# Patient Record
Sex: Male | Born: 1955 | Race: Black or African American | Hispanic: No | Marital: Single | Smoking: Former smoker
Health system: Southern US, Community
[De-identification: ages and names within clinical notes are randomized; demographics above are authoritative.]

## PROBLEM LIST (undated history)

## (undated) DIAGNOSIS — F209 Schizophrenia, unspecified: Secondary | ICD-10-CM

## (undated) DIAGNOSIS — E119 Type 2 diabetes mellitus without complications: Secondary | ICD-10-CM

## (undated) DIAGNOSIS — J45909 Unspecified asthma, uncomplicated: Secondary | ICD-10-CM

## (undated) DIAGNOSIS — E1101 Type 2 diabetes mellitus with hyperosmolarity with coma: Secondary | ICD-10-CM

## (undated) DIAGNOSIS — M109 Gout, unspecified: Secondary | ICD-10-CM

## (undated) DIAGNOSIS — D649 Anemia, unspecified: Secondary | ICD-10-CM

## (undated) DIAGNOSIS — I10 Essential (primary) hypertension: Secondary | ICD-10-CM

## (undated) DIAGNOSIS — E785 Hyperlipidemia, unspecified: Secondary | ICD-10-CM

## (undated) HISTORY — PX: TOE SURGERY: SHX1073

## (undated) HISTORY — DX: Type 2 diabetes mellitus without complications: E11.9

## (undated) HISTORY — PX: REPLACEMENT TOTAL KNEE: SUR1224

## (undated) HISTORY — DX: Type 2 diabetes mellitus with hyperosmolarity with coma: E11.01

## (undated) HISTORY — DX: Unspecified asthma, uncomplicated: J45.909

---

## 2016-05-26 ENCOUNTER — Ambulatory Visit (INDEPENDENT_AMBULATORY_CARE_PROVIDER_SITE_OTHER): Payer: Medicaid Other | Admitting: Family Medicine

## 2016-05-26 ENCOUNTER — Encounter: Payer: Self-pay | Admitting: Family Medicine

## 2016-05-26 VITALS — BP 124/84 | HR 82 | Temp 98.0°F | Ht 74.0 in | Wt 236.0 lb

## 2016-05-26 DIAGNOSIS — Z87898 Personal history of other specified conditions: Secondary | ICD-10-CM

## 2016-05-26 DIAGNOSIS — J4540 Moderate persistent asthma, uncomplicated: Secondary | ICD-10-CM

## 2016-05-26 DIAGNOSIS — Z720 Tobacco use: Secondary | ICD-10-CM | POA: Insufficient documentation

## 2016-05-26 DIAGNOSIS — N189 Chronic kidney disease, unspecified: Secondary | ICD-10-CM | POA: Diagnosis not present

## 2016-05-26 DIAGNOSIS — E1140 Type 2 diabetes mellitus with diabetic neuropathy, unspecified: Secondary | ICD-10-CM | POA: Insufficient documentation

## 2016-05-26 DIAGNOSIS — F1911 Other psychoactive substance abuse, in remission: Secondary | ICD-10-CM | POA: Insufficient documentation

## 2016-05-26 DIAGNOSIS — F1011 Alcohol abuse, in remission: Secondary | ICD-10-CM | POA: Insufficient documentation

## 2016-05-26 DIAGNOSIS — I10 Essential (primary) hypertension: Secondary | ICD-10-CM | POA: Insufficient documentation

## 2016-05-26 DIAGNOSIS — E1139 Type 2 diabetes mellitus with other diabetic ophthalmic complication: Secondary | ICD-10-CM

## 2016-05-26 DIAGNOSIS — D509 Iron deficiency anemia, unspecified: Secondary | ICD-10-CM | POA: Insufficient documentation

## 2016-05-26 DIAGNOSIS — J4530 Mild persistent asthma, uncomplicated: Secondary | ICD-10-CM | POA: Insufficient documentation

## 2016-05-26 LAB — CBC WITH DIFFERENTIAL/PLATELET
BASOS ABS: 57 {cells}/uL (ref 0–200)
Basophils Relative: 1 %
EOS PCT: 2 %
Eosinophils Absolute: 114 cells/uL (ref 15–500)
HCT: 37.1 % — ABNORMAL LOW (ref 38.5–50.0)
HEMOGLOBIN: 11.9 g/dL — AB (ref 13.2–17.1)
LYMPHS ABS: 2850 {cells}/uL (ref 850–3900)
LYMPHS PCT: 50 %
MCH: 28.7 pg (ref 27.0–33.0)
MCHC: 32.1 g/dL (ref 32.0–36.0)
MCV: 89.6 fL (ref 80.0–100.0)
MPV: 9.9 fL (ref 7.5–12.5)
Monocytes Absolute: 456 cells/uL (ref 200–950)
Monocytes Relative: 8 %
NEUTROS PCT: 39 %
Neutro Abs: 2223 cells/uL (ref 1500–7800)
Platelets: 268 10*3/uL (ref 140–400)
RBC: 4.14 MIL/uL — AB (ref 4.20–5.80)
RDW: 14.1 % (ref 11.0–15.0)
WBC: 5.7 10*3/uL (ref 3.8–10.8)

## 2016-05-26 LAB — COMPLETE METABOLIC PANEL WITH GFR
ALBUMIN: 3.9 g/dL (ref 3.6–5.1)
ALK PHOS: 39 U/L — AB (ref 40–115)
ALT: 15 U/L (ref 9–46)
AST: 18 U/L (ref 10–35)
BUN: 19 mg/dL (ref 7–25)
CHLORIDE: 109 mmol/L (ref 98–110)
CO2: 24 mmol/L (ref 20–31)
Calcium: 9.7 mg/dL (ref 8.6–10.3)
Creat: 1.22 mg/dL (ref 0.70–1.33)
GFR, EST NON AFRICAN AMERICAN: 64 mL/min (ref >=60)
GFR, Est African American: 75 mL/min (ref >=60)
GLUCOSE: 91 mg/dL (ref 65–99)
POTASSIUM: 5 mmol/L (ref 3.5–5.3)
SODIUM: 141 mmol/L (ref 135–146)
Total Bilirubin: 0.3 mg/dL (ref 0.2–1.2)
Total Protein: 6.9 g/dL (ref 6.1–8.1)

## 2016-05-26 LAB — POCT GLYCOSYLATED HEMOGLOBIN (HGB A1C): HEMOGLOBIN A1C: 9.7

## 2016-05-26 MED ORDER — DAILY MULTIPLE VITAMINS PO TABS: 1.0000 | ORAL_TABLET | Freq: Every day | ORAL | 11 refills | Status: DC

## 2016-05-26 MED ORDER — FERROUS SULFATE 325 (65 FE) MG PO TABS: 325.0000 mg | ORAL_TABLET | Freq: Every day | ORAL | 3 refills | Status: DC

## 2016-05-26 MED ORDER — MONTELUKAST SODIUM 10 MG PO TABS: 10.0000 mg | ORAL_TABLET | Freq: Every day | ORAL | 3 refills | Status: DC

## 2016-05-26 MED ORDER — RAMIPRIL 5 MG PO CAPS
5.0000 mg | ORAL_CAPSULE | Freq: Every day | ORAL | 3 refills | Status: DC
Start: 2016-05-26 — End: 2016-07-19

## 2016-05-26 MED ORDER — FREESTYLE LANCETS MISC: 12 refills | Status: DC

## 2016-05-26 MED ORDER — OMEPRAZOLE 20 MG PO CPDR: 20.0000 mg | DELAYED_RELEASE_CAPSULE | Freq: Every day | ORAL | 3 refills | Status: DC

## 2016-05-26 MED ORDER — CETIRIZINE HCL 10 MG PO TABS: 10.0000 mg | ORAL_TABLET | Freq: Every day | ORAL | 11 refills | Status: DC

## 2016-05-26 MED ORDER — GLIMEPIRIDE 4 MG PO TABS: 4.0000 mg | ORAL_TABLET | Freq: Two times a day (BID) | ORAL | 3 refills | Status: DC

## 2016-05-26 MED ORDER — AMLODIPINE BESYLATE 5 MG PO TABS: 5.0000 mg | ORAL_TABLET | Freq: Every day | ORAL | 3 refills | Status: DC

## 2016-05-26 MED ORDER — ATORVASTATIN CALCIUM 20 MG PO TABS: 20.0000 mg | ORAL_TABLET | Freq: Every day | ORAL | 3 refills | Status: DC

## 2016-05-26 MED ORDER — GLUCOSE BLOOD VI STRP: ORAL_STRIP | 12 refills | Status: DC

## 2016-05-26 MED ORDER — ASPIRIN EC 81 MG PO TBEC: 81.0000 mg | DELAYED_RELEASE_TABLET | Freq: Every day | ORAL | 3 refills | Status: DC

## 2016-05-26 NOTE — Progress Notes (Signed)
    Subjective:    Patient ID: Dylan Jordan, male    DOB: 05-05-1956, 61 y.o.   MRN: [id number]   CC: here to establish care, needs refills  Doing well overall. Has chronic back pain. Recently moved here end of December. Sees [facility] for mental health issues related to past substance abuse. Sees them next week. They fill his psych medications.  Has diabetes and take sugars twice a day, used to be on insulin but not anymore. Sugars have been well controlled (130's) for past several weeks. They were not well controlled during holidays. He has poor vision related to diabetes and saw optometry and ophthalmology in [place] for this. He is unsure if his kidneys have been affected by DM but thinks his creatinine may be elevated. His A1C in past has been 8 or 9. He has some numbness and tingling in feet.   PMH- diabetes, HTN, anemia, HTN, asthma, alcohol abuse, hx of substance abuse meds- see med list (extensive) allg- seasonal, soybeans? Pineapple, chocolate Surg Hx- left knee replacement, right toe spur SH- smokes, no alcohol (5 years sober), hx of drug abuse- cocaine, THC, psychedelics. lives with niece who is nurse FH- Dm, heart disease  Smoking status reviewed- 3/4 pack a day, has smoked for 41 years.  Review of Systems- no fevers or chills   Objective:  BP 124/84   Pulse 82   Temp 98 F (36.7 C) (Oral)   Ht 6\' 2"  (1.88 m)   Wt 236 lb (107 kg)   BMI 30.30 kg/m  Vitals and nursing note reviewed  General: well nourished, in no acute distress Neck: supple, non-tender, without lymphadenopathy Cardiac: RRR, clear S1 and S2, no murmurs, rubs, or gallops Respiratory: clear to auscultation bilaterally, no increased work of breathing Abdomen: obese abdomen. Soft, nontender, nondistended, +BS Extremities: no edema or cyanosis. Warm, well perfused.  Neuro: alert and oriented, speech is difficult to understand at times, slow speech, answers questions appropriately.    Assessment & Plan:     HTN (hypertension) BP well controlled today.  -refilled norvasc 5 mg -follow up 2-3 weeks  Type 2 diabetes mellitus with diabetic neuropathy (HCC)  On glimepiride 4mg  BID. Sugars appear well controlled. None over 200. None under 100.  -refilled glimepiride -check A1C today -follow up 2-3 weeks -will refer to eye doctor  Iron deficiency anemia  Has been taking iron daily for several years.  -CBC today -refilled iron -follow up 2-3 weeks  Refilled all other medications. Will follow up on other chronic medical conditions at next visit. Return in about 3 weeks (around 06/16/2016).   Dylan PattyAngela Stalin Gruenberg, DO Family Medicine Resident PGY-1

## 2016-05-26 NOTE — Assessment & Plan Note (Signed)
  Has been taking iron daily for several years.  -CBC today -refilled iron -follow up 2-3 weeks

## 2016-05-26 NOTE — Assessment & Plan Note (Addendum)
  On glimepiride 4mg  BID. Sugars appear well controlled. None over 200. None under 100.  -refilled glimepiride -check A1C today -follow up 2-3 weeks -will refer to eye doctor

## 2016-05-26 NOTE — Patient Instructions (Signed)
  It was great meeting you today!  I refilled your medications. I'd like to see you back in 3 weeks to follow up on lab tests.  We will check your blood counts, electrolytes, kidney function, and A1C today.  Please call with any questions or concerns.

## 2016-05-26 NOTE — Assessment & Plan Note (Signed)
BP well controlled today.  -refilled norvasc 5 mg -follow up 2-3 weeks

## 2016-05-26 NOTE — Progress Notes (Deleted)
    Subjective:    Patient ID: Parthenia Ameshilip Kelleher, male    DOB: 06-Aug-1955, 61 y.o.   MRN: [id number]   CC: here to establish   HPI:   Smoking status reviewed  Review of Systems   Objective:  BP 124/84   Pulse 82   Temp 98 F (36.7 C) (Oral)   Ht 6\' 2"  (1.88 m)   Wt 236 lb (107 kg)   BMI 30.30 kg/m  Vitals and nursing note reviewed  General: well nourished, in no acute distress HEENT: normocephalic, TM's visualized bilaterally, no scleral icterus or conjunctival pallor, no nasal discharge, moist mucous membranes, good dentition without erythema or discharge noted in posterior oropharynx Neck: supple, non-tender, without lymphadenopathy Cardiac: RRR, clear S1 and S2, no murmurs, rubs, or gallops Respiratory: clear to auscultation bilaterally, no increased work of breathing Abdomen: soft, nontender, nondistended, no masses or organomegaly. Bowel sounds present Extremities: no edema or cyanosis. Warm, well perfused. 2+ radial and PT pulses bilaterally Skin: warm and dry, no rashes noted Neuro: alert and oriented, no focal deficits   Assessment & Plan:    No problem-specific Assessment & Plan notes found for this encounter.    Return in about 3 weeks (around 06/16/2016).   [name], DO Family Medicine Resident PGY-1

## 2016-05-29 ENCOUNTER — Telehealth: Payer: Self-pay | Admitting: *Deleted

## 2016-05-29 NOTE — Telephone Encounter (Signed)
[facility] stating patient's insurance [employer](Medicaid) will cover Accu-check Aviva Plus testing supplies. Clovis PuMartin, Zayley Arras L, RN

## 2016-05-30 ENCOUNTER — Other Ambulatory Visit: Payer: Self-pay | Admitting: Family Medicine

## 2016-05-30 MED ORDER — ACCU-CHEK AVIVA PLUS W/DEVICE KIT: PACK | 0 refills | Status: DC

## 2016-05-30 MED ORDER — GLUCOSE BLOOD VI STRP: ORAL_STRIP | 12 refills | Status: DC

## 2016-05-30 MED ORDER — ACCU-CHEK FASTCLIX LANCETS MISC: 12 refills | Status: DC

## 2016-06-09 LAB — HM DIABETES EYE EXAM

## 2016-06-12 ENCOUNTER — Ambulatory Visit: Payer: Self-pay | Admitting: Family Medicine

## 2016-06-14 ENCOUNTER — Ambulatory Visit (INDEPENDENT_AMBULATORY_CARE_PROVIDER_SITE_OTHER): Payer: Medicaid Other | Admitting: Family Medicine

## 2016-06-14 ENCOUNTER — Encounter: Payer: Self-pay | Admitting: Family Medicine

## 2016-06-14 VITALS — BP 130/90 | HR 74 | Temp 98.1°F | Ht 74.0 in | Wt 233.0 lb

## 2016-06-14 DIAGNOSIS — E1140 Type 2 diabetes mellitus with diabetic neuropathy, unspecified: Secondary | ICD-10-CM | POA: Diagnosis present

## 2016-06-14 DIAGNOSIS — Z720 Tobacco use: Secondary | ICD-10-CM

## 2016-06-14 DIAGNOSIS — F1994 Other psychoactive substance use, unspecified with psychoactive substance-induced mood disorder: Secondary | ICD-10-CM | POA: Diagnosis not present

## 2016-06-14 MED ORDER — NICOTINE 21 MG/24HR TD PT24: 21.0000 mg | MEDICATED_PATCH | Freq: Every day | TRANSDERMAL | 3 refills | Status: DC

## 2016-06-14 MED ORDER — METFORMIN HCL 500 MG PO TABS: 1000.0000 mg | ORAL_TABLET | Freq: Two times a day (BID) | ORAL | 3 refills | Status: DC

## 2016-06-14 NOTE — Assessment & Plan Note (Addendum)
  Patient reluctant to add insulin at this time. He would prefer to work on improving his diet.   -continue metformin and amaryl for now -recheck A1C in 3 months, patient agreed to add medication at that time if it does not improve -information given about dietary changes he can work on  -referral to podiatry given

## 2016-06-14 NOTE — Patient Instructions (Signed)
  It was good seeing you today!   [facility].  Please come back in 3 months and we can talk more about your diabetes control.  In the meantime, try to work on your diet.   Diet Recommendations for Diabetes   Starchy (carb) foods: Bread, rice, pasta, potatoes, corn, cereal, grits, crackers, bagels, muffins, all baked goods.  (Fruits, milk, and yogurt also have carbohydrate, but most of these foods will not spike your blood sugar as the starchy foods will.)  A few fruits do cause high blood sugars; use small portions of bananas (limit to 1/2 at a time), grapes, watermelon, oranges, and most tropical fruits.    Protein foods: Meat, fish, poultry, eggs, dairy foods, and beans such as pinto and kidney beans (beans also provide carbohydrate).   1. Eat at least 3 meals and 1-2 snacks per day. Never go more than 4-5 hours while awake without eating. Eat breakfast within the first hour of getting up.   2. Limit starchy foods to TWO per meal and ONE per snack. ONE portion of a starchy  food is equal to the following:   - ONE slice of bread (or its equivalent, such as half of a hamburger bun).   - 1/2 cup of a "scoopable" starchy food such as potatoes or rice.   - 15 grams of carbohydrate as shown on food label.  3. Include at every meal: a protein food, a carb food, and vegetables and/or fruit.   - Obtain twice the volume of veg's as protein or carbohydrate foods for both lunch and dinner.   - Fresh or frozen veg's are best.   - Keep frozen veg's on hand for a quick vegetable serving.

## 2016-06-14 NOTE — Progress Notes (Signed)
    Subjective:    Patient ID: Parthenia Ameshilip Gerding, male    DOB: Mar 25, 1956, 61 y.o.   MRN: [id number]   CC: here for follow up on DM  DM Has been taking metformin and amaryl as directed every day. Trying to eat healthier.  A1C was 9.7. He said his numbers have been below 200 consistently. Had a low of 50 one night. He woke up feeling shaky and off. He is hesitant to adding more medication at this time to improve A1C. He had been on insulin previously and hated sticking himself. He would prefer to continue trying to improve his diet. Needs referral to podiatry, saw one frequently in [place] for diabetic foot check ups/callous removal/nail trimming.  Tobacco abuse Currently trying to quit, using nicotine patches to quit. 2 Cigarettes in past 3 weeks. Has been smoking 1-1.5 packs per day for 41 years. He lives with his niece who is also trying to quit. Has had cough with white sputum recently. No fevers. No body aches, chills. Has been taking mucinex with relief. Coughing more at night. Feels it may be related to him quitting smoking and "bringing up all the carcinogens"   Review of Systems- see HPI   Objective:  BP 130/90 (BP Location: Left Arm, Patient Position: Sitting, Cuff Size: Normal)   Pulse 74   Temp 98.1 F (36.7 C) (Oral)   Ht 6\' 2"  (1.88 m)   Wt 233 lb (105.7 kg)   SpO2 97%   BMI 29.92 kg/m  Vitals and nursing note reviewed  General: well nourished, in no acute distress Neck: supple, non-tender, without lymphadenopathy Cardiac: RRR, clear S1 and S2, no murmurs, rubs, or gallops Respiratory: clear to auscultation bilaterally, no increased work of breathing Abdomen: obese abdomen, non-tender, non-distended, +BS Extremities: no edema or cyanosis Neuro: alert and oriented, no focal deficits Psych: speech garbled at times, talkative, good eye contact.    Assessment & Plan:    Drug-induced mood disorder (HCC)  Sees [name] for psych meds refill  -[facility] requested lithium and  depakote level today, will order and fax them results -he has appt with them end of February  Type 2 diabetes mellitus with diabetic neuropathy (HCC)  Patient reluctant to add insulin at this time. He would prefer to work on improving his diet.   -continue metformin and amaryl for now -recheck A1C in 3 months, patient agreed to add medication at that time if it does not improve -information given about dietary changes he can work on  -referral to podiatry given  Tobacco abuse  Currently using patch to quit with a lot of success  -patient interested in seeing Dr. Raymondo BandKoval for help with smoking cessation -sent in Rx for nicotine patches     Return in about 3 months (around 09/11/2016).   Dolores PattyAngela Kaetlin Bullen, DO Family Medicine Resident PGY-1

## 2016-06-14 NOTE — Assessment & Plan Note (Signed)
  Sees [name] for psych meds refill  -[facility] requested lithium and depakote level today, will order and fax them results -he has appt with them end of February

## 2016-06-14 NOTE — Assessment & Plan Note (Signed)
  Currently using patch to quit with a lot of success  -patient interested in seeing Dr. Raymondo BandKoval for help with smoking cessation -sent in Rx for nicotine patches

## 2016-06-15 LAB — LITHIUM LEVEL: Lithium Lvl: 0.8 mmol/L (ref 0.6–1.2)

## 2016-06-15 LAB — VALPROIC ACID LEVEL: Valproic Acid Lvl: 50.6 ug/mL (ref 50.0–100.0)

## 2016-06-16 ENCOUNTER — Telehealth: Payer: Self-pay | Admitting: Family Medicine

## 2016-06-16 NOTE — Telephone Encounter (Signed)
The lancets prescribed were not the correct type.  He doesn't know what type he has.  He has the correct test strips and glucometer.  Please advise

## 2016-06-17 ENCOUNTER — Other Ambulatory Visit: Payer: Self-pay | Admitting: Family Medicine

## 2016-06-17 MED ORDER — ACCU-CHEK SOFT TOUCH LANCETS MISC: 12 refills | Status: DC

## 2016-06-30 ENCOUNTER — Encounter: Payer: Self-pay | Admitting: Student

## 2016-06-30 ENCOUNTER — Ambulatory Visit (INDEPENDENT_AMBULATORY_CARE_PROVIDER_SITE_OTHER): Payer: Medicaid Other | Admitting: Student

## 2016-06-30 DIAGNOSIS — R053 Chronic cough: Secondary | ICD-10-CM | POA: Insufficient documentation

## 2016-06-30 DIAGNOSIS — R05 Cough: Secondary | ICD-10-CM | POA: Insufficient documentation

## 2016-06-30 MED ORDER — OMEPRAZOLE 20 MG PO CPDR: 40.0000 mg | DELAYED_RELEASE_CAPSULE | Freq: Every day | ORAL | 1 refills | Status: DC

## 2016-06-30 NOTE — Progress Notes (Signed)
   Subjective:    Patient ID: Dylan Jordan, male    DOB: 1955/07/02, 61 y.o.   MRN: [id number]   CC: chronic cough  HPI: 61 y/o M presents for chronic cough  Cough - present for " years" - occasionally productive of phlegn and food particles - he reports a history of GERD and takes protonix for this - denies SOB or chest pain - denies fever - denies hemoptysis  Smoking status reviewed Current smoker but trying to quit with the patch  Review of Systems  Per HPI N/V/D, weakness    Objective:  BP 122/70   Pulse 83   Temp 98.1 F (36.7 C) (Oral)   Ht 6\' 2"  (1.88 m)   Wt 237 lb (107.5 kg)   SpO2 96%   BMI 30.43 kg/m  Vitals and nursing note reviewed  General: NAD Cardiac: RRR Respiratory: CTAB, normal effortt Extremities: no edema or cyanosis. WWP. Skin: warm and dry, no rashes noted Neuro: alert and oriented, no focal deficits   Assessment & Plan:    Chronic cough Given history of GERD, cough is likely due to reflux disease. No fevers, SOB and hemoptysis makes infectious cause less likely. He is currently on Protonic 20 and while he did not take it yesterday, he does feel he typically takes it as directed - will increase protonix to 40 qD - follow with PCP    Shiquita Collignon A. Kennon RoundsHaney MD, MS Family Medicine Resident PGY-3 Pager [phone]

## 2016-06-30 NOTE — Patient Instructions (Signed)
Follow up in 2 weeks for cough If you continue to have questions or concerns, call the office at [phone]

## 2016-06-30 NOTE — Assessment & Plan Note (Addendum)
Given history of GERD, cough is likely due to reflux disease. No fevers, SOB and hemoptysis makes infectious cause less likely. He is currently on Protonic 20 and while he did not take it yesterday, he does feel he typically takes it as directed - will increase protonix to 40 qD - follow with PCP

## 2016-07-05 ENCOUNTER — Other Ambulatory Visit: Payer: Self-pay | Admitting: *Deleted

## 2016-07-05 MED ORDER — ACCU-CHEK FASTCLIX LANCETS MISC: 12 refills | Status: DC

## 2016-07-05 MED ORDER — ACCU-CHEK SOFTCLIX LANCET DEV KIT: PACK | 0 refills | Status: DC

## 2016-07-05 NOTE — Progress Notes (Unsigned)
Patient called needing lancet device sent to pharmacy.  Accu-chek softclix lancet device sent to pharmacy.  Clovis PuMartin, Tamika L, RN

## 2016-07-05 NOTE — Telephone Encounter (Signed)
Pt states he lost the lancet and needs a new one as soon as possible. Funds are tight and pt is hoping to get it for free or small cost. ep

## 2016-07-05 NOTE — Telephone Encounter (Signed)
Pt called again and [facility] [address] ep

## 2016-07-07 ENCOUNTER — Other Ambulatory Visit: Payer: Self-pay | Admitting: Family Medicine

## 2016-07-10 ENCOUNTER — Ambulatory Visit: Payer: Medicaid Other | Admitting: Podiatry

## 2016-07-12 ENCOUNTER — Ambulatory Visit: Payer: Medicaid Other | Admitting: Podiatry

## 2016-07-14 ENCOUNTER — Other Ambulatory Visit: Payer: Self-pay | Admitting: Family Medicine

## 2016-07-14 MED ORDER — DAILY MULTIPLE VITAMINS PO TABS: 1.0000 | ORAL_TABLET | Freq: Every day | ORAL | 11 refills | Status: DC

## 2016-07-14 MED ORDER — ATORVASTATIN CALCIUM 20 MG PO TABS: 20.0000 mg | ORAL_TABLET | Freq: Every day | ORAL | 3 refills | Status: DC

## 2016-07-14 MED ORDER — AMLODIPINE BESYLATE 5 MG PO TABS: 5.0000 mg | ORAL_TABLET | Freq: Every day | ORAL | 3 refills | Status: DC

## 2016-07-14 NOTE — Telephone Encounter (Signed)
Will forward to MD.  Patient states that he will not take the ramipril until he sees his provider on 07-19-16. Kristine Tiley,CMA

## 2016-07-14 NOTE — Telephone Encounter (Signed)
Pt called because he is having a lot of coughing, breathing and gagging issues with the Ramipril. He is not going to take the Ramipril until he speaks to someone from our office. He also needs refills on his Lipitor, Amlodipine, Vitamin D , and his Daily Multi-vitamins. Please call so that the patient know what to do about the Ramipril. jw

## 2016-07-14 NOTE — Telephone Encounter (Signed)
Refilled medications. Will discuss ramipril at upcoming visit. Ok with him not taking this in the meantime. Thanks

## 2016-07-14 NOTE — Telephone Encounter (Signed)
Pt is continuing to have a dry cough.  His niece who has her masters in nurseing suggested ramipril may be causing his cough. He has an appt with ricco on wed. Should he continue to take this medicine?  Please advise

## 2016-07-14 NOTE — Telephone Encounter (Signed)
Will forward to MD. Glenn Gullickson,CMA  

## 2016-07-17 ENCOUNTER — Ambulatory Visit (INDEPENDENT_AMBULATORY_CARE_PROVIDER_SITE_OTHER): Payer: Medicaid Other | Admitting: Podiatry

## 2016-07-17 DIAGNOSIS — L603 Nail dystrophy: Secondary | ICD-10-CM

## 2016-07-17 DIAGNOSIS — L608 Other nail disorders: Secondary | ICD-10-CM

## 2016-07-17 DIAGNOSIS — M79676 Pain in unspecified toe(s): Secondary | ICD-10-CM | POA: Diagnosis not present

## 2016-07-17 DIAGNOSIS — B351 Tinea unguium: Secondary | ICD-10-CM | POA: Diagnosis not present

## 2016-07-17 DIAGNOSIS — E0843 Diabetes mellitus due to underlying condition with diabetic autonomic (poly)neuropathy: Secondary | ICD-10-CM | POA: Diagnosis not present

## 2016-07-17 DIAGNOSIS — M79609 Pain in unspecified limb: Principal | ICD-10-CM

## 2016-07-17 NOTE — Progress Notes (Signed)
   SUBJECTIVE Patient with a history of diabetes mellitus presents to office today complaining of elongated, thickened nails. Pain while ambulating in shoes. Patient is unable to trim their own nails.   OBJECTIVE General Patient is awake, alert, and oriented x 3 and in no acute distress. Derm Skin is dry and supple bilateral. Negative open lesions or macerations. Remaining integument unremarkable. Nails are tender, long, thickened and dystrophic with subungual debris, consistent with onychomycosis, 1-5 bilateral. No signs of infection noted. Vasc  DP and PT pedal pulses palpable bilaterally. Temperature gradient within normal limits.  Neuro Epicritic and protective threshold sensation diminished bilaterally.  Musculoskeletal Exam No symptomatic pedal deformities noted bilateral. Muscular strength within normal limits.  ASSESSMENT 1. Diabetes Mellitus w/ peripheral neuropathy 2. Onychomycosis of nail due to dermatophyte bilateral 3. Pain in foot bilateral  PLAN OF CARE 1. Patient evaluated today. 2. Instructed to maintain good pedal hygiene and foot care. Stressed importance of controlling blood sugar.  3. Mechanical debridement of nails 1-5 bilaterally performed using a nail nipper. Filed with dremel without incident.  4. Return to clinic in 3 mos.     Lynnet Hefley M. Toba Claudio, DPM [facility]  Dr. Breuna Loveall M. Andreah Goheen, DPM    [address]                                        [place], [place] [address]                Office [phone]  Fax [phone]       

## 2016-07-18 ENCOUNTER — Telehealth: Payer: Self-pay | Admitting: *Deleted

## 2016-07-18 ENCOUNTER — Ambulatory Visit: Payer: Medicaid Other | Admitting: Podiatry

## 2016-07-18 NOTE — Telephone Encounter (Addendum)
Pt states he mentioned a cream to Dr. Logan BoresEvans Ammonium Lactate 2%, he would like a large tube with directions to apply to feet twice daily, pt's new pharmacy phone is [phone]. 07/25/2016-I informed pt of Dr. Logan BoresEvans recommendation, pt states he wants a lotion Medicaid pays for.07/26/2016-The ammonium lactate % that pt states is not in the formulary of [employer]. I asked the % of the cream and he states 12%, I told him [facility]

## 2016-07-19 ENCOUNTER — Encounter: Payer: Self-pay | Admitting: Licensed Clinical Social Worker

## 2016-07-19 ENCOUNTER — Telehealth: Payer: Self-pay | Admitting: *Deleted

## 2016-07-19 ENCOUNTER — Encounter: Payer: Self-pay | Admitting: Family Medicine

## 2016-07-19 ENCOUNTER — Ambulatory Visit (INDEPENDENT_AMBULATORY_CARE_PROVIDER_SITE_OTHER): Payer: Medicaid Other | Admitting: Family Medicine

## 2016-07-19 VITALS — BP 122/84 | HR 80 | Temp 97.8°F | Ht 74.0 in | Wt 233.8 lb

## 2016-07-19 DIAGNOSIS — R05 Cough: Secondary | ICD-10-CM | POA: Diagnosis not present

## 2016-07-19 DIAGNOSIS — I10 Essential (primary) hypertension: Secondary | ICD-10-CM | POA: Diagnosis not present

## 2016-07-19 DIAGNOSIS — J4540 Moderate persistent asthma, uncomplicated: Secondary | ICD-10-CM

## 2016-07-19 DIAGNOSIS — R053 Chronic cough: Secondary | ICD-10-CM

## 2016-07-19 MED ORDER — OMEPRAZOLE 20 MG PO CPDR
40.0000 mg | DELAYED_RELEASE_CAPSULE | Freq: Every day | ORAL | 2 refills | Status: DC
Start: 2016-07-19 — End: 2016-09-11

## 2016-07-19 MED ORDER — LOSARTAN POTASSIUM 25 MG PO TABS: 25.0000 mg | ORAL_TABLET | Freq: Every day | ORAL | 1 refills | Status: DC

## 2016-07-19 MED ORDER — FLUTICASONE-SALMETEROL 250-50 MCG/DOSE IN AEPB: 1.0000 | INHALATION_SPRAY | Freq: Two times a day (BID) | RESPIRATORY_TRACT | 3 refills | Status: DC

## 2016-07-19 NOTE — Telephone Encounter (Signed)
Form signed but patient will likely not be covered for this medication. Discussed him needing to try Qvar or equivalent first but he was insistent on getting Advair. Has appointment for PFT's 3/12 and if this is indicative of COPD Dr. Raymondo BandKoval may be able to recommend appropriate therapy. Thanks!

## 2016-07-19 NOTE — Telephone Encounter (Signed)
PA for Advair Diskus is pending per [place] [employer].  Clovis PuMartin, Tamika L, RN

## 2016-07-19 NOTE — Telephone Encounter (Signed)
[facility]. Formulary and PA form placed in provider box for completion. Juluis Fitzsimmons L, RN  

## 2016-07-19 NOTE — Patient Instructions (Addendum)
  It was great seeing you today!  I changed your Ramipril to a medicine called Losartan. This medicine does not have the side of effect of cough. I sent this to your pharmacy.   Continue taking Prilosec 40 mg every night.  Please use your albuterol inhaler as needed and start using Advair twice a day.  You will see Dr. Raymondo BandKoval Monday the 12th at 11:15 am for pulmonary function testing. He will check your blood pressure at this time. This will answer the COPD question for Dylan Jordan.  Continue the great work on quitting smoking!!  I'll see you in 1 month to follow up on the cough and your diabetes. Call me if you need me in the mean time.

## 2016-07-19 NOTE — Assessment & Plan Note (Addendum)
Has had cough for years. Worsening over past 2-3 months. Hx of asthma. Differential includes uncontrolled asthma, drug induced cough, untreated COPD, allergies/post-nasal drip, and GERD.  -Discontinued ramipril -Start ARB- losartan rx sent to pharmacy -Continue taking 40 mg prilosec every night -PFT's scheduled for 3/12 -Continue smoking cessation -referral made to allergist per patient request

## 2016-07-19 NOTE — Assessment & Plan Note (Addendum)
Patient using albuterol without much relief. Requesting resuming Advair. Recommended starting with Qvar but patient was insistent on going for Advair first.  -advair rx sent in -scheduled for PFT's in pharmacy clinic 3/12 -[name] consulted for difficulty affording medications, see Gavin PoundDeborah Moore's note

## 2016-07-19 NOTE — Progress Notes (Addendum)
Parthenia Ameshilip Duan is a 61 y.o. male   [name]:  Community resources. Pt. reports the following concerns : difficultaffording his medication and need for transportation.    Patient has medicaid but states unable to afford the co-pays.  Income is $750 per month.  States he uses Medicaid transportation to his appointments but wanted additional transportation resources.   THE FOLLOW WAS DISCUSSED: community resources, 211 services and Patient being able to pick up his medication if he communicates to the pharmacy that he does not have the co-pay they should give him the medicine.    LCSW provided: Reflective listening  and brochures with [facility] [place] resources  PLAN:  1. Patient will contact 211 for additional community resources as needed.  2. He will also inform the pharmacy when he does not have the co-pay   Sammuel Hineseborah Corben Auzenne, LCSW Licensed Clinical Social Worker [facility]   [phone] 2:33 PM  Warm Hand Off Completed.

## 2016-07-19 NOTE — Progress Notes (Signed)
    Subjective:    Patient ID: Dylan Jordan, male    DOB: [birth year], 61 y.o.   MRN: [id number]   CC: cough  HPI: Has had cough for years. Recently increased protonix to 40 mg daily but no change in cough.  Trying to quit smoking with patch, some days he does not smoke any, some days 1-3 cigarettes. Varies. Has gone whole weeks without any cigarettes. Patch is working well to quell cravings.  He is worried he has COPD as in the past he was told PFTs were abnormal but then repeat testing they were normal. Used to use Advair but this was expensive. He uses albuterol as needed but it has not improved the cough at all. He is having nighttime awakenings almost every night. Coughing causes SOB but it resolves once coughing spell is over.  Concerned ace inhibitor is causing cough. Stopped taking it one week ago ago. Symptoms are persisting but pharmacist told him it should be clearing up in a week.   Worried about his various food allergies that have seemed to be changing over the years plus worsening seasonal allergies.   Denies chest pain, SOB at rest or with exertion, fevers, and chills.   Objective:  BP 122/84   Pulse 80   Temp 97.8 F (36.6 C) (Oral)   Ht 6\' 2"  (1.88 m)   Wt 233 lb 12.8 oz (106.1 kg)   SpO2 99%   BMI 30.02 kg/m  Vitals and nursing note reviewed  General: well nourished, in no acute distress HEENT: normocephalic, no scleral icterus or conjunctival pallor, no nasal discharge, moist mucous membranes, good dentition Cardiac: RRR, clear S1 and S2, no murmurs, rubs, or gallops Respiratory: clear to auscultation bilaterally, no increased work of breathing Abdomen: soft, nontender, nondistended Extremities: no edema or cyanosis. Skin: warm and dry, no rashes noted Neuro: alert and oriented, no focal deficits   Assessment & Plan:    Chronic cough Has had cough for years. Worsening over past 2-3 months. Hx of asthma. Differential includes uncontrolled asthma, drug  induced cough, untreated COPD, allergies/post-nasal drip, and GERD.  -Discontinued ramipril -Start ARB- losartan rx sent to pharmacy -Continue taking 40 mg prilosec every night -PFT's scheduled for 3/12 -Continue smoking cessation -referral made to allergist per patient request   Asthma Patient using albuterol without much relief. Requesting resuming Advair. Recommended starting with Qvar but patient was insistent on going for Advair first.  -advair rx sent in -scheduled for PFT's in pharmacy clinic 3/12 -[name] consulted for difficulty affording medications, see Gavin PoundDeborah Moore's note  HTN (hypertension) BP 122/84 today  -switched ACE to ARB due to cough -check BP at upcoming pharmacy visit -will follow up in 1 month    Return in about 4 weeks (around 08/16/2016).   Dolores PattyAngela Nickola Lenig, DO Family Medicine Resident PGY-1

## 2016-07-19 NOTE — Assessment & Plan Note (Signed)
BP 122/84 today  -switched ACE to ARB due to cough -check BP at upcoming pharmacy visit -will follow up in 1 month

## 2016-07-20 NOTE — Telephone Encounter (Signed)
PA for Advair Diskus was denied via [place] [employer].  Confirmation number: [id number] Charmayne SheerW.  Silva Aamodt L, RN

## 2016-07-24 ENCOUNTER — Encounter: Payer: Self-pay | Admitting: Pharmacist

## 2016-07-24 ENCOUNTER — Ambulatory Visit (INDEPENDENT_AMBULATORY_CARE_PROVIDER_SITE_OTHER): Payer: Medicaid Other | Admitting: Pharmacist

## 2016-07-24 DIAGNOSIS — R05 Cough: Secondary | ICD-10-CM

## 2016-07-24 DIAGNOSIS — E1140 Type 2 diabetes mellitus with diabetic neuropathy, unspecified: Secondary | ICD-10-CM | POA: Diagnosis present

## 2016-07-24 DIAGNOSIS — R053 Chronic cough: Secondary | ICD-10-CM

## 2016-07-24 DIAGNOSIS — Z720 Tobacco use: Secondary | ICD-10-CM

## 2016-07-24 MED ORDER — GLIMEPIRIDE 4 MG PO TABS: 2.0000 mg | ORAL_TABLET | Freq: Two times a day (BID) | ORAL | 3 refills | Status: DC

## 2016-07-24 NOTE — Telephone Encounter (Signed)
No, not specifically. I generally recommend Amlactin Lotion on occasion to patients. This is over the counter.  Thanks, Dr. Logan BoresEvans

## 2016-07-24 NOTE — Assessment & Plan Note (Signed)
Set smoking quit date of March 31st. Continue nicotine 21 mg patch.

## 2016-07-24 NOTE — Progress Notes (Signed)
Patient ID: Dylan Jordan, male   DOB: 18-Jun-1955, 61 y.o.   MRN: [id number] Reviewed: Agree with Dr. Macky LowerKoval's documentation and management.

## 2016-07-24 NOTE — Patient Instructions (Addendum)
Thank you for coming to see us today!  The lung function test that you did today did not indicate that you have COPD. The main goal going forward is to completely quit smoking. You told us that you think you can quit smoking completely by March 31st. At this time we will not add any medications for your breathing.   Going forward you should take one half tablet (2 mg) of Amaryl twice a day instead of taking the previous dose of one tablet (4 mg) twice a day. This change should help reduce the amount of times that you have low blood sugars.

## 2016-07-24 NOTE — Assessment & Plan Note (Signed)
Spirometry reveals near normal lung function with increased lung age likely related to significant smoking history. Set smoking quit date of March 31st. Continue nicotine 21 mg patch. Cough should subside once patient stops smoking and from discontinuation of ramipril. Repeat PFTs in 6 months after patient has quit smoking. Continue albuterol inhaler PRN.

## 2016-07-24 NOTE — Progress Notes (Signed)
   S:    Patient arrives in good spirits ambulating without assist. No breathing difficulties apparent. Presents for lung function evaluation. Patient was referred on 07/19/2016 by Dr. Wonda Oldsiccio.   Patient reports breathing has been affected by a persistent cough. Cough has improved slightly since increasing omperazole from 20mg /d to 40 mg/d in addition to discontinuing ramipril. Patient reports that he does not currently take any COPD medications. 45 year smoking history of 1 PPD. Patient has expressed a desire to completely quit smoking by August 12, 2016. Using nicotine 21 mg patch and reports smoking 3 cigarettes/week socially. Patient is motivated and confident in regards to quitting smoking. Patient reports overall "good" control with CBGs of 100-200.  He reports multiple symptomatic low CBGs during sleep or in the morning (as low as 40-60). Patient denies regularly eating breakfast.   O: mMRC score= <2 CAT score= 15 See Documentation Flowsheet - CAT/COPD for complete symptom scoring.  See "scanned report" or Documentation Flowsheet (discrete results - PFTs) for  Spirometry results. Patient provided good effort while attempting spirometry.   Lung Age = 6581  A/P: Spirometry reveals near normal lung function with increased lung age likely related to significant smoking history. Set smoking quit date of March 31st. Continue nicotine 21 mg patch. Cough should subside once patient stops smoking and from discontinuation of ramipril. Repeat PFTs in 6 months after patient has quit smoking. Continue albuterol inhaler PRN.   Due to recurrent low SBGs in the morning reduce glimepiride dose from 4mg  BID to 2 mg BID. Patient understands the drug mechanism and agrees that this is a good course of action.   Reviewed results of pulmonary function tests.  Pt verbalized understanding of results and education.  Written pt instructions provided.  F/U Clinic visit in 2 weeks with Dr. Wonda Oldsiccio and PFTs 6 months after  patient has quit smoking . Total time in face to face counseling 35 minutes.  Patient seen with Elta GuadeloupeJustin Crowder, PharmD Candidate, Coolidge BreezeSarah Mislan, PharmD Candidate.

## 2016-07-24 NOTE — Assessment & Plan Note (Signed)
Due to recurrent low SBGs in the morning reduce glimepiride dose from 4mg  BID to 2 mg BID. Patient understands the drug mechanism and agrees that this is a good course of action.

## 2016-07-25 NOTE — Telephone Encounter (Signed)
Okay 

## 2016-07-26 MED ORDER — AMMONIUM LACTATE 12 % EX CREA: TOPICAL_CREAM | CUTANEOUS | 11 refills | Status: DC | PRN

## 2016-07-28 ENCOUNTER — Telehealth: Payer: Self-pay | Admitting: Family Medicine

## 2016-07-28 NOTE — Telephone Encounter (Signed)
Pt states he quit the ramiprill and is on something new that starts with a N (he does not know the name, says it is a very small pill). Pt states he is having similar side effects like pt did with ramiprill. Chronic cough and an episode of choking. Pt wants Dr. Wonda Oldsiccio to call him within the hour. ep

## 2016-07-28 NOTE — Telephone Encounter (Signed)
  Called [name] back. Had advised him to discontinue ramipril and instead prescribed Cozaar. He is taking PPI and using his inhaler but he is having prolonged coughing with phlegm. The inhaler does not work. He is unsure what is causing this and feels like he should stop taking the new BP medication. He is trying to quit smoking but still has cravings worst in the morning. He is not taking Zyrtec.   He was told in the past he's allergic to seafood but has been eating shrimp and lobster.  Relayed that I think his cough is a combination of his asthma, GERD, allergies, and smoking. I do not think this is due to a medication he is taking.   He denies trouble breathing except when he has bad coughing spells.   Told him that we can either see him in clinic next week or if he feels things are bad enough this weekend and he's having trouble breathing he should go to the ED. He agrees.

## 2016-07-31 ENCOUNTER — Encounter: Payer: Self-pay | Admitting: Family Medicine

## 2016-08-04 ENCOUNTER — Other Ambulatory Visit: Payer: Self-pay | Admitting: *Deleted

## 2016-08-04 ENCOUNTER — Ambulatory Visit (INDEPENDENT_AMBULATORY_CARE_PROVIDER_SITE_OTHER): Payer: Medicaid Other | Admitting: Family Medicine

## 2016-08-04 ENCOUNTER — Encounter: Payer: Self-pay | Admitting: Family Medicine

## 2016-08-04 VITALS — BP 120/70 | HR 74 | Temp 98.4°F | Ht 74.0 in | Wt 234.0 lb

## 2016-08-04 DIAGNOSIS — R05 Cough: Secondary | ICD-10-CM | POA: Diagnosis present

## 2016-08-04 DIAGNOSIS — R053 Chronic cough: Secondary | ICD-10-CM

## 2016-08-04 MED ORDER — FERROUS SULFATE 325 (65 FE) MG PO TABS: 325.0000 mg | ORAL_TABLET | Freq: Every day | ORAL | 3 refills | Status: DC

## 2016-08-04 MED ORDER — ASPIRIN EC 81 MG PO TBEC: 81.0000 mg | DELAYED_RELEASE_TABLET | Freq: Every day | ORAL | 3 refills | Status: DC

## 2016-08-04 NOTE — Telephone Encounter (Signed)
Refill request for 90 day supply.  Martin, Tamika L, RN  

## 2016-08-04 NOTE — Patient Instructions (Signed)
It was great seeing you today! We have addressed the following issues today  1. Please continue taking your singulair daily since your advair was denied byt the insurance company. Take Albuterol as needed. 2. Make sure you stop ramipril. 3. Continue to cut down on smoking. 4. Continue Zyrtec that I already refilled for you. 5. Follow up with your PCP as needed.  If we did any lab work today, and the results require attention, either me or my nurse will get in touch with you. If everything is normal, you will get a letter in mail and a message via . If you don't hear from us in two weeks, please give us a call. Otherwise, we look forward to seeing you again at your next visit. If you have any questions or concerns before then, please call the clinic at [phone].  Please bring all your medications to every doctors visit  Sign up for My Chart to have easy access to your labs results, and communication with your Primary care physician.    Please check-out at the front desk before leaving the clinic.    Take Care,   Dr. Sydnee Cabaliallo

## 2016-08-06 NOTE — Progress Notes (Signed)
   Subjective:    Patient ID: Dylan Jordan, male    DOB: [birth year], 61 y.o.   MRN: [id number]   CC: Follow up for chronic cough  HPI: Dylan Jordan is 61 yo male with a past medical history significant for HTN, T2DM, CKD and asthma who present today to follow up on chronic cough. Patient still endorses dry severe cough that have been very bothersome to him. Patient has recently stopped ramipril (2/16 at PCP request. Patient seemed to have developed cough (November) shortly after starting ACE-I (October). Patient recently had spirometry with Dr.Koval which show normal lung function with increased lung age consistent with extensive history of smoking. Patient has been cutting back on his smoking and his currently on nicotine patch with a quit date set for March 31. Patient has not been taking his Singulair and was told to stop taking it because he was going to be switched to Advair, however insurance has denied advair. Patient also with a history of allergies and currently is supposed to be on zyrtec but was told to stop taking it. Patient has an appointment with allergy and immunology at the end of April. Patient endorses occoasional posttussive emesis, but denies any SOB, fever, chills, abdominal pain and chest pain. Patient seem to be at his baseline, except for cough.  Smoking status reviewed   ROS: all other systems were reviewed and are negative other than in the HPI   No past medical history on file.  No past surgical history on file.   Objective:  BP 120/70   Pulse 74   Temp 98.4 F (36.9 C) (Oral)   Ht 6\' 2"  (1.88 m)   Wt 234 lb (106.1 kg)   SpO2 96%   BMI 30.04 kg/m   Vitals and nursing note reviewed  General: NAD, pleasant, able to participate in exam, few coughing spells noted during exam Cardiac: RRR, normal heart sounds, no murmurs. 2+ radial and PT pulses bilaterally Respiratory: CTAB, normal effort, No wheezes, rales or rhonchi Abdomen: soft, nontender, nondistended,  no hepatic or splenomegaly, +BS Extremities: no edema or cyanosis. WWP. Skin: warm and dry, no rashes noted Neuro: alert and oriented x4, no focal deficits Psych: Normal affect and mood   Assessment & Plan:    #Dry Cough, chronic, stable Patient's dry cough seem to be multifactorial with the major contributing factor being ramipril. Patient stopped the medications about a week ago and would expect improvement in symptoms within a week to 10 days, it could still be too early to see any significant change in cough. Patient seem to be confused about his medications regimen and has not taken singulair or zyrtec which could also be playing a factor in cough. Patient also has asthma and is using albuterol as needed with no increase in use lately. Spirometry not as concerning even with increase lung age. Patient is also still smoking though has significantly decrease daily cigarettes smoked. --Resume Singulair and Zyrtec as prescribed --Smoking cessation reinforced, adherence to quit date discussed --Appointment with Allergy and Immunology April 24 --Albuterol as needed for Asthma --Follow up with PCP, if symptoms worsens   Lovena NeighboursAbdoulaye Sumaiya Arruda, MD Family Medicine Resident PGY-1

## 2016-08-23 ENCOUNTER — Ambulatory Visit: Payer: Medicaid Other | Admitting: Podiatry

## 2016-08-31 ENCOUNTER — Other Ambulatory Visit: Payer: Self-pay | Admitting: Family Medicine

## 2016-08-31 DIAGNOSIS — E1140 Type 2 diabetes mellitus with diabetic neuropathy, unspecified: Secondary | ICD-10-CM

## 2016-09-05 ENCOUNTER — Ambulatory Visit: Payer: Medicaid Other | Admitting: Allergy and Immunology

## 2016-09-06 ENCOUNTER — Ambulatory Visit: Payer: Medicaid Other | Admitting: Podiatry

## 2016-09-07 ENCOUNTER — Other Ambulatory Visit: Payer: Self-pay | Admitting: Family Medicine

## 2016-09-07 ENCOUNTER — Ambulatory Visit: Payer: Medicaid Other | Admitting: Family Medicine

## 2016-09-11 ENCOUNTER — Other Ambulatory Visit: Payer: Self-pay | Admitting: Family Medicine

## 2016-09-12 ENCOUNTER — Ambulatory Visit (INDEPENDENT_AMBULATORY_CARE_PROVIDER_SITE_OTHER): Payer: Medicaid Other | Admitting: Podiatry

## 2016-09-12 ENCOUNTER — Encounter: Payer: Self-pay | Admitting: Podiatry

## 2016-09-12 DIAGNOSIS — B351 Tinea unguium: Secondary | ICD-10-CM

## 2016-09-12 DIAGNOSIS — M79676 Pain in unspecified toe(s): Secondary | ICD-10-CM | POA: Diagnosis not present

## 2016-09-12 DIAGNOSIS — M79609 Pain in unspecified limb: Principal | ICD-10-CM

## 2016-09-12 NOTE — Progress Notes (Signed)
Complaint:  Visit Type: Patient returns to my office for continued preventative foot care services. Complaint: Patient states" my nails have grown long and thick and become painful to walk and wear shoes" The patient presents for preventative foot care services. No changes to ROS  Podiatric Exam: Vascular: dorsalis pedis and posterior tibial pulses are palpable bilateral. Capillary return is immediate. Temperature gradient is WNL. Skin turgor WNL  Sensorium: Normal Semmes Weinstein monofilament test. Normal tactile sensation bilaterally. Nail Exam: Pt has thick disfigured discolored nails with subungual debris noted bilateral entire nail hallux through fifth toenails Ulcer Exam: There is no evidence of ulcer or pre-ulcerative changes or infection. Orthopedic Exam: Muscle tone and strength are WNL. No limitations in general ROM. No crepitus or effusions noted. HAV  B/L. Skin: No Porokeratosis. No infection or ulcers.  Pinch callus hallux  B/L.  Diagnosis:  Onychomycosis, , Pain in right toe, pain in left toes  Treatment & Plan Procedures and Treatment: Consent by patient was obtained for treatment procedures. The patient understood the discussion of treatment and procedures well. All questions were answered thoroughly reviewed. Debridement of mycotic and hypertrophic toenails, 1 through 5 bilateral and clearing of subungual debris. No ulceration, no infection noted.  Return Visit-Office Procedure: Patient instructed to return to the office for a follow up visit when scheduled  for continued evaluation and treatment.    Helane Gunther DPM

## 2016-09-14 ENCOUNTER — Ambulatory Visit (INDEPENDENT_AMBULATORY_CARE_PROVIDER_SITE_OTHER): Payer: Medicaid Other | Admitting: Family Medicine

## 2016-09-14 ENCOUNTER — Encounter: Payer: Self-pay | Admitting: Family Medicine

## 2016-09-14 VITALS — BP 126/68 | HR 84 | Temp 97.7°F | Ht 74.0 in | Wt 235.0 lb

## 2016-09-14 DIAGNOSIS — I10 Essential (primary) hypertension: Secondary | ICD-10-CM

## 2016-09-14 DIAGNOSIS — M545 Low back pain: Secondary | ICD-10-CM | POA: Diagnosis not present

## 2016-09-14 DIAGNOSIS — R05 Cough: Secondary | ICD-10-CM | POA: Diagnosis not present

## 2016-09-14 DIAGNOSIS — Z720 Tobacco use: Secondary | ICD-10-CM

## 2016-09-14 DIAGNOSIS — G8929 Other chronic pain: Secondary | ICD-10-CM

## 2016-09-14 DIAGNOSIS — R053 Chronic cough: Secondary | ICD-10-CM

## 2016-09-14 DIAGNOSIS — E1140 Type 2 diabetes mellitus with diabetic neuropathy, unspecified: Secondary | ICD-10-CM

## 2016-09-14 LAB — POCT GLYCOSYLATED HEMOGLOBIN (HGB A1C): Hemoglobin A1C: 6.3

## 2016-09-14 MED ORDER — GLIMEPIRIDE 1 MG PO TABS: 1.0000 mg | ORAL_TABLET | Freq: Every day | ORAL | 0 refills | Status: DC

## 2016-09-14 MED ORDER — FLUTICASONE PROPIONATE 50 MCG/ACT NA SUSP: 2.0000 | Freq: Every day | NASAL | 6 refills | Status: DC

## 2016-09-14 NOTE — Assessment & Plan Note (Signed)
  Persistent, on PPI and allergy medication. Uses albuterol as needed. Current smoker. Switched ACE to ARB. Cough persists. Unclear etiology.   -appt with allergist May 18 -rx given for Flonase to help with post nasal drip

## 2016-09-14 NOTE — Assessment & Plan Note (Signed)
  Chronic, improving. A1C excellent today at 6.3  -continue 1000 mg metformin BID -decrease amaryl to 1 mg daily

## 2016-09-14 NOTE — Assessment & Plan Note (Signed)
  Continues to smoke daily, using 21 mg nicotine patch with some help  -continue to encourage smoking cessation

## 2016-09-14 NOTE — Patient Instructions (Addendum)
  It was great seeing you today!  Keep up the great work with your diabetes! I'm very proud of the improvement you have made in your A1C.  We'll talk after you see the allergist. Try the flonase for now for your allergies.  Call me if you need anything!  Dolores PattyAngela Brena Windsor, DO PGY-1, [facility] 09/14/2016 3:15 PM

## 2016-09-14 NOTE — Progress Notes (Signed)
    Subjective:    Patient ID: Dylan Jordan, male    DOB: 1955/10/11, 61 y.o.   MRN: [id number]    CC: cough persisting  Cough Still has cough. Drinking cold liquid helps. Albuterol provides no help. Persistent, daily occurrence, sometimes is severe enough for him to feel SOB. Using allergy medicine and PPI twice a day, has not improved cough. Switch to ARB has not improved cough. Normal PFT's done recently. He missed appt with allergist but has this rescheduled for later in May. He does endorse seasonal allergies and runny nose, feels that it is draining down his throat that may be making cough worse. Denies SOB, chest pain, trouble swallowing, metallic taste in mouth.  Smoking Still smoking 1-4 cigarettes every few days, can sometimes go without smoking a whole day. Using Nicotine patch daily 21 mg. Feels that the warm weather/summer will be very hard for him to resist smoking. Endorses urges but the patch keeps this minimal.   DM Has been taking 2 mg amaryl instead of 4 mg due to low sugars. Still having 1-2 low sugars week, in the evening. Will feel lethargic, sluggish, shaky, confused. Lowest 60's. Will get better after eating. Does not want to decrease metformin today he is afraid his sugars will get up too high again. Willing to cut down on amaryl and maybe hopefulyl cut it out entirely soon. No increased thirst or hunger or urination.   Low back pain Chronic, nothing seems to  Help. Has done PT in past but insurance won't cover very many sessions. PT slightly helped. He knows he has a slipped disc (maybe up to 3?) and one is "deteriorating". [place] imaging done in [place] moving. He can get records. Denies numbness, tingling, bladder/bowel incontinence or retention. His sister had surgery for sciatica and she feels improved so he is thinking surgery may work for him too. Wants referral to ortho.   Smoking status reviewed- current smoker, trying to quit  Review of Systems- see  HPI  Objective:  BP 126/68   Pulse 84   Temp 97.7 F (36.5 C) (Oral)   Ht 6\' 2"  (1.88 m)   Wt 235 lb (106.6 kg)   SpO2 97%   BMI 30.17 kg/m  Vitals and nursing note reviewed  General: well nourished, in no acute distress Nose: erythematous and swollen nasal turbinates bilaterally Cardiac: RRR, clear S1 and S2, no murmurs, rubs, or gallops Respiratory: clear to auscultation bilaterally, no increased work of breathing Extremities: no edema or cyanosis Neuro: alert and oriented, no focal deficits  Assessment & Plan:    HTN (hypertension)  Chronic, stable. Currently at goal of <130/80  -continue home BP medications -follow up 3 months   Type 2 diabetes mellitus with diabetic neuropathy (HCC)  Chronic, improving. A1C excellent today at 6.3  -continue 1000 mg metformin BID -decrease amaryl to 1 mg daily   Tobacco abuse  Continues to smoke daily, using 21 mg nicotine patch with some help  -continue to encourage smoking cessation   Chronic cough  Persistent, on PPI and allergy medication. Uses albuterol as needed. Current smoker. Switched ACE to ARB. Cough persists. Unclear etiology.   -appt with allergist May 18 -rx given for Flonase to help with post nasal drip   Low back pain -referral made to orthopedic surgery per patient's request  Return in about 3 months (around 12/15/2016).   [name], DO Family Medicine Resident PGY-1

## 2016-09-14 NOTE — Assessment & Plan Note (Addendum)
  Chronic, stable. Currently at goal of <130/80  -continue home BP medications -follow up 3 months

## 2016-09-15 ENCOUNTER — Encounter: Payer: Self-pay | Admitting: *Deleted

## 2016-09-25 ENCOUNTER — Telehealth: Payer: Self-pay | Admitting: Family Medicine

## 2016-09-25 NOTE — Telephone Encounter (Signed)
Pt states his ear is clogged and would like PCP to call some ear drops into [facility] on [address]. Pt declined to make an appointment. Pt would like to be called after medication has been called in. ep

## 2016-09-26 ENCOUNTER — Ambulatory Visit (INDEPENDENT_AMBULATORY_CARE_PROVIDER_SITE_OTHER): Payer: Medicaid Other | Admitting: Orthopaedic Surgery

## 2016-09-26 ENCOUNTER — Ambulatory Visit (INDEPENDENT_AMBULATORY_CARE_PROVIDER_SITE_OTHER): Payer: Self-pay

## 2016-09-26 ENCOUNTER — Ambulatory Visit (INDEPENDENT_AMBULATORY_CARE_PROVIDER_SITE_OTHER): Payer: Medicaid Other

## 2016-09-26 ENCOUNTER — Encounter (INDEPENDENT_AMBULATORY_CARE_PROVIDER_SITE_OTHER): Payer: Self-pay | Admitting: Orthopaedic Surgery

## 2016-09-26 DIAGNOSIS — M25562 Pain in left knee: Secondary | ICD-10-CM

## 2016-09-26 DIAGNOSIS — G8929 Other chronic pain: Secondary | ICD-10-CM

## 2016-09-26 DIAGNOSIS — M545 Low back pain: Secondary | ICD-10-CM

## 2016-09-26 DIAGNOSIS — M25561 Pain in right knee: Secondary | ICD-10-CM

## 2016-09-26 MED ORDER — DICLOFENAC SODIUM 75 MG PO TBEC: 75.0000 mg | DELAYED_RELEASE_TABLET | Freq: Two times a day (BID) | ORAL | 2 refills | Status: DC

## 2016-09-26 NOTE — Progress Notes (Signed)
Office Visit Note   Patient: Dylan Jordan           Date of Birth: [birth year]           MRN: [id number] Visit Date: 09/26/2016              Requested by: Tillman Sers, DO [address] [place], [place] [address] PCP: Tillman Sers, DO   Assessment & Plan: Visit Diagnoses:  1. Chronic pain of both knees   2. Chronic bilateral low back pain, with sciatica presence unspecified     Plan: Overall he has mild osteoarthrosis of the right knee. His left knee replacement looks stable. 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 Diclofenac was prescribed. Physical therapy prescribed. Follow-up with me as needed. Total face to face encounter time was greater than 45 minutes and over half of this time was spent in counseling and/or coordination of care.    Follow-Up Instructions: Return if symptoms worsen or fail to improve.   Orders:  Orders Placed This Encounter  Procedures  . XR KNEE 3 VIEW RIGHT  . XR KNEE 3 VIEW LEFT  . XR Lumbar Spine 2-3 Views   Meds ordered this encounter  Medications  . diclofenac (VOLTAREN) 75 MG EC tablet    Sig: Take 1 tablet (75 mg total) by mouth 2 (two) times daily.    Dispense:  30 tablet    Refill:  2      Procedures: No procedures performed   Clinical Data: No additional findings.   Subjective: Chief Complaint  Patient presents with  . Right Knee - Pain  . Left Knee - Pain  . Lower Back - Pain    Aneta Mins is a gentleman comes in with bilateral knee pain and low back pain, radicular symptoms. He is status post left total knee replacement years ago in [place]. He says that he has constant tingling burning and numbness throughout the knee and back. Denies any injury denies any focal deficits Denies any bowel or bladder dysfunctions    Review of Systems  Constitutional: Negative.   All other systems reviewed and are negative.    Objective: Vital Signs: There were no vitals taken for this  visit.  Physical Exam  Constitutional: He is oriented to person, place, and time. He appears well-developed and well-nourished.  HENT:  Head: Normocephalic and atraumatic.  Eyes: Pupils are equal, round, and reactive to light.  Neck: Neck supple.  Pulmonary/Chest: Effort normal.  Abdominal: Soft.  Musculoskeletal: Normal range of motion.  Neurological: He is alert and oriented to person, place, and time.  Skin: Skin is warm.  Psychiatric: He has a normal mood and affect. His behavior is normal. Judgment and thought content normal.  Nursing note and vitals reviewed.   Ortho Exam Bilateral knee exam is essentially benign. He has well-healed surgical scar. There is no effusion. Lumbar spine exam is also essentially benign. Some mild discomfort with palpation off to the right side in the lower lumbar segments. No radicular symptoms. Specialty Comments:  No specialty comments available.  Imaging: Xr Knee 3 View Left  Result Date: 09/26/2016 Stable TKA  Xr Knee 3 View Right  Result Date: 09/26/2016 Minimal OA  Xr Lumbar Spine 2-3 Views  Result Date: 09/26/2016 DDD L5-S1, lumbar spondylosis    PMFS History: Patient Active Problem List   Diagnosis Date Noted  . Chronic cough 06/30/2016  . Drug-induced mood disorder (HCC) 06/14/2016  . Type 2  diabetes mellitus with diabetic neuropathy (HCC) 05/26/2016  . Iron deficiency anemia 05/26/2016  . Chronic kidney disease 05/26/2016  . HTN (hypertension) 05/26/2016  . Tobacco abuse 05/26/2016  . History of substance abuse 05/26/2016  . History of alcohol abuse 05/26/2016  . Asthma 05/26/2016   Past Medical History:  Diagnosis Date  . Diabetes mellitus without complication (HCC)     No family history on file.  No past surgical history on file. Social History   Occupational History  . Not on file.   Social History Main Topics  . Smoking status: Current Every Day Smoker    Packs/day: 1.00    Years: 45.00    Types:  Cigarettes    Start date: 05/16/1971  . Smokeless tobacco: Never Used     Comment: Now smokes about 3 cigarettes a week  . Alcohol use No  . Drug use: No  . Sexual activity: Not on file

## 2016-09-27 ENCOUNTER — Ambulatory Visit: Payer: Medicaid Other | Admitting: Family Medicine

## 2016-09-28 ENCOUNTER — Ambulatory Visit: Payer: Medicaid Other | Admitting: Family Medicine

## 2016-09-29 ENCOUNTER — Encounter: Payer: Medicaid Other | Admitting: Allergy and Immunology

## 2016-09-29 ENCOUNTER — Encounter: Payer: Self-pay | Admitting: Allergy and Immunology

## 2016-09-29 ENCOUNTER — Ambulatory Visit: Payer: Medicaid Other | Admitting: Family Medicine

## 2016-09-29 NOTE — Progress Notes (Signed)
New Patient Note  RE: [name] MRN: [id number] DOB: 12-Aug-1955 Date of Office Visit: 09/29/2016  Referring provider: [name], DO Primary care provider: [name], DO  Chief Complaint: No chief complaint on file.   History of present illness: Dylan Jordan is a 61 y.o. male seen today in consultation requested by Lucila Maine, DO.     Assessment and plan: No problem-specific Assessment & Plan notes found for this encounter.   No orders of the defined types were placed in this encounter.      Physical examination: There were no vitals taken for this visit.  General: Alert, interactive, in no acute distress. Abdomen: Nondistended, nontender. ept as noted in HPI / PMHx or noted below: Review of Systems  Constitutional: Negative.   HENT: Negative.   Eyes: Negative.   Respiratory: Negative.   Cardiovascular: Negative.   Gastrointestinal: Negative.   Genitourinary: Negative.   Musculoskeletal: Negative.   Skin: Negative.   Neurological: Negative.   Endo/Heme/Allergies: Negative.   Psychiatric/Behavioral: Negative.     Past medical history:  Past Medical History:  Diagnosis Date  . Diabetes mellitus without complication [facility])     Past surgical history:  No past surgical history on file.  Family history:  No family history on file.  Social history: Social History   Social History  . Marital status: Single    Spouse name: N/A  . Number of children: N/A  . Years of education: N/A   Occupational History  . Not on file.   Social History Main Topics  . Smoking status: Current Every Day Smoker    Packs/day: 1.00    Years: 45.00    Types: Cigarettes    Start date: 05/16/1971  . Smokeless tobacco: Never Used     Comment: Now smokes about 3 cigarettes a week  . Alcohol use No  . Drug use: No  . Sexual activity: Not on file   Other Topics Concern  . Not on file   Social History Narrative  . No narrative on file    Environmental History:   Allergies as of 09/29/2016      Reactions   Penicillins Diarrhea      Medication List       Accurate as of 09/29/16  8:39 AM. Always use your most recent med list.          ACCU-CHEK AVIVA PLUS w/Device Kit Use to test blood sugar twice a day. ICD-10 code: E11.9   accu-chek soft touch lancets Use as instructed   ACCU-CHEK FASTCLIX LANCETS Misc Use to test blood sugar twice a day. ICD-10 code:E11.9   ACCU-CHEK SOFTCLIX LANCET DEV Kit Use to test blood sugar twice a day. ICD-10 code: E11.9   amLODipine 5 MG tablet Commonly known as:  NORVASC Take 1 tablet (5 mg total) by mouth daily.   ammonium lactate 12 % cream Commonly known as:  AMLACTIN Apply topically as needed for dry skin.   aspirin EC 81 MG tablet Take 1 tablet (81 mg total) by mouth daily.   atorvastatin 20 MG tablet Commonly known as:  LIPITOR Take 1 tablet (20 mg total) by mouth daily.   benztropine 2 MG tablet Commonly known as:  COGENTIN Take 2 mg by mouth 2 (two) times daily.   cetirizine 10 MG tablet Commonly known as:  ZYRTEC Take 1 tablet (10 mg total) by mouth daily.   DAILY MULTIPLE VITAMINS tablet Take 1 tablet by mouth daily.   diclofenac  75 MG EC tablet Commonly known as:  VOLTAREN Take 1 tablet (75 mg total) by mouth 2 (two) times daily.   divalproex 500 MG DR tablet Commonly known as:  DEPAKOTE Take 500-2,000 mg by mouth 2 (two) times daily. Take one tab qAM and four tabs qPM   ferrous sulfate 325 (65 FE) MG tablet Commonly known as:  FERROUSUL Take 1 tablet (325 mg total) by mouth daily with breakfast.   fluticasone 50 MCG/ACT nasal spray Commonly known as:  FLONASE Place 2 sprays into both nostrils daily.   Fluticasone-Salmeterol 250-50 MCG/DOSE Aepb Commonly known as:  ADVAIR DISKUS Inhale 1 puff into the lungs 2 (two) times daily.   glimepiride 1 MG tablet Commonly known as:  AMARYL Take 1 tablet (1 mg total) by mouth daily with  breakfast.   glucose blood test strip Commonly known as:  ACCU-CHEK AVIVA PLUS Use to test blood sugar twice a day. ICD-10 code: E11.9   haloperidol 10 MG tablet Commonly known as:  HALDOL Take 10 mg by mouth every evening.   haloperidol decanoate 100 MG/ML injection Commonly known as:  HALDOL DECANOATE Inject 400 mg into the muscle every 28 (twenty-eight) days.   lithium carbonate 300 MG capsule Take 300 mg by mouth 2 (two) times daily with a meal.   losartan 25 MG tablet Commonly known as:  COZAAR TAKE 1 TABLET (25 MG TOTAL) BY MOUTH DAILY.   metFORMIN 500 MG tablet Commonly known as:  GLUCOPHAGE Take 2 tablets (1,000 mg total) by mouth 2 (two) times daily with a meal.   montelukast 10 MG tablet Commonly known as:  SINGULAIR TAKE 1 TABLET (10 MG TOTAL) BY MOUTH AT BEDTIME.   nicotine 21 mg/24hr patch Commonly known as:  NICODERM CQ - dosed in mg/24 hours Place 1 patch (21 mg total) onto the skin daily.   omeprazole 20 MG capsule Commonly known as:  PRILOSEC TAKE 2 CAPSULES (40 MG TOTAL) BY MOUTH DAILY.       Known medication allergies: Allergies  Allergen Reactions  . Penicillins Diarrhea    I appreciate the opportunity to take part in Barnabas's care. Please do not hesitate to contact me with questions.  Sincerely,   R. Edgar Frisk, MD  This encounter was created in error - please disregard.

## 2016-09-29 NOTE — Telephone Encounter (Signed)
Spoke with patient and he already made an appointment on Monday to be seen for his ears.  Also he was currently at his allergy and asthma appointment and I advised him to have them check his ears too.  Kerston Landeck,CMA

## 2016-09-29 NOTE — Telephone Encounter (Signed)
Please let [name] know he can buy an over the counter ear drops to soften ear wax, no prescription needed. Thanks!

## 2016-09-30 ENCOUNTER — Other Ambulatory Visit: Payer: Self-pay | Admitting: Family Medicine

## 2016-10-02 ENCOUNTER — Ambulatory Visit: Payer: Medicaid Other | Admitting: Obstetrics and Gynecology

## 2016-10-02 NOTE — Progress Notes (Deleted)
   Subjective:   Patient ID: Parthenia Ameshilip Kerkman, male    DOB: [birth year], 61 y.o.   MRN: [id number]  Patient presents for Same Day Appointment  No chief complaint on file.   HPI: # Hearing difficulties   Review of Systems   See HPI for ROS.   History  Smoking Status  . Current Every Day Smoker  . Packs/day: 1.00  . Years: 45.00  . Types: Cigarettes  . Start date: 05/16/1971  Smokeless Tobacco  . Never Used    Comment: Now smokes about 3 cigarettes a week    Past medical history, surgical, family, and social history reviewed and updated in the EMR as appropriate.  Pertinent Historical Findings include: Objective:  There were no vitals taken for this visit. Vitals and nursing note reviewed  Physical Exam  Assessment & Plan:  There are no diagnoses linked to this encounter.    Diagnosis and plan along with any newly prescribed medication(s) were discussed in detail with this patient today. The patient verbalized understanding and agreed with the plan. Patient advised if symptoms worsen return to clinic or ER.   PATIENT EDUCATION PROVIDED: See AVS   Caryl AdaJazma Eliyanna Ault, DO 10/02/2016, 7:34 AM PGY-3, [facility]

## 2016-10-03 ENCOUNTER — Encounter: Payer: Self-pay | Admitting: Physical Therapy

## 2016-10-03 ENCOUNTER — Ambulatory Visit: Payer: Medicaid Other | Attending: Orthopaedic Surgery | Admitting: Physical Therapy

## 2016-10-03 DIAGNOSIS — M25561 Pain in right knee: Secondary | ICD-10-CM | POA: Diagnosis present

## 2016-10-03 DIAGNOSIS — M25562 Pain in left knee: Secondary | ICD-10-CM | POA: Insufficient documentation

## 2016-10-03 DIAGNOSIS — G8929 Other chronic pain: Secondary | ICD-10-CM

## 2016-10-03 DIAGNOSIS — M545 Low back pain: Secondary | ICD-10-CM | POA: Insufficient documentation

## 2016-10-03 NOTE — Telephone Encounter (Signed)
I will approve this refill, but it looks like Dr. Wonda Oldsiccio wanted to see him back 3 months after January. Could we call and get him a followup appt with her to recheck BP? Thanks!

## 2016-10-03 NOTE — Therapy (Signed)
[facility] [address] [place], [place], [address] Phone: [phone]   Fax:  [phone]  Physical Therapy Evaluation  Patient Details  Name: Dylan Jordan MRN: [id number] Date of Birth: 1956/01/02 Referring Provider: Tarry Kos, MD  Encounter Date: 10/03/2016      PT End of Session - 10/03/16 1245    Visit Number 1   Authorization Type medicaid- one time evaluation   PT Start Time 1308   PT Stop Time 1351   PT Time Calculation (min) 43 min   Activity Tolerance Patient tolerated treatment well   Behavior During Therapy [facility] for tasks assessed/performed      Past Medical History:  Diagnosis Date  . Asthma   . Diabetes mellitus without complication [facility])     Past Surgical History:  Procedure Laterality Date  . REPLACEMENT TOTAL KNEE Left     There were no vitals filed for this visit.       Subjective Assessment - 10/03/16 1315    Subjective L lateral knee dull, sharp pain; throbbing when it is going to rain. Numbing pain in lower back. Uses heat and takes hot shower which helps. Denies any regular stretching or exercise.    Currently in Pain? Yes   Pain Score 2    Pain Location --  back and knees            [facility] PT Assessment - 10/03/16 0001      Assessment   Medical Diagnosis LBP, bilateral knee pain   Referring Provider Tarry Kos, MD     Precautions   Precautions None     Restrictions   Weight Bearing Restrictions No     Balance Screen   Has the patient fallen in the past 6 months No     Prior Function   Level of Independence Independent     Cognition   Overall Cognitive Status Within Functional Limits for tasks assessed     Sensation   Additional Comments Occasional N/T in feet.                    [facility] Adult PT Treatment/Exercise - 10/03/16 0001      Exercises   Exercises Other Exercises   Other Exercises  see scanned pt instructions                 PT Education - 10/03/16 1354    Education provided Yes   Education Details anatomy of condition, POC, HEP, exercise form/rationale, importance of exercise and diet   Person(s) Educated Patient   Methods Explanation;Demonstration;Tactile cues;Verbal cues;Handout   Comprehension Verbalized understanding;Returned demonstration;Verbal cues required;Tactile cues required;Need further instruction                    Plan - 10/03/16 1355    Clinical Impression Statement Pt presents to PT with complaints of bilteral knee and lower back pain. Pt denies doing any regular strengthening or stretching. Pt was educated on importance of regular exercise and was able to perform exercises today with difficulty. Pt may return after getting Medicare. I instructed him to contact us with any questions and encouraged him to be aware of diet and exercise for general health.    PT Frequency --  one time mediaid evaluation   PT Treatment/Interventions ADLs/Self Care Home Management;Therapeutic exercise;Patient/family education   Consulted and Agree with Plan of Care Patient      Patient will benefit from skilled therapeutic intervention in order  to improve the following deficits and impairments:  Difficulty walking, Pain, Impaired flexibility, Decreased strength  Visit Diagnosis: Chronic bilateral low back pain, with sciatica presence unspecified - Plan: PT plan of care cert/re-cert  Chronic pain of right knee - Plan: PT plan of care cert/re-cert  Chronic pain of left knee - Plan: PT plan of care cert/re-cert     Problem List Patient Active Problem List   Diagnosis Date Noted  . Chronic cough 06/30/2016  . Drug-induced mood disorder (HCC) 06/14/2016  . Type 2 diabetes mellitus with diabetic neuropathy (HCC) 05/26/2016  . Iron deficiency anemia 05/26/2016  . Chronic kidney disease 05/26/2016  . HTN (hypertension) 05/26/2016  . Tobacco abuse 05/26/2016  . History of substance abuse  05/26/2016  . History of alcohol abuse 05/26/2016  . Asthma 05/26/2016   Currie Dennin C. Galen Malkowski PT, DPT 10/03/16 2:13 PM   [facility] [address] [place], [place], [address] Phone: [phone]   Fax:  [phone]  Name: Dylan Ameshilip Jordan MRN: [id number] Date of Birth: 01/04/1956

## 2016-10-03 NOTE — Telephone Encounter (Signed)
Patient was recently seen on 09-14-16 with Dr. Wonda Oldsiccio. Jazmin Hartsell,CMA

## 2016-10-04 ENCOUNTER — Telehealth (INDEPENDENT_AMBULATORY_CARE_PROVIDER_SITE_OTHER): Payer: Self-pay | Admitting: Orthopaedic Surgery

## 2016-10-04 NOTE — Telephone Encounter (Signed)
Patient called advised medicaid need approval for his pain medicine. The number to contact patient is [phone]  The number to the pharmacy is  [phone] Per patient please leave a message when calling back patient if he can not answer phone.

## 2016-10-04 NOTE — Telephone Encounter (Signed)
Did PA through [employer] online. STATUS: Pending Confirmation #: [id number] W PATIENT AWARE

## 2016-10-05 ENCOUNTER — Telehealth (INDEPENDENT_AMBULATORY_CARE_PROVIDER_SITE_OTHER): Payer: Self-pay | Admitting: Orthopaedic Surgery

## 2016-10-05 ENCOUNTER — Other Ambulatory Visit: Payer: Self-pay | Admitting: Family Medicine

## 2016-10-05 NOTE — Telephone Encounter (Signed)
Please advise. Patient has Medicaid. Thanks.

## 2016-10-05 NOTE — Telephone Encounter (Signed)
Patient called concerning his medication. I advised patient medication was denied. Patient asked if he can get something else prescribed for him. The number to contact patient is [phone]

## 2016-10-05 NOTE — Telephone Encounter (Signed)
Can try tramadol #30

## 2016-10-06 ENCOUNTER — Other Ambulatory Visit (INDEPENDENT_AMBULATORY_CARE_PROVIDER_SITE_OTHER): Payer: Self-pay

## 2016-10-06 MED ORDER — TRAMADOL HCL 50 MG PO TABS: 50.0000 mg | ORAL_TABLET | Freq: Two times a day (BID) | ORAL | 0 refills | Status: DC

## 2016-10-06 NOTE — Telephone Encounter (Signed)
Rx called into pharm, pt aware

## 2016-10-06 NOTE — Telephone Encounter (Signed)
Pt called back and he has arranged to pick up his prescription before he leaves town.

## 2016-10-06 NOTE — Telephone Encounter (Signed)
Blue team, could we call either the patient or the pharmacy and clarify why we are getting repeated requests for the same meds? I refilled vitamins and losartan a few days ago. I don't want to duplicate and confuse the patient.

## 2016-10-06 NOTE — Telephone Encounter (Signed)
[facility] [address]. Pt is leaving on the train today at 1:30 to go visit his dying father in [place]. He is taking a cab to the train station that his niece arranged. He doesn't know the name of the cab company so he cannot call them to ask the cab  to take him to the pharmacy.  [facility] says [facility] not [facility] to a [facility] in [place]. He is asking Dr Wonda Oldsiccio to [facility] [address] in [place] [phone] to have them fill the prescription.  He wants to be called by a nurse or Dr Cherie Darkicco to let him know what is going on.

## 2016-10-06 NOTE — Telephone Encounter (Signed)
Thanks for the clarification blue team! So no need to change the refills? Epic won't let me re order them to an alternate [facility] for some reason. I'm happy to call the other [facility] if necessary.

## 2016-10-10 ENCOUNTER — Ambulatory Visit: Payer: Medicaid Other | Admitting: Allergy and Immunology

## 2016-10-11 ENCOUNTER — Ambulatory Visit: Payer: Medicaid Other | Admitting: Family Medicine

## 2016-10-12 ENCOUNTER — Ambulatory Visit: Payer: Medicaid Other | Admitting: Student

## 2016-10-17 ENCOUNTER — Ambulatory Visit: Payer: Medicaid Other | Admitting: Podiatry

## 2016-11-06 ENCOUNTER — Other Ambulatory Visit: Payer: Self-pay | Admitting: Family Medicine

## 2016-11-06 ENCOUNTER — Other Ambulatory Visit (INDEPENDENT_AMBULATORY_CARE_PROVIDER_SITE_OTHER): Payer: Self-pay | Admitting: Orthopaedic Surgery

## 2016-11-06 MED ORDER — ATORVASTATIN CALCIUM 20 MG PO TABS: 20.0000 mg | ORAL_TABLET | Freq: Every day | ORAL | 3 refills | Status: DC

## 2016-11-06 MED ORDER — AMLODIPINE BESYLATE 5 MG PO TABS: 5.0000 mg | ORAL_TABLET | Freq: Every day | ORAL | 3 refills | Status: DC

## 2016-11-06 NOTE — Telephone Encounter (Signed)
Vitamin D are listed on current medication list.  Clovis PuMartin, Tamika L, RN

## 2016-11-06 NOTE — Telephone Encounter (Signed)
Patient calling again concerning Rx.

## 2016-11-06 NOTE — Telephone Encounter (Signed)
2nd request. ep °

## 2016-11-06 NOTE — Telephone Encounter (Signed)
Pt is calling and would like a refill in his medications. Amlodipine, Vitamin D-3 soft gel, Atorvastatin. Please call patient so that he knows when to go pick these up. jw

## 2016-11-06 NOTE — Telephone Encounter (Signed)
Spoke with patient and he is aware of this.  States that he wasn't given a full 90 days for his losartan.  Advised patient to call his pharmacy and ask them about it because we can't send him in another script. Jazmin Hartsell,CMA

## 2016-11-06 NOTE — Telephone Encounter (Signed)
Refilled atorvastatin and norvasc. No vitamin D on list. He can buy this over the counter, no prescription needed. Please call to let him know. Thanks

## 2016-11-06 NOTE — Telephone Encounter (Signed)
Patient called asked if he can get something stronger for pain. Patient said he is in a lot of pain. Patient said the Tramadol is not strong enough. [facility] [address]. Patient said he is going out of town 11/09/16 and will return 11/27/16.  The number to contact patient is [phone]

## 2016-11-06 NOTE — Telephone Encounter (Signed)
Norco #20.  1 tab po bid prn

## 2016-11-06 NOTE — Telephone Encounter (Signed)
Please advise 

## 2016-11-07 ENCOUNTER — Other Ambulatory Visit (INDEPENDENT_AMBULATORY_CARE_PROVIDER_SITE_OTHER): Payer: Self-pay | Admitting: Radiology

## 2016-11-07 MED ORDER — HYDROCODONE-ACETAMINOPHEN 5-325 MG PO TABS: 1.0000 | ORAL_TABLET | Freq: Two times a day (BID) | ORAL | 0 refills | Status: DC

## 2016-11-07 NOTE — Telephone Encounter (Signed)
I called advised pt that rx is ready for pick up at the front desk

## 2016-11-07 NOTE — Telephone Encounter (Signed)
Hey I think Dr. Roda ShuttersXu meant to send this to [facility]ot[facility] Shames[facility]. On yesterday.  Can you check with him please?  Thank You.

## 2016-11-08 ENCOUNTER — Encounter: Payer: Self-pay | Admitting: Podiatry

## 2016-11-08 ENCOUNTER — Ambulatory Visit (INDEPENDENT_AMBULATORY_CARE_PROVIDER_SITE_OTHER): Payer: Medicaid Other | Admitting: Podiatry

## 2016-11-08 DIAGNOSIS — M79676 Pain in unspecified toe(s): Secondary | ICD-10-CM

## 2016-11-08 DIAGNOSIS — M79609 Pain in unspecified limb: Principal | ICD-10-CM

## 2016-11-08 DIAGNOSIS — B351 Tinea unguium: Secondary | ICD-10-CM | POA: Diagnosis not present

## 2016-11-08 NOTE — Progress Notes (Signed)
Complaint:  Visit Type: Patient returns to my office for continued preventative foot care services. Complaint: Patient states" my nails have grown long and thick and become painful to walk and wear shoes" The patient presents for preventative foot care services. No changes to ROS  Podiatric Exam: Vascular: dorsalis pedis and posterior tibial pulses are palpable bilateral. Capillary return is immediate. Temperature gradient is WNL. Skin turgor WNL  Sensorium: Normal Semmes Weinstein monofilament test. Normal tactile sensation bilaterally. Nail Exam: Pt has thick disfigured discolored nails with subungual debris noted bilateral entire nail hallux through fifth toenails Ulcer Exam: There is no evidence of ulcer or pre-ulcerative changes or infection. Orthopedic Exam: Muscle tone and strength are WNL. No limitations in general ROM. No crepitus or effusions noted. HAV  B/L. Skin: No Porokeratosis. No infection or ulcers.  Pinch callus hallux  B/L.  Diagnosis:  Onychomycosis, , Pain in right toe, pain in left toes  Treatment & Plan Procedures and Treatment: Consent by patient was obtained for treatment procedures. The patient understood the discussion of treatment and procedures well. All questions were answered thoroughly reviewed. Debridement of mycotic and hypertrophic toenails, 1 through 5 bilateral and clearing of subungual debris. No ulceration, no infection noted.  Return Visit-Office Procedure: Patient instructed to return to the office for a follow up visit when scheduled  for continued evaluation and treatment.    Helane GuntherGregory Rexford Prevo DPM

## 2016-11-22 ENCOUNTER — Other Ambulatory Visit: Payer: Self-pay | Admitting: Family Medicine

## 2016-11-30 ENCOUNTER — Ambulatory Visit (INDEPENDENT_AMBULATORY_CARE_PROVIDER_SITE_OTHER): Payer: Medicaid Other | Admitting: Internal Medicine

## 2016-11-30 ENCOUNTER — Encounter: Payer: Self-pay | Admitting: Internal Medicine

## 2016-11-30 VITALS — BP 118/76 | HR 70 | Temp 98.3°F | Wt 235.0 lb

## 2016-11-30 DIAGNOSIS — E1140 Type 2 diabetes mellitus with diabetic neuropathy, unspecified: Secondary | ICD-10-CM | POA: Diagnosis present

## 2016-11-30 LAB — POCT GLYCOSYLATED HEMOGLOBIN (HGB A1C): Hemoglobin A1C: 8

## 2016-11-30 MED ORDER — GLIMEPIRIDE 2 MG PO TABS: 2.0000 mg | ORAL_TABLET | Freq: Every day | ORAL | 0 refills | Status: DC

## 2016-11-30 MED ORDER — GLIMEPIRIDE 2 MG PO TABS: 1.0000 mg | ORAL_TABLET | Freq: Every day | ORAL | 0 refills | Status: DC

## 2016-11-30 MED ORDER — GLIMEPIRIDE 4 MG PO TABS: 4.0000 mg | ORAL_TABLET | Freq: Two times a day (BID) | ORAL | 0 refills | Status: DC

## 2016-11-30 MED ORDER — NICOTINE 21 MG/24HR TD PT24
21.0000 mg | MEDICATED_PATCH | Freq: Every day | TRANSDERMAL | 3 refills | Status: DC
Start: 2016-11-30 — End: 2017-02-16

## 2016-11-30 MED ORDER — GLIMEPIRIDE 1 MG PO TABS: 1.0000 mg | ORAL_TABLET | Freq: Every day | ORAL | 0 refills | Status: DC

## 2016-11-30 NOTE — Progress Notes (Signed)
   [facility] Phone: [phone]  Subjective:  Dylan Chancehilip is a 61 year old male presenting to clinic for follow-up of diabetes. He is prescribed Amaryl 1mg  daily, but has been taking 4mg  bid. He is also taking Metformin 1000mg  bid. No nausea, diarrhea, GI upset. Checking CBGs twice a day. Have been ranging from 108-219, but mostly running 130-180. No lows. No shakiness, no sweatiness. States he thinks his sugars have been running high because of stress. He has been stress eating a lot of sweets recently.   ROS: See HPI for pertinent positives and negatives  Past Medical History- HTN, T2DM, asthma, CKD, iron deficiency anemia  Family history reviewed for today's visit. No changes.  Social history- patient is a current smoker  Objective: BP 118/76   Pulse 70   Temp 98.3 F (36.8 C) (Oral)   Wt 235 lb (106.6 kg)   SpO2 97%   BMI 30.17 kg/m  Gen: NAD, alert, cooperative with exam HEENT: NCAT, EOMI, MMM CV: RRR, no murmur Resp: CTABL, no wheezes, normal work of breathing Diabetic foot: No deformities, no ulcerations, no other skin breakdown bilaterally; sensation intact to touch, but decreased to monofilament testing throughout entire foot bilaterally; PT and DP pulses intact bilaterally.  Assessment/Plan: T2DM: Uncontrolled. Last A1c 6.3% on 09/14/16. Repeat A1c today was 8.0%. Supposed to be taking Amaryl 1mg  daily per PCP's most recent note, but has been taking 4mg  bid. Also taking Metformin 1000mg  bid. At max doses for both meds. - Refill provided for Amaryl 4mg  bid - Continue Metformin 1000mg  bid - Diabetic foot exam performed today - Had diabetic eye exam in 05/2016. Will ask Tia to assist with obtaining records. - Patient counseled on reducing intake of sweets - Follow-up with PCP in 3 months for repeat A1c. Did not make any medication changes because meds were just changed by PCP two months ago.   Willadean CarolKaty Lemma Tetro, MD PGY-3

## 2016-11-30 NOTE — Patient Instructions (Addendum)
It was so nice to meet you!  Your hemoglobin A1c was 8.0%. Please try to decrease the amount of sweets that you are eating.   I have refilled your Amaryl.  Please follow-up with Dr. Wonda Oldsiccio in 3 months for recheck of your A1c.  -Dr. Nancy MarusMayo

## 2016-11-30 NOTE — Progress Notes (Deleted)
   [facility] Phone: [phone]  Subjective:  Dylan Jordan is a 61 year old male presenting to clinic for follow-up of his diabetes. Last A1c was 6.3% on 09/14/16.   ROS: See HPI for pertinent positives and negatives  Past Medical History  Family history reviewed for today's visit. No changes.  Social history- patient is a *** smoker  Objective: BP 118/76   Pulse 70   Temp 98.3 F (36.8 C) (Oral)   Wt 235 lb (106.6 kg)   SpO2 97%   BMI 30.17 kg/m  Gen: NAD, alert, cooperative with exam HEENT: NCAT, EOMI, MMM Neck: FROM, supple CV: RRR, no murmur Resp: CTABL, no wheezes, normal work of breathing GI: SNTND, BS present, no guarding or organomegaly Msk: No edema, warm, normal tone, moves UE/LE spontaneously Neuro: Alert and oriented, no gross deficits Skin: No rashes, no lesions Psych: Appropriate behavior  Assessment/Plan: See problem based a/p   Willadean CarolKaty Mayo, MD PGY-3

## 2016-12-01 ENCOUNTER — Ambulatory Visit (INDEPENDENT_AMBULATORY_CARE_PROVIDER_SITE_OTHER): Payer: Medicaid Other | Admitting: Podiatry

## 2016-12-01 ENCOUNTER — Encounter: Payer: Self-pay | Admitting: Podiatry

## 2016-12-01 ENCOUNTER — Telehealth: Payer: Self-pay | Admitting: Student

## 2016-12-01 DIAGNOSIS — L84 Corns and callosities: Secondary | ICD-10-CM | POA: Diagnosis not present

## 2016-12-01 DIAGNOSIS — E1140 Type 2 diabetes mellitus with diabetic neuropathy, unspecified: Secondary | ICD-10-CM

## 2016-12-01 MED ORDER — GLIMEPIRIDE 4 MG PO TABS: 4.0000 mg | ORAL_TABLET | Freq: Two times a day (BID) | ORAL | 0 refills | Status: DC

## 2016-12-01 NOTE — Progress Notes (Signed)
Complaint:  Visit Type: Patient returns to my office for continued preventative foot care services. Complaint: Patient states his fourth toe right foot is painful at the site of his nail right foot.  He says he dug the nail out and now has painful callus on nail bed site.  Podiatric Exam: Vascular: dorsalis pedis and posterior tibial pulses are palpable bilateral. Capillary return is immediate. Temperature gradient is WNL. Skin turgor WNL  Sensorium: Normal Semmes Weinstein monofilament test. Normal tactile sensation bilaterally. Nail Exam: Pt has thick disfigured discolored nails with subungual debris noted bilateral entire nail hallux through fifth toenails Ulcer Exam: There is no evidence of ulcer or pre-ulcerative changes or infection. Orthopedic Exam: Muscle tone and strength are WNL. No limitations in general ROM. No crepitus or effusions noted. HAV  B/L. Skin: No Porokeratosis. No infection or ulcers.  Pinch callus hallux  B/L.  Diagnosis:  Onychomycosis, , Pain in right toe, pain in left toes  Treatment & Plan Procedures and Treatment: Consent by patient was obtained for treatment procedures. The patient understood the discussion of treatment and procedures well. All questions were answered thoroughly reviewed Debride corn fourth toe right foot.  Padding dispensed.    Return Visit-Office Procedure: Patient instructed to return to the office for a follow up visit for preventative foot care services.    Helane GuntherGregory Jakwon Gayton DPM

## 2016-12-01 NOTE — Assessment & Plan Note (Signed)
Uncontrolled. Last A1c 6.3% on 09/14/16. Repeat A1c today was 8.0%. Supposed to be taking Amaryl 1mg  daily per PCP's most recent note, but has been taking 4mg  bid. Also taking Metformin 1000mg  bid. At max doses for both meds. - Refill provided for Amaryl 4mg  bid - Continue Metformin 1000mg  bid - Diabetic foot exam performed today - Had diabetic eye exam in 05/2016. Will ask Tia to assist with obtaining records. - Patient counseled on reducing intake of sweets - Follow-up with PCP in 3 months for repeat A1c. Did not make any medication changes because meds were just changed by PCP two months ago.

## 2016-12-02 NOTE — Telephone Encounter (Signed)
Patient calls on after hour pager and states that his glimepiride prescription is not right. He says he is supposed to be on 4 mg twice a day but pharmacy is telling him different. Looking in epic glimepiride 4 mg twice a day #180 was sent to his pharmacy by a provider who saw him yesterday. I explained this to him and reordered the glimepiride to his pharmacy again. Advised patient to call pharmacy in the morning and let us know.  Patient voices understanding and agrees.

## 2016-12-04 ENCOUNTER — Other Ambulatory Visit: Payer: Self-pay | Admitting: Family Medicine

## 2016-12-05 ENCOUNTER — Telehealth: Payer: Self-pay

## 2016-12-05 ENCOUNTER — Other Ambulatory Visit: Payer: Self-pay | Admitting: Family Medicine

## 2016-12-05 MED ORDER — CETIRIZINE HCL 10 MG PO TABS: 10.0000 mg | ORAL_TABLET | Freq: Every day | ORAL | 3 refills | Status: DC

## 2016-12-05 MED ORDER — FLUTICASONE PROPIONATE 50 MCG/ACT NA SUSP
2.0000 | Freq: Every day | NASAL | 6 refills | Status: DC
Start: 2016-12-05 — End: 2017-11-01

## 2016-12-05 NOTE — Telephone Encounter (Signed)
Would like allergy medication refilled. He is not sure what the name of the medication is. [facility]. Sunday SpillersSharon T Saunders, CMA

## 2016-12-05 NOTE — Telephone Encounter (Signed)
Please inform patient he should have 9 refills on his Zyrtec and 6 refills on his Flonase. He can call the pharmacy to get these refilled. Thanks

## 2016-12-05 NOTE — Progress Notes (Signed)
  Allergy medications refilled to preferred patient pharmacy.  Dolores PattyAngela Nikoletta Varma, DO PGY-2, [facility] 12/05/2016 4:09 PM

## 2016-12-05 NOTE — Telephone Encounter (Signed)
Contacted pt and informed him of this, however, pt does not want to use the pharmacy where refills are at. Please resend both zyrtec and Flonase to [facility]. Thank you!

## 2016-12-12 ENCOUNTER — Ambulatory Visit: Payer: Medicaid Other | Admitting: Allergy and Immunology

## 2016-12-13 ENCOUNTER — Ambulatory Visit: Payer: Medicaid Other | Admitting: Podiatry

## 2016-12-21 ENCOUNTER — Telehealth: Payer: Self-pay | Admitting: Family Medicine

## 2016-12-21 NOTE — Telephone Encounter (Signed)
Medication list printed and placed up front. Kaisey Huseby,CMA

## 2016-12-21 NOTE — Telephone Encounter (Signed)
Pt is transferring his medications to [facility]. He would like for us to leave an current and updated medication list up front so that he can make sure that [facility] transferred all his prescription and also to make sure that he doesn't need refills or if the pharmacy needs new prescription jw

## 2016-12-25 ENCOUNTER — Telehealth: Payer: Self-pay | Admitting: *Deleted

## 2016-12-25 NOTE — Telephone Encounter (Signed)
Patient called in states he's using a new pharmacy [facility]) and needs meds refilled but doesn't know which ones. Asked him to call pharmacy to see which ones are needed but as they are a new pharmacy for him  they don't know either. Patient will look through all his meds and call us back L. Leward Quanucatte, RN, BSN

## 2016-12-27 ENCOUNTER — Telehealth: Payer: Self-pay | Admitting: *Deleted

## 2016-12-27 NOTE — Telephone Encounter (Signed)
Patient left voice message on nurse line stating he is in severe lower back pain.  He was diagnosed at chiropractor with arthritis of lower back. The tramadol is not working. Please give him a call at [phone].  Clovis PuMartin, Valia Wingard L, RN

## 2016-12-28 ENCOUNTER — Other Ambulatory Visit: Payer: Self-pay | Admitting: Family Medicine

## 2016-12-28 NOTE — Telephone Encounter (Signed)
It looks like patient has been seeing Dr. Roda ShuttersXu for his lower back pain and was prescribed diclofenac (not approved by insurance) then tramadol then norco. He can follow up with Dr. Roda ShuttersXu for pain medication if the tramadol is not working, or he can increase his tramadol dose to 100 mg as long as he has enough pills.

## 2017-01-01 ENCOUNTER — Ambulatory Visit (INDEPENDENT_AMBULATORY_CARE_PROVIDER_SITE_OTHER): Payer: Medicaid Other | Admitting: Internal Medicine

## 2017-01-01 ENCOUNTER — Encounter: Payer: Self-pay | Admitting: Internal Medicine

## 2017-01-01 DIAGNOSIS — M545 Low back pain: Secondary | ICD-10-CM

## 2017-01-01 DIAGNOSIS — G8929 Other chronic pain: Secondary | ICD-10-CM

## 2017-01-01 MED ORDER — GABAPENTIN 300 MG PO CAPS: 300.0000 mg | ORAL_CAPSULE | Freq: Three times a day (TID) | ORAL | 0 refills | Status: DC | PRN

## 2017-01-01 MED ORDER — DICLOFENAC SODIUM 1 % TD GEL: 4.0000 g | Freq: Four times a day (QID) | TRANSDERMAL | 2 refills | Status: DC

## 2017-01-01 NOTE — Patient Instructions (Signed)
It was so nice to see you!  For your back pain, please use Tylenol 650mg  every 6 hours as needed. We want you to use this medication the majority of the time. I have prescribed some Voltaren gel. You can use this 4 times a day as needed. I have also prescribed some Gabapentin. Please use this at bedtime to start with, then you can use it 3 times a day as needed.  Please follow-up in our clinic in 3-4 weeks.  -Dr. Nancy Marus

## 2017-01-03 ENCOUNTER — Encounter: Payer: Self-pay | Admitting: Podiatry

## 2017-01-03 ENCOUNTER — Ambulatory Visit (INDEPENDENT_AMBULATORY_CARE_PROVIDER_SITE_OTHER): Payer: Medicaid Other | Admitting: Podiatry

## 2017-01-03 ENCOUNTER — Telehealth: Payer: Self-pay | Admitting: *Deleted

## 2017-01-03 DIAGNOSIS — B351 Tinea unguium: Secondary | ICD-10-CM

## 2017-01-03 DIAGNOSIS — M545 Low back pain, unspecified: Secondary | ICD-10-CM | POA: Insufficient documentation

## 2017-01-03 DIAGNOSIS — G8929 Other chronic pain: Secondary | ICD-10-CM | POA: Insufficient documentation

## 2017-01-03 DIAGNOSIS — M79676 Pain in unspecified toe(s): Secondary | ICD-10-CM | POA: Diagnosis not present

## 2017-01-03 DIAGNOSIS — L84 Corns and callosities: Secondary | ICD-10-CM

## 2017-01-03 DIAGNOSIS — E1142 Type 2 diabetes mellitus with diabetic polyneuropathy: Secondary | ICD-10-CM

## 2017-01-03 DIAGNOSIS — M79609 Pain in unspecified limb: Principal | ICD-10-CM

## 2017-01-03 NOTE — Progress Notes (Signed)
Complaint:  Visit Type: Patient returns to my office for continued preventative foot care services. Complaint: Patient states" my nails have grown long and thick and become painful to walk and wear shoes" The patient presents for preventative foot care services. No changes to ROS  Podiatric Exam: Vascular: dorsalis pedis and posterior tibial pulses are palpable bilateral. Capillary return is immediate. Temperature gradient is WNL. Skin turgor WNL  Sensorium: Normal Semmes Weinstein monofilament test. Normal tactile sensation bilaterally. Nail Exam: Pt has thick disfigured discolored nails with subungual debris noted bilateral entire nail hallux through fifth toenails Ulcer Exam: There is no evidence of ulcer or pre-ulcerative changes or infection. Orthopedic Exam: Muscle tone and strength are WNL. No limitations in general ROM. No crepitus or effusions noted. HAV  B/L. Skin: No Porokeratosis. No infection or ulcers. Asymptomatic   Pinch callus hallux  B/L. Heloma durum 5th left.  Diagnosis:  Onychomycosis, , Pain in right toe, pain in left toes,  Debride corn 5th left.  Treatment & Plan Procedures and Treatment: Consent by patient was obtained for treatment procedures. The patient understood the discussion of treatment and procedures well. All questions were answered thoroughly reviewed. Debridement of mycotic and hypertrophic toenails, 1 through 5 bilateral and clearing of subungual debris. No ulceration, no infection noted. Debride corn.  RTC 5 weeks. Return Visit-Office Procedure: Patient instructed to return to the office for a follow up visit when scheduled  for continued evaluation and treatment.    Helane Gunther DPM

## 2017-01-03 NOTE — Telephone Encounter (Signed)
Returned patient's call. I let him know that the change in breathing is likely not related to the Gabapentin or voltaren gel that I prescribed on 8/20. If he continues to have breathing problems or if his breathing worsens, he should call our clinic to schedule an appointment. Patient voiced understanding.

## 2017-01-03 NOTE — Telephone Encounter (Signed)
Patient stating he was given a number of medications and wanted to discuss medication with provider. He also stated his breathing pattern is off and think it might be coming from one of the medications. Please give him a call [phone].  Clovis Pu, RN

## 2017-01-03 NOTE — Assessment & Plan Note (Addendum)
Likely due to arthritis. Seen by ortho and had a lumbar x-ray done that showed degenerative disc disease at L5-S1 and lumbar spondylosis. Straight leg raise negative and patient has full ROM of his back without pain, making disc herniation less likely. No red flags to suggest malignancy or infection. - Continue Tylenol tid - Prescribed Voltaren gel qid - Will try Gabapentin 300mg  tid, given that he sometimes has radicular symptoms - Patient interested in trying TENS unit and will obtain this on his own - Given home rehabilitation exercises - Follow-up with PCP in 3-4 weeks to monitor for improvement

## 2017-01-03 NOTE — Progress Notes (Signed)
   [name] Phone: [phone]  Subjective:  Dylan Jordan is a 61 year old male presenting to clinic with lower back pain for a year. The pain is located across his whole lower back. The pain feels like a "hurting". The pain is worse with laying down, moving around in bed, getting out of bed in the morning, walking, bending over, and sitting. The pain is better with laying completely still in bed. He states he saw an orthopedic doctor for severe pain in his back. The orthopedic doctor recommended Tramadol, which didn't help at all. Patient has been using Tylenol tid at home, which helps to bring his pain from a 10/10 to a 6-7/10. He endorses occasional pain that shoots down the back of his legs. No fevers, no chills, no weight loss, no pain that wakes him up from sleep, no saddle anesthesia, no bladder/bowel incontinence.  ROS: See HPI for pertinent positives and negatives  Past Medical History- HTN, asthma, T2DM, CKD  Family history reviewed for today's visit. No changes.  Social history- patient is a current smoker, hx of alcohol abuse.  Objective: BP 136/78   Pulse 82   Temp 98.2 F (36.8 C) (Oral)   Wt 246 lb (111.6 kg)   SpO2 98%   BMI 31.58 kg/m  Gen: NAD, alert, cooperative with exam Back: +tenderness to palpation along the paraspinal muscles of the lumbar spine. No midline tenderness, no step offs. Full ROM. Hips: Normal ROM, no gross deformities. Neuro: 5/5 muscle strength in the lower extremities bilaterally, sensation intact to light touch throughout, reflexes normal and symmetric, gait normal, straight leg raise negative bilaterally  Assessment/Plan: Low Back Pain: Likely due to arthritis. Seen by ortho and had a lumbar x-ray done that showed degenerative disc disease at L5-S1 and lumbar spondylosis. Straight leg raise negative and patient has full ROM of his back without pain, making disc herniation less likely. No red flags to suggest malignancy or  infection. - Continue Tylenol tid - Prescribed Voltaren gel qid - Will try Gabapentin 300mg  tid, given that he sometimes has radicular symptoms - Patient interested in trying TENS unit and will obtain this on his own - Given home rehabilitation exercises - Follow-up with PCP in 3-4 weeks to monitor for improvement.  Willadean Carol, MD PGY-3

## 2017-01-03 NOTE — Telephone Encounter (Signed)
Please have patient come in to be seen by a provider if he is having breathing issues. Thank you.

## 2017-01-05 ENCOUNTER — Telehealth: Payer: Self-pay | Admitting: *Deleted

## 2017-01-05 NOTE — Telephone Encounter (Signed)
Patient called stating he think he is having a medication reaction to gabapentin.  Pateint is having chest pain, chills, cold and SOB. Advised patient he needed to be seen for further evaluation.  Patient stated he does not have transportation to be seen in clinic. Advised patient to call 911 to transport to ED.  Clovis Pu, RN

## 2017-01-09 ENCOUNTER — Ambulatory Visit: Payer: Medicaid Other | Admitting: Allergy and Immunology

## 2017-01-09 ENCOUNTER — Emergency Department (HOSPITAL_COMMUNITY): Payer: Medicaid Other

## 2017-01-09 ENCOUNTER — Encounter (HOSPITAL_COMMUNITY): Payer: Self-pay | Admitting: *Deleted

## 2017-01-09 ENCOUNTER — Emergency Department (HOSPITAL_COMMUNITY)
Admission: EM | Admit: 2017-01-09 | Discharge: 2017-01-09 | Disposition: A | Discharge status: Home / Self Care | Payer: Medicaid Other | Attending: Emergency Medicine | Admitting: Emergency Medicine

## 2017-01-09 DIAGNOSIS — R110 Nausea: Secondary | ICD-10-CM | POA: Insufficient documentation

## 2017-01-09 DIAGNOSIS — I129 Hypertensive chronic kidney disease with stage 1 through stage 4 chronic kidney disease, or unspecified chronic kidney disease: Secondary | ICD-10-CM | POA: Insufficient documentation

## 2017-01-09 DIAGNOSIS — J181 Lobar pneumonia, unspecified organism: Secondary | ICD-10-CM | POA: Diagnosis not present

## 2017-01-09 DIAGNOSIS — R509 Fever, unspecified: Secondary | ICD-10-CM | POA: Diagnosis present

## 2017-01-09 DIAGNOSIS — Z79899 Other long term (current) drug therapy: Secondary | ICD-10-CM | POA: Insufficient documentation

## 2017-01-09 DIAGNOSIS — N189 Chronic kidney disease, unspecified: Secondary | ICD-10-CM | POA: Insufficient documentation

## 2017-01-09 DIAGNOSIS — Z96652 Presence of left artificial knee joint: Secondary | ICD-10-CM | POA: Diagnosis not present

## 2017-01-09 DIAGNOSIS — J45909 Unspecified asthma, uncomplicated: Secondary | ICD-10-CM | POA: Diagnosis not present

## 2017-01-09 DIAGNOSIS — E1122 Type 2 diabetes mellitus with diabetic chronic kidney disease: Secondary | ICD-10-CM | POA: Insufficient documentation

## 2017-01-09 DIAGNOSIS — Z7984 Long term (current) use of oral hypoglycemic drugs: Secondary | ICD-10-CM | POA: Diagnosis not present

## 2017-01-09 DIAGNOSIS — F17210 Nicotine dependence, cigarettes, uncomplicated: Secondary | ICD-10-CM | POA: Insufficient documentation

## 2017-01-09 DIAGNOSIS — J189 Pneumonia, unspecified organism: Secondary | ICD-10-CM

## 2017-01-09 DIAGNOSIS — Z7982 Long term (current) use of aspirin: Secondary | ICD-10-CM | POA: Insufficient documentation

## 2017-01-09 LAB — COMPREHENSIVE METABOLIC PANEL
ALBUMIN: 3.1 g/dL — AB (ref 3.5–5.0)
ALK PHOS: 54 U/L (ref 38–126)
ALT: 19 U/L (ref 17–63)
ANION GAP: 11 (ref 5–15)
AST: 14 U/L — ABNORMAL LOW (ref 15–41)
BUN: 13 mg/dL (ref 6–20)
CHLORIDE: 102 mmol/L (ref 101–111)
CO2: 23 mmol/L (ref 22–32)
Calcium: 9.5 mg/dL (ref 8.9–10.3)
Creatinine, Ser: 1.34 mg/dL — ABNORMAL HIGH (ref 0.61–1.24)
GFR calc Af Amer: >60 mL/min (ref >60)
GFR calc non Af Amer: 56 mL/min — ABNORMAL LOW (ref >60)
GLUCOSE: 183 mg/dL — AB (ref 65–99)
POTASSIUM: 4 mmol/L (ref 3.5–5.1)
SODIUM: 136 mmol/L (ref 135–145)
Total Bilirubin: 0.4 mg/dL (ref 0.3–1.2)
Total Protein: 7.6 g/dL (ref 6.5–8.1)

## 2017-01-09 LAB — LIPASE, BLOOD: LIPASE: 26 U/L (ref 11–51)

## 2017-01-09 LAB — CBC
HEMATOCRIT: 36.7 % — AB (ref 39.0–52.0)
HEMOGLOBIN: 11.7 g/dL — AB (ref 13.0–17.0)
MCH: 28.1 pg (ref 26.0–34.0)
MCHC: 31.9 g/dL (ref 30.0–36.0)
MCV: 88.2 fL (ref 78.0–100.0)
Platelets: 250 10*3/uL (ref 150–400)
RBC: 4.16 MIL/uL — ABNORMAL LOW (ref 4.22–5.81)
RDW: 13.8 % (ref 11.5–15.5)
WBC: 10.3 10*3/uL (ref 4.0–10.5)

## 2017-01-09 MED ORDER — DEXTROSE 5 % IV SOLN
500.0000 mg | Freq: Once | INTRAVENOUS | Status: AC
  Administered 2017-01-09: 500 mg via INTRAVENOUS
  Filled 2017-01-09: qty 500

## 2017-01-09 MED ORDER — CEFUROXIME AXETIL 250 MG PO TABS: 250.0000 mg | ORAL_TABLET | Freq: Two times a day (BID) | ORAL | 0 refills | Status: AC

## 2017-01-09 MED ORDER — AZITHROMYCIN 250 MG PO TABS: 250.0000 mg | ORAL_TABLET | Freq: Every day | ORAL | 0 refills | Status: AC

## 2017-01-09 MED ORDER — SODIUM CHLORIDE 0.9 % IV BOLUS (SEPSIS)
1000.0000 mL | Freq: Once | INTRAVENOUS | Status: AC
  Administered 2017-01-09: 1000 mL via INTRAVENOUS

## 2017-01-09 MED ORDER — ACETAMINOPHEN 500 MG PO TABS
1000.0000 mg | ORAL_TABLET | Freq: Once | ORAL | Status: AC
  Administered 2017-01-09: 1000 mg via ORAL
  Filled 2017-01-09: qty 2

## 2017-01-09 MED ORDER — DEXTROSE 5 % IV SOLN
1.0000 g | Freq: Once | INTRAVENOUS | Status: AC
  Administered 2017-01-09: 1 g via INTRAVENOUS
  Filled 2017-01-09: qty 10

## 2017-01-09 NOTE — ED Triage Notes (Signed)
C/o fever and chills onset 1 week ago states he was around a family member that had a virus and thinks he got it.

## 2017-01-09 NOTE — ED Notes (Signed)
Social worker at bedside.

## 2017-01-09 NOTE — ED Notes (Signed)
Attempted to collect UA, pt unable to provide at this time. Gave pt coke, ok per Charity fundraiser.

## 2017-01-09 NOTE — ED Provider Notes (Signed)
[facility] DEPT Provider Note   CSN: [id number] Arrival date & time: 01/09/17  5361     History   Chief Complaint Chief Complaint  Patient presents with  . Fever  . Nausea    HPI Dylan Jordan is a 61 y.o. male.  Patient c/o fever onset 1 week ago. Fever intermittent. States family member had a virus.  Denies runny nose or sore throat. +non prod cough. Episodic. No chest pain. Denies abd pain. No vomiting or diarrhea. No dysuria or gu c/o. No rash.     The history is provided by the patient.  Fever   Associated symptoms include cough. Pertinent negatives include no chest pain, no diarrhea, no vomiting, no headaches and no sore throat.    Past Medical History:  Diagnosis Date  . Asthma   . Diabetes mellitus without complication [facility])     Patient Active Problem List   Diagnosis Date Noted  . Chronic low back pain 01/03/2017  . Chronic cough 06/30/2016  . Drug-induced mood disorder ([place]) 06/14/2016  . Type 2 diabetes mellitus with diabetic neuropathy ([place]) 05/26/2016  . Iron deficiency anemia 05/26/2016  . Chronic kidney disease 05/26/2016  . HTN (hypertension) 05/26/2016  . Tobacco abuse 05/26/2016  . History of substance abuse 05/26/2016  . History of alcohol abuse 05/26/2016  . Asthma 05/26/2016    Past Surgical History:  Procedure Laterality Date  . REPLACEMENT TOTAL KNEE Left   . TOE SURGERY         Home Medications    Prior to Admission medications   Medication Sig Start Date End Date Taking? Authorizing Provider  ACCU-CHEK FASTCLIX LANCETS MISC Use to test blood sugar twice a day. ICD-10 code:E11.9 07/05/16   [name], DO  amLODipine (NORVASC) 5 MG tablet Take 1 tablet (5 mg total) by mouth daily. 11/06/16   [name], DO  ammonium lactate (AMLACTIN) 12 % cream Apply topically as needed for dry skin. 07/26/16   [name], DPM  aspirin EC 81 MG tablet Take 1 tablet (81 mg total) by mouth daily. 08/04/16   [name], DO    atorvastatin (LIPITOR) 20 MG tablet Take 1 tablet (20 mg total) by mouth daily. 11/06/16   [name], DO  benztropine (COGENTIN) 2 MG tablet Take 2 mg by mouth 2 (two) times daily.    [provider]  Blood Glucose Monitoring Suppl (ACCU-CHEK AVIVA PLUS) w/Device KIT Use to test blood sugar twice a day. ICD-10 code: E11.9 05/30/16   [name], DO  cetirizine (ZYRTEC) 10 MG tablet Take 1 tablet (10 mg total) by mouth daily. 12/05/16   [name], DO  diclofenac (VOLTAREN) 75 MG EC tablet Take 1 tablet (75 mg total) by mouth 2 (two) times daily. 09/26/16   [name], MD  diclofenac sodium (VOLTAREN) 1 % GEL Apply 4 g topically 4 (four) times daily. 01/01/17   Mayo, [name], MD  divalproex (DEPAKOTE) 500 MG DR tablet Take 500-2,000 mg by mouth 2 (two) times daily. Take one tab qAM and four tabs qPM    [provider]  ferrous sulfate (FERROUSUL) 325 (65 FE) MG tablet Take 1 tablet (325 mg total) by mouth daily with breakfast. 08/04/16   [name], [name], DO  fluticasone (FLONASE) 50 MCG/ACT nasal spray Place 2 sprays into both nostrils daily. 12/05/16   [name], DO  Fluticasone-Salmeterol (ADVAIR DISKUS) 250-50 MCG/DOSE AEPB Inhale 1 puff into the lungs 2 (  two) times daily. 07/19/16   [name], DO  gabapentin (NEURONTIN) 300 MG capsule Take 1 capsule (300 mg total) by mouth 3 (three) times daily as needed. 01/01/17   Mayo, [name], MD  glimepiride (AMARYL) 4 MG tablet Take 1 tablet (4 mg total) by mouth 2 (two) times daily. 12/01/16   Mercy Riding, MD  glucose blood (ACCU-CHEK AVIVA PLUS) test strip Use to test blood sugar twice a day. ICD-10 code: E11.9 05/30/16   [name], DO  haloperidol (HALDOL) 10 MG tablet Take 10 mg by mouth every evening.     [provider]  haloperidol decanoate (HALDOL DECANOATE) 100 MG/ML injection Inject 400 mg into the muscle every 28 (twenty-eight) days.    [provider]   HYDROcodone-acetaminophen (NORCO/VICODIN) 5-325 MG tablet Take 1 tablet by mouth 2 (two) times daily. 11/07/16   [name], MD  Lancets (ACCU-CHEK SOFT TOUCH) lancets Use as instructed 06/17/16   [name], DO  Lancets Misc. (ACCU-CHEK SOFTCLIX LANCET DEV) KIT Use to test blood sugar twice a day. ICD-10 code: E11.9 07/05/16   [name], DO  lithium carbonate 300 MG capsule Take 300 mg by mouth 2 (two) times daily with a meal.    [provider]  losartan (COZAAR) 25 MG tablet TAKE 1 TABLET BY MOUTH EVERY DAY 10/03/16   Sela Hilding, MD  metFORMIN (GLUCOPHAGE) 500 MG tablet TAKE 2 TABLETS BY MOUTH TWICE A DAY WITH MEALS 12/04/16   [name], [name], DO  montelukast (SINGULAIR) 10 MG tablet TAKE 1 TABLET (10 MG TOTAL) BY MOUTH AT BEDTIME. 09/08/16   [name], DO  Multiple Vitamin (DAILY-VITE) TABS TAKE 1 TABLET BY MOUTH DAILY. 10/03/16   Sela Hilding, MD  nicotine (NICODERM CQ - DOSED IN MG/24 HOURS) 21 mg/24hr patch Place 1 patch (21 mg total) onto the skin daily. 11/30/16   Mayo, [name], MD  omeprazole (PRILOSEC) 20 MG capsule TAKE 2 CAPSULES (40 MG TOTAL) BY MOUTH DAILY. 09/12/16   [name], DO  traMADol (ULTRAM) 50 MG tablet Take 1 tablet (50 mg total) by mouth 2 (two) times daily. 10/06/16   [name], MD    Family History Family History  Problem Relation Age of Onset  . Allergic rhinitis Neg Hx   . Angioedema Neg Hx   . Asthma Neg Hx   . Atopy Neg Hx   . Eczema Neg Hx   . Immunodeficiency Neg Hx   . Urticaria Neg Hx     Social History Social History  Substance Use Topics  . Smoking status: Current Every Day Smoker    Packs/day: 1.00    Years: 45.00    Types: Cigarettes    Start date: 05/16/1971  . Smokeless tobacco: Never Used     Comment: Now smokes about 3 cigarettes a week  . Alcohol use No     Allergies   Ativan [lorazepam] and Penicillins   Review of Systems Review of Systems  Constitutional: Positive for fever.   HENT: Negative for sore throat.   Eyes: Negative for redness.  Respiratory: Positive for cough. Negative for shortness of breath.   Cardiovascular: Negative for chest pain.  Gastrointestinal: Negative for abdominal pain, diarrhea and vomiting.  Genitourinary: Negative for dysuria and flank pain.  Musculoskeletal: Negative for back pain, neck pain and neck stiffness.  Skin: Negative for rash.  Neurological: Negative for headaches.  Hematological: Does not bruise/bleed easily.  Psychiatric/Behavioral: Negative for  confusion.     Physical Exam Updated Vital Signs BP (!) 148/83   Pulse 92   Temp 99.5 F (37.5 C) (Oral)   Resp 16   SpO2 96%   Physical Exam  Constitutional: He appears well-developed and well-nourished. No distress.  HENT:  Mouth/Throat: Oropharynx is clear and moist.  Eyes: Pupils are equal, round, and reactive to light. Conjunctivae are normal.  Neck: Neck supple. No tracheal deviation present. No thyromegaly present.  No stiffness or rigidity  Cardiovascular: Normal rate, regular rhythm, normal heart sounds and intact distal pulses.  Exam reveals no gallop and no friction rub.   No murmur heard. Pulmonary/Chest: Effort normal. No accessory muscle usage. No respiratory distress. He has rales.  Right rales/rhonchi  Abdominal: Soft. Bowel sounds are normal. He exhibits no distension. There is no tenderness.  No hsm.   Genitourinary:  Genitourinary Comments: No cva tenderness  Musculoskeletal: He exhibits no edema or tenderness.  Neurological: He is alert.  Skin: Skin is warm and dry. No rash noted.  Psychiatric: He has a normal mood and affect.  Nursing note and vitals reviewed.    ED Treatments / Results  Labs (all labs ordered are listed, but only abnormal results are displayed) Results for orders placed or performed during the hospital encounter of 01/09/17  Lipase, blood  Result Value Ref Range   Lipase 26 11 - 51 U/L  Comprehensive metabolic panel   Result Value Ref Range   Sodium 136 135 - 145 mmol/L   Potassium 4.0 3.5 - 5.1 mmol/L   Chloride 102 101 - 111 mmol/L   CO2 23 22 - 32 mmol/L   Glucose, Bld 183 (H) 65 - 99 mg/dL   BUN 13 6 - 20 mg/dL   Creatinine, Ser 1.34 (H) 0.61 - 1.24 mg/dL   Calcium 9.5 8.9 - 10.3 mg/dL   Total Protein 7.6 6.5 - 8.1 g/dL   Albumin 3.1 (L) 3.5 - 5.0 g/dL   AST 14 (L) 15 - 41 U/L   ALT 19 17 - 63 U/L   Alkaline Phosphatase 54 38 - 126 U/L   Total Bilirubin 0.4 0.3 - 1.2 mg/dL   GFR calc non Af Amer 56 (L) >60 mL/min   GFR calc Af Amer >60 >60 mL/min   Anion gap 11 5 - 15  CBC  Result Value Ref Range   WBC 10.3 4.0 - 10.5 K/uL   RBC 4.16 (L) 4.22 - 5.81 MIL/uL   Hemoglobin 11.7 (L) 13.0 - 17.0 g/dL   HCT 36.7 (L) 39.0 - 52.0 %   MCV 88.2 78.0 - 100.0 fL   MCH 28.1 26.0 - 34.0 pg   MCHC 31.9 30.0 - 36.0 g/dL   RDW 13.8 11.5 - 15.5 %   Platelets 250 150 - 400 K/uL    EKG  EKG Interpretation None       Radiology Dg Chest 2 View  Result Date: 01/09/2017 CLINICAL DATA:  One week of cough, chest congestion, right-sided chest pain, and chills. Current smoker. History of diabetes and hypertension. EXAM: CHEST  2 VIEW COMPARISON:  None in PACs FINDINGS: The lungs are adequately inflated. There is increased density in the right perihilar and right lower lobe regions. There is no pleural effusion. The left lung is clear. The heart and pulmonary vascularity are normal. There is calcification in the wall of the aortic arch. The bony thorax is unremarkable. IMPRESSION: Right perihilar and lower lobe infiltrate or atelectasis. Followup PA and lateral chest  X-ray is recommended in 3-4 weeks following trial of antibiotic therapy to ensure resolution and exclude underlying malignancy. Thoracic aortic atherosclerosis. Electronically Signed   By: David  Martinique M.D.   On: 01/09/2017 09:28    Procedures Procedures (including critical care time)  Medications Ordered in ED Medications  acetaminophen  (TYLENOL) tablet 1,000 mg (1,000 mg Oral Given 01/09/17 0912)     Initial Impression / Assessment and Plan / ED Course  I have reviewed the triage vital signs and the nursing notes.  Pertinent labs & imaging results that were available during my care of the patient were reviewed by me and considered in my medical decision making (see chart for details).  Acetaminophen po.  Po fluids.  Labs/xrays.  Reviewed nursing notes and prior charts for additional history.   Chest exam and xr c/w right sided pneumonia.   Rocephin and zithromax iv.  Ivf.   Po fluids.  Recheck, no increased wob.  Pt comfortable, tolerating po.    Final Clinical Impressions(s) / ED Diagnoses   Final diagnoses:  None    New Prescriptions New Prescriptions   No medications on file     Lajean Saver, MD 01/09/17 1154

## 2017-01-09 NOTE — ED Notes (Signed)
Social worker advised this RN that patient now stating he has money for a cab to get home at discharge.

## 2017-01-09 NOTE — ED Notes (Signed)
Pt has soda at bedside, refused water.

## 2017-01-09 NOTE — ED Notes (Signed)
Pt requesting to speak with social worker regarding transportation home at discharge

## 2017-01-09 NOTE — ED Notes (Signed)
ED Provider at bedside. 

## 2017-01-09 NOTE — ED Notes (Addendum)
Pt transported to XR and returned. Pt on monitors resting comfortably

## 2017-01-09 NOTE — ED Notes (Signed)
Spoke with Child psychotherapist and made her aware that patient was requesting to speak with her about transportation stating that he did not have any way home since he came via ambulance.

## 2017-01-09 NOTE — Discharge Instructions (Signed)
It was our pleasure to provide your ER care today - we hope that you feel better.  Take antibiotics as prescribed.  Rest. Drink adequate fluids.  Follow up with primary care doctor in the next few days.  Also have your doctor repeat your chest xray in 1 month to make sure it clears/returns to normal.   Return to ER if worse, increased trouble breathing, other concern.

## 2017-01-10 ENCOUNTER — Other Ambulatory Visit: Payer: Self-pay | Admitting: Internal Medicine

## 2017-01-10 ENCOUNTER — Other Ambulatory Visit: Payer: Self-pay | Admitting: *Deleted

## 2017-01-10 MED ORDER — CETIRIZINE HCL 10 MG PO TABS
10.0000 mg | ORAL_TABLET | Freq: Every day | ORAL | 3 refills | Status: DC
Start: 2017-01-10 — End: 2017-11-01

## 2017-01-10 MED ORDER — FERROUS SULFATE 325 (65 FE) MG PO TABS: 325.0000 mg | ORAL_TABLET | Freq: Every day | ORAL | 3 refills | Status: DC

## 2017-01-10 MED ORDER — OMEPRAZOLE 20 MG PO CPDR: 40.0000 mg | DELAYED_RELEASE_CAPSULE | Freq: Every day | ORAL | 2 refills | Status: DC

## 2017-01-16 ENCOUNTER — Ambulatory Visit: Payer: Medicaid Other | Admitting: Family Medicine

## 2017-01-18 ENCOUNTER — Ambulatory Visit (INDEPENDENT_AMBULATORY_CARE_PROVIDER_SITE_OTHER): Payer: Medicaid Other | Admitting: Internal Medicine

## 2017-01-18 VITALS — BP 130/76 | HR 81 | Temp 98.1°F | Ht 74.0 in | Wt 241.6 lb

## 2017-01-18 DIAGNOSIS — J181 Lobar pneumonia, unspecified organism: Secondary | ICD-10-CM

## 2017-01-18 DIAGNOSIS — H6123 Impacted cerumen, bilateral: Secondary | ICD-10-CM | POA: Diagnosis not present

## 2017-01-18 DIAGNOSIS — J189 Pneumonia, unspecified organism: Secondary | ICD-10-CM

## 2017-01-18 DIAGNOSIS — Z23 Encounter for immunization: Secondary | ICD-10-CM | POA: Diagnosis not present

## 2017-01-18 DIAGNOSIS — Z720 Tobacco use: Secondary | ICD-10-CM | POA: Diagnosis not present

## 2017-01-18 NOTE — Patient Instructions (Signed)
It was so nice to see you!  Please go to [facility] any day during the week of 9/18-9/21 to have a chest x-ray repeated. Their address is [address], [place], [place].  Please follow-up with your primary care doctor- Dr. Wonda Oldsiccio- as needed in the future.  -Dr. Nancy MarusMayo

## 2017-01-19 DIAGNOSIS — H6123 Impacted cerumen, bilateral: Secondary | ICD-10-CM | POA: Insufficient documentation

## 2017-01-19 DIAGNOSIS — J189 Pneumonia, unspecified organism: Secondary | ICD-10-CM | POA: Insufficient documentation

## 2017-01-19 DIAGNOSIS — J181 Lobar pneumonia, unspecified organism: Principal | ICD-10-CM

## 2017-01-19 NOTE — Progress Notes (Signed)
   [facility] Phone: [phone]  Subjective:  Dylan Jordan is a 61 year old male presenting to clinic for follow-up of pneumonia. He was seen in the ED 8/28 and had a CXR done that showed a density in the right perihilar and right lower lobe regions. He was given some IV fluids and IV antibiotics in the ED. Per patient, he was sent home with two different antibiotics, which he took for 7 days. He states he feels back to normal. He denies any fevers, chills, cough, or shortness of breath. He states he has also been trying to cut back on smoking. He has only smoked 1-3 cigarettes in the last week. He states that he thinks he can quit. He feels most tempted to smoke when he is around other people that smoke. He states that cigarettes help him to relax.  Patient would also like for his ears to be cleaned out. They feel "clogged". No ear pain. No hearing loss. No tinnitus. No ear drainage.  ROS: See HPI for pertinent positives and negatives  Past Medical History- HTN, asthma, T2DM, CKD, chronic low back pain  Family history reviewed for today's visit. No changes.  Social history- patient is a current smoker. Hx of alcohol and substance abuse.  Objective: BP 130/76   Pulse 81   Temp 98.1 F (36.7 C)   Ht 6\' 2"  (1.88 m)   Wt 241 lb 9.6 oz (109.6 kg)   BMI 31.02 kg/m  Gen: NAD, alert, cooperative with exam HEENT: NCAT, EOMI, MMM, unable to visualized TMs due to cerumen CV: RRR, no murmur Resp: Mild rhonchi and crackles in the right lung base, lungs otherwise clear with good air movement, normal work of breathing Msk: No lower extremity edema Neuro: Alert and oriented, no gross deficites Skin: No rashes/lesions on exposed skin  Assessment/Plan: CAP: Much improved after course of antibiotics. Still with some residual rhonchi and crackles in the right lung base.  - Radiologist recommending repeat CXR in 3-4 weeks to ensure that pneumonia has cleared. Repeat CXR  ordered. - Follow-up as needed  Tobacco Use: Cutting back. Now smoking 3 cigarettes per week. - Praised patient for cutting back on cigarettes - Recommended that patient stop completely at this point - Discussed ways to avoid triggers, such as not spending time with people who smoke. - Follow-up with PCP  Cerumen Impaction: - Cerumen disimpaction performed in clinic today   [name], MD PGY-3

## 2017-01-19 NOTE — Assessment & Plan Note (Signed)
-   Cerumen disimpaction performed in clinic today

## 2017-01-19 NOTE — Assessment & Plan Note (Signed)
Cutting back. Now smoking 3 cigarettes per week. - Praised patient for cutting back on cigarettes - Recommended that patient stop completely at this point - Discussed ways to avoid triggers, such as not spending time with people who smoke. - Follow-up with PCP

## 2017-01-19 NOTE — Assessment & Plan Note (Signed)
Much improved after course of antibiotics. Still with some residual rhonchi and crackles in the right lung base.  - Radiologist recommending repeat CXR in 3-4 weeks to ensure that pneumonia has cleared. Repeat CXR ordered. - Follow-up as needed

## 2017-01-22 ENCOUNTER — Other Ambulatory Visit: Payer: Self-pay | Admitting: Family Medicine

## 2017-01-25 ENCOUNTER — Ambulatory Visit: Payer: Medicaid Other | Admitting: Family Medicine

## 2017-02-07 ENCOUNTER — Ambulatory Visit (INDEPENDENT_AMBULATORY_CARE_PROVIDER_SITE_OTHER): Payer: Medicaid Other | Admitting: Podiatry

## 2017-02-07 ENCOUNTER — Encounter: Payer: Self-pay | Admitting: Podiatry

## 2017-02-07 DIAGNOSIS — M79676 Pain in unspecified toe(s): Secondary | ICD-10-CM | POA: Diagnosis not present

## 2017-02-07 DIAGNOSIS — E1142 Type 2 diabetes mellitus with diabetic polyneuropathy: Secondary | ICD-10-CM

## 2017-02-07 DIAGNOSIS — B351 Tinea unguium: Secondary | ICD-10-CM

## 2017-02-07 DIAGNOSIS — M79609 Pain in unspecified limb: Principal | ICD-10-CM

## 2017-02-07 DIAGNOSIS — L84 Corns and callosities: Secondary | ICD-10-CM

## 2017-02-07 NOTE — Progress Notes (Signed)
Complaint:  Visit Type: Patient returns to my office for continued preventative foot care services. Complaint: Patient states" my nails have grown long and thick and become painful to walk and wear shoes" The patient presents for preventative foot care services. No changes to ROS  Podiatric Exam: Vascular: dorsalis pedis and posterior tibial pulses are palpable bilateral. Capillary return is immediate. Temperature gradient is WNL. Skin turgor WNL  Sensorium: Normal Semmes Weinstein monofilament test. Normal tactile sensation bilaterally. Nail Exam: Pt has thick disfigured discolored nails with subungual debris noted bilateral entire nail hallux through fifth toenails Ulcer Exam: There is no evidence of ulcer or pre-ulcerative changes or infection. Orthopedic Exam: Muscle tone and strength are WNL. No limitations in general ROM. No crepitus or effusions noted. HAV  B/L. Skin: No Porokeratosis. No infection or ulcers. Asymptomatic   Pinch callus hallux  B/L  Diagnosis:  Onychomycosis, , Pain in right toe, pain in left toes,  Debride pinch callus.  Treatment & Plan Procedures and Treatment: Consent by patient was obtained for treatment procedures. The patient understood the discussion of treatment and procedures well. All questions were answered thoroughly reviewed. Debridement of mycotic and hypertrophic toenails, 1 through 5 bilateral and clearing of subungual debris. No ulceration, no infection noted. Debride callus   RTC 5 weeks. Return Visit-Office Procedure: Patient instructed to return to the office for a follow up visit when scheduled  for continued evaluation and treatment.    Helane Gunther DPM

## 2017-02-08 ENCOUNTER — Ambulatory Visit: Payer: Medicaid Other | Admitting: Family Medicine

## 2017-02-09 ENCOUNTER — Telehealth: Payer: Self-pay

## 2017-02-09 ENCOUNTER — Telehealth: Payer: Self-pay | Admitting: *Deleted

## 2017-02-09 ENCOUNTER — Ambulatory Visit
Admission: RE | Admit: 2017-02-09 | Discharge: 2017-02-09 | Disposition: A | Discharge status: Home / Self Care | Payer: Medicaid Other | Source: Ambulatory Visit | Attending: Family Medicine | Admitting: Family Medicine

## 2017-02-09 DIAGNOSIS — J189 Pneumonia, unspecified organism: Secondary | ICD-10-CM

## 2017-02-09 DIAGNOSIS — J181 Lobar pneumonia, unspecified organism: Principal | ICD-10-CM

## 2017-02-09 NOTE — Telephone Encounter (Signed)
Patient left message on nurse line requesting CXR results taken today. Attempted to reach patient to relay normal CXR results. Left message on VM requesting return call. Kinnie Feil, RN, BSN

## 2017-02-09 NOTE — Telephone Encounter (Signed)
Attempted to contact pt to inform of clear scan, pt did not aswer, VM was left for him to return my call. Please inform him his pneumonia has cleared!

## 2017-02-09 NOTE — Telephone Encounter (Signed)
-----   Message from Campbell Stall, MD sent at 02/09/2017  3:53 PM EDT ----- Please let Mr. Weinreb know that his chest x-ray showed that his pneumonia has cleared. Thanks!

## 2017-02-12 ENCOUNTER — Encounter: Payer: Self-pay | Admitting: Family Medicine

## 2017-02-12 ENCOUNTER — Ambulatory Visit: Payer: Medicaid Other | Admitting: Family Medicine

## 2017-02-16 ENCOUNTER — Encounter: Payer: Self-pay | Admitting: Family Medicine

## 2017-02-16 ENCOUNTER — Ambulatory Visit (INDEPENDENT_AMBULATORY_CARE_PROVIDER_SITE_OTHER): Payer: Medicaid Other | Admitting: Family Medicine

## 2017-02-16 ENCOUNTER — Other Ambulatory Visit: Payer: Self-pay | Admitting: Internal Medicine

## 2017-02-16 VITALS — BP 138/80 | HR 91 | Temp 98.6°F | Wt 236.0 lb

## 2017-02-16 DIAGNOSIS — R197 Diarrhea, unspecified: Secondary | ICD-10-CM | POA: Diagnosis not present

## 2017-02-16 DIAGNOSIS — H6123 Impacted cerumen, bilateral: Secondary | ICD-10-CM

## 2017-02-16 DIAGNOSIS — Z720 Tobacco use: Secondary | ICD-10-CM | POA: Diagnosis not present

## 2017-02-16 DIAGNOSIS — Z23 Encounter for immunization: Secondary | ICD-10-CM

## 2017-02-16 DIAGNOSIS — E1140 Type 2 diabetes mellitus with diabetic neuropathy, unspecified: Secondary | ICD-10-CM | POA: Diagnosis present

## 2017-02-16 DIAGNOSIS — G8929 Other chronic pain: Secondary | ICD-10-CM | POA: Diagnosis not present

## 2017-02-16 DIAGNOSIS — M545 Low back pain: Secondary | ICD-10-CM | POA: Diagnosis not present

## 2017-02-16 LAB — POCT GLYCOSYLATED HEMOGLOBIN (HGB A1C): HEMOGLOBIN A1C: 9.7

## 2017-02-16 NOTE — Patient Instructions (Signed)
   It was great seeing you today!  Let's work on improving your diet to get sugars under better control.  If you have questions or concerns please do not hesitate to call at [phone].  [name], DO PGY-2, [facility] 02/16/2017 3:12 PM    Diet Recommendations for Diabetes   Starchy (carb) foods: Bread, rice, pasta, potatoes, corn, cereal, grits, crackers, bagels, muffins, all baked goods.  (Fruits, milk, and yogurt also have carbohydrate, but most of these foods will not spike your blood sugar as the starchy foods will.)  A few fruits do cause high blood sugars; use small portions of bananas (limit to 1/2 at a time), grapes, watermelon, oranges, and most tropical fruits.    Protein foods: Meat, fish, poultry, eggs, dairy foods, and beans such as pinto and kidney beans (beans also provide carbohydrate).   1. Eat at least 3 meals and 1-2 snacks per day. Never go more than 4-5 hours while awake without eating. Eat breakfast within the first hour of getting up.   2. Limit starchy foods to TWO per meal and ONE per snack. ONE portion of a starchy  food is equal to the following:   - ONE slice of bread (or its equivalent, such as half of a hamburger bun).   - 1/2 cup of a "scoopable" starchy food such as potatoes or rice.   - 15 grams of carbohydrate as shown on food label.  3. Include at every meal: a protein food, a carb food, and vegetables and/or fruit.   - Obtain twice the volume of veg's as protein or carbohydrate foods for both lunch and dinner.   - Fresh or frozen veg's are best.   - Keep frozen veg's on hand for a quick vegetable serving.

## 2017-02-16 NOTE — Assessment & Plan Note (Deleted)
Ears washed out today

## 2017-02-16 NOTE — Assessment & Plan Note (Addendum)
  Patient with known DJD of lumbar spine, has seen ortho in [place]. Has seen PT. No red flag symptoms.   -follow up w/ ortho as needed  -encouraged tylenol for low back pain -encouraged PT exercises

## 2017-02-16 NOTE — Assessment & Plan Note (Signed)
  Continues to smoke, using nicotine patch to try to quit  -encouraged cessation

## 2017-02-16 NOTE — Assessment & Plan Note (Addendum)
  Watery bowel movements occurred after recent abx course, concerning for c. Dif. Documented 9 pound weight loss in past month. Doubt this is related to metformin as he has tolerated in the past.   -will check stool for c.dif -follow up w/ patient regarding results

## 2017-02-16 NOTE — Assessment & Plan Note (Addendum)
  Chronic, A1C today 9.7. Per patient he has slacked off on his diet.  -encouraged patient to follow DM diet- handout given -discussed food options at length with him -will likely refer him to nutrition in the future but hold for now -continue current medications regimen -follow up 3 months

## 2017-02-16 NOTE — Progress Notes (Signed)
    Subjective:    Patient ID: Dylan Jordan, male    DOB: Sep 25, 1955, 61 y.o.   MRN: [id number]   CC: diarrhea  Diarrhea  Has been having diarrhea since recent antibiotic course for CAP. Describes it as watery and pasty. He reports having about 10 bowel movements a day. Denies fevers or chills. Does endorse weight loss.   Back pain Chronic. Has been better recently. He has flare ups of back pain. He feels it gets worse with stress or when he is overwhelmed/overworked.   DM Drinks a lot of sodas. Acknowledges sugars will be high today. Lately has been forgetting to check sugars at night. Checks in the morning though- numbers have been 200 in the morning. This morning was 400 which was higher than usual, reports eating junk food.   Earwax Would like ears to be flushed out today. Feels ears at clogged.   Smoking status reviewed- current every day smoker. Smoking 2-10 cigarettes a day  Review of Systems- see HPI   Objective:  BP 138/80   Pulse 91   Temp 98.6 F (37 C) (Oral)   Wt 236 lb (107 kg)   SpO2 97%   BMI 30.30 kg/m  Vitals and nursing note reviewed  General: well nourished, in no acute distress HEENT: normocephalic, TM's visualized bilaterally, no scleral icterus or conjunctival pallor, no nasal discharge, moist mucous membranes, good dentition without erythema or discharge noted in posterior oropharynx Neck: supple, non-tender, without lymphadenopathy Cardiac: RRR, clear S1 and S2, no murmurs, rubs, or gallops Respiratory: clear to auscultation bilaterally, no increased work of breathing Abdomen: soft, nontender, nondistended, no masses or organomegaly. Bowel sounds present Extremities: no edema or cyanosis. Warm, well perfused. 2+ radial and PT pulses bilaterally Skin: warm and dry, no rashes noted Neuro: alert and oriented, no focal deficits   Assessment & Plan:    Tobacco abuse  Continues to smoke, using nicotine patch to try to quit  -encouraged  cessation  Type 2 diabetes mellitus with diabetic neuropathy (HCC)  Chronic, A1C today 9.7. Per patient he has slacked off on his diet.  -encouraged patient to follow DM diet- handout given -discussed food options at length with him -will likely refer him to nutrition in the future but hold for now -continue current medications regimen -follow up 3 months   Chronic low back pain  Patient with known DJD of lumbar spine, has seen ortho in [place]. Has seen PT. No red flag symptoms.   -follow up w/ ortho as needed  -encouraged tylenol for low back pain -encouraged PT exercises  Diarrhea  Watery bowel movements occurred after recent abx course, concerning for c. Dif. Documented 9 pound weight loss in past month. Doubt this is related to metformin as he has tolerated in the past.   -will check stool for c.dif -follow up w/ patient regarding results    Return in about 3 months (around 05/19/2017).   [name], DO Family Medicine Resident PGY-2

## 2017-02-17 ENCOUNTER — Other Ambulatory Visit: Payer: Self-pay | Admitting: Internal Medicine

## 2017-02-22 ENCOUNTER — Other Ambulatory Visit (INDEPENDENT_AMBULATORY_CARE_PROVIDER_SITE_OTHER): Payer: Medicaid Other

## 2017-02-22 DIAGNOSIS — R197 Diarrhea, unspecified: Secondary | ICD-10-CM

## 2017-02-27 LAB — C DIFFICILE TOXINS A+B W/RFLX

## 2017-03-13 ENCOUNTER — Telehealth: Payer: Self-pay

## 2017-03-13 NOTE — Telephone Encounter (Signed)
Pt [facility] [address] Sunday SpillersSharon T Zala Degrasse, CMA

## 2017-03-14 ENCOUNTER — Ambulatory Visit: Payer: Medicaid Other | Admitting: Podiatry

## 2017-03-15 ENCOUNTER — Other Ambulatory Visit: Payer: Self-pay | Admitting: Family Medicine

## 2017-03-15 DIAGNOSIS — J4540 Moderate persistent asthma, uncomplicated: Secondary | ICD-10-CM

## 2017-03-15 NOTE — Telephone Encounter (Signed)
Referral made 

## 2017-03-19 ENCOUNTER — Other Ambulatory Visit: Payer: Self-pay | Admitting: *Deleted

## 2017-03-19 ENCOUNTER — Telehealth: Payer: Self-pay | Admitting: *Deleted

## 2017-03-19 NOTE — Telephone Encounter (Signed)
He can take Metformin 1 tablet twice daily until he gets home, this will be fine. Please let him know. Thank you.

## 2017-03-19 NOTE — Telephone Encounter (Signed)
Pt left message on nurse line.  States that he is in [place] and will run out of medication on Saturday evening.  He will not be returning to [facility] on Saturday, but his pharmacy does not deliver until Monday, so he will be out of medication for Saturday.  Wants to know if he should change how he takes the meds so he will not run out.     I called to get clarification since the script should be a 3 month supply according to last refill. Pt states that they did not give him a 3 month supply at last delivery.  Advised I would forward to MD for advice. Ashlan Dignan, Maryjo RochesterJessica Dawn, CMA

## 2017-03-19 NOTE — Telephone Encounter (Signed)
[facility]. Dylan FeilL. Kaavya Puskarich, RN, BSN

## 2017-03-19 NOTE — Telephone Encounter (Signed)
Patient informed.  Dylan Jordan Dawn, CMA  

## 2017-03-20 MED ORDER — METFORMIN HCL 500 MG PO TABS: ORAL_TABLET | ORAL | 11 refills | Status: DC

## 2017-03-22 ENCOUNTER — Ambulatory Visit: Payer: Medicaid Other | Admitting: Podiatry

## 2017-03-27 ENCOUNTER — Ambulatory Visit: Payer: Medicaid Other | Admitting: Family Medicine

## 2017-03-29 ENCOUNTER — Ambulatory Visit: Payer: Medicaid Other | Admitting: Podiatry

## 2017-04-11 ENCOUNTER — Other Ambulatory Visit: Payer: Medicaid Other

## 2017-04-13 ENCOUNTER — Other Ambulatory Visit: Payer: Self-pay

## 2017-04-13 ENCOUNTER — Encounter: Payer: Self-pay | Admitting: Podiatry

## 2017-04-13 ENCOUNTER — Ambulatory Visit: Payer: Medicaid Other | Admitting: Internal Medicine

## 2017-04-13 ENCOUNTER — Encounter: Payer: Self-pay | Admitting: Internal Medicine

## 2017-04-13 ENCOUNTER — Ambulatory Visit: Payer: Medicaid Other | Admitting: Podiatry

## 2017-04-13 VITALS — BP 118/78 | HR 98 | Temp 97.2°F | Ht 74.0 in | Wt 232.8 lb

## 2017-04-13 DIAGNOSIS — B351 Tinea unguium: Secondary | ICD-10-CM

## 2017-04-13 DIAGNOSIS — L84 Corns and callosities: Secondary | ICD-10-CM | POA: Diagnosis not present

## 2017-04-13 DIAGNOSIS — T434X5A Adverse effect of butyrophenone and thiothixene neuroleptics, initial encounter: Secondary | ICD-10-CM | POA: Diagnosis present

## 2017-04-13 DIAGNOSIS — E1142 Type 2 diabetes mellitus with diabetic polyneuropathy: Secondary | ICD-10-CM | POA: Diagnosis not present

## 2017-04-13 DIAGNOSIS — G2402 Drug induced acute dystonia: Secondary | ICD-10-CM

## 2017-04-13 DIAGNOSIS — M79609 Pain in unspecified limb: Secondary | ICD-10-CM

## 2017-04-13 NOTE — Progress Notes (Signed)
   [facility] Dylan CharsAsiyah Thaila Bottoms, MD Phone: [phone]  Reason For Visit: SDA to Reaction to haloperidol shot  Patient with who currently takes an antipsychotic presenting feeling funny after his Haldol shot.  Patient states that he takes Haldol 400 mg once a month and he had his shot on Tuesday.  He states following that shot he is been feeling more confused.  He states he is not walking the way he normally does.  He states that he shuffles with his walker which he does not usually do.  He comes in today because he is worried that he has pneumonia.  He states that he had pneumonia several months ago is just worried he has a return of it and that is what is because the symptoms.  He denies any cough, fevers, sputum production, shortness of breath, heart chest pain, abdominal pain, nausea, vomiting.  He denies any focal weakness, states he feels weak all over.  Denies any focal tingling or numbness.  Indicates his speech feels more slurred than normal although he does have a history of slurred speech.  Denies any headaches.  Denies any trauma.  Past Medical History Reviewed problem list.  Medications- reviewed and updated No additions to family history Social history- patient is a smoker  Objective: BP 118/78   Pulse 98   Temp (!) 97.2 F (36.2 C) (Oral)   Ht 6\' 2"  (1.88 m)   Wt 232 lb 12.8 oz (105.6 kg)   SpO2 97%   BMI 29.89 kg/m  Gen: NAD, alert, cooperative with exam HEENT: Normal    Neck: No masses palpated. 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 Wonda Oldsiccio and  does not appear to be new Gait sometimes with a shuffle, though at other times able to walk normally Cn 2-7 intact Strength and sensation equal & normal in upper & lower extremities Romberg normal, finger to nose    Assessment/Plan: See problem based a/p  Haloperidol adverse reaction Given history and timeline of events likely patient had an adverse reaction to his haloperidol shot.  His gait is consistent with a haloperidol shuffle.  His neurologic exam is otherwise taking intact he has excellent strength in all his extremities.   -Dr. McDiarmid was available and also evaluated patient and agreed with this diagnosis -Per discussion with Dr. McDiarmid patient likely needs to decrease dose of haloperidol -medication will simply have to wear off with time  -Discussed return precautions -Patient to follow-up with PCP in the next week

## 2017-04-13 NOTE — Patient Instructions (Addendum)
It was nice to meet you today.  I believe that the symptoms you have been having are due to your Haldol.  I would discuss with your doctor decreasing the amount of Haldol as you have had a significant amount of side effects from it.  Likely your gait will improve as the dose of Haldol decreases in your blood.  If you develop significant focal weakness worsening numbness please go to the ED.  Please follow-up with your primary care physician regarding your blood sugars

## 2017-04-13 NOTE — Progress Notes (Signed)
Complaint:  Visit Type: Patient returns to my office for continued preventative foot care services. Complaint: Patient states" my nails have grown long and thick and become painful to walk and wear shoes" The patient presents for preventative foot care services. No changes to ROS.  Patient also says his callus on big toes need attention.  Podiatric Exam: Vascular: dorsalis pedis and posterior tibial pulses are palpable bilateral. Capillary return is immediate. Temperature gradient is WNL. Skin turgor WNL  Sensorium: Normal Semmes Weinstein monofilament test. Normal tactile sensation bilaterally. Nail Exam: Pt has thick disfigured discolored nails with subungual debris noted bilateral entire nail hallux through fifth toenails Ulcer Exam: There is no evidence of ulcer or pre-ulcerative changes or infection. Orthopedic Exam: Muscle tone and strength are WNL. No limitations in general ROM. No crepitus or effusions noted. HAV  B/L. Skin: No Porokeratosis. No infection or ulcers. Asymptomatic   Pinch callus hallux  B/L  Diagnosis:  Onychomycosis, , Pain in right toe, pain in left toes,   pinch callus. B/L  Treatment & Plan Procedures and Treatment: Consent by patient was obtained for treatment procedures. The patient understood the discussion of treatment and procedures well. All questions were answered thoroughly reviewed. Debridement of mycotic and hypertrophic toenails, 1 through 5 bilateral and clearing of subungual debris. No ulceration, no infection noted. Debride callus   RTC 9 weeks. Return Visit-Office Procedure: Patient instructed to return to the office for a follow up visit when scheduled  for continued evaluation and treatment.    Helane GuntherGregory Pierrette Scheu DPM

## 2017-04-16 ENCOUNTER — Telehealth: Payer: Self-pay | Admitting: Family Medicine

## 2017-04-16 ENCOUNTER — Encounter: Payer: Self-pay | Admitting: Internal Medicine

## 2017-04-16 ENCOUNTER — Other Ambulatory Visit: Payer: Medicaid Other

## 2017-04-16 DIAGNOSIS — T434X5A Adverse effect of butyrophenone and thiothixene neuroleptics, initial encounter: Secondary | ICD-10-CM | POA: Insufficient documentation

## 2017-04-16 NOTE — Telephone Encounter (Signed)
Please let [name] know he should discuss Haldol dose with his NP at his next visit to [name]. Nothing to do at this time. Thanks.

## 2017-04-16 NOTE — Telephone Encounter (Signed)
Pt called and said he couldn't remember if he was supposed to get in touch with his nurse practitioner or if Dr. Cathlean CowerMikell was going to. He seemed confused from his appt on Friday he had. Please give pt a call to clarify.

## 2017-04-16 NOTE — Assessment & Plan Note (Signed)
Given history and timeline of events likely patient had an adverse reaction to his haloperidol shot.  His gait is consistent with a haloperidol shuffle.  His neurologic exam is otherwise taking intact he has excellent strength in all his extremities.   -Dr. McDiarmid was available and also evaluated patient and agreed with this diagnosis -Per discussion with Dr. McDiarmid patient likely needs to decrease dose of haloperidol -medication will simply have to wear off with time  -Discussed return precautions -Patient to follow-up with PCP in the next week

## 2017-04-17 ENCOUNTER — Other Ambulatory Visit: Payer: Self-pay

## 2017-04-17 ENCOUNTER — Emergency Department (HOSPITAL_COMMUNITY): Payer: Medicaid Other

## 2017-04-17 ENCOUNTER — Emergency Department (HOSPITAL_COMMUNITY)
Admission: EM | Admit: 2017-04-17 | Discharge: 2017-04-17 | Disposition: A | Discharge status: Home / Self Care | Payer: Medicaid Other | Attending: Emergency Medicine | Admitting: Emergency Medicine

## 2017-04-17 ENCOUNTER — Encounter (HOSPITAL_COMMUNITY): Payer: Self-pay | Admitting: *Deleted

## 2017-04-17 DIAGNOSIS — Z7982 Long term (current) use of aspirin: Secondary | ICD-10-CM | POA: Insufficient documentation

## 2017-04-17 DIAGNOSIS — J45909 Unspecified asthma, uncomplicated: Secondary | ICD-10-CM | POA: Insufficient documentation

## 2017-04-17 DIAGNOSIS — Z7984 Long term (current) use of oral hypoglycemic drugs: Secondary | ICD-10-CM | POA: Diagnosis not present

## 2017-04-17 DIAGNOSIS — Z79899 Other long term (current) drug therapy: Secondary | ICD-10-CM | POA: Diagnosis not present

## 2017-04-17 DIAGNOSIS — M79671 Pain in right foot: Secondary | ICD-10-CM | POA: Diagnosis not present

## 2017-04-17 DIAGNOSIS — R739 Hyperglycemia, unspecified: Secondary | ICD-10-CM

## 2017-04-17 DIAGNOSIS — Z96652 Presence of left artificial knee joint: Secondary | ICD-10-CM | POA: Diagnosis not present

## 2017-04-17 DIAGNOSIS — E1165 Type 2 diabetes mellitus with hyperglycemia: Secondary | ICD-10-CM | POA: Insufficient documentation

## 2017-04-17 DIAGNOSIS — F17210 Nicotine dependence, cigarettes, uncomplicated: Secondary | ICD-10-CM | POA: Insufficient documentation

## 2017-04-17 LAB — CBC
HEMATOCRIT: 37.8 % — AB (ref 39.0–52.0)
HEMOGLOBIN: 12.2 g/dL — AB (ref 13.0–17.0)
MCH: 29 pg (ref 26.0–34.0)
MCHC: 32.3 g/dL (ref 30.0–36.0)
MCV: 89.8 fL (ref 78.0–100.0)
PLATELETS: 284 10*3/uL (ref 150–400)
RBC: 4.21 MIL/uL — AB (ref 4.22–5.81)
RDW: 13.8 % (ref 11.5–15.5)
WBC: 8.7 10*3/uL (ref 4.0–10.5)

## 2017-04-17 LAB — BASIC METABOLIC PANEL
Anion gap: 11 (ref 5–15)
BUN: 8 mg/dL (ref 6–20)
CHLORIDE: 98 mmol/L — AB (ref 101–111)
CO2: 20 mmol/L — ABNORMAL LOW (ref 22–32)
CREATININE: 1.32 mg/dL — AB (ref 0.61–1.24)
Calcium: 9.9 mg/dL (ref 8.9–10.3)
GFR calc non Af Amer: 57 mL/min — ABNORMAL LOW (ref >60)
Glucose, Bld: 680 mg/dL (ref 65–99)
POTASSIUM: 4.2 mmol/L (ref 3.5–5.1)
SODIUM: 129 mmol/L — AB (ref 135–145)

## 2017-04-17 LAB — CBG MONITORING, ED
GLUCOSE-CAPILLARY: 581 mg/dL — AB (ref 65–99)
GLUCOSE-CAPILLARY: 486 mg/dL — AB (ref 65–99)
GLUCOSE-CAPILLARY: 250 mg/dL — AB (ref 65–99)

## 2017-04-17 LAB — URINALYSIS, ROUTINE W REFLEX MICROSCOPIC
Bacteria, UA: NONE SEEN
Bilirubin Urine: NEGATIVE
Hgb urine dipstick: NEGATIVE
Ketones, ur: NEGATIVE mg/dL
LEUKOCYTES UA: NEGATIVE
Nitrite: NEGATIVE
Protein, ur: NEGATIVE mg/dL
SQUAMOUS EPITHELIAL / LPF: NONE SEEN
Specific Gravity, Urine: 1.014 (ref 1.005–1.030)
pH: 6 (ref 5.0–8.0)

## 2017-04-17 MED ORDER — CEPHALEXIN 500 MG PO CAPS: 500.0000 mg | ORAL_CAPSULE | Freq: Two times a day (BID) | ORAL | 0 refills | Status: DC

## 2017-04-17 MED ORDER — INSULIN ASPART 100 UNIT/ML ~~LOC~~ SOLN
10.0000 [IU] | Freq: Once | SUBCUTANEOUS | Status: AC
  Administered 2017-04-17: 10 [IU] via INTRAVENOUS
  Filled 2017-04-17: qty 1

## 2017-04-17 MED ORDER — INSULIN ASPART 100 UNIT/ML ~~LOC~~ SOLN
8.0000 [IU] | Freq: Once | SUBCUTANEOUS | Status: DC
Start: 2017-04-17 — End: 2017-04-17

## 2017-04-17 MED ORDER — SODIUM CHLORIDE 0.9 % IV BOLUS (SEPSIS)
1000.0000 mL | Freq: Once | INTRAVENOUS | Status: AC
Start: 2017-04-17 — End: 2017-04-17
  Administered 2017-04-17: 1000 mL via INTRAVENOUS

## 2017-04-17 MED ORDER — KETOROLAC TROMETHAMINE 30 MG/ML IJ SOLN: 30.0000 mg | Freq: Once | INTRAMUSCULAR | Status: DC

## 2017-04-17 MED ORDER — OXYCODONE-ACETAMINOPHEN 5-325 MG PO TABS
1.0000 | ORAL_TABLET | Freq: Once | ORAL | Status: AC
  Administered 2017-04-17: 1 via ORAL
  Filled 2017-04-17: qty 1

## 2017-04-17 MED ORDER — KETOROLAC TROMETHAMINE 30 MG/ML IJ SOLN
15.0000 mg | Freq: Once | INTRAMUSCULAR | Status: AC
  Administered 2017-04-17: 15 mg via INTRAVENOUS
  Filled 2017-04-17: qty 1

## 2017-04-17 MED ORDER — INDOMETHACIN 50 MG PO CAPS: 50.0000 mg | ORAL_CAPSULE | Freq: Two times a day (BID) | ORAL | 0 refills | Status: DC

## 2017-04-17 MED ORDER — ACETAMINOPHEN 325 MG PO TABS
650.0000 mg | ORAL_TABLET | Freq: Once | ORAL | Status: AC
  Administered 2017-04-17: 650 mg via ORAL
  Filled 2017-04-17: qty 2

## 2017-04-17 MED ORDER — HYDROCODONE-ACETAMINOPHEN 5-325 MG PO TABS: 1.0000 | ORAL_TABLET | Freq: Four times a day (QID) | ORAL | 0 refills | Status: DC | PRN

## 2017-04-17 NOTE — ED Provider Notes (Signed)
[name] EMERGENCY DEPARTMENT Provider Note   CSN: [id number] Arrival date & time: 04/17/17  1359     History   Chief Complaint Chief Complaint  Patient presents with  . Hyperglycemia  . Foot Pain    HPI Dylan Jordan is a 61 y.o. male.  HPI Dylan Jordan is a 61 y.o. male with history of asthma, poorly controlled diabetes, hypertension, drug disorder, presents to emergency department complaining of the foot pain.  Patient states he woke up 3 days ago with severe pain to his right great toe.  He denies any known injuries.  Denies any open wounds or drainage.  States the toe is very painful to the touch and pain worsened with movement.  He states that he remembers having similar pain in the past, states "about 4 years ago and they gave him his steroids."  Patient has not taken anything for pain at home.  Patient also admitted that he just bought new boots that felt tight when he was wearing them "the other day" and may be the cause of his pain and swelling in the foot.  Patient is also here with elevated blood sugar over 600.  States he is compliant with his medications.  Past Medical History:  Diagnosis Date  . Asthma   . Diabetes mellitus without complication [facility])     Patient Active Problem List   Diagnosis Date Noted  . Haloperidol adverse reaction 04/16/2017  . Diarrhea 02/16/2017  . Community acquired pneumonia of right lower lobe of lung (HCC) 01/19/2017  . Bilateral impacted cerumen 01/19/2017  . Chronic low back pain 01/03/2017  . Chronic cough 06/30/2016  . Drug-induced mood disorder (HCC) 06/14/2016  . Type 2 diabetes mellitus with diabetic neuropathy (HCC) 05/26/2016  . Iron deficiency anemia 05/26/2016  . Chronic kidney disease 05/26/2016  . HTN (hypertension) 05/26/2016  . Tobacco abuse 05/26/2016  . History of substance abuse 05/26/2016  . History of alcohol abuse 05/26/2016  . Asthma 05/26/2016    Past Surgical History:  Procedure  Laterality Date  . REPLACEMENT TOTAL KNEE Left   . TOE SURGERY         Home Medications    Prior to Admission medications   Medication Sig Start Date End Date Taking? Authorizing Provider  acetaminophen (TYLENOL) 650 MG CR tablet Take 650 mg by mouth 2 (two) times daily.    [provider]  amLODipine (NORVASC) 5 MG tablet Take 1 tablet (5 mg total) by mouth daily. 11/06/16   Tillman Sers, DO  ammonium lactate (AMLACTIN) 12 % cream Apply topically as needed for dry skin. Patient taking differently: Apply 1 g topically as needed for dry skin.  07/26/16   Felecia Shelling, DPM  aspirin EC 81 MG tablet Take 1 tablet (81 mg total) by mouth daily. 08/04/16   Tillman Sers, DO  atorvastatin (LIPITOR) 20 MG tablet take 1 TABLET BY MOUTH EVERY DAY 01/22/17   [name] C, DO  benztropine (COGENTIN) 2 MG tablet Take 2 mg by mouth 2 (two) times daily.    [provider]  cetirizine (ZYRTEC) 10 MG tablet Take 1 tablet (10 mg total) by mouth daily. 01/10/17   Tillman Sers, DO  cholecalciferol (VITAMIN D) 1000 units tablet Take 1,000 Units by mouth daily.    [provider]  Cyanocobalamin (VITAMIN B-12) 6000 MCG SUBL Place 6,000 mcg under the tongue daily.    [provider]  diclofenac (VOLTAREN) 75 MG EC tablet  Take 1 tablet (75 mg total) by mouth 2 (two) times daily. Patient not taking: Reported on 01/09/2017 09/26/16   Tarry Kos, MD  diclofenac sodium (VOLTAREN) 1 % GEL Apply 4 g topically 4 (four) times daily. 01/01/17   Mayo, Allyn Kenner, MD  divalproex (DEPAKOTE) 500 MG DR tablet Take 500-2,000 mg by mouth See admin instructions. Take 500mg  every morning and 2000 tabs every evening    [provider]  ferrous sulfate (FERROUSUL) 325 (65 FE) MG tablet Take 1 tablet (325 mg total) by mouth daily with breakfast. 01/10/17   Wonda Olds, Marcell Anger, DO  fluticasone (FLONASE) 50 MCG/ACT nasal spray Place 2 sprays into both nostrils daily. Patient taking  differently: Place 2 sprays into both nostrils daily as needed for allergies.  12/05/16   Tillman Sers, DO  Fluticasone-Salmeterol (ADVAIR DISKUS) 250-50 MCG/DOSE AEPB Inhale 1 puff into the lungs 2 (two) times daily. Patient not taking: Reported on 01/09/2017 07/19/16   Tillman Sers, DO  gabapentin (NEURONTIN) 300 MG capsule Take 1 capsule (300 mg total) by mouth 3 (three) times daily as needed. 02/19/17   Tillman Sers, DO  glimepiride (AMARYL) 4 MG tablet Take 1 tablet (4 mg total) by mouth 2 (two) times daily. 12/01/16   Almon Hercules, MD  haloperidol (HALDOL) 10 MG tablet Take 10 mg by mouth every evening.     [provider]  haloperidol decanoate (HALDOL DECANOATE) 100 MG/ML injection Inject 400 mg into the muscle every 28 (twenty-eight) days.    [provider]  HYDROcodone-acetaminophen (NORCO/VICODIN) 5-325 MG tablet Take 1 tablet by mouth 2 (two) times daily. Patient not taking: Reported on 01/09/2017 11/07/16   Tarry Kos, MD  ibuprofen (ADVIL,MOTRIN) 200 MG tablet Take 200 mg by mouth every 6 (six) hours as needed for mild pain.     [provider]  lithium carbonate 300 MG capsule Take 300 mg by mouth 2 (two) times daily with a meal.    [provider]  losartan (COZAAR) 25 MG tablet TAKE 1 TABLET BY MOUTH EVERY DAY Patient taking differently: TAKE 25mg  BY MOUTH EVERY DAY 10/03/16   Garth Bigness, MD  metFORMIN (GLUCOPHAGE) 500 MG tablet Take 2 TABLET TWICE DAILY with meals 03/20/17   [name], [name], DO  montelukast (SINGULAIR) 10 MG tablet take 1 TABLET BY MOUTH EVERY DAY 01/22/17   [name] C, DO  Multiple Vitamin (DAILY-VITE) TABS TAKE 1 TABLET BY MOUTH DAILY. 10/03/16   Garth Bigness, MD  nicotine (NICODERM CQ - DOSED IN MG/24 HOURS) 21 mg/24hr patch Place 1 patch (21 mg total) onto the skin daily. 02/16/17   Tillman Sers, DO  omeprazole (PRILOSEC) 20 MG capsule Take 2 capsules (40 mg total) by mouth daily. 01/10/17    Tillman Sers, DO  traMADol (ULTRAM) 50 MG tablet Take 1 tablet (50 mg total) by mouth 2 (two) times daily. Patient not taking: Reported on 01/09/2017 10/06/16   Tarry Kos, MD    Family History Family History  Problem Relation Age of Onset  . Allergic rhinitis Neg Hx   . Angioedema Neg Hx   . Asthma Neg Hx   . Atopy Neg Hx   . Eczema Neg Hx   . Immunodeficiency Neg Hx   . Urticaria Neg Hx     Social History Social History   Tobacco Use  . Smoking status: Current Every Day Smoker    Packs/day: 1.00    Years: 45.00  Pack years: 45.00    Types: Cigarettes    Start date: 05/16/1971  . Smokeless tobacco: Never Used  . Tobacco comment: Now smokes about 3 cigarettes a week  Substance Use Topics  . Alcohol use: No  . Drug use: No     Allergies   Ativan [lorazepam] and Penicillins   Review of Systems Review of Systems  Constitutional: Negative for chills and fever.  Respiratory: Negative for cough, chest tightness and shortness of breath.   Cardiovascular: Negative for chest pain, palpitations and leg swelling.  Gastrointestinal: Negative for abdominal distention, abdominal pain, diarrhea, nausea and vomiting.  Genitourinary: Negative for dysuria, frequency, hematuria and urgency.  Musculoskeletal: Positive for arthralgias and joint swelling. Negative for myalgias, neck pain and neck stiffness.  Skin: Positive for color change. Negative for rash and wound.  Allergic/Immunologic: Negative for immunocompromised state.  Neurological: Negative for dizziness, weakness, light-headedness, numbness and headaches.     Physical Exam Updated Vital Signs BP 126/87 (BP Location: Left Arm)   Pulse 87   Temp (!) 97.2 F (36.2 C) (Oral)   Resp 18   SpO2 99%   Physical Exam  Constitutional: He appears well-developed and well-nourished. No distress.  HENT:  Head: Normocephalic and atraumatic.  Eyes: Conjunctivae are normal.  Neck: Neck supple.  Cardiovascular: Normal  rate, regular rhythm and normal heart sounds.  Pulmonary/Chest: Effort normal. No respiratory distress. He has no wheezes. He has no rales.  Abdominal: Soft. Bowel sounds are normal. He exhibits no distension. There is no tenderness. There is no rebound.  Musculoskeletal: He exhibits no edema.  Erythema to the right first MTP joint with diffuse tenderness to palpation over the joint.  Tenderness even to the light touch.  Pain in the knee range of motion of the joint.  No wounds, sores, lesions to the foot.  Fungal infection to the toenails present.  Neurological: He is alert.  Skin: Skin is warm and dry.  Nursing note and vitals reviewed.    ED Treatments / Results  Labs (all labs ordered are listed, but only abnormal results are displayed) Labs Reviewed  BASIC METABOLIC PANEL - Abnormal; Notable for the following components:      Result Value   Sodium 129 (*)    Chloride 98 (*)    CO2 20 (*)    Glucose, Bld 680 (*)    Creatinine, Ser 1.32 (*)    GFR calc non Af Amer 57 (*)    All other components within normal limits  CBC - Abnormal; Notable for the following components:   RBC 4.21 (*)    Hemoglobin 12.2 (*)    HCT 37.8 (*)    All other components within normal limits  URINALYSIS, ROUTINE W REFLEX MICROSCOPIC - Abnormal; Notable for the following components:   Color, Urine STRAW (*)    Glucose, UA >=500 (*)    All other components within normal limits  CBG MONITORING, ED - Abnormal; Notable for the following components:   Glucose-Capillary 581 (*)    All other components within normal limits    EKG  EKG Interpretation None       Radiology Dg Foot Complete Right  Result Date: 04/17/2017 CLINICAL DATA:  Right foot pain at the first and second MTP joints. Pain has been ongoing for 5 days. History of prior toe surgery. EXAM: RIGHT FOOT COMPLETE - 3+ VIEW COMPARISON:  None. FINDINGS: No acute fracture, periostitis, malalignment nor bone destruction. Postop appearance of  the head of the  fifth proximal phalanx. No abnormal soft tissue mass or mineralization. Prominent plantar calcaneal enthesophyte. There is mild soft tissue swelling of the forefoot. IMPRESSION: 1. Nonspecific mild soft tissue swelling of the forefoot. No underlying acute fracture, periosteal reaction or bone destruction. 2. Plantar calcaneal enthesophyte. Electronically Signed   By: Tollie Ethavid  Kwon M.D.   On: 04/17/2017 18:01    Procedures Procedures (including critical care time)  Medications Ordered in ED Medications  sodium chloride 0.9 % bolus 1,000 mL (not administered)  insulin aspart (novoLOG) injection 10 Units (not administered)  oxyCODONE-acetaminophen (PERCOCET/ROXICET) 5-325 MG per tablet 1 tablet (not administered)     Initial Impression / Assessment and Plan / ED Course  I have reviewed the triage vital signs and the nursing notes.  Pertinent labs & imaging results that were available during my care of the patient were reviewed by me and considered in my medical decision making (see chart for details).     Patient in the emergency department with severe pain to the right first MTP joint.  No injuries reported.  Patient's glucose is 680 as well.  I will administer IV fluids, give insulin to lower the blood sugar.  His anion gap was normal.  No evidence of DKA.  Will image the foot and give Percocet for pain.   6:29 PM Glucose 486. Will give another liter of fluids and more insulin. Discussed results with pt. Plan to dc with pain medications and antibiotics and fu with pcp.   8:45 PM Glucose down to 250. Will dc home. Return precautions discussed.   Vitals:   04/17/17 1900 04/17/17 2000 04/17/17 2100 04/17/17 2153  BP: 135/83 123/80 125/83 125/83  Pulse: 68 71 (!) 56 62  Resp:   16 16  Temp:    97.7 F (36.5 C)  TempSrc:    Oral  SpO2: 98% 97% 98% 99%     Final Clinical Impressions(s) / ED Diagnoses   Final diagnoses:  Right foot pain  Hyperglycemia    ED  Discharge Orders        Ordered    indomethacin (INDOCIN) 50 MG capsule  2 times daily with meals     04/17/17 2138    HYDROcodone-acetaminophen (NORCO) 5-325 MG tablet  Every 6 hours PRN     04/17/17 2138    cephALEXin (KEFLEX) 500 MG capsule  2 times daily     04/17/17 2139       Jaynie CrumbleKirichenko, Ophelia Sipe, PA-C 04/17/17 2305    Tilden Fossaees, Elizabeth, MD 04/19/17 1257

## 2017-04-17 NOTE — ED Notes (Signed)
Pt given a piece of [place] meat (no bread), per Lemont Fillersatyana - PA.

## 2017-04-17 NOTE — ED Notes (Signed)
Pt's CBG result was 250. Informed Kim - RN.

## 2017-04-17 NOTE — ED Notes (Signed)
Pt asking for food. Informed Tatyana - PA.

## 2017-04-17 NOTE — ED Notes (Signed)
Gave pt a Diet Coke, per Selena BattenKim - RN.

## 2017-04-17 NOTE — Discharge Instructions (Signed)
Take indocin as prescribed for pain and inflammation. Take norco for severe pain. Keflex for possible infection. Drink plenty of fluids. Follow up with your doctor closely to make sure you are improving.

## 2017-04-17 NOTE — ED Notes (Signed)
Pt taken to Xray.

## 2017-04-17 NOTE — ED Notes (Signed)
Pt hollering at staff from room across nursing station asking for pain medications, states legs are cramping; Dylan Jordan aware; will order to have CBG checked and if less than 300, plan to go home

## 2017-04-17 NOTE — Telephone Encounter (Signed)
VM left on patients line informing him to contact his NP. Call back number was left if he has any further questions.

## 2017-04-17 NOTE — ED Triage Notes (Signed)
[place] [facility] [place] Reports having bunions and severe foot pain. Also reports not checked cbg at home, per ems it was 597. Pt has excessive thirst and requesting soda on arrival.

## 2017-04-18 ENCOUNTER — Telehealth: Payer: Self-pay | Admitting: *Deleted

## 2017-04-18 NOTE — Telephone Encounter (Signed)
Pharmacy called related to Rx; pt misplaced hardcopy Pharm D requesting verbal ..Marland Kitchen.[facility]- [place] Rx not given as it is not allowed. Jimmie Rueter J. Lucretia RoersWood, RN, BSN, [place] [phone]

## 2017-04-23 ENCOUNTER — Ambulatory Visit: Payer: Medicaid Other | Admitting: Family Medicine

## 2017-04-26 ENCOUNTER — Other Ambulatory Visit: Payer: Self-pay | Admitting: Family Medicine

## 2017-05-04 ENCOUNTER — Ambulatory Visit: Payer: Medicaid Other | Admitting: Family Medicine

## 2017-05-04 ENCOUNTER — Encounter: Payer: Self-pay | Admitting: Family Medicine

## 2017-05-04 ENCOUNTER — Other Ambulatory Visit: Payer: Medicaid Other

## 2017-05-04 ENCOUNTER — Telehealth: Payer: Self-pay | Admitting: Family Medicine

## 2017-05-04 ENCOUNTER — Other Ambulatory Visit: Payer: Self-pay

## 2017-05-04 VITALS — BP 126/80 | HR 80 | Temp 97.8°F | Wt 229.0 lb

## 2017-05-04 DIAGNOSIS — K649 Unspecified hemorrhoids: Secondary | ICD-10-CM

## 2017-05-04 DIAGNOSIS — R21 Rash and other nonspecific skin eruption: Secondary | ICD-10-CM | POA: Diagnosis not present

## 2017-05-04 DIAGNOSIS — E1140 Type 2 diabetes mellitus with diabetic neuropathy, unspecified: Secondary | ICD-10-CM | POA: Diagnosis not present

## 2017-05-04 DIAGNOSIS — R197 Diarrhea, unspecified: Secondary | ICD-10-CM | POA: Diagnosis not present

## 2017-05-04 LAB — POCT GLYCOSYLATED HEMOGLOBIN (HGB A1C): Hemoglobin A1C: 13.5

## 2017-05-04 MED ORDER — EMPAGLIFLOZIN 10 MG PO TABS: 10.0000 mg | ORAL_TABLET | Freq: Every day | ORAL | 2 refills | Status: DC

## 2017-05-04 MED ORDER — PHENYLEPHRINE-MINERAL OIL-PET 0.25-14-74.9 % RE OINT
1.0000 | TOPICAL_OINTMENT | Freq: Two times a day (BID) | RECTAL | 2 refills | Status: DC | PRN
Start: 2017-05-04 — End: 2017-11-22

## 2017-05-04 NOTE — Telephone Encounter (Signed)
Entered in error.  Byan Poplaski,CMA  

## 2017-05-04 NOTE — Assessment & Plan Note (Signed)
  Chronic, poorly controlled. A1C todayt 13.5, likely due to large amount of soda consumption  -precepted w/ pharmacist Dr. Raymondo BandKoval who advised adding 10 mg jardiance daily, patient agreeable -emphasized no soda, could try flavored carbonated water instead -discussed dietary changes -continue amaryl and metformin -follow up 1 month

## 2017-05-04 NOTE — Assessment & Plan Note (Signed)
  Patient concerned for bed bug bite but doesn't appear consistent, appears to be an abrasion on his shoulder  -reassurance provided -informed patient if he sees multiple bites on body to return to be seen

## 2017-05-04 NOTE — Assessment & Plan Note (Addendum)
  Likely due to diarrhea frequent bowel movements, irritation. No blood in stool. Advised stool softener  -rx for prep H given per patient preference

## 2017-05-04 NOTE — Progress Notes (Signed)
    Subjective:    Patient ID: Dylan Jordan, male    DOB: May 10, 1956, 61 y.o.   MRN: [id number]  CC: worried he has bed bugs  Bed bug concern Has 1 area of concern for bite on shoulder. Got old piece of furniture used from church, sprayed it with roach spray and chlorox.   DM -A1C today 13.5. -has been trying to cut down on sodas, but reports drinking 6 twenty ounce sodas a day -has been snacking a lot, crackers -taking amaryl 4mg  twice a day -And metformin 1000 mg twice a day -endorses blurred vision, increased thirst, increased urination  Diarrhea -wants to be checked for c.dif -going at least 9 times a day -pasty stools w/ dark flecks in it, soupy -sometimes it is watery  Hemorrhoids- itching, burning. No stool softener. Niece got him prep H but he hasn't used it yet. No blood in stool.   Smoking status reviewed- current smoker trying to quit  Review of Systems- no chest pain, SOB, focal weakness, confusion, lethargy   Objective:  BP 126/80   Pulse 80   Temp 97.8 F (36.6 C) (Oral)   Wt 229 lb (103.9 kg)   SpO2 96%   BMI 29.40 kg/m  Vitals and nursing note reviewed  General: well nourished, in no acute distress Cardiac: RRR, clear S1 and S2, no murmurs, rubs, or gallops Respiratory: clear to auscultation bilaterally, no increased work of breathing Extremities: no edema or cyanosis.  Skin: warm and dry, small abrasion on left shoulder Neuro: alert and oriented, no focal deficits   Assessment & Plan:    Type 2 diabetes mellitus with diabetic neuropathy (HCC)  Chronic, poorly controlled. A1C todayt 13.5, likely due to large amount of soda consumption  -precepted w/ pharmacist Dr. Raymondo BandKoval who advised adding 10 mg jardiance daily, patient agreeable -emphasized no soda, could try flavored carbonated water instead -discussed dietary changes -continue amaryl and metformin -follow up 1 month   Rash and nonspecific skin eruption  Patient concerned for bed bug  bite but doesn't appear consistent, appears to be an abrasion on his shoulder  -reassurance provided -informed patient if he sees multiple bites on body to return to be seen  Diarrhea  Patient concerned for c. Dif. Stools do not sound consistent with c. Dif. Unclear etiology. Could be related to poor diet. Would consider GI referral in future.   -will order GI pathogen panel which includes C dif PCR -patient supplied with collection materials to bring stool sample in  Hemorrhoids  Likely due to diarrhea frequent bowel movements, irritation. No blood in stool. Advised stool softener  -rx for prep H given per patient preference  Return in about 4 weeks (around 06/01/2017) for DM.   Dolores PattyAngela Deserea Bordley, DO Family Medicine Resident PGY-2

## 2017-05-04 NOTE — Patient Instructions (Addendum)
   It was nice to see you today.  PLEASE stop drinking sodas entirely. Your A1C is 13.5 today.   I sent in a prescription for Jardiance 10 mg, please let me know if you have trouble getting this medicine or taking it.   Please check your blood sugars more frequently and bring these with you to your next appointment.   You can drop off a stool sample to our lab at your convenience.   If you have questions or concerns please do not hesitate to call at [phone].  Dolores PattyAngela Aceyn Kathol, DO PGY-2, [facility] 05/04/2017 12:30 PM

## 2017-05-04 NOTE — Assessment & Plan Note (Addendum)
  Patient concerned for c. Dif. Stools do not sound consistent with c. Dif. Unclear etiology. Could be related to poor diet. Would consider GI referral in future.   -will order GI pathogen panel which includes C dif PCR -patient supplied with collection materials to bring stool sample in

## 2017-05-18 ENCOUNTER — Ambulatory Visit: Payer: Medicaid Other | Admitting: Family Medicine

## 2017-05-18 ENCOUNTER — Other Ambulatory Visit: Payer: Self-pay

## 2017-05-18 ENCOUNTER — Encounter: Payer: Self-pay | Admitting: Family Medicine

## 2017-05-18 ENCOUNTER — Other Ambulatory Visit: Payer: Self-pay | Admitting: Family Medicine

## 2017-05-18 DIAGNOSIS — M1A9XX0 Chronic gout, unspecified, without tophus (tophi): Secondary | ICD-10-CM | POA: Insufficient documentation

## 2017-05-18 DIAGNOSIS — R197 Diarrhea, unspecified: Secondary | ICD-10-CM

## 2017-05-18 DIAGNOSIS — M109 Gout, unspecified: Secondary | ICD-10-CM | POA: Diagnosis present

## 2017-05-18 MED ORDER — ALLOPURINOL 100 MG PO TABS: 100.0000 mg | ORAL_TABLET | Freq: Every day | ORAL | 0 refills | Status: DC

## 2017-05-18 MED ORDER — COLCHICINE 0.6 MG PO TABS: 0.6000 mg | ORAL_TABLET | Freq: Every day | ORAL | 0 refills | Status: DC

## 2017-05-18 NOTE — Progress Notes (Signed)
   Subjective:    Patient ID: Dylan Jordan, male    DOB: September 07, 1955, 62 y.o.   MRN: [id number]   CC: Right foot pain  HPI: Patient is a 62 year old male presenting today complaining of right foot pain.  Patient reports that he was seen in the ED about a month ago for similar symptoms and was given antibiotics and some pain medication.  Patient reports that symptoms have not resolved since.  Patient states that he has discusses symptoms with his niece who thinks that he may have gout.  Patient has had similar presentation in the past but does not remember diagnosis of gout.  Patient states that pain is 10/10.  Patient has not tried any medication for his pain.  Patient is having difficulty ambulating due to pain.  Patient denies any fever, chills, shortness of breath, chest pain, nausea or vomiting.  Smoking status reviewed   ROS: all other systems were reviewed and are negative other than in the HPI   Past Medical History:  Diagnosis Date  . Asthma   . Diabetes mellitus without complication [facility](HCC)     Past Surgical History:  Procedure Laterality Date  . REPLACEMENT TOTAL KNEE Left   . TOE SURGERY      Past medical history, surgical, family, and social history reviewed and updated in the EMR as appropriate.  Objective:  BP 118/68   Pulse 81   Temp 97.9 F (36.6 C) (Oral)   Ht 6\' 2"  (1.88 m)   Wt 223 lb (101.2 kg)   SpO2 (!) 81%   BMI 28.63 kg/m   Vitals and nursing note reviewed       General: NAD, pleasant, able to participate in exam Cardiac: RRR, normal heart sounds, no murmurs. 2+ radial and PT pulses bilaterally Respiratory: CTAB, normal effort, No wheezes, rales or rhonchi Abdomen: soft, nontender, nondistended, no hepatic or splenomegaly, +BS Extremities: Right MTP erythematous and swollen.  Limited range of motion due to pain.  Right sole tenderness noted with palpation.  No drainage, fluctuance or bleeding noted.  Left foot exam is normal. Skin: warm and dry,  no rashes noted Neuro: alert and oriented x4, no focal deficits Psych: Normal affect and mood   Assessment & Plan:   #Right MTP swelling, acute, worsening Patient presents with right MTP swelling with erythema and severe pain.  Presentation is consistent with gout attack.  Patient has not tried any NSAIDs in the past few days, although he was given indomethacin in the ED on 12/4.  Low level of suspicion for cellulitis given localized erythema and swelling.  Patient endorses protein rich diet as well as similar attacks in the past although patient does not remember a formal diagnosis of gout.  We will treat as acute gout flareup.  Patient was probably need to be on suppressive therapy going forward. --Prescribe colchicine 0.6 mg 6 tabs.  1.2 mg followed by 0.6 mg today and tomorrow  --Prescribe allopurinol 100 mg once daily after acute flare has resolved --Order uric acid level --Patient will follow up with PCP in the next few days   Lovena NeighboursAbdoulaye Arianna Delsanto, MD [facility] PGY-2

## 2017-05-18 NOTE — Progress Notes (Signed)
Colchicine denied by insurance. Instructed Mr Dylan Jordan to take 800 mg ibuprofen TID until flare resolves, typically 5-7 days, then start allopurinol. Follow up as needed. He agrees and understands.  [name], DO PGY-2, [facility] 05/18/2017 5:00 PM

## 2017-05-18 NOTE — Addendum Note (Signed)
Addended by: Jennette BillBUSICK, Meeka Cartelli L on: 05/18/2017 02:38 PM   Modules accepted: Orders

## 2017-05-18 NOTE — Patient Instructions (Addendum)

## 2017-05-19 ENCOUNTER — Other Ambulatory Visit: Payer: Self-pay

## 2017-05-19 ENCOUNTER — Encounter (HOSPITAL_COMMUNITY): Payer: Self-pay | Admitting: *Deleted

## 2017-05-19 ENCOUNTER — Emergency Department (HOSPITAL_COMMUNITY)
Admission: EM | Admit: 2017-05-19 | Discharge: 2017-05-19 | Disposition: A | Discharge status: Home / Self Care | Payer: Medicaid Other | Source: Home / Self Care | Attending: Emergency Medicine | Admitting: Emergency Medicine

## 2017-05-19 DIAGNOSIS — F17210 Nicotine dependence, cigarettes, uncomplicated: Secondary | ICD-10-CM | POA: Insufficient documentation

## 2017-05-19 DIAGNOSIS — J45909 Unspecified asthma, uncomplicated: Secondary | ICD-10-CM

## 2017-05-19 DIAGNOSIS — E1122 Type 2 diabetes mellitus with diabetic chronic kidney disease: Secondary | ICD-10-CM

## 2017-05-19 DIAGNOSIS — N189 Chronic kidney disease, unspecified: Secondary | ICD-10-CM | POA: Insufficient documentation

## 2017-05-19 DIAGNOSIS — Z7984 Long term (current) use of oral hypoglycemic drugs: Secondary | ICD-10-CM

## 2017-05-19 DIAGNOSIS — M109 Gout, unspecified: Secondary | ICD-10-CM

## 2017-05-19 DIAGNOSIS — Z96652 Presence of left artificial knee joint: Secondary | ICD-10-CM | POA: Insufficient documentation

## 2017-05-19 DIAGNOSIS — Z79899 Other long term (current) drug therapy: Secondary | ICD-10-CM | POA: Insufficient documentation

## 2017-05-19 DIAGNOSIS — Z7982 Long term (current) use of aspirin: Secondary | ICD-10-CM

## 2017-05-19 DIAGNOSIS — I129 Hypertensive chronic kidney disease with stage 1 through stage 4 chronic kidney disease, or unspecified chronic kidney disease: Secondary | ICD-10-CM | POA: Insufficient documentation

## 2017-05-19 HISTORY — DX: Gout, unspecified: M10.9

## 2017-05-19 LAB — URIC ACID: Uric Acid: 6.3 mg/dL (ref 3.7–8.6)

## 2017-05-19 MED ORDER — TRAMADOL HCL 50 MG PO TABS: 50.0000 mg | ORAL_TABLET | Freq: Four times a day (QID) | ORAL | 0 refills | Status: DC | PRN

## 2017-05-19 MED ORDER — PREDNISONE 10 MG (21) PO TBPK: ORAL_TABLET | Freq: Every day | ORAL | 0 refills | Status: DC

## 2017-05-19 NOTE — Discharge Instructions (Signed)
You were seen in the emergency department today for a flare of your gout after having difficulty with prescriptions after your office visit yesterday.   Do not take the Allopurinol until you have followed up with a primary care provider.   Take the Prednisone and Tramadol as prescribed.   Call your primary care doctor on Monday to make an appointment within 5 days. If you do not have a primary care doctor our clinic information as well as a family medicine office are included in your discharge instructions.   Return to the emergency department for any new or worsening symptoms including but not limited to fever, chills, worsening pain, numbness, or weakness.

## 2017-05-19 NOTE — ED Notes (Signed)
ED Provider at bedside. 

## 2017-05-19 NOTE — ED Provider Notes (Signed)
[facility]   CSN: [id number] Arrival date & time: 05/19/17  0725     History   Chief Complaint Chief Complaint  Patient presents with  . Gout    HPI Dylan Jordan is a 62 y.o. male with a hx of diabetes and tobacco abuse who presents to the ED complaining of R great toe pain today. Patient states he was seen [facility] and diagnosed with gout- he was given a prescription for colchicine and Allopurinol but states that the pharmacy would not give him the prescription for colchicine due to a resident prescribing it- unclear hx. Patient states constant pain with redness and swelling to R toe, no specific alleviating or aggravating factors. Gout dx 12/04, this is similar. Denies fever, chills, recent surgical procedure, numbness, or weakness.   HPI  Past Medical History:  Diagnosis Date  . Asthma   . Diabetes mellitus without complication (HCC)   . Gout     Patient Active Problem List   Diagnosis Date Noted  . Gout 05/18/2017  . Rash and nonspecific skin eruption 05/04/2017  . Hemorrhoids 05/04/2017  . Haloperidol adverse reaction 04/16/2017  . Diarrhea 02/16/2017  . Community acquired pneumonia of right lower lobe of lung (HCC) 01/19/2017  . Chronic low back pain 01/03/2017  . Chronic cough 06/30/2016  . Drug-induced mood disorder (HCC) 06/14/2016  . Type 2 diabetes mellitus with diabetic neuropathy (HCC) 05/26/2016  . Iron deficiency anemia 05/26/2016  . Chronic kidney disease 05/26/2016  . HTN (hypertension) 05/26/2016  . Tobacco abuse 05/26/2016  . History of substance abuse 05/26/2016  . History of alcohol abuse 05/26/2016  . Asthma 05/26/2016    Past Surgical History:  Procedure Laterality Date  . REPLACEMENT TOTAL KNEE Left   . TOE SURGERY         Home Medications    Prior to Admission medications   Medication Sig Start Date End Date Taking? Authorizing Provider  acetaminophen  (TYLENOL) 650 MG CR tablet Take 650 mg by mouth 2 (two) times daily as needed for pain.     [provider]  allopurinol (ZYLOPRIM) 100 MG tablet Take 1 tablet (100 mg total) by mouth daily. 05/18/17   Diallo, Lilia ArgueAbdoulaye, MD  amLODipine (NORVASC) 5 MG tablet Take 1 tablet (5 mg total) by mouth daily. 11/06/16   [name], [name], DO  ammonium lactate (AMLACTIN) 12 % cream Apply topically as needed for dry skin. Patient taking differently: Apply 1 g topically as needed for dry skin.  07/26/16   Felecia ShellingEvans, Brent M, DPM  aspirin EC 81 MG tablet Take 1 tablet (81 mg total) by mouth daily. 08/04/16   [name], [name], DO  atorvastatin (LIPITOR) 20 MG tablet take 1 TABLET BY MOUTH EVERY DAY 01/22/17   Dolores Pattyiccio, [name], DO  benztropine (COGENTIN) 2 MG tablet Take 2 mg by mouth 2 (two) times daily.    [provider]  cephALEXin (KEFLEX) 500 MG capsule Take 1 capsule (500 mg total) by mouth 2 (two) times daily. 04/17/17   Kirichenko, Lemont Fillersatyana, PA-C  cetirizine (ZYRTEC) 10 MG tablet Take 1 tablet (10 mg total) by mouth daily. 01/10/17   [name], [name], DO  cholecalciferol (VITAMIN D) 1000 units tablet Take 1,000 Units by mouth daily.    [provider]  Cyanocobalamin (VITAMIN B-12) 6000 MCG SUBL Place 6,000 mcg under the tongue daily.    [provider]  diclofenac (VOLTAREN) 75 MG EC tablet  Take 1 tablet (75 mg total) by mouth 2 (two) times daily. 09/26/16   Tarry Kos, MD  diclofenac sodium (VOLTAREN) 1 % GEL Apply 4 g topically 4 (four) times daily. 01/01/17   Mayo, Allyn Kenner, MD  divalproex (DEPAKOTE) 500 MG DR tablet Take 500-2,000 mg by mouth See admin instructions. Take 500mg  every morning and 2000 tabs every evening    [provider]  empagliflozin (JARDIANCE) 10 MG TABS tablet Take 10 mg by mouth daily. 05/04/17   Tillman Sers, DO  ferrous sulfate (FERROUSUL) 325 (65 FE) MG tablet Take 1 tablet (325 mg total) by mouth daily with breakfast. 01/10/17   Tillman Sers, DO  fluticasone (FLONASE) 50 MCG/ACT nasal spray Place 2 sprays into both nostrils daily. Patient taking differently: Place 2 sprays into both nostrils daily as needed for allergies.  12/05/16   Tillman Sers, DO  Fluticasone-Salmeterol (ADVAIR DISKUS) 250-50 MCG/DOSE AEPB Inhale 1 puff into the lungs 2 (two) times daily. 07/19/16   Tillman Sers, DO  gabapentin (NEURONTIN) 300 MG capsule Take 1 capsule (300 mg total) by mouth 3 (three) times daily as needed. 02/19/17   Tillman Sers, DO  glimepiride (AMARYL) 4 MG tablet Take 1 tablet (4 mg total) by mouth 2 (two) times daily. 12/01/16   Almon Hercules, MD  haloperidol (HALDOL) 10 MG tablet Take 10 mg by mouth every evening.     [provider]  haloperidol decanoate (HALDOL DECANOATE) 100 MG/ML injection Inject 400 mg into the muscle every 28 (twenty-eight) days.    [provider]  HYDROcodone-acetaminophen (NORCO) 5-325 MG tablet Take 1 tablet by mouth every 6 (six) hours as needed for moderate pain. 04/17/17   Kirichenko, Tatyana, PA-C  ibuprofen (ADVIL,MOTRIN) 200 MG tablet Take 200 mg by mouth every 6 (six) hours as needed for mild pain.     [provider]  lithium carbonate 300 MG capsule Take 300 mg by mouth 2 (two) times daily with a meal.    [provider]  losartan (COZAAR) 25 MG tablet TAKE 1 TABLET BY MOUTH EVERY DAY Patient taking differently: TAKE 25mg  BY MOUTH EVERY DAY 10/03/16   Garth Bigness, MD  metFORMIN (GLUCOPHAGE) 500 MG tablet Take 2 TABLET TWICE DAILY with meals 03/20/17   [name], [name], DO  montelukast (SINGULAIR) 10 MG tablet take 1 TABLET BY MOUTH EVERY DAY 01/22/17   [name] C, DO  Multiple Vitamin (DAILY-VITE) TABS TAKE 1 TABLET BY MOUTH DAILY. 10/03/16   Garth Bigness, MD  nicotine (NICODERM CQ - DOSED IN MG/24 HOURS) 21 mg/24hr patch Place 1 patch (21 mg total) onto the skin daily. 02/16/17   Tillman Sers, DO  omeprazole (PRILOSEC) 20 MG capsule  Take 2 capsules (40 mg total) by mouth daily. 04/26/17   Tillman Sers, DO  phenylephrine-shark liver oil-mineral oil-petrolatum (PREPARATION H) 0.25-14-74.9 % rectal ointment Place 1 application rectally 2 (two) times daily as needed for hemorrhoids. 05/04/17   Tillman Sers, DO  predniSONE (STERAPRED UNI-PAK 21 TAB) 10 MG (21) TBPK tablet Take by mouth daily. Take 6 tabs by mouth daily  for 2 days, then 5 tabs for 2 days, then 4 tabs for 2 days, then 3 tabs for 2 days, 2 tabs for 2 days, then 1 tab by mouth daily for 2 days 05/19/17   Royelle Hinchman R, PA-C  traMADol (ULTRAM) 50 MG tablet Take 1 tablet (50 mg total) by mouth every 6 (six)  hours as needed. 05/19/17   Cassidie Veiga, Pleas Koch, PA-C    Family History Family History  Problem Relation Age of Onset  . Allergic rhinitis Neg Hx   . Angioedema Neg Hx   . Asthma Neg Hx   . Atopy Neg Hx   . Eczema Neg Hx   . Immunodeficiency Neg Hx   . Urticaria Neg Hx     Social History Social History   Tobacco Use  . Smoking status: Current Every Day Smoker    Packs/day: 1.00    Years: 45.00    Pack years: 45.00    Types: Cigarettes    Start date: 05/16/1971  . Smokeless tobacco: Never Used  . Tobacco comment: Now smokes about 3 cigarettes a week  Substance Use Topics  . Alcohol use: No  . Drug use: No     Allergies   Ativan [lorazepam] and Penicillins   Review of Systems Review of Systems  Constitutional: Negative for chills and fever.  Gastrointestinal: Negative for nausea and vomiting.  Musculoskeletal: Positive for arthralgias (R 1st toe with associated swelling and redness).  Neurological: Negative for weakness and numbness.     Physical Exam Updated Vital Signs BP 107/67 (BP Location: Right Arm)   Pulse 89   Temp 98.2 F (36.8 C) (Oral)   Resp 20   SpO2 100%   Physical Exam  Constitutional: He appears well-developed and well-nourished. No distress.  HENT:  Head: Normocephalic and atraumatic.  Eyes:  Conjunctivae are normal. Right eye exhibits no discharge. Left eye exhibits no discharge.  Cardiovascular:  Pulses:      Dorsalis pedis pulses are 2+ on the right side, and 2+ on the left side.  Musculoskeletal:  Lower extremities: No obvious deformity. There is swelling and redness over the 1st MTP that is tender to palpation. Patient is able to move the digit- somewhat limited ROM secondary to pain. No other bony tenderness. No overlying warmth. No fluctuance or active drainage.   Neurological: He is alert.  Clear speech.   Psychiatric: He has a normal mood and affect. His behavior is normal. Thought content normal.  Nursing note and vitals reviewed.   ED Treatments / Results  Labs (all labs ordered are listed, but only abnormal results are displayed) Labs Reviewed - No data to display  EKG  EKG Interpretation None      Radiology No results found.  Procedures Procedures (including critical care time)  Medications Ordered in ED Medications - No data to display  Initial Impression / Assessment and Plan / ED Course  I have reviewed the triage vital signs and the nursing notes.  Pertinent labs & imaging results that were available during my care of the patient were reviewed by me and considered in my medical decision making (see chart for details).    Patient presents with R 1st MTP pain, recent diagnosis of gout with difficulty obtaining therapeutic medications. Patient is nontoxic appearing, vitals WNL. Patient presents with monoarticular pain, swelling, and erythema- consistent with gout on exam.  Patient is afebrile, no recent surgical procedure, doubt septic joint. Given elevated creatinine previously will avoid NSAIDs, additionally due to difficulty with Colchicine will avoid this as well. Will treat with Steroid taper and with short course of Tramadol. [place] Controlled Substance reporting System queried. Instructed patient to hold Allopurinol until PCP follow up to  discuss this for prevention. I discussed, treatment plan, need for PCP follow-up for re-eval and possible preventative therapy, and return precautions with the  patient. Provided opportunity for questions, patient confirmed understanding and is in agreement with plan.    Final Clinical Impressions(s) / ED Diagnoses   Final diagnoses:  Acute gout involving toe of right foot, unspecified cause    ED Discharge Orders        Ordered    traMADol (ULTRAM) 50 MG tablet  Every 6 hours PRN     05/19/17 1012    predniSONE (STERAPRED UNI-PAK 21 TAB) 10 MG (21) TBPK tablet  Daily     05/19/17 1012       Mohamud Mrozek R, PA-C 05/19/17 1147  Of note patient's blood glucose at home have been in the 140s.    Cherly Anderson, PA-C 05/19/17 Merrily Brittle    Arby Barrette, MD 05/20/17 1141

## 2017-05-19 NOTE — ED Triage Notes (Signed)
Pt reports right foot pain that is related to gout, hx of same. No acute distress is noted at triage.

## 2017-05-20 ENCOUNTER — Inpatient Hospital Stay (HOSPITAL_COMMUNITY)
Admission: EM | Admit: 2017-05-20 | Discharge: 2017-05-23 | DRG: 871 | Disposition: A | Discharge status: Home / Self Care | Payer: Medicaid Other | Attending: Internal Medicine | Admitting: Internal Medicine

## 2017-05-20 ENCOUNTER — Encounter (HOSPITAL_COMMUNITY): Payer: Self-pay | Admitting: Nurse Practitioner

## 2017-05-20 ENCOUNTER — Emergency Department (HOSPITAL_COMMUNITY): Payer: Medicaid Other

## 2017-05-20 ENCOUNTER — Inpatient Hospital Stay (HOSPITAL_COMMUNITY): Payer: Medicaid Other

## 2017-05-20 ENCOUNTER — Other Ambulatory Visit: Payer: Self-pay

## 2017-05-20 DIAGNOSIS — Z7984 Long term (current) use of oral hypoglycemic drugs: Secondary | ICD-10-CM | POA: Diagnosis not present

## 2017-05-20 DIAGNOSIS — R197 Diarrhea, unspecified: Secondary | ICD-10-CM | POA: Diagnosis present

## 2017-05-20 DIAGNOSIS — M1A9XX0 Chronic gout, unspecified, without tophus (tophi): Secondary | ICD-10-CM | POA: Diagnosis present

## 2017-05-20 DIAGNOSIS — F259 Schizoaffective disorder, unspecified: Secondary | ICD-10-CM | POA: Diagnosis present

## 2017-05-20 DIAGNOSIS — Z7982 Long term (current) use of aspirin: Secondary | ICD-10-CM | POA: Diagnosis not present

## 2017-05-20 DIAGNOSIS — Z79899 Other long term (current) drug therapy: Secondary | ICD-10-CM

## 2017-05-20 DIAGNOSIS — Z96652 Presence of left artificial knee joint: Secondary | ICD-10-CM | POA: Diagnosis present

## 2017-05-20 DIAGNOSIS — A0472 Enterocolitis due to Clostridium difficile, not specified as recurrent: Secondary | ICD-10-CM | POA: Diagnosis present

## 2017-05-20 DIAGNOSIS — Z8701 Personal history of pneumonia (recurrent): Secondary | ICD-10-CM

## 2017-05-20 DIAGNOSIS — A419 Sepsis, unspecified organism: Secondary | ICD-10-CM | POA: Diagnosis not present

## 2017-05-20 DIAGNOSIS — N189 Chronic kidney disease, unspecified: Secondary | ICD-10-CM | POA: Diagnosis present

## 2017-05-20 DIAGNOSIS — F17210 Nicotine dependence, cigarettes, uncomplicated: Secondary | ICD-10-CM | POA: Diagnosis present

## 2017-05-20 DIAGNOSIS — I5030 Unspecified diastolic (congestive) heart failure: Secondary | ICD-10-CM | POA: Diagnosis not present

## 2017-05-20 DIAGNOSIS — E1140 Type 2 diabetes mellitus with diabetic neuropathy, unspecified: Secondary | ICD-10-CM | POA: Diagnosis present

## 2017-05-20 DIAGNOSIS — A414 Sepsis due to anaerobes: Principal | ICD-10-CM | POA: Diagnosis present

## 2017-05-20 DIAGNOSIS — I129 Hypertensive chronic kidney disease with stage 1 through stage 4 chronic kidney disease, or unspecified chronic kidney disease: Secondary | ICD-10-CM | POA: Diagnosis present

## 2017-05-20 DIAGNOSIS — K219 Gastro-esophageal reflux disease without esophagitis: Secondary | ICD-10-CM | POA: Diagnosis present

## 2017-05-20 DIAGNOSIS — E785 Hyperlipidemia, unspecified: Secondary | ICD-10-CM | POA: Diagnosis present

## 2017-05-20 DIAGNOSIS — F319 Bipolar disorder, unspecified: Secondary | ICD-10-CM | POA: Diagnosis present

## 2017-05-20 DIAGNOSIS — G9341 Metabolic encephalopathy: Secondary | ICD-10-CM | POA: Diagnosis present

## 2017-05-20 DIAGNOSIS — J449 Chronic obstructive pulmonary disease, unspecified: Secondary | ICD-10-CM | POA: Diagnosis present

## 2017-05-20 DIAGNOSIS — E1122 Type 2 diabetes mellitus with diabetic chronic kidney disease: Secondary | ICD-10-CM | POA: Diagnosis present

## 2017-05-20 DIAGNOSIS — E872 Acidosis: Secondary | ICD-10-CM | POA: Diagnosis present

## 2017-05-20 DIAGNOSIS — M109 Gout, unspecified: Secondary | ICD-10-CM | POA: Diagnosis present

## 2017-05-20 DIAGNOSIS — R509 Fever, unspecified: Secondary | ICD-10-CM | POA: Diagnosis present

## 2017-05-20 HISTORY — DX: Hyperlipidemia, unspecified: E78.5

## 2017-05-20 HISTORY — DX: Anemia, unspecified: D64.9

## 2017-05-20 HISTORY — DX: Essential (primary) hypertension: I10

## 2017-05-20 LAB — CBC WITH DIFFERENTIAL/PLATELET
Basophils Absolute: 0 10*3/uL (ref 0.0–0.1)
Basophils Relative: 0 %
Eosinophils Absolute: 0.1 10*3/uL (ref 0.0–0.7)
Eosinophils Relative: 1 %
HEMATOCRIT: 39.6 % (ref 39.0–52.0)
Hemoglobin: 12.7 g/dL — ABNORMAL LOW (ref 13.0–17.0)
LYMPHS PCT: 9 %
Lymphs Abs: 0.8 10*3/uL (ref 0.7–4.0)
MCH: 28.9 pg (ref 26.0–34.0)
MCHC: 32.1 g/dL (ref 30.0–36.0)
MCV: 90 fL (ref 78.0–100.0)
MONO ABS: 0.7 10*3/uL (ref 0.1–1.0)
MONOS PCT: 8 %
NEUTROS ABS: 7.6 10*3/uL (ref 1.7–7.7)
Neutrophils Relative %: 82 %
Platelets: 255 10*3/uL (ref 150–400)
RBC: 4.4 MIL/uL (ref 4.22–5.81)
RDW: 14.2 % (ref 11.5–15.5)
WBC: 9.2 10*3/uL (ref 4.0–10.5)

## 2017-05-20 LAB — COMPREHENSIVE METABOLIC PANEL
ALBUMIN: 3.7 g/dL (ref 3.5–5.0)
ALK PHOS: 49 U/L (ref 38–126)
ALT: 12 U/L — ABNORMAL LOW (ref 17–63)
ANION GAP: 7 (ref 5–15)
AST: 16 U/L (ref 15–41)
BUN: 12 mg/dL (ref 6–20)
CALCIUM: 9.6 mg/dL (ref 8.9–10.3)
CO2: 18 mmol/L — AB (ref 22–32)
Chloride: 114 mmol/L — ABNORMAL HIGH (ref 101–111)
Creatinine, Ser: 1.26 mg/dL — ABNORMAL HIGH (ref 0.61–1.24)
GFR calc Af Amer: >60 mL/min (ref >60)
GFR calc non Af Amer: >60 mL/min (ref >60)
GLUCOSE: 185 mg/dL — AB (ref 65–99)
Potassium: 3.6 mmol/L (ref 3.5–5.1)
SODIUM: 139 mmol/L (ref 135–145)
Total Bilirubin: 0.6 mg/dL (ref 0.3–1.2)
Total Protein: 7.7 g/dL (ref 6.5–8.1)

## 2017-05-20 LAB — URINALYSIS, ROUTINE W REFLEX MICROSCOPIC
Bilirubin Urine: NEGATIVE
Glucose, UA: 150 mg/dL — AB
Hgb urine dipstick: NEGATIVE
Ketones, ur: NEGATIVE mg/dL
Leukocytes, UA: NEGATIVE
Nitrite: NEGATIVE
Protein, ur: 30 mg/dL — AB
SPECIFIC GRAVITY, URINE: 1.006 (ref 1.005–1.030)
Squamous Epithelial / LPF: NONE SEEN
pH: 5 (ref 5.0–8.0)

## 2017-05-20 LAB — LITHIUM LEVEL: LITHIUM LVL: 0.38 mmol/L — AB (ref 0.60–1.20)

## 2017-05-20 LAB — SEDIMENTATION RATE: Sed Rate: 9 mm/hr (ref 0–16)

## 2017-05-20 LAB — I-STAT CG4 LACTIC ACID, ED
LACTIC ACID, VENOUS: 1.2 mmol/L (ref 0.5–1.9)
Lactic Acid, Venous: 2.58 mmol/L (ref 0.5–1.9)

## 2017-05-20 LAB — INFLUENZA PANEL BY PCR (TYPE A & B)
INFLAPCR: NEGATIVE
INFLBPCR: NEGATIVE

## 2017-05-20 LAB — TSH: TSH: 1.181 u[IU]/mL (ref 0.350–4.500)

## 2017-05-20 LAB — TROPONIN I

## 2017-05-20 LAB — VALPROIC ACID LEVEL: Valproic Acid Lvl: 37 ug/mL — ABNORMAL LOW (ref 50.0–100.0)

## 2017-05-20 LAB — D-DIMER, QUANTITATIVE (NOT AT ARMC): D DIMER QUANT: 0.64 ug{FEU}/mL — AB (ref 0.00–0.50)

## 2017-05-20 LAB — CBG MONITORING, ED: GLUCOSE-CAPILLARY: 185 mg/dL — AB (ref 65–99)

## 2017-05-20 MED ORDER — SODIUM CHLORIDE 0.9 % IV BOLUS (SEPSIS)
1000.0000 mL | Freq: Once | INTRAVENOUS | Status: AC
  Administered 2017-05-20: 1000 mL via INTRAVENOUS

## 2017-05-20 MED ORDER — SODIUM CHLORIDE 0.9 % IV BOLUS (SEPSIS)
500.0000 mL | Freq: Once | INTRAVENOUS | Status: AC
  Administered 2017-05-20: 500 mL via INTRAVENOUS

## 2017-05-20 MED ORDER — DEXTROSE 5 % IV SOLN
2.0000 g | Freq: Once | INTRAVENOUS | Status: AC
  Administered 2017-05-20: 2 g via INTRAVENOUS
  Filled 2017-05-20: qty 2

## 2017-05-20 MED ORDER — LEVOFLOXACIN IN D5W 750 MG/150ML IV SOLN
750.0000 mg | Freq: Every day | INTRAVENOUS | Status: DC
  Administered 2017-05-20 – 2017-05-22 (×3): 750 mg via INTRAVENOUS
  Filled 2017-05-20 (×3): qty 150

## 2017-05-20 MED ORDER — SODIUM CHLORIDE 0.9 % IV BOLUS (SEPSIS): 1000.0000 mL | Freq: Once | INTRAVENOUS | Status: DC

## 2017-05-20 MED ORDER — ACETAMINOPHEN 500 MG PO TABS
1000.0000 mg | ORAL_TABLET | Freq: Once | ORAL | Status: AC
Start: 2017-05-20 — End: 2017-05-20
  Administered 2017-05-20: 1000 mg via ORAL
  Filled 2017-05-20: qty 2

## 2017-05-20 MED ORDER — VANCOMYCIN HCL IN DEXTROSE 1-5 GM/200ML-% IV SOLN
1000.0000 mg | Freq: Once | INTRAVENOUS | Status: AC
  Administered 2017-05-20: 1000 mg via INTRAVENOUS
  Filled 2017-05-20: qty 200

## 2017-05-20 NOTE — ED Triage Notes (Signed)
Pt states he feels dizzy and lightheaded. Adds that he has not been able to take any of his meds.

## 2017-05-20 NOTE — Progress Notes (Signed)
A consult was received from an ED physician for Rocephin per pharmacy dosing.  The patient's profile has been reviewed for ht/wt/allergies/indication/available labs. A one time order has been placed for the above antibiotics.  Further antibiotics/pharmacy consults should be ordered by admitting physician if indicated.                       Thank you, Bernadene Personrew Iolani Twilley, PharmD, BCPS Pager: [phone] 05/20/2017, 3:50 PM

## 2017-05-20 NOTE — ED Provider Notes (Signed)
[facility]-EMERGENCY DEPT Provider Note   CSN: [id number] Arrival date & time: 05/20/17  1455     History   Chief Complaint Chief Complaint  Patient presents with  . Dizziness    HPI Dylan Jordan is a 62 y.o. male w PMHx T2DM, CKD, HTN, substance abuse, presenting to ED with acute onset of generalized weakness and lightheadedness. States he was feeling fine yesterday however woke up today around 1pm and felt ill. States he took his blood sugar and it was reportedly 20. States he ate some food and rechecked it showing 123. Reports he feels as though he needs to urinate but is unable to. Only other complaint in the ED is he states he is thirsty. Denies HA, CP, SOB, cough, abdominal pain, nausea, dysuria. Of note, pt seen on 05/18/17 by PCP and yesterday in ED for gout of right 1st toe. States his symptoms have improved since that time and pain is minimal now. Reports he took 2 tramadol today and a prednisone tablet. Per chart review, pt prescribed 50mg  tramadol q6hrs and prednisone taper yesterday.   The history is provided by the patient.    Past Medical History:  Diagnosis Date  . Asthma   . Diabetes mellitus without complication (HCC)   . Gout     Patient Active Problem List   Diagnosis Date Noted  . Gout 05/18/2017  . Rash and nonspecific skin eruption 05/04/2017  . Hemorrhoids 05/04/2017  . Haloperidol adverse reaction 04/16/2017  . Diarrhea 02/16/2017  . Community acquired pneumonia of right lower lobe of lung (HCC) 01/19/2017  . Chronic low back pain 01/03/2017  . Chronic cough 06/30/2016  . Drug-induced mood disorder (HCC) 06/14/2016  . Type 2 diabetes mellitus with diabetic neuropathy (HCC) 05/26/2016  . Iron deficiency anemia 05/26/2016  . Chronic kidney disease 05/26/2016  . HTN (hypertension) 05/26/2016  . Tobacco abuse 05/26/2016  . History of substance abuse 05/26/2016  . History of alcohol abuse 05/26/2016  . Asthma 05/26/2016    Past  Surgical History:  Procedure Laterality Date  . REPLACEMENT TOTAL KNEE Left   . TOE SURGERY         Home Medications    Prior to Admission medications   Medication Sig Start Date End Date Taking? Authorizing Provider  acetaminophen (TYLENOL) 650 MG CR tablet Take 650 mg by mouth 2 (two) times daily as needed for pain.    Yes [provider]  allopurinol (ZYLOPRIM) 100 MG tablet Take 1 tablet (100 mg total) by mouth daily. 05/18/17  Yes Diallo, Abdoulaye, MD  amLODipine (NORVASC) 5 MG tablet Take 1 tablet (5 mg total) by mouth daily. 11/06/16  Yes Tillman Sers, DO  aspirin EC 81 MG tablet Take 1 tablet (81 mg total) by mouth daily. 08/04/16  Yes [name], [name], DO  atorvastatin (LIPITOR) 20 MG tablet take 1 TABLET BY MOUTH EVERY DAY 01/22/17  Yes [name], [name], DO  benztropine (COGENTIN) 2 MG tablet Take 2 mg by mouth 2 (two) times daily.   Yes [provider]  cholecalciferol (VITAMIN D) 1000 units tablet Take 1,000 Units by mouth daily.   Yes [provider]  Cyanocobalamin (VITAMIN B-12) 6000 MCG SUBL Place 6,000 mcg under the tongue daily.   Yes [provider]  divalproex (DEPAKOTE) 500 MG DR tablet Take 500-2,000 mg by mouth See admin instructions. Take 500mg  every morning and 2000 tabs every evening   Yes [provider]  empagliflozin (JARDIANCE) 10 MG  TABS tablet Take 10 mg by mouth daily. 05/04/17  Yes Dolores Pattyiccio, [name], DO  ferrous sulfate (FERROUSUL) 325 (65 FE) MG tablet Take 1 tablet (325 mg total) by mouth daily with breakfast. 01/10/17  Yes [name], [name], DO  gabapentin (NEURONTIN) 300 MG capsule Take 1 capsule (300 mg total) by mouth 3 (three) times daily as needed. 02/19/17  Yes [name], [name], DO  glimepiride (AMARYL) 4 MG tablet Take 1 tablet (4 mg total) by mouth 2 (two) times daily. 12/01/16  Yes Almon HerculesGonfa, Taye T, MD  haloperidol (HALDOL) 10 MG tablet Take 5 mg by mouth every evening.    Yes [provider]    haloperidol decanoate (HALDOL DECANOATE) 100 MG/ML injection Inject 400 mg into the muscle every 28 (twenty-eight) days.   Yes [provider]  ibuprofen (ADVIL,MOTRIN) 200 MG tablet Take 200 mg by mouth every 6 (six) hours as needed for mild pain.    Yes [provider]  indomethacin (INDOCIN) 50 MG capsule Take 50 mg by mouth 2 (two) times daily with a meal. 04/18/17  Yes [provider]  lithium carbonate 300 MG capsule Take 300 mg by mouth 2 (two) times daily with a meal.   Yes [provider]  losartan (COZAAR) 25 MG tablet TAKE 1 TABLET BY MOUTH EVERY DAY Patient taking differently: TAKE 25mg  BY MOUTH EVERY DAY 10/03/16  Yes Garth Bignessimberlake, Kathryn, MD  metFORMIN (GLUCOPHAGE) 500 MG tablet Take 2 TABLET TWICE DAILY with meals 03/20/17  Yes [name], [name], DO  montelukast (SINGULAIR) 10 MG tablet take 1 TABLET BY MOUTH EVERY DAY Patient taking differently: take 1 TABLET BY MOUTH EVERY EVENING 01/22/17  Yes [name], [name], DO  Multiple Vitamin (DAILY-VITE) TABS TAKE 1 TABLET BY MOUTH DAILY. 10/03/16  Yes Garth Bignessimberlake, Kathryn, MD  nicotine (NICODERM CQ - DOSED IN MG/24 HOURS) 21 mg/24hr patch Place 1 patch (21 mg total) onto the skin daily. 02/16/17  Yes [name], Marcell AngerAngela C, DO  omeprazole (PRILOSEC) 20 MG capsule Take 2 capsules (40 mg total) by mouth daily. Patient taking differently: Take 40 mg by mouth every evening.  04/26/17  Yes [name], [name], DO  predniSONE (STERAPRED UNI-PAK 21 TAB) 10 MG (21) TBPK tablet Take by mouth daily. Take 6 tabs by mouth daily  for 2 days, then 5 tabs for 2 days, then 4 tabs for 2 days, then 3 tabs for 2 days, 2 tabs for 2 days, then 1 tab by mouth daily for 2 days 05/19/17  Yes Petrucelli, Samantha R, PA-C  traMADol (ULTRAM) 50 MG tablet Take 1 tablet (50 mg total) by mouth every 6 (six) hours as needed. 05/19/17  Yes Petrucelli, Samantha R, PA-C  ammonium lactate (AMLACTIN) 12 % cream Apply topically as needed for dry skin. Patient  taking differently: Apply 1 g topically as needed for dry skin.  07/26/16   Felecia ShellingEvans, Brent M, DPM  cephALEXin (KEFLEX) 500 MG capsule Take 1 capsule (500 mg total) by mouth 2 (two) times daily. Patient not taking: Reported on 05/20/2017 04/17/17   Jaynie CrumbleKirichenko, Tatyana, PA-C  cetirizine (ZYRTEC) 10 MG tablet Take 1 tablet (10 mg total) by mouth daily. 01/10/17   [name], [name], DO  diclofenac (VOLTAREN) 75 MG EC tablet Take 1 tablet (75 mg total) by mouth 2 (two) times daily. Patient not taking: Reported on 05/20/2017 09/26/16   Tarry KosXu, Naiping M, MD  diclofenac sodium (VOLTAREN) 1 % GEL Apply 4 g topically 4 (four) times daily. 01/01/17  Mayo, Allyn Kenner, MD  fluticasone [facility]) 50 MCG/ACT nasal spray Place 2 sprays into both nostrils daily. Patient taking differently: Place 2 sprays into both nostrils daily as needed for allergies.  12/05/16   Tillman Sers, DO  Fluticasone-Salmeterol (ADVAIR DISKUS) 250-50 MCG/DOSE AEPB Inhale 1 puff into the lungs 2 (two) times daily. Patient not taking: Reported on 05/20/2017 07/19/16   Tillman Sers, DO  HYDROcodone-acetaminophen (NORCO) 5-325 MG tablet Take 1 tablet by mouth every 6 (six) hours as needed for moderate pain. Patient not taking: Reported on 05/20/2017 04/17/17   Jaynie Crumble, PA-C  phenylephrine-shark liver oil-mineral oil-petrolatum (PREPARATION H) 0.25-14-74.9 % rectal ointment Place 1 application rectally 2 (two) times daily as needed for hemorrhoids. Patient not taking: Reported on 05/20/2017 05/04/17   Tillman Sers, DO    Family History Family History  Problem Relation Age of Onset  . Allergic rhinitis Neg Hx   . Angioedema Neg Hx   . Asthma Neg Hx   . Atopy Neg Hx   . Eczema Neg Hx   . Immunodeficiency Neg Hx   . Urticaria Neg Hx     Social History Social History   Tobacco Use  . Smoking status: Current Every Day Smoker    Packs/day: 1.00    Years: 45.00    Pack years: 45.00    Types: Cigarettes    Start date: 05/16/1971  .  Smokeless tobacco: Never Used  . Tobacco comment: Now smokes about 3 cigarettes a week  Substance Use Topics  . Alcohol use: No  . Drug use: No     Allergies   Ativan [lorazepam] and Penicillins   Review of Systems Review of Systems  Constitutional: Positive for fever.       Malaise  HENT: Negative for congestion.   Respiratory: Negative for cough and shortness of breath.   Cardiovascular: Negative for chest pain.  Gastrointestinal: Negative for abdominal pain and nausea.  Genitourinary: Positive for difficulty urinating. Negative for dysuria.  Neurological: Negative for headaches.  All other systems reviewed and are negative.    Physical Exam Updated Vital Signs BP (!) 140/103   Pulse 93   Temp 100.3 F (37.9 C) (Oral)   Resp 16   Ht 6\' 2"  (1.88 m)   Wt 101.6 kg (224 lb)   SpO2 98%   BMI 28.76 kg/m   Physical Exam  Constitutional: He is oriented to person, place, and time. He appears well-developed and well-nourished.  Mildly diaphoretic, flushed cheeks. Pt sleepy but easily arousable.  HENT:  Head: Normocephalic and atraumatic.  Mouth/Throat: Oropharynx is clear and moist.  Eyes: Conjunctivae and EOM are normal. Pupils are equal, round, and reactive to light.  Neck: Normal range of motion. Neck supple.  Cardiovascular: Regular rhythm, normal heart sounds and intact distal pulses.  tachycardic  Pulmonary/Chest: Effort normal and breath sounds normal. No stridor. No respiratory distress. He has no wheezes. He has no rales.  Abdominal: Soft. Bowel sounds are normal. He exhibits no distension. There is no tenderness. There is no rebound and no guarding.  Musculoskeletal: Normal range of motion.  Right 1st MTP without erythema, edema or warmth. Mild tenderness. Normal ROM. No evidence of septic arthritis.  No LE edema or tenderness.   Lymphadenopathy:    He has no cervical adenopathy.  Neurological: He is alert and oriented to person, place, and time.  Skin: Skin  is warm.  Psychiatric: He has a normal mood and affect. His behavior is normal.  Nursing note and vitals reviewed.    ED Treatments / Results  Labs (all labs ordered are listed, but only abnormal results are displayed) Labs Reviewed  COMPREHENSIVE METABOLIC PANEL - Abnormal; Notable for the following components:      Result Value   Chloride 114 (*)    CO2 18 (*)    Glucose, Bld 185 (*)    Creatinine, Ser 1.26 (*)    ALT 12 (*)    All other components within normal limits  CBC WITH DIFFERENTIAL/PLATELET - Abnormal; Notable for the following components:   Hemoglobin 12.7 (*)    All other components within normal limits  URINALYSIS, ROUTINE W REFLEX MICROSCOPIC - Abnormal; Notable for the following components:   Glucose, UA 150 (*)    Protein, ur 30 (*)    Bacteria, UA RARE (*)    All other components within normal limits  I-STAT CG4 LACTIC ACID, ED - Abnormal; Notable for the following components:   Lactic Acid, Venous 2.58 (*)    All other components within normal limits  CBG MONITORING, ED - Abnormal; Notable for the following components:   Glucose-Capillary 185 (*)    All other components within normal limits  CULTURE, BLOOD (ROUTINE X 2)  CULTURE, BLOOD (ROUTINE X 2)  URINE CULTURE  INFLUENZA PANEL BY PCR (TYPE A & B)  I-STAT TROPONIN, ED  I-STAT CG4 LACTIC ACID, ED    EKG  EKG Interpretation  Date/Time:  Sunday May 20 2017 15:17:27 EST Ventricular Rate:  133 PR Interval:    QRS Duration: 117 QT Interval:  395 QTC Calculation: 588 R Axis:   -17 Text Interpretation:  Sinus tachycardia Nonspecific intraventricular conduction delay Low voltage, extremity and precordial leads Baseline wander in lead(s) V3 No previous ECGs available There is ST depressions that may be rate related  Confirmed by Richardean Canal [phone]) on 05/20/2017 3:31:36 PM       Radiology Dg Chest 2 View  Result Date: 05/20/2017 CLINICAL DATA:  Fever and dizziness. EXAM: CHEST  2 VIEW  COMPARISON:  02/09/2017 and prior radiographs FINDINGS: The cardiomediastinal silhouette is unremarkable. Peribronchial thickening is unchanged. Unchanged appearance of the lungs from prior radiographs. There is no evidence of focal airspace disease, pulmonary edema, suspicious pulmonary nodule/mass, pleural effusion, or pneumothorax. No acute bony abnormalities are identified. IMPRESSION: No evidence of acute cardiopulmonary disease. Electronically Signed   By: Harmon Pier M.D.   On: 05/20/2017 16:02    Procedures Procedures (including critical care time)  CRITICAL CARE Performed by: [place] N Jesi Jurgens   Total critical care time: 45 minutes  Critical care time was exclusive of separately billable procedures and treating other patients.  Critical care was necessary to treat or prevent imminent or life-threatening deterioration.  Critical care was time spent personally by me on the following activities: development of treatment plan with patient and/or surrogate as well as nursing, discussions with consultants, evaluation of patient's response to treatment, examination of patient, obtaining history from patient or surrogate, ordering and performing treatments and interventions, ordering and review of laboratory studies, ordering and review of radiographic studies, pulse oximetry and re-evaluation of patient's condition.  Medications Ordered in ED Medications  acetaminophen (TYLENOL) tablet 1,000 mg (1,000 mg Oral Given 05/20/17 1614)  sodium chloride 0.9 % bolus 1,000 mL (0 mLs Intravenous Stopped 05/20/17 1734)    And  sodium chloride 0.9 % bolus 1,000 mL (0 mLs Intravenous Stopped 05/20/17 1733)    And  sodium chloride 0.9 % bolus 1,000  mL (0 mLs Intravenous Stopped 05/20/17 1733)    And  sodium chloride 0.9 % bolus 500 mL (0 mLs Intravenous Stopped 05/20/17 1733)  cefTRIAXone (ROCEPHIN) 2 g in dextrose 5 % 50 mL IVPB (0 g Intravenous Stopped 05/20/17 1734)     Initial Impression / Assessment and  Plan / ED Course  I have reviewed the triage vital signs and the nursing notes.  Pertinent labs & imaging results that were available during my care of the patient were reviewed by me and considered in my medical decision making (see chart for details).  Clinical Course as of May 21 2155  Wynelle Link May 20, 2017  1711 Per tech, bladder scan w 140cc  [JR]  1949 Dr. Selena Batten with [facility] accepting admission.  [JR]    Clinical Course User Index [JR] Makinsley Schiavi, [place] N, PA-C    Pt presenting to ED with acute onset of malaise. Pt febrile, tachycardic and hypotensive on arrival. Initial EKG with nonspecific ischemic changes, may be rate-related. Code sepsis initiated. No other complaints other than difficulty urinating and thirst. On exam, pt mildly diaphoretic, flushed. Lungs CTAB, abd benign. Tachycardic with normal heart sounds. Previously diagnosed gout with improvement since image documented on 05/18/17 by PCP. No erythema or warmth noted to joint; pt reports improvement in sx; doubt low likelihood of septic arthritis. CXR ordered, labs, CBG, blood cultures, U/A, IVF at 30mg /kg, tylenol, rocephin.  CXR neg for PNA. U/A neg for infection, WBC wnl. CMP unremarkable. Source of infection is unclear at this time. Fever and heart rate improved with fluids and tylenol. Will consult hospitalist for admission for sepsis d/t unknown source.  Patient discussed with and seen by Dr. Silverio Lay.  The patient appears reasonably stabilized for admission considering the current resources, flow, and capabilities available in the ED at this time, and I doubt any other [facility] requiring further screening and/or treatment in the ED prior to admission.  Final Clinical Impressions(s) / ED Diagnoses   Final diagnoses:  Sepsis, due to unspecified organism [facility])    ED Discharge Orders    None       Kylyn Mcdade, [place] N, PA-C 05/20/17 2158    Charlynne Pander, MD 05/21/17 1440

## 2017-05-20 NOTE — H&P (Signed)
TRH H&P   Patient Demographics:    Dylan Jordan, is a 62 y.o. male  MRN: [id number]   DOB - 09/30/1955  Admit Date - 05/20/2017  Outpatient Primary MD for the patient is [name], DO  Referring MD/NP/PA:  Martinique Robinson  Outpatient Specialists:   Patient coming from: home  Chief Complaint  Patient presents with  . Dizziness      HPI:    Dylan Jordan  is a 62 y.o. male, hypertension, hyperlipidemia, dm2, h/o pneumonia  01/09/2017, recently gout attack (right 1st toe),  apparently c/o chills and fever and slight confusion.  Denies cough, cp, palp, sob, n/v, dysuria, hematuria.  Pt has chronic intermittent diarrhea.  Pt denies illicit drug use. No recent travel.   Pt presented to ED for evaluation   In ED,  Axox3, no neck stiffness. Pt febrile and tachycardic on admission  CXR  IMPRESSION: No evidence of acute cardiopulmonary disease.  Na 139, K 3.6, Bun 12, Creatinine 1.26, Ast 16, Alt 12 Glucose 185 Hco3 18  Wbc 9.2, Hgb 12.7, Plt 255  Blood culture x2 pending Influenza panel pending D dimer and troponin pending  Pt will be admitted for fever of unclear origin. R/o sepsis      Review of systems:    In addition to the HPI above,   No Headache, No changes with Vision or hearing, No problems swallowing food or Liquids, No Chest pain, Cough or Shortness of Breath, No Abdominal pain No Blood in stool or Urine, No dysuria, No new skin rashes or bruises, No new joints pains-aches,  No new weakness, tingling, numbness in any extremity, No recent weight gain or loss, No polyuria, polydypsia or polyphagia, No significant Mental Stressors.  A full 10 point Review of Systems was done, except as stated above, all other Review of Systems were negative.   With Past History of the following :    Past Medical History:  Diagnosis Date  . Anemia   .  Asthma   . Diabetes mellitus without complication ([place])   . Gout   . Hyperlipidemia   . Hypertension       Past Surgical History:  Procedure Laterality Date  . REPLACEMENT TOTAL KNEE Left   . TOE SURGERY        Social History:     Social History   Tobacco Use  . Smoking status: Current Every Day Smoker    Packs/day: 1.00    Years: 45.00    Pack years: 45.00    Types: Cigarettes    Start date: 05/16/1971  . Smokeless tobacco: Never Used  . Tobacco comment: Now smokes about 3 cigarettes a week  Substance Use Topics  . Alcohol use: No     Lives - at home  Mobility - walks by self   Family History :     Family History  Problem  Relation Age of Onset  . Allergic rhinitis Neg Hx   . Angioedema Neg Hx   . Asthma Neg Hx   . Atopy Neg Hx   . Eczema Neg Hx   . Immunodeficiency Neg Hx   . Urticaria Neg Hx       Home Medications:   Prior to Admission medications   Medication Sig Start Date End Date Taking? Authorizing Provider  acetaminophen (TYLENOL) 650 MG CR tablet Take 650 mg by mouth 2 (two) times daily as needed for pain.    Yes [provider]  allopurinol (ZYLOPRIM) 100 MG tablet Take 1 tablet (100 mg total) by mouth daily. 05/18/17  Yes Diallo, Abdoulaye, MD  amLODipine (NORVASC) 5 MG tablet Take 1 tablet (5 mg total) by mouth daily. 11/06/16  Yes [name], DO  aspirin EC 81 MG tablet Take 1 tablet (81 mg total) by mouth daily. 08/04/16  Yes [name], [name], DO  atorvastatin (LIPITOR) 20 MG tablet take 1 TABLET BY MOUTH EVERY DAY 01/22/17  Yes [name], [name], DO  benztropine (COGENTIN) 2 MG tablet Take 2 mg by mouth 2 (two) times daily.   Yes [provider]  cholecalciferol (VITAMIN D) 1000 units tablet Take 1,000 Units by mouth daily.   Yes [provider]  Cyanocobalamin (VITAMIN B-12) 6000 MCG SUBL Place 6,000 mcg under the tongue daily.   Yes [provider]  divalproex (DEPAKOTE) 500 MG DR tablet Take 500-2,000  mg by mouth See admin instructions. Take 559m every morning and 2000 tabs every evening   Yes [provider]  empagliflozin (JARDIANCE) 10 MG TABS tablet Take 10 mg by mouth daily. 05/04/17  Yes RLucila MaineC, DO  ferrous sulfate (FERROUSUL) 325 (65 FE) MG tablet Take 1 tablet (325 mg total) by mouth daily with breakfast. 01/10/17  Yes [name], [name], DO  gabapentin (NEURONTIN) 300 MG capsule Take 1 capsule (300 mg total) by mouth 3 (three) times daily as needed. 02/19/17  Yes [name], [name], DO  glimepiride (AMARYL) 4 MG tablet Take 1 tablet (4 mg total) by mouth 2 (two) times daily. 12/01/16  Yes GMercy Riding MD  haloperidol (HALDOL) 10 MG tablet Take 5 mg by mouth every evening.    Yes [provider]  haloperidol decanoate (HALDOL DECANOATE) 100 MG/ML injection Inject 400 mg into the muscle every 28 (twenty-eight) days.   Yes [provider]  ibuprofen (ADVIL,MOTRIN) 200 MG tablet Take 200 mg by mouth every 6 (six) hours as needed for mild pain.    Yes [provider]  indomethacin (INDOCIN) 50 MG capsule Take 50 mg by mouth 2 (two) times daily with a meal. 04/18/17  Yes [provider]  lithium carbonate 300 MG capsule Take 300 mg by mouth 2 (two) times daily with a meal.   Yes [provider]  losartan (COZAAR) 25 MG tablet TAKE 1 TABLET BY MOUTH EVERY DAY Patient taking differently: TAKE 236mBY MOUTH EVERY DAY 10/03/16  Yes TiSela HildingMD  metFORMIN (GLUCOPHAGE) 500 MG tablet Take 2 TABLET TWICE DAILY with meals 03/20/17  Yes [name], [name], DO  montelukast (SINGULAIR) 10 MG tablet take 1 TABLET BY MOUTH EVERY DAY Patient taking differently: take 1 TABLET BY MOUTH EVERY EVENING 01/22/17  Yes [name], [name], DO  Multiple Vitamin (DAILY-VITE) TABS TAKE 1 TABLET BY MOUTH DAILY. 10/03/16  Yes TiSela HildingMD  nicotine (NICODERM CQ - DOSED IN MG/24 HOURS) 21 mg/24hr patch Place  1 patch (21 mg total) onto the skin daily.  02/16/17  Yes [name], [name], DO  omeprazole (PRILOSEC) 20 MG capsule Take 2 capsules (40 mg total) by mouth daily. Patient taking differently: Take 40 mg by mouth every evening.  04/26/17  Yes [name], [name], DO  predniSONE (STERAPRED UNI-PAK 21 TAB) 10 MG (21) TBPK tablet Take by mouth daily. Take 6 tabs by mouth daily  for 2 days, then 5 tabs for 2 days, then 4 tabs for 2 days, then 3 tabs for 2 days, 2 tabs for 2 days, then 1 tab by mouth daily for 2 days 05/19/17  Yes Petrucelli, Samantha R, PA-C  traMADol (ULTRAM) 50 MG tablet Take 1 tablet (50 mg total) by mouth every 6 (six) hours as needed. 05/19/17  Yes Petrucelli, Samantha R, PA-C  ammonium lactate (AMLACTIN) 12 % cream Apply topically as needed for dry skin. Patient taking differently: Apply 1 g topically as needed for dry skin.  07/26/16   [name], DPM  cephALEXin (KEFLEX) 500 MG capsule Take 1 capsule (500 mg total) by mouth 2 (two) times daily. Patient not taking: Reported on 05/20/2017 04/17/17   Jeannett Senior, PA-C  cetirizine (ZYRTEC) 10 MG tablet Take 1 tablet (10 mg total) by mouth daily. 01/10/17   [name], DO  diclofenac (VOLTAREN) 75 MG EC tablet Take 1 tablet (75 mg total) by mouth 2 (two) times daily. Patient not taking: Reported on 05/20/2017 09/26/16   [name], MD  diclofenac sodium (VOLTAREN) 1 % GEL Apply 4 g topically 4 (four) times daily. 01/01/17   Mayo, [name], MD  fluticasone (FLONASE) 50 MCG/ACT nasal spray Place 2 sprays into both nostrils daily. Patient taking differently: Place 2 sprays into both nostrils daily as needed for allergies.  12/05/16   [name], DO  Fluticasone-Salmeterol (ADVAIR DISKUS) 250-50 MCG/DOSE AEPB Inhale 1 puff into the lungs 2 (two) times daily. Patient not taking: Reported on 05/20/2017 07/19/16   [name], DO  HYDROcodone-acetaminophen (NORCO) 5-325 MG tablet Take 1 tablet by mouth every 6 (six) hours as needed for moderate pain. Patient not taking:  Reported on 05/20/2017 04/17/17   Jeannett Senior, PA-C  phenylephrine-shark liver oil-mineral oil-petrolatum (PREPARATION H) 0.25-14-74.9 % rectal ointment Place 1 application rectally 2 (two) times daily as needed for hemorrhoids. Patient not taking: Reported on 05/20/2017 05/04/17   [name], DO     Allergies:     Allergies  Allergen Reactions  . Ativan [Lorazepam] Other (See Comments)    Agitation (and "5 others like it") Will need to call [facility]. [facility] [place] (phone) [phone] and [phone] to fax a release-   . Penicillins Diarrhea    Has patient had a PCN reaction causing immediate rash, facial/tongue/throat swelling, SOB or lightheadedness with hypotension: No Has patient had a PCN reaction causing severe rash involving mucus membranes or skin necrosis: No Has patient had a PCN reaction that required hospitalization: No Has patient had a PCN reaction occurring within the last 10 years: No If all of the above answers are "NO", then may proceed with Cephalosporin use.      Physical Exam:   Vitals  Blood pressure (!) 140/103, pulse 93, temperature 100.3 F (37.9 C), temperature source Oral, resp. rate 16, height '6\' 2"'  (1.88 m), weight 101.6 kg (224 lb), SpO2 98 %.   1. General  lying in bed in NAD,    2. Normal affect and insight,  Not Suicidal or Homicidal, Awake Alert, Oriented X 3.(person, place, time), no nuchal rigidity  3. No F.N deficits, ALL C.Nerves Intact, Strength 5/5 all 4 extremities, Sensation intact all 4 extremities, Plantars down going.  4. Ears and Eyes appear Normal, Conjunctivae clear, PERRLA. Moist Oral Mucosa.  5. Supple Neck, No JVD, No cervical lymphadenopathy appriciated, No Carotid Bruits.  6. Symmetrical Chest wall movement, Good air movement bilaterally, CTAB.  7. RRR, No Gallops, Rubs or Murmurs, No Parasternal Heave.  8. Positive Bowel Sounds, Abdomen Soft, No tenderness, No organomegaly appriciated,No rebound  -guarding or rigidity.  9.  No Cyanosis, Normal Skin Turgor, No Skin Rash or Bruise.  10. Good muscle tone,  joints appear normal , no effusions, Normal ROM.  11. No Palpable Lymph Nodes in Neck or Axillae  No janeway, no osler, no splinter,no conjunctival hemorrhage   Data Review:    CBC Recent Labs  Lab 05/20/17 1551  WBC 9.2  HGB 12.7*  HCT 39.6  PLT 255  MCV 90.0  MCH 28.9  MCHC 32.1  RDW 14.2  LYMPHSABS 0.8  MONOABS 0.7  EOSABS 0.1  BASOSABS 0.0   ------------------------------------------------------------------------------------------------------------------  Chemistries  Recent Labs  Lab 05/20/17 1551  NA 139  K 3.6  CL 114*  CO2 18*  GLUCOSE 185*  BUN 12  CREATININE 1.26*  CALCIUM 9.6  AST 16  ALT 12*  ALKPHOS 49  BILITOT 0.6   ------------------------------------------------------------------------------------------------------------------ estimated creatinine clearance is 79.4 mL/min (A) (by C-G formula based on SCr of 1.26 mg/dL (H)). ------------------------------------------------------------------------------------------------------------------ No results for input(s): TSH, T4TOTAL, T3FREE, THYROIDAB in the last 72 hours.  Invalid input(s): FREET3  Coagulation profile No results for input(s): INR, PROTIME in the last 168 hours. ------------------------------------------------------------------------------------------------------------------- No results for input(s): DDIMER in the last 72 hours. -------------------------------------------------------------------------------------------------------------------  Cardiac Enzymes No results for input(s): CKMB, TROPONINI, MYOGLOBIN in the last 168 hours.  Invalid input(s): CK ------------------------------------------------------------------------------------------------------------------ No results found for:  BNP   ---------------------------------------------------------------------------------------------------------------  Urinalysis    Component Value Date/Time   COLORURINE YELLOW 05/20/2017 1856   APPEARANCEUR CLEAR 05/20/2017 1856   LABSPEC 1.006 05/20/2017 1856   PHURINE 5.0 05/20/2017 1856   GLUCOSEU 150 (A) 05/20/2017 1856   HGBUR NEGATIVE 05/20/2017 1856   BILIRUBINUR NEGATIVE 05/20/2017 1856   KETONESUR NEGATIVE 05/20/2017 1856   PROTEINUR 30 (A) 05/20/2017 1856   NITRITE NEGATIVE 05/20/2017 1856   LEUKOCYTESUR NEGATIVE 05/20/2017 1856    ----------------------------------------------------------------------------------------------------------------   Imaging Results:    Dg Chest 2 View  Result Date: 05/20/2017 CLINICAL DATA:  Fever and dizziness. EXAM: CHEST  2 VIEW COMPARISON:  02/09/2017 and prior radiographs FINDINGS: The cardiomediastinal silhouette is unremarkable. Peribronchial thickening is unchanged. Unchanged appearance of the lungs from prior radiographs. There is no evidence of focal airspace disease, pulmonary edema, suspicious pulmonary nodule/mass, pleural effusion, or pneumothorax. No acute bony abnormalities are identified. IMPRESSION: No evidence of acute cardiopulmonary disease. Electronically Signed   By: Margarette Canada M.D.   On: 05/20/2017 16:02   St at 130, nl axis, early R progression   Assessment & Plan:    Principal Problem:   Fever Active Problems:   Type 2 diabetes mellitus with diabetic neuropathy (HCC)   Gout    Fever unclear origin, ? Early sepsis (fever , tachycardia, elevation in LA) Blood culture x2 Check ESR Influenza panel pending Check UDS Check cardiac echo vanco iv, levaquin iv pharmacy to dose  Confusion, resolved Check b12, folate, esr, rpr, ANA, Tsh, ammonia, depakote, lithium Check CT brain  Tachycardia Check trop I q6h x3 Check TSH Check d dimer If positive then CTA chest r/o PE  Diarrhea (chronic) STOP  METFORMIN Gi pathogen panel C. Diff Consider further w/up  Hypertension Cont Amlodipine Cont Losartan  Hyperlipidemia Cont Lipitor  Dm2 STOP METFORMIN DUE TO c/o DIARRHEA Cont Jardiance Cont Amaryl  Diabetic neuropathy Cont neurontin  Gerd Cont PPI  Gout Cont allopurinol  Asthma Cont Advair => dulera   DVT Prophylaxis Lovenox- SCDs   AM Labs Ordered, also please review Full Orders  Family Communication: Admission, patients condition and plan of care including tests being ordered have been discussed with the patient who indicate understanding and agree with the plan and Code Status.  Code Status FULL CODE  Likely DC to  home  Condition GUARDED    Consults called: none  Admission status: inpatient   Time spent in minutes : 45   Jani Gravel M.D on 05/20/2017 at 7:59 PM  Between 7am to 7pm - Pager - [phone]  . After 7pm go to [email or web address] - password [facility]  [facility] - Office  [phone]

## 2017-05-21 ENCOUNTER — Encounter (HOSPITAL_COMMUNITY): Payer: Self-pay

## 2017-05-21 ENCOUNTER — Inpatient Hospital Stay (HOSPITAL_COMMUNITY): Payer: Medicaid Other

## 2017-05-21 DIAGNOSIS — I5030 Unspecified diastolic (congestive) heart failure: Secondary | ICD-10-CM

## 2017-05-21 DIAGNOSIS — R509 Fever, unspecified: Secondary | ICD-10-CM

## 2017-05-21 LAB — ECHOCARDIOGRAM COMPLETE
AVLVOTPG: 6 mmHg
CHL CUP MV DEC (S): 296
CHL CUP RV SYS PRESS: 23 mmHg
E/e' ratio: 10.49
EWDT: 296 ms
FS: 39 % (ref 28–44)
HEIGHTINCHES: 74 in
IV/PV OW: 0.97
LA ID, A-P, ES: 33 mm
LA diam end sys: 33 mm
LA diam index: 1.43 cm/m2
LA vol A4C: 69.8 ml
LA vol index: 27.2 mL/m2
LA vol: 62.9 mL
LV E/e' medial: 10.49
LV TDI E'MEDIAL: 5.98
LV dias vol index: 39 mL/m2
LV dias vol: 91 mL (ref 62–150)
LVEEAVG: 10.49
LVELAT: 6.96 cm/s
LVOT VTI: 23.9 cm
LVOT area: 4.52 cm2
LVOT diameter: 24 mm
LVOTPV: 120 cm/s
LVOTSV: 108 mL
MV Peak grad: 2 mmHg
MV pk A vel: 83 m/s
MVPKEVEL: 73 m/s
PW: 11.6 mm — AB (ref 0.6–1.1)
RV LATERAL S' VELOCITY: 16.1 cm/s
RV TAPSE: 21.4 mm
Reg peak vel: 195 cm/s
TDI e' lateral: 6.96
TR max vel: 195 cm/s
WEIGHTICAEL: 3562.63 [oz_av]

## 2017-05-21 LAB — COMPREHENSIVE METABOLIC PANEL
ALBUMIN: 2.8 g/dL — AB (ref 3.5–5.0)
ALT: 11 U/L — AB (ref 17–63)
AST: 11 U/L — ABNORMAL LOW (ref 15–41)
Alkaline Phosphatase: 42 U/L (ref 38–126)
Anion gap: 3 — ABNORMAL LOW (ref 5–15)
BUN: 10 mg/dL (ref 6–20)
CHLORIDE: 119 mmol/L — AB (ref 101–111)
CO2: 21 mmol/L — ABNORMAL LOW (ref 22–32)
CREATININE: 1.01 mg/dL (ref 0.61–1.24)
Calcium: 9.1 mg/dL (ref 8.9–10.3)
GFR calc Af Amer: >60 mL/min (ref >60)
GFR calc non Af Amer: >60 mL/min (ref >60)
GLUCOSE: 158 mg/dL — AB (ref 65–99)
POTASSIUM: 4.3 mmol/L (ref 3.5–5.1)
Sodium: 143 mmol/L (ref 135–145)
Total Bilirubin: 0.8 mg/dL (ref 0.3–1.2)
Total Protein: 6 g/dL — ABNORMAL LOW (ref 6.5–8.1)

## 2017-05-21 LAB — GI PROFILE, STOOL, PCR

## 2017-05-21 LAB — TROPONIN I: Troponin I: <0.03 ng/mL (ref <0.03)

## 2017-05-21 LAB — GLUCOSE, CAPILLARY
GLUCOSE-CAPILLARY: 285 mg/dL — AB (ref 65–99)
GLUCOSE-CAPILLARY: 188 mg/dL — AB (ref 65–99)
GLUCOSE-CAPILLARY: 357 mg/dL — AB (ref 65–99)
Glucose-Capillary: 150 mg/dL — ABNORMAL HIGH (ref 65–99)

## 2017-05-21 LAB — VITAMIN B12: VITAMIN B 12: 839 pg/mL (ref 180–914)

## 2017-05-21 LAB — CBC
HEMATOCRIT: 33.9 % — AB (ref 39.0–52.0)
Hemoglobin: 10.7 g/dL — ABNORMAL LOW (ref 13.0–17.0)
MCH: 28.8 pg (ref 26.0–34.0)
MCHC: 31.6 g/dL (ref 30.0–36.0)
MCV: 91.1 fL (ref 78.0–100.0)
PLATELETS: 241 10*3/uL (ref 150–400)
RBC: 3.72 MIL/uL — ABNORMAL LOW (ref 4.22–5.81)
RDW: 14.5 % (ref 11.5–15.5)
WBC: 15.6 10*3/uL — ABNORMAL HIGH (ref 4.0–10.5)

## 2017-05-21 LAB — HIV ANTIBODY (ROUTINE TESTING W REFLEX): HIV SCREEN 4TH GENERATION: NONREACTIVE

## 2017-05-21 LAB — CBG MONITORING, ED: Glucose-Capillary: 129 mg/dL — ABNORMAL HIGH (ref 65–99)

## 2017-05-21 LAB — AMMONIA: AMMONIA: 24 umol/L (ref 9–35)

## 2017-05-21 LAB — HEMOGLOBIN A1C
Hgb A1c MFr Bld: 11.7 % — ABNORMAL HIGH (ref 4.8–5.6)
Mean Plasma Glucose: 289.09 mg/dL

## 2017-05-21 MED ORDER — INSULIN ASPART 100 UNIT/ML ~~LOC~~ SOLN
0.0000 [IU] | Freq: Every day | SUBCUTANEOUS | Status: DC
  Administered 2017-05-21: 5 [IU] via SUBCUTANEOUS
  Administered 2017-05-22: 4 [IU] via SUBCUTANEOUS

## 2017-05-21 MED ORDER — LORATADINE 10 MG PO TABS
10.0000 mg | ORAL_TABLET | Freq: Every day | ORAL | Status: DC
  Administered 2017-05-21 – 2017-05-22 (×2): 10 mg via ORAL
  Filled 2017-05-21 (×3): qty 1

## 2017-05-21 MED ORDER — PANTOPRAZOLE SODIUM 40 MG PO TBEC
40.0000 mg | DELAYED_RELEASE_TABLET | Freq: Every day | ORAL | Status: DC
  Administered 2017-05-21 – 2017-05-22 (×2): 40 mg via ORAL
  Filled 2017-05-21 (×3): qty 1

## 2017-05-21 MED ORDER — LITHIUM CARBONATE 300 MG PO CAPS
300.0000 mg | ORAL_CAPSULE | Freq: Two times a day (BID) | ORAL | Status: DC
  Administered 2017-05-21 – 2017-05-23 (×5): 300 mg via ORAL
  Filled 2017-05-21 (×6): qty 1

## 2017-05-21 MED ORDER — LOSARTAN POTASSIUM 25 MG PO TABS
25.0000 mg | ORAL_TABLET | Freq: Every day | ORAL | Status: DC
  Administered 2017-05-21 – 2017-05-22 (×2): 25 mg via ORAL
  Filled 2017-05-21 (×3): qty 1

## 2017-05-21 MED ORDER — IOPAMIDOL (ISOVUE-370) INJECTION 76%
INTRAVENOUS | Status: AC
  Administered 2017-05-21: 100 mL via INTRAVENOUS
  Filled 2017-05-21: qty 100

## 2017-05-21 MED ORDER — ENOXAPARIN SODIUM 40 MG/0.4ML ~~LOC~~ SOLN
40.0000 mg | Freq: Every day | SUBCUTANEOUS | Status: DC
  Administered 2017-05-21 – 2017-05-22 (×3): 40 mg via SUBCUTANEOUS
  Filled 2017-05-21 (×3): qty 0.4

## 2017-05-21 MED ORDER — TRAMADOL HCL 50 MG PO TABS
50.0000 mg | ORAL_TABLET | Freq: Four times a day (QID) | ORAL | Status: DC | PRN
  Administered 2017-05-21 – 2017-05-23 (×4): 50 mg via ORAL
  Filled 2017-05-21 (×4): qty 1

## 2017-05-21 MED ORDER — HALOPERIDOL 5 MG PO TABS
5.0000 mg | ORAL_TABLET | Freq: Every evening | ORAL | Status: DC
  Administered 2017-05-21 – 2017-05-22 (×2): 5 mg via ORAL
  Filled 2017-05-21 (×3): qty 1

## 2017-05-21 MED ORDER — ACETAMINOPHEN 650 MG RE SUPP: 650.0000 mg | Freq: Four times a day (QID) | RECTAL | Status: DC | PRN

## 2017-05-21 MED ORDER — VANCOMYCIN HCL IN DEXTROSE 1-5 GM/200ML-% IV SOLN
1000.0000 mg | Freq: Two times a day (BID) | INTRAVENOUS | Status: AC
  Administered 2017-05-21 – 2017-05-22 (×4): 1000 mg via INTRAVENOUS
  Filled 2017-05-21 (×4): qty 200

## 2017-05-21 MED ORDER — PREDNISONE 20 MG PO TABS
10.0000 mg | ORAL_TABLET | Freq: Every day | ORAL | Status: DC
  Administered 2017-05-21 – 2017-05-22 (×2): 10 mg via ORAL
  Filled 2017-05-21 (×2): qty 1

## 2017-05-21 MED ORDER — GLIMEPIRIDE 4 MG PO TABS
4.0000 mg | ORAL_TABLET | Freq: Two times a day (BID) | ORAL | Status: DC
  Administered 2017-05-21 – 2017-05-23 (×5): 4 mg via ORAL
  Filled 2017-05-21 (×7): qty 1

## 2017-05-21 MED ORDER — AMLODIPINE BESYLATE 5 MG PO TABS
5.0000 mg | ORAL_TABLET | Freq: Every day | ORAL | Status: DC
  Administered 2017-05-21 – 2017-05-22 (×2): 5 mg via ORAL
  Filled 2017-05-21 (×3): qty 1

## 2017-05-21 MED ORDER — GABAPENTIN 300 MG PO CAPS
300.0000 mg | ORAL_CAPSULE | Freq: Three times a day (TID) | ORAL | Status: DC | PRN
  Administered 2017-05-21 – 2017-05-22 (×3): 300 mg via ORAL
  Filled 2017-05-21 (×3): qty 1

## 2017-05-21 MED ORDER — ADULT MULTIVITAMIN W/MINERALS CH
1.0000 | ORAL_TABLET | Freq: Every day | ORAL | Status: DC
  Administered 2017-05-21 – 2017-05-23 (×3): 1 via ORAL
  Filled 2017-05-21 (×3): qty 1

## 2017-05-21 MED ORDER — VITAMIN D3 25 MCG (1000 UNIT) PO TABS
1000.0000 [IU] | ORAL_TABLET | Freq: Every day | ORAL | Status: DC
  Administered 2017-05-21 – 2017-05-23 (×3): 1000 [IU] via ORAL
  Filled 2017-05-21 (×3): qty 1

## 2017-05-21 MED ORDER — ACETAMINOPHEN 325 MG PO TABS
650.0000 mg | ORAL_TABLET | Freq: Four times a day (QID) | ORAL | Status: DC | PRN
  Administered 2017-05-21 – 2017-05-22 (×3): 650 mg via ORAL
  Filled 2017-05-21 (×3): qty 2

## 2017-05-21 MED ORDER — ALLOPURINOL 100 MG PO TABS
100.0000 mg | ORAL_TABLET | Freq: Every day | ORAL | Status: DC
  Administered 2017-05-21 – 2017-05-23 (×3): 100 mg via ORAL
  Filled 2017-05-21 (×3): qty 1

## 2017-05-21 MED ORDER — SODIUM CHLORIDE 0.9 % IV SOLN
INTRAVENOUS | Status: AC
  Administered 2017-05-21: 75 mL/h via INTRAVENOUS

## 2017-05-21 MED ORDER — CANAGLIFLOZIN 100 MG PO TABS
100.0000 mg | ORAL_TABLET | Freq: Every day | ORAL | Status: DC
  Administered 2017-05-21 – 2017-05-22 (×2): 100 mg via ORAL
  Filled 2017-05-21 (×3): qty 1

## 2017-05-21 MED ORDER — DIVALPROEX SODIUM 500 MG PO DR TAB
2000.0000 mg | DELAYED_RELEASE_TABLET | Freq: Every day | ORAL | Status: DC
  Administered 2017-05-21 – 2017-05-22 (×2): 2000 mg via ORAL
  Filled 2017-05-21: qty 8
  Filled 2017-05-21 (×2): qty 4
  Filled 2017-05-21: qty 8

## 2017-05-21 MED ORDER — FERROUS SULFATE 325 (65 FE) MG PO TABS
325.0000 mg | ORAL_TABLET | Freq: Every day | ORAL | Status: DC
  Administered 2017-05-21 – 2017-05-23 (×3): 325 mg via ORAL
  Filled 2017-05-21 (×4): qty 1

## 2017-05-21 MED ORDER — VITAMIN B-12 1000 MCG PO TABS
6000.0000 ug | ORAL_TABLET | Freq: Every day | ORAL | Status: DC
  Administered 2017-05-21 – 2017-05-22 (×2): 6000 ug via ORAL
  Filled 2017-05-21 (×2): qty 6

## 2017-05-21 MED ORDER — IOPAMIDOL (ISOVUE-370) INJECTION 76%
100.0000 mL | Freq: Once | INTRAVENOUS | Status: AC | PRN
  Administered 2017-05-21: 100 mL via INTRAVENOUS

## 2017-05-21 MED ORDER — DIVALPROEX SODIUM 250 MG PO DR TAB
500.0000 mg | DELAYED_RELEASE_TABLET | Freq: Every day | ORAL | Status: DC
  Administered 2017-05-21 – 2017-05-23 (×3): 500 mg via ORAL
  Filled 2017-05-21 (×3): qty 2

## 2017-05-21 MED ORDER — DIVALPROEX SODIUM 500 MG PO DR TAB
500.0000 mg | DELAYED_RELEASE_TABLET | ORAL | Status: DC
Start: 2017-05-21 — End: 2017-05-21

## 2017-05-21 MED ORDER — HALOPERIDOL DECANOATE 100 MG/ML IM SOLN: 400.0000 mg | INTRAMUSCULAR | Status: DC

## 2017-05-21 MED ORDER — MONTELUKAST SODIUM 10 MG PO TABS
10.0000 mg | ORAL_TABLET | Freq: Every evening | ORAL | Status: DC
  Administered 2017-05-21: 10 mg via ORAL
  Filled 2017-05-21 (×3): qty 1

## 2017-05-21 MED ORDER — ATORVASTATIN CALCIUM 20 MG PO TABS
20.0000 mg | ORAL_TABLET | Freq: Every day | ORAL | Status: DC
  Administered 2017-05-21 – 2017-05-22 (×2): 20 mg via ORAL
  Filled 2017-05-21 (×3): qty 1

## 2017-05-21 MED ORDER — ASPIRIN EC 81 MG PO TBEC
81.0000 mg | DELAYED_RELEASE_TABLET | Freq: Every day | ORAL | Status: DC
  Administered 2017-05-21 – 2017-05-22 (×2): 81 mg via ORAL
  Filled 2017-05-21 (×3): qty 1

## 2017-05-21 MED ORDER — BENZTROPINE MESYLATE 0.5 MG PO TABS
2.0000 mg | ORAL_TABLET | Freq: Two times a day (BID) | ORAL | Status: DC
  Administered 2017-05-21 – 2017-05-23 (×6): 2 mg via ORAL
  Filled 2017-05-21 (×5): qty 4
  Filled 2017-05-21: qty 2

## 2017-05-21 MED ORDER — NICOTINE 21 MG/24HR TD PT24
21.0000 mg | MEDICATED_PATCH | Freq: Every day | TRANSDERMAL | Status: DC
  Administered 2017-05-21 – 2017-05-23 (×4): 21 mg via TRANSDERMAL
  Filled 2017-05-21 (×4): qty 1

## 2017-05-21 MED ORDER — INSULIN ASPART 100 UNIT/ML ~~LOC~~ SOLN
0.0000 [IU] | Freq: Three times a day (TID) | SUBCUTANEOUS | Status: DC
  Administered 2017-05-21: 2 [IU] via SUBCUTANEOUS
  Administered 2017-05-21: 1 [IU] via SUBCUTANEOUS
  Administered 2017-05-21: 5 [IU] via SUBCUTANEOUS
  Administered 2017-05-22: 1 [IU] via SUBCUTANEOUS
  Administered 2017-05-22: 7 [IU] via SUBCUTANEOUS
  Administered 2017-05-22: 2 [IU] via SUBCUTANEOUS
  Administered 2017-05-23: 3 [IU] via SUBCUTANEOUS

## 2017-05-21 NOTE — Progress Notes (Signed)
*  PRELIMINARY RESULTS* Echocardiogram 2D Echocardiogram has been performed.  Stacey DrainWhite, Stacie Templin J 05/21/2017, 12:55 PM

## 2017-05-21 NOTE — Progress Notes (Signed)
Inpatient Diabetes Program Recommendations  AACE/ADA: New Consensus Statement on Inpatient Glycemic Control (2015)  Target Ranges:  Prepandial:   less than 140 mg/dL      Peak postprandial:   less than 180 mg/dL (1-2 hours)      Critically ill patients:  140 - 180 mg/dL   Lab Results  Component Value Date   GLUCAP 285 (H) 05/21/2017   HGBA1C 11.7 (H) 05/21/2017    Review of Glycemic Control Results for Dylan Jordan, Dylan Jordan (MRN [id number]) as of 05/21/2017 12:38  Ref. Range 05/21/2017 02:43 05/21/2017 07:46 05/21/2017 07:56 05/21/2017 11:24  Glucose-Capillary Latest Ref Range: 65 - 99 mg/dL 409129 (H) 811150 (H)  914285 (H)   Diabetes history: DM Type 2 Outpatient Diabetes medications: Metformin 1,000mg  BID, Amaryl 4mg  BID, Jardiance 10mg  QD Current orders for Inpatient glycemic control: Novolog 0-5 Units HS, Novolog 0-9 Units TID with meals, Amaryl 4mg  BID  Inpatient Diabetes Program Recommendations:    Noted order for Prednisone 10 mg QD, can expect blood sugars to increase as a result. While on the Prednisone, would recommend Lantus 15 Units QHS, Novolog 3 Units TID with meals, and discontinuing oral medication such as Amaryl (as this does not meet standard of care for proper blood sugar control while inpatient). As prednisone begins to be tapered, adjust insulin as needed.   Thanks, Lujean RaveLauren Ladine Kiper, MSN, RNC-OB Diabetes Coordinator [phone] (8a-5p)

## 2017-05-21 NOTE — Progress Notes (Signed)
Pharmacy Antibiotic Note  Dylan Jordan is a 62 y.o. male admitted on 05/20/2017 with sepsis.  Pharmacy has been consulted for vancomycin, levofloxacin dosing.  Plan: Vancomycin 1gm iv q12hr Goal AUC = 400 - 500 for all indications, except meningitis (goal AUC > 500 and Cmin 15-20 mcg/mL)  Levofloxacin 750mg  iv Q24hr  Height: 6\' 2"  (188 cm) Weight: 222 lb 10.6 oz (101 kg) IBW/kg (Calculated) : 82.2  Temp (24hrs), Avg:99.9 F (37.7 C), Min:98.1 F (36.7 C), Max:101.3 F (38.5 C)  Recent Labs  Lab 05/20/17 1551 05/20/17 1604 05/20/17 1831  WBC 9.2  --   --   CREATININE 1.26*  --   --   LATICACIDVEN  --  2.58* 1.20    Estimated Creatinine Clearance: 79.1 mL/min (A) (by C-G formula based on SCr of 1.26 mg/dL (H)).    Allergies  Allergen Reactions  . Ativan [Lorazepam] Other (See Comments)    Agitation (and "5 others like it") [facility] [place] (phone) [phone] and [phone] to fax a release-   . Penicillins Diarrhea    Has patient had a PCN reaction causing immediate rash, facial/tongue/throat swelling, SOB or lightheadedness with hypotension: No Has patient had a PCN reaction causing severe rash involving mucus membranes or skin necrosis: No Has patient had a PCN reaction that required hospitalization: No Has patient had a PCN reaction occurring within the last 10 years: No If all of the above answers are "NO", then may proceed with Cephalosporin use.     Antimicrobials this admission: Vancomycin 05/20/2017 >> Levofloxacin 05/20/2017 >>  Dose adjustments this admission: -  Microbiology results: pending  Thank you for allowing pharmacy to be a part of this patient's care.  Aleene DavidsonGrimsley Jr, Mandeep Kiser Crowford 05/21/2017 5:19 AM

## 2017-05-21 NOTE — ED Notes (Signed)
ED TO INPATIENT HANDOFF REPORT  Name/Age/Gender Dylan Jordan 62 y.o. male  Code Status    Code Status Orders  (From admission, onward)        Start     Ordered   05/21/17 0146  Full code  Continuous     05/21/17 0145    Code Status History    Date Active Date Inactive Code Status Order ID Comments User Context   This patient has a current code status but no historical code status.      Home/SNF/Other Home  Chief Complaint Light Headness/ Dizzeness   Level of Care/Admitting Diagnosis ED Disposition    ED Disposition Condition Comment   Admit  Hospital Area: [place] [[id number]]  Level of Care: Telemetry [5]  Admit to tele based on following criteria: Monitor for Ischemic changes  Diagnosis: Fever [344092]  Admitting Physician: Jani Gravel [[id number]]  Attending Physician: Jani Gravel [phone]  Estimated length of stay: past midnight tomorrow  Certification:: I certify this patient will need inpatient services for at least 2 midnights  PT Class (Do Not Modify): Inpatient [101]  PT Acc Code (Do Not Modify): Private [1]       Medical History Past Medical History:  Diagnosis Date  . Anemia   . Asthma   . Diabetes mellitus without complication ([place])   . Gout   . Hyperlipidemia   . Hypertension     Allergies Allergies  Allergen Reactions  . Ativan [Lorazepam] Other (See Comments)    Agitation (and "5 others like it") Will need to call [facility]. [facility] [place] (phone) [phone] and [phone] to fax a release-   . Penicillins Diarrhea    Has patient had a PCN reaction causing immediate rash, facial/tongue/throat swelling, SOB or lightheadedness with hypotension: No Has patient had a PCN reaction causing severe rash involving mucus membranes or skin necrosis: No Has patient had a PCN reaction that required hospitalization: No Has patient had a PCN reaction occurring within the last 10 years: No If all of the above answers are "NO",  then may proceed with Cephalosporin use.     IV Location/Drains/Wounds Patient Lines/Drains/Airways Status   Active Line/Drains/Airways    Name:   Placement date:   Placement time:   Site:   Days:   Peripheral IV 05/20/17 Right Arm   05/20/17    1551    Arm   1   Peripheral IV 05/20/17 Left Arm   05/20/17    1551    Arm   1          Labs/Imaging Results for orders placed or performed during the hospital encounter of 05/20/17 (from the past 48 hour(s))  CBG monitoring, ED     Status: Abnormal   Collection Time: 05/20/17  3:40 PM  Result Value Ref Range   Glucose-Capillary 185 (H) 65 - 99 mg/dL  Comprehensive metabolic panel     Status: Abnormal   Collection Time: 05/20/17  3:51 PM  Result Value Ref Range   Sodium 139 135 - 145 mmol/L   Potassium 3.6 3.5 - 5.1 mmol/L   Chloride 114 (H) 101 - 111 mmol/L   CO2 18 (L) 22 - 32 mmol/L   Glucose, Bld 185 (H) 65 - 99 mg/dL   BUN 12 6 - 20 mg/dL   Creatinine, Ser 1.26 (H) 0.61 - 1.24 mg/dL   Calcium 9.6 8.9 - 10.3 mg/dL   Total Protein 7.7 6.5 - 8.1 g/dL   Albumin  3.7 3.5 - 5.0 g/dL   AST 16 15 - 41 U/L   ALT 12 (L) 17 - 63 U/L   Alkaline Phosphatase 49 38 - 126 U/L   Total Bilirubin 0.6 0.3 - 1.2 mg/dL   GFR calc non Af Amer >60 >60 mL/min   GFR calc Af Amer >60 >60 mL/min    Comment: (NOTE) The eGFR has been calculated using the CKD EPI equation. This calculation has not been validated in all clinical situations. eGFR's persistently <60 mL/min signify possible Chronic Kidney Disease.    Anion gap 7 5 - 15  CBC with Differential     Status: Abnormal   Collection Time: 05/20/17  3:51 PM  Result Value Ref Range   WBC 9.2 4.0 - 10.5 K/uL   RBC 4.40 4.22 - 5.81 MIL/uL   Hemoglobin 12.7 (L) 13.0 - 17.0 g/dL   HCT 39.6 39.0 - 52.0 %   MCV 90.0 78.0 - 100.0 fL   MCH 28.9 26.0 - 34.0 pg   MCHC 32.1 30.0 - 36.0 g/dL   RDW 14.2 11.5 - 15.5 %   Platelets 255 150 - 400 K/uL   Neutrophils Relative % 82 %   Neutro Abs 7.6 1.7 -  7.7 K/uL   Lymphocytes Relative 9 %   Lymphs Abs 0.8 0.7 - 4.0 K/uL   Monocytes Relative 8 %   Monocytes Absolute 0.7 0.1 - 1.0 K/uL   Eosinophils Relative 1 %   Eosinophils Absolute 0.1 0.0 - 0.7 K/uL   Basophils Relative 0 %   Basophils Absolute 0.0 0.0 - 0.1 K/uL  Blood culture (routine x 2)     Status: None (Preliminary result)   Collection Time: 05/20/17  3:51 PM  Result Value Ref Range   Specimen Description      BLOOD RIGHT ARM Performed at [facility], [place] [address] [place], [place] [address]    Special Requests      BOTTLES DRAWN AEROBIC AND ANAEROBIC Blood Culture adequate volume   Culture PENDING    Report Status PENDING   Blood culture (routine x 2)     Status: None (Preliminary result)   Collection Time: 05/20/17  3:51 PM  Result Value Ref Range   Specimen Description      BLOOD LEFT FOREARM Performed at [facility], [place] [address], [place], [place] [address]    Special Requests      BOTTLES DRAWN AEROBIC AND ANAEROBIC Blood Culture adequate volume   Culture PENDING    Report Status PENDING   I-Stat CG4 Lactic Acid, ED     Status: Abnormal   Collection Time: 05/20/17  4:04 PM  Result Value Ref Range   Lactic Acid, Venous 2.58 (HH) 0.5 - 1.9 mmol/L   Comment NOTIFIED PHYSICIAN   I-Stat CG4 Lactic Acid, ED     Status: None   Collection Time: 05/20/17  6:31 PM  Result Value Ref Range   Lactic Acid, Venous 1.20 0.5 - 1.9 mmol/L  Urinalysis, Routine w reflex microscopic     Status: Abnormal   Collection Time: 05/20/17  6:56 PM  Result Value Ref Range   Color, Urine YELLOW YELLOW   APPearance CLEAR CLEAR   Specific Gravity, Urine 1.006 1.005 - 1.030   pH 5.0 5.0 - 8.0   Glucose, UA 150 (A) NEGATIVE mg/dL   Hgb urine dipstick NEGATIVE NEGATIVE   Bilirubin Urine NEGATIVE NEGATIVE   Ketones, ur NEGATIVE NEGATIVE mg/dL   Protein, ur  30 (A) NEGATIVE mg/dL   Nitrite NEGATIVE NEGATIVE   Leukocytes, UA NEGATIVE NEGATIVE   RBC / HPF 0-5 0 - 5  RBC/hpf   WBC, UA 0-5 0 - 5 WBC/hpf   Bacteria, UA RARE (A) NONE SEEN   Squamous Epithelial / LPF NONE SEEN NONE SEEN   Mucus PRESENT   Influenza panel by PCR (type A & B)     Status: None   Collection Time: 05/20/17  7:32 PM  Result Value Ref Range   Influenza A By PCR NEGATIVE NEGATIVE   Influenza B By PCR NEGATIVE NEGATIVE    Comment: (NOTE) The Xpert Xpress Flu assay is intended as an aid in the diagnosis of  influenza and should not be used as a sole basis for treatment.  This  assay is FDA approved for nasopharyngeal swab specimens only. Nasal  washings and aspirates are unacceptable for Xpert Xpress Flu testing.   D-dimer, quantitative (not at [facility])     Status: Abnormal   Collection Time: 05/20/17  9:15 PM  Result Value Ref Range   D-Dimer, Quant 0.64 (H) 0.00 - 0.50 ug/mL-FEU    Comment: (NOTE) At the manufacturer cut-off of 0.50 ug/mL FEU, this assay has been documented to exclude PE with a sensitivity and negative predictive value of 97 to 99%.  At this time, this assay has not been approved by the FDA to exclude DVT/VTE. Results should be correlated with clinical presentation.   Troponin I (q 6hr x 3)     Status: None   Collection Time: 05/20/17  9:15 PM  Result Value Ref Range   Troponin I <0.03 <0.03 ng/mL  Vitamin B12     Status: None   Collection Time: 05/20/17  9:15 PM  Result Value Ref Range   Vitamin B-12 839 180 - 914 pg/mL    Comment: (NOTE) This assay is not validated for testing neonatal or myeloproliferative syndrome specimens for Vitamin B12 levels. Performed at [facility], [place] [address], [place], [place] [address]   Sedimentation rate     Status: None   Collection Time: 05/20/17  9:15 PM  Result Value Ref Range   Sed Rate 9 0 - 16 mm/hr  TSH     Status: None   Collection Time: 05/20/17  9:15 PM  Result Value Ref Range   TSH 1.181 0.350 - 4.500 uIU/mL    Comment: Performed by a 3rd Generation assay with a functional sensitivity of  <=0.01 uIU/mL.  Lithium level     Status: Abnormal   Collection Time: 05/20/17  9:16 PM  Result Value Ref Range   Lithium Lvl 0.38 (L) 0.60 - 1.20 mmol/L  Valproic acid level     Status: Abnormal   Collection Time: 05/20/17  9:16 PM  Result Value Ref Range   Valproic Acid Lvl 37 (L) 50.0 - 100.0 ug/mL  Ammonia     Status: None   Collection Time: 05/21/17 12:06 AM  Result Value Ref Range   Ammonia 24 9 - 35 umol/L  Troponin I (q 6hr x 3)     Status: None   Collection Time: 05/21/17  1:48 AM  Result Value Ref Range   Troponin I <0.03 <0.03 ng/mL  POC CBG, ED     Status: Abnormal   Collection Time: 05/21/17  2:43 AM  Result Value Ref Range   Glucose-Capillary 129 (H) 65 - 99 mg/dL   Comment 1 Notify RN    Dg Chest 2 View  Result Date: 05/20/2017 CLINICAL DATA:  Fever and dizziness. EXAM: CHEST  2 VIEW COMPARISON:  02/09/2017 and prior radiographs FINDINGS: The cardiomediastinal silhouette is unremarkable. Peribronchial thickening is unchanged. Unchanged appearance of the lungs from prior radiographs. There is no evidence of focal airspace disease, pulmonary edema, suspicious pulmonary nodule/mass, pleural effusion, or pneumothorax. No acute bony abnormalities are identified. IMPRESSION: No evidence of acute cardiopulmonary disease. Electronically Signed   By: Margarette Canada M.D.   On: 05/20/2017 16:02   Ct Head Wo Contrast  Result Date: 05/20/2017 CLINICAL DATA:  Confusion, fever, and chills. EXAM: CT HEAD WITHOUT CONTRAST TECHNIQUE: Contiguous axial images were obtained from the base of the skull through the vertex without intravenous contrast. COMPARISON:  None. FINDINGS: Brain: There is no evidence of acute infarct, intracranial hemorrhage, mass, midline shift, or extra-axial fluid collection. Cerebral atrophy is mildly advanced for age. Vascular: Calcified atherosclerosis at the skullbase. No hyperdense vessel. Skull: No fracture or focal osseous lesion. Sinuses/Orbits: Mild right ethmoid  air cell opacification at the mild right ethmoid air cell mucosal thickening. Clear mastoid air cells. Unremarkable orbits. Other: None. IMPRESSION: 1. No evidence of acute intracranial abnormality. 2. Mildly age advanced cerebral atrophy. Electronically Signed   By: Logan Bores M.D.   On: 05/20/2017 22:10   Ct Angio Chest Pe W And/or Wo Contrast  Result Date: 05/21/2017 CLINICAL DATA:  62 y/o M; lightheadedness, weakness, dizziness. PE suspected, intermediate probability, positive D-dimer. EXAM: CT ANGIOGRAPHY CHEST WITH CONTRAST TECHNIQUE: Multidetector CT imaging of the chest was performed using the standard protocol during bolus administration of intravenous contrast. Multiplanar CT image reconstructions and MIPs were obtained to evaluate the vascular anatomy. CONTRAST:  100 cc Isovue-300 COMPARISON:  None. FINDINGS: Cardiovascular: Respiratory motion artifact greatest in left lung base. Satisfactory opacification of the pulmonary arteries to the segmental level. No evidence of pulmonary embolism. Normal heart size. No pericardial effusion. Mediastinum/Nodes: No enlarged mediastinal, hilar, or axillary lymph nodes. Thyroid gland, trachea, and esophagus demonstrate no significant findings. Lungs/Pleura: Lungs are clear. No pleural effusion or pneumothorax. Upper Abdomen: No acute abnormality. Musculoskeletal: No chest wall abnormality. No acute or significant osseous findings. Review of the MIP images confirms the above findings. IMPRESSION: 1. Respiratory motion artifact greatest in left lung base. No pulmonary embolus identified. 2. No acute pulmonary process identified. Electronically Signed   By: Kristine Garbe M.D.   On: 05/21/2017 02:56    Pending Labs Unresulted Labs (From admission, onward)   Start     Ordered   05/27/17 0500  Creatinine, serum  (enoxaparin (LOVENOX)    CrCl >/= 30 ml/min)  Weekly,   R    Comments:  while on enoxaparin therapy    05/21/17 0145   05/21/17 0500   Comprehensive metabolic panel  Tomorrow morning,   R     05/21/17 0145   05/21/17 0500  CBC  Tomorrow morning,   R     05/21/17 0145   05/21/17 0146  HIV antibody (Routine Testing)  Once,   R     05/21/17 0145   05/21/17 0146  Hemoglobin A1c  Once,   R    Comments:  To assess prior glycemic control    05/21/17 0145   05/20/17 2105  Gastrointestinal Panel by PCR , Stool  (Gastrointestinal Panel by PCR, Stool)  Once,   R     05/20/17 2104   05/20/17 2105  C difficile quick scan w PCR reflex  (C Difficile quick screen w PCR reflex panel)  Once, for 48 hours,   R    Comments:  Laxatives (last 72 hours)   None     Question Answer Comment  Is your patient experiencing loose or watery stools (3 or more in 24 hours)? Yes   Has the patient received laxatives in the last 24 hours? No   Has a negative Cdiff test resulted in the last 7 days? No      05/20/17 2104   05/20/17 2102  RPR  Once,   R     05/20/17 2103   05/20/17 1949  Troponin I (q 6hr x 3)  Now then every 6 hours,   R     05/20/17 1949   05/20/17 1533  Urine culture  STAT,   STAT     05/20/17 1532      Vitals/Pain Today's Vitals   05/21/17 0400 05/21/17 0415 05/21/17 0430 05/21/17 0443  BP: (!) 104/54 (!) 122/54 (!) 114/58   Pulse: 77 81 80   Resp: 18 (!) 22 (!) 22   Temp:      TempSrc:      SpO2: 100% 98% 98%   Weight:      Height:      PainSc:    0-No pain    Isolation Precautions Enteric precautions (UV disinfection)  Medications Medications  allopurinol (ZYLOPRIM) tablet 100 mg (not administered)  amLODipine (NORVASC) tablet 5 mg (not administered)  aspirin EC tablet 81 mg (not administered)  atorvastatin (LIPITOR) tablet 20 mg (not administered)  benztropine (COGENTIN) tablet 2 mg (2 mg Oral Given 05/21/17 0328)  loratadine (CLARITIN) tablet 10 mg (not administered)  cholecalciferol (VITAMIN D) tablet 1,000 Units (not administered)  vitamin B-12 (CYANOCOBALAMIN) tablet 6,000 mcg (not administered)   divalproex (DEPAKOTE) DR tablet 500-2,000 mg (not administered)  canagliflozin (INVOKANA) tablet 100 mg (not administered)  ferrous sulfate tablet 325 mg (not administered)  gabapentin (NEURONTIN) capsule 300 mg (300 mg Oral Given 05/21/17 0328)  glimepiride (AMARYL) tablet 4 mg (not administered)  haloperidol (HALDOL) tablet 5 mg (not administered)  haloperidol decanoate (HALDOL DECANOATE) 100 MG/ML injection 400 mg (not administered)  lithium carbonate capsule 300 mg (not administered)  losartan (COZAAR) tablet 25 mg (not administered)  montelukast (SINGULAIR) tablet 10 mg (not administered)  multivitamin with minerals tablet 1 tablet (not administered)  nicotine (NICODERM CQ - dosed in mg/24 hours) patch 21 mg (21 mg Transdermal Patch Applied 05/21/17 0327)  pantoprazole (PROTONIX) EC tablet 40 mg (not administered)  predniSONE (DELTASONE) tablet 10 mg (not administered)  traMADol (ULTRAM) tablet 50 mg (50 mg Oral Given 05/21/17 0328)  enoxaparin (LOVENOX) injection 40 mg (40 mg Subcutaneous Given 05/21/17 0329)  acetaminophen (TYLENOL) tablet 650 mg (not administered)    Or  acetaminophen (TYLENOL) suppository 650 mg (not administered)  0.9 %  sodium chloride infusion (75 mL/hr Intravenous New Bag/Given 05/21/17 0328)  insulin aspart (novoLOG) injection 0-9 Units (not administered)  insulin aspart (novoLOG) injection 0-5 Units (0 Units Subcutaneous Not Given 05/21/17 0324)  levofloxacin (LEVAQUIN) IVPB 750 mg (0 mg Intravenous Stopped 05/20/17 2339)  acetaminophen (TYLENOL) tablet 1,000 mg (1,000 mg Oral Given 05/20/17 1614)  sodium chloride 0.9 % bolus 1,000 mL (0 mLs Intravenous Stopped 05/20/17 1734)    And  sodium chloride 0.9 % bolus 1,000 mL (0 mLs Intravenous Stopped 05/20/17 1733)    And  sodium chloride 0.9 % bolus 1,000 mL (0 mLs Intravenous Stopped 05/20/17 1733)    And  sodium chloride 0.9 % bolus 500 mL (0  mLs Intravenous Stopped 05/20/17 1733)  cefTRIAXone (ROCEPHIN) 2 g in dextrose 5 % 50  mL IVPB (0 g Intravenous Stopped 05/20/17 1734)  vancomycin (VANCOCIN) IVPB 1000 mg/200 mL premix (0 mg Intravenous Stopped 05/20/17 2338)  iopamidol (ISOVUE-370) 76 % injection 100 mL (100 mLs Intravenous Contrast Given 05/21/17 0209)    Mobility walks

## 2017-05-21 NOTE — Progress Notes (Signed)
                                           Patient Demographics:    Dylan Jordan, is a 61 y.o. male, DOB - 12/15/1955, MRN:[id number]  Admit date - 05/20/2017   Admitting Physician James Kim, MD  Outpatient Primary MD for the patient is [name], [name], DO  LOS - 1   Chief Complaint  Patient presents with  . Dizziness        Subjective:    Dylan Jordan today has no fevers, no emesis,  No chest pain,  RN at bedside, cooperative, no cough   Assessment  & Plan :    Principal Problem:   Fever Active Problems:   Type 2 diabetes mellitus with diabetic neuropathy (HCC)   Diarrhea   Gout  1)Possible Sepsis-patient was admitted with fever,  tachycardia, lactic acidosis, white count is now up to 15.6, continue Levaquin and vancomycin pending cultures, ESR is only 9.  Influenza negative .  Echocardiogram reassuring.  CT head and CTA chest without acute findings  2)Transient Confusion -on admission now resolved, may be secondary to #1 above, TSH, B12 and sed rate are normal, RPR is pending.  Neuro exam is reassuring low index of suspicion for CNS infection, serum ammonia is not elevated  3)DM- poor-controlled, A1c is 11.7 continue diabetic regimen including Janumet and Amaryl  4)Diarrhea (chronic)- STOP METFORMIN, Gi pathogen panel, C. Diff pending  5)Hypertension- Cont Amlodipine and Losartan   6)Rt Big Toe Gout/Podagra-improved, continue allopurinol, uric acid is not elevated at this time  7)Asthma/COPD-stable, continue bronchodilators  8)Pscyh-psychiatric medications were restarted, patient is cooperative and appropriate at this time  Code Status : Full   Disposition Plan  : Home if cultures negative and patient remains   DVT Prophylaxis  :  Lovenox -   Lab Results  Component Value Date   PLT 241 05/21/2017    Inpatient Medications  Scheduled Meds: . allopurinol  100 mg Oral Daily  . amLODipine  5 mg Oral Daily  .  aspirin EC  81 mg Oral Daily  . atorvastatin  20 mg Oral Daily  . benztropine  2 mg Oral BID  . canagliflozin  100 mg Oral QAC breakfast  . cholecalciferol  1,000 Units Oral Daily  . divalproex  2,000 mg Oral QHS  . divalproex  500 mg Oral Daily  . enoxaparin (LOVENOX) injection  40 mg Subcutaneous QHS  . ferrous sulfate  325 mg Oral Q breakfast  . glimepiride  4 mg Oral BID WC  . haloperidol  5 mg Oral QPM  . [START ON 06/15/2017] haloperidol decanoate  400 mg Intramuscular Q28 days  . insulin aspart  0-5 Units Subcutaneous QHS  . insulin aspart  0-9 Units Subcutaneous TID WC  . lithium carbonate  300 mg Oral BID WC  . loratadine  10 mg Oral Daily  . losartan  25 mg Oral Daily  . montelukast  10 mg Oral QPM  . multivitamin with minerals  1 tablet Oral Daily  . nicotine  21 mg Transdermal Daily  . pantoprazole  40 mg Oral Daily  . predniSONE  10 mg Oral Daily  . vitamin B-12  6,000 mcg Oral Daily   Continuous Infusions: . levofloxacin (LEVAQUIN) IV Stopped (05/20/17 2339)  . vancomycin Stopped (05/21/17 1223)   PRN Meds:.acetaminophen **OR** acetaminophen, gabapentin,   traMADol    Anti-infectives (From admission, onward)   Start     Dose/Rate Route Frequency Ordered Stop   05/21/17 1000  vancomycin (VANCOCIN) IVPB 1000 mg/200 mL premix     1,000 mg 200 mL/hr over 60 Minutes Intravenous Every 12 hours 05/21/17 0519     05/20/17 2200  levofloxacin (LEVAQUIN) IVPB 750 mg     750 mg 100 mL/hr over 90 Minutes Intravenous Daily at bedtime 05/20/17 2138     05/20/17 2145  vancomycin (VANCOCIN) IVPB 1000 mg/200 mL premix     1,000 mg 200 mL/hr over 60 Minutes Intravenous  Once 05/20/17 2138 05/20/17 2338   05/20/17 1545  cefTRIAXone (ROCEPHIN) 2 g in dextrose 5 % 50 mL IVPB     2 g 100 mL/hr over 30 Minutes Intravenous  Once 05/20/17 1543 05/20/17 1734        Objective:   Vitals:   05/21/17 0415 05/21/17 0430 05/21/17 0503 05/21/17 2019  BP: (!) 122/54 (!) 114/58 110/65  112/64  Pulse: 81 80 78 66  Resp: (!) 22 (!) 22 (!) 22   Temp:   98.1 F (36.7 C) 98.2 F (36.8 C)  TempSrc:   Oral Oral  SpO2: 98% 98% 97% 98%  Weight:   101 kg (222 lb 10.6 oz)   Height:   6' 2" (1.88 m)     Wt Readings from Last 3 Encounters:  05/21/17 101 kg (222 lb 10.6 oz)  05/18/17 101.2 kg (223 lb)  05/04/17 103.9 kg (229 lb)     Intake/Output Summary (Last 24 hours) at 05/21/2017 2027 Last data filed at 05/21/2017 1900 Gross per 24 hour  Intake 1330 ml  Output -  Net 1330 ml     Physical Exam  Gen:- Awake Alert,  In no apparent distress  HEENT:- [place].AT, No sclera icterus Neck-Supple Neck,No JVD,.  No rigidity Lungs-  CTAB  CV- S1, S2 normal Abd-  +ve B.Sounds, Abd Soft, No tenderness,    Extremity/Skin:- No  edema,   No rash Neuro-neurological exam is benign and reassuring Psych-appropriate affect, patient is cooperative  Data Review:   Micro Results Recent Results (from the past 240 hour(s))  Blood culture (routine x 2)     Status: None (Preliminary result)   Collection Time: 05/20/17  3:51 PM  Result Value Ref Range Status   Specimen Description BLOOD RIGHT ARM  Final   Special Requests   Final    BOTTLES DRAWN AEROBIC AND ANAEROBIC Blood Culture adequate volume   Culture   Final    NO GROWTH < 24 HOURS Performed at [facility], [address], [place], [place] [address]    Report Status PENDING  Incomplete  Blood culture (routine x 2)     Status: None (Preliminary result)   Collection Time: 05/20/17  3:51 PM  Result Value Ref Range Status   Specimen Description BLOOD LEFT FOREARM  Final   Special Requests   Final    BOTTLES DRAWN AEROBIC AND ANAEROBIC Blood Culture adequate volume   Culture   Final    NO GROWTH < 24 HOURS Performed at [facility], [place] [address], [place], [place] [address]    Report Status PENDING  Incomplete    Radiology Reports Dg Chest 2 View  Result Date: 05/20/2017 CLINICAL DATA:  Fever and dizziness.  EXAM: CHEST  2 VIEW COMPARISON:  02/09/2017 and prior radiographs FINDINGS: The cardiomediastinal silhouette is unremarkable. Peribronchial thickening is unchanged. Unchanged appearance of  the lungs from prior radiographs. There is no evidence of focal airspace disease, pulmonary edema, suspicious pulmonary nodule/mass, pleural effusion, or pneumothorax. No acute bony abnormalities are identified. IMPRESSION: No evidence of acute cardiopulmonary disease. Electronically Signed   By: Margarette Canada M.D.   On: 05/20/2017 16:02   Ct Head Wo Contrast  Result Date: 05/20/2017 CLINICAL DATA:  Confusion, fever, and chills. EXAM: CT HEAD WITHOUT CONTRAST TECHNIQUE: Contiguous axial images were obtained from the base of the skull through the vertex without intravenous contrast. COMPARISON:  None. FINDINGS: Brain: There is no evidence of acute infarct, intracranial hemorrhage, mass, midline shift, or extra-axial fluid collection. Cerebral atrophy is mildly advanced for age. Vascular: Calcified atherosclerosis at the skullbase. No hyperdense vessel. Skull: No fracture or focal osseous lesion. Sinuses/Orbits: Mild right ethmoid air cell opacification at the mild right ethmoid air cell mucosal thickening. Clear mastoid air cells. Unremarkable orbits. Other: None. IMPRESSION: 1. No evidence of acute intracranial abnormality. 2. Mildly age advanced cerebral atrophy. Electronically Signed   By: Logan Bores M.D.   On: 05/20/2017 22:10   Ct Angio Chest Pe W And/or Wo Contrast  Result Date: 05/21/2017 CLINICAL DATA:  62 y/o M; lightheadedness, weakness, dizziness. PE suspected, intermediate probability, positive D-dimer. EXAM: CT ANGIOGRAPHY CHEST WITH CONTRAST TECHNIQUE: Multidetector CT imaging of the chest was performed using the standard protocol during bolus administration of intravenous contrast. Multiplanar CT image reconstructions and MIPs were obtained to evaluate the vascular anatomy. CONTRAST:  100 cc Isovue-300  COMPARISON:  None. FINDINGS: Cardiovascular: Respiratory motion artifact greatest in left lung base. Satisfactory opacification of the pulmonary arteries to the segmental level. No evidence of pulmonary embolism. Normal heart size. No pericardial effusion. Mediastinum/Nodes: No enlarged mediastinal, hilar, or axillary lymph nodes. Thyroid gland, trachea, and esophagus demonstrate no significant findings. Lungs/Pleura: Lungs are clear. No pleural effusion or pneumothorax. Upper Abdomen: No acute abnormality. Musculoskeletal: No chest wall abnormality. No acute or significant osseous findings. Review of the MIP images confirms the above findings. IMPRESSION: 1. Respiratory motion artifact greatest in left lung base. No pulmonary embolus identified. 2. No acute pulmonary process identified. Electronically Signed   By: Kristine Garbe M.D.   On: 05/21/2017 02:56     CBC Recent Labs  Lab 05/20/17 1551 05/21/17 0756  WBC 9.2 15.6*  HGB 12.7* 10.7*  HCT 39.6 33.9*  PLT 255 241  MCV 90.0 91.1  MCH 28.9 28.8  MCHC 32.1 31.6  RDW 14.2 14.5  LYMPHSABS 0.8  --   MONOABS 0.7  --   EOSABS 0.1  --   BASOSABS 0.0  --     Chemistries  Recent Labs  Lab 05/20/17 1551 05/21/17 0756  NA 139 143  K 3.6 4.3  CL 114* 119*  CO2 18* 21*  GLUCOSE 185* 158*  BUN 12 10  CREATININE 1.26* 1.01  CALCIUM 9.6 9.1  AST 16 11*  ALT 12* 11*  ALKPHOS 49 42  BILITOT 0.6 0.8   ------------------------------------------------------------------------------------------------------------------ No results for input(s): CHOL, HDL, LDLCALC, TRIG, CHOLHDL, LDLDIRECT in the last 72 hours.  Lab Results  Component Value Date   HGBA1C 11.7 (H) 05/21/2017   ------------------------------------------------------------------------------------------------------------------ Recent Labs    05/20/17 2115  TSH 1.181    ------------------------------------------------------------------------------------------------------------------ Recent Labs    05/20/17 2115  VITAMINB12 839    Coagulation profile No results for input(s): INR, PROTIME in the last 168 hours.  Recent Labs    05/20/17 2115  DDIMER 0.64*    Cardiac Enzymes Recent Labs  Lab 05/20/17 2115 05/21/17 0148 05/21/17 0756  TROPONINI <0.03 <0.03 <0.03   ------------------------------------------------------------------------------------------------------------------ No results found for: BNP     M.D on 05/21/2017 at 8:27 PM  Between 7am to 7pm - Pager - [phone]  After 7pm go to [email or web address] - password [email or web address]  [facility] -  Office  [phone]   Voice Recognition /Dragon dictation system was used to create this note, attempts have been made to correct errors. Please contact the author with questions and/or clarifications.    

## 2017-05-22 ENCOUNTER — Ambulatory Visit: Payer: Medicaid Other | Admitting: Podiatry

## 2017-05-22 ENCOUNTER — Ambulatory Visit: Payer: Medicaid Other | Admitting: Allergy and Immunology

## 2017-05-22 DIAGNOSIS — A419 Sepsis, unspecified organism: Secondary | ICD-10-CM

## 2017-05-22 DIAGNOSIS — A0472 Enterocolitis due to Clostridium difficile, not specified as recurrent: Secondary | ICD-10-CM | POA: Diagnosis present

## 2017-05-22 LAB — CBC
HCT: 33.3 % — ABNORMAL LOW (ref 39.0–52.0)
Hemoglobin: 10.4 g/dL — ABNORMAL LOW (ref 13.0–17.0)
MCH: 28.5 pg (ref 26.0–34.0)
MCHC: 31.2 g/dL (ref 30.0–36.0)
MCV: 91.2 fL (ref 78.0–100.0)
PLATELETS: 249 10*3/uL (ref 150–400)
RBC: 3.65 MIL/uL — ABNORMAL LOW (ref 4.22–5.81)
RDW: 14.1 % (ref 11.5–15.5)
WBC: 12.1 10*3/uL — ABNORMAL HIGH (ref 4.0–10.5)

## 2017-05-22 LAB — BASIC METABOLIC PANEL
Anion gap: 5 (ref 5–15)
BUN: 12 mg/dL (ref 6–20)
CALCIUM: 9.2 mg/dL (ref 8.9–10.3)
CHLORIDE: 115 mmol/L — AB (ref 101–111)
CO2: 21 mmol/L — ABNORMAL LOW (ref 22–32)
CREATININE: 1.09 mg/dL (ref 0.61–1.24)
Glucose, Bld: 190 mg/dL — ABNORMAL HIGH (ref 65–99)
Potassium: 3.8 mmol/L (ref 3.5–5.1)
SODIUM: 141 mmol/L (ref 135–145)

## 2017-05-22 LAB — GLUCOSE, CAPILLARY
GLUCOSE-CAPILLARY: 222 mg/dL — AB (ref 65–99)
Glucose-Capillary: 179 mg/dL — ABNORMAL HIGH (ref 65–99)
Glucose-Capillary: 335 mg/dL — ABNORMAL HIGH (ref 65–99)
Glucose-Capillary: 123 mg/dL — ABNORMAL HIGH (ref 65–99)
Glucose-Capillary: 346 mg/dL — ABNORMAL HIGH (ref 65–99)

## 2017-05-22 LAB — URINE CULTURE: CULTURE: NO GROWTH

## 2017-05-22 LAB — RPR: RPR Ser Ql: NONREACTIVE

## 2017-05-22 MED ORDER — VANCOMYCIN 50 MG/ML ORAL SOLUTION
125.0000 mg | Freq: Four times a day (QID) | ORAL | Status: DC
  Administered 2017-05-22 – 2017-05-23 (×3): 125 mg via ORAL
  Filled 2017-05-22 (×4): qty 2.5

## 2017-05-22 MED ORDER — INSULIN ASPART 100 UNIT/ML ~~LOC~~ SOLN
3.0000 [IU] | Freq: Three times a day (TID) | SUBCUTANEOUS | Status: DC
  Administered 2017-05-23 (×2): 3 [IU] via SUBCUTANEOUS

## 2017-05-22 NOTE — Progress Notes (Addendum)
Patient Demographics:    Dylan Jordan, is a 62 y.o. male, DOB - 1955/11/01, YBW:[id number]  Admit date - 05/20/2017   Admitting Physician Jani Gravel, MD  Outpatient Primary MD for the patient is [name], DO  LOS - 2   Chief Complaint  Patient presents with  . Dizziness        Subjective:    Dylan Jordan today has no fevers, no emesis,  No chest pain,  RN at bedside, cooperative, no cough , at his request I spoke with his niece via his phone at his bedside  Assessment  & Plan :    Principal Problem:   Fever Active Problems:   Type 2 diabetes mellitus with diabetic neuropathy ([place])   Diarrhea   Gout  Brief summary:- 62 year old male with past medical history relevant for bipolar/affective disorder as well as hypertension diabetes and dyslipidemia and recent treatment for acute podagra who was admitted on 05/20/2016 with confusion, fevers and chills and concerns about possible sepsis. stool studies from 05/18/2017 with positive C. difficile toxin , given fevers, diarrhea, abdominal pain and leukocytosis we should treat empirically with oral Vancocin,    1)Possible Sepsis-patient was admitted with fever,  tachycardia, lactic acidosis, white count is now down to 12 k from 15.6,  STOP Levaquin and vancomycin as etiology maybe  c diff colitis, ESR is only 9.  Influenza negative .  Echocardiogram reassuring.  CT head and CTA chest without acute findings. stool studies from 05/18/2017 with positive C. difficile toxin , given fevers, diarrhea, abdominal pain and leukocytosis we should treat empirically with oral Vancocin,   2)Transient Confusion -on admission now resolved, may be secondary to #1 above, TSH is 1.1, B12 and sed rate are normal, RPR is nonreactive.  Neuro exam is reassuring low index of suspicion for CNS infection, serum ammonia is not elevated  3)DM- poor-controlled, A1c is 11.7,  continue  diabetic regimen including invokanna and Amaryl, discussed with the diabetic educator okay to keep NovoLog 3 times daily with meals  4)Diarrhea (chronic)- STOP METFORMIN, Gi pathogen panel, stool studies from 05/18/2017 with positive C. difficile toxin , given fevers, diarrhea, abdominal pain and leukocytosis we should treat empirically with oral Vancocin,   5)Hypertension- Cont Amlodipine and Losartan  6)Rt Big Toe Gout/Podagra-improved, continue allopurinol, uric acid is not elevated at this time  7)Asthma/COPD-stable, continue bronchodilators  8)Pscyh/bipolar disorder or schizoaffective disorder-psychiatric medications were restarted, patient is cooperative and appropriate at this time  9)Disposition-await final blood cultures and stool studies, possible discharge home in 1-2 days prior and leukocytosis there is no worsening  Code Status : Full   Disposition Plan  : Home if cultures negative and patient remains   DVT Prophylaxis  :  Lovenox -   Lab Results  Component Value Date   PLT 249 05/22/2017    Inpatient Medications  Scheduled Meds: . allopurinol  100 mg Oral Daily  . amLODipine  5 mg Oral Daily  . aspirin EC  81 mg Oral Daily  . atorvastatin  20 mg Oral Daily  . benztropine  2 mg Oral BID  . canagliflozin  100 mg Oral QAC breakfast  . cholecalciferol  1,000 Units Oral Daily  . divalproex  2,000 mg Oral QHS  .  divalproex  500 mg Oral Daily  . enoxaparin (LOVENOX) injection  40 mg Subcutaneous QHS  . ferrous sulfate  325 mg Oral Q breakfast  . glimepiride  4 mg Oral BID WC  . haloperidol  5 mg Oral QPM  . [START ON 06/15/2017] haloperidol decanoate  400 mg Intramuscular Q28 days  . insulin aspart  0-5 Units Subcutaneous QHS  . insulin aspart  0-9 Units Subcutaneous TID WC  . lithium carbonate  300 mg Oral BID WC  . loratadine  10 mg Oral Daily  . losartan  25 mg Oral Daily  . montelukast  10 mg Oral QPM  . multivitamin with minerals  1 tablet Oral Daily  .  nicotine  21 mg Transdermal Daily  . pantoprazole  40 mg Oral Daily  . predniSONE  10 mg Oral Daily  . vitamin B-12  6,000 mcg Oral Daily   Continuous Infusions: . levofloxacin (LEVAQUIN) IV Stopped (05/21/17 2206)  . vancomycin Stopped (05/22/17 1235)   PRN Meds:.acetaminophen **OR** acetaminophen, gabapentin, traMADol    Anti-infectives (From admission, onward)   Start     Dose/Rate Route Frequency Ordered Stop   05/21/17 1000  vancomycin (VANCOCIN) IVPB 1000 mg/200 mL premix     1,000 mg 200 mL/hr over 60 Minutes Intravenous Every 12 hours 05/21/17 0519     05/20/17 2200  levofloxacin (LEVAQUIN) IVPB 750 mg     750 mg 100 mL/hr over 90 Minutes Intravenous Daily at bedtime 05/20/17 2138     05/20/17 2145  vancomycin (VANCOCIN) IVPB 1000 mg/200 mL premix     1,000 mg 200 mL/hr over 60 Minutes Intravenous  Once 05/20/17 2138 05/20/17 2338   05/20/17 1545  cefTRIAXone (ROCEPHIN) 2 g in dextrose 5 % 50 mL IVPB     2 g 100 mL/hr over 30 Minutes Intravenous  Once 05/20/17 1543 05/20/17 1734        Objective:   Vitals:   05/22/17 0429 05/22/17 0500 05/22/17 1606 05/22/17 2031  BP: 132/75  130/70 118/84  Pulse: 72  77 (!) 58  Resp:   18 18  Temp: 98 F (36.7 C)  98.4 F (36.9 C) 98.5 F (36.9 C)  TempSrc: Oral  Oral Oral  SpO2: 98%  98% 100%  Weight:  100.6 kg (221 lb 12.8 oz)    Height:        Wt Readings from Last 3 Encounters:  05/22/17 100.6 kg (221 lb 12.8 oz)  05/18/17 101.2 kg (223 lb)  05/04/17 103.9 kg (229 lb)     Intake/Output Summary (Last 24 hours) at 05/22/2017 2102 Last data filed at 05/22/2017 1821 Gross per 24 hour  Intake 3154 ml  Output -  Net 3154 ml     Physical Exam  Gen:- Awake Alert,  In no apparent distress  HEENT:- [place].AT, No sclera icterus Neck-Supple Neck,No JVD,.  No rigidity Lungs-  CTAB  CV- S1, S2 normal Abd-  +ve B.Sounds, Abd Soft, No significant tenderness,    Extremity/Skin:- No  edema,   No rash Neuro-neurological exam  is benign and reassuring Psych-appropriate affect, patient is cooperative  Data Review:   Micro Results Recent Results (from the past 240 hour(s))  Blood culture (routine x 2)     Status: None (Preliminary result)   Collection Time: 05/20/17  3:51 PM  Result Value Ref Range Status   Specimen Description BLOOD RIGHT ARM  Final   Special Requests   Final    BOTTLES DRAWN AEROBIC AND ANAEROBIC  Blood Culture adequate volume   Culture   Final    NO GROWTH 2 DAYS Performed at [facility], [place] [address], [place], [place] [address]    Report Status PENDING  Incomplete  Blood culture (routine x 2)     Status: None (Preliminary result)   Collection Time: 05/20/17  3:51 PM  Result Value Ref Range Status   Specimen Description BLOOD LEFT FOREARM  Final   Special Requests   Final    BOTTLES DRAWN AEROBIC AND ANAEROBIC Blood Culture adequate volume   Culture   Final    NO GROWTH 2 DAYS Performed at [facility], [place] [address], [place], [place] [address]    Report Status PENDING  Incomplete  Urine culture     Status: None   Collection Time: 05/20/17  6:56 PM  Result Value Ref Range Status   Specimen Description URINE, RANDOM  Final   Special Requests NONE  Final   Culture   Final    NO GROWTH Performed at [facility], [address], [place], [place] [address]    Report Status 05/22/2017 FINAL  Final    Radiology Reports Dg Chest 2 View  Result Date: 05/20/2017 CLINICAL DATA:  Fever and dizziness. EXAM: CHEST  2 VIEW COMPARISON:  02/09/2017 and prior radiographs FINDINGS: The cardiomediastinal silhouette is unremarkable. Peribronchial thickening is unchanged. Unchanged appearance of the lungs from prior radiographs. There is no evidence of focal airspace disease, pulmonary edema, suspicious pulmonary nodule/mass, pleural effusion, or pneumothorax. No acute bony abnormalities are identified. IMPRESSION: No evidence of acute cardiopulmonary disease. Electronically  Signed   By: Margarette Canada M.D.   On: 05/20/2017 16:02   Ct Head Wo Contrast  Result Date: 05/20/2017 CLINICAL DATA:  Confusion, fever, and chills. EXAM: CT HEAD WITHOUT CONTRAST TECHNIQUE: Contiguous axial images were obtained from the base of the skull through the vertex without intravenous contrast. COMPARISON:  None. FINDINGS: Brain: There is no evidence of acute infarct, intracranial hemorrhage, mass, midline shift, or extra-axial fluid collection. Cerebral atrophy is mildly advanced for age. Vascular: Calcified atherosclerosis at the skullbase. No hyperdense vessel. Skull: No fracture or focal osseous lesion. Sinuses/Orbits: Mild right ethmoid air cell opacification at the mild right ethmoid air cell mucosal thickening. Clear mastoid air cells. Unremarkable orbits. Other: None. IMPRESSION: 1. No evidence of acute intracranial abnormality. 2. Mildly age advanced cerebral atrophy. Electronically Signed   By: Logan Bores M.D.   On: 05/20/2017 22:10   Ct Angio Chest Pe W And/or Wo Contrast  Result Date: 05/21/2017 CLINICAL DATA:  62 y/o M; lightheadedness, weakness, dizziness. PE suspected, intermediate probability, positive D-dimer. EXAM: CT ANGIOGRAPHY CHEST WITH CONTRAST TECHNIQUE: Multidetector CT imaging of the chest was performed using the standard protocol during bolus administration of intravenous contrast. Multiplanar CT image reconstructions and MIPs were obtained to evaluate the vascular anatomy. CONTRAST:  100 cc Isovue-300 COMPARISON:  None. FINDINGS: Cardiovascular: Respiratory motion artifact greatest in left lung base. Satisfactory opacification of the pulmonary arteries to the segmental level. No evidence of pulmonary embolism. Normal heart size. No pericardial effusion. Mediastinum/Nodes: No enlarged mediastinal, hilar, or axillary lymph nodes. Thyroid gland, trachea, and esophagus demonstrate no significant findings. Lungs/Pleura: Lungs are clear. No pleural effusion or pneumothorax. Upper  Abdomen: No acute abnormality. Musculoskeletal: No chest wall abnormality. No acute or significant osseous findings. Review of the MIP images confirms the above findings. IMPRESSION: 1. Respiratory motion artifact greatest in left lung base. No pulmonary embolus  identified. 2. No acute pulmonary process identified. Electronically Signed   By: Kristine Garbe M.D.   On: 05/21/2017 02:56     CBC Recent Labs  Lab 05/20/17 1551 05/21/17 0756 05/22/17 0606  WBC 9.2 15.6* 12.1*  HGB 12.7* 10.7* 10.4*  HCT 39.6 33.9* 33.3*  PLT 255 241 249  MCV 90.0 91.1 91.2  MCH 28.9 28.8 28.5  MCHC 32.1 31.6 31.2  RDW 14.2 14.5 14.1  LYMPHSABS 0.8  --   --   MONOABS 0.7  --   --   EOSABS 0.1  --   --   BASOSABS 0.0  --   --     Chemistries  Recent Labs  Lab 05/20/17 1551 05/21/17 0756 05/22/17 0606  NA 139 143 141  K 3.6 4.3 3.8  CL 114* 119* 115*  CO2 18* 21* 21*  GLUCOSE 185* 158* 190*  BUN '12 10 12  ' CREATININE 1.26* 1.01 1.09  CALCIUM 9.6 9.1 9.2  AST 16 11*  --   ALT 12* 11*  --   ALKPHOS 49 42  --   BILITOT 0.6 0.8  --    ------------------------------------------------------------------------------------------------------------------ No results for input(s): CHOL, HDL, LDLCALC, TRIG, CHOLHDL, LDLDIRECT in the last 72 hours.  Lab Results  Component Value Date   HGBA1C 11.7 (H) 05/21/2017   ------------------------------------------------------------------------------------------------------------------ Recent Labs    05/20/17 2115  TSH 1.181   ------------------------------------------------------------------------------------------------------------------ Recent Labs    05/20/17 2115  VITAMINB12 839    Coagulation profile No results for input(s): INR, PROTIME in the last 168 hours.  Recent Labs    05/20/17 2115  DDIMER 0.64*    Cardiac Enzymes Recent Labs  Lab 05/20/17 2115 05/21/17 0148 05/21/17 0756  TROPONINI <0.03 <0.03 <0.03    ------------------------------------------------------------------------------------------------------------------ No results found for: BNP   Roxan Hockey M.D on 05/22/2017 at 9:02 PM  Between 7am to 7pm - Pager - [phone]  After 7pm go to [email or web address] - password [email or web address]  [facility] -  Office  [phone]   Voice Recognition Viviann Spare dictation system was used to create this note, attempts have been made to correct errors. Please contact the author with questions and/or clarifications.

## 2017-05-22 NOTE — Progress Notes (Addendum)
Inpatient Diabetes Program Recommendations  AACE/ADA: New Consensus Statement on Inpatient Glycemic Control (2015)  Target Ranges:  Prepandial:   less than 140 mg/dL      Peak postprandial:   less than 180 mg/dL (1-2 hours)      Critically ill patients:  140 - 180 mg/dL   Lab Results  Component Value Date   GLUCAP 335 (H) 05/22/2017   HGBA1C 11.7 (H) 05/21/2017    Review of Glycemic Control  Blood sugars elevated  Inpatient Diabetes Program Recommendations:     Add Lantus 15 units QHS Add Novolog 5 units tidwc for meal coverage insulin.  Will discuss insulin for home in the am, with elevated HgbA1C of 11.7%.  Continue to follow and titrate insulin.  Thank you. Ailene Ardshonda Kenyotta Dorfman, RD, LDN, CDE Inpatient Diabetes Coordinator [phone]

## 2017-05-23 ENCOUNTER — Ambulatory Visit: Payer: Medicaid Other | Admitting: Podiatry

## 2017-05-23 DIAGNOSIS — A0472 Enterocolitis due to Clostridium difficile, not specified as recurrent: Secondary | ICD-10-CM

## 2017-05-23 DIAGNOSIS — E1140 Type 2 diabetes mellitus with diabetic neuropathy, unspecified: Secondary | ICD-10-CM

## 2017-05-23 DIAGNOSIS — M109 Gout, unspecified: Secondary | ICD-10-CM

## 2017-05-23 LAB — CBC
HEMATOCRIT: 34.2 % — AB (ref 39.0–52.0)
HEMOGLOBIN: 10.9 g/dL — AB (ref 13.0–17.0)
MCH: 29 pg (ref 26.0–34.0)
MCHC: 31.9 g/dL (ref 30.0–36.0)
MCV: 91 fL (ref 78.0–100.0)
Platelets: 295 10*3/uL (ref 150–400)
RBC: 3.76 MIL/uL — ABNORMAL LOW (ref 4.22–5.81)
RDW: 14.2 % (ref 11.5–15.5)
WBC: 9.9 10*3/uL (ref 4.0–10.5)

## 2017-05-23 LAB — BASIC METABOLIC PANEL
Anion gap: 7 (ref 5–15)
BUN: 13 mg/dL (ref 6–20)
CHLORIDE: 110 mmol/L (ref 101–111)
CO2: 23 mmol/L (ref 22–32)
CREATININE: 1.17 mg/dL (ref 0.61–1.24)
Calcium: 9.5 mg/dL (ref 8.9–10.3)
GFR calc Af Amer: >60 mL/min (ref >60)
GFR calc non Af Amer: >60 mL/min (ref >60)
Glucose, Bld: 174 mg/dL — ABNORMAL HIGH (ref 65–99)
Potassium: 3.5 mmol/L (ref 3.5–5.1)
Sodium: 140 mmol/L (ref 135–145)

## 2017-05-23 LAB — GLUCOSE, CAPILLARY
Glucose-Capillary: 116 mg/dL — ABNORMAL HIGH (ref 65–99)
Glucose-Capillary: 241 mg/dL — ABNORMAL HIGH (ref 65–99)

## 2017-05-23 MED ORDER — VANCOMYCIN 50 MG/ML ORAL SOLUTION: 125.0000 mg | Freq: Four times a day (QID) | ORAL | 0 refills | Status: DC

## 2017-05-23 MED FILL — VANCOMYCIN 50MG/ML ORAL SOL: 50MG/1ML | 10 days supply | Qty: 100 | Fill #0

## 2017-05-23 NOTE — Discharge Summary (Signed)
Physician Discharge Summary  Dylan Jordan QQP:[id number] DOB: 05-Jan-1956 DOA: 05/20/2017  PCP: [name], DO  Admit date: 05/20/2017 Discharge date: 05/23/2017  Admitted From: Home Disposition: Home  Recommendations for Outpatient Follow-up:  1. Follow up with PCP in 1-2 weeks regarding C. Diff colitis and diabetes 2. Consider referral to orthopedic surgery or urology at the discretion of PCP  Home Health: None Equipment/Devices: None  Discharge Condition:  Stable, improved CODE STATUS: Full code Diet recommendation: Diabetic diet  Brief/Interim Summary:  The patient is a 62 year old male with history of bipolar affective disorder, hypertension, hyperlipidemia, diabetes mellitus type 2, gout who presented with fever, chills, increased confusion and intermittent diarrhea.  He was initially started on broad-spectrum antibiotics empirically for sepsis of unknown etiology.  His chest x-ray was negative and urinalysis was unremarkable.  He was found to be positive for C. difficile toxin and was started on oral vancomycin.  His vancomycin and levofloxacin were discontinued.  His white blood cell count trended down.  His confusion resolved.  His fevers resolved.  His diarrhea became slightly more formed and less frequent and he felt ready for discharge on 1/9.  Discharge Diagnoses:  Principal Problem:   C. difficile diarrhea Active Problems:   Type 2 diabetes mellitus with diabetic neuropathy (HCC)   Diarrhea   Gout   Fever  Sepsis due to C. Diff colitis -  ESR is 9 -  Influenza negative -  Stool studies from 05/18/2017 with positive C. difficile toxin -  Continue oral vancomycin x 10 days   Acute metabolic encephalopathy secondary to sepsis, resolved with treatment of his underlying infection.  TSH, vitamin B12, sed rate, RPR, ammonia level were all normal.  CT of the head was unremarkable.  Diabetes mellitus type 2- poor-controlled, A1c is 11.7 -  continued invokanna and Amaryl  but stopped metformin in the setting of diarrhea -Ongoing management by PCP  Sinus tachycardia, secondary to sepsis.  TSH within normal limits and CT angiogram chest negative for PE  Hypertension, blood pressure stable, continued Amlodipine and Losartan  Rt Big Toe Gout/Podagra-improved, continued allopurinol.  Patient was also recently prescribed indomethacin and a prednisone taper which were continued.  Asthma/COPD-stable, continued bronchodilators  Pscyh/bipolar disorder or schizoaffective disorder-psychiatric medications were continued, patient remained cooperative  Discharge Instructions  Discharge Instructions    Call MD for:  extreme fatigue   Complete by:  As directed    Call MD for:  persistant dizziness or light-headedness   Complete by:  As directed    Call MD for:  persistant nausea and vomiting   Complete by:  As directed    Call MD for:  severe uncontrolled pain   Complete by:  As directed    Call MD for:  temperature >100.4   Complete by:  As directed    Diet Carb Modified   Complete by:  As directed    Increase activity slowly   Complete by:  As directed      Allergies as of 05/23/2017      Reactions   Ativan [lorazepam] Other (See Comments)   Agitation (and "5 others like it") Will need to call [facility]. [facility] in [place] (phone) [phone] and [phone] to fax a release-   Penicillins Diarrhea   Has patient had a PCN reaction causing immediate rash, facial/tongue/throat swelling, SOB or lightheadedness with hypotension: No Has patient had a PCN reaction causing severe rash involving mucus membranes or skin necrosis: No Has patient had a PCN  reaction that required hospitalization: No Has patient had a PCN reaction occurring within the last 10 years: No If all of the above answers are "NO", then may proceed with Cephalosporin use.      Medication List    STOP taking these medications   cephALEXin 500 MG capsule Commonly known as:  KEFLEX    diclofenac 75 MG EC tablet Commonly known as:  VOLTAREN   HYDROcodone-acetaminophen 5-325 MG tablet Commonly known as:  NORCO   ibuprofen 200 MG tablet Commonly known as:  ADVIL,MOTRIN   metFORMIN 500 MG tablet Commonly known as:  GLUCOPHAGE     TAKE these medications   acetaminophen 650 MG CR tablet Commonly known as:  TYLENOL Take 650 mg by mouth 2 (two) times daily as needed for pain.   allopurinol 100 MG tablet Commonly known as:  ZYLOPRIM Take 1 tablet (100 mg total) by mouth daily.   amLODipine 5 MG tablet Commonly known as:  NORVASC Take 1 tablet (5 mg total) by mouth daily.   ammonium lactate 12 % cream Commonly known as:  AMLACTIN Apply topically as needed for dry skin. What changed:  how much to take   aspirin EC 81 MG tablet Take 1 tablet (81 mg total) by mouth daily.   atorvastatin 20 MG tablet Commonly known as:  LIPITOR take 1 TABLET BY MOUTH EVERY DAY   benztropine 2 MG tablet Commonly known as:  COGENTIN Take 2 mg by mouth 2 (two) times daily.   cetirizine 10 MG tablet Commonly known as:  ZYRTEC Take 1 tablet (10 mg total) by mouth daily.   cholecalciferol 1000 units tablet Commonly known as:  VITAMIN D Take 1,000 Units by mouth daily.   DAILY-VITE Tabs TAKE 1 TABLET BY MOUTH DAILY.   diclofenac sodium 1 % Gel Commonly known as:  VOLTAREN Apply 4 g topically 4 (four) times daily.   divalproex 500 MG DR tablet Commonly known as:  DEPAKOTE Take 500-2,000 mg by mouth See admin instructions. Take 553m every morning and 2000 tabs every evening   empagliflozin 10 MG Tabs tablet Commonly known as:  JARDIANCE Take 10 mg by mouth daily.   ferrous sulfate 325 (65 FE) MG tablet Commonly known as:  FERROUSUL Take 1 tablet (325 mg total) by mouth daily with breakfast.   fluticasone 50 MCG/ACT nasal spray Commonly known as:  FLONASE Place 2 sprays into both nostrils daily. What changed:    when to take this  reasons to take this    Fluticasone-Salmeterol 250-50 MCG/DOSE Aepb Commonly known as:  ADVAIR DISKUS Inhale 1 puff into the lungs 2 (two) times daily.   gabapentin 300 MG capsule Commonly known as:  NEURONTIN Take 1 capsule (300 mg total) by mouth 3 (three) times daily as needed.   glimepiride 4 MG tablet Commonly known as:  AMARYL Take 1 tablet (4 mg total) by mouth 2 (two) times daily.   haloperidol 10 MG tablet Commonly known as:  HALDOL Take 5 mg by mouth every evening.   haloperidol decanoate 100 MG/ML injection Commonly known as:  HALDOL DECANOATE Inject 400 mg into the muscle every 28 (twenty-eight) days.   indomethacin 50 MG capsule Commonly known as:  INDOCIN Take 50 mg by mouth 2 (two) times daily with a meal.   lithium carbonate 300 MG capsule Take 300 mg by mouth 2 (two) times daily with a meal.   losartan 25 MG tablet Commonly known as:  COZAAR TAKE 1 TABLET BY MOUTH EVERY DAY  What changed:    how much to take  how to take this  when to take this   montelukast 10 MG tablet Commonly known as:  SINGULAIR take 1 TABLET BY MOUTH EVERY DAY What changed:    how much to take  how to take this  when to take this   nicotine 21 mg/24hr patch Commonly known as:  NICODERM CQ - dosed in mg/24 hours Place 1 patch (21 mg total) onto the skin daily.   omeprazole 20 MG capsule Commonly known as:  PRILOSEC Take 2 capsules (40 mg total) by mouth daily. What changed:  when to take this   phenylephrine-shark liver oil-mineral oil-petrolatum 0.25-14-74.9 % rectal ointment Commonly known as:  PREPARATION H Place 1 application rectally 2 (two) times daily as needed for hemorrhoids.   predniSONE 10 MG (21) Tbpk tablet Commonly known as:  STERAPRED UNI-PAK 21 TAB Take by mouth daily. Take 6 tabs by mouth daily  for 2 days, then 5 tabs for 2 days, then 4 tabs for 2 days, then 3 tabs for 2 days, 2 tabs for 2 days, then 1 tab by mouth daily for 2 days   traMADol 50 MG tablet Commonly  known as:  ULTRAM Take 1 tablet (50 mg total) by mouth every 6 (six) hours as needed.   vancomycin 50 mg/mL oral solution Commonly known as:  VANCOCIN Take 2.5 mLs (125 mg total) by mouth every 6 (six) hours.   Vitamin B-12 6000 MCG Subl Place 6,000 mcg under the tongue daily.      Follow-up Information    [name], DO. Schedule an appointment as soon as possible for a visit in 1 week(s).   Specialty:  Family Medicine Contact information: [address] [place] [address] [phone]          Allergies  Allergen Reactions  . Ativan [Lorazepam] Other (See Comments)    Agitation (and "5 others like it") Will need to call [facility]. [facility] in [place] (phone) [phone] and [phone] to fax a release-   . Penicillins Diarrhea    Has patient had a PCN reaction causing immediate rash, facial/tongue/throat swelling, SOB or lightheadedness with hypotension: No Has patient had a PCN reaction causing severe rash involving mucus membranes or skin necrosis: No Has patient had a PCN reaction that required hospitalization: No Has patient had a PCN reaction occurring within the last 10 years: No If all of the above answers are "NO", then may proceed with Cephalosporin use.     Consultations: None   Procedures/Studies: Dg Chest 2 View  Result Date: 05/20/2017 CLINICAL DATA:  Fever and dizziness. EXAM: CHEST  2 VIEW COMPARISON:  02/09/2017 and prior radiographs FINDINGS: The cardiomediastinal silhouette is unremarkable. Peribronchial thickening is unchanged. Unchanged appearance of the lungs from prior radiographs. There is no evidence of focal airspace disease, pulmonary edema, suspicious pulmonary nodule/mass, pleural effusion, or pneumothorax. No acute bony abnormalities are identified. IMPRESSION: No evidence of acute cardiopulmonary disease. Electronically Signed   By: Margarette Canada M.D.   On: 05/20/2017 16:02   Ct Head Wo Contrast  Result Date:  05/20/2017 CLINICAL DATA:  Confusion, fever, and chills. EXAM: CT HEAD WITHOUT CONTRAST TECHNIQUE: Contiguous axial images were obtained from the base of the skull through the vertex without intravenous contrast. COMPARISON:  None. FINDINGS: Brain: There is no evidence of acute infarct, intracranial hemorrhage, mass, midline shift, or extra-axial fluid collection. Cerebral atrophy is mildly advanced for age. Vascular: Calcified atherosclerosis  at the skullbase. No hyperdense vessel. Skull: No fracture or focal osseous lesion. Sinuses/Orbits: Mild right ethmoid air cell opacification at the mild right ethmoid air cell mucosal thickening. Clear mastoid air cells. Unremarkable orbits. Other: None. IMPRESSION: 1. No evidence of acute intracranial abnormality. 2. Mildly age advanced cerebral atrophy. Electronically Signed   By: Logan Bores M.D.   On: 05/20/2017 22:10   Ct Angio Chest Pe W And/or Wo Contrast  Result Date: 05/21/2017 CLINICAL DATA:  62 y/o M; lightheadedness, weakness, dizziness. PE suspected, intermediate probability, positive D-dimer. EXAM: CT ANGIOGRAPHY CHEST WITH CONTRAST TECHNIQUE: Multidetector CT imaging of the chest was performed using the standard protocol during bolus administration of intravenous contrast. Multiplanar CT image reconstructions and MIPs were obtained to evaluate the vascular anatomy. CONTRAST:  100 cc Isovue-300 COMPARISON:  None. FINDINGS: Cardiovascular: Respiratory motion artifact greatest in left lung base. Satisfactory opacification of the pulmonary arteries to the segmental level. No evidence of pulmonary embolism. Normal heart size. No pericardial effusion. Mediastinum/Nodes: No enlarged mediastinal, hilar, or axillary lymph nodes. Thyroid gland, trachea, and esophagus demonstrate no significant findings. Lungs/Pleura: Lungs are clear. No pleural effusion or pneumothorax. Upper Abdomen: No acute abnormality. Musculoskeletal: No chest wall abnormality. No acute or  significant osseous findings. Review of the MIP images confirms the above findings. IMPRESSION: 1. Respiratory motion artifact greatest in left lung base. No pulmonary embolus identified. 2. No acute pulmonary process identified. Electronically Signed   By: Kristine Garbe M.D.   On: 05/21/2017 02:56       Subjective: He is having less frequent and slightly thicker consistency stools.  He denies any recent fevers or chills.  He denies nausea and has been tolerating his diet.  He complains of some right knee pain which he feels is similar to when he had a meniscal injury to his left knee.  He also continues to have some very mild pain in his right first MTP joint.  Discharge Exam: Vitals:   05/22/17 2031 05/23/17 0506  BP: 118/84 117/71  Pulse: (!) 58 63  Resp: 18 18  Temp: 98.5 F (36.9 C) 98.2 F (36.8 C)  SpO2: 100% 98%   Vitals:   05/22/17 0500 05/22/17 1606 05/22/17 2031 05/23/17 0506  BP:  130/70 118/84 117/71  Pulse:  77 (!) 58 63  Resp:  '18 18 18  ' Temp:  98.4 F (36.9 C) 98.5 F (36.9 C) 98.2 F (36.8 C)  TempSrc:  Oral Oral Oral  SpO2:  98% 100% 98%  Weight: 100.6 kg (221 lb 12.8 oz)   100.6 kg (221 lb 12.5 oz)  Height:        General: Pt is alert, awake, not in acute distress Cardiovascular: RRR, S1/S2 +, no rubs, no gallops Respiratory: CTA bilaterally, no wheezing, no rhonchi Abdominal: Soft, NT, ND, bowel sounds + Extremities: no edema, no cyanosis.  Some crepitus of the right knee, but no obvious effusion, warmth, erythema.  His right first MTP is mildly erythematous but not warm to the touch.  He has minimal pain with palpation.    The results of significant diagnostics from this hospitalization (including imaging, microbiology, ancillary and laboratory) are listed below for reference.     Microbiology: Recent Results (from the past 240 hour(s))  Blood culture (routine x 2)     Status: None (Preliminary result)   Collection Time: 05/20/17  3:51  PM  Result Value Ref Range Status   Specimen Description BLOOD RIGHT ARM  Final   Special Requests  Final    BOTTLES DRAWN AEROBIC AND ANAEROBIC Blood Culture adequate volume   Culture   Final    NO GROWTH 3 DAYS Performed at [facility], [place] [address], [place], [place] [address]    Report Status PENDING  Incomplete  Blood culture (routine x 2)     Status: None (Preliminary result)   Collection Time: 05/20/17  3:51 PM  Result Value Ref Range Status   Specimen Description BLOOD LEFT FOREARM  Final   Special Requests   Final    BOTTLES DRAWN AEROBIC AND ANAEROBIC Blood Culture adequate volume   Culture   Final    NO GROWTH 3 DAYS Performed at [facility], [place] [address] [place], [place] [address]    Report Status PENDING  Incomplete  Urine culture     Status: None   Collection Time: 05/20/17  6:56 PM  Result Value Ref Range Status   Specimen Description URINE, RANDOM  Final   Special Requests NONE  Final   Culture   Final    NO GROWTH Performed at [facility], [address], [place], [place] [address]    Report Status 05/22/2017 FINAL  Final     Labs: BNP (last 3 results) No results for input(s): BNP in the last 8760 hours. Basic Metabolic Panel: Recent Labs  Lab 05/20/17 1551 05/21/17 0756 05/22/17 0606 05/23/17 0519  NA 139 143 141 140  K 3.6 4.3 3.8 3.5  CL 114* 119* 115* 110  CO2 18* 21* 21* 23  GLUCOSE 185* 158* 190* 174*  BUN '12 10 12 13  ' CREATININE 1.26* 1.01 1.09 1.17  CALCIUM 9.6 9.1 9.2 9.5   Liver Function Tests: Recent Labs  Lab 05/20/17 1551 05/21/17 0756  AST 16 11*  ALT 12* 11*  ALKPHOS 49 42  BILITOT 0.6 0.8  PROT 7.7 6.0*  ALBUMIN 3.7 2.8*   No results for input(s): LIPASE, AMYLASE in the last 168 hours. Recent Labs  Lab 05/21/17 0006  AMMONIA 24   CBC: Recent Labs  Lab 05/20/17 1551 05/21/17 0756 05/22/17 0606 05/23/17 0519  WBC 9.2 15.6* 12.1* 9.9  NEUTROABS 7.6  --   --   --   HGB 12.7* 10.7*  10.4* 10.9*  HCT 39.6 33.9* 33.3* 34.2*  MCV 90.0 91.1 91.2 91.0  PLT 255 241 249 295   Cardiac Enzymes: Recent Labs  Lab 05/20/17 2115 05/21/17 0148 05/21/17 0756  TROPONINI <0.03 <0.03 <0.03   BNP: Invalid input(s): POCBNP CBG: Recent Labs  Lab 05/22/17 1225 05/22/17 1647 05/22/17 2226 05/23/17 0719 05/23/17 1127  GLUCAP 335* 123* 346* 116* 241*   D-Dimer Recent Labs    05/20/17 2115  DDIMER 0.64*   Hgb A1c Recent Labs    05/21/17 0148  HGBA1C 11.7*   Lipid Profile No results for input(s): CHOL, HDL, LDLCALC, TRIG, CHOLHDL, LDLDIRECT in the last 72 hours. Thyroid function studies Recent Labs    05/20/17 2115  TSH 1.181   Anemia work up Recent Labs    05/20/17 2115  VITAMINB12 839   Urinalysis    Component Value Date/Time   COLORURINE YELLOW 05/20/2017 1856   APPEARANCEUR CLEAR 05/20/2017 1856   LABSPEC 1.006 05/20/2017 1856   PHURINE 5.0 05/20/2017 1856   GLUCOSEU 150 (A) 05/20/2017 1856   HGBUR NEGATIVE 05/20/2017 1856   BILIRUBINUR NEGATIVE 05/20/2017 [place] NEGATIVE 05/20/2017 1856   PROTEINUR 30 (A) 05/20/2017 1856   NITRITE NEGATIVE 05/20/2017 1856  LEUKOCYTESUR NEGATIVE 05/20/2017 1856   Sepsis Labs Invalid input(s): PROCALCITONIN,  WBC,  LACTICIDVEN   Time coordinating discharge: Over 30 minutes  SIGNED:   Janece Canterbury, MD  [facility] 05/23/2017, 1:51 PM Pager   If 7PM-7AM, please contact night-coverage [email or web address] Password [email or web address]

## 2017-05-23 NOTE — Care Management Note (Signed)
Case Management Note  Patient Details  Name: Dylan Jordan MRN: [id number] Date of Birth: [birth year]  Subjective/Objective: 62 y/o m admitted w/fever. From home.                   Action/Plan:d/c home.   Expected Discharge Date:  05/23/17               Expected Discharge Plan:  Home/Self Care  In-House Referral:     Discharge planning Services  CM Consult  Post Acute Care Choice:    Choice offered to:     DME Arranged:    DME Agency:     HH Arranged:    HH Agency:     Status of Service:  Completed, signed off  If discussed at [facility] Length of Stay Meetings, dates discussed:    Additional Comments:  Lanier ClamMahabir, Dylan Badolato, RN 05/23/2017, 10:15 AM

## 2017-05-24 ENCOUNTER — Telehealth: Payer: Self-pay | Admitting: *Deleted

## 2017-05-24 NOTE — Telephone Encounter (Signed)
Patient left message on nurse line requesting return call from PCP to discuss meds following hospital discharge. Kinnie FeilL. Mirah Nevins, RN, BSN

## 2017-05-25 LAB — CULTURE, BLOOD (ROUTINE X 2)
CULTURE: NO GROWTH
Culture: NO GROWTH
SPECIAL REQUESTS: ADEQUATE
Special Requests: ADEQUATE

## 2017-05-25 NOTE — Telephone Encounter (Signed)
Pt was taken off metformin while he was at the hospital and he is wanting to know if he will be okay off of it. [facility] and they told him he didn't need to be on metformin. Please give him a call to discuss this.

## 2017-05-25 NOTE — Telephone Encounter (Signed)
Will forward to MD to advise. Semaj Kham,CMA  

## 2017-05-28 NOTE — Telephone Encounter (Signed)
Returned call to [name]. His diarrhea has improved with taking oral vancomycin. He feels he should still be on the metformin. I explained it was stopped due to the diarrhea. He would prefer to restart. I asked him to let me know if the diarrhea worsens once he restarts metformin. He is still drinking soda and sugary beverages. Implored him to discontinue this and drink only water or seltzer water for flavor. He will see me on 06/07/17.   [name], DO PGY-2, [facility] 05/28/2017 2:55 PM

## 2017-05-28 NOTE — Telephone Encounter (Signed)
Will forward to MD. Edman Lipsey,CMA  

## 2017-05-28 NOTE — Telephone Encounter (Signed)
Pt calling back about this because nobody has called him to tell him if he needs to take metformin or not. I read the phone message off to him from [employer] and he said his sugars have been reading high when he checks it or in the 300's. He feels that's too high for him to stop the metformin. Please contact patient and discuss whether he should stop the medication or not

## 2017-05-28 NOTE — Telephone Encounter (Signed)
From chart review it looks like metformin was stopped due to diarrhea. He is on other diabetes medications that should manage his sugars well enough, depending on how much soda he is drinking.

## 2017-05-29 ENCOUNTER — Other Ambulatory Visit: Payer: Self-pay | Admitting: Family Medicine

## 2017-05-29 ENCOUNTER — Telehealth: Payer: Self-pay | Admitting: *Deleted

## 2017-05-29 DIAGNOSIS — M109 Gout, unspecified: Secondary | ICD-10-CM

## 2017-05-29 MED ORDER — PREDNISONE 10 MG PO TABS: 20.0000 mg | ORAL_TABLET | Freq: Every day | ORAL | 0 refills | Status: DC

## 2017-05-29 NOTE — Telephone Encounter (Signed)
Message left on clinic nurse voice mail - Returned call to patient.  He is questioning his prednisone dosage due to he only has 2 tablets left from his taper dosage.  Was prescribed prednisone dosepak #42 tablets to take over course of 12 days.  When patient questioned about how he has been taking med, he stated that he has been taking med BID instead of once a day.  Discussed with Dr. Leveda AnnaHensel (preceptor) - will have patient take prednisone (2 tablets every morning) x 4 days.  Rx sent to pharmacy for additional days.  Discussed dosage with patient and he was able to relay back to me that he is only to take 2 tablets every morning and none in the afternoon for tomorrow, Thursday, Friday, and finish on Saturday.  Patient will call back if he has additional questions or any problems with med. Dylan Jordan~Dylan Jordan, BSN, RN-BC

## 2017-05-30 NOTE — Telephone Encounter (Signed)
Noted and agree. 

## 2017-06-04 ENCOUNTER — Other Ambulatory Visit: Payer: Self-pay | Admitting: Family Medicine

## 2017-06-07 ENCOUNTER — Encounter: Payer: Self-pay | Admitting: Family Medicine

## 2017-06-07 ENCOUNTER — Other Ambulatory Visit: Payer: Self-pay

## 2017-06-07 ENCOUNTER — Ambulatory Visit: Payer: Medicaid Other | Admitting: Family Medicine

## 2017-06-07 ENCOUNTER — Other Ambulatory Visit: Payer: Self-pay | Admitting: Internal Medicine

## 2017-06-07 VITALS — BP 110/70 | HR 90 | Temp 98.2°F | Ht 74.0 in | Wt 213.0 lb

## 2017-06-07 DIAGNOSIS — K649 Unspecified hemorrhoids: Secondary | ICD-10-CM | POA: Diagnosis not present

## 2017-06-07 DIAGNOSIS — N189 Chronic kidney disease, unspecified: Secondary | ICD-10-CM

## 2017-06-07 DIAGNOSIS — A0472 Enterocolitis due to Clostridium difficile, not specified as recurrent: Secondary | ICD-10-CM | POA: Diagnosis not present

## 2017-06-07 DIAGNOSIS — E1140 Type 2 diabetes mellitus with diabetic neuropathy, unspecified: Secondary | ICD-10-CM

## 2017-06-07 MED ORDER — VITAMIN D 1000 UNITS PO TABS: 1000.0000 [IU] | ORAL_TABLET | Freq: Every day | ORAL | 3 refills | Status: DC

## 2017-06-07 MED ORDER — ASPIRIN EC 81 MG PO TBEC: 81.0000 mg | DELAYED_RELEASE_TABLET | Freq: Every day | ORAL | 3 refills | Status: DC

## 2017-06-07 NOTE — Assessment & Plan Note (Signed)
  Encouraged patient to pick up prep H cream from pharmacy, he states will try to do so.

## 2017-06-07 NOTE — Assessment & Plan Note (Signed)
  Chronic, uncontrolled. Patient taking jardiance, glimepiride, metformin. He was advised to stop metformin due to diarrhea but felt more comfortable taking this.  -counseled extensively on what foods to eat and what to avoid, handout given to patient -refilled jardiance -continue current regimen -follow up 1 month to see how diet is going -check CMP today

## 2017-06-07 NOTE — Assessment & Plan Note (Signed)
  Improved with oral vanc. 2 doses left.  -complete course, follow up if diarrhea persists

## 2017-06-07 NOTE — Progress Notes (Signed)
    Subjective:    Patient ID: Dylan Jordan, male    DOB: 1955/11/18, 62 y.o.   MRN: [id number]   CC: follow up DM  DM- not going well. He has been on steroids for gout, finished taper, but sugars had been high. They are improving some. He is eating mostly soups. Canned soup with pastas or potatoes in them.  Increased urination volume and frequency. He is worried about his kidneys because of this. He does report losing 10 pounds since he started limiting his diet but states he does not know what foods to eat.   Diarrhea- has improved since taking vanc. Goes "at least 3 times a day". Max is 5. Has 2 more doses of oral vanc. Stool is pasty and hard to get out. It's gotten a little better.  Upset about medications in general and what he should be taking. He brought in all his medications for review.  Smoking status reviewed- non-smoker  Review of Systems- endorses increased hunger, increased thirst, increased urination, blurred vision.   Objective:  BP 110/70 (BP Location: Left Arm, Patient Position: Sitting, Cuff Size: Normal)   Pulse 90   Temp 98.2 F (36.8 C) (Oral)   Ht 6\' 2"  (1.88 m)   Wt 213 lb (96.6 kg)   SpO2 98%   BMI 27.35 kg/m  Vitals and nursing note reviewed  General: well nourished, in no acute distress Cardiac: RRR, clear S1 and S2, no murmurs, rubs, or gallops Respiratory: clear to auscultation bilaterally, no increased work of breathing Abdomen: soft, nontender, nondistended, no masses or organomegaly. Bowel sounds present Skin: warm and dry, no rashes noted Neuro: alert and oriented, no focal deficits  Assessment & Plan:    Type 2 diabetes mellitus with diabetic neuropathy (HCC)  Chronic, uncontrolled. Patient taking jardiance, glimepiride, metformin. He was advised to stop metformin due to diarrhea but felt more comfortable taking this.  -counseled extensively on what foods to eat and what to avoid, handout given to patient -refilled jardiance -continue  current regimen -follow up 1 month to see how diet is going -check CMP today  C. difficile diarrhea  Improved with oral vanc. 2 doses left.  -complete course, follow up if diarrhea persists  Hemorrhoids  Encouraged patient to pick up prep H cream from pharmacy, he states will try to do so.   Return in about 4 weeks (around 07/05/2017).   Dolores PattyAngela Melonee Gerstel, DO Family Medicine Resident PGY-2

## 2017-06-07 NOTE — Patient Instructions (Addendum)
Fill Jardiance at your pharmacy.  I sent in gabapentin and aspirin to your pharmacy.  If you have questions or concerns please do not hesitate to call at [phone].  Dolores PattyAngela Jowan Skillin, DO PGY-2, [facility] 06/07/2017 2:15 PM   Avoid pastas, rices, potatoes. Avoid desserts and sodas.  Eat a lot of proteins- fish, Mal[place]urkey, chicken, eggs, beans, nuts.  Eat a lot of vegetables- broccoli, string beans, peas, cauliflower, peppers, mushrooms  Avoid fruits- watermelon, bananas, oranges, grapes, apples   Diabetes Mellitus and Nutrition When you have diabetes (diabetes mellitus), it is very important to have healthy eating habits because your blood sugar (glucose) levels are greatly affected by what you eat and drink. Eating healthy foods in the appropriate amounts, at about the same times every day, can help you:  Control your blood glucose.  Lower your risk of heart disease.  Improve your blood pressure.  Reach or maintain a healthy weight.  Every person with diabetes is different, and each person has different needs for a meal plan. Your health care provider may recommend that you work with a diet and nutrition specialist (dietitian) to make a meal plan that is best for you. Your meal plan may vary depending on factors such as:  The calories you need.  The medicines you take.  Your weight.  Your blood glucose, blood pressure, and cholesterol levels.  Your activity level.  Other health conditions you have, such as heart or kidney disease.  How do carbohydrates affect me? Carbohydrates affect your blood glucose level more than any other type of food. Eating carbohydrates naturally increases the amount of glucose in your blood. Carbohydrate counting is a method for keeping track of how many carbohydrates you eat. Counting carbohydrates is important to keep your blood glucose at a healthy level, especially if you use insulin or take certain oral diabetes  medicines. It is important to know how many carbohydrates you can safely have in each meal. This is different for every person. Your dietitian can help you calculate how many carbohydrates you should have at each meal and for snack. Foods that contain carbohydrates include:  Bread, cereal, rice, pasta, and crackers.  Potatoes and corn.  Peas, beans, and lentils.  Milk and yogurt.  Fruit and juice.  Desserts, such as cakes, cookies, ice cream, and candy.   What are tips for following this plan? Reading food labels  Start by checking the serving size on the label. The amount of calories, carbohydrates, fats, and other nutrients listed on the label are based on one serving of the food. Many foods contain more than one serving per package.  Check the total grams (g) of carbohydrates in one serving. You can calculate the number of servings of carbohydrates in one serving by dividing the total carbohydrates by 15. For example, if a food has 30 g of total carbohydrates, it would be equal to 2 servings of carbohydrates.  Check the number of grams (g) of saturated and trans fats in one serving. Choose foods that have low or no amount of these fats.  Check the number of milligrams (mg) of sodium in one serving. Most people should limit total sodium intake to less than 2,300 mg per day.  Always check the nutrition information of foods labeled as "low-fat" or "nonfat". These foods may be higher in added sugar or refined carbohydrates and should be avoided.  Talk to your dietitian to identify your daily goals for nutrients listed on the label. Shopping  Avoid buying canned, premade, or processed foods. These foods tend to be high in fat, sodium, and added sugar.  Shop around the outside edge of the grocery store. This includes fresh fruits and vegetables, bulk grains, fresh meats, and fresh dairy. Cooking  Use low-heat cooking methods, such as baking, instead of high-heat cooking methods like  deep frying.  Cook using healthy oils, such as olive, canola, or sunflower oil.  Avoid cooking with butter, cream, or high-fat meats. Meal planning  Eat meals and snacks regularly, preferably at the same times every day. Avoid going long periods of time without eating.  Eat foods high in fiber, such as fresh fruits, vegetables, beans, and whole grains. Talk to your dietitian about how many servings of carbohydrates you can eat at each meal.  Eat 4-6 ounces of lean protein each day, such as lean meat, chicken, fish, eggs, or tofu. 1 ounce is equal to 1 ounce of meat, chicken, or fish, 1 egg, or 1/4 cup of tofu.  Eat some foods each day that contain healthy fats, such as avocado, nuts, seeds, and fish. Lifestyle   Check your blood glucose regularly.  Exercise at least 30 minutes 5 or more days each week, or as told by your health care provider.  Take medicines as told by your health care provider.  Do not use any products that contain nicotine or tobacco, such as cigarettes and e-cigarettes. If you need help quitting, ask your health care provider.  Work with a Veterinary surgeon or diabetes educator to identify strategies to manage stress and any emotional and social challenges. What are some questions to ask my health care provider?  Do I need to meet with a diabetes educator?  Do I need to meet with a dietitian?  What number can I call if I have questions?  When are the best times to check my blood glucose? Where to find more information:  American Diabetes Association: diabetes.org/food-and-fitness/food  Academy of Nutrition and Dietetics: https://www.vargas.com/  [employer] of Diabetes and Digestive and Kidney Diseases (NIH): FindJewelers.cz Summary  A healthy meal plan will help you control your blood glucose and maintain a healthy lifestyle.  Working with a  diet and nutrition specialist (dietitian) can help you make a meal plan that is best for you.  Keep in mind that carbohydrates and alcohol have immediate effects on your blood glucose levels. It is important to count carbohydrates and to use alcohol carefully. This information is not intended to replace advice given to you by your health care provider. Make sure you discuss any questions you have with your health care provider. Document Released: 01/26/2005 Document Revised: 06/05/2016 Document Reviewed: 06/05/2016 Elsevier Interactive Patient Education  [employer].

## 2017-06-08 ENCOUNTER — Encounter: Payer: Self-pay | Admitting: Podiatry

## 2017-06-08 ENCOUNTER — Ambulatory Visit: Payer: Medicaid Other | Admitting: Podiatry

## 2017-06-08 ENCOUNTER — Telehealth: Payer: Self-pay | Admitting: Family Medicine

## 2017-06-08 DIAGNOSIS — M79673 Pain in unspecified foot: Secondary | ICD-10-CM

## 2017-06-08 DIAGNOSIS — B351 Tinea unguium: Secondary | ICD-10-CM | POA: Diagnosis not present

## 2017-06-08 DIAGNOSIS — M79609 Pain in unspecified limb: Principal | ICD-10-CM

## 2017-06-08 DIAGNOSIS — E1142 Type 2 diabetes mellitus with diabetic polyneuropathy: Secondary | ICD-10-CM

## 2017-06-08 LAB — CMP14+EGFR
A/G RATIO: 1.4 (ref 1.2–2.2)
ALT: 16 IU/L (ref 0–44)
AST: 13 IU/L (ref 0–40)
Albumin: 4 g/dL (ref 3.6–4.8)
Alkaline Phosphatase: 54 IU/L (ref 39–117)
BUN/Creatinine Ratio: 9 — ABNORMAL LOW (ref 10–24)
BUN: 11 mg/dL (ref 8–27)
Bilirubin Total: <0.2 mg/dL (ref 0.0–1.2)
CALCIUM: 10.3 mg/dL — AB (ref 8.6–10.2)
CO2: 17 mmol/L — AB (ref 20–29)
CREATININE: 1.28 mg/dL — AB (ref 0.76–1.27)
Chloride: 105 mmol/L (ref 96–106)
GFR, EST AFRICAN AMERICAN: 69 mL/min/{1.73_m2} (ref >59)
GFR, EST NON AFRICAN AMERICAN: 60 mL/min/{1.73_m2} (ref >59)
GLOBULIN, TOTAL: 2.8 g/dL (ref 1.5–4.5)
Glucose: 365 mg/dL — ABNORMAL HIGH (ref 65–99)
POTASSIUM: 5.8 mmol/L — AB (ref 3.5–5.2)
Sodium: 140 mmol/L (ref 134–144)
TOTAL PROTEIN: 6.8 g/dL (ref 6.0–8.5)

## 2017-06-08 NOTE — Progress Notes (Signed)
Complaint:  Visit Type: Patient returns to my office for continued preventative foot care services. Complaint: Patient states" my nails have grown long and thick and become painful to walk and wear shoes" The patient presents for preventative foot care services. No changes to ROS.  Patient also says his callus on big toes need attention. He says he has been treated for gout with prednisone.  This has elevated his glucose level.  Patient says his doctor wanted him to go to the ER yesterday but he refused.  Podiatric Exam: Vascular: dorsalis pedis and posterior tibial pulses are palpable bilateral. Capillary return is immediate. Temperature gradient is WNL. Skin turgor WNL  Sensorium: Normal Semmes Weinstein monofilament test. Normal tactile sensation bilaterally. Nail Exam: Pt has thick disfigured discolored nails with subungual debris noted bilateral entire nail hallux through fifth toenails Ulcer Exam: There is no evidence of ulcer or pre-ulcerative changes or infection. Orthopedic Exam: Muscle tone and strength are WNL. No limitations in general ROM. No crepitus or effusions noted. HAV  B/L. Skin: No Porokeratosis. No infection or ulcers. Asymptomatic   Pinch callus hallux  B/L  Diagnosis:  Onychomycosis, , Pain in right toe, pain in left toes,   pinch callus. B/L  Treatment & Plan Procedures and Treatment: Consent by patient was obtained for treatment procedures. The patient understood the discussion of treatment and procedures well. All questions were answered thoroughly reviewed. Debridement of mycotic and hypertrophic toenails, 1 through 5 bilateral and clearing of subungual debris. No ulceration, no infection noted. Debride callus   RTC 5 weeks. Return Visit-Office Procedure: Patient instructed to return to the office for a follow up visit when scheduled  for continued evaluation and treatment.    Kyro Joswick DPM 

## 2017-06-08 NOTE — Telephone Encounter (Signed)
  Called [name] to discuss lab results. His glucose was elevated and his anion gap was 20. This is concerning for DKA or HHS. Informed him of this and recommended he go to the ED for evaluation to repeat his electrolytes and check sugars. He states he does not feel like he needs to go to the ED at this time. He feels his sugars are better because he did not drink any soda today. He reports he does not want to go to the ED. I strongly encouraged him to do so. He states he does not have access to car. Does not want to wait in the ED. Again I stated my recommendation would be to go to the ED and it is ultimately his decision.    Dylan PattyAngela Nakia Koble, DO  PGY-2,  Family Medicine 06/08/2017 11:47 AM

## 2017-06-12 ENCOUNTER — Other Ambulatory Visit: Payer: Self-pay

## 2017-06-12 DIAGNOSIS — M109 Gout, unspecified: Secondary | ICD-10-CM

## 2017-06-12 MED ORDER — ALLOPURINOL 100 MG PO TABS: 100.0000 mg | ORAL_TABLET | Freq: Every day | ORAL | 0 refills | Status: DC

## 2017-06-12 NOTE — Telephone Encounter (Signed)
Patient left message on nurse line stating his prescription for All[facility]urinol is at [facility] and needs to be [facility] to [facility] because they will deliver to him. [facility] to [facility]. Ples SpecterAlisa Collene Massimino, RN [facility] RN)

## 2017-06-13 ENCOUNTER — Telehealth: Payer: Self-pay | Admitting: Family Medicine

## 2017-06-13 NOTE — Telephone Encounter (Signed)
Will forward to MD. Jazmin Hartsell,CMA  

## 2017-06-13 NOTE — Telephone Encounter (Signed)
Pt has gout on right foot, he's been taking medication for it but it isn't working. It seems to be getting worse. He asked that [name] call him.

## 2017-06-13 NOTE — Telephone Encounter (Signed)
Returned call to [name], recommended he be seen to address foot pain that is not controlled. Steroids not an option due to his high uncontrolled blood sugars. He is on allopurinol. He has ibuprofen and just took 800 mg. He eats a lot of red meat. He ate two hamburgers last night.   Sugars have been <200 since my last call to him. He did not go to the ED.  [name], DO PGY-2, [facility] 06/13/2017 4:08 PM

## 2017-06-18 ENCOUNTER — Telehealth: Payer: Self-pay | Admitting: Family Medicine

## 2017-06-18 ENCOUNTER — Other Ambulatory Visit: Payer: Self-pay | Admitting: Family Medicine

## 2017-06-18 DIAGNOSIS — M109 Gout, unspecified: Secondary | ICD-10-CM

## 2017-06-18 NOTE — Telephone Encounter (Signed)
Pt said his 100 mg allopurinol is not helping with his gout. He's asking that it be 200 mg. He says its urgent and would like to be called right at 2:45 today.

## 2017-06-18 NOTE — Telephone Encounter (Signed)
Returned call to [name]. His right foot pain is getting worse. He is on 100 mg of allopurinol. He wants to go up to 200 mg. He has taken about 3-4 days of 800 mg ibuprofen. I told him to continue doing that for another 1-2 days. I told him I'd like to check his kidney levels. I will place a lab order for BMP and uric acid if he able to get to the office for a lab draw. I asked him to come be evaluated in the office if this plan does not work to relieve his gout. He is agreeable and will work with the cab service that brings him to the office. Sugar wise he is getting 140-160 fasting CBGs, which is much better than before.   Dolores PattyAngela Alianny Toelle, DO PGY-2, [facility] 06/18/2017 4:41 PM

## 2017-06-19 ENCOUNTER — Telehealth: Payer: Self-pay

## 2017-06-19 NOTE — Telephone Encounter (Signed)
  Returned call to [place] at [facility]. Mr Dylan Jordan is taking 200 mg allopurinol daily. No colchicine needed, he is taking 800 mg ibuprofen q8 hrs. Will get labs checked Friday possibly and/or come in to be seen based on his transportation situation.  [name], DO PGY-2, [facility] 06/19/2017 4:05 PM

## 2017-06-19 NOTE — Telephone Encounter (Signed)
Caryn BeeKevin, pharmacist from [facility], left message on nurse line that Colchicine is non-preferred with Medicaid. No other real options except possibly Indocin or increasing dose of Allopurinol. Please let him know what you would like to do. Phone number is [phone]

## 2017-06-22 ENCOUNTER — Telehealth: Payer: Self-pay | Admitting: Family Medicine

## 2017-06-22 ENCOUNTER — Other Ambulatory Visit: Payer: Self-pay | Admitting: Family Medicine

## 2017-06-22 ENCOUNTER — Other Ambulatory Visit: Payer: Medicaid Other

## 2017-06-22 DIAGNOSIS — M109 Gout, unspecified: Secondary | ICD-10-CM

## 2017-06-22 MED ORDER — BENZONATATE 100 MG PO CAPS: 100.0000 mg | ORAL_CAPSULE | Freq: Two times a day (BID) | ORAL | 0 refills | Status: DC | PRN

## 2017-06-22 NOTE — Patient Instructions (Signed)

## 2017-06-22 NOTE — Telephone Encounter (Signed)
I'll send in tessalon for his cough.

## 2017-06-22 NOTE — Telephone Encounter (Signed)
Pt also is requesting some medicine to be called in for his cough---something with no sugar or alcohol.  [facility].

## 2017-06-22 NOTE — Telephone Encounter (Signed)
Diet list printed and medicaid dentist list given to patient.  Will check with MD regarding medications and see if RN team has seen a PA for patient's diclofenac. Jiali Linney,CMA

## 2017-06-22 NOTE — Telephone Encounter (Signed)
Pt would like information about what dentist he can go to on Medicaid.  Please advise. He also has questions about why he cannot get diclonfec sodium from Medicaid. He would like to also get a diet plan about what he can eat since he has gout.

## 2017-06-23 LAB — URIC ACID: Uric Acid: 4.9 mg/dL (ref 3.7–8.6)

## 2017-06-23 LAB — BASIC METABOLIC PANEL
BUN/Creatinine Ratio: 9 — ABNORMAL LOW (ref 10–24)
BUN: 11 mg/dL (ref 8–27)
CALCIUM: 9.7 mg/dL (ref 8.6–10.2)
CO2: 21 mmol/L (ref 20–29)
Chloride: 104 mmol/L (ref 96–106)
Creatinine, Ser: 1.25 mg/dL (ref 0.76–1.27)
GFR, EST AFRICAN AMERICAN: 71 mL/min/{1.73_m2} (ref >59)
GFR, EST NON AFRICAN AMERICAN: 62 mL/min/{1.73_m2} (ref >59)
Glucose: 192 mg/dL — ABNORMAL HIGH (ref 65–99)
Potassium: 5.3 mmol/L — ABNORMAL HIGH (ref 3.5–5.2)
Sodium: 140 mmol/L (ref 134–144)

## 2017-06-25 ENCOUNTER — Other Ambulatory Visit: Payer: Self-pay | Admitting: *Deleted

## 2017-06-25 ENCOUNTER — Other Ambulatory Visit: Payer: Self-pay | Admitting: Family Medicine

## 2017-06-25 DIAGNOSIS — M109 Gout, unspecified: Secondary | ICD-10-CM

## 2017-06-25 MED ORDER — ALLOPURINOL 100 MG PO TABS: 200.0000 mg | ORAL_TABLET | Freq: Every day | ORAL | 3 refills | Status: DC

## 2017-06-25 NOTE — Telephone Encounter (Signed)
Faxed received from pharmacy requesting a refill of patient's medication since there was an increase of his allopurinol from 100mg  to 200mg .  Dylan Jordan,CMA

## 2017-06-26 ENCOUNTER — Telehealth: Payer: Self-pay | Admitting: Family Medicine

## 2017-06-26 ENCOUNTER — Telehealth: Payer: Self-pay | Admitting: *Deleted

## 2017-06-26 NOTE — Telephone Encounter (Signed)
-----   Message from Tillman SersAngela C [name], DO sent at 06/25/2017  3:33 PM EST ----- Can we please call [name] and tell him his labs from Friday were normal- kidney function was okay despite increase in gout medicine and NSAIDs, and his uric acid level was <6 so at target range for gout treatment. Thanks

## 2017-06-26 NOTE — Telephone Encounter (Signed)
Spoke with patient and informed him that his uric acid level was 4.9.  Patient voiced understanding and states that he will be going out of town today and is not able to pick up his new script of the allopurinol of 200mg  until he gets back on Tuesday.  Patient plans to take 100mg  of this medication so just his current quantity will last.  All questions answered.  Jazmin Hartsell,CMA

## 2017-06-26 NOTE — Telephone Encounter (Signed)
Pt would like the number for uric acid.  He wants the specific number--not less than 6.  Please call him

## 2017-06-26 NOTE — Telephone Encounter (Signed)
Patient informed and all questions answered. Jazmin Hartsell,CMA

## 2017-06-27 ENCOUNTER — Ambulatory Visit: Payer: Medicaid Other | Admitting: Internal Medicine

## 2017-06-28 ENCOUNTER — Other Ambulatory Visit: Payer: Self-pay | Admitting: Family Medicine

## 2017-07-02 ENCOUNTER — Other Ambulatory Visit: Payer: Self-pay | Admitting: Family Medicine

## 2017-07-03 ENCOUNTER — Ambulatory Visit: Payer: Medicaid Other | Admitting: Allergy and Immunology

## 2017-07-03 ENCOUNTER — Other Ambulatory Visit: Payer: Self-pay | Admitting: Family Medicine

## 2017-07-05 ENCOUNTER — Ambulatory Visit: Payer: Medicaid Other | Admitting: Internal Medicine

## 2017-07-05 ENCOUNTER — Encounter: Payer: Self-pay | Admitting: Internal Medicine

## 2017-07-05 ENCOUNTER — Other Ambulatory Visit: Payer: Self-pay

## 2017-07-05 ENCOUNTER — Other Ambulatory Visit: Payer: Self-pay | Admitting: Internal Medicine

## 2017-07-05 DIAGNOSIS — M109 Gout, unspecified: Secondary | ICD-10-CM | POA: Diagnosis not present

## 2017-07-05 DIAGNOSIS — R197 Diarrhea, unspecified: Secondary | ICD-10-CM | POA: Diagnosis present

## 2017-07-05 MED ORDER — COLCHICINE 0.6 MG PO TABS: ORAL_TABLET | ORAL | 0 refills | Status: DC

## 2017-07-05 MED ORDER — COLCHICINE 0.6 MG PO CAPS: ORAL_CAPSULE | ORAL | 0 refills | Status: DC

## 2017-07-05 NOTE — Patient Instructions (Addendum)
It was so nice to see you!  I have given you a couple of tablets of a medication called Colchicine, which is used to treat gout flares. Please take 2 tablets once, then take 1 tablet one hour later.  -Dr. Nancy MarusMayo

## 2017-07-05 NOTE — Assessment & Plan Note (Signed)
Likely recent gout flare that is resolving, given that the base of his right toe was warm, red, and very tender to palpation. Seems to be getting better on its own, but still with some residual tenderness and erythema. No fevers or chills to suggest septic arthritis. No trauma to suggest fracture. - Continue Allopurinol 200mg  daily - Prescription given for colchicine to be used for gout flares- should take 1.2mg  once, then another 0.6mg  one hour later - Follow-up with PCP as needed - If no improvement, may need to consider imaging

## 2017-07-05 NOTE — Progress Notes (Signed)
   [facility] Phone: [phone]  Subjective:  Dylan Jordan is a 62 year old male presenting to clinic with right great toe pain. The pain is located at the base of his right great toe. The pain is "sharp" and "throbbing". The pain started 6 weeks ago, but has gotten better over the last couple of days. The pain was so bad that he couldn't even touch his toe or bend it. He tried taking some Ibuprofen, which helped. The toe has been red and warm. He has been taking the allopurinol as prescribed. No fevers. No trauma to the toe.  ROS: See HPI for pertinent positives and negatives  Past Medical History- HTN, asthma, T2DM, CKD  Family history reviewed for today's visit. No changes.  Social history- patient is a current smoker. Hx alcohol and substance abuse.  Objective: BP 110/60   Pulse 65   Temp 98.7 F (37.1 C) (Oral)   Ht 6\' 2"  (1.88 m)   Wt 223 lb (101.2 kg)   SpO2 95%   BMI 28.63 kg/m  Gen: NAD, alert, cooperative with exam Msk: Base of right toe mildly erythematous, no warmth, no swelling, no gross deformity. Full ROM. + mild tenderness to palpation.  Assessment/Plan: Gout: Likely recent gout flare that is resolving, given that the base of his right toe was warm, red, and very tender to palpation. Seems to be getting better on its own, but still with some residual tenderness and erythema. No fevers or chills to suggest septic arthritis. No trauma to suggest fracture. - Continue Allopurinol 200mg  daily - Prescription given for colchicine to be used for gout flares- should take 1.2mg  once, then another 0.6mg  one hour later - Follow-up with PCP as needed - If no improvement, may need to consider imaging   Willadean CarolKaty Mazikeen Hehn, MD PGY-3

## 2017-07-11 ENCOUNTER — Ambulatory Visit: Payer: Medicaid Other | Admitting: Podiatry

## 2017-07-11 ENCOUNTER — Encounter: Payer: Self-pay | Admitting: Podiatry

## 2017-07-11 DIAGNOSIS — M79676 Pain in unspecified toe(s): Secondary | ICD-10-CM

## 2017-07-11 DIAGNOSIS — E1142 Type 2 diabetes mellitus with diabetic polyneuropathy: Secondary | ICD-10-CM | POA: Diagnosis not present

## 2017-07-11 DIAGNOSIS — B351 Tinea unguium: Secondary | ICD-10-CM | POA: Diagnosis not present

## 2017-07-11 DIAGNOSIS — M79609 Pain in unspecified limb: Principal | ICD-10-CM

## 2017-07-11 NOTE — Progress Notes (Signed)
Complaint:  Visit Type: Patient returns to my office for continued preventative foot care services. Complaint: Patient states" my nails have grown long and thick and become painful to walk and wear shoes" The patient presents for preventative foot care services. No changes to ROS.  Patient also says his callus on big toes need attention. He says he has been treated for gout with prednisone.  This has elevated his glucose level.  Patient says his doctor wanted him to go to the ER yesterday but he refused.  Podiatric Exam: Vascular: dorsalis pedis and posterior tibial pulses are palpable bilateral. Capillary return is immediate. Temperature gradient is WNL. Skin turgor WNL  Sensorium: Normal Semmes Weinstein monofilament test. Normal tactile sensation bilaterally. Nail Exam: Pt has thick disfigured discolored nails with subungual debris noted bilateral entire nail hallux through fifth toenails Ulcer Exam: There is no evidence of ulcer or pre-ulcerative changes or infection. Orthopedic Exam: Muscle tone and strength are WNL. No limitations in general ROM. No crepitus or effusions noted. HAV  B/L. Skin: No Porokeratosis. No infection or ulcers. Asymptomatic   Pinch callus hallux  B/L  Diagnosis:  Onychomycosis, , Pain in right toe, pain in left toes,   pinch callus. B/L  Treatment & Plan Procedures and Treatment: Consent by patient was obtained for treatment procedures. The patient understood the discussion of treatment and procedures well. All questions were answered thoroughly reviewed. Debridement of mycotic and hypertrophic toenails, 1 through 5 bilateral and clearing of subungual debris. No ulceration, no infection noted. Debride callus   RTC 5 weeks. Return Visit-Office Procedure: Patient instructed to return to the office for a follow up visit when scheduled  for continued evaluation and treatment.    Helane GuntherGregory Haylin Camilli DPM

## 2017-07-12 LAB — C DIFFICILE, CYTOTOXIN B

## 2017-07-12 LAB — C DIFFICILE TOXINS A+B W/RFLX: C DIFFICILE TOXINS A+B, EIA: NEGATIVE

## 2017-07-30 ENCOUNTER — Ambulatory Visit: Payer: Medicaid Other | Admitting: Allergy and Immunology

## 2017-07-30 ENCOUNTER — Encounter: Payer: Self-pay | Admitting: Allergy and Immunology

## 2017-07-30 VITALS — BP 122/70 | HR 82 | Temp 98.0°F | Resp 18 | Ht 74.0 in | Wt 225.0 lb

## 2017-07-30 DIAGNOSIS — J4530 Mild persistent asthma, uncomplicated: Secondary | ICD-10-CM

## 2017-07-30 DIAGNOSIS — T7800XD Anaphylactic reaction due to unspecified food, subsequent encounter: Secondary | ICD-10-CM

## 2017-07-30 DIAGNOSIS — H1013 Acute atopic conjunctivitis, bilateral: Secondary | ICD-10-CM

## 2017-07-30 DIAGNOSIS — H1010 Acute atopic conjunctivitis, unspecified eye: Secondary | ICD-10-CM | POA: Insufficient documentation

## 2017-07-30 DIAGNOSIS — J3089 Other allergic rhinitis: Secondary | ICD-10-CM | POA: Insufficient documentation

## 2017-07-30 DIAGNOSIS — T7800XA Anaphylactic reaction due to unspecified food, initial encounter: Secondary | ICD-10-CM | POA: Insufficient documentation

## 2017-07-30 MED ORDER — AZELASTINE HCL 0.1 % NA SOLN: 1.0000 | Freq: Two times a day (BID) | NASAL | 5 refills | Status: DC | PRN

## 2017-07-30 MED ORDER — EPINEPHRINE 0.3 MG/0.3ML IJ SOAJ: 0.3000 mg | Freq: Once | INTRAMUSCULAR | 2 refills | Status: AC

## 2017-07-30 MED ORDER — OLOPATADINE HCL 0.1 % OP SOLN: 1.0000 [drp] | Freq: Two times a day (BID) | OPHTHALMIC | 5 refills | Status: DC | PRN

## 2017-07-30 MED ORDER — FLUTICASONE PROPIONATE HFA 110 MCG/ACT IN AERO
2.0000 | INHALATION_SPRAY | Freq: Two times a day (BID) | RESPIRATORY_TRACT | 5 refills | Status: DC
Start: 2017-07-30 — End: 2018-12-24

## 2017-07-30 NOTE — Assessment & Plan Note (Signed)
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  Food allergen skin testing today did reveal positive result to pineapple.  Continue avoidance of pineapple.  A food allergy action plan has been provided and discussed.  A prescription has been provided for epinephrine 0.3 mg autoinjector 2 pack along with instructions for its proper administration.

## 2017-07-30 NOTE — Assessment & Plan Note (Addendum)
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  Aeroallergen avoidance measures have been discussed and provided in written form.   A prescription has been provided for azelastine nasal spray, 1-2 sprays per nostril 2 times daily as needed. Proper nasal spray technique has been discussed and demonstrated.   Nasal saline spray (i.e., Simply Saline) or nasal saline lavage (i.e., NeilMed) is recommended as needed and prior to medicated nasal sprays.

## 2017-07-30 NOTE — Assessment & Plan Note (Signed)
   Treatment plan as outlined above for allergic rhinitis.  A prescription has been provided for Patanol, one drop per eye twice daily as needed.  I have also recommended eye lubricant drops (i.e., Natural Tears) as needed. 

## 2017-07-30 NOTE — Progress Notes (Signed)
New Patient Note  RE: Dylan Jordan MRN: [id number] DOB: 10/30/1955 Date of Office Visit: 07/30/2017  Referring provider: Tillman Sers, DO Primary care provider: Tillman Sers, DO  Chief Complaint: Nasal Congestion and Asthma   History of present illness: Dylan Jordan is a 62 y.o. male seen today in consultation requested by [name], DO.  He complains of nasal congestion, rhinorrhea, nasal pruritus, sneezing, ocular pruritus, and lacrimation.  No significant seasonal symptom variation has been noted nor have specific environmental triggers been identified.  He reports that he was diagnosed with asthma approximately 10 years ago.  His asthma symptoms consist of coughing, dyspnea, and wheezing.  He is typically awakened from sleep 2 nights per month with lower respiratory symptoms.  He had been on montelukast, however states that he recently discontinued this medication.  He has a prescription for albuterol HFA. 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  Apparently, in 1976 he consumed pineapple and developed urticarial lesions "all over".  He does not recall experiencing concomitant cardiopulmonary or GI symptoms.  He has avoided pineapple since that time for the most part, however notes that when he consumes a small amount of pineapple he experiences generalized pruritus.   Assessment and plan: Allergic rhinitis with a nonallergic component Skin test was positive to a single tree pollen, however his symptoms are year-round suggesting mixed rhinitis.  Aeroallergen avoidance measures have been discussed and provided in written form.   A prescription has been provided for azelastine nasal spray, 1-2 sprays per nostril 2 times daily as needed. Proper nasal spray technique has been discussed and demonstrated.   Nasal saline spray (i.e., Simply Saline) or nasal saline lavage (i.e., NeilMed) is recommended as needed and prior to medicated nasal  sprays.  Allergic conjunctivitis  Treatment plan as outlined above for allergic rhinitis.  A prescription has been provided for Patanol, one drop per eye twice daily as needed.  I have also recommended eye lubricant drops (i.e., Natural Tears) as needed.  Mild persistent asthma  A prescription has been provided for Flovent (fluticasone) 110 g,  2 inhalations twice a day. To maximize pulmonary deposition, a spacer has been provided along with instructions for its proper administration with an HFA inhaler.  Continue albuterol HFA, 1-2 inhalations every 6 hours if needed.  Subjective and objective measures of pulmonary function will be followed and the treatment plan will be adjusted accordingly.  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  Food allergen skin testing today did reveal positive result to pineapple.  Continue avoidance of pineapple.  A food allergy action plan has been provided and discussed.  A prescription has been provided for epinephrine 0.3 mg autoinjector 2 pack along with instructions for its proper administration.   Meds ordered this encounter  Medications  . EPINEPHrine (EPIPEN 2-PAK) 0.3 mg/0.3 mL IJ SOAJ injection    Sig: Inject 0.3 mLs (0.3 mg total) into the muscle once for 1 dose.    Dispense:  2 Device    Refill:  2  . azelastine (ASTELIN) 0.1 % nasal spray    Sig: Place 1-2 sprays into both nostrils 2 (two) times daily as needed for rhinitis.    Dispense:  30 mL    Refill:  5  . olopatadine (PATANOL) 0.1 % ophthalmic solution    Sig: Place 1 drop into both eyes 2 (two) times daily as needed for allergies.    Dispense:  5 mL  Refill:  5  . fluticasone (FLOVENT HFA) 110 MCG/ACT inhaler    Sig: Inhale 2 puffs into the lungs 2 (two) times daily.    Dispense:  1 Inhaler    Refill:  5    Diagnostics: Spirometry: FVC was 1.93 L and FEV1 was 1.67 L (46% predicted) with 70 mL (4%) postbronchodilator improvement.  Restrictive  pattern without significant post bronchodilator reversibility.  The effort/technique was questionable.  Please see scanned spirometry results for details. Epicutaneous testing: Positive to box elder tree pollen. Intradermal testing: Negative despite a positive histamine control. Food allergen skin testing: Positive to pineapple.    Physical examination: Blood pressure 122/70, pulse 82, temperature 98 F (36.7 C), resp. rate 18, height 6\' 2"  (1.88 m), weight 225 lb (102.1 kg), SpO2 96 %.  General: Alert, interactive, in no acute distress. HEENT: TMs pearly gray, turbinates moderately edematous with clear discharge, post-pharynx moderately erythematous. Neck: Supple without lymphadenopathy. Lungs: Mildly decreased breath sounds bilaterally without wheezing, rhonchi or rales. CV: Normal S1, S2 without murmurs. Abdomen: Nondistended, nontender. Skin: Warm and dry, without lesions or rashes. Extremities:  No clubbing, cyanosis or edema. Neuro:   Grossly intact.  Review of systems:  Review of systems negative except as noted in HPI / PMHx or noted below: Review of Systems  Constitutional: Negative.   HENT: Negative.   Eyes: Negative.   Respiratory: Negative.   Cardiovascular: Negative.   Gastrointestinal: Negative.   Genitourinary: Negative.   Musculoskeletal: Negative.   Skin: Negative.   Neurological: Negative.   Endo/Heme/Allergies: Negative.   Psychiatric/Behavioral: Negative.     Past medical history:  Past Medical History:  Diagnosis Date  . Anemia   . Asthma   . Diabetes mellitus without complication (HCC)   . Gout   . Hyperlipidemia   . Hypertension     Past surgical history:  Past Surgical History:  Procedure Laterality Date  . REPLACEMENT TOTAL KNEE Left   . TOE SURGERY      Family history: Family History  Problem Relation Age of Onset  . Allergic rhinitis Neg Hx   . Angioedema Neg Hx   . Asthma Neg Hx   . Atopy Neg Hx   . Eczema Neg Hx   .  Immunodeficiency Neg Hx   . Urticaria Neg Hx     Social history: Social History   Socioeconomic History  . Marital status: Single    Spouse name: Not on file  . Number of children: Not on file  . Years of education: Not on file  . Highest education level: Not on file  Social Needs  . Financial resource strain: Not on file  . Food insecurity - worry: Not on file  . Food insecurity - inability: Not on file  . Transportation needs - medical: Not on file  . Transportation needs - non-medical: Not on file  Occupational History  . Not on file  Tobacco Use  . Smoking status: Current Every Day Smoker    Packs/day: 0.50    Years: 45.00    Pack years: 22.50    Types: Cigarettes    Start date: 05/16/1971  . Smokeless tobacco: Never Used  Substance and Sexual Activity  . Alcohol use: No  . Drug use: No  . Sexual activity: Not on file  Other Topics Concern  . Not on file  Social History Narrative  . Not on file   Environmental History: The patient lives in an apartment with carpeting throughout and central air/heat.  There is no known mold/water damage in the apartment.  He has no pets.  He is a cigarette smoker, having smoked 1 pack/day on average over the past 40 years.  Allergies as of 07/30/2017      Reactions   Ativan [lorazepam] Other (See Comments)   Agitation (and "5 others like it") [facility] need to [facility] [place] (phone) [phone] and [phone] to fax a release-   Penicillins Diarrhea   Has patient had a PCN reaction causing immediate rash, facial/tongue/throat swelling, SOB or lightheadedness with hypotension: No Has patient had a PCN reaction causing severe rash involving mucus membranes or skin necrosis: No Has patient had a PCN reaction that required hospitalization: No Has patient had a PCN reaction occurring within the last 10 years: No If all of the above answers are "NO", then may proceed with Cephalosporin use.      Medication List         Accurate as of 07/30/17  5:35 PM. Always use your most recent med list.          ACCU-CHEK AVIVA PLUS test strip Generic drug:  glucose blood use as directed to check blood sugar THREE TIMES DAILY   ACCU-CHEK SOFTCLIX LANCETS lancets use as directed *may use THREE TIMES DAILY to check blood sugar*   acetaminophen 650 MG CR tablet Commonly known as:  TYLENOL Take 650 mg by mouth 2 (two) times daily as needed for pain.   allopurinol 100 MG tablet Commonly known as:  ZYLOPRIM Take 2 tablets (200 mg total) by mouth daily.   amLODipine 5 MG tablet Commonly known as:  NORVASC Take 1 TABLET BY MOUTH EVERY DAY   ammonium lactate 12 % cream Commonly known as:  AMLACTIN Apply topically as needed for dry skin.   aspirin EC 81 MG tablet Take 1 tablet (81 mg total) by mouth daily.   atorvastatin 20 MG tablet Commonly known as:  LIPITOR take 1 TABLET BY MOUTH EVERY DAY   azelastine 0.1 % nasal spray Commonly known as:  ASTELIN Place 1-2 sprays into both nostrils 2 (two) times daily as needed for rhinitis.   benzonatate 100 MG capsule Commonly known as:  TESSALON Take 1 capsule (100 mg total) by mouth 2 (two) times daily as needed for cough.   benztropine 2 MG tablet Commonly known as:  COGENTIN Take 2 mg by mouth 2 (two) times daily.   cetirizine 10 MG tablet Commonly known as:  ZYRTEC Take 1 tablet (10 mg total) by mouth daily.   cholecalciferol 1000 units tablet Commonly known as:  VITAMIN D Take 1 tablet (1,000 Units total) by mouth daily.   Colchicine 0.6 MG Caps Commonly known as:  MITIGARE Take 2 tablets once, then take 1 tablet one hour later.   DAILY-VITE Tabs TAKE 1 TABLET BY MOUTH DAILY.   diclofenac sodium 1 % Gel Commonly known as:  VOLTAREN Apply 4 g topically 4 (four) times daily.   divalproex 500 MG DR tablet Commonly known as:  DEPAKOTE Take 500-2,000 mg by mouth See admin instructions. Take 500mg  every morning and 2000 tabs every evening    EPINEPHrine 0.3 mg/0.3 mL Soaj injection Commonly known as:  EPIPEN 2-PAK Inject 0.3 mLs (0.3 mg total) into the muscle once for 1 dose.   ferrous sulfate 325 (65 FE) MG tablet Commonly known as:  FERROUSUL Take 1 tablet (325 mg total) by mouth daily with breakfast.   fluticasone 110 MCG/ACT inhaler Commonly known as:  FLOVENT HFA Inhale 2 puffs into the lungs 2 (two) times daily.   fluticasone 50 MCG/ACT nasal spray Commonly known as:  FLONASE Place 2 sprays into both nostrils daily.   Fluticasone-Salmeterol 250-50 MCG/DOSE Aepb Commonly known as:  ADVAIR DISKUS Inhale 1 puff into the lungs 2 (two) times daily.   gabapentin 300 MG capsule Commonly known as:  NEURONTIN Take 1 capsule (300 mg total) by mouth 3 (three) times daily as needed.   glimepiride 4 MG tablet Commonly known as:  AMARYL Take 1 tablet (4 mg total) by mouth 2 (two) times daily   haloperidol 10 MG tablet Commonly known as:  HALDOL Take 5 mg by mouth every evening.   haloperidol decanoate 100 MG/ML injection Commonly known as:  HALDOL DECANOATE Inject 400 mg into the muscle every 28 (twenty-eight) days.   JARDIANCE 10 MG Tabs tablet Generic drug:  empagliflozin Take 10 mg by mouth daily.   lithium carbonate 300 MG capsule Take 300 mg by mouth 2 (two) times daily with a meal.   losartan 25 MG tablet Commonly known as:  COZAAR TAKE 1 TABLET BY MOUTH EVERY DAY   montelukast 10 MG tablet Commonly known as:  SINGULAIR take 1 TABLET BY MOUTH EVERY DAY   nicotine 21 mg/24hr patch Commonly known as:  NICODERM CQ - dosed in mg/24 hours Place 1 patch (21 mg total) onto the skin daily.   olopatadine 0.1 % ophthalmic solution Commonly known as:  PATANOL Place 1 drop into both eyes 2 (two) times daily as needed for allergies.   omeprazole 20 MG capsule Commonly known as:  PRILOSEC Take 2 capsules (40 mg total) by mouth daily.   phenylephrine-shark liver oil-mineral oil-petrolatum 0.25-14-74.9 %  rectal ointment Commonly known as:  PREPARATION H Place 1 application rectally 2 (two) times daily as needed for hemorrhoids.   vancomycin 50 mg/mL oral solution Commonly known as:  VANCOCIN Take 2.5 mLs (125 mg total) by mouth every 6 (six) hours.   Vitamin B-12 6000 MCG Subl Place 6,000 mcg under the tongue daily.       Known medication allergies: Allergies  Allergen Reactions  . Ativan [Lorazepam] Other (See Comments)    Agitation (and "5 others like it") Will need to call [facility] [place] (phone) [phone] and [phone] to fax a release-   . Penicillins Diarrhea    Has patient had a PCN reaction causing immediate rash, facial/tongue/throat swelling, SOB or lightheadedness with hypotension: No Has patient had a PCN reaction causing severe rash involving mucus membranes or skin necrosis: No Has patient had a PCN reaction that required hospitalization: No Has patient had a PCN reaction occurring within the last 10 years: No If all of the above answers are "NO", then may proceed with Cephalosporin use.     I appreciate the opportunity to take part in Emric's care. Please do not hesitate to contact me with questions.  Sincerely,   R. Jorene Guest, MD

## 2017-07-30 NOTE — Assessment & Plan Note (Signed)
   A prescription has been provided for Flovent (fluticasone) 110 g, 2 inhalations twice a day. To maximize pulmonary deposition, a spacer has been provided along with instructions for its proper administration with an HFA inhaler.  Continue albuterol HFA, 1-2 inhalations every 6 hours if needed.  Subjective and objective measures of pulmonary function will be followed and the treatment plan will be adjusted accordingly.

## 2017-07-30 NOTE — Patient Instructions (Addendum)
Allergic rhinitis with a nonallergic component Skin test was positive to a single tree pollen, however his symptoms are year-round suggesting mixed rhinitis.  Aeroallergen avoidance measures have been discussed and provided in written form.   A prescription has been provided for azelastine nasal spray, 1-2 sprays per nostril 2 times daily as needed. Proper nasal spray technique has been discussed and demonstrated.   Nasal saline spray (i.e., Simply Saline) or nasal saline lavage (i.e., NeilMed) is recommended as needed and prior to medicated nasal sprays.  Allergic conjunctivitis  Treatment plan as outlined above for allergic rhinitis.  A prescription has been provided for Patanol, one drop per eye twice daily as needed.  I have also recommended eye lubricant drops (i.e., Natural Tears) as needed.  Mild persistent asthma  A prescription has been provided for Flovent (fluticasone) 110 g,  2 inhalations twice a day. To maximize pulmonary deposition, a spacer has been provided along with instructions for its proper administration with an HFA inhaler.  Continue albuterol HFA, 1-2 inhalations every 6 hours if needed.  Subjective and objective measures of pulmonary function will be followed and the treatment plan will be adjusted accordingly.  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  Food allergen skin testing today did reveal positive result to pineapple.  Continue avoidance of pineapple.  A food allergy action plan has been provided and discussed.  A prescription has been provided for epinephrine 0.3 mg autoinjector 2 pack along with instructions for its proper administration.   Return in about 3 months (around 10/30/2017), or if symptoms worsen or fail to improve.   Reducing Pollen Exposure  The American Academy of Allergy, Asthma and Immunology suggests the following steps to reduce your exposure to pollen during allergy seasons.    1. Do not hang  sheets or clothing out to dry; pollen may collect on these items. 2. Do not mow lawns or spend time around freshly cut grass; mowing stirs up pollen. 3. Keep windows closed at night.  Keep car windows closed while driving. 4. Minimize morning activities outdoors, a time when pollen counts are usually at their highest. 5. Stay indoors as much as possible when pollen counts or humidity is high and on windy days when pollen tends to remain in the air longer. 6. Use air conditioning when possible.  Many air conditioners have filters that trap the pollen spores. 7. Use a HEPA room air filter to remove pollen form the indoor air you breathe.

## 2017-08-06 ENCOUNTER — Other Ambulatory Visit: Payer: Self-pay | Admitting: Allergy and Immunology

## 2017-08-06 NOTE — Telephone Encounter (Signed)
Sp[facility] to [facility] states they did not receive refills. Called in Flovent HFA 110 and astelin nasal spray as written per Dr Nunzio CobbsBobbitt. Patient advised these were called in.

## 2017-08-06 NOTE — Telephone Encounter (Signed)
Patient was seen on 07-30-17, by Dr. Nunzio CobbsBobbitt. He states he has checked with his pharmacy, [facility] [address], does not have these prescriptions. I told him it showed they were both received on 3-18 at 6:19 p.m. He said he just now called them and they do not have them. Astelin and Flovent. He would like a call back to let him know what happened to them.

## 2017-08-13 ENCOUNTER — Other Ambulatory Visit: Payer: Self-pay | Admitting: Family Medicine

## 2017-08-15 ENCOUNTER — Encounter: Payer: Self-pay | Admitting: Podiatry

## 2017-08-15 ENCOUNTER — Ambulatory Visit: Payer: Medicaid Other | Admitting: Podiatry

## 2017-08-15 DIAGNOSIS — M79676 Pain in unspecified toe(s): Secondary | ICD-10-CM

## 2017-08-15 DIAGNOSIS — B351 Tinea unguium: Secondary | ICD-10-CM | POA: Diagnosis not present

## 2017-08-15 DIAGNOSIS — E1142 Type 2 diabetes mellitus with diabetic polyneuropathy: Secondary | ICD-10-CM

## 2017-08-15 DIAGNOSIS — M79609 Pain in unspecified limb: Principal | ICD-10-CM

## 2017-08-15 NOTE — Progress Notes (Signed)
Complaint:  Visit Type: Patient returns to my office for continued preventative foot care services. Complaint: Patient states" my nails have grown long and thick and become painful to walk and wear shoes" The patient presents for preventative foot care services. No changes to ROS.    Podiatric Exam: Vascular: dorsalis pedis and posterior tibial pulses are palpable bilateral. Capillary return is immediate. Temperature gradient is WNL. Skin turgor WNL  Sensorium: Normal Semmes Weinstein monofilament test. Normal tactile sensation bilaterally. Nail Exam: Pt has thick disfigured discolored nails with subungual debris noted bilateral entire nail hallux through fifth toenails Ulcer Exam: There is no evidence of ulcer or pre-ulcerative changes or infection. Orthopedic Exam: Muscle tone and strength are WNL. No limitations in general ROM. No crepitus or effusions noted. HAV  B/L. Skin: No Porokeratosis. No infection or ulcers. Asymptomatic   Pinch callus hallux  B/L  Diagnosis:  Onychomycosis, , Pain in right toe, pain in left toes,   pinch callus. B/L  Treatment & Plan Procedures and Treatment: Consent by patient was obtained for treatment procedures. The patient understood the discussion of treatment and procedures well. All questions were answered thoroughly reviewed. Debridement of mycotic and hypertrophic toenails, 1 through 5 bilateral and clearing of subungual debris. No ulceration, no infection noted.  RTC 5 weeks. Return Visit-Office Procedure: Patient instructed to return to the office for a follow up visit when scheduled  for continued evaluation and treatment.    Helane GuntherGregory Shaquon Gropp DPM

## 2017-08-24 ENCOUNTER — Emergency Department (HOSPITAL_COMMUNITY): Payer: Medicaid Other

## 2017-08-24 ENCOUNTER — Encounter (HOSPITAL_COMMUNITY): Payer: Self-pay | Admitting: Emergency Medicine

## 2017-08-24 ENCOUNTER — Emergency Department (HOSPITAL_COMMUNITY)
Admission: EM | Admit: 2017-08-24 | Discharge: 2017-08-24 | Disposition: A | Discharge status: Home / Self Care | Payer: Medicaid Other | Attending: Emergency Medicine | Admitting: Emergency Medicine

## 2017-08-24 DIAGNOSIS — R079 Chest pain, unspecified: Secondary | ICD-10-CM | POA: Diagnosis not present

## 2017-08-24 DIAGNOSIS — N189 Chronic kidney disease, unspecified: Secondary | ICD-10-CM | POA: Diagnosis not present

## 2017-08-24 DIAGNOSIS — Z7982 Long term (current) use of aspirin: Secondary | ICD-10-CM | POA: Insufficient documentation

## 2017-08-24 DIAGNOSIS — J45909 Unspecified asthma, uncomplicated: Secondary | ICD-10-CM | POA: Insufficient documentation

## 2017-08-24 DIAGNOSIS — F17210 Nicotine dependence, cigarettes, uncomplicated: Secondary | ICD-10-CM | POA: Diagnosis not present

## 2017-08-24 DIAGNOSIS — E119 Type 2 diabetes mellitus without complications: Secondary | ICD-10-CM | POA: Diagnosis not present

## 2017-08-24 DIAGNOSIS — Z7984 Long term (current) use of oral hypoglycemic drugs: Secondary | ICD-10-CM | POA: Diagnosis not present

## 2017-08-24 DIAGNOSIS — Z79899 Other long term (current) drug therapy: Secondary | ICD-10-CM | POA: Insufficient documentation

## 2017-08-24 DIAGNOSIS — Z96652 Presence of left artificial knee joint: Secondary | ICD-10-CM | POA: Diagnosis not present

## 2017-08-24 DIAGNOSIS — E1122 Type 2 diabetes mellitus with diabetic chronic kidney disease: Secondary | ICD-10-CM | POA: Insufficient documentation

## 2017-08-24 DIAGNOSIS — I129 Hypertensive chronic kidney disease with stage 1 through stage 4 chronic kidney disease, or unspecified chronic kidney disease: Secondary | ICD-10-CM | POA: Insufficient documentation

## 2017-08-24 LAB — CBC WITH DIFFERENTIAL/PLATELET
BASOS PCT: 0 %
Basophils Absolute: 0 10*3/uL (ref 0.0–0.1)
EOS ABS: 0.2 10*3/uL (ref 0.0–0.7)
EOS PCT: 4 %
HEMATOCRIT: 39.3 % (ref 39.0–52.0)
Hemoglobin: 12.4 g/dL — ABNORMAL LOW (ref 13.0–17.0)
Lymphocytes Relative: 26 %
Lymphs Abs: 1.7 10*3/uL (ref 0.7–4.0)
MCH: 28.8 pg (ref 26.0–34.0)
MCHC: 31.6 g/dL (ref 30.0–36.0)
MCV: 91.2 fL (ref 78.0–100.0)
MONO ABS: 0.4 10*3/uL (ref 0.1–1.0)
MONOS PCT: 6 %
Neutro Abs: 4.4 10*3/uL (ref 1.7–7.7)
Neutrophils Relative %: 64 %
PLATELETS: 287 10*3/uL (ref 150–400)
RBC: 4.31 MIL/uL (ref 4.22–5.81)
RDW: 14.5 % (ref 11.5–15.5)
WBC: 6.8 10*3/uL (ref 4.0–10.5)

## 2017-08-24 LAB — CBG MONITORING, ED: GLUCOSE-CAPILLARY: 182 mg/dL — AB (ref 65–99)

## 2017-08-24 LAB — BASIC METABOLIC PANEL
Anion gap: 8 (ref 5–15)
BUN: 17 mg/dL (ref 6–20)
CO2: 20 mmol/L — AB (ref 22–32)
CREATININE: 1.26 mg/dL — AB (ref 0.61–1.24)
Calcium: 9.2 mg/dL (ref 8.9–10.3)
Chloride: 112 mmol/L — ABNORMAL HIGH (ref 101–111)
GFR, EST NON AFRICAN AMERICAN: 60 mL/min — AB (ref >60)
Glucose, Bld: 173 mg/dL — ABNORMAL HIGH (ref 65–99)
Potassium: 5.4 mmol/L — ABNORMAL HIGH (ref 3.5–5.1)
SODIUM: 140 mmol/L (ref 135–145)

## 2017-08-24 LAB — TROPONIN I

## 2017-08-24 NOTE — ED Provider Notes (Signed)
[facility] EMERGENCY DEPARTMENT Provider Note   CSN: [id number] Arrival date & time: 08/24/17  1159     History   Chief Complaint Chief Complaint  Patient presents with  . Chest Pain    HPI Dylan Jordan is a 62 y.o. male.  HPI Patient presents with right-sided chest pain.  Woke up with the pain at around 830 this morning.  Continued pain.  States he has had this pain on and off for the last 2 years.  States he gets it is really just monthly.  States he has not been worked up for before.  Does not come on with exertion.  States today is last a little longer.  No shortness of breath.  No fevers.  It is both sharp and dull but states it is more dull and sharp.  Not worse with breathing.  No swelling in his legs.  He is a smoker.  No personal history of cardiac disease. Past Medical History:  Diagnosis Date  . Anemia   . Asthma   . Diabetes mellitus without complication (HCC)   . Gout   . Hyperlipidemia   . Hypertension     Patient Active Problem List   Diagnosis Date Noted  . Allergic rhinitis with a nonallergic component 07/30/2017  . Allergic conjunctivitis 07/30/2017  . Food allergy 07/30/2017  . C. difficile diarrhea 05/22/2017  . Gout 05/18/2017  . Hemorrhoids 05/04/2017  . Haloperidol adverse reaction 04/16/2017  . Chronic low back pain 01/03/2017  . Chronic cough 06/30/2016  . Drug-induced mood disorder (HCC) 06/14/2016  . Type 2 diabetes mellitus with diabetic neuropathy (HCC) 05/26/2016  . Iron deficiency anemia 05/26/2016  . Chronic kidney disease 05/26/2016  . HTN (hypertension) 05/26/2016  . Tobacco abuse 05/26/2016  . History of substance abuse 05/26/2016  . History of alcohol abuse 05/26/2016  . Mild persistent asthma 05/26/2016    Past Surgical History:  Procedure Laterality Date  . REPLACEMENT TOTAL KNEE Left   . TOE SURGERY          Home Medications    Prior to Admission medications   Medication Sig Start Date End Date  Taking? Authorizing Provider  ACCU-CHEK AVIVA PLUS test strip use as directed to check blood sugar THREE TIMES DAILY 07/02/17   Tillman Sers, DO  ACCU-CHEK SOFTCLIX LANCETS lancets use as directed *may use THREE TIMES DAILY to check blood sugar* 06/28/17   Tillman Sers, DO  acetaminophen (TYLENOL) 650 MG CR tablet Take 650 mg by mouth 2 (two) times daily as needed for pain.     [provider]  allopurinol (ZYLOPRIM) 100 MG tablet Take 2 tablets (200 mg total) by mouth daily. 06/25/17   Tillman Sers, DO  amLODipine (NORVASC) 5 MG tablet Take 1 TABLET BY MOUTH EVERY DAY 06/25/17   [name] C, DO  ammonium lactate (AMLACTIN) 12 % cream Apply topically as needed for dry skin. Patient taking differently: Apply 1 g topically as needed for dry skin.  07/26/16   Felecia Shelling, DPM  aspirin EC 81 MG tablet Take 1 tablet (81 mg total) by mouth daily. 06/07/17   Tillman Sers, DO  atorvastatin (LIPITOR) 20 MG tablet take 1 TABLET BY MOUTH EVERY DAY 01/22/17   [name] C, DO  azelastine (ASTELIN) 0.1 % nasal spray Place 1-2 sprays into both nostrils 2 (two) times daily as needed for rhinitis. 07/30/17   Bobbitt, Heywood Iles, MD  benzonatate (TESSALON) 100 MG capsule  Take 1 capsule (100 mg total) by mouth 2 (two) times daily as needed for cough. Patient not taking: Reported on 07/30/2017 06/22/17   Tillman Sers, DO  benztropine (COGENTIN) 2 MG tablet Take 2 mg by mouth 2 (two) times daily.    [provider]  cetirizine (ZYRTEC) 10 MG tablet Take 1 tablet (10 mg total) by mouth daily. Patient not taking: Reported on 07/30/2017 01/10/17   Tillman Sers, DO  cholecalciferol (VITAMIN D) 1000 units tablet Take 1 tablet (1,000 Units total) by mouth daily. 06/07/17   Tillman Sers, DO  Colchicine (MITIGARE) 0.6 MG CAPS Take 2 tablets once, then take 1 tablet one hour later. 07/05/17   Mayo, Allyn Kenner, MD  Cyanocobalamin (VITAMIN B-12) 6000 MCG SUBL Place 6,000 mcg under the  tongue daily.    [provider]  diclofenac sodium (VOLTAREN) 1 % GEL Apply 4 g topically 4 (four) times daily. 01/01/17   Mayo, Allyn Kenner, MD  divalproex (DEPAKOTE) 500 MG DR tablet Take 500-2,000 mg by mouth See admin instructions. Take 500mg  every morning and 2000 tabs every evening    [provider]  ferrous sulfate (FERROUSUL) 325 (65 FE) MG tablet Take 1 tablet (325 mg total) by mouth daily with breakfast. 01/10/17   Wonda Olds, Marcell Anger, DO  fluticasone (FLONASE) 50 MCG/ACT nasal spray Place 2 sprays into both nostrils daily. Patient taking differently: Place 2 sprays into both nostrils daily as needed for allergies.  12/05/16   Tillman Sers, DO  fluticasone (FLOVENT HFA) 110 MCG/ACT inhaler Inhale 2 puffs into the lungs 2 (two) times daily. 07/30/17   Bobbitt, Heywood Iles, MD  Fluticasone-Salmeterol (ADVAIR DISKUS) 250-50 MCG/DOSE AEPB Inhale 1 puff into the lungs 2 (two) times daily. Patient not taking: Reported on 07/30/2017 07/19/16   Tillman Sers, DO  gabapentin (NEURONTIN) 300 MG capsule Take 1 capsule (300 mg total) by mouth 3 (three) times daily as needed. 07/03/17   Tillman Sers, DO  glimepiride (AMARYL) 4 MG tablet Take 1 tablet (4 mg total) by mouth 2 (two) times daily 06/07/17   [name] C, DO  haloperidol (HALDOL) 10 MG tablet Take 5 mg by mouth every evening.     [provider]  haloperidol decanoate (HALDOL DECANOATE) 100 MG/ML injection Inject 400 mg into the muscle every 28 (twenty-eight) days.    [provider]  JARDIANCE 10 MG TABS tablet Take 10 mg by mouth daily. 07/03/17   Tillman Sers, DO  lithium carbonate 300 MG capsule Take 300 mg by mouth 2 (two) times daily with a meal.    [provider]  losartan (COZAAR) 25 MG tablet TAKE 1 TABLET BY MOUTH EVERY DAY Patient taking differently: TAKE 25mg  BY MOUTH EVERY DAY 10/03/16   Garth Bigness, MD  montelukast (SINGULAIR) 10 MG tablet take 1 TABLET BY MOUTH EVERY  DAY Patient taking differently: take 1 TABLET BY MOUTH EVERY EVENING 01/22/17   [name] C, DO  Multiple Vitamin (DAILY-VITE) TABS TAKE 1 TABLET BY MOUTH DAILY. 10/03/16   Garth Bigness, MD  nicotine (NICODERM CQ - DOSED IN MG/24 HOURS) 21 mg/24hr patch Place 1 patch (21 mg total) onto the skin daily. Patient not taking: Reported on 07/30/2017 02/16/17   Tillman Sers, DO  olopatadine (PATANOL) 0.1 % ophthalmic solution Place 1 drop into both eyes 2 (two) times daily as needed for allergies. 07/30/17   Bobbitt, Heywood Iles, MD  omeprazole (PRILOSEC) 20 MG capsule Take  2 capsules (40 mg total) by mouth every evening. 08/15/17   [name], [name], DO  phenylephrine-shark liver oil-mineral oil-petrolatum (PREPARATION H) 0.25-14-74.9 % rectal ointment Place 1 application rectally 2 (two) times daily as needed for hemorrhoids. Patient not taking: Reported on 07/30/2017 05/04/17   [name], [name], DO  vancomycin (VANCOCIN) 50 mg/mL oral solution Take 2.5 mLs (125 mg total) by mouth every 6 (six) hours. Patient not taking: Reported on 07/30/2017 05/23/17   Renae FickleShort, Mackenzie, MD    Family History Family History  Problem Relation Age of Onset  . Allergic rhinitis Neg Hx   . Angioedema Neg Hx   . Asthma Neg Hx   . Atopy Neg Hx   . Eczema Neg Hx   . Immunodeficiency Neg Hx   . Urticaria Neg Hx     Social History Social History   Tobacco Use  . Smoking status: Current Every Day Smoker    Packs/day: 0.50    Years: 45.00    Pack years: 22.50    Types: Cigarettes    Start date: 05/16/1971  . Smokeless tobacco: Never Used  Substance Use Topics  . Alcohol use: No  . Drug use: No     Allergies   Ativan [lorazepam] and Penicillins   Review of Systems Review of Systems  Constitutional: Negative for appetite change and fever.  HENT: Negative for congestion.   Respiratory: Negative for shortness of breath.   Cardiovascular: Positive for chest pain.  Gastrointestinal: Negative for  abdominal pain.  Genitourinary: Negative for frequency.  Musculoskeletal: Negative for back pain.  Skin: Negative for rash.  Neurological: Negative for weakness.  Hematological: Negative for adenopathy.  Psychiatric/Behavioral: Negative for confusion.     Physical Exam Updated Vital Signs BP 115/77   Pulse 78   Resp (!) 26   Ht 6\' 2"  (1.88 m)   Wt 103 kg (227 lb)   SpO2 96%   BMI 29.15 kg/m   Physical Exam  Constitutional: He appears well-developed.  HENT:  Head: Normocephalic.  Neck: Neck supple.  Cardiovascular: Normal rate and regular rhythm.  Pulmonary/Chest: Effort normal. He has no wheezes. He has no rales.  No chest tenderness  Abdominal: Soft. There is no tenderness.  Musculoskeletal:       Right lower leg: He exhibits no edema.       Left lower leg: He exhibits no edema.  Neurological: He is alert.  Chronically slurred speech  Skin: Skin is warm.  Psychiatric: He has a normal mood and affect.     ED Treatments / Results  Labs (all labs ordered are listed, but only abnormal results are displayed) Labs Reviewed  CBC WITH DIFFERENTIAL/PLATELET - Abnormal; Notable for the following components:      Result Value   Hemoglobin 12.4 (*)    All other components within normal limits  BASIC METABOLIC PANEL - Abnormal; Notable for the following components:   Potassium 5.4 (*)    Chloride 112 (*)    CO2 20 (*)    Glucose, Bld 173 (*)    Creatinine, Ser 1.26 (*)    GFR calc non Af Amer 60 (*)    All other components within normal limits  CBG MONITORING, ED - Abnormal; Notable for the following components:   Glucose-Capillary 182 (*)    All other components within normal limits  TROPONIN I    EKG EKG Interpretation  Date/Time:  Friday August 24 2017 12:09:00 EDT Ventricular Rate:  80 PR Interval:  QRS Duration: 82 QT Interval:  375 QTC Calculation: 433 R Axis:   -11 Text Interpretation:  Sinus rhythm Borderline low voltage, extremity leads Abnormal  R-wave progression, early transition lateral ST changes improved from prior. Confirmed by Benjiman Core [phone]) on 08/24/2017 12:16:14 PM   Radiology Dg Chest 2 View  Result Date: 08/24/2017 CLINICAL DATA:  Substernal chest pain beginning at 8:30 this morning. History of asthma, diabetes, hypertension. Current smoker. EXAM: CHEST - 2 VIEW COMPARISON:  Chest x-ray dated 05/20/2017. FINDINGS: The heart size and mediastinal contours are within normal limits. Both lungs are clear. The visualized skeletal structures are unremarkable. IMPRESSION: No active cardiopulmonary disease. Electronically Signed   By: Bary Richard M.D.   On: 08/24/2017 13:17    Procedures Procedures (including critical care time)  Medications Ordered in ED Medications - No data to display   Initial Impression / Assessment and Plan / ED Course  I have reviewed the triage vital signs and the nursing notes.  Pertinent labs & imaging results that were available during my care of the patient were reviewed by me and considered in my medical decision making (see chart for details).     Patient presents with chest pain.  Dull and sharp right-sided.  EKG and enzymes reassuring.  Not associated with exertion.  No known cardiac history but does have some risk factors.  Will have patient follow-up with PCP.  Discharge home.  Final Clinical Impressions(s) / ED Diagnoses   Final diagnoses:  Nonspecific chest pain    ED Discharge Orders    None       Benjiman Core, MD 08/24/17 1432

## 2017-08-24 NOTE — Discharge Instructions (Addendum)
Follow-up with your doctors next week as planned °

## 2017-08-24 NOTE — ED Triage Notes (Signed)
Per [facility] patient coming from home complaining of substernal chest pain that began at 830 am today. 12 lead unremarkable. 324 aspirin administered with ems. 1 nitro given with relief. EMS reports slurred speech that has been present for 2 years. Alert and oriented x 4.

## 2017-08-29 ENCOUNTER — Encounter: Payer: Self-pay | Admitting: Family Medicine

## 2017-08-29 ENCOUNTER — Other Ambulatory Visit: Payer: Self-pay

## 2017-08-29 ENCOUNTER — Ambulatory Visit (INDEPENDENT_AMBULATORY_CARE_PROVIDER_SITE_OTHER): Payer: Medicaid Other | Admitting: Family Medicine

## 2017-08-29 VITALS — BP 114/68 | HR 92 | Temp 98.0°F | Ht 74.0 in | Wt 225.0 lb

## 2017-08-29 DIAGNOSIS — Z1211 Encounter for screening for malignant neoplasm of colon: Secondary | ICD-10-CM | POA: Diagnosis not present

## 2017-08-29 DIAGNOSIS — E1140 Type 2 diabetes mellitus with diabetic neuropathy, unspecified: Secondary | ICD-10-CM | POA: Diagnosis present

## 2017-08-29 LAB — POCT GLYCOSYLATED HEMOGLOBIN (HGB A1C): Hemoglobin A1C: 7.4

## 2017-08-29 NOTE — Progress Notes (Signed)
    Subjective:    Patient ID: Dylan Jordan, male    DOB: 10-May-1956, 62 y.o.   MRN: [id number]   CC: follow up DM  DM- worried his A1C will be high today. He is taking medications as prescribed. He has not made many dietary changes. He is drinking three 20 oz sodas a day "minimum". Snacking on M&Ms [place] trail mix. CBGs have been in 100's mostly. He brought his meter for review. One high of 271 after eating mashed potatoes. One low sugar of 66- he felt badly. He ate peanut butter to get it back up.  He is currently taking: amaryl once a morning 4 mg, jardiance 10 mg daily, and Metformin 1000 bid but has loose stools 3x a day  HM- needs colonoscopy, eye exam, hep C, tetanus  Smoking status reviewed- current smoker  Review of Systems- denies increased thirst, hunger. Endorses increased urination.    Objective:  BP 114/68   Pulse 92   Temp 98 F (36.7 C) (Oral)   Ht 6\' 2"  (1.88 m)   Wt 225 lb (102.1 kg)   SpO2 94%   BMI 28.89 kg/m  Vitals and nursing note reviewed  General: well nourished, in no acute distress HEENT: normocephalic, MMM Cardiac: RRR, clear S1 and S2, no murmurs, rubs, or gallops Respiratory: clear to auscultation bilaterally, no increased work of breathing Skin: warm and dry, no rashes noted Neuro: alert and oriented, no focal deficits   Assessment & Plan:    1. Type 2 diabetes mellitus with diabetic neuropathy, without long-term current use of insulin (HCC) A1C today much improved from prior., 11.7 > 7.4. Patient's diet continues to be poor. He states he has no plans to change diet. In light of this I will not make many changes to regimen. Due to diarrhea will decrease metformin to 1000 mg at night. Continue amaryl and jardiance. Discussed cutting down on soda/candy if sugars high with decrease in metformin. Follow up 3 months.  - HgB A1c  2. Special screening for malignant neoplasms, colon Due for colonoscopy, referral made to GI. - Ambulatory  referral to Gastroenterology   Return in about 3 months (around 11/28/2017).   Dolores PattyAngela Kasson Lamere, DO Family Medicine Resident PGY-2

## 2017-08-29 NOTE — Patient Instructions (Addendum)
Let's cut down the metformin to 1000 mg just at night. If your sugars are persistently high PLEASE cut down on your soda intake and/or candy intake.  Please ask your eye doctor to send me a report!!  I have also referred you to GI for a colonoscopy. You will receive a call from our office to schedule this appointment. If you do not hear from our office in 1-2 weeks please call us to check on the status of your referral at [phone].  If you have questions or concerns please do not hesitate to call at [phone].  Dolores PattyAngela Suvi Archuletta, DO PGY-2,  Family Medicine 08/29/2017 2:44 PM   Diabetes Mellitus and Nutrition When you have diabetes (diabetes mellitus), it is very important to have healthy eating habits because your blood sugar (glucose) levels are greatly affected by what you eat and drink. Eating healthy foods in the appropriate amounts, at about the same times every day, can help you:  Control your blood glucose.  Lower your risk of heart disease.  Improve your blood pressure.  Reach or maintain a healthy weight.  Every person with diabetes is different, and each person has different needs for a meal plan. Your health care provider may recommend that you work with a diet and nutrition specialist (dietitian) to make a meal plan that is best for you. Your meal plan may vary depending on factors such as:  The calories you need.  The medicines you take.  Your weight.  Your blood glucose, blood pressure, and cholesterol levels.  Your activity level.  Other health conditions you have, such as heart or kidney disease.  How do carbohydrates affect me? Carbohydrates affect your blood glucose level more than any other type of food. Eating carbohydrates naturally increases the amount of glucose in your blood. Carbohydrate counting is a method for keeping track of how many carbohydrates you eat. Counting carbohydrates is important to keep your blood glucose at a healthy  level, especially if you use insulin or take certain oral diabetes medicines. It is important to know how many carbohydrates you can safely have in each meal. This is different for every person. Your dietitian can help you calculate how many carbohydrates you should have at each meal and for snack. Foods that contain carbohydrates include:  Bread, cereal, rice, pasta, and crackers.  Potatoes and corn.  Peas, beans, and lentils.  Milk and yogurt.  Fruit and juice.  Desserts, such as cakes, cookies, ice cream, and candy.  How does alcohol affect me? Alcohol can cause a sudden decrease in blood glucose (hypoglycemia), especially if you use insulin or take certain oral diabetes medicines. Hypoglycemia can be a life-threatening condition. Symptoms of hypoglycemia (sleepiness, dizziness, and confusion) are similar to symptoms of having too much alcohol. If your health care provider says that alcohol is safe for you, follow these guidelines:  Limit alcohol intake to no more than 1 drink per day for nonpregnant women and 2 drinks per day for men. One drink equals 12 oz of beer, 5 oz of wine, or 1 oz of hard liquor.  Do not drink on an empty stomach.  Keep yourself hydrated with water, diet soda, or unsweetened iced tea.  Keep in mind that regular soda, juice, and other mixers may contain a lot of sugar and must be counted as carbohydrates.  What are tips for following this plan? Reading food labels  Start by checking the serving size on the label. The amount of calories, carbohydrates, fats,  and other nutrients listed on the label are based on one serving of the food. Many foods contain more than one serving per package.  Check the total grams (g) of carbohydrates in one serving. You can calculate the number of servings of carbohydrates in one serving by dividing the total carbohydrates by 15. For example, if a food has 30 g of total carbohydrates, it would be equal to 2 servings of  carbohydrates.  Check the number of grams (g) of saturated and trans fats in one serving. Choose foods that have low or no amount of these fats.  Check the number of milligrams (mg) of sodium in one serving. Most people should limit total sodium intake to less than 2,300 mg per day.  Always check the nutrition information of foods labeled as "low-fat" or "nonfat". These foods may be higher in added sugar or refined carbohydrates and should be avoided.  Talk to your dietitian to identify your daily goals for nutrients listed on the label. Shopping  Avoid buying canned, premade, or processed foods. These foods tend to be high in fat, sodium, and added sugar.  Shop around the outside edge of the grocery store. This includes fresh fruits and vegetables, bulk grains, fresh meats, and fresh dairy. Cooking  Use low-heat cooking methods, such as baking, instead of high-heat cooking methods like deep frying.  Cook using healthy oils, such as olive, canola, or sunflower oil.  Avoid cooking with butter, cream, or high-fat meats. Meal planning  Eat meals and snacks regularly, preferably at the same times every day. Avoid going long periods of time without eating.  Eat foods high in fiber, such as fresh fruits, vegetables, beans, and whole grains. Talk to your dietitian about how many servings of carbohydrates you can eat at each meal.  Eat 4-6 ounces of lean protein each day, such as lean meat, chicken, fish, eggs, or tofu. 1 ounce is equal to 1 ounce of meat, chicken, or fish, 1 egg, or 1/4 cup of tofu.  Eat some foods each day that contain healthy fats, such as avocado, nuts, seeds, and fish. Lifestyle   Check your blood glucose regularly.  Exercise at least 30 minutes 5 or more days each week, or as told by your health care provider.  Take medicines as told by your health care provider.  Do not use any products that contain nicotine or tobacco, such as cigarettes and e-cigarettes. If  you need help quitting, ask your health care provider.  Work with a Veterinary surgeon or diabetes educator to identify strategies to manage stress and any emotional and social challenges. What are some questions to ask my health care provider?  Do I need to meet with a diabetes educator?  Do I need to meet with a dietitian?  What number can I call if I have questions?  When are the best times to check my blood glucose? Where to find more information:  American Diabetes Association: diabetes.org/food-and-fitness/food  Academy of Nutrition and Dietetics: https://www.vargas.com/  [employer] of Diabetes and Digestive and Kidney Diseases (NIH): FindJewelers.cz Summary  A healthy meal plan will help you control your blood glucose and maintain a healthy lifestyle.  Working with a diet and nutrition specialist (dietitian) can help you make a meal plan that is best for you.  Keep in mind that carbohydrates and alcohol have immediate effects on your blood glucose levels. It is important to count carbohydrates and to use alcohol carefully. This information is not intended to replace advice given  to you by your health care provider. Make sure you discuss any questions you have with your health care provider. Document Released: 01/26/2005 Document Revised: 06/05/2016 Document Reviewed: 06/05/2016 Elsevier Interactive Patient Education  [employer].

## 2017-08-31 ENCOUNTER — Encounter: Payer: Self-pay | Admitting: Family Medicine

## 2017-09-07 ENCOUNTER — Telehealth: Payer: Self-pay

## 2017-09-07 ENCOUNTER — Other Ambulatory Visit: Payer: Self-pay | Admitting: Family Medicine

## 2017-09-07 MED ORDER — COLCHICINE 0.6 MG PO CAPS: ORAL_CAPSULE | ORAL | 0 refills | Status: DC

## 2017-09-07 NOTE — Telephone Encounter (Signed)
Patient left message that he is having a gout flare up in foot x 3 days, extremely painful. Requests colchicine rx, enough that he has some on hand for when he has a flare-up, to [facility].  Call back is [phone].  Ples SpecterAlisa Rebeckah Masih, RN [facility] RN)

## 2017-09-10 NOTE — Telephone Encounter (Signed)
Will forward to MD. Geriann Lafont,CMA  

## 2017-09-10 NOTE — Telephone Encounter (Signed)
Pt calling for the 3rd time wanting to speak to someone about his goat flare up. He asked to speak to a nurse, transferred to nurse line and pt called right back because a nurse didn't answer. Pt then requested to speak to Dr Wonda Olds since he couldn't get a nurse. Pt is very frustrated he has not been contacted. Please contact pt about his goat. Please advise

## 2017-09-11 ENCOUNTER — Other Ambulatory Visit: Payer: Self-pay | Admitting: Family Medicine

## 2017-09-11 MED ORDER — COLCHICINE 0.6 MG PO CAPS: 0.6000 mg | ORAL_CAPSULE | Freq: Every day | ORAL | 1 refills | Status: DC

## 2017-09-11 NOTE — Telephone Encounter (Signed)
Sent in more colchicine to pharmacy.

## 2017-09-13 ENCOUNTER — Telehealth: Payer: Self-pay | Admitting: Internal Medicine

## 2017-09-13 NOTE — Telephone Encounter (Signed)
Copied from CRM [id number]. Topic: Appointment Scheduling - New Patient >> Sep 13, 2017 12:06 PM Alexander Bergeron B wrote: Pt called to ask if he can a new pt of Dr. Okey Dupre, call pt to advise

## 2017-09-13 NOTE — Telephone Encounter (Signed)
Dr. Crawford,  °Would you be willing to see this patient to establish care? °

## 2017-09-13 NOTE — Telephone Encounter (Signed)
Patient called back about this prescription. I made him aware that the medication had been called in. He had not been informed by this office or his pharmacy. I apologized for the inconvenience.   Ples Specter, RN [facility] RN)

## 2017-09-17 ENCOUNTER — Other Ambulatory Visit: Payer: Self-pay | Admitting: *Deleted

## 2017-09-17 MED ORDER — OMEPRAZOLE 20 MG PO CPDR: 40.0000 mg | DELAYED_RELEASE_CAPSULE | Freq: Every evening | ORAL | 2 refills | Status: DC

## 2017-09-19 NOTE — Telephone Encounter (Signed)
Not taking new patients now sorry but others in the office are.

## 2017-09-20 NOTE — Telephone Encounter (Signed)
Called patient and informed

## 2017-09-21 ENCOUNTER — Ambulatory Visit: Payer: Medicaid Other | Admitting: Podiatry

## 2017-09-24 ENCOUNTER — Telehealth: Payer: Self-pay

## 2017-09-24 DIAGNOSIS — E1165 Type 2 diabetes mellitus with hyperglycemia: Secondary | ICD-10-CM

## 2017-09-24 NOTE — Telephone Encounter (Signed)
Patient left long message on nurse line, seems to be asking for a referral for a diabetic eye exam and also needs new glasses.  Call back is [phone].  Ples Specter, RN [facility] RN)

## 2017-09-25 ENCOUNTER — Encounter: Payer: Self-pay | Admitting: Allergy and Immunology

## 2017-09-25 ENCOUNTER — Ambulatory Visit: Payer: Medicaid Other | Admitting: Allergy and Immunology

## 2017-09-25 VITALS — BP 110/80 | HR 85 | Resp 16 | Ht 74.0 in | Wt 226.0 lb

## 2017-09-25 DIAGNOSIS — J4530 Mild persistent asthma, uncomplicated: Secondary | ICD-10-CM

## 2017-09-25 DIAGNOSIS — J3089 Other allergic rhinitis: Secondary | ICD-10-CM | POA: Diagnosis not present

## 2017-09-25 NOTE — Telephone Encounter (Signed)
Please let him know

## 2017-09-25 NOTE — Progress Notes (Signed)
Follow-up Note  RE: Dylan Jordan MRN: [id number] DOB: 08/22/1955 Date of Office Visit: 09/25/2017  Primary care provider: Tillman Sers, DO Referring provider: Tillman Sers, DO  History of present illness: Dylan Jordan is a 62 y.o. male with mixed rhinitis (predominantly nonallergic) and mild persistent asthma presenting today for follow-up.  He was previously seen in this clinic for his initial evaluation on July 30, 2017.  He reports that in the interval since his previous visit he has rarely required azelastine nasal spray.  He has been taking fluticasone 110 g, 2 inhalations twice daily.  He admits that he has not been using a spacer device with HFA inhaler.  He has rarely required albuterol rescue in the interval since his previous visit.  He has no new complaints today.  Assessment and plan: Allergic rhinitis with a nonallergic component Stable. Continue appropriate allergen avoidance measures, azelastine 1 spray per nostril 1-2 times daily if needed, and nasal saline spray as needed.  Mild persistent asthma  Continue Flovent 110 g, 2 inhalations twice daily, and albuterol HFA, 1 to 2 inhalations every 6 hours if needed.  To maximize pulmonary deposition, the consistent use of a spacer device has been encouraged.  Subjective and objective measures of pulmonary function will be followed and the treatment plan will be adjusted accordingly.   Diagnostics: Spirometry:  Normal with an FEV1 of 81% predicted and an FEV1 ratio of 97%.  Please see scanned spirometry results for details.    Physical examination: Blood pressure 110/80, pulse 85, resp. rate 16, height  (1.88 m), weight 226 lb (102.5 kg), SpO2 97 %.  General: Alert, interactive, in no acute distress. HEENT: TMs pearly gray, turbinates mildly edematous without discharge, post-pharynx unremarkable. Neck: Supple without lymphadenopathy. Lungs: Clear to auscultation without wheezing, rhonchi or  rales. CV: Normal S1, S2 without murmurs. Skin: Warm and dry, without lesions or rashes.  The following portions of the patient's history were reviewed and updated as appropriate: allergies, current medications, past family history, past medical history, past social history, past surgical history and problem list.  Allergies as of 09/25/2017      Reactions   Ativan [lorazepam] Other (See Comments)   Agitation (and "5 others like it") Will need to call [facility] [place] (phone) [phone] and [phone] to fax a release-   Penicillins Diarrhea   Has patient had a PCN reaction causing immediate rash, facial/tongue/throat swelling, SOB or lightheadedness with hypotension: No Has patient had a PCN reaction causing severe rash involving mucus membranes or skin necrosis: No Has patient had a PCN reaction that required hospitalization: No Has patient had a PCN reaction occurring within the last 10 years: No If all of the above answers are "NO", then may proceed with Cephalosporin use.      Medication List        Accurate as of 09/25/17  4:00 PM. Always use your most recent med list.          ACCU-CHEK AVIVA PLUS test strip Generic drug:  glucose blood use as directed to check blood sugar THREE TIMES DAILY   ACCU-CHEK SOFTCLIX LANCETS lancets use as directed *may use THREE TIMES DAILY to check blood sugar*   acetaminophen 650 MG CR tablet Commonly known as:  TYLENOL Take 650 mg by mouth 2 (two) times daily as needed for pain.   allopurinol 100 MG tablet Commonly known as:  ZYLOPRIM Take 2 tablets (200 mg total) by mouth daily.  amLODipine 5 MG tablet Commonly known as:  NORVASC Take 1 TABLET BY MOUTH EVERY DAY   ammonium lactate 12 % cream Commonly known as:  AMLACTIN Apply topically as needed for dry skin.   aspirin EC 81 MG tablet Take 1 tablet (81 mg total) by mouth daily.   atorvastatin 20 MG tablet Commonly known as:  LIPITOR take 1 TABLET BY  MOUTH EVERY DAY   azelastine 0.1 % nasal spray Commonly known as:  ASTELIN Place 1-2 sprays into both nostrils 2 (two) times daily as needed for rhinitis.   benzonatate 100 MG capsule Commonly known as:  TESSALON Take 1 capsule (100 mg total) by mouth 2 (two) times daily as needed for cough.   benztropine 2 MG tablet Commonly known as:  COGENTIN Take 2 mg by mouth 2 (two) times daily.   cetirizine 10 MG tablet Commonly known as:  ZYRTEC Take 1 tablet (10 mg total) by mouth daily.   cholecalciferol 1000 units tablet Commonly known as:  VITAMIN D Take 1 tablet (1,000 Units total) by mouth daily.   Colchicine 0.6 MG Caps Commonly known as:  MITIGARE Take 0.6 mg by mouth daily. Take 2 tablets once, then take 1 tablet one hour later.   DAILY-VITE Tabs TAKE 1 TABLET BY MOUTH DAILY.   diclofenac sodium 1 % Gel Commonly known as:  VOLTAREN Apply 4 g topically 4 (four) times daily.   divalproex 500 MG DR tablet Commonly known as:  DEPAKOTE Take 500-2,000 mg by mouth See admin instructions. Take  every morning and 2000 tabs every evening   ferrous sulfate 325 (65 FE) MG tablet Commonly known as:  FERROUSUL Take 1 tablet (325 mg total) by mouth daily with breakfast.   fluticasone 110 MCG/ACT inhaler Commonly known as:  FLOVENT HFA Inhale 2 puffs into the lungs 2 (two) times daily.   fluticasone 50 MCG/ACT nasal spray Commonly known as:  FLONASE Place 2 sprays into both nostrils daily.   Fluticasone-Salmeterol 250-50 MCG/DOSE Aepb Commonly known as:  ADVAIR DISKUS Inhale 1 puff into the lungs 2 (two) times daily.   gabapentin 300 MG capsule Commonly known as:  NEURONTIN Take 1 capsule (300 mg total) by mouth 3 (three) times daily as needed.   glimepiride 4 MG tablet Commonly known as:  AMARYL Take 1 tablet (4 mg total) by mouth 2 (two) times daily   haloperidol 10 MG tablet Commonly known as:  HALDOL Take 5 mg by mouth every evening.   haloperidol decanoate  100 MG/ML injection Commonly known as:  HALDOL DECANOATE Inject 400 mg into the muscle every 28 (twenty-eight) days.   JARDIANCE 10 MG Tabs tablet Generic drug:  empagliflozin Take 10 mg by mouth daily.   lithium carbonate 300 MG capsule Take 300 mg by mouth 2 (two) times daily with a meal.   losartan 25 MG tablet Commonly known as:  COZAAR TAKE 1 TABLET BY MOUTH EVERY DAY   montelukast 10 MG tablet Commonly known as:  SINGULAIR take 1 TABLET BY MOUTH EVERY DAY   nicotine 21 mg/24hr patch Commonly known as:  NICODERM CQ - dosed in mg/24 hours Place 1 patch (21 mg total) onto the skin daily.   olopatadine 0.1 % ophthalmic solution Commonly known as:  PATANOL Place 1 drop into both eyes 2 (two) times daily as needed for allergies.   omeprazole 20 MG capsule Commonly known as:  PRILOSEC Take 2 capsules (40 mg total) by mouth every evening.   phenylephrine-shark liver oil-mineral  oil-petrolatum 0.25-14-74.9 % rectal ointment Commonly known as:  PREPARATION H Place 1 application rectally 2 (two) times daily as needed for hemorrhoids.   vancomycin 50 mg/mL oral solution Commonly known as:  VANCOCIN Take 2.5 mLs (125 mg total) by mouth every 6 (six) hours.   Vitamin B-12 6000 MCG Subl Place 6,000 mcg under the tongue daily.       Allergies  Allergen Reactions  . Ativan [Lorazepam] Other (See Comments)    Agitation (and "5 others like it") Will need to call [facility] [place] (phone) [phone] and [phone] to fax a release-   . Penicillins Diarrhea    Has patient had a PCN reaction causing immediate rash, facial/tongue/throat swelling, SOB or lightheadedness with hypotension: No Has patient had a PCN reaction causing severe rash involving mucus membranes or skin necrosis: No Has patient had a PCN reaction that required hospitalization: No Has patient had a PCN reaction occurring within the last 10 years: No If all of the above answers are "NO",  then may proceed with Cephalosporin use.     I appreciate the opportunity to take part in Carmeron's care. Please do not hesitate to contact me with questions.  Sincerely,   R. Jorene Guest, MD

## 2017-09-25 NOTE — Assessment & Plan Note (Signed)
   Continue Flovent 110 g, 2 inhalations twice daily, and albuterol HFA, 1 to 2 inhalations every 6 hours if needed.  To maximize pulmonary deposition, the consistent use of a spacer device has been encouraged.  Subjective and objective measures of pulmonary function will be followed and the treatment plan will be adjusted accordingly.

## 2017-09-25 NOTE — Telephone Encounter (Signed)
Referral to ophthalmology placed.  Dylan Patty, DO PGY-2, [facility] 09/25/2017 9:37 AM

## 2017-09-25 NOTE — Assessment & Plan Note (Signed)
Stable. Continue appropriate allergen avoidance measures, azelastine 1 spray per nostril 1-2 times daily if needed, and nasal saline spray as needed.

## 2017-09-25 NOTE — Patient Instructions (Signed)
Allergic rhinitis with a nonallergic component Stable. Continue appropriate allergen avoidance measures, azelastine 1 spray per nostril 1-2 times daily if needed, and nasal saline spray as needed.  Mild persistent asthma  Continue Flovent 110 g, 2 inhalations twice daily, and albuterol HFA, 1 to 2 inhalations every 6 hours if needed.  To maximize pulmonary deposition, the consistent use of a spacer device has been encouraged.  Subjective and objective measures of pulmonary function will be followed and the treatment plan will be adjusted accordingly.   Return in about 6 months (around 03/28/2018), or if symptoms worsen or fail to improve.

## 2017-09-26 NOTE — Addendum Note (Signed)
Addended by: Dub Mikes on: 09/26/2017 08:31 AM   Modules accepted: Orders

## 2017-09-28 ENCOUNTER — Ambulatory Visit: Payer: Medicaid Other | Admitting: Podiatry

## 2017-09-28 ENCOUNTER — Encounter: Payer: Self-pay | Admitting: Podiatry

## 2017-09-28 DIAGNOSIS — E1142 Type 2 diabetes mellitus with diabetic polyneuropathy: Secondary | ICD-10-CM

## 2017-09-28 DIAGNOSIS — L84 Corns and callosities: Secondary | ICD-10-CM

## 2017-09-28 DIAGNOSIS — B351 Tinea unguium: Secondary | ICD-10-CM

## 2017-09-28 DIAGNOSIS — M79609 Pain in unspecified limb: Principal | ICD-10-CM

## 2017-09-28 NOTE — Progress Notes (Signed)
Complaint:  Visit Type: Patient returns to my office for continued preventative foot care services. Complaint: Patient states" my nails have grown long and thick and become painful to walk and wear shoes" The patient presents for preventative foot care services. No changes to ROS.    Podiatric Exam: Vascular: dorsalis pedis and posterior tibial pulses are palpable bilateral. Capillary return is immediate. Temperature gradient is WNL. Skin turgor WNL  Sensorium: Normal Semmes Weinstein monofilament test. Normal tactile sensation bilaterally. Nail Exam: Pt has thick disfigured discolored nails with subungual debris noted bilateral entire nail hallux through fifth toenails Ulcer Exam: There is no evidence of ulcer or pre-ulcerative changes or infection. Orthopedic Exam: Muscle tone and strength are WNL. No limitations in general ROM. No crepitus or effusions noted. HAV  B/L. Skin: No Porokeratosis. No infection or ulcers. Asymptomatic   [place] durum fifth left.  Diagnosis:  Onychomycosis, , Pain in right toe, pain in left toes,     Treatment & Plan Procedures and Treatment: Consent by patient was obtained for treatment procedures. The patient understood the discussion of treatment and procedures well. All questions were answered thoroughly reviewed. Debridement of mycotic and hypertrophic toenails, 1 through 5 bilateral and clearing of subungual debris. No ulceration, no infection noted.  RTC 5 weeks. Return Visit-Office Procedure: Patient instructed to return to the office for a follow up visit when scheduled  for continued evaluation and treatment.    Georgi Tuel DPM 

## 2017-10-09 ENCOUNTER — Telehealth: Payer: Self-pay

## 2017-10-09 NOTE — Telephone Encounter (Signed)
Returned call to Dylan Jordan, his sugars have been high since stopping metformin in morning due to excessive diarrhea during the day. Taking glipizide and jardiance still. Taking metformin at night still 1000 mg. Has noticed fasting sugars are in 200's, around 240. He has cut down on sodas, mostly due to not having pocket change. He took 2 metformin this morning and was going to take 2 in the evening. He had diarrhea today that was "pretty bad". We'll set him up with Dr. Raymondo Band to discuss other options. He would prefer to not do insulin.   I'll see him in July for 3 mo check up [name], DO PGY-2, [facility] 10/09/2017 4:27 PM

## 2017-10-09 NOTE — Telephone Encounter (Signed)
Please set up with Dr. Raymondo Band for diabetic management. Ples Specter, RN [facility] RN)

## 2017-10-09 NOTE — Telephone Encounter (Signed)
Patient left message on nurse line asking for return call from PCP.  States since stopping AM dose of Metformin his morning CBGs have been in the 200s. States he has been working on his diet and it has been good. Concerned about his readings being this high off his morning dose of Metformin.  Patient has started taking the AM dose again because of this but would like to discuss this with PCP. States he is having diarrhea so maybe there is an alternative?  Call back is [phone].  Ples Specter, RN [facility] RN)

## 2017-10-11 NOTE — Telephone Encounter (Signed)
I called Mr Siefker and scheduled an appointment with Dr Raymondo Band on 10/18/17 at 1:30.

## 2017-10-15 ENCOUNTER — Telehealth: Payer: Self-pay

## 2017-10-15 NOTE — Telephone Encounter (Signed)
Pt left message on nurse line that he is still having problems with his gout, even after medication was increased. Has appt this week with Dr. Raymondo BandKoval, but wanting to know if his medication can be adjusted again. Pt call back [phone] Shawna OrleansMeredith B Paislei Dorval, RN

## 2017-10-17 ENCOUNTER — Telehealth: Payer: Self-pay

## 2017-10-17 MED ORDER — COLCHICINE 0.6 MG PO CAPS: 0.6000 mg | ORAL_CAPSULE | Freq: Every day | ORAL | 1 refills | Status: DC

## 2017-10-17 NOTE — Telephone Encounter (Signed)
Patient has an appointment tomorrow. Miguelina Fore,CMA

## 2017-10-17 NOTE — Telephone Encounter (Signed)
I am covering Dr. Purnell Shoemakericcio's inbox. He is welcome to get a same day appt here for his gout, otherwise he can wait until his appt with Dr. Raymondo BandKoval.

## 2017-10-17 NOTE — Telephone Encounter (Signed)
Pharmacist from [facility] needs clarification on directions for [employer]. Appear to have two sets of instructions.  Call back is [phone]  Ples SpecterAlisa Brake, RN [facility] RN)

## 2017-10-18 ENCOUNTER — Ambulatory Visit: Payer: Medicaid Other | Admitting: Pharmacist

## 2017-10-18 ENCOUNTER — Telehealth: Payer: Self-pay | Admitting: Family Medicine

## 2017-10-18 ENCOUNTER — Encounter: Payer: Self-pay | Admitting: Pharmacist

## 2017-10-18 DIAGNOSIS — Z720 Tobacco use: Secondary | ICD-10-CM

## 2017-10-18 DIAGNOSIS — E1140 Type 2 diabetes mellitus with diabetic neuropathy, unspecified: Secondary | ICD-10-CM | POA: Diagnosis present

## 2017-10-18 MED ORDER — GLIMEPIRIDE 4 MG PO TABS: 4.0000 mg | ORAL_TABLET | Freq: Every day | ORAL | 3 refills | Status: DC

## 2017-10-18 MED ORDER — METFORMIN HCL ER 750 MG PO TB24
750.0000 mg | ORAL_TABLET | Freq: Two times a day (BID) | ORAL | 6 refills | Status: DC
Start: 2017-10-18 — End: 2018-01-03

## 2017-10-18 MED ORDER — NICOTINE POLACRILEX 4 MG MT LOZG: 4.0000 mg | LOZENGE | OROMUCOSAL | 2 refills | Status: DC | PRN

## 2017-10-18 MED ORDER — EMPAGLIFLOZIN 25 MG PO TABS: 25.0000 mg | ORAL_TABLET | Freq: Every day | ORAL | 6 refills | Status: DC

## 2017-10-18 NOTE — Telephone Encounter (Signed)
Patient came to office for an appt. With Koval. Patient wants to know if the RX Allopurino get be increased due to not helping with gout attacks. Please call patient and let him know.

## 2017-10-18 NOTE — Progress Notes (Signed)
S:     Chief Complaint  Patient presents with  . Medication Management    diabetes    Patient arrives today for discussion of diabetes management.  Presents for diabetes evaluation, education, and management at the request of Dr. Wonda Oldsiccio. Patient was referred on 08/29/17.  Patient was last seen by Primary Care Provider on 08/29/2017.   Reports tingling pain in his feet, requests that we increase allopurinol to help. Reports he is taking gabapentin 300 mg BID instead of TID. Reports he restarted metformin 1000 mg BID with food since he saw some higher CBG readings. Endorses frequent diarrhea with this dose, worsened from previous.   Patient reports Diabetes was diagnosed a few years ago.   Family/Social History: non-contributory   Insurance coverage/medication affordability: Medicaid  Patient reports adherence with medications.  Current diabetes medications include: Metformin 1000mg  BID, empagliflozin 10mg , and glimepiride 4mg  BID. Current hypertension medications include: amlodipine 5mg , losartan 25mg   Patient reports hypoglycemic events every few months, and he takes 4-5 life-savers when this occurs. Counseled him to chew on these instead of sucking on them.  Patient reported dietary habits: Eats 3 meals/day Breakfast: milk/cereal - Rice Krispies Lunch: sandwich ([place]key and cheese on honey wheat) Dinner: largest meal of the day - mostly fried chicken thighs and broccoli Drinks:4-5 sodas per day, water  Patient reports nocturia, 3 times nightly.  Smoking: Brand smoked unknown. Number of cigarettes/day 7-10, but sometimes can go days without using one. Estimated nicotine content per cigarette, 1mg .  Estimated nicotine intake per day 7-10mg .   Smokes a cigarette first thing in the morning <30 minutes after waking.  Most recent quit attempt - on and off throughout the past few months, sometimes goes 3-4 days without smoking.   Medications used in past cessation efforts include:  NRT (nicorette gum, patches), varenicline (intolerance due to nightmares)  Rates IMPORTANCE of quitting tobacco on 1-10 scale of 10. Rates CONFIDENCE of quitting tobacco on 1-10 scale of 7. Plans to try to quit by the end of June.   Most common triggers to use tobacco include: habitual for him to have one when he first wakes up.   Motivation to quit: knows that it is bad for health.   O:  Physical Exam  Constitutional: He appears well-developed and well-nourished.     Review of Systems  Gastrointestinal: Negative for nausea and vomiting.  Neurological: Positive for tingling (in feet bilaterally).  Uric acid 4.9 on allopurinol 200mg     Lab Results  Component Value Date   HGBA1C 7.4 08/29/2017   Vitals:   10/18/17 1341  BP: 124/82  Pulse: (!) 104  SpO2: 98%    Home fasting CBG: 100-170s. Postprandials in mid-to-high 200s  Clinical ASCVD: No   A/P: Diabetes longstanding for many years currently uncontrolled on current regimen of metformin, empagliflozin, and glimepiride. Patient is able to verbalize appropriate hypoglycemia management plan, and . Patient is adherent with medication. Control is suboptimal due to dietary indiscretion, insulin resistance. - Changed metformin 1000mg  BID to metformin ER 750mg  once daily due to diarrhea and intolerances with current regimen. Plan to increase to twice daily in two weeks if able to tolerate. Counseled to take with food. - Decrease glimepiride from 4 mg BID to 4 mg once daily. -Increased dose of SGLT2-I Jardiance (generic name empagliflozin) from 10 mg to 25 mg daily. -Recommended lifestyle interventions, dietary effects on glycemic control, and spoke to him about importance of not drinking sodas. -Counseled on s/sx of and  management of hypoglycemia -Next A1C anticipated after 11/28/2017.  Hypertension longstanding currently controlled on current BP medications.  BP goal = <130//80 mmHg. Patient is adherent with  medications. 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    -Initiated nicotine replacement tx with nicotine lozenges 4mg . Patient counseled on purpose, proper use, and potential adverse effects, including nausea if swallowing too much nicotine while using the lozenge.  Written patient instructions provided.  Total time in face to face counseling 45 minutes.   Follow up Pharmacist/PCP at next Clinic Visit in 4-5 weeks.   Patient seen with Donnella Bi, PharmD, PGY1 Pharmacy Resident and Devota Pace, PharmD, BCPS, PGY2 Pharmacy Resident.

## 2017-10-18 NOTE — Telephone Encounter (Signed)
Returned call to pharmacy, clarified colchicine dosing as 2 tablets, then 1 tablet 1 hour later for gout flare, daily as needed after that.

## 2017-10-18 NOTE — Patient Instructions (Addendum)
Good to meet you!   1. Start nicotine lozenge 4 mg whenever you would have smoked a cigarette. Goal is to be completely free of cigarettes by the end of June.   2. Stop taking metformin 500 mg tablets. We are changing you to metformin ER 750 mg. Take 1 pill once a day for 2 weeks. If you are not having diarrhea after 2 weeks, increase to 1 pill twice a day. Make sure you take this with food.   3. Increase your Jardiance to 25 mg once a day when you finish off the 10 mg pills.   4. Decrease your glimepiride to 4 mg once a day  Come back to see us in 4 weeks.

## 2017-10-18 NOTE — Assessment & Plan Note (Signed)
Diabetes longstanding for many years currently uncontrolled on current regimen of metformin, empagliflozin, and glimepiride. Patient is able to verbalize appropriate hypoglycemia management plan, and . Patient is adherent with medication. Control is suboptimal due to dietary indiscretion, insulin resistance. - Changed metformin 1000mg  BID to metformin ER 750mg  once daily due to diarrhea and intolerances with current regimen. Plan to increase to twice daily in two weeks if able to tolerate. Counseled to take with food. - Decrease glimepiride from 4 mg BID to 4 mg once daily. -Increased dose of SGLT2-I Jardiance (generic name empagliflozin) from 10 mg to 25 mg daily. -Recommended lifestyle interventions, dietary effects on glycemic control, and spoke to him about importance of not drinking sodas. -Counseled on s/sx of and management of hypoglycemia -Next A1C anticipated after 11/28/2017.  Hypertension longstanding currently controlled on current BP medications.  BP goal = <130//80 mmHg. Patient is adherent with medications. -Continue losartan 25mg  and amlodipine 5mg 

## 2017-10-18 NOTE — Telephone Encounter (Signed)
Pharmacy addressed this with patient while he was in clinic today.  Jazmin Hartsell,CMA

## 2017-10-18 NOTE — Assessment & Plan Note (Signed)
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    -Initiated nicotine replacement tx with nicotine lozenges 4mg . Patient counseled on purpose, proper use, and potential adverse effects, including nausea if swallowing too much nicotine while using the lozenge.

## 2017-10-19 NOTE — Progress Notes (Signed)
Patient ID: Dylan Jordan, male   DOB: 02/01/1956, 62 y.o.   MRN: [id number] Reviewed: Agree with Dr. Koval's documentation and management. 

## 2017-10-22 ENCOUNTER — Other Ambulatory Visit: Payer: Self-pay

## 2017-10-23 ENCOUNTER — Ambulatory Visit: Payer: Medicaid Other | Admitting: Allergy and Immunology

## 2017-10-23 MED ORDER — GABAPENTIN 300 MG PO CAPS: 300.0000 mg | ORAL_CAPSULE | Freq: Three times a day (TID) | ORAL | 1 refills | Status: DC | PRN

## 2017-10-26 ENCOUNTER — Emergency Department (HOSPITAL_COMMUNITY)
Admission: EM | Admit: 2017-10-26 | Discharge: 2017-10-26 | Disposition: A | Discharge status: Home / Self Care | Payer: Medicaid Other | Attending: Emergency Medicine | Admitting: Emergency Medicine

## 2017-10-26 ENCOUNTER — Encounter (HOSPITAL_COMMUNITY): Payer: Self-pay | Admitting: Emergency Medicine

## 2017-10-26 DIAGNOSIS — I129 Hypertensive chronic kidney disease with stage 1 through stage 4 chronic kidney disease, or unspecified chronic kidney disease: Secondary | ICD-10-CM | POA: Insufficient documentation

## 2017-10-26 DIAGNOSIS — Z79899 Other long term (current) drug therapy: Secondary | ICD-10-CM | POA: Insufficient documentation

## 2017-10-26 DIAGNOSIS — N189 Chronic kidney disease, unspecified: Secondary | ICD-10-CM | POA: Diagnosis not present

## 2017-10-26 DIAGNOSIS — Z7984 Long term (current) use of oral hypoglycemic drugs: Secondary | ICD-10-CM | POA: Insufficient documentation

## 2017-10-26 DIAGNOSIS — E1122 Type 2 diabetes mellitus with diabetic chronic kidney disease: Secondary | ICD-10-CM | POA: Diagnosis not present

## 2017-10-26 DIAGNOSIS — Y638 Failure in dosage during other surgical and medical care: Secondary | ICD-10-CM | POA: Insufficient documentation

## 2017-10-26 DIAGNOSIS — F17210 Nicotine dependence, cigarettes, uncomplicated: Secondary | ICD-10-CM | POA: Insufficient documentation

## 2017-10-26 DIAGNOSIS — T50901A Poisoning by unspecified drugs, medicaments and biological substances, accidental (unintentional), initial encounter: Secondary | ICD-10-CM | POA: Diagnosis not present

## 2017-10-26 DIAGNOSIS — Z96652 Presence of left artificial knee joint: Secondary | ICD-10-CM | POA: Diagnosis not present

## 2017-10-26 DIAGNOSIS — J45909 Unspecified asthma, uncomplicated: Secondary | ICD-10-CM | POA: Diagnosis not present

## 2017-10-26 LAB — BASIC METABOLIC PANEL
Anion gap: 7 (ref 5–15)
BUN: 16 mg/dL (ref 6–20)
CO2: 26 mmol/L (ref 22–32)
Calcium: 9.1 mg/dL (ref 8.9–10.3)
Chloride: 108 mmol/L (ref 101–111)
Creatinine, Ser: 1.38 mg/dL — ABNORMAL HIGH (ref 0.61–1.24)
GFR calc Af Amer: >60 mL/min (ref >60)
GFR calc non Af Amer: 54 mL/min — ABNORMAL LOW (ref >60)
Glucose, Bld: 240 mg/dL — ABNORMAL HIGH (ref 65–99)
Potassium: 5.2 mmol/L — ABNORMAL HIGH (ref 3.5–5.1)
Sodium: 141 mmol/L (ref 135–145)

## 2017-10-26 LAB — CBC WITH DIFFERENTIAL/PLATELET
Basophils Absolute: 0 10*3/uL (ref 0.0–0.1)
Basophils Relative: 0 %
Eosinophils Absolute: 0.4 10*3/uL (ref 0.0–0.7)
Eosinophils Relative: 8 %
HCT: 41.9 % (ref 39.0–52.0)
Hemoglobin: 12.7 g/dL — ABNORMAL LOW (ref 13.0–17.0)
Lymphocytes Relative: 38 %
Lymphs Abs: 1.9 10*3/uL (ref 0.7–4.0)
MCH: 28.4 pg (ref 26.0–34.0)
MCHC: 30.3 g/dL (ref 30.0–36.0)
MCV: 93.7 fL (ref 78.0–100.0)
Monocytes Absolute: 0.3 10*3/uL (ref 0.1–1.0)
Monocytes Relative: 6 %
Neutro Abs: 2.4 10*3/uL (ref 1.7–7.7)
Neutrophils Relative %: 48 %
Platelets: 196 10*3/uL (ref 150–400)
RBC: 4.47 MIL/uL (ref 4.22–5.81)
RDW: 15.3 % (ref 11.5–15.5)
WBC: 5.1 10*3/uL (ref 4.0–10.5)

## 2017-10-26 LAB — CBG MONITORING, ED: Glucose-Capillary: 228 mg/dL — ABNORMAL HIGH (ref 65–99)

## 2017-10-26 NOTE — Discharge Instructions (Signed)
Continue to take your medications as prescribed.  Do not take too much of your medicines.  Call your primary care physician's office and let them know that you accidentally took too much medicine today.  They may want to see you in their office for follow-up.  Return to the emergency department if any concerning signs or symptoms develop such as very low blood sugar, confusion, passing out, decreased urine output, or fevers

## 2017-10-26 NOTE — Progress Notes (Addendum)
[name] received a call from pt's RN stating pt is refusing to D/C with a bus pass and that a bus pass is insufficient for the pt's needs.  [name] confirmed with pt's RN, pt can ambulate without difficulty.  [name] will inform the pt of [facility] ED policy at this time.  [name] met with pt and provided pt with a bus pass and detailed instructions on how to get to his home using the bus routes, as well as a printed copy of the pt's route.  Pt voiced understanding and was appreciative and thanked the [name].  Please reconsult if future social work needs arise.  [name] signing off, as social work intervention is no longer needed.  Dylan Jordan. Areya Lemmerman, LCSW, LCAS, CSI Clinical Social Worker Ph: [phone]

## 2017-10-26 NOTE — ED Triage Notes (Signed)
Per EMS-states recently had an increase in his Jardiance from 10 mg to 25 mg-took 2 at 0930 thinking it was another of his medications-CBG 212, initial was 227-asymptomatic

## 2017-10-26 NOTE — ED Notes (Signed)
Secretary made aware to order pt carb mod diet meal tray.  Patient informed that meal tray been ordered so he can have that or can have  More snacks but not both. Patient wants to wait for meal tray.  Pt given more orange juice,.

## 2017-10-26 NOTE — ED Notes (Signed)
Pt states he has no friends, and no family in the area to take him home.  And he states he has no money for a cab.  Pt refused bus pass to get home.  This nurse contacted social work to see if he has any other resources to get pt home.

## 2017-10-26 NOTE — ED Provider Notes (Signed)
[facility] Note   CSN: [id number] Arrival date & time: 10/26/17  1143   History   Chief Complaint Chief Complaint  Patient presents with  . took too many DM meds    HPI Tryone Kille is a 62 y.o. male with history of anemia, asthma, type 2 diabetes, hyperlipidemia, hypertension, and gout is for evaluation after accidentally taking too much of his diabetes medication.  He states that at around 9:30 AM this morning he took 2 tablets of his Jardiance 25 mg thinking it was a another medicine.  This medicine was recently increased from 10 mg to 25 mg daily on 10/18/2017 when he followed up with his PCP.  He states that when he realized his mistake he called his pharmacist who advised him of possible side effects.  He states that that time he felt somewhat confused.  She recommended calling EMS to present to the ED which he did.  At this time he denies any confusion, headache, chest pain, shortness of breath.  He notes polyuria but states this is chronic and unchanged.  He denies abdominal pain, nausea, vomiting.  He did take his blood sugar at home which was 212.  He denies any complaints at this time.  The history is provided by the patient.    Past Medical History:  Diagnosis Date  . Anemia   . Asthma   . Diabetes mellitus without complication (HCC)   . Gout   . Hyperlipidemia   . Hypertension     Patient Active Problem List   Diagnosis Date Noted  . Allergic rhinitis with a nonallergic component 07/30/2017  . Allergic conjunctivitis 07/30/2017  . Food allergy 07/30/2017  . C. difficile diarrhea 05/22/2017  . Gout 05/18/2017  . Hemorrhoids 05/04/2017  . Haloperidol adverse reaction 04/16/2017  . Chronic low back pain 01/03/2017  . Chronic cough 06/30/2016  . Drug-induced mood disorder (HCC) 06/14/2016  . Type 2 diabetes mellitus with diabetic neuropathy (HCC) 05/26/2016  . Iron deficiency anemia 05/26/2016  . Chronic kidney disease  05/26/2016  . HTN (hypertension) 05/26/2016  . Tobacco abuse 05/26/2016  . History of substance abuse 05/26/2016  . History of alcohol abuse 05/26/2016  . Mild persistent asthma 05/26/2016    Past Surgical History:  Procedure Laterality Date  . REPLACEMENT TOTAL KNEE Left   . TOE SURGERY          Home Medications    Prior to Admission medications   Medication Sig Start Date End Date Taking? Authorizing Provider  allopurinol (ZYLOPRIM) 100 MG tablet Take 2 tablets (200 mg total) by mouth daily. 06/25/17  Yes [name], [name], DO  amLODipine (NORVASC) 5 MG tablet Take 1 TABLET BY MOUTH EVERY DAY 06/25/17  Yes [name], [name], DO  atorvastatin (LIPITOR) 20 MG tablet take 1 TABLET BY MOUTH EVERY DAY 01/22/17  Yes [name], [name], DO  benztropine (COGENTIN) 2 MG tablet Take 2 mg by mouth 2 (two) times daily.   Yes [provider]  cholecalciferol (VITAMIN D) 1000 units tablet Take 1 tablet (1,000 Units total) by mouth daily. 06/07/17  Yes [name], Marcell Anger, DO  Cyanocobalamin (VITAMIN B-12) 6000 MCG SUBL Place 6,000 mcg under the tongue daily.   Yes [provider]  divalproex (DEPAKOTE) 500 MG DR tablet Take 500-2,000 mg by mouth See admin instructions. Take 500mg  every morning and 2000 tabs every evening   Yes [provider]  empagliflozin (JARDIANCE) 25 MG TABS tablet Take 25 mg  by mouth daily. 10/18/17  Yes Hensel, Santiago Bumpers, MD  ferrous sulfate (FERROUSUL) 325 (65 FE) MG tablet Take 1 tablet (325 mg total) by mouth daily with breakfast. 01/10/17  Yes [name], [name], DO  gabapentin (NEURONTIN) 300 MG capsule Take 1 capsule (300 mg total) by mouth 3 (three) times daily as needed. Patient taking differently: Take 300-600 mg by mouth 3 (three) times daily as needed (pain).  10/23/17  Yes [name], [name], DO  glimepiride (AMARYL) 4 MG tablet Take 1 tablet (4 mg total) by mouth daily. 10/18/17  Yes Hensel, Santiago Bumpers, MD  haloperidol (HALDOL) 10 MG tablet Take 5 mg by  mouth every evening.    Yes [provider]  haloperidol (HALDOL) 5 MG tablet Take 5 mg by mouth at bedtime.   Yes [provider]  haloperidol decanoate (HALDOL DECANOATE) 100 MG/ML injection Inject 400 mg into the muscle every 28 (twenty-eight) days.   Yes [provider]  lithium carbonate 300 MG capsule Take 300 mg by mouth 2 (two) times daily with a meal.   Yes [provider]  losartan (COZAAR) 25 MG tablet TAKE 1 TABLET BY MOUTH EVERY DAY Patient taking differently: TAKE 25mg  BY MOUTH EVERY DAY 10/03/16  Yes Garth Bigness, MD  metFORMIN (GLUCOPHAGE-XR) 750 MG 24 hr tablet Take 1 tablet (750 mg total) by mouth 2 (two) times daily with a meal. 10/18/17  Yes Hensel, Santiago Bumpers, MD  montelukast (SINGULAIR) 10 MG tablet take 1 TABLET BY MOUTH EVERY DAY Patient taking differently: take 1 TABLET BY MOUTH EVERY EVENING 01/22/17  Yes [name], [name], DO  Multiple Vitamin (DAILY-VITE) TABS TAKE 1 TABLET BY MOUTH DAILY. 10/03/16  Yes Garth Bigness, MD  omeprazole (PRILOSEC) 20 MG capsule Take 2 capsules (40 mg total) by mouth every evening. 09/17/17  Yes Tillman Sers, DO  ACCU-CHEK AVIVA PLUS test strip use as directed to check blood sugar THREE TIMES DAILY 07/02/17   Tillman Sers, DO  ACCU-CHEK SOFTCLIX LANCETS lancets use as directed *may use THREE TIMES DAILY to check blood sugar* 06/28/17   Tillman Sers, DO  acetaminophen (TYLENOL) 650 MG CR tablet Take 650 mg by mouth 2 (two) times daily as needed for pain.     [provider]  ammonium lactate (AMLACTIN) 12 % cream Apply topically as needed for dry skin. Patient taking differently: Apply 1 g topically as needed for dry skin.  07/26/16   Felecia Shelling, DPM  aspirin EC 81 MG tablet Take 1 tablet (81 mg total) by mouth daily. Patient not taking: Reported on 10/18/2017 06/07/17   Tillman Sers, DO  azelastine (ASTELIN) 0.1 % nasal spray Place 1-2 sprays into both nostrils 2 (two) times daily  as needed for rhinitis. Patient not taking: Reported on 10/18/2017 07/30/17   Bobbitt, Heywood Iles, MD  benzonatate (TESSALON) 100 MG capsule Take 1 capsule (100 mg total) by mouth 2 (two) times daily as needed for cough. Patient not taking: Reported on 10/18/2017 06/22/17   Tillman Sers, DO  cetirizine (ZYRTEC) 10 MG tablet Take 1 tablet (10 mg total) by mouth daily. Patient not taking: Reported on 10/18/2017 01/10/17   Tillman Sers, DO  Colchicine (MITIGARE) 0.6 MG CAPS Take 0.6 mg by mouth daily. Take 2 tablets once, then take 1 tablet one hour later. Patient not taking: Reported on 10/18/2017 10/17/17   Garth Bigness, MD  diclofenac sodium (VOLTAREN) 1 % GEL Apply 4 g topically 4 (four)  times daily. Patient not taking: Reported on 10/26/2017 01/01/17   MayoAllyn Kenner, MD  fluticasone [facility]) 50 MCG/ACT nasal spray Place 2 sprays into both nostrils daily. Patient not taking: Reported on 10/18/2017 12/05/16   [name] C, DO  fluticasone (FLOVENT HFA) 110 MCG/ACT inhaler Inhale 2 puffs into the lungs 2 (two) times daily. 07/30/17   Bobbitt, Heywood Iles, MD  Fluticasone-Salmeterol (ADVAIR DISKUS) 250-50 MCG/DOSE AEPB Inhale 1 puff into the lungs 2 (two) times daily. Patient not taking: Reported on 10/18/2017 07/19/16   Tillman Sers, DO  nicotine (NICODERM CQ - DOSED IN MG/24 HOURS) 21 mg/24hr patch Place 1 patch (21 mg total) onto the skin daily. Patient not taking: Reported on 10/18/2017 02/16/17   Tillman Sers, DO  nicotine polacrilex (EQ NICOTINE) 4 MG lozenge Take 1 lozenge (4 mg total) by mouth as needed for smoking cessation. 10/18/17   Moses Manners, MD  olopatadine (PATANOL) 0.1 % ophthalmic solution Place 1 drop into both eyes 2 (two) times daily as needed for allergies. Patient not taking: Reported on 10/18/2017 07/30/17   Bobbitt, Heywood Iles, MD  phenylephrine-shark liver oil-mineral oil-petrolatum (PREPARATION H) 0.25-14-74.9 % rectal ointment Place 1 application rectally 2  (two) times daily as needed for hemorrhoids. Patient not taking: Reported on 10/18/2017 05/04/17   Tillman Sers, DO  vancomycin (VANCOCIN) 50 mg/mL oral solution Take 2.5 mLs (125 mg total) by mouth every 6 (six) hours. Patient not taking: Reported on 10/18/2017 05/23/17   Renae Fickle, MD    Family History Family History  Problem Relation Age of Onset  . Allergic rhinitis Neg Hx   . Angioedema Neg Hx   . Asthma Neg Hx   . Atopy Neg Hx   . Eczema Neg Hx   . Immunodeficiency Neg Hx   . Urticaria Neg Hx     Social History Social History   Tobacco Use  . Smoking status: Current Every Day Smoker    Packs/day: 0.50    Years: 45.00    Pack years: 22.50    Types: Cigarettes    Start date: 05/16/1971  . Smokeless tobacco: Never Used  Substance Use Topics  . Alcohol use: No  . Drug use: No     Allergies   Chantix [varenicline tartrate]; Ativan [lorazepam]; and Penicillins   Review of Systems Review of Systems  Respiratory: Negative for shortness of breath.   Gastrointestinal: Negative for abdominal pain, nausea and vomiting.  Endocrine: Negative for polydipsia and polyuria.  Musculoskeletal: Negative for back pain.  Neurological: Negative for syncope and headaches.  Psychiatric/Behavioral: Positive for confusion (resolved).  All other systems reviewed and are negative.    Physical Exam Updated Vital Signs BP 112/85   Pulse 62   Temp 98.5 F (36.9 C) (Oral)   Resp 14   SpO2 98%   Physical Exam  Constitutional: He is oriented to person, place, and time. He appears well-developed and well-nourished. No distress.  Resting comfortably in bed  HENT:  Head: Normocephalic and atraumatic.  Eyes: Pupils are equal, round, and reactive to light. Conjunctivae and EOM are normal. Right eye exhibits no discharge. Left eye exhibits no discharge.  Neck: Normal range of motion. Neck supple. No JVD present. No tracheal deviation present.  Cardiovascular: Normal rate, regular  rhythm and normal heart sounds.  Pulmonary/Chest: Effort normal and breath sounds normal.  Abdominal: Soft. Bowel sounds are normal. He exhibits no distension. There is no tenderness.  No CVA tenderness  Musculoskeletal: He  exhibits no edema.  5/5 strength of BUE and BLE major muscle groups including good grip strength bilaterally  Neurological: He is alert and oriented to person, place, and time. No cranial nerve deficit or sensory deficit. Coordination normal.  Mildly dysarthric speech.  Patient states this is chronic and unchanged.  No facial droop.  Sensation intact to soft touch of extremities.  Cranial nerves II through XII tested and intact.  Normal finger-to-nose of the bilateral upper extremities. Ambulates with normal gait and balance, able to Heel Walk and Toe Walk without difficulty.  5/5 strength of BUE and BLE major muscle groups.  Skin: Skin is warm and dry. No erythema.  Psychiatric: He has a normal mood and affect. His behavior is normal.  Nursing note and vitals reviewed.    ED Treatments / Results  Labs (all labs ordered are listed, but only abnormal results are displayed) Labs Reviewed  BASIC METABOLIC PANEL - Abnormal; Notable for the following components:      Result Value   Potassium 5.2 (*)    Glucose, Bld 240 (*)    Creatinine, Ser 1.38 (*)    GFR calc non Af Amer 54 (*)    All other components within normal limits  CBC WITH DIFFERENTIAL/PLATELET - Abnormal; Notable for the following components:   Hemoglobin 12.7 (*)    All other components within normal limits  CBG MONITORING, ED - Abnormal; Notable for the following components:   Glucose-Capillary 228 (*)    All other components within normal limits    EKG None  Radiology No results found.  Procedures Procedures (including critical care time)  Medications Ordered in ED Medications - No data to display   Initial Impression / Assessment and Plan / ED Course  I have reviewed the triage vital  signs and the nursing notes.  Pertinent labs & imaging results that were available during my care of the patient were reviewed by me and considered in my medical decision making (see chart for details).     Patient presents for evaluation after taking 2 tablets of his Jardiance instead of 1 at 9:30 AM this morning accidentally.  He is afebrile, vital signs are stable.  He is nontoxic in appearance.  No focal neurologic deficits.  His lab work reviewed by me is at his baseline with elevated creatinine of 1.38.  He is not hypoglycemic.  He is amatory without difficulty.  Stable for discharge home with follow-up with his PCP for reevaluation.  Encourage the patient to continue to check his blood sugar before every meal.  Discussed strict ED return precautions. Pt verbalized understanding of and agreement with plan and is safe for discharge home at this time.  No complaints prior to discharge.  Final Clinical Impressions(s) / ED Diagnoses   Final diagnoses:  Medication administered in error, accidental or unintentional, initial encounter    ED Discharge Orders    None       Bennye AlmFawze, Karlin Heilman A, PA-C 10/26/17 1616    Cathren LaineSteinl, Kevin, MD 10/27/17 [id number]

## 2017-10-26 NOTE — ED Notes (Signed)
Urine sample at bedside if needed  

## 2017-10-26 NOTE — ED Notes (Signed)
Pt given crackers, drink, cheese sticks.

## 2017-10-29 ENCOUNTER — Telehealth: Payer: Self-pay

## 2017-10-29 NOTE — Telephone Encounter (Signed)
Patient left long message basically asking to speak with Dr Raymondo BandKoval to discuss the nicotine lozenge vs the patch. He was told the lozenge wasn't good for him because of his DM?  Has appt with PCP later this week but would like to speak about this sooner.  Call back is [phone]  Ples SpecterAlisa Brake, RN [facility] RN)

## 2017-10-29 NOTE — Telephone Encounter (Signed)
Patient reported not smoking today - did NOT use  gum yet.  Encouraged to use a few lozenges  per day and avoid use of cigs.   He was encouraged to use as needed for cravings.   F/U in our office this Thursday.   Plan to follow-up with him on Thursday.

## 2017-11-01 ENCOUNTER — Ambulatory Visit: Payer: Medicaid Other | Admitting: Family Medicine

## 2017-11-01 ENCOUNTER — Encounter: Payer: Self-pay | Admitting: Family Medicine

## 2017-11-01 VITALS — BP 120/82 | HR 80 | Temp 98.7°F | Wt 228.6 lb

## 2017-11-01 DIAGNOSIS — N183 Chronic kidney disease, stage 3 unspecified: Secondary | ICD-10-CM

## 2017-11-01 DIAGNOSIS — E1140 Type 2 diabetes mellitus with diabetic neuropathy, unspecified: Secondary | ICD-10-CM

## 2017-11-01 LAB — POCT CBG (FASTING - GLUCOSE)-MANUAL ENTRY: Glucose Fasting, POC: 162 mg/dL — AB (ref 70–99)

## 2017-11-01 NOTE — Progress Notes (Signed)
    Subjective:    Patient ID: Dylan Jordan, male    DOB: [birth year], 62 y.o.   MRN: [id number]   CC: ER follow up   HPI: he had accidentally taken double his dose of Jardiance however his CBGs were normal in ED and patient was asymptomatic. He was noted on labs to have elevated creatinine and follow up with PCP was recommended.  Today patient reports he did not sleep well and now he is slurring he speech. He reports feeling confused and having the "haldol shuffle". He had his haldol injection earlier this week.   Smoking status reviewed- current smoker. Working with Dr. Raymondo BandKoval.  Review of Systems- denies fevers, chills, focal weakness   Objective:  BP 120/82   Pulse 80   Temp 98.7 F (37.1 C)   Wt 228 lb 9.6 oz (103.7 kg)   SpO2 98%   BMI 29.35 kg/m  Vitals and nursing note reviewed  General: well nourished, in no acute distress HEENT: normocephalic, MMM, PERRL Cardiac: RRR, clear S1 and S2, no murmurs, rubs, or gallops Respiratory: clear to auscultation bilaterally, no increased work of breathing Extremities: no edema or cyanosis. Skin: warm and dry, no rashes noted Neuro: alert and oriented, no focal deficits. Strength 5/5 in upper and lower extremities bilaterally   Assessment & Plan:   1. Stage 3 chronic kidney disease (HCC) Will repeat BMP today. Patient on several medications that could be elevating Cr. He also has hypertension and DM. He largely drinks sodas instead of water. He has gout and has been known to take NSAIDs. Will follow up with patient about results via phone and go from there. - Basic Metabolic Panel  2. Type 2 diabetes mellitus with diabetic neuropathy, without long-term current use of insulin (HCC) Ordered fasting CBG due to reported confusion by patient and hx of overmedicating. CBG 162 in office. Follow up 1 month for A1C.  - POCT CBG (Fasting - Glucose)   Return in about 1 month (around 11/29/2017) for DM.   Dolores PattyAngela Henri Guedes, DO Family  Medicine Resident PGY-2

## 2017-11-01 NOTE — Patient Instructions (Addendum)
It was great seeing you today!  We will call you with your lab results.  Please follow up in 1 month for diabetes A1C check.  If you have questions or concerns please do not hesitate to call at [phone].  Dylan Patty, DO PGY-2, [facility] 11/01/2017 2:33 PM  Low-Purine Diet Purines are compounds that affect the level of uric acid in your body. A low-purine diet is a diet that is low in purines. Eating a low-purine diet can prevent the level of uric acid in your body from getting too high and causing gout or kidney stones or both. What do I need to know about this diet?  Choose low-purine foods. Examples of low-purine foods are listed in the next section.  Drink plenty of fluids, especially water. Fluids can help remove uric acid from your body. Try to drink 8-16 cups (1.9-3.8 L) a day.  Limit foods high in fat, especially saturated fat, as fat makes it harder for the body to get rid of uric acid. Foods high in saturated fat include pizza, cheese, ice cream, whole milk, fried foods, and gravies. Choose foods that are lower in fat and lean sources of protein. Use olive oil when cooking as it contains healthy fats that are not high in saturated fat.  Limit alcohol. Alcohol interferes with the elimination of uric acid from your body. If you are having a gout attack, avoid all alcohol.  Keep in mind that different people's bodies react differently to different foods. You will probably learn over time which foods do or do not affect you. If you discover that a food tends to cause your gout to flare up, avoid eating that food. You can more freely enjoy foods that do not cause problems. If you have any questions about a food item, talk to your dietitian or health care provider. Which foods are low, moderate, and high in purines? The following is a list of foods that are low, moderate, and high in purines. You can eat any amount of the foods that are low in purines. You may  be able to have small amounts of foods that are moderate in purines. Ask your health care provider how much of a food moderate in purines you can have. Avoid foods high in purines. Grains  Foods low in purines: Enriched white bread, pasta, rice, cake, cornbread, popcorn.  Foods moderate in purines: Whole-grain breads and cereals, wheat germ, bran, oatmeal. Uncooked oatmeal. Dry wheat bran or wheat germ.  Foods high in purines: Pancakes, [place] toast, biscuits, muffins. Vegetables  Foods low in purines: All vegetables, except those that are moderate in purines.  Foods moderate in purines: Asparagus, cauliflower, spinach, mushrooms, green peas. Fruits  All fruits are low in purines. Meats and other Protein Foods  Foods low in purines: Eggs, nuts, peanut butter.  Foods moderate in purines: 80-90% lean beef, lamb, veal, pork, poultry, fish, eggs, peanut butter, nuts. Crab, lobster, oysters, and shrimp. Cooked dried beans, peas, and lentils.  Foods high in purines: Anchovies, sardines, herring, mussels, tuna, codfish, scallops, trout, and haddock. Tomasa Blase. Organ meats (such as liver or kidney). Tripe. Game meat. Goose. Sweetbreads. Dairy  All dairy foods are low in purines. Low-fat and fat-free dairy products are best because they are low in saturated fat. Beverages  Drinks low in purines: Water, carbonated beverages, tea, coffee, cocoa.  Drinks moderate in purines: Soft drinks and other drinks sweetened with high-fructose corn syrup. Juices. To find whether a food or drink  is sweetened with high-fructose corn syrup, look at the ingredients list.  Drinks high in purines: Alcoholic beverages (such as beer). Condiments  Foods low in purines: Salt, herbs, olives, pickles, relishes, vinegar.  Foods moderate in purines: Butter, margarine, oils, mayonnaise. Fats and Oils  Foods low in purines: All types, except gravies and sauces made with meat.  Foods high in purines: Gravies and sauces  made with meat. Other Foods  Foods low in purines: Sugars, sweets, gelatin. Cake. Soups made without meat.  Foods moderate in purines: Meat-based or fish-based soups, broths, or bouillons. Foods and drinks sweetened with high-fructose corn syrup.  Foods high in purines: High-fat desserts (such as ice cream, cookies, cakes, pies, doughnuts, and chocolate). Contact your dietitian for more information on foods that are not listed here. This information is not intended to replace advice given to you by your health care provider. Make sure you discuss any questions you have with your health care provider. Document Released: 08/26/2010 Document Revised: 10/07/2015 Document Reviewed: 04/07/2013 Elsevier Interactive Patient Education  2017 [employer]

## 2017-11-02 LAB — BASIC METABOLIC PANEL
BUN / CREAT RATIO: 12 (ref 10–24)
BUN: 17 mg/dL (ref 8–27)
CHLORIDE: 105 mmol/L (ref 96–106)
CO2: 20 mmol/L (ref 20–29)
Calcium: 9.4 mg/dL (ref 8.6–10.2)
Creatinine, Ser: 1.47 mg/dL — ABNORMAL HIGH (ref 0.76–1.27)
GFR calc Af Amer: 59 mL/min/{1.73_m2} — ABNORMAL LOW (ref >59)
GFR calc non Af Amer: 51 mL/min/{1.73_m2} — ABNORMAL LOW (ref >59)
GLUCOSE: 167 mg/dL — AB (ref 65–99)
Potassium: 4.9 mmol/L (ref 3.5–5.2)
SODIUM: 140 mmol/L (ref 134–144)

## 2017-11-02 NOTE — Progress Notes (Signed)
Called [name] to relay BMP results. Kidney function remains elevated. He is taking ibuprofen 800 mg as needed for pain. I asked him to discontinue this and other NSAIDs. He is to use tylenol as needed for pain instead. He voices understanding. He is also on losartan, metformin, allopurinol. Will continue these for now. He was interested in increasing allopurinol, I said no. We will repeat labs in 3 weeks at his next appt.   Dolores PattyAngela Chika Cichowski, DO PGY-2, [facility] 11/02/2017 1:31 PM

## 2017-11-09 ENCOUNTER — Other Ambulatory Visit: Payer: Self-pay | Admitting: Family Medicine

## 2017-11-12 ENCOUNTER — Other Ambulatory Visit: Payer: Self-pay

## 2017-11-12 MED ORDER — OMEPRAZOLE 20 MG PO CPDR: 40.0000 mg | DELAYED_RELEASE_CAPSULE | Freq: Every evening | ORAL | 2 refills | Status: DC

## 2017-11-22 ENCOUNTER — Other Ambulatory Visit: Payer: Self-pay

## 2017-11-22 ENCOUNTER — Encounter: Payer: Self-pay | Admitting: Family Medicine

## 2017-11-22 ENCOUNTER — Ambulatory Visit (INDEPENDENT_AMBULATORY_CARE_PROVIDER_SITE_OTHER): Payer: Medicaid Other | Admitting: Pharmacist

## 2017-11-22 ENCOUNTER — Ambulatory Visit: Payer: Medicaid Other | Admitting: Family Medicine

## 2017-11-22 ENCOUNTER — Encounter: Payer: Self-pay | Admitting: Pharmacist

## 2017-11-22 VITALS — BP 128/80 | HR 94 | Temp 98.6°F | Ht 74.0 in | Wt 234.0 lb

## 2017-11-22 VITALS — BP 128/80 | HR 94 | Ht 74.0 in | Wt 234.0 lb

## 2017-11-22 DIAGNOSIS — N183 Chronic kidney disease, stage 3 unspecified: Secondary | ICD-10-CM

## 2017-11-22 DIAGNOSIS — Z720 Tobacco use: Secondary | ICD-10-CM | POA: Diagnosis not present

## 2017-11-22 DIAGNOSIS — E1140 Type 2 diabetes mellitus with diabetic neuropathy, unspecified: Secondary | ICD-10-CM

## 2017-11-22 DIAGNOSIS — R079 Chest pain, unspecified: Secondary | ICD-10-CM | POA: Diagnosis not present

## 2017-11-22 DIAGNOSIS — M545 Low back pain: Secondary | ICD-10-CM | POA: Diagnosis not present

## 2017-11-22 DIAGNOSIS — G8929 Other chronic pain: Secondary | ICD-10-CM

## 2017-11-22 DIAGNOSIS — N3946 Mixed incontinence: Secondary | ICD-10-CM

## 2017-11-22 DIAGNOSIS — M1A9XX0 Chronic gout, unspecified, without tophus (tophi): Secondary | ICD-10-CM | POA: Diagnosis not present

## 2017-11-22 LAB — POCT URINALYSIS DIP (MANUAL ENTRY)
BILIRUBIN UA: NEGATIVE
BILIRUBIN UA: NEGATIVE mg/dL
Blood, UA: NEGATIVE
LEUKOCYTES UA: NEGATIVE
NITRITE UA: NEGATIVE
PH UA: 6 (ref 5.0–8.0)
Protein Ur, POC: NEGATIVE mg/dL
Spec Grav, UA: <=1.005 — AB (ref 1.010–1.025)
Urobilinogen, UA: 0.2 E.U./dL

## 2017-11-22 LAB — POCT GLYCOSYLATED HEMOGLOBIN (HGB A1C): HBA1C, POC (CONTROLLED DIABETIC RANGE): 8.2 % — AB (ref 0.0–7.0)

## 2017-11-22 NOTE — Patient Instructions (Addendum)
We'll check kidney function today.  I have referred you to cardiology, you will get a call to make an appointment.  Please do the exercises below. We'll check back in at your next appointment to see how you're doing.  I'll let you know about your lab results.  If you have questions or concerns please do not hesitate to call at [phone].  Dolores PattyAngela Chavon Lucarelli, DO PGY-2, [facility] 11/22/2017 3:47 PM   Bladder training  1. Start by going to the toilet and trying to urinate as often as your shortest voiding interval (the length of time between trips to the bathroom) based on your voiding diary. For example, go everyhour if that is what your bladder diary indicates is the shortest interval of time between visits to urinate. Make these regular trips to the toilet while you are awake. You do not have to get up during the night!  2. You must try to urinate whether you feel the need or not. You must try to urinate even if you have just been incontinent.  3. If you get a strong urge to go to the bathroom before your scheduled time, use distraction or relaxation: 0 Stop, do not run to the bathroom! 0 Stand still or sit down if you can. 0 RELAX. Take a deep breath and let it out slowly. 0 Concentrate on making the urge decrease or even go away anyway you can (imagine the pressure becoming less and less). You can also try doing quick contractions of your pelvic floor muscles. 0 DISTRACT yourself, for example by doing math problems in your head. 0 When you feel IN CONTROL OF YOUR BLADDER, walk slowly to the bathroom, and then go.  4. Keep this schedule until you can goone day without urine leakage. Then, increase the time between scheduled trips to the toilet by 15 minutes. When you can go oneday on this new schedule without urine leakage, extend the time between bathroom trips again by 15 minutes.  5. Keep this up until you can go four hours between trips to the toilet (which is  NORMAL), or until you are comfortable with a shorter time interval. This may take several weeks.  6. DO NOT GET DISCOURAGED! Bladder training takes time and effort, but it is an effective way to get rid of incontinence without medication or surgery.    Kegel Exercises Kegel exercises help strengthen the muscles that support the rectum, vagina, small intestine, bladder, and uterus. Doing Kegel exercises can help:  Improve bladder and bowel control.  Improve sexual response.  Reduce problems and discomfort during pregnancy.  Kegel exercises involve squeezing your pelvic floor muscles, which are the same muscles you squeeze when you try to stop the flow of urine. The exercises can be done while sitting, standing, or lying down, but it is best to vary your position. Phase 1 exercises 1. Squeeze your pelvic floor muscles tight. You should feel a tight lift in your rectal area. If you are a male, you should also feel a tightness in your vaginal area. Keep your stomach, buttocks, and legs relaxed. 2. Hold the muscles tight for up to 10 seconds. 3. Relax your muscles. Repeat this exercise 50 times a day or as many times as told by your health care provider. Continue to do this exercise for at least 4-6 weeks or for as long as told by your health care provider.   Low back Rehab Ask your health care provider which exercises are safe for  you. Do exercises exactly as told by your health care provider and adjust them as directed. It is normal to feel mild stretching, pulling, tightness, or discomfort as you do these exercises, but you should stop right away if you feel sudden pain or your pain gets worse. Do not begin these exercises until told by your health care provider. Stretching and range of motion exercises These exercises warm up your muscles and joints and improve the movement and flexibility of your hips and your back. These exercises may also help to relieve pain, numbness, and  tingling. Exercise A: Single knee to chest  1. Lie on your back on a firm surface with both legs straight. 2. Bend one of your knees. Use your hands to move your knee up toward your chest until you feel a gentle stretch in your lower back and buttock. ? Hold your leg in this position by holding onto the front of your knee. ? Keep your other leg as straight as possible. 3. Hold for __________ seconds. 4. Slowly return to the starting position. 5. Repeat this exercise with your other leg. Repeat __________ times. Complete this exercise __________ times a day. Exercise B: Double knee to chest  4. Lie on your back on a firm surface with both legs straight. 5. Bend one of your knees and move it toward your chest until you feel a gentle stretch in your lower back and buttock. 6. Tense your abdominal muscles and repeat the previous step with your other leg. 7. Hold both of your legs in this position by holding onto the backs of your thighs or the fronts of your knees. 8. Hold for __________ seconds. 9. Tense your abdominal muscles and slowly move your legs back to the floor, one leg at a time. Repeat __________ times. Complete this exercise __________ times a day.  Strengthening exercises These exercises build strength and endurance in your back. Endurance is the ability to use your muscles for a long time, even after they get tired. Exercise C: Pelvic tilt 1. Lie on your back on a firm bed or the floor. Bend your knees and keep your feet flat. 2. Tense your abdominal muscles. Tip your pelvis up toward the ceiling and flatten your lower back into the floor. ? To help with this exercise, you may place a small towel under your lower back and try to push your back into the towel. 3. Hold for __________ seconds. 4. Let your muscles relax completely before you repeat this exercise. Repeat __________ times. Complete this exercise __________ times a day. Exercise D: Abdominal crunch  1. Lie on your  back on a firm surface. Bend your knees and keep your feet flat. Cross your arms over your chest. 2. Tuck your chin down toward your chest, without bending your neck. 3. Use your abdominal muscles to lift your upper body off of the ground, straight up into the air. ? Try to lift yourself until your shoulder blades are off the ground. You may need to work up to this. ? Keep your lower back on the ground while you crunch upward. ? Do not hold your breath. 4. Slowly lower yourself down. Keep your abdominal muscles tense until you are back to the starting position. Repeat __________ times. Complete this exercise __________ times a day. Exercise E: Alternating arm and leg raises  1. Get on your hands and knees on a firm surface. If you are on a hard floor, you may want to use padding to  cushion your knees, such as an exercise mat. 2. Line up your arms and legs. Your hands should be below your shoulders, and your knees should be below your hips. 3. Lift your left leg behind you. At the same time, raise your right arm and straighten it in front of you. ? Do not lift your leg higher than your hip. ? Do not lift your arm higher than your shoulder. ? Keep your abdominal and back muscles tight. ? Keep your hips facing the ground. ? Do not arch your back. ? Keep your balance carefully, and do not hold your breath. 4. Hold for __________ seconds. 5. Slowly return to the starting position and repeat with your right leg and your left arm. Repeat __________ times. Complete this exercise __________ times a day. Posture and body mechanics  Body mechanics refers to the movements and positions of your body while you do your daily activities. Posture is part of body mechanics. Good posture and healthy body mechanics can help to relieve stress in your body's tissues and joints. Good posture means that your spine is in its natural S-curve position (your spine is neutral), your shoulders are pulled back slightly,  and your head is not tipped forward. The following are general guidelines for applying improved posture and body mechanics to your everyday activities. Standing   When standing, keep your spine neutral and your feet about hip-width apart. Keep a slight bend in your knees. Your ears, shoulders, and hips should line up.  When you do a task in which you stand in one place for a long time, place one foot up on a stable object that is 2-4 inches (5-10 cm) high, such as a footstool. This helps keep your spine neutral. Sitting   When sitting, keep your spine neutral and keep your feet flat on the floor. Use a footrest, if necessary, and keep your thighs parallel to the floor. Avoid rounding your shoulders, and avoid tilting your head forward.  When working at a desk or a computer, keep your desk at a height where your hands are slightly lower than your elbows. Slide your chair under your desk so you are close enough to maintain good posture.  When working at a computer, place your monitor at a height where you are looking straight ahead and you do not have to tilt your head forward or downward to look at the screen. Resting  When lying down and resting, avoid positions that are most painful for you.  If you have pain with activities such as sitting, bending, stooping, or squatting (flexion-based activities), lie in a position in which your body does not bend very much. For example, avoid curling up on your side with your arms and knees near your chest (fetal position).  If you have pain with activities such as standing for a long time or reaching with your arms (extension-based activities), lie with your spine in a neutral position and bend your knees slightly. Try the following positions: ? Lying on your side with a pillow between your knees. ? Lying on your back with a pillow under your knees.  Lifting   When lifting objects, keep your feet at least shoulder-width apart and tighten your  abdominal muscles.  Bend your knees and hips and keep your spine neutral. It is important to lift using the strength of your legs, not your back. Do not lock your knees straight out.  Always ask for help to lift heavy or awkward objects. This information  is not intended to replace advice given to you by your health care provider. Make sure you discuss any questions you have with your health care provider. Document Released: 05/01/2005 Document Revised: 01/06/2016 Document Reviewed: 02/09/2015 Elsevier Interactive Patient Education  [employer].

## 2017-11-22 NOTE — Assessment & Plan Note (Signed)
   Hypertension longstanding currently controlled on current BP medications.  BP goal = <130/80 mmHg. Patient is adherent with medications. -Continue losartan 25mg  and amlodipine 5mg

## 2017-11-22 NOTE — Patient Instructions (Addendum)
A1C was up to 8.2 today. I think your A1C is a result of high blood sugars in the recent past. I like the blood sugars we are seeing on your meter in the 100s.   Decrease your glimepiride to one pill once a day. Decrease your metformin down to one pill once a day. New goal is to drink less than 2 sodas a day and be quit from cigarettes by next appointment with Dr. Raymondo BandKoval.   Come back to see us in 2-4 weeks.

## 2017-11-22 NOTE — Assessment & Plan Note (Signed)
Diabetes longstanding currently with improved control. Tolerating increased Jardiance well continues to have diarrhea on metformin XR 750 mg BID and did not dose reduce the glimepiride. Patient is adherent with medication otherwise. Patient is able to verbalize appropriate hypoglycemia management plan. Control is suboptimal due to dietary indiscretion, specifically with soda intake. -A1C today. Resulted at 8.2%. This is incongruent with CBGs from recent meter readings since increased Jardiance. Suspect A1C uncontrolled due to influence from recently uncontrolled CBGs in May/early June.  -Decreased glimepiride to 4 mg daily and decreased metformin to 750 mg daily. Wrote these instructions on pill bottles and corrected medication organizer.  -Extensively discussed pathophysiology of DM, recommended lifestyle interventions, dietary effects on glycemic control -Counseled on s/sx of and management of hypoglycemia -Next A1C anticipated 3 months

## 2017-11-22 NOTE — Assessment & Plan Note (Signed)
  Tobacco use disorder with moderate nicotine dependence of >20 years duration in a patient who is good candidate for success because of willingness to quit and demonstrated progress.  - Continue nicotine lozenges 4 mg PRN.  - Set quit date for 4 weeks from now

## 2017-11-22 NOTE — Progress Notes (Signed)
Subjective:    Patient ID: Dylan Jordan, male    DOB: Feb 16, 1956, 62 y.o.   MRN: [id number]  CC: follow up DM  DM- met with pharmacy clinic today to discuss medication regimen. A1C from this visit slightly worsened. Several medication changes made. Patient tells me he has many dietary changes he needs to make and he is disappointed in worsened A1C. He states he knows he needs to cut out sodas but has continued to drink 2-4 20 oz sodas a day. Denies increased thirst or hunger. Reports increased urination.   Chest pain- intermittent, mostly occurs at rest. He describes it as the middle of his chest and a "tumbling" feeling but denies feeling like his heart is racing or skipping beats. He denies chest pain or shortness of breath. Given his many medical conditions he is interested in seeing cardiology.   Gout- first toe on left foot chronically painful with weight bearing. He is on 200 mg allopurinol daily. He is requesting to take colchicine again for gout but due to worsening kidney function this was held. He is getting repeat labs today.   Low back pain- chronic issue. He feels this is getting worse with time. He is interested in rehab exercises. His sister had surgery which was successful for her so he is interested in surgery if it will help.   Urinary incontinence- Reports this is giong on since March. He has not mentioned it to me until now. He reports "this is a serious problem and i'm only telling you beginning of it". He has had several episodes of nocturnal enuresis ruining several mattresses since then. He reports having to urinate every 20-30 minutes. He often cannot make it to the bathroom on time. He will pee in his sleep. He is very distressed discussing this. He denies any pain with urination, no blood in urine.   Smoking status reviewed- current smoker  Review of Systems- see HPI   Objective:  BP 128/80 Comment: from pharmacy clinic  Pulse 94 Comment: from pharmacy clinic   Temp 98.6 F (37 C) (Oral)   Ht '6\' 2"'  (1.88 m) Comment: from pharmacy clinic  Wt 234 lb (106.1 kg) Comment: from pharmacy visit  SpO2 98% Comment: from pharmacy clinic  BMI 30.04 kg/m  Vitals and nursing note reviewed  General: well nourished, in no acute distress HEENT: normocephalic, MMM Cardiac: RRR, clear S1 and S2, no murmurs, rubs, or gallops Respiratory: clear to auscultation bilaterally, no increased work of breathing Abdomen: soft, nontender, nondistended, no masses or organomegaly. Bowel sounds present Extremities: no edema or cyanosis. No erythema or swelling over right foot but tender to palpation of 1st MTP Skin: warm and dry, no rashes noted Neuro: alert and oriented, no focal deficits  Assessment & Plan:    1. Type 2 diabetes mellitus with diabetic neuropathy, without long-term current use of insulin [facility]) Patient met with pharmacy today and had several medication changes made. New regimen: glimepiride 4 mg daily, metformin 750 mg daily, jardiance 25 mg daily. Suspect glucosuria contributing to incontinence, see below. -repeat A1C in 3 months - Basic Metabolic Panel - POCT urinalysis dipstick  2. Stage 3 chronic kidney disease (HCC) Repeat BMP today. Will continue to monitor this. If worsened will have him establish with nephrology. He is certainly heading for kidney problems given his poor diabetes control due to diet indiscretions.  3. Intermittent chest pain Patient reports fluttering uncomfortable feeling in chest mostly at rest or when trying to fall asleep  at night. He is asymptomatic in the office. Sounds like PVCs and reassured patient however he does have many significant risk factors for CAD and will let him get established with cardiology for risk start/stress testing, etc. - Ambulatory referral to Cardiology  4. Mixed stress and urge urinary incontinence Discussed possible reasons with patient and told him we will need to continue investigating this.  Suspect it's related to poorly controlled sugars. Will get UA today. Gave instructions on bladder training. Discussed doing Kegel exercises. He may need urology referral. Will follow up in 1 month.  - POCT urinalysis dipstick  5. Gout Chronic, poorly controlled. Continue 200 mg allopurinol daily. Repeat BMP today. If kidney function ok will let patient take colchicine again.   6. Chronic low back pain- chronic, stable -handout of back exercises given to patient -follow up next appointment  Return in about 1 month (around 12/20/2017).   Lucila Maine, DO Family Medicine Resident PGY-2

## 2017-11-22 NOTE — Progress Notes (Signed)
S:     Chief Complaint  Patient presents with  . Medication Management    DM, HTN, smoking    Patient arrives in good spirits, ambulating without assistance.  Presents for diabetes evaluation, education, and management at the request of Dr. Wonda Olds. Patient was referred on 08/29/17.  Patient was last seen by Primary Care Provider on 11/01/17. Patient brings in all medications and pill organizer for review. Also brings in CBG meter. Per patient, misunderstood directions from last Rx clinic visit and took metformin XR 750 mg BID and glimepiride 4 mg BID. Continues to complain of daily diarrhea but has improved to one episode/day. Not taking metformin XR with food.   Reports recent h/o chest pain, brought on at random and resolved with time. Also complains of foot pain along outside of left great toe. No redness and not hot to touch. Able to walk but with some difficulty. Does not feel like gout per patient.   Patient reports he has greatly decreased cigarette use, down to 1-3 cigarettes every few days. Has gone a whole week at a time without smoking. Triggers include being around those that smoke and offer him cigarettes. Using nicotine lozenges which he finds to be helpful. Reports he is not taking Advair but is taking flovent PRN. Used 2x since last visit. Reports breathing has been good but does complain of cough on occasion, improved from previous.  Insurance coverage/medication affordability: [place] [employer]  Patient reports adherence with medications other than mix up with metformin XR and glimepiride Current diabetes medications include: metformin XR 750 mg BID, glimepiride 4 mg BID, Jardiance 25 mg daily.  Current hypertension medications include: amlodipine 5 mg daily, losartan 25 mg daily  Patient denies hypoglycemic events.  Patient reported dietary habits: Eats 3 meals/day. Continues to drink 2-4 20oz sodas daily.   O:  Physical Exam  Constitutional: He appears well-developed and  well-nourished.     Review of Systems  All other systems reviewed and are negative.    Lab Results  Component Value Date   HGBA1C 8.2 (A) 11/22/2017   Vitals:   11/22/17 1434  BP: 128/80  Pulse: 94  SpO2: 98%    Lipid Panel  No results found for: CHOL, TRIG, HDL, CHOLHDL, VLDL, LDLCALC, LDLDIRECT  Home fasting CBG: 110s-160s 2 hour post-prandial/random CBG: 130s-190s, excursions to low 200s  Clinical ASCVD: No  A/P: Discussed case with Dr. Leveda Anna. With his permission, made the following changes:  Diabetes longstanding currently with improved control. Tolerating increased Jardiance well continues to have diarrhea on metformin XR 750 mg BID and did not dose reduce the glimepiride. Patient is adherent with medication otherwise. Patient is able to verbalize appropriate hypoglycemia management plan. Control is suboptimal due to dietary indiscretion, specifically with soda intake. -A1C today. Resulted at 8.2%. This is incongruent with CBGs from recent meter readings since increased Jardiance. Suspect A1C uncontrolled due to influence from recently uncontrolled CBGs in May/early June.  -Decreased glimepiride to 4 mg daily and decreased metformin to 750 mg daily. Wrote these instructions on pill bottles and corrected medication organizer.  -Extensively discussed pathophysiology of DM, recommended lifestyle interventions, dietary effects on glycemic control -Counseled on s/sx of and management of hypoglycemia -Next A1C anticipated 3 months  Hypertension longstanding currently controlled on current BP medications.  BP goal = <130/80 mmHg. Patient is adherent with medications. -Continue losartan 25mg  and amlodipine 5mg   Tobacco use disorder with moderate nicotine dependence of >20 years duration in a patient who  is good candidate for success because of willingness to quit and demonstrated progress.  - Continue nicotine lozenges 4 mg PRN.  - Set quit date for 4 weeks from  now  Complaint of chest pain - patient to follow up with Dr. Wonda Oldsiccio today. Communicated these findings with her. Please see her note from today.   Complaint of toe pain - visually inspected, does not appear like gout. No wounds or dark places to suggest diabetic foot infection. Patient to follow up with Dr. Wonda Oldsiccio today. Communicated these findings with her. Please see her note from today.    Written patient instructions provided.  Total time in face to face counseling 50 minutes.   Follow up Pharmacist Clinic Visit in 2-4.     Allena Katzaroline E Welles, Pharm.D., BCPS PGY2 Ambulatory Care Pharmacy Resident Phone: [phone]

## 2017-11-22 NOTE — Progress Notes (Signed)
Patient ID: Dylan Jordan, male   DOB: 1955/05/29, 62 y.o.   MRN: [id number] Reviewed: Agree with Dr. Mariam DollarWelles' documentation and management.

## 2017-11-23 ENCOUNTER — Telehealth: Payer: Self-pay

## 2017-11-23 LAB — BASIC METABOLIC PANEL
BUN / CREAT RATIO: 20 (ref 10–24)
BUN: 24 mg/dL (ref 8–27)
CO2: 18 mmol/L — ABNORMAL LOW (ref 20–29)
Calcium: 9.9 mg/dL (ref 8.6–10.2)
Chloride: 101 mmol/L (ref 96–106)
Creatinine, Ser: 1.23 mg/dL (ref 0.76–1.27)
GFR calc Af Amer: 73 mL/min/{1.73_m2} (ref >59)
GFR, EST NON AFRICAN AMERICAN: 63 mL/min/{1.73_m2} (ref >59)
Glucose: 282 mg/dL — ABNORMAL HIGH (ref 65–99)
POTASSIUM: 5.5 mmol/L — AB (ref 3.5–5.2)
SODIUM: 136 mmol/L (ref 134–144)

## 2017-11-23 NOTE — Progress Notes (Signed)
Please let [name] know his kidney function has returned to normal which I am happy about. He can take the colchicine for his gout pain. His urine shows a lot of sugar in it which is probably causing him to pee a lot more. He needs to get sugar under control. No more sodas. I'll see him in 1 month. Thanks.

## 2017-11-23 NOTE — Telephone Encounter (Signed)
[name], [name], DO  P [facility]        Please let [name] know his kidney function has returned to normal which I am happy about. He can take the colchicine for his gout pain. His urine shows a lot of sugar in it which is probably causing him to pee a lot more. He needs to get sugar under control. No more sodas. I'll see him in 1 month. Thanks.    Pt contacted and informed of normal kidney function, which he was happy about. I informed him of high sugar in urine, and pt admitted to drinking more than two sodas a day. I counseled him on this and he said he will "try and cut back." Pt stated his gout flare up is really bad, advised to take colchicine.

## 2017-11-25 DIAGNOSIS — N3946 Mixed incontinence: Secondary | ICD-10-CM | POA: Insufficient documentation

## 2017-11-25 DIAGNOSIS — R079 Chest pain, unspecified: Secondary | ICD-10-CM | POA: Insufficient documentation

## 2017-11-28 ENCOUNTER — Encounter: Payer: Self-pay | Admitting: Podiatry

## 2017-11-28 ENCOUNTER — Ambulatory Visit: Payer: Medicaid Other | Admitting: Podiatry

## 2017-11-28 DIAGNOSIS — M79676 Pain in unspecified toe(s): Secondary | ICD-10-CM | POA: Diagnosis not present

## 2017-11-28 DIAGNOSIS — B351 Tinea unguium: Secondary | ICD-10-CM | POA: Diagnosis not present

## 2017-11-28 DIAGNOSIS — E1142 Type 2 diabetes mellitus with diabetic polyneuropathy: Secondary | ICD-10-CM

## 2017-11-28 DIAGNOSIS — M79609 Pain in unspecified limb: Principal | ICD-10-CM

## 2017-11-28 NOTE — Progress Notes (Signed)
Complaint:  Visit Type: Patient returns to my office for continued preventative foot care services. Complaint: Patient states" my nails have grown long and thick and become painful to walk and wear shoes" The patient presents for preventative foot care services. No changes to ROS.    Podiatric Exam: Vascular: dorsalis pedis and posterior tibial pulses are palpable bilateral. Capillary return is immediate. Temperature gradient is WNL. Skin turgor WNL  Sensorium: Normal Semmes Weinstein monofilament test. Normal tactile sensation bilaterally. Nail Exam: Pt has thick disfigured discolored nails with subungual debris noted bilateral entire nail hallux through fifth toenails Ulcer Exam: There is no evidence of ulcer or pre-ulcerative changes or infection. Orthopedic Exam: Muscle tone and strength are WNL. No limitations in general ROM. No crepitus or effusions noted. HAV  B/L. Skin: No Porokeratosis. No infection or ulcers. Asymptomatic   Heloma durum fifth left.  Diagnosis:  Onychomycosis, , Pain in right toe, pain in left toes,     Treatment & Plan Procedures and Treatment: Consent by patient was obtained for treatment procedures. The patient understood the discussion of treatment and procedures well. All questions were answered thoroughly reviewed. Debridement of mycotic and hypertrophic toenails, 1 through 5 bilateral and clearing of subungual debris. No ulceration, no infection noted.  RTC 5 weeks. Return Visit-Office Procedure: Patient instructed to return to the office for a follow up visit when scheduled  for continued evaluation and treatment.    Zacchaeus Halm DPM 

## 2017-12-10 ENCOUNTER — Other Ambulatory Visit: Payer: Self-pay

## 2017-12-13 ENCOUNTER — Other Ambulatory Visit: Payer: Self-pay | Admitting: *Deleted

## 2017-12-13 MED ORDER — COLCHICINE 0.6 MG PO CAPS: 0.6000 mg | ORAL_CAPSULE | Freq: Every day | ORAL | 1 refills | Status: DC

## 2017-12-14 ENCOUNTER — Ambulatory Visit: Payer: Medicaid Other | Admitting: Cardiology

## 2017-12-18 ENCOUNTER — Ambulatory Visit: Payer: Medicaid Other | Admitting: Cardiovascular Disease

## 2017-12-18 ENCOUNTER — Encounter: Payer: Self-pay | Admitting: Cardiovascular Disease

## 2017-12-18 ENCOUNTER — Other Ambulatory Visit: Payer: Self-pay

## 2017-12-18 VITALS — BP 100/74 | HR 86 | Ht 74.0 in | Wt 225.0 lb

## 2017-12-18 DIAGNOSIS — E7800 Pure hypercholesterolemia, unspecified: Secondary | ICD-10-CM

## 2017-12-18 DIAGNOSIS — R079 Chest pain, unspecified: Secondary | ICD-10-CM | POA: Diagnosis not present

## 2017-12-18 DIAGNOSIS — I200 Unstable angina: Secondary | ICD-10-CM | POA: Diagnosis not present

## 2017-12-18 DIAGNOSIS — Z720 Tobacco use: Secondary | ICD-10-CM

## 2017-12-18 DIAGNOSIS — E785 Hyperlipidemia, unspecified: Secondary | ICD-10-CM | POA: Insufficient documentation

## 2017-12-18 NOTE — Assessment & Plan Note (Signed)
History of atypical chest pain occurring on a daily dose basis mostly at night when he lies down.  He does have positive cardiac risk factors although his pain does not sound particularly anginal more like reflux which she has a history of.  I am going to get a Myoview stress test to further evaluate.

## 2017-12-18 NOTE — Assessment & Plan Note (Signed)
45 pack years of tobacco abuse no smoking intermittently.

## 2017-12-18 NOTE — Progress Notes (Signed)
12/18/2017 Dylan AmesPhilip Jordan   [birth year]  [id number]  Primary Physician [name], [name], DO Primary Cardiologist: Runell GessJonathan J Ivie Savitt MD Milagros LollFACP, FACC, [place], [place]  HPI:  Dylan Jordan is a 62 y.o. moderately overweight single African-American male with no children who currently does not work/is retired.  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  [place] relocated [place] 2 years ago.  His risk factors include treated hypertension, diabetes and hyperlipidemia.  He was referred by Dr. Wonda Oldsiccio for evaluation of nocturnal chest pain which she is had for several months but is late night when he lies down lasting several minutes at a time.   Current Meds  Medication Sig  . ACCU-CHEK AVIVA PLUS test strip use as directed to check blood sugar THREE TIMES DAILY  . ACCU-CHEK SOFTCLIX LANCETS lancets use as directed *may use THREE TIMES DAILY to check blood sugar*  . acetaminophen (TYLENOL) 650 MG CR tablet Take 650 mg by mouth 2 (two) times daily as needed for pain.   Marland Kitchen. allopurinol (ZYLOPRIM) 100 MG tablet Take 2 tablets (200 mg total) by mouth daily.  Marland Kitchen. amLODipine (NORVASC) 5 MG tablet Take 1 TABLET BY MOUTH EVERY DAY  . atorvastatin (LIPITOR) 20 MG tablet take 1 TABLET BY MOUTH EVERY DAY  . benztropine (COGENTIN) 2 MG tablet Take 2 mg by mouth 2 (two) times daily.  . Colchicine (MITIGARE) 0.6 MG CAPS Take 0.6 mg by mouth daily. Take 2 tablets once, then take 1 tablet one hour later.  . divalproex (DEPAKOTE) 500 MG DR tablet Take 500-2,000 mg by mouth See admin instructions. Take 500mg  every morning and 2000 tabs every evening  . empagliflozin (JARDIANCE) 25 MG TABS tablet Take 25 mg by mouth daily.  . ferrous sulfate (FERROUSUL) 325 (65 FE) MG tablet Take 1 tablet (325 mg total) by mouth daily with breakfast.  . fluticasone (FLOVENT HFA) 110 MCG/ACT inhaler Inhale 2 puffs into the lungs 2 (two) times daily. (Patient taking differently: Inhale 2 puffs  into the lungs 2 (two) times daily as needed. )  . gabapentin (NEURONTIN) 300 MG capsule Take 1 capsule (300 mg total) by mouth 3 (three) times daily as needed. (Patient taking differently: Take 300-600 mg by mouth 3 (three) times daily as needed (pain). )  . glimepiride (AMARYL) 4 MG tablet Take 1 tablet (4 mg total) by mouth daily. (Patient taking differently: Take 4 mg by mouth daily with breakfast. )  . haloperidol (HALDOL) 5 MG tablet Take 5 mg by mouth at bedtime.  . haloperidol decanoate (HALDOL DECANOATE) 100 MG/ML injection Inject 400 mg into the muscle every 28 (twenty-eight) days.  Marland Kitchen. lithium carbonate 300 MG capsule Take 300 mg by mouth 2 (two) times daily with a meal.  . losartan (COZAAR) 25 MG tablet TAKE 1 TABLET BY MOUTH EVERY DAY (Patient taking differently: TAKE 25mg  BY MOUTH EVERY DAY)  . metFORMIN (GLUCOPHAGE-XR) 750 MG 24 hr tablet Take 1 tablet (750 mg total) by mouth 2 (two) times daily with a meal. (Patient taking differently: Take 750 mg by mouth daily with breakfast. )  . Multiple Vitamin (DAILY-VITE) TABS TAKE 1 TABLET BY MOUTH DAILY.  . nicotine polacrilex (EQ NICOTINE) 4 MG lozenge Take 1 lozenge (4 mg total) by mouth as needed for smoking cessation.  Marland Kitchen. omeprazole (PRILOSEC) 20 MG capsule Take 2 capsules (40 mg total) by mouth every evening.     Allergies  Allergen  Reactions  . Chantix [Varenicline Tartrate] Other (See Comments)    Nightmares  . Ativan [Lorazepam] Other (See Comments)    Agitation (and "5 others like it") Will need to call [facility] (phone) [phone] and [phone] to fax a release-   . Penicillins Diarrhea    Has patient had a PCN reaction causing immediate rash, facial/tongue/throat swelling, SOB or lightheadedness with hypotension: No Has patient had a PCN reaction causing severe rash involving mucus membranes or skin necrosis: No Has patient had a PCN reaction that required hospitalization: No Has patient had a PCN  reaction occurring within the last 10 years: No If all of the above answers are "NO", then may proceed with Cephalosporin use.     Social History   Socioeconomic History  . Marital status: Single    Spouse name: Not on file  . Number of children: Not on file  . Years of education: Not on file  . Highest education level: Not on file  Occupational History  . Not on file  Social Needs  . Financial resource strain: Not on file  . Food insecurity:    Worry: Not on file    Inability: Not on file  . Transportation needs:    Medical: Not on file    Non-medical: Not on file  Tobacco Use  . Smoking status: Current Every Day Smoker    Packs/day: 0.50    Years: 45.00    Pack years: 22.50    Types: Cigarettes    Start date: 05/16/1971  . Smokeless tobacco: Never Used  Substance and Sexual Activity  . Alcohol use: No  . Drug use: No  . Sexual activity: Not on file  Lifestyle  . Physical activity:    Days per week: Not on file    Minutes per session: Not on file  . Stress: Not on file  Relationships  . Social connections:    Talks on phone: Not on file    Gets together: Not on file    Attends religious service: Not on file    Active member of club or organization: Not on file    Attends meetings of clubs or organizations: Not on file    Relationship status: Not on file  . Intimate partner violence:    Fear of current or ex partner: Not on file    Emotionally abused: Not on file    Physically abused: Not on file    Forced sexual activity: Not on file  Other Topics Concern  . Not on file  Social History Narrative  . Not on file     Review of Systems: General: negative for chills, fever, night sweats or weight changes.  Cardiovascular: negative for chest pain, dyspnea on exertion, edema, orthopnea, palpitations, paroxysmal nocturnal dyspnea or shortness of breath Dermatological: negative for rash Respiratory: negative for cough or wheezing Urologic: negative for  hematuria Abdominal: negative for nausea, vomiting, diarrhea, bright red blood per rectum, melena, or hematemesis Neurologic: negative for visual changes, syncope, or dizziness All other systems reviewed and are otherwise negative except as noted above.    Blood pressure 100/74, pulse 86, height 6\' 2"  (1.88 m), weight 225 lb (102.1 kg).  General appearance: alert and no distress Neck: no adenopathy, no carotid bruit, no JVD, supple, symmetrical, trachea midline and thyroid not enlarged, symmetric, no tenderness/mass/nodules Lungs: clear to auscultation bilaterally Heart: regular rate and rhythm, S1, S2 normal, no murmur, click, rub or gallop Extremities: extremities normal, atraumatic,  no cyanosis or edema Pulses: 2+ and symmetric Skin: Skin color, texture, turgor normal. No rashes or lesions Neurologic: Alert and oriented X 3, normal strength and tone. Normal symmetric reflexes. Normal coordination and gait  EKG sinus rhythm 86 with nonspecific ST and T wave changes.  I personally reviewed this EKG.  ASSESSMENT AND PLAN:   Tobacco abuse 45 pack years of tobacco abuse no smoking intermittently.  HTN (hypertension) History of essential hypertension her blood pressure measured today at 100/74.  He is on amlodipine and losartan.  Continue current meds at current dosing.  Hyperlipidemia History of hyperlipidemia on statin therapy  Intermittent chest pain History of atypical chest pain occurring on a daily dose basis mostly at night when he lies down.  He does have positive cardiac risk factors although his pain does not sound particularly anginal more like reflux which she has a history of.  I am going to get a Myoview stress test to further evaluate.      Runell Gess MD FACP,FACC,FAHA, [facility] 12/18/2017 2:21 PM

## 2017-12-18 NOTE — Patient Instructions (Signed)
Medication Instructions:  Your physician recommends that you continue on your current medications as directed. Please refer to the Current Medication list given to you today.  Labwork: none  Testing/Procedures: Your physician has requested that you have en exercise stress myoview. For further information please visit www.[email or web address]. Please follow instruction sheet, as given.    Follow-Up: Follow up with Dr. Berry as needed.   Any Other Special Instructions Will Be Listed Below (If Applicable).     If you need a refill on your cardiac medications before your next appointment, please call your pharmacy.   

## 2017-12-18 NOTE — Assessment & Plan Note (Signed)
History of essential hypertension her blood pressure measured today at 100/74.  He is on amlodipine and losartan.  Continue current meds at current dosing.

## 2017-12-18 NOTE — Assessment & Plan Note (Signed)
History of hyperlipidemia on statin therapy. 

## 2017-12-19 MED ORDER — GABAPENTIN 300 MG PO CAPS: 300.0000 mg | ORAL_CAPSULE | Freq: Three times a day (TID) | ORAL | 1 refills | Status: DC | PRN

## 2017-12-20 ENCOUNTER — Telehealth: Payer: Self-pay

## 2017-12-20 NOTE — Telephone Encounter (Signed)
Patient would like PCP to call him about a cough and his diabetic medications. Long message saved in nurse clinic for PCP to hear.  Call back is [phone]  Ples SpecterAlisa Brake, RN [facility] RN)

## 2017-12-24 NOTE — Telephone Encounter (Signed)
Patient has an appointment with Dr. Wonda Oldsiccio on 01-03-18. Jazmin Hartsell,CMA

## 2017-12-24 NOTE — Telephone Encounter (Signed)
Attempted to call [name] back. No answer, left voicemail. Overall I feel he should come in to see pharmacy clinic to further discuss diabetes and medications. Please have him schedule an appointment with pharmacy clinic if he calls back.  Dolores PattyAngela Dalinda Heidt, DO PGY-3, [facility] 12/24/2017 9:36 AM

## 2017-12-25 ENCOUNTER — Telehealth (HOSPITAL_COMMUNITY): Payer: Self-pay

## 2017-12-25 NOTE — Telephone Encounter (Signed)
Encounter complete. 

## 2017-12-27 ENCOUNTER — Ambulatory Visit (HOSPITAL_COMMUNITY)
Admission: RE | Admit: 2017-12-27 | Discharge: 2017-12-27 | Disposition: A | Discharge status: Home / Self Care | Payer: Medicaid Other | Source: Ambulatory Visit | Attending: Cardiology | Admitting: Cardiology

## 2017-12-27 DIAGNOSIS — Z720 Tobacco use: Secondary | ICD-10-CM | POA: Diagnosis not present

## 2017-12-27 DIAGNOSIS — I200 Unstable angina: Secondary | ICD-10-CM | POA: Diagnosis not present

## 2017-12-27 LAB — MYOCARDIAL PERFUSION IMAGING
CHL CUP NUCLEAR SRS: 3
CHL CUP NUCLEAR SSS: 3
LV dias vol: 107 mL (ref 62–150)
LVSYSVOL: 43 mL
NUC STRESS TID: 1.12
Peak HR: 81 {beats}/min
Rest HR: 64 {beats}/min
SDS: 0

## 2017-12-27 MED ORDER — TECHNETIUM TC 99M TETROFOSMIN IV KIT
10.4000 | PACK | Freq: Once | INTRAVENOUS | Status: AC | PRN
  Administered 2017-12-27: 10.4 via INTRAVENOUS
  Filled 2017-12-27: qty 11

## 2017-12-27 MED ORDER — REGADENOSON 0.4 MG/5ML IV SOLN
0.4000 mg | Freq: Once | INTRAVENOUS | Status: AC
  Administered 2017-12-27: 0.4 mg via INTRAVENOUS

## 2017-12-27 MED ORDER — TECHNETIUM TC 99M TETROFOSMIN IV KIT
30.8000 | PACK | Freq: Once | INTRAVENOUS | Status: AC | PRN
  Administered 2017-12-27: 30.8 via INTRAVENOUS
  Filled 2017-12-27: qty 31

## 2017-12-28 ENCOUNTER — Encounter: Payer: Self-pay | Admitting: Pharmacist

## 2017-12-28 ENCOUNTER — Ambulatory Visit: Payer: Medicaid Other | Admitting: Pharmacist

## 2017-12-28 DIAGNOSIS — Z720 Tobacco use: Secondary | ICD-10-CM | POA: Diagnosis not present

## 2017-12-28 DIAGNOSIS — E7800 Pure hypercholesterolemia, unspecified: Secondary | ICD-10-CM

## 2017-12-28 DIAGNOSIS — I10 Essential (primary) hypertension: Secondary | ICD-10-CM

## 2017-12-28 DIAGNOSIS — E1140 Type 2 diabetes mellitus with diabetic neuropathy, unspecified: Secondary | ICD-10-CM | POA: Diagnosis not present

## 2017-12-28 DIAGNOSIS — F1994 Other psychoactive substance use, unspecified with psychoactive substance-induced mood disorder: Secondary | ICD-10-CM | POA: Diagnosis not present

## 2017-12-28 MED ORDER — LIRAGLUTIDE 18 MG/3ML ~~LOC~~ SOPN: 0.6000 mg | PEN_INJECTOR | Freq: Every day | SUBCUTANEOUS | 0 refills | Status: DC

## 2017-12-28 NOTE — Assessment & Plan Note (Signed)
Tobacco use disorder - Congratulated patient on significantly decreasing cigarette use. Educated patient that he can remove the nicotine lozenge from his mouth if he thinks it is taking too long to dissolve, but to continue to use them to help decrease tobacco use. Reevaluate tobacco use at next visit.

## 2017-12-28 NOTE — Progress Notes (Signed)
S:     Chief Complaint  Patient presents with  . Medication Management    Diabetes    Patient arrives in good spirits ambulating without assistance.  Presents for diabetes evaluation, education, and management at the request of Dr. Wonda Oldsiccio referred on 08/29/17.  Patient was last seen by Primary Care Provider on 11/22/2017.   Patient comes in describing an out of body experience where he experienced dreaming while awake. He also mentions he received his last haloperidol decanoate injection two weeks ago. He states having an appointment scheduled with his psychiatrist coming up.   Patient mentions diarrhea symptoms have decreased since decreasing the dose of Metformin XR to 750mg  daily - mentions still having loose stools at time but occurs less frequently now. Patient also reports increased thirst and waking up 3-5 times per night to urinate.  Patient reports decreased cigarettes use to 6 cigarettes total in the past 2 weeks. He states he has been trying to use nicotine lozenges 4 mg but does not like that they take a long time to dissolve. Patient seems unsatisfied with his progress and verbalizes he knows smoking is harmful and wants to quit. Patient also reports increased cough recently with associated dry mouth. When asked, patient reports not rinsing his mouth out after using his Flovent (fluticasone) inhaler.   Insurance coverage/medication affordability: [place] Medicaid   Patient reports adherence with medications.  Current diabetes medications include: Metformin XR 750 mg daily, glimiperide 4 mg daily, Jardiance (empagliflozin) 25 mg daily   Current hypertension medications include: amlodipine 5 mg, losartan 25 mg daily   Patient denies hypoglycemic events.  Patient reports drinking soda and a lot of water due to increased thirst.    Patient reports nocturia. Reports getting up about 3-5 times per night.   O:  Physical Exam  Constitutional: He appears well-developed and  well-nourished.  Vitals reviewed.    Review of Systems  All other systems reviewed and are negative.    Lab Results  Component Value Date   HGBA1C 8.2 (A) 11/22/2017   There were no vitals filed for this visit.  Lipid Panel  No results found for: CHOL, TRIG, HDL, CHOLHDL, VLDL, LDLCALC, LDLDIRECT  7-day average: 295 14-day average: 301  Clinical ASCVD: No   A/P: Diabetes longstanding currently uncontrolled. Patient is able to verbalize appropriate hypoglycemia management plan. Patient is adherent with medication. Control is suboptimal due to poor dietary intake (particularly with full sugar sodas).  -Start Victoza (liraglutide) 0.6 mg daily. Provided patient a sample of Victoza in clinic. Counseled patient on possible GI side effects including nausea and vomiting. Instructed patient to call Dr. Raymondo BandKoval if he starts experiencing these symptoms after initiation of Victoza. Counseled patient on the use of Victoza pen and allowed patient to demonstrate understanding utilizing the teach-back method.   -Continue Metformin XL 750mg  daily.  -Continue glimiperide 4 mg daily.  -Continue Jardiance (empagliflozin) 25 mg daily.  -Extensively discussed recommended lifestyle interventions and dietary effects on glycemic control. Educated patient on decreasing soda intake and substituting with diet alternative if he wants to have a soda. Also educated patient to start consuming more veggies and limit intake of boxed and canned foods.  -Next A1C anticipated at next visit.    Hypertension longstanding currently controlled at 118/82 at today's visit.  BP goal = <130/80 mmHg. Patient is adherent with medication. -Continue amlodipine 5 mg daily.  -Continue losartan 25 mg daily.   Tobacco use disorder - Congratulated patient on  significantly decreasing cigarette use. Educated patient that he can remove the nicotine lozenge from his mouth if he thinks it is taking too long to dissolve, but to continue to  use them to help decrease tobacco use. Reevaluate tobacco use at next visit.   History of hyperlipidema on atorvastatin 20mg  daily without recent assessment of control. - ordered Lipid panel.  Dry mouth/cough complaint - Instructed patient to always rinse his mouth out after using his Flovent (fluticasone) inhaler to decrease his dry mouth symptoms.   Sent patient to lab fort the following draws: lipid panel, lithium level, TSH, and BMET.   Written patient instructions provided.  Total time in face to face counseling 40 minutes.   Follow up Pharmacist Clinic Visit in 2 weeks.   Patient seen with Caffie PintoAkshara Kumar, PharmD Candidate, Gwynneth AlbrightSara Nimer, PharmD, PGY1 Pharmacy Resident.

## 2017-12-28 NOTE — Assessment & Plan Note (Signed)
History of hyperlipidema on atorvastatin 20mg  daily without recent assessment of control. - ordered Lipid panel.

## 2017-12-28 NOTE — Patient Instructions (Addendum)
It was nice seeing you today!  1. Remember to always rinse your mouth AFTER using your Flovent (fluticasone) inhaler.  2. Start Victoza (liraglutide) 0.6mg  daily. If you are feeling nauseous after starting this medication, call Dr. Raymondo BandKoval.  3. Remember to eat more veggies, less boxed and canned foods, and limit soda. If you need to drink soda, choose a diet soda instead.  4. Please follow up with Dr. Raymondo BandKoval in 2 weeks.

## 2017-12-28 NOTE — Assessment & Plan Note (Signed)
Diabetes longstanding currently uncontrolled. Patient is able to verbalize appropriate hypoglycemia management plan. Patient is adherent with medication. Control is suboptimal due to poor dietary intake (particularly with full sugar sodas).  -Start Victoza (liraglutide) 0.6 mg daily. Provided patient a sample of Victoza in clinic. Counseled patient on possible GI side effects including nausea and vomiting. Instructed patient to call Dr. Raymondo BandKoval if he starts experiencing these symptoms after initiation of Victoza. Counseled patient on the use of Victoza pen and allowed patient to demonstrate understanding utilizing the teach-back method.   -Continue Metformin XL 750mg  daily.  -Continue glimiperide 4 mg daily.  -Continue Jardiance (empagliflozin) 25 mg daily.  -Extensively discussed recommended lifestyle interventions and dietary effects on glycemic control. Educated patient on decreasing soda intake and substituting with diet alternative if he wants to have a soda. Also educated patient to start consuming more veggies and limit intake of boxed and canned foods.  -Next A1C anticipated at next visit.

## 2017-12-28 NOTE — Assessment & Plan Note (Signed)
Hypertension longstanding currently controlled at 118/82 at today's visit.  BP goal = <130/80 mmHg. Patient is adherent with medication. -Continue amlodipine 5 mg daily.  -Continue losartan 25 mg daily.

## 2017-12-29 NOTE — Progress Notes (Signed)
Patient ID: Dylan Jordan, male   DOB: 06-18-1955, 62 y.o.   MRN: [id number] Reviewed: Agree with Dr. Macky LowerKoval's documentation and management.

## 2017-12-31 ENCOUNTER — Telehealth: Payer: Self-pay

## 2017-12-31 NOTE — Telephone Encounter (Signed)
Patient left message asking to speak with Dr Raymondo BandKoval regarding medication changes at last appt and some side effects he may be experiencing.  Call back is [phone]  Ples SpecterAlisa Brake, RN [facility] RN)

## 2018-01-01 NOTE — Telephone Encounter (Signed)
Phone call to patient to follow up on starting of Victoza.  Patient was worried that the Victoza was creating a problem with his Haloperidol however he did not have a complaint.   He was reassured that Victoza/Liraglutide was acceptable for use.   Blood glucose readings were reported to be in the 200s (reduced from 400s).   Plan to f/u with both Rx clinic and with PCP on Thursday (2 days from now).  Reevaluate efficacy at that time.

## 2018-01-02 ENCOUNTER — Ambulatory Visit: Payer: Medicaid Other | Admitting: Podiatry

## 2018-01-03 ENCOUNTER — Encounter: Payer: Self-pay | Admitting: Pharmacist

## 2018-01-03 ENCOUNTER — Other Ambulatory Visit: Payer: Self-pay

## 2018-01-03 ENCOUNTER — Ambulatory Visit: Payer: Medicaid Other | Admitting: Family Medicine

## 2018-01-03 ENCOUNTER — Encounter: Payer: Self-pay | Admitting: Family Medicine

## 2018-01-03 ENCOUNTER — Ambulatory Visit (INDEPENDENT_AMBULATORY_CARE_PROVIDER_SITE_OTHER): Payer: Medicaid Other | Admitting: Pharmacist

## 2018-01-03 VITALS — BP 107/67 | HR 96 | Temp 98.3°F | Wt 223.0 lb

## 2018-01-03 DIAGNOSIS — I10 Essential (primary) hypertension: Secondary | ICD-10-CM | POA: Diagnosis not present

## 2018-01-03 DIAGNOSIS — N39490 Overflow incontinence: Secondary | ICD-10-CM | POA: Diagnosis not present

## 2018-01-03 DIAGNOSIS — E1140 Type 2 diabetes mellitus with diabetic neuropathy, unspecified: Secondary | ICD-10-CM | POA: Diagnosis not present

## 2018-01-03 LAB — POCT URINALYSIS DIP (MANUAL ENTRY)
Bilirubin, UA: NEGATIVE
Blood, UA: NEGATIVE
Ketones, POC UA: NEGATIVE mg/dL
Leukocytes, UA: NEGATIVE
Nitrite, UA: NEGATIVE
Protein Ur, POC: NEGATIVE mg/dL
Urobilinogen, UA: 0.2 E.U./dL
pH, UA: 6 (ref 5.0–8.0)

## 2018-01-03 MED ORDER — LIRAGLUTIDE 18 MG/3ML ~~LOC~~ SOPN: 1.2000 mg | PEN_INJECTOR | Freq: Every day | SUBCUTANEOUS | 0 refills | Status: DC

## 2018-01-03 MED ORDER — METFORMIN HCL ER 750 MG PO TB24: 750.0000 mg | ORAL_TABLET | Freq: Every day | ORAL | 3 refills | Status: DC

## 2018-01-03 NOTE — Progress Notes (Signed)
    Subjective:    Patient ID: Dylan Jordan, male    DOB: 1956-03-13, 62 y.o.   MRN: [id number]   CC: follow up incontinence/nocturnal enuresis   Incontinence- Reports things are the same. Has not ruined any mattresses. Rushing to bathroom, not making it at times. Having accidents. Has not incorporated any of the bladder training or kegal exercises. He denies burning with urination. He denies difficulty initiating a urine stream or dribbling. He continues to drink many sugary drinks.   DM- Drinking sodas, had malt milshake yesterday, lemon sweet tea Drinks 2 sodas (40 oz total) then has to pee very badly. He reports increased hunger, thirst, and urination. He has blurred vision.   Low BP- reports feeling off balance and dizzy with standing. BP after walking back to room low 89/55, repeat after sitting for 5 minutes improved to 118/78. Orthostatics negative- 102/83 laying, 107/67 sitting, 106/72 standing. He is on losartan for BP. He denies any episodes of syncope.   Smoking status reviewed- current smoker  Review of Systems- denies CP, SOB, focal weakness, saddle paresthesias. Endorses low back pain.    Objective:  BP 107/67   Pulse 96   Temp 98.3 F (36.8 C) (Oral)   Wt 223 lb (101.2 kg)   SpO2 94%   BMI 29.42 kg/m  Vitals and nursing note reviewed  General: well nourished, in no acute distress HEENT: normocephalic, MMM Cardiac: RRR, clear S1 and S2, no murmurs, rubs, or gallops Respiratory: clear to auscultation bilaterally, no increased work of breathing Extremities: no edema or cyanosis. Neuro: alert and oriented, no focal deficits  Assessment & Plan:    Type 2 diabetes mellitus with diabetic neuropathy (HCC)  Last A1C 8.2. Next due October 11.  Uncontrolled 2/2 diet, consumes 2-4 20 oz sodas a day as well as sweet teas and fast foods.  Discussed with Dr. Raymondo BandKoval, will increase GLP to 1.2 mg today. He went in to discuss this with patient and will see him formally next  week.   Mixed stress and urge urinary incontinence  Likely secondary to osmotic diuresis from elevated CBGs. UA today showed >500 glucose in urine. Will obtain PSA to r/o prostate enlargement. Will refer to urology for further evaluation. Again discussed timed toileting, bladder training. Discussed again discontinuing sodas, sweet teas, etc as this is exacerbating problem. Follow up 3 weeks. Check kidney function today.  HTN (hypertension)  Blood pressure low today initially upon walking back to room, repeat sitting and orthostatics were normal. Continue losartan at this time. Follow up 3 weeks.     Return in about 18 days (around 01/21/2018).   Dolores PattyAngela Jontavia Leatherbury, DO Family Medicine Resident PGY-3

## 2018-01-03 NOTE — Patient Instructions (Addendum)
  I have referred you to urology to continue work up on incontinence. I feel the main issue is your sugars are too high, please work on diet and discontinue sugary drinks.  I'll see you September 9th 2019.  If you have questions or concerns please do not hesitate to call at [phone].  Dolores PattyAngela Crystian Frith, DO PGY-3, [facility] 01/03/2018 2:20 PM

## 2018-01-03 NOTE — Assessment & Plan Note (Addendum)
  Last A1C 8.2. Next due October 11.  Uncontrolled 2/2 diet, consumes 2-4 20 oz sodas a day as well as sweet teas and fast foods.  Discussed with Dr. Raymondo BandKoval, will increase GLP to 1.2 mg today. He went in to discuss this with patient and will see him formally next week.

## 2018-01-03 NOTE — Progress Notes (Signed)
     S:     Chief Complaint  Patient presents with  . Medication Management    Diabetes    Patient arrives in good spirits ambulating without assistance.  Presents for diabetes evaluation, education, and management at the request of Dr. Wonda Oldsiccio referred on 08/29/17.  Patient was last seen by Primary Care Provider today in clinic. He was last seen in pharmacy clinic on 12/28/17 where Victoza (liraglutide) 0.6 mg daily was initiated. He contacted clinic on 12/31/17 with concerns about combining haloperidol and Victoza (liraglutide), but was reassured that there was no concern of drug interactions.   Patient comes in with complaints of a dry cough for the past 2 weeks that he plans to speak with Dr. Cherie Darkicco about. However, he denies GI upset with initiation of Victoza (liraglutide) 0.6 mg daily at last pharmacy visit. He continues to take metformin XR 750 mg once daily and is tolerating this.   Insurance coverage/medication affordability: [place] Medicaid   Patient reports adherence with medications.  Current diabetes medications include: Metformin XR 750 mg daily, glimiperide 4 mg daily, Jardiance (empagliflozin) 25 mg daily, Victoza (liraglutide) 0.6 mg daily Current hypertension medications include: amlodipine 5 mg, losartan 25 mg daily   Dietary: He notes that yesterday, he had soda and a chocolate milkshake which elevated his SMBG results.   O:  Physical Exam  Constitutional: He appears well-developed and well-nourished.  Vitals reviewed.  Review of Systems  All other systems reviewed and are negative.  Lab Results  Component Value Date   HGBA1C 8.2 (A) 11/22/2017   There were no vitals filed for this visit.  Lipid Panel  No results found for: CHOL, TRIG, HDL, CHOLHDL, VLDL, LDLCALC, LDLDIRECT  SMBG - Reports readings 170s; reading >300 this morning.   Clinical ASCVD: No   A/P: Diabetes longstanding currently uncontrolled based on A1c however he had had several blood sugar  readings ~ 175 in the past few days. Patient is able to verbalize appropriate hypoglycemia management plan. Patient is adherent with medication. Control is suboptimal due to dietary indiscretions. - Increase Victoza (liraglutide) from 0.6mg  to 1.2 mg daily - Continue metformin XL 750 mg daily, glimepiride 4 mg daily, Jardiance (empagliflozin) 25 mg daily.  Reviewed plan with PCP ([name]) while in office.   Written patient instructions provided.  Total time in face to face counseling 40 minutes.   Follow up Pharmacist Clinic Visit next week.   Patient seen with Catie Feliz Beamravis, PharmD, PGY2 Pharmacy Resident.

## 2018-01-04 LAB — BASIC METABOLIC PANEL
BUN/Creatinine Ratio: 9 — ABNORMAL LOW (ref 10–24)
BUN: 13 mg/dL (ref 8–27)
CO2: 21 mmol/L (ref 20–29)
CREATININE: 1.43 mg/dL — AB (ref 0.76–1.27)
Calcium: 9.8 mg/dL (ref 8.6–10.2)
Chloride: 99 mmol/L (ref 96–106)
GFR calc Af Amer: 61 mL/min/{1.73_m2} (ref >59)
GFR, EST NON AFRICAN AMERICAN: 52 mL/min/{1.73_m2} — AB (ref >59)
GLUCOSE: 233 mg/dL — AB (ref 65–99)
Potassium: 4.6 mmol/L (ref 3.5–5.2)
SODIUM: 137 mmol/L (ref 134–144)

## 2018-01-04 LAB — PSA: Prostate Specific Ag, Serum: 0.5 ng/mL (ref 0.0–4.0)

## 2018-01-04 NOTE — Progress Notes (Signed)
Patient ID: Dylan Jordan, male   DOB: [birth year], 62 y.o.   MRN: [id number] Reviewed: Agree with Dr. Macky LowerKoval's documentation and management.

## 2018-01-04 NOTE — Assessment & Plan Note (Addendum)
  Likely secondary to osmotic diuresis from elevated CBGs. UA today showed >500 glucose in urine. Will obtain PSA to r/o prostate enlargement. Will refer to urology for further evaluation. Again discussed timed toileting, bladder training. Discussed again discontinuing sodas, sweet teas, etc as this is exacerbating problem. Follow up 3 weeks. Check kidney function today.

## 2018-01-04 NOTE — Patient Instructions (Signed)
Plan according to PCP instructions - increase Victoza to 1.2mg  daily  Follow up in 1 week.

## 2018-01-04 NOTE — Assessment & Plan Note (Addendum)
  Blood pressure low today initially upon walking back to room, repeat sitting and orthostatics were normal. Continue losartan at this time. Follow up 3 weeks.

## 2018-01-04 NOTE — Assessment & Plan Note (Signed)
Diabetes longstanding currently uncontrolled based on A1c however he had had several blood sugar readings ~ 175 in the past few days. Patient is able to verbalize appropriate hypoglycemia management plan. Patient is adherent with medication. Control is suboptimal due to dietary indiscretions. - Increase Victoza (liraglutide) from 0.6mg  to 1.2 mg daily - Continue metformin XL 750 mg daily, glimepiride 4 mg daily, Jardiance (empagliflozin) 25 mg daily

## 2018-01-10 ENCOUNTER — Ambulatory Visit (INDEPENDENT_AMBULATORY_CARE_PROVIDER_SITE_OTHER): Payer: Medicaid Other | Admitting: Pharmacist

## 2018-01-10 ENCOUNTER — Encounter: Payer: Self-pay | Admitting: Pharmacist

## 2018-01-10 ENCOUNTER — Telehealth: Payer: Self-pay

## 2018-01-10 DIAGNOSIS — J4530 Mild persistent asthma, uncomplicated: Secondary | ICD-10-CM

## 2018-01-10 DIAGNOSIS — E1140 Type 2 diabetes mellitus with diabetic neuropathy, unspecified: Secondary | ICD-10-CM | POA: Diagnosis present

## 2018-01-10 DIAGNOSIS — I10 Essential (primary) hypertension: Secondary | ICD-10-CM | POA: Diagnosis not present

## 2018-01-10 DIAGNOSIS — M109 Gout, unspecified: Secondary | ICD-10-CM | POA: Diagnosis not present

## 2018-01-10 DIAGNOSIS — M1A9XX0 Chronic gout, unspecified, without tophus (tophi): Secondary | ICD-10-CM

## 2018-01-10 DIAGNOSIS — Z720 Tobacco use: Secondary | ICD-10-CM

## 2018-01-10 MED ORDER — LOSARTAN POTASSIUM 50 MG PO TABS: 50.0000 mg | ORAL_TABLET | Freq: Every day | ORAL | 3 refills | Status: DC

## 2018-01-10 MED ORDER — GLIMEPIRIDE 4 MG PO TABS: 4.0000 mg | ORAL_TABLET | Freq: Every day | ORAL | 1 refills | Status: DC

## 2018-01-10 MED ORDER — ALLOPURINOL 100 MG PO TABS: 200.0000 mg | ORAL_TABLET | Freq: Every day | ORAL | 3 refills | Status: DC

## 2018-01-10 MED ORDER — LIRAGLUTIDE 18 MG/3ML ~~LOC~~ SOPN: 1.8000 mg | PEN_INJECTOR | Freq: Every day | SUBCUTANEOUS | 0 refills | Status: DC

## 2018-01-10 NOTE — Assessment & Plan Note (Signed)
Asthma, mild persistent currently uncontrolled, Patient notes that he is only taking Flovent as needed, along with albuterol inhaler. Endorses some coughing, shortness of breath, particularly with colder air - Recommended to take Flovent at least once daily, continue albuterol PRN

## 2018-01-10 NOTE — Assessment & Plan Note (Signed)
Hypertension longstanding currently controlled, but with symptomatic hypoglycemia.  BP goal <= 130/80.Marland Kitchen. Patient is adherent with medication. Optimization of losartan may be beneficial in reducing gout flares.  - Discontinue amlodipine - Increase losartan to 50 mg daily. (May also help with reducing gout flares).  - Check BMP in 7-10 days

## 2018-01-10 NOTE — Assessment & Plan Note (Signed)
Tobacco Abuse; mild and improved; Patient notes success with using lozenges to decrease cigarette use - Congratulated patient on his success with cigarette reduction. Encouraged him to completely replace cigarettes with lozenges

## 2018-01-10 NOTE — Telephone Encounter (Signed)
Patient saw Dr. Raymondo BandKoval today and dose of losartan was increased to 50 mg daily. Called pharmacy to confirm dose of 50 mg daily and LM for pharmacist.  Dolores PattyAngela Starkisha Tullis, DO PGY-3, [facility] 01/10/2018 3:38 PM

## 2018-01-10 NOTE — Progress Notes (Signed)
S:     Chief Complaint  Patient presents with  . Medication Management    Diabetes    Patient arrives arrives in good spirits, ambulating without assistance.  Presents for diabetes evaluation, education, and management at the request of Dr. Wonda Oldsiccio (referred on 08/29/17).  Patient was last seen by Primary Care Provider on 01/03/2018. He was last seen in Pharmacy Clinic on the same date - at that time, Victoza was increased to 1.2 mg daily.   He notes some GI upset with this increased dose, but also indicates that he has been eating some sour-tasting cereal and milk recently. He brings in SMBG meter. He notes that he has some occasional dizziness/shakiness upon positional changes. Patient reports coughing has improved after decreasing smoking, but is still present. Currently he reports using 3-4 cigarettes per week and about one nicotine lozenge per day. Patient is motivated to continue to cut back on cigarettes. Patient also reports he has been using Flovent (fluticasone) only on 3 days out of the week. Additionally, he reports some tingling on the bottom of his feet that he attributes to a gout flare. He took a dose of colchicine last night for this.  Patient reports next dose of haloperidol injection will be next week on 9/5.    Insurance coverage/medication affordability: [place] Medicaid  Patient reports adherence with medications. Current diabetes medications include: Victoza (liraglutide) 1.2mg  daily, metformin XR 750mg  daily, glimepiride 4 mg daily, Jardiance (empagliflozin) 25mg  daily.  - Of note, this is the maximum tolerated dose of metformin (diarrhea when he took XR 750 mg BID) - Patient reports some nausea after increasing Victoza (liraglutide) to 1.2mg  daily.  Current hypertension medications include: amlodipine 5 mg daily, losartan 25 mg daily  Patient denies hypoglycemic events.  O:  Physical Exam  Constitutional: He appears well-developed and well-nourished.  Vitals  reviewed.   Review of Systems  All other systems reviewed and are negative.   Lab Results  Component Value Date   HGBA1C 8.2 (A) 11/22/2017   Vitals:   01/10/18 0912  BP: 104/68  Pulse: 74  SpO2: 97%   BMP Latest Ref Rng & Units 01/03/2018 11/22/2017 11/01/2017  Glucose 65 - 99 mg/dL 409(W233(H) 119(J282(H) 478(G167(H)  BUN 8 - 27 mg/dL 13 24 17   Creatinine 0.76 - 1.27 mg/dL 9.56(O1.43(H) 1.301.23 8.65(H1.47(H)  BUN/Creat Ratio 10 - 24 9(L) 20 12  Sodium 134 - 144 mmol/L 137 136 140  Potassium 3.5 - 5.2 mmol/L 4.6 5.5(H) 4.9  Chloride 96 - 106 mmol/L 99 101 105  CO2 20 - 29 mmol/L 21 18(L) 20  Calcium 8.6 - 10.2 mg/dL 9.8 9.9 9.4    Lipid Panel  No results found for: CHOL, TRIG, HDL, CHOLHDL, VLDL, LDLCALC, LDLDIRECT  Patient brings his blood glucose meter in with numbers mostly 200s, some in the upper 100s. Highest BG reading since last visit was 481 7 day average: 231 14 day average: 256 30 day average: 267  Clinical ASCVD: No  ASCVD risk factors : age 61-75  A/P: Diabetes longstanding currently improved since increased Victoza dose. Patient is able to verbalize appropriate hypoglycemia management plan. Patient is adherent with medication. Control is suboptimal due to diet. -Continued metformin XR 750mg , glimepiride  -Increased dose of GLP-1 Victoza (generic name liraglutide) to 5 clicks after 1.2mg  (dose between 1.2 to 1.8mg ); conservative dose increase appropriate given GI upset in the past week. Provided with samples.  -Continued SGLT2-I Jardiance (generic name empagliflozin) 25 mg. Counseled on sick  day rules for Jardiance. -Extensively discussed pathophysiology of DM, recommended lifestyle interventions, dietary effects on glycemic control -Counseled on s/sx of and management of hypoglycemia  Hypertension longstanding currently controlled, but with symptomatic hypoglycemia.  BP goal <= 130/80.Marland Kitchen Patient is adherent with medication. Optimization of losartan may be beneficial in reducing gout  flares.  - Discontinue amlodipine - Increase losartan to 50 mg daily. - Check BMP in 7-10 days  Asthma, mild persistent currently uncontrolled, Patient notes that he is only taking Flovent as needed, along with albuterol inhaler. Endorses some coughing, shortness of breath, particularly with colder air - Recommended to take Flovent at least once daily, continue albuterol PRN  Tobacco Abuse; mild and improved; Patient notes success with using lozenges to decrease cigarette use - Congratulated patient on his success with cigarette reduction. Encouraged him to completely replace cigarettes with lozenges  ASCVD risk - primary prevention in patient with DM.  -Continued atorvastatin 20 mg.    Written patient instructions provided.  Total time in face to face counseling 60 minutes.   Follow up Pharmacist Clinic Visit in 1.5 weeks.   Patient seen with Caffie Pinto, PharmD Candidate, Gwynneth Albright, PharmD, PGY1 Pharmacy Resident, and Catie Feliz Beam, PharmD,  PGY2 Pharmacy Resident.

## 2018-01-10 NOTE — Patient Instructions (Addendum)
1. Increase your use of Flovent inhaler to once daily. 2. You have been doing a great job at cutting back your cigarette use! Start using your nicorette lozenges more to continue to cut down cigarette smoking.   3. INCREASE your Victoza to 5 more clicks after 1.2mg  to help with your sugar. 4. STOP taking your amlodipine. 5. INCREASE your losartan to 50mg  once a day.  We'll see you back in a week!

## 2018-01-10 NOTE — Telephone Encounter (Signed)
[facility] called nurse line regarding Rx for losartan. Received rx for losartan 50mg . Has rx for losartan 25mg  on file. Needs clarification for which dose he needs filled. Call back [phone] Shawna OrleansMeredith B Elya Diloreto, RN

## 2018-01-10 NOTE — Assessment & Plan Note (Signed)
Diabetes longstanding currently improved since increased Victoza dose. Patient is able to verbalize appropriate hypoglycemia management plan. Patient is adherent with medication. Control is suboptimal due to diet. -Continued metformin XR 750mg , glimepiride  -Increased dose of GLP-1 Victoza (generic name liraglutide) to 5 clicks after 1.2mg  (dose between 1.2 to 1.8mg ); conservative dose increase appropriate given GI upset in the past week. Provided with samples.  -Continued SGLT2-I Jardiance (generic name empagliflozin) 25 mg. Counseled on sick day rules for Jardiance. -Extensively discussed pathophysiology of DM, recommended lifestyle interventions, dietary effects on glycemic control -Counseled on s/sx of and management of hypoglycemia

## 2018-01-11 NOTE — Progress Notes (Signed)
Patient ID: Dylan Jordan, male   DOB: 10/01/1955, 62 y.o.   MRN: [id number] Reviewed: Agree with Dr. Koval's documentation and management. 

## 2018-01-16 ENCOUNTER — Telehealth: Payer: Self-pay

## 2018-01-16 NOTE — Telephone Encounter (Signed)
Patient left message asking for Dr Raymondo Band to return call. He does not remember what medications to increase and what to discontinue from last visit with him  Call back is [phone]  Ples Specter, RN [facility] RN)

## 2018-01-16 NOTE — Telephone Encounter (Signed)
Contacted patient and updated on losartan increase from 25mg  to 50mg  AND Stopped Amlodipine.  F/U on 9/9 -   Reevaluate tobacco cessation at that time. Encouraged to continue 2 day complete abstinence.

## 2018-01-18 ENCOUNTER — Encounter: Payer: Self-pay | Admitting: Podiatry

## 2018-01-18 ENCOUNTER — Ambulatory Visit: Payer: Medicaid Other | Admitting: Podiatry

## 2018-01-18 DIAGNOSIS — M79676 Pain in unspecified toe(s): Secondary | ICD-10-CM

## 2018-01-18 DIAGNOSIS — B351 Tinea unguium: Secondary | ICD-10-CM

## 2018-01-18 DIAGNOSIS — M79609 Pain in unspecified limb: Principal | ICD-10-CM

## 2018-01-18 DIAGNOSIS — E1142 Type 2 diabetes mellitus with diabetic polyneuropathy: Secondary | ICD-10-CM

## 2018-01-18 NOTE — Progress Notes (Signed)
Complaint:  Visit Type: Patient returns to my office for continued preventative foot care services. Complaint: Patient states" my nails have grown long and thick and become painful to walk and wear shoes" The patient presents for preventative foot care services. No changes to ROS.    Podiatric Exam: Vascular: dorsalis pedis and posterior tibial pulses are palpable bilateral. Capillary return is immediate. Temperature gradient is WNL. Skin turgor WNL  Sensorium: Normal Semmes Weinstein monofilament test. Normal tactile sensation bilaterally. Nail Exam: Pt has thick disfigured discolored nails with subungual debris noted bilateral entire nail hallux through fifth toenails Ulcer Exam: There is no evidence of ulcer or pre-ulcerative changes or infection. Orthopedic Exam: Muscle tone and strength are WNL. No limitations in general ROM. No crepitus or effusions noted. HAV  B/L. Skin: No Porokeratosis. No infection or ulcers. Asymptomatic   Heloma durum fifth left.  Diagnosis:  Onychomycosis, , Pain in right toe, pain in left toes,     Treatment & Plan Procedures and Treatment: Consent by patient was obtained for treatment procedures. The patient understood the discussion of treatment and procedures well. All questions were answered thoroughly reviewed. Debridement of mycotic and hypertrophic toenails, 1 through 5 bilateral and clearing of subungual debris. No ulceration, no infection noted.  RTC 5 weeks. Return Visit-Office Procedure: Patient instructed to return to the office for a follow up visit when scheduled  for continued evaluation and treatment.    Helane Gunther DPM

## 2018-01-21 ENCOUNTER — Telehealth: Payer: Self-pay | Admitting: Family Medicine

## 2018-01-21 ENCOUNTER — Ambulatory Visit: Payer: Medicaid Other | Admitting: Family Medicine

## 2018-01-21 ENCOUNTER — Ambulatory Visit: Payer: Medicaid Other | Admitting: Pharmacist

## 2018-01-21 NOTE — Telephone Encounter (Signed)
Pt would like Dr. Wonda Olds to give him a call. He just realized he had an appointment today 09/09 at 2:00 and will not be able to make it. I cancelled the appointment but he wants to discuss with her if he needs to reschedule.

## 2018-01-21 NOTE — Telephone Encounter (Signed)
Pt contacted and he is unable to make any apt time today, as he does not have transportation. Pt reminded to come in on 9/12.

## 2018-01-23 ENCOUNTER — Telehealth: Payer: Self-pay

## 2018-01-23 NOTE — Telephone Encounter (Signed)
Pt has called nurse line again requesting call back from PCP. Shawna Orleans, RN

## 2018-01-23 NOTE — Telephone Encounter (Signed)
Returned call to Dylan Jordan. He states he ran out of needles for his Victoza because one got stuck and did not administer the medication. He used the last needle last night. Reports sugars have been 80-90s, one high of 200. avg is 150. He is unable to get to pharmacy today to get more supplies. He will be unable to do tonight's dose. He will be here tomorrow. His appointment is at 1:30.  He states he has been very thirsty and still drinking water, sodas, and having problem with urinary incontinence.   [name], DO PGY-3, [facility] 01/23/2018 4:07 PM

## 2018-01-23 NOTE — Telephone Encounter (Signed)
Pt called nurse line requesting to speak to Dr. Raymondo Band about his Victoza. Very hard to understand patient, after several attempts. I believe pt needs a refill on this medication, due to "a mechanical issue." Again, very hard to understand. Pt is coming in tomorrow to see Koval. Pt would like for PCP to call him in regards to this issue.

## 2018-01-24 ENCOUNTER — Ambulatory Visit: Payer: Medicaid Other | Admitting: Pharmacist

## 2018-01-24 ENCOUNTER — Encounter: Payer: Self-pay | Admitting: Pharmacist

## 2018-01-24 VITALS — BP 112/72 | HR 86 | Ht 73.0 in | Wt 222.0 lb

## 2018-01-24 DIAGNOSIS — E1140 Type 2 diabetes mellitus with diabetic neuropathy, unspecified: Secondary | ICD-10-CM

## 2018-01-24 DIAGNOSIS — Z720 Tobacco use: Secondary | ICD-10-CM

## 2018-01-24 DIAGNOSIS — I10 Essential (primary) hypertension: Secondary | ICD-10-CM

## 2018-01-24 LAB — POCT GLYCOSYLATED HEMOGLOBIN (HGB A1C): HbA1c, POC (controlled diabetic range): 10.2 % — AB (ref 0.0–7.0)

## 2018-01-24 MED ORDER — LIRAGLUTIDE 18 MG/3ML ~~LOC~~ SOPN: 1.8000 mg | PEN_INJECTOR | Freq: Every day | SUBCUTANEOUS | 2 refills | Status: DC

## 2018-01-24 MED ORDER — GLIMEPIRIDE 2 MG PO TABS: 2.0000 mg | ORAL_TABLET | Freq: Every day | ORAL | 0 refills | Status: DC

## 2018-01-24 MED ORDER — PEN NEEDLES 32G X 5 MM MISC: 1.0000 "pen " | Freq: Every day | 2 refills | Status: DC

## 2018-01-24 MED ORDER — LIRAGLUTIDE 18 MG/3ML ~~LOC~~ SOPN: 1.8000 mg | PEN_INJECTOR | Freq: Every day | SUBCUTANEOUS | 0 refills | Status: DC

## 2018-01-24 MED ORDER — LOSARTAN POTASSIUM 25 MG PO TABS: 25.0000 mg | ORAL_TABLET | Freq: Every day | ORAL | 2 refills | Status: DC

## 2018-01-24 NOTE — Progress Notes (Signed)
S:     Chief Complaint  Patient presents with  . Medication Management    Diabetes    Patient arrives in good spirits, ambulating without assistance.  Presents for diabetes evaluation, education, and management at the request of Dr. Cherie Darkicco (referred on 08/29/2017).  Patient was last seen by Primary Care Provider on on 01/03/2018. He was last seen in Pharmacy clinic on 01/10/2018 - at that time, Victoza was titrated to 1.2 mg + 5 clicks, and d/c amlodipine while increasing losartan to 50 mg daily.   He denies GI upset with the increased dose of Victoza. He describes a pen getting stuck and being unable to inject, but was able to use a different pen. He is on his last pen of Victoza. He is extremely pleased with the improvement in SMBG results, though does note that he continues to drink soda. Since increasing losartan at last visit, he has noted increased symptomatic hypotension with dizziness/lightheadedness upon standing. He also notes that he continues to cut back on how many cigarettes he is smoking with the help of nicotine lozenges. He ranks the importance of tobacco cessation a 7 out of 10.  Insurance coverage/medication affordability: [place] Medicaid  Patient reports adherence with medications.  Current diabetes medications include: Metformin XR 750 mg (max tolerated dose); Victoza (liraglutide) 1.2 mg + 5 clicks, Jardiance (empagliflozin) 25 mg daily, and glimepiride 4 mg daily Current hypertension medications include: losartan 50 mg daily  Patient denies hypoglycemic events.  Patient denies nocturia.  Patient reports neuropathy. Patient denies visual changes. Patient reports self foot exams.    O:  Physical Exam  Constitutional: He appears well-developed and well-nourished.  Vitals reviewed.  Review of Systems  All other systems reviewed and are negative.  Lab Results  Component Value Date   HGBA1C 10.2 (A) 01/24/2018   Vitals:   01/24/18 1347  BP: 112/72  Pulse: 86    SpO2: 95%    Lipid Panel  No results found for: CHOL, TRIG, HDL, CHOLHDL, VLDL, LDLCALC, LDLDIRECT  7 Day Average: 137 mg/dL; 14 day 161153 mg/dL;  - Lowest reading is 70   Clinical ASCVD: No  ASCVD risk factors : age 61-75  A/P: Diabetes longstanding currently uncontrolled, but improved per SMBG results. Significant elevated A1C likely does not reflect the improvement in SMBG control that has been achieved in the past few weeks. Patient is able to verbalize appropriate hypoglycemia management plan. Patient is adherent with medication. - Maximize Victoza (liraglutide) by increasing to 1.8 mg daily. Provided sample and sent prescription to pharmacy - Continue max tolerated metformin XR 750 mg daily - Decrease glimepiride from 4 mg to 2 mg every morning to prevent afternoon hypoglycemia with increased Victoza dose - Continue Jardiance 25 mg daily -Extensively discussed pathophysiology of DM, recommended lifestyle interventions, dietary effects on glycemic control -Counseled on s/sx of and management of hypoglycemia   Hypertension longstanding currently well controlled, but with increased symptoms of hypotension s/p recent dose increase. BP goal <130/80 mmHg. Patient is adherent with medication. - Decreased losartan from 50 mg to 25 mg daily.  - Losartan may impact lithium levels. Reassess at upcoming visits.   Tobacco cessation: Patient has cut back on cigarette use, but continues to smoke every few days.  - Congratulated patient on current successes. Mutually set goal to smoke 1 cigarette or less daily moving forward   Written patient instructions provided.  Total time in face to face counseling 60 minutes.   Follow up PCP  visit in 2-3 weeks.   Patient seen with Catie Feliz Beam, PharmD

## 2018-01-24 NOTE — Assessment & Plan Note (Signed)
Hypertension longstanding currently well controlled, but with increased symptoms of hypotension s/p recent dose increase. BP goal <130/80 mmHg. Patient is adherent with medication. - Decreased losartan from 50 mg to 25 mg daily.  - Losartan may impact lithium levels. Reassess at upcoming visits.

## 2018-01-24 NOTE — Assessment & Plan Note (Signed)
Tobacco cessation: Patient has cut back on cigarette use, but continues to smoke every few days.  - Congratulated patient on current successes. Mutually set goal to smoke 1 cigarette or less daily moving forward

## 2018-01-24 NOTE — Patient Instructions (Addendum)
It was great to see you today!  We are going to make some changes:  1) Increase Victoza to 1.8 mg daily. We sent this prescription to your pharmacy.  2) Decrease glimepiride (Amaryl) to 2 mg. You can take 1/2 of the tablets you currently have, but we have also sent an updated prescription to your pharmacy.  2) Decrease losartan to 25 mg daily. You can take 1/2 of the tablets you currently have, but we have also sent an updated prescription to your pharmacy.    Follow up with Dr. Cherie Darkicco in 2-3 weeks.

## 2018-01-24 NOTE — Assessment & Plan Note (Signed)
Diabetes longstanding currently uncontrolled, but improved per SMBG results. Significant elevated A1C likely does not reflect the improvement in SMBG control that has been achieved in the past few weeks. Patient is able to verbalize appropriate hypoglycemia management plan. Patient is adherent with medication. - Maximize Victoza (liraglutide) by increasing to 1.8 mg daily. Provided sample and sent prescription to pharmacy - Continue max tolerated metformin XR 750 mg daily - Decrease glimepiride from 4 mg to 2 mg every morning to prevent afternoon hypoglycemia with increased Victoza dose - Continue Jardiance 25 mg daily -Extensively discussed pathophysiology of DM, recommended lifestyle interventions, dietary effects on glycemic control -Counseled on s/sx of and management of hypoglycemia

## 2018-01-29 NOTE — Progress Notes (Signed)
Patient ID: Dylan Jordan, male   DOB: 08/08/1955, 62 y.o.   MRN: [id number] I reviewed and agree with Dr. Macky LowerKoval's documentation and management.

## 2018-02-04 ENCOUNTER — Telehealth: Payer: Self-pay | Admitting: *Deleted

## 2018-02-04 NOTE — Telephone Encounter (Signed)
Pt states that he does not understand the "bladder exercises" that Dr. Wonda Oldsiccio told him to do.  He request a call from her.  Pt also states that he received a $33 bill from us.  Advised he would need to call billing and they could help him.  He states that he has no money.  Informed that would still need to be done by billing department. [place], Maryjo RochesterJessica Dawn, CMA

## 2018-02-04 NOTE — Telephone Encounter (Signed)
Please have patient come in for appointment to discuss issues.

## 2018-02-05 NOTE — Telephone Encounter (Signed)
Pt contacted and scheduled for 10/4.

## 2018-02-06 ENCOUNTER — Telehealth: Payer: Self-pay

## 2018-02-06 NOTE — Telephone Encounter (Signed)
Patient left message wanting to speak with PCP. He does have an appt on 10/4. Could not understand but seems to be about some testing that is coming to his house but he will be out of town.   [phone]  Ples Specter, RN [facility] RN)

## 2018-02-07 NOTE — Telephone Encounter (Signed)
Patient called again wanting to speak with PCP.  Ples Specter, RN [facility] RN)

## 2018-02-07 NOTE — Telephone Encounter (Signed)
Returned call. Patient states he was supposed to have a package delivered that was ordered by me or Dr. Raymondo Band for "bladder supplies" and I'm not sure what he is talking about. I do not see anything Dr. Raymondo Band ordered. He has appointment with Urology 10/18. I'm not sure if that office said they would send him new patient paperwork or something. He is distressed because he is out of town soon and does not want to miss this package. I see he has an appointment 03/01/18 with urology. I do not see any orders for a package. Told him there's nothing I can do about his mail if he is out of town.   [name], DO PGY-3, [facility] 02/07/2018 11:53 AM

## 2018-02-14 ENCOUNTER — Emergency Department (HOSPITAL_COMMUNITY)
Admission: EM | Admit: 2018-02-14 | Discharge: 2018-02-14 | Disposition: A | Discharge status: Home / Self Care | Payer: Medicaid Other | Attending: Emergency Medicine | Admitting: Emergency Medicine

## 2018-02-14 ENCOUNTER — Other Ambulatory Visit: Payer: Self-pay

## 2018-02-14 ENCOUNTER — Encounter (HOSPITAL_COMMUNITY): Payer: Self-pay

## 2018-02-14 DIAGNOSIS — J45909 Unspecified asthma, uncomplicated: Secondary | ICD-10-CM | POA: Diagnosis not present

## 2018-02-14 DIAGNOSIS — F209 Schizophrenia, unspecified: Secondary | ICD-10-CM | POA: Insufficient documentation

## 2018-02-14 DIAGNOSIS — F17210 Nicotine dependence, cigarettes, uncomplicated: Secondary | ICD-10-CM | POA: Diagnosis not present

## 2018-02-14 DIAGNOSIS — I10 Essential (primary) hypertension: Secondary | ICD-10-CM | POA: Insufficient documentation

## 2018-02-14 DIAGNOSIS — Z79899 Other long term (current) drug therapy: Secondary | ICD-10-CM | POA: Diagnosis not present

## 2018-02-14 DIAGNOSIS — E119 Type 2 diabetes mellitus without complications: Secondary | ICD-10-CM | POA: Insufficient documentation

## 2018-02-14 LAB — COMPREHENSIVE METABOLIC PANEL
ALBUMIN: 3.4 g/dL — AB (ref 3.5–5.0)
ALK PHOS: 48 U/L (ref 38–126)
ALT: 12 U/L (ref 0–44)
ANION GAP: 8 (ref 5–15)
AST: 18 U/L (ref 15–41)
BILIRUBIN TOTAL: 0.2 mg/dL — AB (ref 0.3–1.2)
BUN: 9 mg/dL (ref 8–23)
CALCIUM: 9.5 mg/dL (ref 8.9–10.3)
CO2: 22 mmol/L (ref 22–32)
Chloride: 109 mmol/L (ref 98–111)
Creatinine, Ser: 1.16 mg/dL (ref 0.61–1.24)
GLUCOSE: 156 mg/dL — AB (ref 70–99)
POTASSIUM: 3.8 mmol/L (ref 3.5–5.1)
Sodium: 139 mmol/L (ref 135–145)
TOTAL PROTEIN: 6.8 g/dL (ref 6.5–8.1)

## 2018-02-14 LAB — RAPID URINE DRUG SCREEN, HOSP PERFORMED
AMPHETAMINES: NOT DETECTED
Barbiturates: NOT DETECTED
Benzodiazepines: NOT DETECTED
Cocaine: NOT DETECTED
Opiates: NOT DETECTED
Tetrahydrocannabinol: NOT DETECTED

## 2018-02-14 LAB — CBC
HCT: 41.3 % (ref 39.0–52.0)
Hemoglobin: 12.5 g/dL — ABNORMAL LOW (ref 13.0–17.0)
MCH: 29.1 pg (ref 26.0–34.0)
MCHC: 30.3 g/dL (ref 30.0–36.0)
MCV: 96 fL (ref 78.0–100.0)
PLATELETS: 205 10*3/uL (ref 150–400)
RBC: 4.3 MIL/uL (ref 4.22–5.81)
RDW: 14.3 % (ref 11.5–15.5)
WBC: 5 10*3/uL (ref 4.0–10.5)

## 2018-02-14 LAB — ETHANOL: Alcohol, Ethyl (B): <10 mg/dL (ref <10)

## 2018-02-14 MED ORDER — ACETAMINOPHEN 325 MG PO TABS: 650.0000 mg | ORAL_TABLET | ORAL | Status: DC | PRN

## 2018-02-14 NOTE — ED Triage Notes (Signed)
Pt presents for IVC and psych eval. Pt reports some conflict with neighbors and sister took out IVC because patient has history of being "difficult to get to the hospital." [employer] reports no aggression or difficulty from patient today. Pt denies SI/HI, denies auditory or visual hallucinations.

## 2018-02-14 NOTE — ED Provider Notes (Signed)
[facility] EMERGENCY DEPARTMENT Provider Note   CSN: [id number] Arrival date & time: 02/14/18  1249     History   Chief Complaint Chief Complaint  Patient presents with  . Psychiatric Evaluation    HPI Ronson Hagins is a 62 y.o. male.  HPI Pt brought to ER by police with IVC paperwork from his sister stating increasing hallucinations and agitation. Hx of schizophrenia. Noncompliant with his medications. Pt without complaints at this time. Reports he is taking his meds    Past Medical History:  Diagnosis Date  . Anemia   . Asthma   . Diabetes mellitus without complication (HCC)   . Gout   . Hyperlipidemia   . Hypertension     Patient Active Problem List   Diagnosis Date Noted  . Hyperlipidemia 12/18/2017  . 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 Intermittent chest pain 11/25/2017  . Allergic rhinitis with a nonallergic component 07/30/2017  . Food allergy 07/30/2017  . C. difficile diarrhea 05/22/2017  . Chronic gout involving toe of left foot without tophus 05/18/2017  . Hemorrhoids 05/04/2017  . Haloperidol adverse reaction 04/16/2017  . Chronic low back pain 01/03/2017  . Chronic cough 06/30/2016  . Drug-induced mood disorder (HCC) 06/14/2016  . Type 2 diabetes mellitus with diabetic neuropathy (HCC) 05/26/2016  . Iron deficiency anemia 05/26/2016  . Chronic kidney disease 05/26/2016  . HTN (hypertension) 05/26/2016  . Tobacco abuse 05/26/2016  . History of substance abuse (HCC) 05/26/2016  . History of alcohol abuse 05/26/2016  . Mild persistent asthma 05/26/2016    Past Surgical History:  Procedure Laterality Date  . REPLACEMENT TOTAL KNEE Left   . TOE SURGERY          Home Medications    Prior to Admission medications   Medication Sig Start Date End Date Taking? Authorizing Provider  ACCU-CHEK AVIVA PLUS test strip use as directed to check blood sugar THREE TIMES DAILY 07/02/17   Tillman Sers, DO    ACCU-CHEK SOFTCLIX LANCETS lancets use as directed *may use THREE TIMES DAILY to check blood sugar* 06/28/17   Tillman Sers, DO  acetaminophen (TYLENOL) 650 MG CR tablet Take 650 mg by mouth 2 (two) times daily as needed for pain.     [provider]  albuterol (PROVENTIL HFA;VENTOLIN HFA) 108 (90 Base) MCG/ACT inhaler Inhale 2 puffs into the lungs as needed for wheezing or shortness of breath.    [provider]  allopurinol (ZYLOPRIM) 100 MG tablet Take 2 tablets (200 mg total) by mouth daily. 01/10/18   Moses Manners, MD  atorvastatin (LIPITOR) 20 MG tablet take 1 TABLET BY MOUTH EVERY DAY 01/22/17   [name] C, DO  benztropine (COGENTIN) 2 MG tablet Take 2 mg by mouth 2 (two) times daily.    [provider]  Colchicine (MITIGARE) 0.6 MG CAPS Take 0.6 mg by mouth daily. Take 2 tablets once, then take 1 tablet one hour later. Patient not taking: Reported on 01/24/2018 12/13/17   Lovena Neighbours, MD  divalproex (DEPAKOTE) 500 MG DR tablet Take 500-2,000 mg by mouth See admin instructions. Take 500mg  every morning and 2000 tabs every evening    [provider]  empagliflozin (JARDIANCE) 25 MG TABS tablet Take 25 mg by mouth daily. 10/18/17   Moses Manners, MD  ferrous sulfate (FERROUSUL) 325 (65 FE) MG tablet Take 1 tablet (325 mg total) by mouth daily with breakfast. 01/10/17   Tillman Sers,  DO  fluticasone (FLOVENT HFA) 110 MCG/ACT inhaler Inhale 2 puffs into the lungs 2 (two) times daily. Patient not taking: Reported on 01/24/2018 07/30/17   Bobbitt, Heywood Iles, MD  gabapentin (NEURONTIN) 300 MG capsule Take 1 capsule (300 mg total) by mouth 3 (three) times daily as needed. 12/19/17   Tillman Sers, DO  glimepiride (AMARYL) 2 MG tablet Take 1 tablet (2 mg total) by mouth daily with breakfast. 01/24/18   Moses Manners, MD  haloperidol (HALDOL) 5 MG tablet Take 5 mg by mouth at bedtime.    [provider]  haloperidol decanoate  (HALDOL DECANOATE) 100 MG/ML injection Inject 400 mg into the muscle every 28 (twenty-eight) days.    [provider]  Insulin Pen Needle (PEN NEEDLES) 32G X 5 MM MISC 1 pen by Does not apply route daily. 01/24/18   Moses Manners, MD  liraglutide (VICTOZA) 18 MG/3ML SOPN Inject 0.3 mLs (1.8 mg total) into the skin daily. 01/24/18   Moses Manners, MD  lithium carbonate 300 MG capsule Take 300 mg by mouth 2 (two) times daily with a meal.    [provider]  losartan (COZAAR) 25 MG tablet Take 1 tablet (25 mg total) by mouth daily. 01/24/18   Moses Manners, MD  metFORMIN (GLUCOPHAGE-XR) 750 MG 24 hr tablet Take 1 tablet (750 mg total) by mouth daily with breakfast. 01/03/18   Hensel, Santiago Bumpers, MD  Multiple Vitamin (DAILY-VITE) TABS TAKE 1 TABLET BY MOUTH DAILY. 10/03/16   Garth Bigness, MD  nicotine polacrilex (EQ NICOTINE) 4 MG lozenge Take 1 lozenge (4 mg total) by mouth as needed for smoking cessation. 10/18/17   Moses Manners, MD  omeprazole (PRILOSEC) 20 MG capsule Take 2 capsules (40 mg total) by mouth every evening. 11/12/17   Tillman Sers, DO    Family History Family History  Adopted: Yes  Problem Relation Age of Onset  . Allergic rhinitis Neg Hx   . Angioedema Neg Hx   . Asthma Neg Hx   . Atopy Neg Hx   . Eczema Neg Hx   . Immunodeficiency Neg Hx   . Urticaria Neg Hx     Social History Social History   Tobacco Use  . Smoking status: Current Every Day Smoker    Packs/day: 0.30    Years: 45.00    Pack years: 13.50    Types: Cigarettes    Start date: 05/16/1971  . Smokeless tobacco: Never Used  Substance Use Topics  . Alcohol use: No  . Drug use: No     Allergies   Chantix [varenicline tartrate]; Ativan [lorazepam]; and Penicillins   Review of Systems Review of Systems  All other systems reviewed and are negative.    Physical Exam Updated Vital Signs BP (!) 134/91 (BP Location: Right Arm)   Pulse 95   Temp 98.6 F (37 C)  (Oral)   Resp 16   SpO2 97%   Physical Exam  Constitutional: He is oriented to person, place, and time. He appears well-developed and well-nourished.  HENT:  Head: Normocephalic.  Eyes: EOM are normal.  Neck: Normal range of motion.  Pulmonary/Chest: Effort normal.  Abdominal: He exhibits no distension.  Musculoskeletal: Normal range of motion.  Neurological: He is alert and oriented to person, place, and time.  Psychiatric: He has a normal mood and affect.  Calm and cooperative.   Nursing note and vitals reviewed.    ED Treatments / Results  Labs (all labs  ordered are listed, but only abnormal results are displayed) Labs Reviewed  CBC - Abnormal; Notable for the following components:      Result Value   Hemoglobin 12.5 (*)    All other components within normal limits  COMPREHENSIVE METABOLIC PANEL  ETHANOL  RAPID URINE DRUG SCREEN, HOSP PERFORMED    EKG None  Radiology No results found.  Procedures Procedures (including critical care time)  Medications Ordered in ED Medications  acetaminophen (TYLENOL) tablet 650 mg (has no administration in time range)     Initial Impression / Assessment and Plan / ED Course  I have reviewed the triage vital signs and the nursing notes.  Pertinent labs & imaging results that were available during my care of the patient were reviewed by me and considered in my medical decision making (see chart for details).     Pt seems stable at this time. Does not appear agitated. Pt reports compliance with his medications. He does not appear to be a threat to himself or others at this time. TTS to evaluate given IVC paperwork  Final Clinical Impressions(s) / ED Diagnoses   Final diagnoses:  None    ED Discharge Orders    None       Azalia Bilis, MD 02/14/18 1432

## 2018-02-14 NOTE — ED Notes (Signed)
TTS at the bedside. 

## 2018-02-14 NOTE — ED Notes (Signed)
Pt's medications brought back from triage at 1400, inventoried but not sent down to pharmacy due to plan for discharge, okay per charge.

## 2018-02-14 NOTE — BH Assessment (Addendum)
TTS spoke with Yolanda Manges at [facility].  The pt was scheduled to be at [facility] currently to get his Haldol IM injection of 200 mg in each hip.  She stated if he can't get the injection today at the hospital he can come to [facility] as a walk in and get the injection at that time.  Yolanda Manges can be reached at [phone].  Information was relayed to RN and [employer].

## 2018-02-14 NOTE — Progress Notes (Signed)
Patient is seen by me via tele-psych and I have consulted with Dr. Lucianne Muss.  Patient denies any suicidal or homicidal ideations and denies any hallucinations as well.  Patient states that he will be safe if he is discharged.  Patient states that he felt that his sister probably had him committed so that he would be evaluated because she used to do that when he lived in [place].  Patient states that his sister lives in [place] and he has not seen her.  Patient does give me permission to contact his sister.  Patient's sister was contacted per the information on the face sheet and she admits that in the past the patient's hallucinations were the reason he had acted bizarre.  She stated that she was not there and she has not seen him and was unsure about what had exactly happened last night.  It was reported that the patient had knocked on a single individuals door for approximately 30 minutes which caused some arguments.  Patient stated that he was doing it because he kept wanting to use the phone.  Patient did not meet criteria for IVC per the patient or the sister story.  Patient does not meet inpatient criteria and is psychiatrically cleared.  I have contacted Dr. Patria Mane and notified him of the recommendation.

## 2018-02-14 NOTE — ED Provider Notes (Signed)
Patient placed in Quick Look pathway, seen and evaluated   Chief Complaint: IVC  HPI: Dylan Jordan is a 62 y.o. male who presents to the ED with his sister for psych evaluation. . Pt reports some conflict with neighbors and sister took out IVC because patient has history of being "difficult to get to the hospital." [employer] reports no aggression or difficulty from patient today. Pt denies SI/HI, denies auditory or visual hallucinations. patient concerned that he may not get his regular medication while here and states he has been taking his medications and does not want to miss a dose. Patient reports mild headaches off and on.   ROS: Psych: aggitated  Neuro: headache  Physical Exam:  BP (!) 134/91 (BP Location: Right Arm)   Pulse 95   Temp 98.6 F (37 C) (Oral)   Resp 16   SpO2 97%    Gen: No distress  Neuro: Awake and Alert  Skin: Warm and dry  Psych: cooperative   Initiation of care has begun. The patient has been counseled on the process, plan, and necessity for staying for the completion/evaluation, and the remainder of the medical screening examination    Janne Napoleon, NP 02/14/18 1332    Rolan Bucco, MD 02/14/18 1402

## 2018-02-14 NOTE — Discharge Instructions (Addendum)
Please follow up at [facility] tomorrow for your haldol injection

## 2018-02-14 NOTE — ED Notes (Signed)
Called LAB for UA. Lab stated they did not have UA. I will ask pt for another UA sample.

## 2018-02-14 NOTE — ED Provider Notes (Signed)
IVC paperwork reversed. No long acting haldol available at this facility. Discharge from the ER to follow up at [facility] in the AM for haldol injection   Azalia Bilis, MD 02/14/18 1657

## 2018-02-14 NOTE — ED Notes (Signed)
Regular Diet was ordered for Lunch. 

## 2018-02-14 NOTE — ED Notes (Signed)
rescend paperwork faxed to clerk of court

## 2018-02-14 NOTE — BH Assessment (Addendum)
Tele Assessment Note   Patient Name: Dylan Jordan MRN: [id number] Referring Physician: Patria Mane  Location of Patient: [facility] ED  Location of Provider: Behavioral Health TTS Department  Unique [name] is an 62 y.o. male. The pt came in after being IVC'd by his sister.  The pt had some conflict with his neighbors last night where he stated he needed some help and knocked on some of his neighbors doors.  The neighbors became upset because of the time(9-10pm).  The pt denies any SI, HI or current psychosis.  He goes to [facility] and is scheduled to get his Haldol injection today.  The pt has a history of being hospitalized and was last hospitalized 5 years ago at a hospital in [place].  The pt lives alone and is worried he may be put out of his home due to the incident with his neighbors.  The pt denies present SI, self harm, HI, legal issues and history of abuse.  The pt stated he used to have auditory hallucinations, but denies having those when he is taking the medication.  The pt stated he is sleeping and eating well.  The pt has a history of abusing cocaine and alcohol.  He last used cocaine 27 years ago and alcohol 7 years ago.   Pt is dressed in scrubs. He is alert and oriented x4. Pt speaks in a clear tone, at moderate volume and normal pace. Eye contact is good. Pt's mood is pleasant. Thought process is coherent and relevant. There is no indication Pt is currently responding to internal stimuli or experiencing delusional thought content.?Pt was cooperative throughout assessment.    Diagnosis:F25.0 Schizoaffective disorder, Bipolar type  Past Medical History:  Past Medical History:  Diagnosis Date  . Anemia   . Asthma   . Diabetes mellitus without complication (HCC)   . Gout   . Hyperlipidemia   . Hypertension     Past Surgical History:  Procedure Laterality Date  . REPLACEMENT TOTAL KNEE Left   . TOE SURGERY      Family History:  Family History  Adopted: Yes  Problem Relation Age  of Onset  . Allergic rhinitis Neg Hx   . Angioedema Neg Hx   . Asthma Neg Hx   . Atopy Neg Hx   . Eczema Neg Hx   . Immunodeficiency Neg Hx   . Urticaria Neg Hx     Social History:  reports that he has been smoking cigarettes. He started smoking about 46 years ago. He has a 13.50 pack-year smoking history. He has never used smokeless tobacco. He reports that he does not drink alcohol or use drugs.  Additional Social History:  Alcohol / Drug Use Pain Medications: See MAR Prescriptions: See MAR Over the Counter: See MAR History of alcohol / drug use?: Yes Longest period of sobriety (when/how long): 7 years Substance #1 Name of Substance 1: cocaine 1 - Last Use / Amount: 27 years ago Substance #2 Name of Substance 2: alcohol 2 - Last Use / Amount: 7 years ago  CIWA: CIWA-Ar BP: (!) 134/91 Pulse Rate: 95 COWS:    Allergies:  Allergies  Allergen Reactions  . Chantix [Varenicline Tartrate] Other (See Comments)    Nightmares  . Ativan [Lorazepam] Other (See Comments)    Agitation (and "5 others like it") Will need to call [facility] [place] (phone) [phone] and [phone] to fax a release-   . Penicillins Diarrhea    Has patient had a PCN reaction  causing immediate rash, facial/tongue/throat swelling, SOB or lightheadedness with hypotension: No Has patient had a PCN reaction causing severe rash involving mucus membranes or skin necrosis: No Has patient had a PCN reaction that required hospitalization: No Has patient had a PCN reaction occurring within the last 10 years: No If all of the above answers are "NO", then may proceed with Cephalosporin use.     Home Medications:  (Not in a hospital admission)  OB/GYN Status:  No LMP for male patient.  General Assessment Data Location of Assessment: [facility] ED TTS Assessment: In system Is this a Tele or Face-to-Face Assessment?: Face-to-Face Is this an Initial Assessment or a Re-assessment for this encounter?:  Initial Assessment Patient Accompanied by:: N/A Language Other than English: No Living Arrangements: Other (Comment)(home) What gender do you identify as?: Male Marital status: Single Maiden name: NA Pregnancy Status: Other (Comment)(male) Living Arrangements: Alone Can pt return to current living arrangement?: Yes Admission Status: Involuntary Petitioner: Family member Is patient capable of signing voluntary admission?: No Referral Source: Self/Family/Friend Insurance type: Medicaid     Crisis Care Plan Living Arrangements: Alone Legal Guardian: Other:(self) [facility]rapist: [facility]  Education Status Is patient currently in school?: No Is the patient employed, unemployed or receiving disability?: Unemployed  Risk to self with the past 6 months Suicidal Ideation: No Has patient been a risk to self within the past 6 months prior to admission? : No Suicidal Intent: No Has patient had any suicidal intent within the past 6 months prior to admission? : No Is patient at risk for suicide?: No Suicidal Plan?: No Has patient had any suicidal plan within the past 6 months prior to admission? : No Access to Means: No What has been your use of drugs/alcohol within the last 12 months?: past use of cocaine and alcohol Previous Attempts/Gestures: No How many times?: 0 Other Self Harm Risks: none Triggers for Past Attempts: None known Intentional Self Injurious Behavior: None Family Suicide History: Unknown Recent stressful life event(s): Conflict (Comment)(arguments with neighbors) Persecutory voices/beliefs?: No Depression: No Substance abuse history and/or treatment for substance abuse?: Yes Suicide prevention information given to non-admitted patients: Not applicable  Risk to Others within the past 6 months Homicidal Ideation: No Does patient have any lifetime risk of violence toward others beyond the six months prior to admission? : No Thoughts of Harm  to Others: No Current Homicidal Intent: No Current Homicidal Plan: No Access to Homicidal Means: No Identified Victim: none History of harm to others?: No Assessment of Violence: None Noted Violent Behavior Description: none Does patient have access to weapons?: No Criminal Charges Pending?: No Does patient have a court date: No Is patient on probation?: No  Psychosis Hallucinations: None noted Delusions: None noted  Mental Status Report Appearance/Hygiene: Unremarkable, In scrubs Eye Contact: Good Motor Activity: Freedom of movement, Unremarkable Speech: Logical/coherent Level of Consciousness: Alert Mood: Pleasant Affect: Appropriate to circumstance Anxiety Level: None Thought Processes: Coherent, Relevant Judgement: Partial Orientation: Person, Place, Time, Situation, Appropriate for developmental age Obsessive Compulsive Thoughts/Behaviors: None  Cognitive Functioning Concentration: Normal Memory: Recent Intact, Remote Intact Is patient IDD: No Insight: Fair Impulse Control: Fair Appetite: Good Have you had any weight changes? : No Change Sleep: No Change Total Hours of Sleep: 8 Vegetative Symptoms: None  ADLScreening [facility] Assessment Services) Patient's cognitive ability adequate to safely complete daily activities?: Yes Patient able to express need for assistance with ADLs?: Yes Independently performs ADLs?: Yes (appropriate for developmental age)  Prior Inpatient Therapy Prior Inpatient Therapy: Yes Prior Therapy Dates: 2014 and mulptiple times prior Prior Therapy Facilty/Provider(s): Facilities in [place] Reason for Treatment: manic behavior  Prior Outpatient Therapy Prior Outpatient Therapy: Yes Prior Therapy Dates: current [facility] Reason for Treatment: scizoaffective disorder Does patient have an ACCT team?: No Does patient have Intensive In-House Services?  : No Does patient have [name] services? : Yes Does patient have  P4CC services?: No  ADL Screening (condition at time of admission) Patient's cognitive ability adequate to safely complete daily activities?: Yes Patient able to express need for assistance with ADLs?: Yes Independently performs ADLs?: Yes (appropriate for developmental age)       Abuse/Neglect Assessment (Assessment to be complete while patient is alone) Abuse/Neglect Assessment Can Be Completed: Yes Physical Abuse: Denies Verbal Abuse: Denies Exploitation of patient/patient's resources: Denies Self-Neglect: Denies Values / Beliefs Cultural Requests During Hospitalization: None Spiritual Requests During Hospitalization: None Consults Spiritual Care Consult Needed: No Social Work Consult Needed: No Merchant navy officer (For Healthcare) Does Patient Have a Medical Advance Directive?: No Would patient like information on creating a medical advance directive?: No - Patient declined          Disposition:  Disposition Initial Assessment Completed for this Encounter: Yes   NP Feliz Beam Money recommends the pt be discharged and to follow up with his OPT.  RN and MD Patria Mane were made aware of the recommendations.  This service was provided via telemedicine using a 2-way, interactive audio and video technology.  Names of all persons participating in this telemedicine service and their role in this encounter. Name: Rochele Raring Role: Pt  Name: Riley Churches Role: TTS  Name:  Role:   Name:  Role:     Ottis Stain 02/14/2018 3:42 PM

## 2018-02-15 ENCOUNTER — Ambulatory Visit: Payer: Medicaid Other | Admitting: Family Medicine

## 2018-02-20 ENCOUNTER — Telehealth: Payer: Self-pay

## 2018-02-20 NOTE — Telephone Encounter (Signed)
Patient has questions regarding his diabetic meds and side effects. Wishes to speak with Dr Raymondo Band.  Call back is [phone]  Ples Specter, RN [facility] RN)

## 2018-02-21 NOTE — Telephone Encounter (Signed)
Phone call back to patient following 2 calls from him RE his blood sugar medication(s) - Question.  Reports his blood sugar have been doing well with majority of readings 100-200 (few readings > 200).   Clarified doses of losartan = 25 mg once daily AND  Clarified dose of glimepiride = 2 mg once daily  Plans for f/u - November 4th at [facility].

## 2018-02-21 NOTE — Telephone Encounter (Signed)
Pt calling back to speak with Dr. Raymondo Band.  He will be home around 1pm Taneika Choi, Maryjo [place], [place]

## 2018-02-22 ENCOUNTER — Ambulatory Visit: Payer: Medicaid Other | Admitting: Pharmacist

## 2018-02-22 ENCOUNTER — Ambulatory Visit: Payer: Medicaid Other | Admitting: Family Medicine

## 2018-02-26 ENCOUNTER — Other Ambulatory Visit: Payer: Self-pay

## 2018-02-26 DIAGNOSIS — E1140 Type 2 diabetes mellitus with diabetic neuropathy, unspecified: Secondary | ICD-10-CM

## 2018-02-27 MED ORDER — GLIMEPIRIDE 2 MG PO TABS: 2.0000 mg | ORAL_TABLET | Freq: Every day | ORAL | 2 refills | Status: DC

## 2018-03-05 ENCOUNTER — Ambulatory Visit: Payer: Medicaid Other | Admitting: Podiatry

## 2018-03-06 ENCOUNTER — Other Ambulatory Visit: Payer: Self-pay

## 2018-03-06 ENCOUNTER — Other Ambulatory Visit: Payer: Self-pay | Admitting: *Deleted

## 2018-03-06 MED ORDER — FERROUS SULFATE 325 (65 FE) MG PO TABS: 325.0000 mg | ORAL_TABLET | Freq: Every day | ORAL | 3 refills | Status: DC

## 2018-03-07 ENCOUNTER — Other Ambulatory Visit: Payer: Self-pay

## 2018-03-07 ENCOUNTER — Telehealth: Payer: Self-pay

## 2018-03-07 MED ORDER — GABAPENTIN 300 MG PO CAPS: 300.0000 mg | ORAL_CAPSULE | Freq: Three times a day (TID) | ORAL | 1 refills | Status: DC | PRN

## 2018-03-07 MED ORDER — ATORVASTATIN CALCIUM 20 MG PO TABS: 20.0000 mg | ORAL_TABLET | Freq: Every day | ORAL | 3 refills | Status: DC

## 2018-03-07 NOTE — Telephone Encounter (Signed)
Patient left message asking if PCP knows anything about a package that was to be delivered to his home. He was not home when post office tried to deliver it and post office needs a tracking number to locate it?  Call back is [phone]  Dylan Specter, RN [facility] RN)

## 2018-03-07 NOTE — Telephone Encounter (Signed)
Spoke with patient and informed him that we didn't have anything mailed to him.  He figured out that it possibly came from urology and will contact their office to inquire about it.  Jazmin Hartsell,CMA

## 2018-03-11 ENCOUNTER — Telehealth: Payer: Self-pay | Admitting: *Deleted

## 2018-03-11 NOTE — Telephone Encounter (Signed)
Could consider RN visit to check BP vs refer to urgent care if he returns call.

## 2018-03-11 NOTE — Telephone Encounter (Signed)
PT lm on nurse line. He states that he gets dizzy when bending over and standing back up.  He has appt on 03/18/18.  Attempted to call back.  LMOVM for pt to return call. Fle[place], Maryjo [place], CMA

## 2018-03-13 ENCOUNTER — Ambulatory Visit (INDEPENDENT_AMBULATORY_CARE_PROVIDER_SITE_OTHER): Payer: Medicaid Other | Admitting: Podiatry

## 2018-03-13 ENCOUNTER — Encounter: Payer: Self-pay | Admitting: Podiatry

## 2018-03-13 DIAGNOSIS — B351 Tinea unguium: Secondary | ICD-10-CM

## 2018-03-13 DIAGNOSIS — E1142 Type 2 diabetes mellitus with diabetic polyneuropathy: Secondary | ICD-10-CM

## 2018-03-13 DIAGNOSIS — M79609 Pain in unspecified limb: Secondary | ICD-10-CM

## 2018-03-13 NOTE — Progress Notes (Signed)
Patient came to the office for treatment but decided against treatment since his treatment would not be covered.   Helane Gunther DPM

## 2018-03-15 ENCOUNTER — Other Ambulatory Visit: Payer: Self-pay

## 2018-03-15 MED ORDER — OMEPRAZOLE 20 MG PO CPDR: 40.0000 mg | DELAYED_RELEASE_CAPSULE | Freq: Every evening | ORAL | 2 refills | Status: DC

## 2018-03-18 ENCOUNTER — Ambulatory Visit (INDEPENDENT_AMBULATORY_CARE_PROVIDER_SITE_OTHER): Payer: Medicaid Other | Admitting: Pharmacist

## 2018-03-18 ENCOUNTER — Encounter: Payer: Self-pay | Admitting: Pharmacist

## 2018-03-18 ENCOUNTER — Ambulatory Visit: Payer: Medicaid Other | Admitting: Family Medicine

## 2018-03-18 VITALS — BP 110/80 | HR 103 | Temp 98.3°F | Ht 73.0 in | Wt 226.0 lb

## 2018-03-18 DIAGNOSIS — Z23 Encounter for immunization: Secondary | ICD-10-CM

## 2018-03-18 DIAGNOSIS — Z720 Tobacco use: Secondary | ICD-10-CM

## 2018-03-18 DIAGNOSIS — E1140 Type 2 diabetes mellitus with diabetic neuropathy, unspecified: Secondary | ICD-10-CM

## 2018-03-18 DIAGNOSIS — N3946 Mixed incontinence: Secondary | ICD-10-CM | POA: Diagnosis present

## 2018-03-18 DIAGNOSIS — N183 Chronic kidney disease, stage 3 unspecified: Secondary | ICD-10-CM

## 2018-03-18 DIAGNOSIS — E7800 Pure hypercholesterolemia, unspecified: Secondary | ICD-10-CM | POA: Diagnosis not present

## 2018-03-18 DIAGNOSIS — Z1159 Encounter for screening for other viral diseases: Secondary | ICD-10-CM | POA: Diagnosis not present

## 2018-03-18 NOTE — Progress Notes (Signed)
    Subjective:    Patient ID: Dylan Jordan, male    DOB: 1955/12/02, 62 y.o.   MRN: [id number]   CC: follow up DM, incontinence  DM- saw Dr. Raymondo Band today. Avg CBGs 155. He ran out of jardiance last week and has not taken it since then. Taking amaryl, metformin, victoza. No increased hunger. Reports increased thirst and increased urination.  Incontinence- still peeing a lot. Stops drinking at 9pm to avoid accidents. No burning or pain with urination.  Received haldol shot w/ [facility] last week. He has severely slurred speech and shuffling gait.   Smoking status reviewed- current every day smoker  Review of Systems- see HPI   Objective:  BP 110/80   Pulse (!) 103   Temp 98.3 F (36.8 C) (Oral)   Ht 6\' 1"  (1.854 m)   Wt 226 lb (102.5 kg)   SpO2 91%   BMI 29.82 kg/m  Vitals and nursing note reviewed  General: well nourished, in no acute distress HEENT: normocephalic, MMM Cardiac: RRR, clear S1 and S2, no murmurs, rubs, or gallops Respiratory: clear to auscultation bilaterally, no increased work of breathing Extremities: no edema or cyanosis. Neuro: oriented to person, place, time. Slurred speech. Shuffling gait.   Assessment & Plan:    1. Mixed stress and urge urinary incontinence I do believe this is from the jardiance as his last 2 urinalyses had >500 glucose in urine and its a known side effect, will stop this medication and follow up 1 month. Patient reported to Dr. Raymondo Band he was out of jardiance for 1 week but told me he was still taking it and needed a refill. I spoke with his pharmacy on the phone and told them to cancel refill as we are stopping this medication.   2. Type 2 diabetes mellitus with diabetic neuropathy, without long-term current use of insulin (HCC) Discontinue jardiance due to urinary incontinence. Continue other medications at current doses. Follow up 1 month.  3. Screening for viral disease Will screen for Hep C - Hepatitis C antibody  4. Pure  hypercholesterolemia Due for lipid panel - Lipid panel  5. Stage 3 chronic kidney disease (HCC) Check kidney function today - Basic metabolic panel  6. Need for immunization against influenza - Flu Vaccine QUAD 36+ mos IM    Return in about 4 weeks (around 04/15/2018) for DM.   [name], DO Family Medicine Resident PGY-3

## 2018-03-18 NOTE — Assessment & Plan Note (Signed)
Tobacco use disorder with history moderate nicotine dependence of many years duration in a patient who is poor candidate for success because of mood disorder, enjoyment of smoking and lack of success to date.  Agreed with plan to keep smoking at <10 cigarettes per week.

## 2018-03-18 NOTE — Assessment & Plan Note (Signed)
Diabetes longstanding and improved control (without Jardiance for 1 week).   -Continued GLP-1 Victoza (generic name liraglutide) at 1.8mg  daily dose.  -Discontinued SGLT2-I Jardiance as blood sugar is under excellent control at this time and his urinary incontinence may be due to this medication.   -Next A1C anticipated 1 month.

## 2018-03-18 NOTE — Patient Instructions (Signed)
Keep up the great work, avoiding smoking (less than 10 cigs per week).   Continue all of your medicine unchanged today.   Possibly a change with Jardiance STOP if OK with Dr. Wonda Olds.

## 2018-03-18 NOTE — Patient Instructions (Signed)
  STOP TAKING JARDIANCE (25 mg)   We'll check your labs today We'll see you in 1 month.   If you have questions or concerns please do not hesitate to call at [phone].  [name], DO PGY-3, [facility] 03/18/2018 10:42 AM

## 2018-03-18 NOTE — Progress Notes (Signed)
    S:     Chief Complaint  Patient presents with  . Medication Management    Diabetes, tobacco    Patient arrives with complaint of balance with slurred speech and noted to have sedation during visit.  Presents for diabetes evaluation, education, and management at the request of Dr. Wonda Olds.  Patient seen today by Primary Care Provider [name] in [facility] clinic visit.   Patient reports adherence with medications with use of self-filled pill box.  However, he was out of Omeprazole, and out of Jardiance for 1 week.   Current diabetes medications include: Liraglutide 1.8mg  daily, Metformin 750mg  XR daily, and jardiance 25mg  which has been stopped for 1 week and he did NOT report urination issues.   Patient denies hypoglycemic events.  Current number of cigarettes/day 2-10 over the last week.     Most recent quit attempt over the last few months (quit for 1 week or a few days on multiple occasions.   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  Pulmonary/Chest: No respiratory distress.   Review of Systems  Neurological: Positive for dizziness and speech change.     Lab Results  Component Value Date   HGBA1C 10.2 (A) 01/24/2018   Vitals deferred to physician appoint today.   Home fasting CBG: seven day average 155 with AM CBGs only.    A/P: Diabetes longstanding and improved control (without Jardiance for 1 week).   -Continued GLP-1 Victoza (generic name liraglutide) at 1.8mg  daily dose.  -Discontinued SGLT2-I Jardiance as blood sugar is under excellent control at this time and his urinary incontinence may be due to this medication.   -Next A1C anticipated 1 month.   Tobacco use disorder with history moderate nicotine dependence of many years duration in a patient who is poor candidate for success because of mood disorder, enjoyment of smoking and  lack of success to date.  Agreed with plan to keep smoking at <10 cigarettes per week.    Written patient instructions provided.  Total time in face to face counseling 35 minutes.   Follow up Pharmacist Clinic Visit PRN.   Patient seen with Belva Agee, PharmD Candidate.

## 2018-03-19 LAB — BASIC METABOLIC PANEL
BUN/Creatinine Ratio: 8 — ABNORMAL LOW (ref 10–24)
BUN: 12 mg/dL (ref 8–27)
CALCIUM: 9.4 mg/dL (ref 8.6–10.2)
CO2: 14 mmol/L — AB (ref 20–29)
CREATININE: 1.54 mg/dL — AB (ref 0.76–1.27)
Chloride: 101 mmol/L (ref 96–106)
GFR calc Af Amer: 55 mL/min/{1.73_m2} — ABNORMAL LOW (ref >59)
GFR calc non Af Amer: 48 mL/min/{1.73_m2} — ABNORMAL LOW (ref >59)
GLUCOSE: 279 mg/dL — AB (ref 65–99)
Potassium: 4.4 mmol/L (ref 3.5–5.2)
SODIUM: 139 mmol/L (ref 134–144)

## 2018-03-19 LAB — HEPATITIS C ANTIBODY

## 2018-03-19 LAB — LIPID PANEL
CHOLESTEROL TOTAL: 187 mg/dL (ref 100–199)
Chol/HDL Ratio: 4.7 ratio (ref 0.0–5.0)
HDL: 40 mg/dL (ref >39)
LDL Calculated: 77 mg/dL (ref 0–99)
Triglycerides: 352 mg/dL — ABNORMAL HIGH (ref 0–149)
VLDL CHOLESTEROL CAL: 70 mg/dL — AB (ref 5–40)

## 2018-03-21 ENCOUNTER — Encounter: Payer: Self-pay | Admitting: Family Medicine

## 2018-03-21 NOTE — Progress Notes (Signed)
  Letter sent to pt w/ results   [name], DO PGY-3, [facility] 03/21/2018 12:36 PM

## 2018-03-25 ENCOUNTER — Encounter: Payer: Self-pay | Admitting: Allergy and Immunology

## 2018-03-25 ENCOUNTER — Ambulatory Visit: Payer: Medicaid Other | Admitting: Allergy and Immunology

## 2018-03-25 VITALS — BP 128/80 | HR 76 | Resp 16

## 2018-03-25 DIAGNOSIS — Z720 Tobacco use: Secondary | ICD-10-CM

## 2018-03-25 DIAGNOSIS — J4530 Mild persistent asthma, uncomplicated: Secondary | ICD-10-CM | POA: Diagnosis not present

## 2018-03-25 DIAGNOSIS — J3089 Other allergic rhinitis: Secondary | ICD-10-CM

## 2018-03-25 MED ORDER — ALBUTEROL SULFATE HFA 108 (90 BASE) MCG/ACT IN AERS: 1.0000 | INHALATION_SPRAY | Freq: Four times a day (QID) | RESPIRATORY_TRACT | 2 refills | Status: DC | PRN

## 2018-03-25 NOTE — Assessment & Plan Note (Signed)
Stable. Continue appropriate allergen avoidance measures, azelastine 1 spray per nostril 1-2 times daily if needed, and nasal saline spray as needed. 

## 2018-03-25 NOTE — Patient Instructions (Addendum)
Mild persistent asthma Per patient's report, his asthma is currently well controlled.  He is not using Flovent but takes albuterol when needed.  Continue albuterol HFA, 1 to 2 inhalations every 4-6 hours as needed.  During respiratory tract infections or asthma flares, add Flovent 110g 2 inhalations via spacer device 2 times per day until symptoms have returned to baseline.  Subjective and objective measures of pulmonary function will be followed and the treatment plan will be adjusted accordingly.  Allergic rhinitis with a nonallergic component Stable.  Continue appropriate allergen avoidance measures, azelastine 1 spray per nostril 1-2 times daily if needed, and nasal saline spray as needed.  Tobacco abuse  The patient's attempts to decrease cigarette smoking have been encouraged.   Return in about 6 months (around 09/23/2018), or if symptoms worsen or fail to improve.

## 2018-03-25 NOTE — Assessment & Plan Note (Signed)
   The patient's attempts to decrease cigarette smoking have been encouraged.

## 2018-03-25 NOTE — Assessment & Plan Note (Signed)
Per patient's report, his asthma is currently well controlled.  He is not using Flovent but takes albuterol when needed.  Continue albuterol HFA, 1 to 2 inhalations every 4-6 hours as needed.  During respiratory tract infections or asthma flares, add Flovent 110g 2 inhalations via spacer device 2 times per day until symptoms have returned to baseline.  Subjective and objective measures of pulmonary function will be followed and the treatment plan will be adjusted accordingly.

## 2018-03-25 NOTE — Progress Notes (Signed)
Follow-up Note  RE: Dylan Jordan MRN: [id number] DOB: February 01, 1956 Date of Office Visit: 03/25/2018  Primary care provider: Tillman Sers, DO Referring provider: Tillman Sers, DO  History of present illness: Dylan Jordan is a 62 y.o. male with mixed rhinitis (predominantly nonallergic) and mild persistent asthma presenting today for follow-up.  He was last seen in this clinic on Sep 25, 2017.  He reports that in the interval since his previous visit his asthma has been well controlled.  He rarely requires albuterol rescue, does not experience nocturnal awakenings due to lower respiratory symptoms, and does not experience limitations in normal daily activities due to asthma symptoms.  He does have occasional "coughing spells" which are relieved with albuterol.  He reports that he is attempting to cut back on the number of cigarettes he smokes per day.  He notes that he has been experiencing some nasal congestion recently, however reports that he has not been using medicated nasal spray.  Assessment and plan: Mild persistent asthma Per patient's report, his asthma is currently well controlled.  He is not using Flovent but takes albuterol when needed.  Continue albuterol HFA, 1 to 2 inhalations every 4-6 hours as needed.  During respiratory tract infections or asthma flares, add Flovent 110g 2 inhalations via spacer device 2 times per day until symptoms have returned to baseline.  Subjective and objective measures of pulmonary function will be followed and the treatment plan will be adjusted accordingly.  Allergic rhinitis with a nonallergic component Stable.  Continue appropriate allergen avoidance measures, azelastine 1 spray per nostril 1-2 times daily if needed, and nasal saline spray as needed.  Tobacco abuse  The patient's attempts to decrease cigarette smoking have been encouraged.   Meds ordered this encounter  Medications  . albuterol (PROVENTIL HFA;VENTOLIN HFA)  108 (90 Base) MCG/ACT inhaler    Sig: Inhale 1-2 puffs into the lungs every 6 (six) hours as needed for wheezing or shortness of breath.    Dispense:  1 Inhaler    Refill:  2    Diagnostics: Spirometry reveals an FVC of 3.24 L and an FEV1 of 2.60 L (72% predicted) with an FEV1 ratio of 103%.  Mild restrictive pattern.  This study was performed while the patient was asymptomatic.  Please see scanned spirometry results for details.    Physical examination: Blood pressure 128/80, pulse 76, resp. rate 16.  General: Alert, interactive, in no acute distress. HEENT: TMs pearly gray, turbinates mildly edematous without discharge, post-pharynx moderately erythematous. Neck: Supple without lymphadenopathy. Lungs: Clear to auscultation without wheezing, rhonchi or rales. CV: Normal S1, S2 without murmurs. Skin: Warm and dry, without lesions or rashes.  The following portions of the patient's history were reviewed and updated as appropriate: allergies, current medications, past family history, past medical history, past social history, past surgical history and problem list.  Allergies as of 03/25/2018      Reactions   Chantix [varenicline Tartrate] Other (See Comments)   Nightmares   Ativan [lorazepam] Other (See Comments)   Agitation (and "5 others like it") Will need to call [facility] [place] (phone) [phone] and [phone] to fax a release-   Penicillins Diarrhea   Has patient had a PCN reaction causing immediate rash, facial/tongue/throat swelling, SOB or lightheadedness with hypotension: No Has patient had a PCN reaction causing severe rash involving mucus membranes or skin necrosis: No Has patient had a PCN reaction that required hospitalization: No Has patient had a PCN  reaction occurring within the last 10 years: No If all of the above answers are "NO", then may proceed with Cephalosporin use.      Medication List        Accurate as of 03/25/18  9:16 PM.  Always use your most recent med list.          ACCU-CHEK AVIVA PLUS test strip Generic drug:  glucose blood use as directed to check blood sugar THREE TIMES DAILY   ACCU-CHEK SOFTCLIX LANCETS lancets use as directed *may use THREE TIMES DAILY to check blood sugar*   acetaminophen 650 MG CR tablet Commonly known as:  TYLENOL Take 650 mg by mouth 2 (two) times daily as needed for pain.   albuterol 108 (90 Base) MCG/ACT inhaler Commonly known as:  PROVENTIL HFA;VENTOLIN HFA Inhale 1-2 puffs into the lungs every 6 (six) hours as needed for wheezing or shortness of breath.   allopurinol 100 MG tablet Commonly known as:  ZYLOPRIM Take 2 tablets (200 mg total) by mouth daily.   atorvastatin 20 MG tablet Commonly known as:  LIPITOR Take 1 tablet (20 mg total) by mouth daily.   benztropine 2 MG tablet Commonly known as:  COGENTIN Take 2 mg by mouth 2 (two) times daily.   Colchicine 0.6 MG Caps Take 0.6 mg by mouth daily. Take 2 tablets once, then take 1 tablet one hour later.   DAILY-VITE Tabs TAKE 1 TABLET BY MOUTH DAILY.   divalproex 500 MG DR tablet Commonly known as:  DEPAKOTE Take 500-2,000 mg by mouth See admin instructions. Take 500mg  every morning and 2000 tabs every evening   ferrous sulfate 325 (65 FE) MG tablet Take 1 tablet (325 mg total) by mouth daily with breakfast.   fluticasone 110 MCG/ACT inhaler Commonly known as:  FLOVENT HFA Inhale 2 puffs into the lungs 2 (two) times daily.   gabapentin 300 MG capsule Commonly known as:  NEURONTIN Take 1 capsule (300 mg total) by mouth 3 (three) times daily as needed.   glimepiride 2 MG tablet Commonly known as:  AMARYL Take 1 tablet (2 mg total) by mouth daily with breakfast.   haloperidol 5 MG tablet Commonly known as:  HALDOL Take 5 mg by mouth at bedtime.   haloperidol decanoate 100 MG/ML injection Commonly known as:  HALDOL DECANOATE Inject 400 mg into the muscle every 28 (twenty-eight) days.     liraglutide 18 MG/3ML Sopn Commonly known as:  VICTOZA Inject 0.3 mLs (1.8 mg total) into the skin daily.   lithium carbonate 300 MG capsule Take 300 mg by mouth 2 (two) times daily with a meal.   losartan 25 MG tablet Commonly known as:  COZAAR Take 1 tablet (25 mg total) by mouth daily.   metFORMIN 750 MG 24 hr tablet Commonly known as:  GLUCOPHAGE-XR Take 1 tablet (750 mg total) by mouth daily with breakfast.   omeprazole 20 MG capsule Commonly known as:  PRILOSEC Take 2 capsules (40 mg total) by mouth every evening.   Pen Needles 32G X 5 MM Misc 1 pen by Does not apply route daily.       Allergies  Allergen Reactions  . Chantix [Varenicline Tartrate] Other (See Comments)    Nightmares  . Ativan [Lorazepam] Other (See Comments)    Agitation (and "5 others like it") Will need to call [facility] [place] (phone) [phone] and [phone] to fax a release-   . Penicillins Diarrhea    Has patient had a  PCN reaction causing immediate rash, facial/tongue/throat swelling, SOB or lightheadedness with hypotension: No Has patient had a PCN reaction causing severe rash involving mucus membranes or skin necrosis: No Has patient had a PCN reaction that required hospitalization: No Has patient had a PCN reaction occurring within the last 10 years: No If all of the above answers are "NO", then may proceed with Cephalosporin use.    Review of systems: Review of systems negative except as noted in HPI / PMHx or noted below: Constitutional: Negative.  HENT: Negative.   Eyes: Negative.  Respiratory: Negative.   Cardiovascular: Negative.  Gastrointestinal: Negative.  Genitourinary: Negative.  Musculoskeletal: Negative.  Neurological: Negative.  Endo/Heme/Allergies: Negative.  Cutaneous: Negative.  Past Medical History:  Diagnosis Date  . Anemia   . Asthma   . Diabetes mellitus without complication (HCC)   . Gout   . Hyperlipidemia   . Hypertension      Family History  Adopted: Yes  Problem Relation Age of Onset  . Allergic rhinitis Neg Hx   . Angioedema Neg Hx   . Asthma Neg Hx   . Atopy Neg Hx   . Eczema Neg Hx   . Immunodeficiency Neg Hx   . Urticaria Neg Hx     Social History   Socioeconomic History  . Marital status: Single    Spouse name: Not on file  . Number of children: Not on file  . Years of education: Not on file  . Highest education level: Not on file  Occupational History  . Not on file  Social Needs  . Financial resource strain: Not on file  . Food insecurity:    Worry: Not on file    Inability: Not on file  . Transportation needs:    Medical: Not on file    Non-medical: Not on file  Tobacco Use  . Smoking status: Current Some Day Smoker    Packs/day: 0.30    Years: 45.00    Pack years: 13.50    Types: Cigarettes    Start date: 05/16/1971  . Smokeless tobacco: Never Used  . Tobacco comment: Decreased Intake to 10 cigarettes per week or less.  Substance and Sexual Activity  . Alcohol use: No  . Drug use: No  . Sexual activity: Not on file  Lifestyle  . Physical activity:    Days per week: Not on file    Minutes per session: Not on file  . Stress: Not on file  Relationships  . Social connections:    Talks on phone: Not on file    Gets together: Not on file    Attends religious service: Not on file    Active member of club or organization: Not on file    Attends meetings of clubs or organizations: Not on file    Relationship status: Not on file  . Intimate partner violence:    Fear of current or ex partner: Not on file    Emotionally abused: Not on file    Physically abused: Not on file    Forced sexual activity: Not on file  Other Topics Concern  . Not on file  Social History Narrative  . Not on file    I appreciate the opportunity to take part in Dylan Jordan's care. Please do not hesitate to contact me with questions.  Sincerely,   R. Jorene Guest, MD

## 2018-03-27 ENCOUNTER — Telehealth: Payer: Self-pay

## 2018-03-27 NOTE — Telephone Encounter (Signed)
Patient left message that he is getting calls from someone named Gunnar Fusiaula trying to set him up with a heart consultant in [place] that PCP apparently referred him for.  Stated he had a normal lexiscan stress test in August so does not know what this referral is about.  Call back is [phone].  Ples SpecterAlisa Jeral Zick, RN [facility] RN)

## 2018-03-28 NOTE — Telephone Encounter (Signed)
LM for patient with message from MD.  Milbert Bixler,CMA ° °

## 2018-03-28 NOTE — Telephone Encounter (Signed)
I'm not sure who that is either. I would tell him to let Gunnar Fusiaula know he does not need to see another cardiologist. Thanks!

## 2018-04-02 ENCOUNTER — Ambulatory Visit: Payer: Medicaid Other | Admitting: Podiatry

## 2018-04-02 ENCOUNTER — Ambulatory Visit: Payer: Medicaid Other | Admitting: Allergy and Immunology

## 2018-04-08 ENCOUNTER — Ambulatory Visit: Payer: Medicaid Other | Admitting: Podiatry

## 2018-04-08 DIAGNOSIS — L84 Corns and callosities: Secondary | ICD-10-CM

## 2018-04-08 DIAGNOSIS — B351 Tinea unguium: Secondary | ICD-10-CM

## 2018-04-08 DIAGNOSIS — M79676 Pain in unspecified toe(s): Secondary | ICD-10-CM | POA: Diagnosis not present

## 2018-04-08 DIAGNOSIS — M79609 Pain in unspecified limb: Principal | ICD-10-CM

## 2018-04-08 DIAGNOSIS — E1142 Type 2 diabetes mellitus with diabetic polyneuropathy: Secondary | ICD-10-CM

## 2018-04-08 NOTE — Patient Instructions (Signed)
Onychomycosis/Fungal Toenails  WHAT IS IT? An infection that lies within the keratin of your nail plate that is caused by a fungus.  WHY ME? Fungal infections affect all ages, sexes, races, and creeds.  There may be many factors that predispose you to a fungal infection such as age, coexisting medical conditions such as diabetes, or an autoimmune disease; stress, medications, fatigue, genetics, etc.  Bottom line: fungus thrives in a warm, moist environment and your shoes offer such a location.  IS IT CONTAGIOUS? Theoretically, yes.  You do not want to share shoes, nail clippers or files with someone who has fungal toenails.  Walking around barefoot in the same room or sleeping in the same bed is unlikely to transfer the organism.  It is important to realize, however, that fungus can spread easily from one nail to the next on the same foot.  HOW DO WE TREAT THIS?  There are several ways to treat this condition.  Treatment may depend on many factors such as age, medications, pregnancy, liver and kidney conditions, etc.  It is best to ask your doctor which options are available to you.  1. No treatment.   Unlike many other medical concerns, you can live with this condition.  However for many people this can be a painful condition and may lead to ingrown toenails or a bacterial infection.  It is recommended that you keep the nails cut short to help reduce the amount of fungal nail. 2. Topical treatment.  These range from herbal remedies to prescription strength nail lacquers.  About 40-50% effective, topicals require twice daily application for approximately 9 to 12 months or until an entirely new nail has grown out.  The most effective topicals are medical grade medications available through physicians offices. 3. Oral antifungal medications.  With an 80-90% cure rate, the most common oral medication requires 3 to 4 months of therapy and stays in your system for a year as the new nail grows out.  Oral  antifungal medications do require blood work to make sure it is a safe drug for you.  A liver function panel will be performed prior to starting the medication and after the first month of treatment.  It is important to have the blood work performed to avoid any harmful side effects.  In general, this medication safe but blood work is required. 4. Laser Therapy.  This treatment is performed by applying a specialized laser to the affected nail plate.  This therapy is noninvasive, fast, and non-painful.  It is not covered by insurance and is therefore, out of pocket.  The results have been very good with a 80-95% cure rate.  [facility] is the only practice in the area to offer this therapy. 5. Permanent Nail Avulsion.  Removing the entire nail so that a new nail will not grow back.  Diabetes and Foot Care Diabetes may cause you to have problems because of poor blood supply (circulation) to your feet and legs. This may cause the skin on your feet to become thinner, break easier, and heal more slowly. Your skin may become dry, and the skin may peel and crack. You may also have nerve damage in your legs and feet causing decreased feeling in them. You may not notice minor injuries to your feet that could lead to infections or more serious problems. Taking care of your feet is one of the most important things you can do for yourself. Follow these instructions at home:  Wear   shoes at all times, even in the house. Do not go barefoot. Bare feet are easily injured.  Check your feet daily for blisters, cuts, and redness. If you cannot see the bottom of your feet, use a mirror or ask someone for help.  Wash your feet with warm water (do not use hot water) and mild soap. Then pat your feet and the areas between your toes until they are completely dry. Do not soak your feet as this can dry your skin.  Apply a moisturizing lotion or petroleum jelly (that does not contain alcohol and is unscented) to the  skin on your feet and to dry, brittle toenails. Do not apply lotion between your toes.  Trim your toenails straight across. Do not dig under them or around the cuticle. File the edges of your nails with an emery board or nail file.  Do not cut corns or calluses or try to remove them with medicine.  Wear clean socks or stockings every day. Make sure they are not too tight. Do not wear knee-high stockings since they may decrease blood flow to your legs.  Wear shoes that fit properly and have enough cushioning. To break in new shoes, wear them for just a few hours a day. This prevents you from injuring your feet. Always look in your shoes before you put them on to be sure there are no objects inside.  Do not cross your legs. This may decrease the blood flow to your feet.  If you find a minor scrape, cut, or break in the skin on your feet, keep it and the skin around it clean and dry. These areas may be cleansed with mild soap and water. Do not cleanse the area with peroxide, alcohol, or iodine.  When you remove an adhesive bandage, be sure not to damage the skin around it.  If you have a wound, look at it several times a day to make sure it is healing.  Do not use heating pads or hot water bottles. They may burn your skin. If you have lost feeling in your feet or legs, you may not know it is happening until it is too late.  Make sure your health care provider performs a complete foot exam at least annually or more often if you have foot problems. Report any cuts, sores, or bruises to your health care provider immediately. Contact a health care provider if:  You have an injury that is not healing.  You have cuts or breaks in the skin.  You have an ingrown nail.  You notice redness on your legs or feet.  You feel burning or tingling in your legs or feet.  You have pain or cramps in your legs and feet.  Your legs or feet are numb.  Your feet always feel cold. Get help right away  if:  There is increasing redness, swelling, or pain in or around a wound.  There is a red line that goes up your leg.  Pus is coming from a wound.  You develop a fever or as directed by your health care provider.  You notice a bad smell coming from an ulcer or wound. This information is not intended to replace advice given to you by your health care provider. Make sure you discuss any questions you have with your health care provider. Document Released: 04/28/2000 Document Revised: 10/07/2015 Document Reviewed: 10/08/2012 Elsevier Interactive Patient Education  2017 Elsevier Inc.  

## 2018-04-09 ENCOUNTER — Other Ambulatory Visit: Payer: Self-pay

## 2018-04-09 DIAGNOSIS — E1140 Type 2 diabetes mellitus with diabetic neuropathy, unspecified: Secondary | ICD-10-CM

## 2018-04-09 MED ORDER — LIRAGLUTIDE 18 MG/3ML ~~LOC~~ SOPN: 1.8000 mg | PEN_INJECTOR | Freq: Every day | SUBCUTANEOUS | 2 refills | Status: DC

## 2018-04-10 ENCOUNTER — Encounter: Payer: Self-pay | Admitting: Podiatry

## 2018-04-10 NOTE — Progress Notes (Signed)
Subjective: Dylan Jordan presents today with h/o diabetic neuropathy and painful, thick toenails 1-5 b/l that he cannot cut and which interfere with daily activities.  Pain is aggravated when wearing enclosed shoe gear.  He takes gabapentin for his neuropathy.  [name] insists on having podiatry appointment every 5 weeks stating his feet are that painful.  ROS: +Numbness to feet  Objective: Vascular Examination: Capillary refill time immediate x 10 digits Dorsalis pedis and Posterior tibial pulses palpable b/l Digital hair x 10 digits is sparse Skin temperature gradient WNL b/l  Dermatological Examination: Skin with normal turgor, texture and tone b/l  Toenails 1-5 b/l discolored, thick, dystrophic with subungual debris and pain with palpation to nailbeds due to thickness of nails.  Hyperkeratotic lesion noted dorsal 5th PIPJ left foot with no erythema, no edema, no drainage, no warmth nor flocculence.  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 Vibratory sensation intact.  Assessment: 1. Painful onychomycosis toenails 1-5 b/l  2. Corn left 5th dgit 3. NIDDM with neuropathy  Plan: 1. Toenails 1-5 b/l were debrided in length and girth without iatrogenic bleeding. 2. Corn pared left 5th digit PIPJ without incident 3. Patient to continue soft, supportive shoe gear 4. Patient to report any pedal injuries to medical professional immediately. 5. Regarding 5 week appointments, I currently see no reason for this frequency. He has no impending wounds or preulcerative lesions. Follow up 3 months. Patient/POA to call should there be a concern in the interim.

## 2018-04-26 ENCOUNTER — Encounter: Payer: Self-pay | Admitting: Family Medicine

## 2018-04-26 ENCOUNTER — Other Ambulatory Visit: Payer: Self-pay

## 2018-04-26 ENCOUNTER — Ambulatory Visit: Payer: Medicaid Other | Admitting: Family Medicine

## 2018-04-26 VITALS — BP 128/74 | HR 84 | Temp 97.6°F | Wt 232.0 lb

## 2018-04-26 DIAGNOSIS — E1140 Type 2 diabetes mellitus with diabetic neuropathy, unspecified: Secondary | ICD-10-CM

## 2018-04-26 DIAGNOSIS — Z720 Tobacco use: Secondary | ICD-10-CM | POA: Diagnosis not present

## 2018-04-26 DIAGNOSIS — N3946 Mixed incontinence: Secondary | ICD-10-CM | POA: Diagnosis not present

## 2018-04-26 LAB — POCT GLYCOSYLATED HEMOGLOBIN (HGB A1C): HBA1C, POC (CONTROLLED DIABETIC RANGE): 7.5 % — AB (ref 0.0–7.0)

## 2018-04-26 NOTE — Patient Instructions (Signed)
  Great to see you today Keep up the good work with trying to quit smoking No medication changes made today Follow up 1 month to check in.  If you have questions or concerns please do not hesitate to call at [phone].  Dolores PattyAngela Kshawn Canal, DO PGY-3, [facility] 04/26/2018 1:49 PM

## 2018-04-26 NOTE — Assessment & Plan Note (Signed)
  Much improved to 7.5 today, continue metformin 750 wam, amaryl 2mg  every morning, victoza 1.8 mg daily. Next A1C 3 months

## 2018-04-26 NOTE — Progress Notes (Signed)
    Subjective:    Patient ID: Dylan Jordan, male    DOB: 1955/09/19, 62 y.o.   MRN: [id number]   CC: follow up DM, incontinence  DM- sugars are much better, ranging 130-220. He denies increased thirst, hunger, urination. He is still drinking a lot of sodas. He reports compliance with his medication regimen. Denies nausea or stomach upset.   Incontinence- this has stopped since discontinuing jardiance and seeing urology, they started him on flomax and told him to discontinue soda intake. He is pleased by this.  Smoking- reports he is smoking 6-7 cigarettes a day. Plans to quit this weekend. Smoking status reviewed- current smoker  Review of Systems- see HPI   Objective:  BP 128/74   Pulse 84   Temp 97.6 F (36.4 C) (Oral)   Wt 232 lb (105.2 kg)   SpO2 97%   BMI 30.61 kg/m  Vitals and nursing note reviewed  General: well nourished, in no acute distress HEENT: normocephalic, TM's visualized bilaterally, no scleral icterus or conjunctival pallor, no nasal discharge, moist mucous membranes, good dentition without erythema or discharge noted in posterior oropharynx Neck: supple, non-tender, without lymphadenopathy Cardiac: RRR, clear S1 and S2, no murmurs, rubs, or gallops Respiratory: clear to auscultation bilaterally, no increased work of breathing Abdomen: soft, nontender, nondistended, no masses or organomegaly. Bowel sounds present Extremities: no edema or cyanosis. Warm, well perfused. 2+ radial and PT pulses bilaterally Skin: warm and dry, no rashes noted Neuro: alert and oriented, no focal deficits   Assessment & Plan:    Type 2 diabetes mellitus with diabetic neuropathy (HCC)  Much improved to 7.5 today, continue metformin 750 wam, amaryl 2mg  every morning, victoza 1.8 mg daily. Next A1C 3 months  Tobacco abuse  Patient reports 6-7 cigarettes a day. Encouraged him to stop smoking.  Mixed stress and urge urinary incontinence  Resolved after discontinuing  jardiance. Patient saw urology and was put on flomax.  Return in about 4 weeks (around 05/24/2018).   Dolores PattyAngela Raesha Coonrod, DO Family Medicine Resident PGY-3

## 2018-04-26 NOTE — Assessment & Plan Note (Signed)
  Resolved after discontinuing jardiance. Patient saw urology and was put on flomax.

## 2018-04-26 NOTE — Assessment & Plan Note (Signed)
  Patient reports 6-7 cigarettes a day. Encouraged him to stop smoking.

## 2018-04-29 ENCOUNTER — Telehealth: Payer: Self-pay

## 2018-04-29 NOTE — Telephone Encounter (Signed)
Pt called again- pt stated [facility] for his medications. Pt could not give me the provides name, as he did not know it. Pt did provide his phone number [phone].

## 2018-04-29 NOTE — Telephone Encounter (Signed)
In my experience [facility] has given us a list of labs they want ordered, [name] should have a letter from them or we could call to find out which labs they want? Usually patients bring it in or it is faxed to me.

## 2018-04-29 NOTE — Telephone Encounter (Signed)
That's all the lab work we did Friday

## 2018-04-29 NOTE — Telephone Encounter (Signed)
Pt LVM on nurse line requesting the results of his lab work on Friday. I looked in his chart and only saw an a1c?

## 2018-04-30 NOTE — Telephone Encounter (Signed)
Attempted to contact [name], was unable to get anyone on the phone. Will continue to try.

## 2018-05-03 ENCOUNTER — Other Ambulatory Visit: Payer: Self-pay

## 2018-05-03 MED ORDER — GABAPENTIN 300 MG PO CAPS: 300.0000 mg | ORAL_CAPSULE | Freq: Three times a day (TID) | ORAL | 1 refills | Status: DC | PRN

## 2018-05-03 NOTE — Telephone Encounter (Signed)
Patient calling back about this. Please continue to try to call [name] to get labs that need to be drawn.  Ples SpecterAlisa Ayshia Gramlich, RN [facility] RN)

## 2018-05-03 NOTE — Telephone Encounter (Signed)
Sp[facility] with someone at [facility] (generalized Public librariantelephone operator) she will send a note to the [place] nurses for them to call us with the requested labs.

## 2018-05-09 ENCOUNTER — Telehealth: Payer: Self-pay

## 2018-05-09 NOTE — Telephone Encounter (Signed)
Pharmacy called nurse line to clarify pts metformin prescription. Last script in august was written for once daily. If this is correct please send in new rx ro reflect this. Pharmacist stated they only have a prescription for twice daily from July.

## 2018-05-09 NOTE — Telephone Encounter (Signed)
Please inform pharmacy that patient needs to be on metformin two times a day. A1c is still too high to be on the once a day regimen.  Thanks  Lovena NeighboursAbdoulaye Eyvonne Burchfield, MD [facility], PGY-3

## 2018-05-10 ENCOUNTER — Other Ambulatory Visit: Payer: Self-pay | Admitting: Family Medicine

## 2018-05-10 DIAGNOSIS — E1140 Type 2 diabetes mellitus with diabetic neuropathy, unspecified: Secondary | ICD-10-CM

## 2018-05-10 MED ORDER — METFORMIN HCL ER 750 MG PO TB24: 750.0000 mg | ORAL_TABLET | Freq: Every day | ORAL | 0 refills | Status: DC

## 2018-05-10 NOTE — Telephone Encounter (Signed)
Can you send a new rx reflecting this per pharmacy. Thanks!

## 2018-05-10 NOTE — Telephone Encounter (Signed)
Will send new script for once a day Metformin 750 mg as instructed by PCP.  Thanks  Lovena NeighboursAbdoulaye Lincoln Ginley, MD [facility], PGY-3

## 2018-05-13 ENCOUNTER — Other Ambulatory Visit: Payer: Self-pay | Admitting: Family Medicine

## 2018-05-13 NOTE — Progress Notes (Signed)
  At last visit [name] expressed interest in seeing ortho again for his back pain, per Dr. Warren Jordan's note May 2018 he should return to see Dr. Roda Jordan if pain worsens. Please ask [name] to schedule appt w/ Dr. Roda Jordan, if he needs a new referral I can order that.

## 2018-05-16 ENCOUNTER — Other Ambulatory Visit: Payer: Self-pay | Admitting: Family Medicine

## 2018-05-27 ENCOUNTER — Other Ambulatory Visit: Payer: Self-pay

## 2018-05-27 ENCOUNTER — Telehealth: Payer: Self-pay | Admitting: Family Medicine

## 2018-05-27 MED ORDER — ACCU-CHEK AVIVA PLUS W/DEVICE KIT: PACK | 0 refills | Status: DC

## 2018-05-27 NOTE — Telephone Encounter (Signed)
PT called  and he is stating about his Diabetes and Glucose reading machine. Please call his pharmacy at [facility] at [phone]. ad

## 2018-05-27 NOTE — Telephone Encounter (Signed)
Patient left message that he lost his glucometer and needs a replacement sent to [facility].  Ples SpecterAlisa Seleste Tallman, RN [facility] RN)

## 2018-05-28 NOTE — Telephone Encounter (Signed)
This has been taking care of.

## 2018-05-30 ENCOUNTER — Ambulatory Visit: Payer: Medicaid Other | Admitting: Pharmacist

## 2018-05-30 ENCOUNTER — Encounter: Payer: Self-pay | Admitting: Pharmacist

## 2018-05-30 DIAGNOSIS — E1140 Type 2 diabetes mellitus with diabetic neuropathy, unspecified: Secondary | ICD-10-CM

## 2018-05-30 NOTE — Progress Notes (Signed)
    S:     Chief Complaint  Patient presents with  . Medication Management    diabetes    Patient arrives in good spirits and ambulates well without assistance.  Presents for diabetes evaluation, education, and management.  Diabetes diagnosed 05/2016. Five days ago patient began having tooth pain.  He notes a visit for possible extraction is scheduled next week.   Patient reports adherence with medications and used medication box effectively. Current diabetes medications include: Victoza (liraglutide) 1.8 mg daily; metformin 750 mg XR daily; glimepiride 2 mg daily.  Current hypertension medications include: losartan 25 mg daily  Patient denies hypoglycemic events.  Patient reported dietary habits: Eats 3 Meals/day.  Drinks: soda   Patient reports nocturia and wakes up at least 2 to 3 times every night.     O:  Physical Exam Vitals signs reviewed.  Constitutional:      Appearance: Normal appearance.  Neurological:     Mental Status: He is alert.      Review of Systems  Genitourinary: Positive for urgency.  All other systems reviewed and are negative.    Lab Results  Component Value Date   HGBA1C 7.5 (A) 04/26/2018   Vitals:   05/30/18 1447  BP: 128/66  Pulse: 74  SpO2: 98%    Lipid Panel     Component Value Date/Time   CHOL 187 03/18/2018 1127   TRIG 352 (H) 03/18/2018 1127   HDL 40 03/18/2018 1127   CHOLHDL 4.7 03/18/2018 1127   LDLCALC 77 03/18/2018 1127      Clinical ASCVD: No  The 10-year ASCVD risk score Denman George DC [name], et al., 2013) is: 41.8%   Values used to calculate the score:     Age: 63 years     Sex: Male     Is Non-Hispanic African American: Yes     Diabetic: Yes     Tobacco smoker: Yes     Systolic Blood Pressure: 128 mmHg     Is BP treated: Yes     HDL Cholesterol: 40 mg/dL     Total Cholesterol: 187 mg/dL    A/P: Diabetes longstanding and currently uncontrolled. Patient reports "tooth infection" of recent onset and will have  tooth pulled next week.  Patient is adherent with medication. Control is suboptimal due to tooth pain/infection. -Increased dose of glimepiride to one full tablet (4 mg) daily from 2mg  previously until dental procedure is complete and until clinic follow-up the first week of February.  -Counseled on reducing soda intake. -Counseled on s/sx of and management of hypoglycemia.  Asked patient to call if blood glucose readings are low (< 80).   Total time in face to face counseling:  30 minutes.  Patient seen with Trina Ao, PharmD Candidate.

## 2018-05-30 NOTE — Assessment & Plan Note (Signed)
Diabetes longstanding and currently uncontrolled. Patient reports "tooth infection" of recent onset and will have tooth pulled next week.  Patient is adherent with medication. Control is suboptimal due to tooth pain/infection. -Increased dose of glimepiride to one full tablet (4 mg) daily from 2mg  previously until dental procedure is complete and until clinic follow-up the first week of February.  -Counseled on reducing soda intake. -Counseled on s/sx of and management of hypoglycemia.  Asked patient to call if blood glucose readings are low (< 80).

## 2018-05-30 NOTE — Progress Notes (Signed)
Patient ID: Dylan Jordan, male   DOB: [birth year], 63 y.o.   MRN: [id number] Reviewed: I agree with Dr. Macky Lower docuemntation and management.

## 2018-05-30 NOTE — Patient Instructions (Signed)
It was great to see you today!  Increase glimepiride to one full tablet daily.  We will see you for follow up in the pharmacy clinic the first week of February.

## 2018-05-31 ENCOUNTER — Other Ambulatory Visit: Payer: Self-pay

## 2018-05-31 MED ORDER — GLUCOSE BLOOD VI STRP: ORAL_STRIP | 12 refills | Status: DC

## 2018-05-31 NOTE — Telephone Encounter (Signed)
Patient called that he needs test strips. Has Accu-chek Guide glucometer.  Ples SpecterAlisa Brake, RN [facility] RN)

## 2018-06-10 ENCOUNTER — Ambulatory Visit: Payer: Medicaid Other | Admitting: Podiatry

## 2018-06-10 ENCOUNTER — Telehealth: Payer: Self-pay

## 2018-06-10 NOTE — Telephone Encounter (Signed)
Pt calls nurse line stating he is in [place] visiting his mother and he forgot his victoza at home. Pt states he is pretty sure he left the 3 unopened pens out. Pt wants to know if he should discard of them when he returns or if the pens are still good.   Pt also needs a new pen sent to [place]. Pt needs a pharmacy that will deliver to his mother home. I informed patient to use google  to see which pharmacies deliver in the area and call us back with the information.

## 2018-06-14 NOTE — Telephone Encounter (Signed)
Called to clarify OK to use Victoza pens.  Patient asknowledged recieveing voice mail from previous day.  Plan for f/u in Rx clinic is 6 days was reviewed.  Patient verbalized he was doing well.   No change in therapy.

## 2018-06-18 ENCOUNTER — Telehealth: Payer: Self-pay

## 2018-06-18 DIAGNOSIS — M545 Low back pain: Principal | ICD-10-CM

## 2018-06-18 DIAGNOSIS — G8929 Other chronic pain: Secondary | ICD-10-CM

## 2018-06-18 DIAGNOSIS — T434X5A Adverse effect of butyrophenone and thiothixene neuroleptics, initial encounter: Secondary | ICD-10-CM

## 2018-06-18 NOTE — Telephone Encounter (Signed)
Patient called and wants a prescription for a quad cane. He is having balance issues due to his psych meds. Sees psych MD later this month.  Concerned about getting one that is long enough and wonders if it needs to be "fitted" by PT or an orthopedist. Made appt next week with you in case you need to see.  Call back is [phone]  Dylan Specter, RN [facility] RN)

## 2018-06-19 ENCOUNTER — Ambulatory Visit: Payer: Medicaid Other | Admitting: Podiatry

## 2018-06-19 DIAGNOSIS — B351 Tinea unguium: Secondary | ICD-10-CM | POA: Diagnosis not present

## 2018-06-19 DIAGNOSIS — M79676 Pain in unspecified toe(s): Secondary | ICD-10-CM | POA: Diagnosis not present

## 2018-06-19 DIAGNOSIS — L84 Corns and callosities: Secondary | ICD-10-CM

## 2018-06-19 DIAGNOSIS — E1142 Type 2 diabetes mellitus with diabetic polyneuropathy: Secondary | ICD-10-CM

## 2018-06-19 DIAGNOSIS — M79609 Pain in unspecified limb: Principal | ICD-10-CM

## 2018-06-19 NOTE — Patient Instructions (Signed)

## 2018-06-19 NOTE — Telephone Encounter (Signed)
  I put a DME order in for cane, I don't think he needs to see me at all for this, but unsure how home health company does cane fitting.   Dolores PattyAngela Rikayla Demmon, DO PGY-3, [facility] 06/19/2018 9:47 AM

## 2018-06-20 ENCOUNTER — Ambulatory Visit: Payer: Medicaid Other | Admitting: Pharmacist

## 2018-06-20 NOTE — Telephone Encounter (Signed)
Community message sent to [facility]. Bellah Alia, RN ([facility] RN)  

## 2018-06-23 ENCOUNTER — Encounter: Payer: Self-pay | Admitting: Podiatry

## 2018-06-23 NOTE — Progress Notes (Signed)
Subjective: Dylan Jordan presents with diabetes, diabetic neuropathy and cc of painful, discolored, thick toenails and painful corn left 5th digit which interferes with activities of daily living. Pain is aggravated when wearing enclosed shoe gear. Pain is relieved with periodic professional debridement.  Dylan Rattler, DO is his PCP.   Dylan Jordan would like to be seen every 5 weeks stating he is diabetic.   Current Outpatient Medications:  .  ACCU-CHEK SOFTCLIX LANCETS lancets, use as directed *may use THREE TIMES DAILY to check blood sugar*, Disp: 100 each, Rfl: 11 .  acetaminophen (TYLENOL) 650 MG CR tablet, Take 650 mg by mouth 2 (two) times daily as needed for pain. , Disp: , Rfl:  .  albuterol (PROVENTIL HFA;VENTOLIN HFA) 108 (90 Base) MCG/ACT inhaler, Inhale 1-2 puffs into the lungs every 6 (six) hours as needed for wheezing or shortness of breath., Disp: 1 Inhaler, Rfl: 2 .  allopurinol (ZYLOPRIM) 100 MG tablet, Take 2 tablets (200 mg total) by mouth daily., Disp: 180 tablet, Rfl: 3 .  atorvastatin (LIPITOR) 20 MG tablet, Take 1 tablet (20 mg total) by mouth daily., Disp: 90 tablet, Rfl: 3 .  benztropine (COGENTIN) 2 MG tablet, Take 2 mg by mouth 2 (two) times daily., Disp: , Rfl:  .  Blood Glucose Monitoring Suppl (ACCU-CHEK AVIVA PLUS) w/Device KIT, Use to check blood sugar three times daily or as directed., Disp: 1 kit, Rfl: 0 .  Colchicine (MITIGARE) 0.6 MG CAPS, Take 0.6 mg by mouth daily. Take 2 tablets once, then take 1 tablet one hour later., Disp: 14 capsule, Rfl: 1 .  divalproex (DEPAKOTE) 500 MG DR tablet, Take 500-2,000 mg by mouth See admin instructions. Take 511m every morning and 2000 tabs every evening, Disp: , Rfl:  .  ferrous sulfate (FERROUSUL) 325 (65 FE) MG tablet, Take 1 tablet (325 mg total) by mouth daily with breakfast., Disp: 90 tablet, Rfl: 3 .  fluticasone (FLOVENT HFA) 110 MCG/ACT inhaler, Inhale 2 puffs into the lungs 2 (two) times daily., Disp: 1  Inhaler, Rfl: 5 .  gabapentin (NEURONTIN) 300 MG capsule, Take 1 capsule (300 mg total) by mouth 3 (three) times daily as needed., Disp: 90 capsule, Rfl: 1 .  glimepiride (AMARYL) 2 MG tablet, Take 1 tablet (2 mg total) by mouth daily with breakfast., Disp: 30 tablet, Rfl: 2 .  glucose blood (ACCU-CHEK GUIDE) test strip, Use to check blood sugar three times daily or as directed., Disp: 100 each, Rfl: 12 .  haloperidol (HALDOL) 5 MG tablet, Take 5 mg by mouth at bedtime., Disp: , Rfl:  .  haloperidol decanoate (HALDOL DECANOATE) 100 MG/ML injection, Inject 400 mg into the muscle every 28 (twenty-eight) days., Disp: , Rfl:  .  Insulin Pen Needle (PEN NEEDLES) 32G X 5 MM MISC, 1 pen by Does not apply route daily., Disp: 100 each, Rfl: 2 .  liraglutide (VICTOZA) 18 MG/3ML SOPN, Inject 0.3 mLs (1.8 mg total) into the skin daily., Disp: 3 pen, Rfl: 2 .  lithium carbonate 300 MG capsule, Take 300 mg by mouth 2 (two) times daily with a meal., Disp: , Rfl:  .  losartan (COZAAR) 25 MG tablet, Take 1 tablet (25 mg total) by mouth daily., Disp: 90 tablet, Rfl: 2 .  metFORMIN (GLUCOPHAGE-XR) 750 MG 24 hr tablet, Take 1 tablet (750 mg total) by mouth daily with breakfast., Disp: 30 tablet, Rfl: 0 .  Multiple Vitamin (DAILY-VITE) TABS, TAKE 1 TABLET BY MOUTH DAILY., Disp: 30 tablet, Rfl: 11 .  NOVOFINE PLUS 32G X 4 MM MISC, 1 Container by Does not apply route daily., Disp: , Rfl:  .  omeprazole (PRILOSEC) 20 MG capsule, Take 2 capsules (40 mg total) by mouth every evening., Disp: 60 capsule, Rfl: 2 .  tamsulosin (FLOMAX) 0.4 MG CAPS capsule, Take 0.4 mg by mouth daily., Disp: , Rfl: 11  Allergies  Allergen Reactions  . Chantix [Varenicline Tartrate] Other (See Comments)    Nightmares  . Ativan [Lorazepam] Other (See Comments)    Agitation (and "5 others like it") Will need to call [facility]. [facility] in [place] (phone) [phone] and [phone] to fax a release-   . Penicillins Diarrhea    Has  patient had a PCN reaction causing immediate rash, facial/tongue/throat swelling, SOB or lightheadedness with hypotension: No Has patient had a PCN reaction causing severe rash involving mucus membranes or skin necrosis: No Has patient had a PCN reaction that required hospitalization: No Has patient had a PCN reaction occurring within the last 10 years: No If all of the above answers are "NO", then may proceed with Cephalosporin use.     Vascular Examination: Capillary refill time immediate  x 10 digits Dorsalis pedis and Posterior tibial pulses present b/l Sparse digital hair x 10 digits Skin temperature gradient WNL b/l  Dermatological Examination: Skin with normal turgor, texture and tone b/l  Toenails 1-5 b/l discolored, thick, dystrophic with subungual debris and pain with palpation to nailbeds due to thickness of nails.  Hyperkeratotic lesion dorsal PIPJ left 5th digit. No erythema, no edema, no drainage, no warmth, no flocculence.  Musculoskeletal: Muscle strength 5/5 to all LE muscle groups HAV with bunion deformity b/l Hammertoe 5th digit b/l  Neurological: Sensation intact with 10 gram monofilament b/l. Vibratory sensation intact b/l.   Assessment: 1. Painful onychomycosis toenails 1-5 b/l 2. Corn left 5th digit  3. NIDDM with Diabetic neuropathy  Plan: 1. Continue diabetic foot care principles. Literature dispensed. 2. We discussed his frequency of visits. I do not feel he needs to be seen every 5 weeks. He has no preulcerative lesions. I have asked his to change shoe gear to [employer], series 600 or higher. He is currently wearing [employer] [place] sneakers which have a low toe box and may be contributing to his corn formation. We will check his benefits to see if he qualifies for diabetic shoes. In the meantime, he is to be scheduled for 10 weeks. 3. Toenails 1-5 b/l were debrided in length and girth without iatrogenic bleeding. 4. Hyperkeratotic lesion pared with  sterile chisel blade left 5th digit. 5. Patient to continue soft, supportive shoe gear. 6. Patient to report any pedal injuries to medical professional. 7. Follow up 10 weeks. 8. Patient/POA to call should there be a concern in the interim.

## 2018-06-25 ENCOUNTER — Encounter (HOSPITAL_COMMUNITY): Payer: Self-pay

## 2018-06-25 ENCOUNTER — Other Ambulatory Visit: Payer: Self-pay

## 2018-06-25 ENCOUNTER — Emergency Department (HOSPITAL_COMMUNITY): Payer: Medicaid Other

## 2018-06-25 ENCOUNTER — Emergency Department (HOSPITAL_COMMUNITY)
Admission: EM | Admit: 2018-06-25 | Discharge: 2018-06-25 | Disposition: A | Discharge status: Home / Self Care | Payer: Medicaid Other | Attending: Emergency Medicine | Admitting: Emergency Medicine

## 2018-06-25 DIAGNOSIS — I129 Hypertensive chronic kidney disease with stage 1 through stage 4 chronic kidney disease, or unspecified chronic kidney disease: Secondary | ICD-10-CM | POA: Insufficient documentation

## 2018-06-25 DIAGNOSIS — Z79899 Other long term (current) drug therapy: Secondary | ICD-10-CM | POA: Diagnosis not present

## 2018-06-25 DIAGNOSIS — Z794 Long term (current) use of insulin: Secondary | ICD-10-CM | POA: Insufficient documentation

## 2018-06-25 DIAGNOSIS — Z96652 Presence of left artificial knee joint: Secondary | ICD-10-CM | POA: Diagnosis not present

## 2018-06-25 DIAGNOSIS — F17210 Nicotine dependence, cigarettes, uncomplicated: Secondary | ICD-10-CM | POA: Diagnosis not present

## 2018-06-25 DIAGNOSIS — E1122 Type 2 diabetes mellitus with diabetic chronic kidney disease: Secondary | ICD-10-CM | POA: Insufficient documentation

## 2018-06-25 DIAGNOSIS — J45909 Unspecified asthma, uncomplicated: Secondary | ICD-10-CM | POA: Insufficient documentation

## 2018-06-25 DIAGNOSIS — N189 Chronic kidney disease, unspecified: Secondary | ICD-10-CM | POA: Diagnosis not present

## 2018-06-25 DIAGNOSIS — M25562 Pain in left knee: Secondary | ICD-10-CM | POA: Diagnosis not present

## 2018-06-25 DIAGNOSIS — M79605 Pain in left leg: Secondary | ICD-10-CM

## 2018-06-25 LAB — COMPREHENSIVE METABOLIC PANEL
ALK PHOS: 56 U/L (ref 38–126)
ALT: 10 U/L (ref 0–44)
AST: 13 U/L — ABNORMAL LOW (ref 15–41)
Albumin: 3.2 g/dL — ABNORMAL LOW (ref 3.5–5.0)
Anion gap: 7 (ref 5–15)
BUN: 18 mg/dL (ref 8–23)
CO2: 24 mmol/L (ref 22–32)
Calcium: 9.1 mg/dL (ref 8.9–10.3)
Chloride: 104 mmol/L (ref 98–111)
Creatinine, Ser: 1.33 mg/dL — ABNORMAL HIGH (ref 0.61–1.24)
GFR calc non Af Amer: 57 mL/min — ABNORMAL LOW (ref >60)
Glucose, Bld: 307 mg/dL — ABNORMAL HIGH (ref 70–99)
Potassium: 4.2 mmol/L (ref 3.5–5.1)
Sodium: 135 mmol/L (ref 135–145)
Total Bilirubin: 0.4 mg/dL (ref 0.3–1.2)
Total Protein: 7 g/dL (ref 6.5–8.1)

## 2018-06-25 LAB — CBC WITH DIFFERENTIAL/PLATELET
Abs Immature Granulocytes: 0.05 10*3/uL (ref 0.00–0.07)
Basophils Absolute: 0 10*3/uL (ref 0.0–0.1)
Basophils Relative: 0 %
EOS PCT: 6 %
Eosinophils Absolute: 0.4 10*3/uL (ref 0.0–0.5)
HCT: 36.2 % — ABNORMAL LOW (ref 39.0–52.0)
Hemoglobin: 11 g/dL — ABNORMAL LOW (ref 13.0–17.0)
Immature Granulocytes: 1 %
Lymphocytes Relative: 19 %
Lymphs Abs: 1.4 10*3/uL (ref 0.7–4.0)
MCH: 28.9 pg (ref 26.0–34.0)
MCHC: 30.4 g/dL (ref 30.0–36.0)
MCV: 95.3 fL (ref 80.0–100.0)
MONOS PCT: 11 %
Monocytes Absolute: 0.8 10*3/uL (ref 0.1–1.0)
Neutro Abs: 4.9 10*3/uL (ref 1.7–7.7)
Neutrophils Relative %: 63 %
Platelets: 196 10*3/uL (ref 150–400)
RBC: 3.8 MIL/uL — ABNORMAL LOW (ref 4.22–5.81)
RDW: 13.2 % (ref 11.5–15.5)
WBC: 7.6 10*3/uL (ref 4.0–10.5)
nRBC: 0 % (ref 0.0–0.2)

## 2018-06-25 LAB — CBG MONITORING, ED: Glucose-Capillary: 369 mg/dL — ABNORMAL HIGH (ref 70–99)

## 2018-06-25 LAB — LIPASE, BLOOD: LIPASE: 44 U/L (ref 11–51)

## 2018-06-25 MED ORDER — ACETAMINOPHEN 325 MG PO TABS
650.0000 mg | ORAL_TABLET | Freq: Once | ORAL | Status: AC
  Administered 2018-06-25: 650 mg via ORAL
  Filled 2018-06-25: qty 2

## 2018-06-25 MED ORDER — SODIUM CHLORIDE 0.9 % IV BOLUS
1000.0000 mL | Freq: Once | INTRAVENOUS | Status: AC
  Administered 2018-06-25: 1000 mL via INTRAVENOUS

## 2018-06-25 MED ORDER — DICLOFENAC SODIUM 1 % TD GEL: 2.0000 g | Freq: Four times a day (QID) | TRANSDERMAL | 0 refills | Status: DC

## 2018-06-25 MED ORDER — IBUPROFEN 800 MG PO TABS
800.0000 mg | ORAL_TABLET | Freq: Once | ORAL | Status: AC
  Administered 2018-06-25: 800 mg via ORAL
  Filled 2018-06-25: qty 1

## 2018-06-25 MED ORDER — LIDOCAINE 5 % EX PTCH: 1.0000 | MEDICATED_PATCH | CUTANEOUS | 0 refills | Status: AC

## 2018-06-25 NOTE — ED Provider Notes (Signed)
[facility] DEPT Provider Note   CSN: [id number] Arrival date & time: 06/25/18  1312     History   Chief Complaint Chief Complaint  Patient presents with  . Near Syncope  . Leg Pain    HPI Dylan Jordan is a 63 y.o. male.  The history is provided by the patient.  Leg Pain  Location:  Knee Knee location:  L knee Pain details:    Quality:  Aching   Radiates to:  Does not radiate   Severity:  Mild   Onset quality:  Gradual   Timing:  Intermittent   Progression:  Waxing and waning Chronicity:  Chronic Dislocation: no   Prior injury to area:  Yes Relieved by:  Nothing Worsened by:  Nothing Associated symptoms: no back pain, no decreased ROM, no fatigue, no fever, no muscle weakness, no neck pain, no numbness, no stiffness, no swelling and no tingling     Past Medical History:  Diagnosis Date  . Anemia   . Asthma   . Diabetes mellitus without complication ([place])   . Gout   . Hyperlipidemia   . Hypertension     Patient Active Problem List   Diagnosis Date Noted  . Hyperlipidemia 12/18/2017  . 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 Intermittent chest pain 11/25/2017  . Allergic rhinitis with a nonallergic component 07/30/2017  . Food allergy 07/30/2017  . C. difficile diarrhea 05/22/2017  . Chronic gout involving toe of left foot without tophus 05/18/2017  . Hemorrhoids 05/04/2017  . Haloperidol adverse reaction 04/16/2017  . Chronic low back pain 01/03/2017  . Chronic cough 06/30/2016  . Drug-induced mood disorder ([place]) 06/14/2016  . Type 2 diabetes mellitus with diabetic neuropathy ([place]) 05/26/2016  . Iron deficiency anemia 05/26/2016  . Chronic kidney disease 05/26/2016  . HTN (hypertension) 05/26/2016  . Tobacco abuse 05/26/2016  . History of substance abuse ([place]) 05/26/2016  . History of alcohol abuse 05/26/2016  . Mild persistent asthma 05/26/2016    Past Surgical History:  Procedure Laterality Date   . REPLACEMENT TOTAL KNEE Left   . TOE SURGERY          Home Medications    Prior to Admission medications   Medication Sig Start Date End Date Taking? Authorizing Provider  acetaminophen (TYLENOL) 650 MG CR tablet Take 650 mg by mouth 2 (two) times daily as needed for pain.    Yes [provider]  allopurinol (ZYLOPRIM) 100 MG tablet Take 2 tablets (200 mg total) by mouth daily. 01/10/18  Yes Hensel, Jamal Collin, MD  atorvastatin (LIPITOR) 20 MG tablet Take 1 tablet (20 mg total) by mouth daily. 03/07/18  Yes [name], [name], DO  benztropine (COGENTIN) 2 MG tablet Take 2 mg by mouth 2 (two) times daily.   Yes [provider]  divalproex (DEPAKOTE) 500 MG DR tablet Take 500-2,000 mg by mouth See admin instructions. Take 562m every morning and 2000 tabs every evening   Yes [provider]  ferrous sulfate (FERROUSUL) 325 (65 FE) MG tablet Take 1 tablet (325 mg total) by mouth daily with breakfast. 03/06/18  Yes [name], [name], DO  fluticasone (FLOVENT HFA) 110 MCG/ACT inhaler Inhale 2 puffs into the lungs 2 (two) times daily. 07/30/17  Yes Bobbitt, RSedalia Muta MD  gabapentin (NEURONTIN) 300 MG capsule Take 1 capsule (300 mg total) by mouth 3 (three) times daily as needed. Patient taking differently: Take 300 mg by mouth 3 (three) times daily as  needed (nerve pain).  05/03/18  Yes [name], [name], DO  glimepiride (AMARYL) 4 MG tablet Take 4 mg by mouth daily. 02/17/18  Yes [provider]  haloperidol (HALDOL) 5 MG tablet Take 5 mg by mouth at bedtime.   Yes [provider]  haloperidol decanoate (HALDOL DECANOATE) 100 MG/ML injection Inject 400 mg into the muscle every 28 (twenty-eight) days.   Yes [provider]  ibuprofen (ADVIL,MOTRIN) 800 MG tablet Take 800 mg by mouth every 6 (six) hours as needed. for pain 05/29/18  Yes [provider]  liraglutide (VICTOZA) 18 MG/3ML SOPN Inject 0.3 mLs (1.8 mg total) into the skin daily.  04/09/18  Yes [name], Levada Dy C, DO  lithium carbonate 300 MG capsule Take 300 mg by mouth 2 (two) times daily with a meal.   Yes [provider]  losartan (COZAAR) 25 MG tablet Take 1 tablet (25 mg total) by mouth daily. 01/24/18  Yes Zenia Resides, MD  metFORMIN (GLUCOPHAGE-XR) 750 MG 24 hr tablet Take 1 tablet (750 mg total) by mouth daily with breakfast. 05/10/18  Yes Diallo, Abdoulaye, MD  Multiple Vitamin (DAILY-VITE) TABS TAKE 1 TABLET BY MOUTH DAILY. 10/03/16  Yes Sela Hilding, MD  omeprazole (PRILOSEC) 20 MG capsule Take 2 capsules (40 mg total) by mouth every evening. 05/16/18  Yes [name], DO  tamsulosin (FLOMAX) 0.4 MG CAPS capsule Take 0.4 mg by mouth daily. 03/28/18  Yes [provider]  ACCU-CHEK SOFTCLIX LANCETS lancets use as directed *may use THREE TIMES DAILY to check blood sugar* 06/28/17   [name], DO  albuterol (PROVENTIL HFA;VENTOLIN HFA) 108 (90 Base) MCG/ACT inhaler Inhale 1-2 puffs into the lungs every 6 (six) hours as needed for wheezing or shortness of breath. Patient not taking: Reported on 06/25/2018 03/25/18   Bobbitt, Sedalia Muta, MD  Blood Glucose Monitoring Suppl (ACCU-CHEK AVIVA PLUS) w/Device KIT Use to check blood sugar three times daily or as directed. 05/27/18   [name], DO  Colchicine (MITIGARE) 0.6 MG CAPS Take 0.6 mg by mouth daily. Take 2 tablets once, then take 1 tablet one hour later. Patient not taking: Reported on 06/25/2018 12/13/17   Marjie Skiff, MD  diclofenac sodium (VOLTAREN) 1 % GEL Apply 2 g topically 4 (four) times daily for 13 days. 06/25/18 07/08/18  Wacey Zieger, DO  glucose blood (ACCU-CHEK GUIDE) test strip Use to check blood sugar three times daily or as directed. 05/31/18   [name], DO  Insulin Pen Needle (PEN NEEDLES) 32G X 5 MM MISC 1 pen by Does not apply route daily. 01/24/18   Zenia Resides, MD  lidocaine (LIDODERM) 5 % Place 1 patch onto the skin daily for 30 doses.  Remove & Discard patch within 12 hours or as directed by MD 06/25/18 07/25/18  Genie Mirabal, DO  NOVOFINE PLUS 32G X 4 MM MISC 1 Container by Does not apply route daily. 05/18/18   [provider]    Family History Family History  Adopted: Yes  Problem Relation Age of Onset  . Allergic rhinitis Neg Hx   . Angioedema Neg Hx   . Asthma Neg Hx   . Atopy Neg Hx   . Eczema Neg Hx   . Immunodeficiency Neg Hx   . Urticaria Neg Hx     Social History Social History   Tobacco Use  . Smoking status: Current Some Day Smoker    Packs/day: 0.30    Years: 45.00  Pack years: 13.50    Types: Cigarettes    Start date: 05/16/1971  . Smokeless tobacco: Never Used  . Tobacco comment: Decreased Intake to 10 cigarettes per week or less.  Substance Use Topics  . Alcohol use: No  . Drug use: No     Allergies   Chantix [varenicline tartrate]; Ativan [lorazepam]; and Penicillins   Review of Systems Review of Systems  Constitutional: Negative for chills, fatigue and fever.  HENT: Negative for ear pain and sore throat.   Eyes: Negative for pain and visual disturbance.  Respiratory: Negative for cough and shortness of breath.   Cardiovascular: Negative for chest pain and palpitations.  Gastrointestinal: Negative for abdominal pain and vomiting.  Genitourinary: Negative for dysuria and hematuria.  Musculoskeletal: Positive for gait problem. Negative for arthralgias, back pain, neck pain and stiffness.  Skin: Negative for color change and rash.  Neurological: Negative for seizures and syncope.  All other systems reviewed and are negative.    Physical Exam Updated Vital Signs BP 121/67   Pulse 78   Temp 98.2 F (36.8 C) (Oral)   Resp 10   Ht '6\' 2"'  (1.88 m)   Wt 104.3 kg   SpO2 100%   BMI 29.53 kg/m   Physical Exam Vitals signs and nursing note reviewed.  Constitutional:      General: He is not in acute distress.    Appearance: He is well-developed. He is not  ill-appearing.  HENT:     Head: Normocephalic and atraumatic.  Eyes:     Extraocular Movements: Extraocular movements intact.     Conjunctiva/sclera: Conjunctivae normal.     Pupils: Pupils are equal, round, and reactive to light.  Neck:     Musculoskeletal: Normal range of motion and neck supple.  Cardiovascular:     Rate and Rhythm: Normal rate and regular rhythm.     Heart sounds: Normal heart sounds. No murmur.  Pulmonary:     Effort: Pulmonary effort is normal. No respiratory distress.     Breath sounds: Normal breath sounds.  Abdominal:     Palpations: Abdomen is soft.     Tenderness: There is no abdominal tenderness.  Musculoskeletal: Normal range of motion.        General: Tenderness present. No swelling, deformity or signs of injury.     Right lower leg: No edema.  Skin:    General: Skin is warm and dry.  Neurological:     General: No focal deficit present.     Mental Status: He is alert and oriented to person, place, and time.     Cranial Nerves: No cranial nerve deficit.     Sensory: No sensory deficit.     Motor: No weakness.     Coordination: Coordination normal.      ED Treatments / Results  Labs (all labs ordered are listed, but only abnormal results are displayed) Labs Reviewed  CBC WITH DIFFERENTIAL/PLATELET - Abnormal; Notable for the following components:      Result Value   RBC 3.80 (*)    Hemoglobin 11.0 (*)    HCT 36.2 (*)    All other components within normal limits  COMPREHENSIVE METABOLIC PANEL - Abnormal; Notable for the following components:   Glucose, Bld 307 (*)    Creatinine, Ser 1.33 (*)    Albumin 3.2 (*)    AST 13 (*)    GFR calc non Af Amer 57 (*)    All other components within normal limits  CBG MONITORING,  ED - Abnormal; Notable for the following components:   Glucose-Capillary 369 (*)    All other components within normal limits  LIPASE, BLOOD    EKG None  Radiology Dg Knee 2 Views Left  Result Date:  06/25/2018 CLINICAL DATA:  Pt c/o knee pain, no injury, seems confused. EXAM: LEFT KNEE - 1-2 VIEW COMPARISON:  None. FINDINGS: Status post LEFT knee arthroplasty. Hardware is intact. No dislocation. No significant joint effusion. No acute fracture. IMPRESSION: 1. No acute findings. 2. Hardware intact. Electronically Signed   By: Nolon Nations M.D.   On: 06/25/2018 16:04    Procedures Procedures (including critical care time)  Medications Ordered in ED Medications  sodium chloride 0.9 % bolus 1,000 mL (1,000 mLs Intravenous New Bag/Given (Non-Interop) 06/25/18 1656)  ibuprofen (ADVIL,MOTRIN) tablet 800 mg (800 mg Oral Given 06/25/18 1630)  acetaminophen (TYLENOL) tablet 650 mg (650 mg Oral Given 06/25/18 1629)     Initial Impression / Assessment and Plan / ED Course  I have reviewed the triage vital signs and the nursing notes.  Pertinent labs & imaging results that were available during my care of the patient were reviewed by me and considered in my medical decision making (see chart for details).     Dylan Jordan is a 63 year old male with history of diabetes, hypertension, high cholesterol who presents to the ED mostly for left knee pain which is chronic.  Patient with normal vitals.  No fever.  Patient on my initial evaluation is refusing to give me any history.  He is upset about his pain and wants narcotics.  He was verbally abusive towards me and overall was difficult to assess initially.  His left lower leg has no significant findings.  There is no swelling, normal range of motion, some mild tenderness.  He previously has a knee replacement in this left knee.  Has a history of narcotic abuse.  Has not been taking any over-the-counter medications for this pain.  He does not give me any history of about being lightheaded or dizzy or having near syncope although that is in his triage note. Denies chest pain, SOB, neuro symptoms. Only states to me left knee pain. He did have a elevated  blood sugar but otherwise lab work was unremarkable.  No significant anemia.  No significant electrolyte abnormality.  Creatinine is at baseline.  He was given IV fluids, Tylenol, Motrin and crutches for his left knee.  X-ray did not show any malalignment or effusion.  No concern for new left knee process.  Recommend that he follow-up with his primary care doctor for chronic pain management.  Was given a prescription for Voltaren/lidocaine patch and encouraged to fill one or the other if he is able to afford it.  Recommend that he continue to use Tylenol and was discharged in ED in good condition.  This chart was dictated using voice recognition software.  Despite best efforts to proofread,  errors can occur which can change the documentation meaning.    Final Clinical Impressions(s) / ED Diagnoses   Final diagnoses:  Left leg pain    ED Discharge Orders         Ordered    lidocaine (LIDODERM) 5 %  Every 24 hours     06/25/18 1742    diclofenac sodium (VOLTAREN) 1 % GEL  4 times daily     06/25/18 1742           Lennice Sites, DO 06/25/18 1746

## 2018-06-25 NOTE — ED Notes (Signed)
Bed: WA11 Expected date:  Expected time:  Means of arrival:  Comments: EMS-syncope 

## 2018-06-25 NOTE — ED Triage Notes (Signed)
Per EMS: [facility] from [facility].  Pt was with Child psychotherapist there.  Social worker said he stood up and looked like he was going to pass out. She gave pt a coke.  Pt has been having presyncopal feelings for a month now.  Seated BP was 84/42, laying was 96/60.  Pt c/o of left leg pain since knee surgery, knee is warm to touch and swollen.  Pt speech is slurred, which is normal for pt.

## 2018-06-26 ENCOUNTER — Ambulatory Visit: Payer: Self-pay | Admitting: Family Medicine

## 2018-06-28 ENCOUNTER — Ambulatory Visit: Payer: Medicaid Other | Admitting: Family Medicine

## 2018-07-01 ENCOUNTER — Ambulatory Visit: Payer: Medicaid Other | Admitting: Pharmacist

## 2018-07-01 ENCOUNTER — Encounter: Payer: Self-pay | Admitting: Pharmacist

## 2018-07-01 DIAGNOSIS — E1140 Type 2 diabetes mellitus with diabetic neuropathy, unspecified: Secondary | ICD-10-CM | POA: Diagnosis not present

## 2018-07-01 MED ORDER — INSULIN GLARGINE 100 UNIT/ML SOLOSTAR PEN: 10.0000 [IU] | PEN_INJECTOR | Freq: Every day | SUBCUTANEOUS | 0 refills | Status: DC

## 2018-07-01 NOTE — Patient Instructions (Signed)
It was nice to see you today!  STOP taking metformin today.  CONTINUE glimepiride 4mg  daily and Victoza 1.8mg  daily  START Lantus (insulin glargine) 10 units once daily.   We will call you next Friday to talk about how your blood sugar has been. Please keep a blood sugar log.  Please call the clinic with any questions or concerns you have.

## 2018-07-01 NOTE — Assessment & Plan Note (Signed)
Longstanding history of diabetes, currently uncontrolled per patient report of BG readings 200-399. No hypoglycemic episodes, but patient is able to verbalize appropriate hypoglycemia management plan. Patient is adherent with medications. Control is suboptimal due to inactivity and dietary indiscretions; he drinks a large amount of regular soda each day. -Started basal insulin Lantus (insulin glargine) 10 units every morning.  -Educated on administration technique, patient appropriately self-administered Lantus 10 units in the office -Continued Victoza (liraglutide) to 1.8mg  daily every morning.   -Continued glimepiride 4mg  daily -Stopped metformin XR 750mg  daily -Recommended lifestyle interventions, dietary effects on glycemic control. -Counseled on s/sx of and management of hypoglycemia -Next A1C anticipated 07/2018.  -Patient resistant to taking Victoza and Lantus in the morning compared to at night, but willing to try.  -Will call patient next week to address BG after initiation of Lantus ([phone])

## 2018-07-01 NOTE — Progress Notes (Signed)
S:    Patient arrives in good spirits, but complaining of uncontrolled knee pain. Presents for hypertension and diabetes evaluation, education, and management. Patient was last seen by Dr. Wonda Olds on 05/13/2018.  Last seen by Pharmacy clinic on 05/30/2018. Patient reports Diabetes diagnosed in 05/2016. He forgot to bring his medication bag today as his cab came early and he was unprepared.  Patient reports his diabetes has been uncontrolled. He does report adherence with medications. BG has been in the 300's. Reports he never sees BG <200. He notes his appetite has not been as strong lately because he is trying to learn what foods to cook. He reports taking metformin 750mg  BID in the past but decreased due to diarrhea; no adverse effects at current dose of 750mg  daily. Has a planned CT scan 2/18, where he will be holding metformin from 2/18-2/20, with plans to restart metformin 2/21.   Patient denies hypoglycemic events. He has experienced hypoglycemia a long time ago when he was on insulin and is very aware of symptoms (lethargy, hunger, dizziness). Understands appropriate actions to take when he gets hypoglycemic. He reports frequent nocturia and polydipsia. He is self conscious about how often he urinates with flomax. We discussed that his nighttime urination would likely improve with improved BG control.  Patient reported dietary habits: Eats 3 meals/day.  -Trying to drink more diet soda but still drinks lots of regular soda every day.  -No current exercise with uncontrolled knee pain.  Current diabetes medications include: Glimepiride 4mg  daily, Victoza 1.8mg  daily, metformin XR 750mg  daily Current hypertension medications include: losartan 25mg  daily  O:  Physical Exam Constitutional:      Appearance: Normal appearance.  Neurological:     Mental Status: He is alert.    Review of Systems  Musculoskeletal:       Knee Pain  Psychiatric/Behavioral:       Scattered thoughts, difficult to  redirect  All other systems reviewed and are negative.   Lab Results  Component Value Date   HGBA1C 7.5 (A) 04/26/2018    Lipid Panel     Component Value Date/Time   CHOL 187 03/18/2018 1127   TRIG 352 (H) 03/18/2018 1127   HDL 40 03/18/2018 1127   CHOLHDL 4.7 03/18/2018 1127   LDLCALC 77 03/18/2018 1127   Did not bring in BG monitor Patient reported home CBG: 200-399 Patient reports no hypoglycemia events  Clinical ASCVD: No  The 10-year ASCVD risk score Denman George DC [name], et al., 2013) is: 49.7%   Values used to calculate the score:     Age: 67 years     Sex: Male     Is Non-Hispanic African American: Yes     Diabetic: Yes     Tobacco smoker: Yes     Systolic Blood Pressure: 145 mmHg     Is BP treated: Yes     HDL Cholesterol: 40 mg/dL     Total Cholesterol: 187 mg/dL   A/P: 1. Diabetes Longstanding history of diabetes, currently uncontrolled per patient report of BG readings 200-399. No hypoglycemic episodes, but patient is able to verbalize appropriate hypoglycemia management plan. Patient is adherent with medications. Control is suboptimal due to inactivity and dietary indiscretions; he drinks a large amount of regular soda each day. -Started basal insulin Lantus (insulin glargine) 10 units every morning.  -Educated on administration technique, patient appropriately self-administered Lantus 10 units in the office -Continued Victoza (liraglutide) to 1.8mg  daily every morning.   -Continued glimepiride 4mg  daily -  Stopped metformin XR 750mg  daily -Recommended lifestyle interventions, dietary effects on glycemic control. -Counseled on s/sx of and management of hypoglycemia -Next A1C anticipated 07/2018.  -Patient resistant to taking Victoza and Lantus in the morning compared to at night, but willing to try.  -Will call patient next week to address BG after initiation of Lantus ([phone])  2. Hyperlipidemia ASCVD risk - primary prevention in patient with DM. Last LDL is  controlled at 77 (82/5053). ASCVD risk score is >20%  - high intensity statin indicated.  -Continued atorvastatin 20mg .  -Could consider escalating to high intensity statin at next visit.  Written patient instructions provided.  Total time in face to face counseling 60 minutes.   Follow up phone call in 1 week, possibly will schedule a PharmD clinic visit in 2 weeks.  Patient seen with Wendelyn Breslow, PharmD, PGY-1 resident.

## 2018-07-01 NOTE — Progress Notes (Signed)
Patient ID: Dylan Jordan, male   DOB: Oct 09, 1955, 63 y.o.   MRN: [id number] Reviewed: I agree with the documentation and management of Dr. Raymondo Band.

## 2018-07-02 ENCOUNTER — Telehealth: Payer: Self-pay

## 2018-07-02 NOTE — Telephone Encounter (Signed)
Patient called for Dr. Raymondo Band with questions regarding all his diabetic medications and dosages.  [phone]  Ples Specter, RN [facility] RN)

## 2018-07-03 ENCOUNTER — Telehealth: Payer: Self-pay | Admitting: Family Medicine

## 2018-07-03 ENCOUNTER — Other Ambulatory Visit: Payer: Self-pay | Admitting: Family Medicine

## 2018-07-03 DIAGNOSIS — E1140 Type 2 diabetes mellitus with diabetic neuropathy, unspecified: Secondary | ICD-10-CM

## 2018-07-03 MED ORDER — INSULIN GLARGINE 100 UNIT/ML SOLOSTAR PEN: 10.0000 [IU] | PEN_INJECTOR | Freq: Every day | SUBCUTANEOUS | 0 refills | Status: DC

## 2018-07-03 NOTE — Telephone Encounter (Signed)
Pt called again. Read dosage (from AVS) and advised to look at the sheet Dr. Raymondo Band gave him.  Pt appreciative. Ida Milbrath, Maryjo Rochester, CMA   REMINDED of appt for Friday. Jeffrie Lofstrom, Maryjo Rochester, CMA

## 2018-07-03 NOTE — Telephone Encounter (Signed)
**  After Hours/ Emergency Line Call**  Received a call to report that Parthenia Ames his lantus pen broke. States "I do not think this is an emergency". Patient states that he saw his doctor who recently changed RX to Victoza and Lantus. Patient states this evening when trying to take lantus he noticed his needle was broken and he could not take his lantus because pen was broken. Took victoza this morning. Patient only has one pen so unable to try and use another. Offered to send in RX to a 24hr pharmacy but patient stated that he is unable to drive and does not have anyone that could pick up RX for him. Instructed patient to check blood sugars. If they are very elevated will need to be seen in ED to avoid HHS or DKA. Endorsing no symptoms.  States he feels at baseline.  Recommended that patient go to 24hr pharmacy but patient unable to get a ride to pharmacy. Advised to check his CBG and go to ED if elevated sugars as he is unable to give himself insulin at this time. Also advised to check CBG in AM, if elevated will need to call clinic and be seen that day. RX for new lantus pen sent to desired pharmacy.  Red flags discussed.  Strict return precautions given. Will forward to PCP as well as Dr. Raymondo Band who is seeing patient for diabetes.   Oralia Manis, DO PGY-2, [facility] 07/03/2018 8:19 PM

## 2018-07-04 ENCOUNTER — Telehealth: Payer: Self-pay | Admitting: Pharmacist

## 2018-07-04 ENCOUNTER — Encounter (HOSPITAL_COMMUNITY): Payer: Self-pay | Admitting: Family Medicine

## 2018-07-04 ENCOUNTER — Inpatient Hospital Stay (HOSPITAL_COMMUNITY)
Admission: EM | Admit: 2018-07-04 | Discharge: 2018-07-10 | DRG: 638 | Disposition: A | Discharge status: Home Health | Payer: Medicaid Other | Attending: Family Medicine | Admitting: Family Medicine

## 2018-07-04 ENCOUNTER — Emergency Department (HOSPITAL_COMMUNITY): Payer: Medicaid Other

## 2018-07-04 ENCOUNTER — Other Ambulatory Visit: Payer: Self-pay

## 2018-07-04 DIAGNOSIS — E119 Type 2 diabetes mellitus without complications: Secondary | ICD-10-CM | POA: Diagnosis present

## 2018-07-04 DIAGNOSIS — H9190 Unspecified hearing loss, unspecified ear: Secondary | ICD-10-CM | POA: Diagnosis present

## 2018-07-04 DIAGNOSIS — M545 Low back pain: Secondary | ICD-10-CM | POA: Diagnosis present

## 2018-07-04 DIAGNOSIS — G8929 Other chronic pain: Secondary | ICD-10-CM | POA: Diagnosis present

## 2018-07-04 DIAGNOSIS — E7800 Pure hypercholesterolemia, unspecified: Secondary | ICD-10-CM | POA: Diagnosis not present

## 2018-07-04 DIAGNOSIS — E1122 Type 2 diabetes mellitus with diabetic chronic kidney disease: Secondary | ICD-10-CM | POA: Diagnosis present

## 2018-07-04 DIAGNOSIS — N3946 Mixed incontinence: Secondary | ICD-10-CM | POA: Diagnosis present

## 2018-07-04 DIAGNOSIS — N179 Acute kidney failure, unspecified: Secondary | ICD-10-CM | POA: Diagnosis present

## 2018-07-04 DIAGNOSIS — J4530 Mild persistent asthma, uncomplicated: Secondary | ICD-10-CM | POA: Diagnosis present

## 2018-07-04 DIAGNOSIS — Z794 Long term (current) use of insulin: Secondary | ICD-10-CM

## 2018-07-04 DIAGNOSIS — Z880 Allergy status to penicillin: Secondary | ICD-10-CM

## 2018-07-04 DIAGNOSIS — I129 Hypertensive chronic kidney disease with stage 1 through stage 4 chronic kidney disease, or unspecified chronic kidney disease: Secondary | ICD-10-CM | POA: Diagnosis present

## 2018-07-04 DIAGNOSIS — E785 Hyperlipidemia, unspecified: Secondary | ICD-10-CM | POA: Diagnosis present

## 2018-07-04 DIAGNOSIS — Z9114 Patient's other noncompliance with medication regimen: Secondary | ICD-10-CM

## 2018-07-04 DIAGNOSIS — F17210 Nicotine dependence, cigarettes, uncomplicated: Secondary | ICD-10-CM | POA: Diagnosis present

## 2018-07-04 DIAGNOSIS — R338 Other retention of urine: Secondary | ICD-10-CM | POA: Diagnosis present

## 2018-07-04 DIAGNOSIS — E871 Hypo-osmolality and hyponatremia: Secondary | ICD-10-CM | POA: Diagnosis present

## 2018-07-04 DIAGNOSIS — N189 Chronic kidney disease, unspecified: Secondary | ICD-10-CM | POA: Diagnosis present

## 2018-07-04 DIAGNOSIS — I10 Essential (primary) hypertension: Secondary | ICD-10-CM | POA: Diagnosis not present

## 2018-07-04 DIAGNOSIS — E1101 Type 2 diabetes mellitus with hyperosmolarity with coma: Principal | ICD-10-CM | POA: Diagnosis present

## 2018-07-04 DIAGNOSIS — D509 Iron deficiency anemia, unspecified: Secondary | ICD-10-CM | POA: Diagnosis present

## 2018-07-04 DIAGNOSIS — M1A0720 Idiopathic chronic gout, left ankle and foot, without tophus (tophi): Secondary | ICD-10-CM | POA: Diagnosis present

## 2018-07-04 DIAGNOSIS — Z9111 Patient's noncompliance with dietary regimen: Secondary | ICD-10-CM | POA: Diagnosis not present

## 2018-07-04 DIAGNOSIS — Z9181 History of falling: Secondary | ICD-10-CM

## 2018-07-04 DIAGNOSIS — E875 Hyperkalemia: Secondary | ICD-10-CM | POA: Diagnosis present

## 2018-07-04 DIAGNOSIS — F209 Schizophrenia, unspecified: Secondary | ICD-10-CM | POA: Diagnosis present

## 2018-07-04 DIAGNOSIS — F200 Paranoid schizophrenia: Secondary | ICD-10-CM | POA: Diagnosis present

## 2018-07-04 DIAGNOSIS — R739 Hyperglycemia, unspecified: Secondary | ICD-10-CM | POA: Diagnosis present

## 2018-07-04 DIAGNOSIS — N401 Enlarged prostate with lower urinary tract symptoms: Secondary | ICD-10-CM | POA: Diagnosis present

## 2018-07-04 DIAGNOSIS — K219 Gastro-esophageal reflux disease without esophagitis: Secondary | ICD-10-CM | POA: Diagnosis present

## 2018-07-04 DIAGNOSIS — Z79899 Other long term (current) drug therapy: Secondary | ICD-10-CM

## 2018-07-04 DIAGNOSIS — Z888 Allergy status to other drugs, medicaments and biological substances status: Secondary | ICD-10-CM

## 2018-07-04 DIAGNOSIS — D649 Anemia, unspecified: Secondary | ICD-10-CM | POA: Diagnosis not present

## 2018-07-04 DIAGNOSIS — E1140 Type 2 diabetes mellitus with diabetic neuropathy, unspecified: Secondary | ICD-10-CM | POA: Diagnosis present

## 2018-07-04 DIAGNOSIS — Z7951 Long term (current) use of inhaled steroids: Secondary | ICD-10-CM

## 2018-07-04 DIAGNOSIS — N183 Chronic kidney disease, stage 3 (moderate): Secondary | ICD-10-CM | POA: Diagnosis not present

## 2018-07-04 HISTORY — DX: Schizophrenia, unspecified: F20.9

## 2018-07-04 LAB — POCT I-STAT EG7
Acid-base deficit: 6 mmol/L — ABNORMAL HIGH (ref 0.0–2.0)
Bicarbonate: 20.9 mmol/L (ref 20.0–28.0)
Calcium, Ion: 1.24 mmol/L (ref 1.15–1.40)
HCT: 31 % — ABNORMAL LOW (ref 39.0–52.0)
Hemoglobin: 10.5 g/dL — ABNORMAL LOW (ref 13.0–17.0)
O2 Saturation: 45 %
Potassium: 6.3 mmol/L (ref 3.5–5.1)
Sodium: 128 mmol/L — ABNORMAL LOW (ref 135–145)
TCO2: 22 mmol/L (ref 22–32)
pCO2, Ven: 45 mmHg (ref 44.0–60.0)
pH, Ven: 7.275 (ref 7.250–7.430)
pO2, Ven: 28 mmHg — CL (ref 32.0–45.0)

## 2018-07-04 LAB — BASIC METABOLIC PANEL
Anion gap: 7 (ref 5–15)
BUN: 13 mg/dL (ref 8–23)
CO2: 21 mmol/L — ABNORMAL LOW (ref 22–32)
CREATININE: 1.99 mg/dL — AB (ref 0.61–1.24)
Calcium: 8.7 mg/dL — ABNORMAL LOW (ref 8.9–10.3)
Chloride: 97 mmol/L — ABNORMAL LOW (ref 98–111)
GFR calc Af Amer: 41 mL/min — ABNORMAL LOW (ref >60)
GFR calc non Af Amer: 35 mL/min — ABNORMAL LOW (ref >60)
Glucose, Bld: 767 mg/dL (ref 70–99)
Potassium: 6.1 mmol/L — ABNORMAL HIGH (ref 3.5–5.1)
Sodium: 125 mmol/L — ABNORMAL LOW (ref 135–145)

## 2018-07-04 LAB — URINALYSIS, ROUTINE W REFLEX MICROSCOPIC
Bilirubin Urine: NEGATIVE
Glucose, UA: >=500 mg/dL — AB
Hgb urine dipstick: NEGATIVE
Ketones, ur: NEGATIVE mg/dL
Leukocytes,Ua: NEGATIVE
Nitrite: NEGATIVE
Protein, ur: NEGATIVE mg/dL
Specific Gravity, Urine: 1.012 (ref 1.005–1.030)
pH: 6 (ref 5.0–8.0)

## 2018-07-04 LAB — CBC WITH DIFFERENTIAL/PLATELET
ABS IMMATURE GRANULOCYTES: 0.06 10*3/uL (ref 0.00–0.07)
Basophils Absolute: 0 10*3/uL (ref 0.0–0.1)
Basophils Relative: 1 %
Eosinophils Absolute: 0.5 10*3/uL (ref 0.0–0.5)
Eosinophils Relative: 7 %
HCT: 32 % — ABNORMAL LOW (ref 39.0–52.0)
HEMOGLOBIN: 9.5 g/dL — AB (ref 13.0–17.0)
Immature Granulocytes: 1 %
Lymphocytes Relative: 28 %
Lymphs Abs: 1.9 10*3/uL (ref 0.7–4.0)
MCH: 28.1 pg (ref 26.0–34.0)
MCHC: 29.7 g/dL — ABNORMAL LOW (ref 30.0–36.0)
MCV: 94.7 fL (ref 80.0–100.0)
MONOS PCT: 8 %
Monocytes Absolute: 0.6 10*3/uL (ref 0.1–1.0)
NEUTROS ABS: 4 10*3/uL (ref 1.7–7.7)
Neutrophils Relative %: 55 %
Platelets: 247 10*3/uL (ref 150–400)
RBC: 3.38 MIL/uL — ABNORMAL LOW (ref 4.22–5.81)
RDW: 13.1 % (ref 11.5–15.5)
WBC: 7 10*3/uL (ref 4.0–10.5)
nRBC: 0 % (ref 0.0–0.2)

## 2018-07-04 LAB — ETHANOL

## 2018-07-04 LAB — CBG MONITORING, ED
Glucose-Capillary: >600 mg/dL (ref 70–99)
Glucose-Capillary: >600 mg/dL (ref 70–99)
Glucose-Capillary: 501 mg/dL (ref 70–99)
Glucose-Capillary: 397 mg/dL — ABNORMAL HIGH (ref 70–99)
Glucose-Capillary: 330 mg/dL — ABNORMAL HIGH (ref 70–99)
Glucose-Capillary: 236 mg/dL — ABNORMAL HIGH (ref 70–99)

## 2018-07-04 MED ORDER — INSULIN REGULAR(HUMAN) IN NACL 100-0.9 UT/100ML-% IV SOLN
INTRAVENOUS | Status: DC
  Administered 2018-07-04: 5.4 [IU]/h via INTRAVENOUS
  Filled 2018-07-04: qty 100

## 2018-07-04 MED ORDER — INSULIN REGULAR(HUMAN) IN NACL 100-0.9 UT/100ML-% IV SOLN
INTRAVENOUS | Status: DC
  Administered 2018-07-05: 2 [IU]/h via INTRAVENOUS

## 2018-07-04 MED ORDER — SODIUM CHLORIDE 0.9 % IV SOLN: INTRAVENOUS | Status: DC

## 2018-07-04 MED ORDER — LITHIUM CARBONATE 300 MG PO CAPS
300.0000 mg | ORAL_CAPSULE | Freq: Two times a day (BID) | ORAL | Status: DC
  Administered 2018-07-05 – 2018-07-10 (×11): 300 mg via ORAL
  Filled 2018-07-04 (×12): qty 1

## 2018-07-04 MED ORDER — SODIUM CHLORIDE 0.9 % IV SOLN
INTRAVENOUS | Status: DC
  Administered 2018-07-04: 125 mL/h via INTRAVENOUS

## 2018-07-04 MED ORDER — GABAPENTIN 300 MG PO CAPS
300.0000 mg | ORAL_CAPSULE | Freq: Three times a day (TID) | ORAL | Status: DC | PRN
  Administered 2018-07-07 – 2018-07-10 (×3): 300 mg via ORAL
  Filled 2018-07-04 (×3): qty 1

## 2018-07-04 MED ORDER — ATORVASTATIN CALCIUM 10 MG PO TABS: 20.0000 mg | ORAL_TABLET | Freq: Every day | ORAL | Status: DC

## 2018-07-04 MED ORDER — SODIUM CHLORIDE 0.9 % IV BOLUS
1000.0000 mL | Freq: Once | INTRAVENOUS | Status: AC
  Administered 2018-07-04: 1000 mL via INTRAVENOUS

## 2018-07-04 MED ORDER — TAMSULOSIN HCL 0.4 MG PO CAPS
0.4000 mg | ORAL_CAPSULE | Freq: Every day | ORAL | Status: DC
  Administered 2018-07-05 – 2018-07-08 (×4): 0.4 mg via ORAL
  Filled 2018-07-04 (×4): qty 1

## 2018-07-04 MED ORDER — ENOXAPARIN SODIUM 40 MG/0.4ML ~~LOC~~ SOLN
40.0000 mg | SUBCUTANEOUS | Status: DC
  Administered 2018-07-05 – 2018-07-09 (×5): 40 mg via SUBCUTANEOUS
  Filled 2018-07-04 (×6): qty 0.4

## 2018-07-04 MED ORDER — PANTOPRAZOLE SODIUM 40 MG PO TBEC
40.0000 mg | DELAYED_RELEASE_TABLET | Freq: Every day | ORAL | Status: DC
  Administered 2018-07-05 – 2018-07-10 (×6): 40 mg via ORAL
  Filled 2018-07-04 (×6): qty 1

## 2018-07-04 MED ORDER — DEXTROSE-NACL 5-0.45 % IV SOLN
INTRAVENOUS | Status: DC
  Administered 2018-07-05: 01:00:00 via INTRAVENOUS

## 2018-07-04 MED ORDER — BENZTROPINE MESYLATE 1 MG PO TABS
2.0000 mg | ORAL_TABLET | Freq: Two times a day (BID) | ORAL | Status: DC
  Administered 2018-07-05 – 2018-07-10 (×11): 2 mg via ORAL
  Filled 2018-07-04 (×12): qty 2

## 2018-07-04 NOTE — ED Notes (Signed)
Patient transported to CT 

## 2018-07-04 NOTE — ED Triage Notes (Signed)
Patient BIB [facility] from home with hyperglycemia reading >500. S/S of slurred speech and lethargic. Patient stated to EMS that he just got a new insulin pen and theres concerns that he may not know how to use it properly.  No pain, neuro assessment intact.

## 2018-07-04 NOTE — H&P (Addendum)
[facility] Admission History and Physical Service Pager: [phone]  Patient name: Dylan Jordan Medical record number: [id number] Date of birth: 08-Aug-1955 Age: 63 y.o. Gender: male  Primary Care Provider: [name], DO Consultants: IP CONSULT TO FAMILY PRACTICE Code Status: Prior   Chief Complaint: Hyperglycemia   Assessment and Plan: Dylan Jordan is a 63 y.o. male admitted for HHS.  His chronic conditions include anemia, asthma, schizophrenia, DM, HLD and HTN.  Diagnostic criteria for HHS include glucose > 540-600 mg/dL (30-33.3 mmol/L), effective serum osmolality > 320 mOsm/kg, arterial pH > 7.3, serum bicarbonate > 15-18 mEq/L (15-18 mmol/L), little or no ketonemia and ketonuria, and stupor or coma.   #Hyperglycemia concerning for HHS Likely 2/2 noncompliance, patient reports he has not taken his medication for some time now. Took Victoza yesterday but not lantus. Patient presents with hyperglycemia to 767, altered mental status. Patient reports that his apartment manager found him in his chair shaking and called EMS. Per EMS report, patient with slurred speech and lethargic. Per ED nurse, when patient first arrived to the ED, he was stuporous and barely able to stay awake during questioning.  Initial labs with glucose 767, Na 125 (corrected to 141), Cr 1.99, BUN 21.  CT head without contrast was performed in setting of patient's lethargy and was negative.  EtOH was negative.  Per after hours call from 2/19, patient was unable to take his Lantus due to broken pen needle.  Per telephone encounter note, patient did not endorse any symptoms and feels at baseline; however, upon interview at admission, patient reports that he has been feeling like he has been "shuffling" over the last couple of days which he states he thought was 2/2 haldol.  Patient's presentation is concerning for HHS with elevated glucose, mental status change, and no ketonuria.  However,  patient's initial calculated serum osmolality is only mildly elevated at 297 mOsm/kg. Patient's mental status improving significantly, now oriented x4. Pt was eating [place] sandwich and apple sauce on arrival to room. A1C elevated to 11.7 so likely patient has been hyperglycemic for some time now.  . Admit to FMTS - Unit: Stepdown - Attending: Dr. Erin Hearing  . Continuous Cardiac monitoring & pulse ox . Vitals per unit protocol . Out of bed with assistance, fall risk . Phase 1 - HHS Protocol   BMP every 4 hours   S/p 1L NS bolus in ED. Transition to mIVF as below.   NS 125 mL/hr until CBG is <300  When CBG <300, follow with D51/2NS at 125 ml/hr.   Continue to monitor potassium, replete PRN w/ IV KCl   If K <5.2, 20 mEq/hr. If K >5.2, HOLD KCl  If K <3.2 - HOLD insulin   Adjust insulin gtt for CBGs 200-300  Strict I/Os   Diet NPO while on drip  Patient to transition when with improved mental status, able to take PO, CBG <300 and Posm <300. Phase 2 HHS protocol - per day team  Consult Diabetes Coordinator   AM A1C, Mg, Phos  AM EKG  #AKI in setting of CKD?  Likely due to hypovolemia in setting of >500 glucosuria. Cr. 1.99. Will likely improve with fluid hydration.   AM BMP   Chronic Issues  #Schizophrenia Followed by Beverly Sessions. Patient's mental status at baseline per patient, he is aware that he is slurring, which is worse than baseline. Patient reports he started slurring his speech more often after starting haldol injections. Pt missed 2,000 mg  depakote and haldol dose this evening due to hospitalization and AMS. No depakote level since January 2019.   Home haldol injection q 28 days, last dose ?   Continue home Cogentin 2 mg twice daily, Depakote 500 mg a.m., 2000 mg p.m., home lithium 300 mg twice daily with meals Haldol 5 mg nightly.   F/u depakote level  # DM Patient seen with Dr. Valentina Lucks on 07/01/2018, he was started on Lantus 10 units every morning, continued  Victoza at 1.8 mg every morning, continued glimepiride 4 mg daily, discontinued metformin 750 mg daily.  . Continue home gabapentin 300 mg 3 times daily . Hold patient glimepiride 4 mg daily . Pt will need close pt follow up in outpatient setting after discharge   # Anemia Most recent hemoglobin 10.5 at 1650. Baseline appears to be 11, ranging from 10.4-12.7 in 2019. Expect acute drops in setting rehydration.  . Continue home 325 mg ferrous sulfate. . AM CBC   # Ashtma, mild persistent  Patient reports not taking his home Flovent 2 puffs twice daily. Pt is not wheezing on exam today and denies any SOB.  . Home albuterol as needed  #Gout Patient reports that he does not take on colchicine.  Patient does not have any complaints or physical exam findings significant for acute gout flare. . Holding home allopurinol and colchicine  # HLD Most recent lipid panel in November 2019 significant for triglycerides of 352.  Currently followed by Dr. Valent[facility] at [facility].  Patient's current calculated ASCVD risk at 41.3% . Continue home atorvastatin 20 mg. . Consider increasing to 80 mg  # HTN  Normotensive. . Holding home losartan 25 mg daily  #GERD  Omeprazole 40 mg nightly   BPH Continue Flomax 0.4 mg daily  #FEN/GI:  . Fluids: per HHS protocol   . Electrolytes: BMP q 4 hours   . Nutrition:  NPO  Access: PIV VTE prophylaxis: Lovenox q24hrs  Disposition: Admit to Progressive while on drip.   History of the Present Illness  Dylan Jordan is a 63 y.o. male who presents with hyperglycemia.  Per chart review, patient spoke to Dr. Tammi Klippel over the phone on the after-hours line about lantus pen being broken. Pt advised to check sugars and seek care if sugars were high (see event note for details).  Pt reports that he's been feeling like he's been "shuffling" for the last few days. He reports that his apartment manager call ED for patient after he was found to be sitting in a chair, shaking. He  has had a recent change in his diabetes regimen and wasn't able to use his lantus. Pt follows closely with Dr. Valentina Lucks at [facility]. Pt does notice that his speech is slurred during out interview today. He reports that it is slurred at baseline, especially after haldol medication, but does notice that it is worse in this setting. Pt is feeling better and reports he feels like he is "coming around". Pt was noted to be eating [place] sandwich and apple sauce in the room.  Pt reports an episode of diarrhea, but has not other complaints today. Denies CP, N/V.   In the ED, initial EKG showed sinus rhythm without significant changes from previous tracing.  BMP was significant for glucose of 767, sodium 125, potassium 6.1.  Patient was started on 1 L normal saline and insulin drip.  Patient's pH was 7.275, ethanol negative, UA significant for glucosuria and rare bacteria.  Chest x-ray was negative for acute findings.  CT head without contrast was obtained for altered mental status and showed no acute findings.  Patient remained on drip and was admitted to family medicine teaching service.  Review Of Systems: Per HPI, with the following additions:  Review of Systems  Constitutional: Negative.   HENT: Positive for hearing loss.   Respiratory: Negative.  Negative for cough, shortness of breath and wheezing.   Cardiovascular: Negative.  Negative for chest pain.  Gastrointestinal: Positive for diarrhea.  Genitourinary: Negative.  Negative for dysuria.  Musculoskeletal: Negative.   Skin: Negative.   Neurological: Positive for dizziness, speech change and weakness. Negative for tremors, focal weakness, seizures and loss of consciousness.  Psychiatric/Behavioral: Negative.    Patient Active Problem List   Diagnosis Date Noted  . Hyperlipidemia 12/18/2017  . 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 Intermittent chest pain 11/25/2017  . Allergic rhinitis with a nonallergic component 07/30/2017  . Food  allergy 07/30/2017  . C. difficile diarrhea 05/22/2017  . Chronic gout involving toe of left foot without tophus 05/18/2017  . Hemorrhoids 05/04/2017  . Haloperidol adverse reaction 04/16/2017  . Chronic low back pain 01/03/2017  . Chronic cough 06/30/2016  . Drug-induced mood disorder ([place]) 06/14/2016  . Type 2 diabetes mellitus with diabetic neuropathy ([place]) 05/26/2016  . Iron deficiency anemia 05/26/2016  . Chronic kidney disease 05/26/2016  . HTN (hypertension) 05/26/2016  . Tobacco abuse 05/26/2016  . History of substance abuse ([place]) 05/26/2016  . History of alcohol abuse 05/26/2016  . Mild persistent asthma 05/26/2016   Past Medical History: Past Medical History:  Diagnosis Date  . Anemia   . Asthma   . Diabetes mellitus without complication ([place])   . Gout   . Hyperlipidemia   . Hypertension    Past Surgical History: Past Surgical History:  Procedure Laterality Date  . REPLACEMENT TOTAL KNEE Left   . TOE SURGERY     Family History: family history is negative for Allergic rhinitis, Angioedema, Asthma, Atopy, Eczema, Immunodeficiency, and Urticaria. He was adopted.   Social History: Social History   Social History Narrative  . Not on file     Allergies and Medications: Allergies  Allergen Reactions  . Chantix [Varenicline Tartrate] Other (See Comments)    Nightmares  . Ativan [Lorazepam] Other (See Comments)    Agitation (and "5 others like it") Will need to call [facility] in [place] (phone) [phone] and [phone] to fax a release-   . Penicillins Diarrhea    Has patient had a PCN reaction causing immediate rash, facial/tongue/throat swelling, SOB or lightheadedness with hypotension: No Has patient had a PCN reaction causing severe rash involving mucus membranes or skin necrosis: No Has patient had a PCN reaction that required hospitalization: No Has patient had a PCN reaction occurring within the last 10 years: No If all of the above  answers are "NO", then may proceed with Cephalosporin use.    No current facility-administered medications on file prior to encounter.    Current Outpatient Medications on File Prior to Encounter  Medication Sig Dispense Refill  . ACCU-CHEK SOFTCLIX LANCETS lancets use as directed *may use THREE TIMES DAILY to check blood sugar* 100 each 11  . acetaminophen (TYLENOL) 650 MG CR tablet Take 650 mg by mouth 2 (two) times daily as needed for pain.     Marland Kitchen albuterol (PROVENTIL HFA;VENTOLIN HFA) 108 (90 Base) MCG/ACT inhaler Inhale 1-2 puffs into the lungs every 6 (six) hours as needed for wheezing or shortness  of breath. (Patient not taking: Reported on 06/25/2018) 1 Inhaler 2  . allopurinol (ZYLOPRIM) 100 MG tablet Take 2 tablets (200 mg total) by mouth daily. 180 tablet 3  . atorvastatin (LIPITOR) 20 MG tablet Take 1 tablet (20 mg total) by mouth daily. 90 tablet 3  . benztropine (COGENTIN) 2 MG tablet Take 2 mg by mouth 2 (two) times daily.    . Blood Glucose Monitoring Suppl (ACCU-CHEK AVIVA PLUS) w/Device KIT Use to check blood sugar three times daily or as directed. 1 kit 0  . Colchicine (MITIGARE) 0.6 MG CAPS Take 0.6 mg by mouth daily. Take 2 tablets once, then take 1 tablet one hour later. (Patient not taking: Reported on 06/25/2018) 14 capsule 1  . diclofenac sodium (VOLTAREN) 1 % GEL Apply 2 g topically 4 (four) times daily for 13 days. (Patient not taking: Reported on 07/01/2018) 100 g 0  . divalproex (DEPAKOTE) 500 MG DR tablet Take 500-2,000 mg by mouth See admin instructions. Take 546m every morning and 2000 tabs every evening    . ferrous sulfate (FERROUSUL) 325 (65 FE) MG tablet Take 1 tablet (325 mg total) by mouth daily with breakfast. 90 tablet 3  . fluticasone (FLOVENT HFA) 110 MCG/ACT inhaler Inhale 2 puffs into the lungs 2 (two) times daily. (Patient not taking: Reported on 07/01/2018) 1 Inhaler 5  . gabapentin (NEURONTIN) 300 MG capsule Take 1 capsule (300 mg total) by mouth 3  (three) times daily as needed. (Patient taking differently: Take 300 mg by mouth 3 (three) times daily as needed (nerve pain). ) 90 capsule 1  . glimepiride (AMARYL) 4 MG tablet Take 4 mg by mouth daily.    .Marland Kitchenglucose blood (ACCU-CHEK GUIDE) test strip Use to check blood sugar three times daily or as directed. 100 each 12  . haloperidol (HALDOL) 5 MG tablet Take 5 mg by mouth at bedtime.    . haloperidol decanoate (HALDOL DECANOATE) 100 MG/ML injection Inject 400 mg into the muscle every 28 (twenty-eight) days.    .Marland Kitchenibuprofen (ADVIL,MOTRIN) 800 MG tablet Take 800 mg by mouth every 6 (six) hours as needed. for pain    . Insulin Glargine (LANTUS SOLOSTAR) 100 UNIT/ML Solostar Pen Inject 10 Units into the skin daily. 1 pen 0  . Insulin Pen Needle (PEN NEEDLES) 32G X 5 MM MISC 1 pen by Does not apply route daily. 100 each 2  . lidocaine (LIDODERM) 5 % Place 1 patch onto the skin daily for 30 doses. Remove & Discard patch within 12 hours or as directed by MD 30 patch 0  . liraglutide (VICTOZA) 18 MG/3ML SOPN Inject 0.3 mLs (1.8 mg total) into the skin daily. 3 pen 2  . lithium carbonate 300 MG capsule Take 300 mg by mouth 2 (two) times daily with a meal.    . losartan (COZAAR) 25 MG tablet Take 1 tablet (25 mg total) by mouth daily. 90 tablet 2  . Multiple Vitamin (DAILY-VITE) TABS TAKE 1 TABLET BY MOUTH DAILY. 30 tablet 11  . NOVOFINE PLUS 32G X 4 MM MISC 1 Container by Does not apply route daily.    .Marland Kitchenomeprazole (PRILOSEC) 20 MG capsule Take 2 capsules (40 mg total) by mouth every evening. 60 capsule 2  . tamsulosin (FLOMAX) 0.4 MG CAPS capsule Take 0.4 mg by mouth daily.  11   Objective: BP 124/78   Pulse 87   Resp 14   SpO2 97%  There were no vitals filed for this visit.  Appearance: The patient is well-appearing and appears stated age. No obvious dysmorphias.  Head: There is no tenderness over the scalp or neck and no bruits over the eyes or at the neck. There is no proptosis, lid swelling,  conjunctival injection, or chemosis.  Cardiac: RRR. No murmurs.  Lungs: CTAB with no wheezes, rales, or rhonchi appreciated.  Abdomen:non-distended and non-tender to light and deep palpation. Positive bowel sounds. Abdominal aorta palpated at 2".   Neurologic examination: Level of Consciousness: Awake Mental status:  Cognition:The patient is alert, inattentive at times, able to follow directions but with slow response or when re-oriented or when request repeated 2-3x, and oriented x4. Speech is slurred.  Cranial nerves: CN II: Visual fields are full to confrontation. Pupils are 4 mm and briskly reactive to light.  CN III, IV, VI: At primary gaze, there is no eye deviation. No ptosis. EOMI though difficult to obtain due to patients mental status.  CN V: Facial sensation is intact to pinprick in all 3 divisions bilaterally.  CN VII: Face is symmetric with normal eye closure and smile. CN VII: Hearing is normal to rubbing fingers CN IX, X: Palate elevates symmetrically. Phonation is normal. CN XI: Head turning and shoulder shrug are intact CN XII: Tongue is midline with normal movements and no atrophy. Motor: There is no pronator drift of out-stretched arms. Muscle bulk and tone are normal. Strength is 5/5 in all major muscle groups bilaterally. Deltoid, biceps, triceps, wrist extension, finger abduction, hip flexion, hip extension, knee flexion, knee extension, ankle flexion, ankle extension. Reflexes: Reflexes are 2+ and symmetric at the biceps, triceps, knees, and ankles. Plantar responses are flexor. Sensory: Light touch, pinprick, position sense, and vibration sense are intact in fingers and toes. Coordination: Rapid alternating movements and fine finger movements are intact. There is no dysmetria on finger-to-nose and heel-knee-shin. There are no abnormal or extraneous movements. Romberg is absent. Gait/Stance: Posture is normal. Gait is steady with normal steps, base, arm swing, and  turning. Heel and toe walking are normal. Tandem gait is normal when the patient closes one of her eyes.  Labs and Imaging: I have personally reviewed following labs and imaging studies  CBC: Recent Labs  Lab 07/04/18 1554 07/04/18 1650  WBC 7.0  --   NEUTROABS 4.0  --   HGB 9.5* 10.5*  HCT 32.0* 31.0*  MCV 94.7  --   PLT 247  --    Basic Metabolic Panel: Recent Labs  Lab 07/04/18 1554 07/04/18 1650  NA 125* 128*  K 6.1* 6.3*  CL 97*  --   CO2 21*  --   GLUCOSE 767*  --   BUN 13  --   CREATININE 1.99*  --   CALCIUM 8.7*  --    CBG: Recent Labs  Lab 07/04/18 1610 07/04/18 1727 07/04/18 1833 07/04/18 1934 07/04/18 2039  GLUCAP >600* 501* 397* 330* 236*   Urine analysis:    Component Value Date/Time   COLORURINE STRAW (A) 07/04/2018 1655   APPEARANCEUR CLEAR 07/04/2018 1655   LABSPEC 1.012 07/04/2018 1655   PHURINE 6.0 07/04/2018 1655   GLUCOSEU >=500 (A) 07/04/2018 1655   HGBUR NEGATIVE 07/04/2018 1655   BILIRUBINUR NEGATIVE 07/04/2018 1655   BILIRUBINUR negative 01/03/2018 1445   KETONESUR NEGATIVE 07/04/2018 1655   PROTEINUR NEGATIVE 07/04/2018 1655   UROBILINOGEN 0.2 01/03/2018 1445   NITRITE NEGATIVE 07/04/2018 1655   LEUKOCYTESUR NEGATIVE 07/04/2018 1655   Imaging: Ct Head Wo Contrast  Result Date: 07/04/2018 CLINICAL DATA:  Slurred speech and lethargy. EXAM: CT HEAD WITHOUT CONTRAST TECHNIQUE: Contiguous axial images were obtained from the base of the skull through the vertex without intravenous contrast. COMPARISON:  05/20/2017 FINDINGS: Brain: Ventricles and cisterns are within normal. Mild age related atrophic change. Subtle chronic ischemic microvascular disease. No mass, mass effect, shift of midline structures or acute hemorrhage. No evidence of acute infarction. Vascular: No hyperdense vessel or unexpected calcification. Skull: Normal. Negative for fracture or focal lesion. Sinuses/Orbits: No acute finding. Other: None. IMPRESSION: No acute  findings. Mild age related atrophic change and chronic ischemic microvascular disease. Electronically Signed   By: Marin Olp M.D.   On: 07/04/2018 18:03   Dg Chest Portable 1 View  Result Date: 07/04/2018 CLINICAL DATA:  Hypoglycemia. EXAM: PORTABLE CHEST 1 VIEW COMPARISON:  08/24/2017 FINDINGS: Lungs are hypoinflated with slight elevation of the right hemidiaphragm. No focal lobar consolidation or effusion. Cardiomediastinal silhouette and remainder of the exam is unchanged. IMPRESSION: Hypoinflation without acute cardiopulmonary disease. Electronically Signed   By: Marin Olp M.D.   On: 07/04/2018 17:06    EKG Interpretation  Date/Time:  Thursday July 04 2018 15:46:54 EST Ventricular Rate:  85 PR Interval:    QRS Duration: 89 QT Interval:  381 QTC Calculation: 453 R Axis:   -13 Text Interpretation:  Sinus rhythm Abnormal R-wave progression, early transition ST elevation, consider inferior injury No significant change since last tracing Confirmed by Dorie Rank [phone]) on 07/04/2018 5:25:52 PM       Procedures:  None   Wilber Oliphant, M.D. 07/04/2018, 8:54 PM PGY-1, [place] Intern pager: [phone], text pages welcome  FPTS Upper-Level Resident Addendum   I have independently interviewed and examined the patient. I have discussed the above with the original author and agree with their documentation. My edits for correction/addition/clarification are in blue. Please see also any attending notes.   Caroline More, DO PGY-2, [facility] 07/05/2018 3:02 AM  FPTS Service pager: [phone] (text pages welcome through [facility])

## 2018-07-04 NOTE — ED Notes (Signed)
Pt has required multiple sticks to attempt to check blood sugar.

## 2018-07-04 NOTE — ED Notes (Signed)
Paged admitting to RN Jess

## 2018-07-04 NOTE — ED Notes (Signed)
X-ray at bedside

## 2018-07-04 NOTE — ED Provider Notes (Signed)
[facility] EMERGENCY DEPARTMENT Provider Note   CSN: [id number] Arrival date & time: 07/04/18  1541    History   Chief Complaint Chief Complaint  Patient presents with  . Hyperglycemia    HPI Dylan Jordan is a 63 y.o. male.   HPI Patient presents to the emergency room for evaluation of hyperglycemia.  Patient has a history of diabetes.  According to the EMS report his medication regimen has changed recently.  Patient's daughter called the patient and noticed that his speech was very slurred.  They called EMS.  When EMS found the patient he was listless and when I checked his blood sugar it was reading as greater than 550.  Patient does have a history of severe hyperglycemia in the past.  Patient himself states he has some discomfort in his legs but denies any fevers.  He denies any falls.  No headache or chest pain. Past Medical History:  Diagnosis Date  . Anemia   . Asthma   . Diabetes mellitus without complication ([place])   . Gout   . Hyperlipidemia   . Hypertension     Patient Active Problem List   Diagnosis Date Noted  . Hyperlipidemia 12/18/2017  . 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 Intermittent chest pain 11/25/2017  . Allergic rhinitis with a nonallergic component 07/30/2017  . Food allergy 07/30/2017  . C. difficile diarrhea 05/22/2017  . Chronic gout involving toe of left foot without tophus 05/18/2017  . Hemorrhoids 05/04/2017  . Haloperidol adverse reaction 04/16/2017  . Chronic low back pain 01/03/2017  . Chronic cough 06/30/2016  . Drug-induced mood disorder ([place]) 06/14/2016  . Type 2 diabetes mellitus with diabetic neuropathy ([place]) 05/26/2016  . Iron deficiency anemia 05/26/2016  . Chronic kidney disease 05/26/2016  . HTN (hypertension) 05/26/2016  . Tobacco abuse 05/26/2016  . History of substance abuse ([place]) 05/26/2016  . History of alcohol abuse 05/26/2016  . Mild persistent asthma 05/26/2016    Past  Surgical History:  Procedure Laterality Date  . REPLACEMENT TOTAL KNEE Left   . TOE SURGERY          Home Medications    Prior to Admission medications   Medication Sig Start Date End Date Taking? Authorizing Provider  ACCU-CHEK SOFTCLIX LANCETS lancets use as directed *may use THREE TIMES DAILY to check blood sugar* 06/28/17   [name], DO  acetaminophen (TYLENOL) 650 MG CR tablet Take 650 mg by mouth 2 (two) times daily as needed for pain.     [provider]  albuterol (PROVENTIL HFA;VENTOLIN HFA) 108 (90 Base) MCG/ACT inhaler Inhale 1-2 puffs into the lungs every 6 (six) hours as needed for wheezing or shortness of breath. Patient not taking: Reported on 06/25/2018 03/25/18   Bobbitt, Sed[place] Muta, MD  allopurinol (ZYLOPRIM) 100 MG tablet Take 2 tablets (200 mg total) by mouth daily. 01/10/18   Zenia Resides, MD  atorvastatin (LIPITOR) 20 MG tablet Take 1 tablet (20 mg total) by mouth daily. 03/07/18   [name], DO  benztropine (COGENTIN) 2 MG tablet Take 2 mg by mouth 2 (two) times daily.    [provider]  Blood Glucose Monitoring Suppl (ACCU-CHEK AVIVA PLUS) w/Device KIT Use to check blood sugar three times daily or as directed. 05/27/18   [name], DO  Colchicine (MITIGARE) 0.6 MG CAPS Take 0.6 mg by mouth daily. Take 2 tablets once, then take 1 tablet one hour later. Patient not taking:  Reported on 06/25/2018 12/13/17   Marjie Skiff, MD  diclofenac sodium (VOLTAREN) 1 % GEL Apply 2 g topically 4 (four) times daily for 13 days. Patient not taking: Reported on 07/01/2018 06/25/18 07/08/18  Lennice Sites, DO  divalproex (DEPAKOTE) 500 MG DR tablet Take 500-2,000 mg by mouth See admin instructions. Take 583m every morning and 2000 tabs every evening    [provider]  ferrous sulfate (FERROUSUL) 325 (65 FE) MG tablet Take 1 tablet (325 mg total) by mouth daily with breakfast. 03/06/18   [name], AGardiner Rhyme DO  fluticasone (FLOVENT  HFA) 110 MCG/ACT inhaler Inhale 2 puffs into the lungs 2 (two) times daily. Patient not taking: Reported on 07/01/2018 07/30/17   Bobbitt, RSedalia Muta MD  gabapentin (NEURONTIN) 300 MG capsule Take 1 capsule (300 mg total) by mouth 3 (three) times daily as needed. Patient taking differently: Take 300 mg by mouth 3 (three) times daily as needed (nerve pain).  05/03/18   RSteve Rattler DO  glimepiride (AMARYL) 4 MG tablet Take 4 mg by mouth daily. 02/17/18   [provider]  glucose blood (ACCU-CHEK GUIDE) test strip Use to check blood sugar three times daily or as directed. 05/31/18   RSteve Rattler DO  haloperidol (HALDOL) 5 MG tablet Take 5 mg by mouth at bedtime.    [provider]  haloperidol decanoate (HALDOL DECANOATE) 100 MG/ML injection Inject 400 mg into the muscle every 28 (twenty-eight) days.    [provider]  ibuprofen (ADVIL,MOTRIN) 800 MG tablet Take 800 mg by mouth every 6 (six) hours as needed. for pain 05/29/18   [provider]  Insulin Glargine (LANTUS SOLOSTAR) 100 UNIT/ML Solostar Pen Inject 10 Units into the skin daily. 07/03/18   ACaroline More DO  Insulin Pen Needle (PEN NEEDLES) 32G X 5 MM MISC 1 pen by Does not apply route daily. 01/24/18   HZenia Resides MD  lidocaine (LIDODERM) 5 % Place 1 patch onto the skin daily for 30 doses. Remove & Discard patch within 12 hours or as directed by MD 06/25/18 07/25/18  Curatolo, Adam, DO  liraglutide (VICTOZA) 18 MG/3ML SOPN Inject 0.3 mLs (1.8 mg total) into the skin daily. 04/09/18   RSteve Rattler DO  lithium carbonate 300 MG capsule Take 300 mg by mouth 2 (two) times daily with a meal.    [provider]  losartan (COZAAR) 25 MG tablet Take 1 tablet (25 mg total) by mouth daily. 01/24/18   HZenia Resides MD  Multiple Vitamin (DAILY-VITE) TABS TAKE 1 TABLET BY MOUTH DAILY. 10/03/16   TSela Hilding MD  NOVOFINE PLUS 32G X 4 MM MISC 1 Container by Does not apply route  daily. 05/18/18   [provider]  omeprazole (PRILOSEC) 20 MG capsule Take 2 capsules (40 mg total) by mouth every evening. 05/16/18   RSteve Rattler DO  tamsulosin (FLOMAX) 0.4 MG CAPS capsule Take 0.4 mg by mouth daily. 03/28/18   [provider]    Family History Family History  Adopted: Yes  Problem Relation Age of Onset  . Allergic rhinitis Neg Hx   . Angioedema Neg Hx   . Asthma Neg Hx   . Atopy Neg Hx   . Eczema Neg Hx   . Immunodeficiency Neg Hx   . Urticaria Neg Hx     Social History Social History   Tobacco Use  . Smoking status: Current Some Day Smoker    Packs/day: 0.30  Years: 45.00    Pack years: 13.50    Types: Cigarettes    Start date: 05/16/1971  . Smokeless tobacco: Never Used  . Tobacco comment: Decreased Intake to 10 cigarettes per week or less.  Substance Use Topics  . Alcohol use: No  . Drug use: No     Allergies   Chantix [varenicline tartrate]; Ativan [lorazepam]; and Penicillins   Review of Systems Review of Systems  All other systems reviewed and are negative.    Physical Exam Updated Vital Signs BP 135/77   Pulse 87   Resp 18   SpO2 97%   Physical Exam Vitals signs and nursing note reviewed.  Constitutional:      General: He is not in acute distress.    Appearance: He is well-developed.  HENT:     Head: Normocephalic and atraumatic.     Right Ear: External ear normal.     Left Ear: External ear normal.  Eyes:     General: No scleral icterus.       Right eye: No discharge.        Left eye: No discharge.     Conjunctiva/sclera: Conjunctivae normal.  Neck:     Musculoskeletal: Neck supple.     Trachea: No tracheal deviation.  Cardiovascular:     Rate and Rhythm: Normal rate and regular rhythm.  Pulmonary:     Effort: Pulmonary effort is normal. No respiratory distress.     Breath sounds: Normal breath sounds. No stridor. No wheezing or rales.  Abdominal:     General: Bowel sounds are normal. There is  no distension.     Palpations: Abdomen is soft.     Tenderness: There is no abdominal tenderness. There is no guarding or rebound.  Musculoskeletal:        General: No tenderness.  Skin:    General: Skin is warm and dry.     Findings: No rash.  Neurological:     Mental Status: He is lethargic.     GCS: GCS eye subscore is 4. GCS verbal subscore is 4. GCS motor subscore is 6.     Cranial Nerves: No cranial nerve deficit (no facial droop, extraocular movements intact, no slurred speech).     Sensory: No sensory deficit.     Motor: No abnormal muscle tone or seizure activity.     Coordination: Coordination abnormal.     Comments: Patient does have equal grip strength and equal plantar flexion strength bilaterally, speech is slurred      ED Treatments / Results  Labs (all labs ordered are listed, but only abnormal results are displayed) Labs Reviewed  CBC WITH DIFFERENTIAL/PLATELET - Abnormal; Notable for the following components:      Result Value   RBC 3.38 (*)    Hemoglobin 9.5 (*)    HCT 32.0 (*)    MCHC 29.7 (*)    All other components within normal limits  URINALYSIS, ROUTINE W REFLEX MICROSCOPIC - Abnormal; Notable for the following components:   Color, Urine STRAW (*)    Glucose, UA >=500 (*)    Bacteria, UA RARE (*)    All other components within normal limits  BASIC METABOLIC PANEL - Abnormal; Notable for the following components:   Sodium 125 (*)    Potassium 6.1 (*)    Chloride 97 (*)    CO2 21 (*)    Glucose, Bld 767 (*)    Creatinine, Ser 1.99 (*)    Calcium 8.7 (*)  GFR calc non Af Amer 35 (*)    GFR calc Af Amer 41 (*)    All other components within normal limits  CBG MONITORING, ED - Abnormal; Notable for the following components:   Glucose-Capillary >600 (*)    All other components within normal limits  POCT I-STAT EG7 - Abnormal; Notable for the following components:   pO2, Ven 28.0 (*)    Acid-base deficit 6.0 (*)    Sodium 128 (*)    Potassium  6.3 (*)    HCT 31.0 (*)    Hemoglobin 10.5 (*)    All other components within normal limits  CBG MONITORING, ED - Abnormal; Notable for the following components:   Glucose-Capillary >600 (*)    All other components within normal limits  CBG MONITORING, ED - Abnormal; Notable for the following components:   Glucose-Capillary 501 (*)    All other components within normal limits  ETHANOL  I-STAT VENOUS BLOOD GAS, ED    EKG EKG Interpretation  Date/Time:  Thursday July 04 2018 15:46:54 EST Ventricular Rate:  85 PR Interval:    QRS Duration: 89 QT Interval:  381 QTC Calculation: 453 R Axis:   -13 Text Interpretation:  Sinus rhythm Abnormal R-wave progression, early transition ST elevation, consider inferior injury No significant change since last tracing Confirmed by Dorie Rank [phone]) on 07/04/2018 5:25:52 PM   Radiology Ct Head Wo Contrast  Result Date: 07/04/2018 CLINICAL DATA:  Slurred speech and lethargy. EXAM: CT HEAD WITHOUT CONTRAST TECHNIQUE: Contiguous axial images were obtained from the base of the skull through the vertex without intravenous contrast. COMPARISON:  05/20/2017 FINDINGS: Brain: Ventricles and cisterns are within normal. Mild age related atrophic change. Subtle chronic ischemic microvascular disease. No mass, mass effect, shift of midline structures or acute hemorrhage. No evidence of acute infarction. Vascular: No hyperdense vessel or unexpected calcification. Skull: Normal. Negative for fracture or focal lesion. Sinuses/Orbits: No acute finding. Other: None. IMPRESSION: No acute findings. Mild age related atrophic change and chronic ischemic microvascular disease. Electronically Signed   By: Marin Olp M.D.   On: 07/04/2018 18:03   Dg Chest Portable 1 View  Result Date: 07/04/2018 CLINICAL DATA:  Hypoglycemia. EXAM: PORTABLE CHEST 1 VIEW COMPARISON:  08/24/2017 FINDINGS: Lungs are hypoinflated with slight elevation of the right hemidiaphragm. No focal  lobar consolidation or effusion. Cardiomediastinal silhouette and remainder of the exam is unchanged. IMPRESSION: Hypoinflation without acute cardiopulmonary disease. Electronically Signed   By: Marin Olp M.D.   On: 07/04/2018 17:06    Procedures .Critical Care Performed by: Dorie Rank, MD Authorized by: Dorie Rank, MD   Critical care provider statement:    Critical care time (minutes):  45   Critical care was time spent personally by me on the following activities:  Discussions with consultants, evaluation of patient's response to treatment, examination of patient, ordering and performing treatments and interventions, ordering and review of laboratory studies, ordering and review of radiographic studies, pulse oximetry, re-evaluation of patient's condition, obtaining history from patient or surrogate and review of old charts   (including critical care time)  Medications Ordered in ED Medications  sodium chloride 0.9 % bolus 1,000 mL (0 mLs Intravenous Stopped 07/04/18 1716)    And  0.9 %  sodium chloride infusion (125 mL/hr Intravenous New Bag/Given 07/04/18 1729)  insulin regular, human (MYXREDLIN) 100 units/ 100 mL infusion (4.4 Units/hr Intravenous Rate/Dose Change 07/04/18 1728)     Initial Impression / Assessment and Plan / ED Course  I have reviewed the triage vital signs and the nursing notes.  Pertinent labs & imaging results that were available during my care of the patient were reviewed by me and considered in my medical decision making (see chart for details).  Clinical Course as of Jul 05 1827  Thu Jul 04, 2018  1828 Laboratory tests are notable for hyperglycemia.  Patient also has evidence of hyponatremia and hyperkalemia.  He does have evidence of acute kidney injury.  Venous pH does not show acidosis.   [JK]  1828 Hemoglobin is also decreased from prior.  Urinalysis without signs of infection   [JK]  1829 Chest x-ray and CT scan without acute findings.   [JK]      Clinical Course User Index [JK] Dorie Rank, MD    Patient presented to the ED for evaluation of altered mental status.  Patient was noted to be significantly hyperglycemic.  Patient's findings are suggestive of hyperosmolar nonketotic coma.  His bicarb is slightly decreased but he has a normal venous pH does not have an elevated anion gap.  No signs of acute infection.  The patient may have had some confusion with his new diabetes regimen.  He has been started IV fluids and insulin infusion.  I will consult the medical service for admission.  Final Clinical Impressions(s) / ED Diagnoses   Final diagnoses:  Hyperosmolar hyperglycemic coma due to diabetes mellitus without ketoacidosis (HCC)      Dorie Rank, MD 07/04/18 [phone]

## 2018-07-04 NOTE — Telephone Encounter (Signed)
Spoke with Dylan Jordan regarding his broken Lantus pen. He could not find his pen or his pen needles during the time of our call. He requested a call back 10 minutes later, but he did not answer the call. I left a voicemail requesting that he bring all of his medications to his appointment with Dr. Wonda Olds tomorrow at 2:10pm.  Dylan Jordan, PharmD PGY1 Pharmacy Resident Phone: [phone] 07/04/2018 1:48 PM

## 2018-07-05 ENCOUNTER — Ambulatory Visit: Payer: Medicaid Other | Admitting: Podiatry

## 2018-07-05 ENCOUNTER — Ambulatory Visit: Payer: Medicaid Other | Admitting: Family Medicine

## 2018-07-05 ENCOUNTER — Telehealth: Payer: Self-pay

## 2018-07-05 ENCOUNTER — Encounter (HOSPITAL_COMMUNITY): Payer: Self-pay | Admitting: *Deleted

## 2018-07-05 DIAGNOSIS — E1122 Type 2 diabetes mellitus with diabetic chronic kidney disease: Secondary | ICD-10-CM

## 2018-07-05 DIAGNOSIS — R739 Hyperglycemia, unspecified: Secondary | ICD-10-CM

## 2018-07-05 DIAGNOSIS — D649 Anemia, unspecified: Secondary | ICD-10-CM | POA: Diagnosis present

## 2018-07-05 DIAGNOSIS — E1101 Type 2 diabetes mellitus with hyperosmolarity with coma: Secondary | ICD-10-CM | POA: Diagnosis present

## 2018-07-05 DIAGNOSIS — Z794 Long term (current) use of insulin: Secondary | ICD-10-CM | POA: Diagnosis present

## 2018-07-05 DIAGNOSIS — E119 Type 2 diabetes mellitus without complications: Secondary | ICD-10-CM | POA: Diagnosis present

## 2018-07-05 LAB — BASIC METABOLIC PANEL
Anion gap: 11 (ref 5–15)
Anion gap: 5 (ref 5–15)
Anion gap: 7 (ref 5–15)
Anion gap: 8 (ref 5–15)
BUN: 11 mg/dL (ref 8–23)
BUN: 12 mg/dL (ref 8–23)
BUN: 13 mg/dL (ref 8–23)
BUN: 14 mg/dL (ref 8–23)
CHLORIDE: 108 mmol/L (ref 98–111)
CHLORIDE: 111 mmol/L (ref 98–111)
CO2: 19 mmol/L — ABNORMAL LOW (ref 22–32)
CO2: 21 mmol/L — ABNORMAL LOW (ref 22–32)
CO2: 23 mmol/L (ref 22–32)
CO2: 23 mmol/L (ref 22–32)
CREATININE: 1.48 mg/dL — AB (ref 0.61–1.24)
Calcium: 9 mg/dL (ref 8.9–10.3)
Calcium: 9.2 mg/dL (ref 8.9–10.3)
Calcium: 9.3 mg/dL (ref 8.9–10.3)
Calcium: 9.5 mg/dL (ref 8.9–10.3)
Chloride: 109 mmol/L (ref 98–111)
Chloride: 110 mmol/L (ref 98–111)
Creatinine, Ser: 1.43 mg/dL — ABNORMAL HIGH (ref 0.61–1.24)
Creatinine, Ser: 1.26 mg/dL — ABNORMAL HIGH (ref 0.61–1.24)
Creatinine, Ser: 1.25 mg/dL — ABNORMAL HIGH (ref 0.61–1.24)
GFR calc Af Amer: 58 mL/min — ABNORMAL LOW (ref >60)
GFR calc Af Amer: >60 mL/min (ref >60)
GFR calc non Af Amer: 50 mL/min — ABNORMAL LOW (ref >60)
GFR calc non Af Amer: 52 mL/min — ABNORMAL LOW (ref >60)
GFR calc non Af Amer: >60 mL/min (ref >60)
GFR calc non Af Amer: >60 mL/min (ref >60)
GLUCOSE: 315 mg/dL — AB (ref 70–99)
Glucose, Bld: 249 mg/dL — ABNORMAL HIGH (ref 70–99)
Glucose, Bld: 121 mg/dL — ABNORMAL HIGH (ref 70–99)
Glucose, Bld: 305 mg/dL — ABNORMAL HIGH (ref 70–99)
Potassium: 4 mmol/L (ref 3.5–5.1)
Potassium: 4.1 mmol/L (ref 3.5–5.1)
Potassium: 4.2 mmol/L (ref 3.5–5.1)
Potassium: 4.4 mmol/L (ref 3.5–5.1)
SODIUM: 139 mmol/L (ref 135–145)
SODIUM: 138 mmol/L (ref 135–145)
Sodium: 137 mmol/L (ref 135–145)
Sodium: 141 mmol/L (ref 135–145)

## 2018-07-05 LAB — RAPID URINE DRUG SCREEN, HOSP PERFORMED
Amphetamines: NOT DETECTED
Barbiturates: NOT DETECTED
Benzodiazepines: NOT DETECTED
Cocaine: NOT DETECTED
Opiates: NOT DETECTED
Tetrahydrocannabinol: NOT DETECTED

## 2018-07-05 LAB — VALPROIC ACID LEVEL: Valproic Acid Lvl: 59 ug/mL (ref 50.0–100.0)

## 2018-07-05 LAB — GLUCOSE, CAPILLARY
Glucose-Capillary: 226 mg/dL — ABNORMAL HIGH (ref 70–99)
Glucose-Capillary: 223 mg/dL — ABNORMAL HIGH (ref 70–99)
Glucose-Capillary: 255 mg/dL — ABNORMAL HIGH (ref 70–99)
Glucose-Capillary: 232 mg/dL — ABNORMAL HIGH (ref 70–99)
Glucose-Capillary: 387 mg/dL — ABNORMAL HIGH (ref 70–99)

## 2018-07-05 LAB — CBG MONITORING, ED
GLUCOSE-CAPILLARY: 289 mg/dL — AB (ref 70–99)
Glucose-Capillary: 258 mg/dL — ABNORMAL HIGH (ref 70–99)
Glucose-Capillary: 245 mg/dL — ABNORMAL HIGH (ref 70–99)
Glucose-Capillary: 166 mg/dL — ABNORMAL HIGH (ref 70–99)
Glucose-Capillary: 100 mg/dL — ABNORMAL HIGH (ref 70–99)
Glucose-Capillary: 89 mg/dL (ref 70–99)

## 2018-07-05 LAB — CBC
HCT: 38 % — ABNORMAL LOW (ref 39.0–52.0)
Hemoglobin: 11.6 g/dL — ABNORMAL LOW (ref 13.0–17.0)
MCH: 28.3 pg (ref 26.0–34.0)
MCHC: 30.5 g/dL (ref 30.0–36.0)
MCV: 92.7 fL (ref 80.0–100.0)
Platelets: 316 10*3/uL (ref 150–400)
RBC: 4.1 MIL/uL — ABNORMAL LOW (ref 4.22–5.81)
RDW: 13.2 % (ref 11.5–15.5)
WBC: 9.6 10*3/uL (ref 4.0–10.5)
nRBC: 0 % (ref 0.0–0.2)

## 2018-07-05 LAB — LITHIUM LEVEL: Lithium Lvl: 0.6 mmol/L (ref 0.60–1.20)

## 2018-07-05 LAB — PHOSPHORUS: Phosphorus: 4.1 mg/dL (ref 2.5–4.6)

## 2018-07-05 LAB — MRSA PCR SCREENING: MRSA BY PCR: NEGATIVE

## 2018-07-05 LAB — HEMOGLOBIN A1C
Hgb A1c MFr Bld: 11.7 % — ABNORMAL HIGH (ref 4.8–5.6)
Mean Plasma Glucose: 289.09 mg/dL

## 2018-07-05 LAB — HIV ANTIBODY (ROUTINE TESTING W REFLEX): HIV Screen 4th Generation wRfx: NONREACTIVE

## 2018-07-05 LAB — MAGNESIUM: Magnesium: 1.8 mg/dL (ref 1.7–2.4)

## 2018-07-05 MED ORDER — LOSARTAN POTASSIUM 50 MG PO TABS
25.0000 mg | ORAL_TABLET | Freq: Every day | ORAL | Status: DC
  Administered 2018-07-05 – 2018-07-10 (×6): 25 mg via ORAL
  Filled 2018-07-05 (×6): qty 1

## 2018-07-05 MED ORDER — SODIUM CHLORIDE 0.9 % IV SOLN
INTRAVENOUS | Status: DC
  Administered 2018-07-05: 10:00:00 via INTRAVENOUS

## 2018-07-05 MED ORDER — INSULIN GLARGINE 100 UNIT/ML ~~LOC~~ SOLN
10.0000 [IU] | Freq: Every day | SUBCUTANEOUS | Status: DC
  Administered 2018-07-05 – 2018-07-06 (×2): 10 [IU] via SUBCUTANEOUS
  Filled 2018-07-05 (×2): qty 0.1

## 2018-07-05 MED ORDER — DIVALPROEX SODIUM 250 MG PO DR TAB: 1500.0000 mg | DELAYED_RELEASE_TABLET | Freq: Every day | ORAL | Status: DC

## 2018-07-05 MED ORDER — DIVALPROEX SODIUM 250 MG PO DR TAB
500.0000 mg | DELAYED_RELEASE_TABLET | Freq: Every morning | ORAL | Status: DC
  Administered 2018-07-06 – 2018-07-10 (×5): 500 mg via ORAL
  Filled 2018-07-05 (×5): qty 2

## 2018-07-05 MED ORDER — INSULIN ASPART 100 UNIT/ML ~~LOC~~ SOLN
0.0000 [IU] | Freq: Three times a day (TID) | SUBCUTANEOUS | Status: DC
  Administered 2018-07-05: 5 [IU] via SUBCUTANEOUS
  Administered 2018-07-06: 3 [IU] via SUBCUTANEOUS

## 2018-07-05 MED ORDER — DIVALPROEX SODIUM 250 MG PO DR TAB: 500.0000 mg | DELAYED_RELEASE_TABLET | Freq: Every morning | ORAL | Status: DC

## 2018-07-05 MED ORDER — POTASSIUM CHLORIDE 10 MEQ/100ML IV SOLN
10.0000 meq | INTRAVENOUS | Status: AC
  Administered 2018-07-05 (×2): 10 meq via INTRAVENOUS
  Filled 2018-07-05 (×2): qty 100

## 2018-07-05 MED ORDER — ATORVASTATIN CALCIUM 10 MG PO TABS
20.0000 mg | ORAL_TABLET | Freq: Every day | ORAL | Status: DC
  Administered 2018-07-06 – 2018-07-10 (×5): 20 mg via ORAL
  Filled 2018-07-05 (×5): qty 2

## 2018-07-05 MED ORDER — DIVALPROEX SODIUM 250 MG PO DR TAB
500.0000 mg | DELAYED_RELEASE_TABLET | Freq: Every morning | ORAL | Status: DC
  Administered 2018-07-05: 500 mg via ORAL
  Filled 2018-07-05: qty 2

## 2018-07-05 MED ORDER — FERROUS SULFATE 325 (65 FE) MG PO TABS
325.0000 mg | ORAL_TABLET | Freq: Every day | ORAL | Status: DC
  Administered 2018-07-05 – 2018-07-10 (×6): 325 mg via ORAL
  Filled 2018-07-05 (×6): qty 1

## 2018-07-05 MED ORDER — HALOPERIDOL 5 MG PO TABS
5.0000 mg | ORAL_TABLET | Freq: Every day | ORAL | Status: DC
  Administered 2018-07-05: 5 mg via ORAL
  Filled 2018-07-05: qty 1

## 2018-07-05 MED ORDER — INSULIN ASPART 100 UNIT/ML ~~LOC~~ SOLN
0.0000 [IU] | Freq: Every day | SUBCUTANEOUS | Status: DC
  Administered 2018-07-05: 5 [IU] via SUBCUTANEOUS

## 2018-07-05 MED ORDER — HALOPERIDOL 5 MG PO TABS: 5.0000 mg | ORAL_TABLET | Freq: Every day | ORAL | Status: DC

## 2018-07-05 MED ORDER — DIVALPROEX SODIUM 250 MG PO DR TAB: 2000.0000 mg | DELAYED_RELEASE_TABLET | Freq: Every day | ORAL | Status: DC

## 2018-07-05 MED ORDER — DIVALPROEX SODIUM 250 MG PO DR TAB
1500.0000 mg | DELAYED_RELEASE_TABLET | Freq: Every day | ORAL | Status: DC
  Administered 2018-07-05 – 2018-07-09 (×5): 1500 mg via ORAL
  Filled 2018-07-05 (×5): qty 6

## 2018-07-05 MED ORDER — INSULIN ASPART 100 UNIT/ML ~~LOC~~ SOLN
0.0000 [IU] | Freq: Three times a day (TID) | SUBCUTANEOUS | Status: DC
  Administered 2018-07-05: 5 [IU] via SUBCUTANEOUS

## 2018-07-05 MED ORDER — ATORVASTATIN CALCIUM 80 MG PO TABS: 80.0000 mg | ORAL_TABLET | Freq: Every day | ORAL | Status: DC

## 2018-07-05 MED ORDER — SODIUM CHLORIDE 0.9 % IV BOLUS: 1000.0000 mL | Freq: Once | INTRAVENOUS | Status: DC

## 2018-07-05 MED ORDER — ATORVASTATIN CALCIUM 80 MG PO TABS
80.0000 mg | ORAL_TABLET | Freq: Every day | ORAL | Status: DC
  Administered 2018-07-05: 80 mg via ORAL
  Filled 2018-07-05: qty 1

## 2018-07-05 NOTE — Progress Notes (Signed)
Inpatient Diabetes Program   AACE/ADA: New Consensus Statement on Inpatient Glycemic Control (2015)  Target Ranges:  Prepandial:   less than 140 mg/dL      Peak postprandial:   less than 180 mg/dL (1-2 hours)      Critically ill patients:  140 - 180 mg/dL    Spoke with patient about diabetes and home regimen for diabetes control. Patient reports that he has a PCP but is looking to change as he thinks he messed up his medications for DM. Patient reports going to see his PCP this week and was just started on Lantus (could not pronounce the name) but could not get the needles to fit on the end.   Patient said he had been on victoza a long while (could not pronounce the name) injecting daily. Patient states he rotates injection site.    Patient reports within the year his A1c has been around 7%. Discussed current A1c 11.7% this admission. Patient reports drinking a lot of soda. He stated " once he starts drinking he keeps going. Because he is addicted to sugar."  Patient reports being under a lot of stress with living situation and family members "making up stories about him."  Patient demonstrated how to operate insulin pen.  Stressed to the patient the importance of improving glycemic control to prevent further complications from uncontrolled diabetes. Discussed impact of nutrition, exercise, stress, sickness, and medications on diabetes control.Patient desires to follow up at one of the clinics at the hospital. Patient has no further questions at this time related to diabetes.   Thanks,  Christena Deem RN, MSN, BC-ADM Inpatient Diabetes Coordinator Team Pager [phone] (8a-5p)

## 2018-07-05 NOTE — ED Notes (Signed)
Per Dr Selena Batten start back on insulin drip. Also go ahead and start D51/2NS instead of NS

## 2018-07-05 NOTE — Progress Notes (Signed)
Family Medicine Teaching Service Daily Progress Note Intern Pager: [phone]  Patient name: Dylan Jordan Medical record number: [id number] Date of birth: [birth year] Age: 63 y.o. Gender: male  Primary Care Provider: Tillman Sers, DO Consultants: none Code Status: full  Pt Overview and Major Events to Date:  Dylan Jordan is a 63 y.o. male admitted for HHS. His chronic conditions include anemia, asthma, schizophrenia, DM, HLD and HTN.    Assessment and Plan:  #HHS on T2DM, improved Likely occurred due to medication nonadherance in setting of being out of his lantus. CBGs 700s>>80s. Improved electrolytes. AMS resolved. Pt eating now. Plan to covert to basal insulin. AMS work up work up negative for alternative causes including CT head w/o acute finding, UA neg for infection, EtOH <10, depakote level wnl, EKG sinus rhythm. Hg A1C 11.7 Mg and Phos wnl. Pt takes at home Lantus 10 units every morning, Victoza at 1.8 mg every morning, glimepiride 4 mg daily. His metformin was recently discontinued  d/c insulin gtt in 2 hrs, give home lantus 10 units  Start SSIs w/ q4h CBGs  D/c D5 IV fluids, switch to mIVF NS  Carb modified/HH diet  BMP in PM to monitor K  AM A1C, Mg, Phos  Diabetes educator  Continue home gabapentin 300 mg 3 times daily  #AKI in setting of CKD, improved Cr 1.99 > 1.4. BL 1.3-1.5. Improved s/p fluid resuscitation  Monitor Cr  Chronic Issues  #Schizophrenia, stable Followed by Vesta Mixer. Patient's mental status at baseline per patient, he is aware that he is slurring, which is worse than baseline. Home haldol injection q 28 days, last dose is unknown. Depakote levels wnl  Continue home Cogentin 2 mg twice daily, Depakote 500 mg a.m., 2000 mg p.m., home lithium 300 mg twice daily with meals Haldol 5 mg nightly.   # Anemia Most recent hemoglobin 10.5 at 1650. Baseline appears to be 11, ranging from 10.4-12.7 in 2019. Expect acute drops in setting  rehydration.   Continue home 325 mg ferrous sulfate.  AM CBC   # Ashtma, mild persistent  Patient reports not taking his home Flovent 2 puffs twice daily. Pt is not wheezing on exam today and denies any SOB.   Home albuterol as needed  #Gout Patient reports that he does not take on colchicine.  Patient does not have any complaints or physical exam findings significant for acute gout flare.  Holding home allopurinol and colchicine  # HLD Most recent lipid panel in November 2019 significant for triglycerides of 352.  Currently followed by Dr. Raymondo [facility] at [facility].  Patient's current calculated ASCVD risk at 41.3%  Increasing atorvastatin to 80 mg  # HTN  Borderline HTN this AM 140/90. Will resume losartan  Holding home losartan 25 mg daily  #GERD  Omeprazole 40 mg nightly   BPH Continue Flomax 0.4 mg daily  FEN/GI: HH/Carb Mod PPx: Lovenox  Disposition: inpatient, progressive  Subjective:  No acute events overnight. Patient feels much better.   Objective: Pulse Rate:  [73-87] 84 (02/21 0743) Resp:  [13-24] 22 (02/21 0743) BP: (109-153)/(66-99) 140/90 (02/21 0743) SpO2:  [96 %-98 %] 98 % (02/21 0743) Physical Exam: Gen: NAD, resting comfortably, baseline slurred speech CV: RRR with no murmurs appreciated Pulm: NWOB, CTAB with no crackles, wheezes, or rhonchi GI: Normal bowel sounds present. Soft, Nontender, Nondistended. MSK: no edema, cyanosis, or clubbing noted Skin: warm, dry Neuro: grossly normal, moves all extremities,     Laboratory: Recent Labs  Lab 07/04/18 1554 07/04/18  1650  WBC 7.0  --   HGB 9.5* 10.5*  HCT 32.0* 31.0*  PLT 247  --    Recent Labs  Lab 07/04/18 1554 07/04/18 1650 07/04/18 2348 07/05/18 0353  NA 125* 128* 137 139  K 6.1* 6.3* 4.4 4.0  CL 97*  --  109 108  CO2 21*  --  21* 23  BUN 13  --  14 13  CREATININE 1.99*  --  1.48* 1.43*  CALCIUM 8.7*  --  9.0 9.5  GLUCOSE 767*  --  315* 249*     Imaging/Diagnostic  Tests: Ct Head Wo Contrast  Result Date: 07/04/2018 CLINICAL DATA:  Slurred speech and lethargy. EXAM: CT HEAD WITHOUT CONTRAST TECHNIQUE: Contiguous axial images were obtained from the base of the skull through the vertex without intravenous contrast. COMPARISON:  05/20/2017 FINDINGS: Brain: Ventricles and cisterns are within normal. Mild age related atrophic change. Subtle chronic ischemic microvascular disease. No mass, mass effect, shift of midline structures or acute hemorrhage. No evidence of acute infarction. Vascular: No hyperdense vessel or unexpected calcification. Skull: Normal. Negative for fracture or focal lesion. Sinuses/Orbits: No acute finding. Other: None. IMPRESSION: No acute findings. Mild age related atrophic change and chronic ischemic microvascular disease. Electronically Signed   By: Elberta Fortis M.D.   On: 07/04/2018 18:03   Dg Chest Portable 1 View  Result Date: 07/04/2018 CLINICAL DATA:  Hypoglycemia. EXAM: PORTABLE CHEST 1 VIEW COMPARISON:  08/24/2017 FINDINGS: Lungs are hypoinflated with slight elevation of the right hemidiaphragm. No focal lobar consolidation or effusion. Cardiomediastinal silhouette and remainder of the exam is unchanged. IMPRESSION: Hypoinflation without acute cardiopulmonary disease. Electronically Signed   By: Elberta Fortis M.D.   On: 07/04/2018 17:06    Garnette Gunner, MD 07/05/2018, 8:10 AM PGY-2, [facility] Family Medicine FPTS Intern pager: [phone], text pages welcome

## 2018-07-05 NOTE — Progress Notes (Signed)
Patient arrived from the ED in stretcher with insulin drip infusing. Patient oriented room. Skin intact. Patient handoff.

## 2018-07-05 NOTE — ED Notes (Signed)
Breakfast ordered- carb mod 

## 2018-07-05 NOTE — Telephone Encounter (Signed)
Will let inpatient team know patient has questions.

## 2018-07-05 NOTE — ED Notes (Addendum)
Turned insulin drip down to .1U/hr per verbal order

## 2018-07-05 NOTE — ED Notes (Signed)
Pt eating breakfast 

## 2018-07-05 NOTE — ED Notes (Signed)
Attempted to call report x 1  

## 2018-07-05 NOTE — Telephone Encounter (Signed)
Patient currently admitted. Would like you to talk to the endocrinologist on the 5th floor in charge of him.  Ples Specter, RN [facility] RN)

## 2018-07-05 NOTE — Progress Notes (Signed)
Inpatient Diabetes Program Recommendations  AACE/ADA: New Consensus Statement on Inpatient Glycemic Control (2015)  Target Ranges:  Prepandial:   less than 140 mg/dL      Peak postprandial:   less than 180 mg/dL (1-2 hours)      Critically ill patients:  140 - 180 mg/dL    Review of Glycemic Control  Diabetes history: DM 2 Outpatient Diabetes medications: Amaryl 4 mg Daily, Victoza 1.8 mg Daily Current orders for Inpatient glycemic control: IV insulin transitioning  Inpatient Diabetes Program Recommendations:    Glucose 767 on admission. A1c 11.7%. based on weight and renal function would consider Lantus 15 units even though patient is on Lantus 10 at home.   Will see patient today for education and inquire about A1c level.  Thanks,  Christena Deem RN, MSN, BC-ADM Inpatient Diabetes Coordinator Team Pager [phone] (8a-5p)

## 2018-07-06 ENCOUNTER — Encounter (HOSPITAL_COMMUNITY): Payer: Self-pay | Admitting: Family Medicine

## 2018-07-06 DIAGNOSIS — D509 Iron deficiency anemia, unspecified: Secondary | ICD-10-CM

## 2018-07-06 DIAGNOSIS — F200 Paranoid schizophrenia: Secondary | ICD-10-CM | POA: Diagnosis present

## 2018-07-06 DIAGNOSIS — F209 Schizophrenia, unspecified: Secondary | ICD-10-CM

## 2018-07-06 DIAGNOSIS — N3946 Mixed incontinence: Secondary | ICD-10-CM

## 2018-07-06 DIAGNOSIS — E7800 Pure hypercholesterolemia, unspecified: Secondary | ICD-10-CM

## 2018-07-06 HISTORY — DX: Schizophrenia, unspecified: F20.9

## 2018-07-06 LAB — CBC
HCT: 34.8 % — ABNORMAL LOW (ref 39.0–52.0)
Hemoglobin: 10.6 g/dL — ABNORMAL LOW (ref 13.0–17.0)
MCH: 28.4 pg (ref 26.0–34.0)
MCHC: 30.5 g/dL (ref 30.0–36.0)
MCV: 93.3 fL (ref 80.0–100.0)
Platelets: 299 10*3/uL (ref 150–400)
RBC: 3.73 MIL/uL — ABNORMAL LOW (ref 4.22–5.81)
RDW: 13.2 % (ref 11.5–15.5)
WBC: 7.4 10*3/uL (ref 4.0–10.5)
nRBC: 0 % (ref 0.0–0.2)

## 2018-07-06 LAB — BASIC METABOLIC PANEL
Anion gap: 7 (ref 5–15)
BUN: 10 mg/dL (ref 8–23)
CO2: 23 mmol/L (ref 22–32)
Calcium: 9.2 mg/dL (ref 8.9–10.3)
Chloride: 112 mmol/L — ABNORMAL HIGH (ref 98–111)
Creatinine, Ser: 1.15 mg/dL (ref 0.61–1.24)
GFR calc Af Amer: >60 mL/min (ref >60)
GFR calc non Af Amer: >60 mL/min (ref >60)
Glucose, Bld: 197 mg/dL — ABNORMAL HIGH (ref 70–99)
Potassium: 4 mmol/L (ref 3.5–5.1)
Sodium: 142 mmol/L (ref 135–145)

## 2018-07-06 LAB — GLUCOSE, CAPILLARY
Glucose-Capillary: 189 mg/dL — ABNORMAL HIGH (ref 70–99)
Glucose-Capillary: 268 mg/dL — ABNORMAL HIGH (ref 70–99)
Glucose-Capillary: 385 mg/dL — ABNORMAL HIGH (ref 70–99)
Glucose-Capillary: 375 mg/dL — ABNORMAL HIGH (ref 70–99)

## 2018-07-06 MED ORDER — LIRAGLUTIDE 18 MG/3ML ~~LOC~~ SOPN: 1.8000 mg | PEN_INJECTOR | Freq: Every day | SUBCUTANEOUS | Status: DC

## 2018-07-06 MED ORDER — INSULIN GLARGINE 100 UNIT/ML ~~LOC~~ SOLN
20.0000 [IU] | Freq: Every day | SUBCUTANEOUS | Status: DC
  Administered 2018-07-07: 20 [IU] via SUBCUTANEOUS
  Filled 2018-07-06: qty 0.2

## 2018-07-06 MED ORDER — INSULIN ASPART 100 UNIT/ML ~~LOC~~ SOLN
10.0000 [IU] | Freq: Once | SUBCUTANEOUS | Status: AC
  Administered 2018-07-06: 10 [IU] via SUBCUTANEOUS

## 2018-07-06 MED ORDER — ALLOPURINOL 100 MG PO TABS
200.0000 mg | ORAL_TABLET | Freq: Every day | ORAL | Status: DC
  Administered 2018-07-06 – 2018-07-10 (×5): 200 mg via ORAL
  Filled 2018-07-06 (×5): qty 2

## 2018-07-06 MED ORDER — INSULIN ASPART 100 UNIT/ML ~~LOC~~ SOLN
0.0000 [IU] | Freq: Three times a day (TID) | SUBCUTANEOUS | Status: DC
  Administered 2018-07-07: 8 [IU] via SUBCUTANEOUS
  Administered 2018-07-07: 2 [IU] via SUBCUTANEOUS
  Administered 2018-07-07: 5 [IU] via SUBCUTANEOUS
  Administered 2018-07-08: 8 [IU] via SUBCUTANEOUS
  Administered 2018-07-08: 11 [IU] via SUBCUTANEOUS
  Administered 2018-07-08 – 2018-07-09 (×2): 8 [IU] via SUBCUTANEOUS
  Administered 2018-07-09 (×2): 5 [IU] via SUBCUTANEOUS
  Administered 2018-07-10: 1 [IU] via SUBCUTANEOUS
  Administered 2018-07-10: 5 [IU] via SUBCUTANEOUS

## 2018-07-06 MED ORDER — INSULIN GLARGINE 100 UNIT/ML ~~LOC~~ SOLN
10.0000 [IU] | Freq: Once | SUBCUTANEOUS | Status: AC
  Administered 2018-07-06: 10 [IU] via SUBCUTANEOUS
  Filled 2018-07-06: qty 0.1

## 2018-07-06 MED ORDER — HALOPERIDOL 5 MG PO TABS
5.0000 mg | ORAL_TABLET | Freq: Every day | ORAL | Status: DC
  Administered 2018-07-06 – 2018-07-09 (×4): 5 mg via ORAL
  Filled 2018-07-06 (×5): qty 1

## 2018-07-06 NOTE — Plan of Care (Signed)
Discussed plan of care for the evening, pain management, voiding frequency and watching his blood glucose levels with snacks with some teach back displayed

## 2018-07-06 NOTE — Plan of Care (Signed)
Discussed with patient plan of care for the evening, pain management and what rehab would be like with some teach back displayed.  Patient is a fluctuating high blood glucose diabetic with IV which is NSL left in for emergency and patient preference.  Patient as well uses depends at home and has concerns about being incontinent and requested to leave on condom catheter at this time.

## 2018-07-06 NOTE — Progress Notes (Addendum)
Family Medicine Teaching Service Daily Progress Note Intern Pager: [phone]  Patient name: Dylan Jordan Medical record number: [id number] Date of birth: 10/07/1955 Age: 63 y.o. Gender: male  Primary Care Provider: Tillman Sers, DO Consultants: DM coordinator  Code Status: Full Code   Pt Overview and Major Events to Date:  Admitted: 07/04/2018 for CC: Hyperglycemia  Hospital Day: 3  Assessment and Plan: Dylan Jordan is a 63 y.o. male admitted for HHS. He  has a past medical history of Anemia, Asthma, Diabetes mellitus without complication, Gout, Hyperlipidemia, Hypertension, and Schizophrenia   # T2DM, poorly controlled  HHS, resolved Fasting glucose 305 this morning. Pt took received 10 units lantus yesterday at 10A. Glucose 226 > 223 > 255 > 232 > 387. Pt is not a candidate for complicated insulin regimen at home. Will try to simplify insulin regimen; however, patient's sugars here will not accurately reflect those at home when his diet and schedule are very different. Patient will need very close follow up outpatient.   Increase lantus to 20  Received 10 u this AM and will dose 10 u for one time dose now  Restart home victoza  DC Novolog TID WC and WHS   Q8H CBG  PRN gabapentin   Encourage patient for OOB   #AKI  resolved  125>1.15 today   #Schizophrenia Pt's PM depakote correct to 1,500 (previously & incorrectly ordered as 2,000mg  PM).   Continue home 500mg  AM & 1,500mg  PM   #Anemia  Stable  Continue 325 mg Ferrous sulfate.  #Asthma  PRN albuterol  #Gout  Restart home allopurinol  #HLD  Increased atorvastatin to 80mg  at admission   #HTN 137/91. Creatinine wnl.   Restart home losartan  #GERD  PTX   #BPH  Continue home flomax 0.4mg  daily   Called RN to DC condom cath   #FEN/GI:  . Fluids: saline lock   . Electrolytes: wnl . Nutrition: Heart Healthy/Carb Modified    Access: d/c PIV VTE prophylaxis: Lovenox (prophylaxtic  dose)  Disposition:   Transfer to med surg, d/c cardiac monitoring   Plans for D/C tomorrow  F/U appt at [facility] - 2/24 @  9:10AM    Subjective:  NAEO.   Objective: BP (!) 137/91 (BP Location: Right Arm)   Pulse 72   Temp 98.6 F (37 C)   Resp 16   Ht 6\' 1"  (1.854 m)   Wt 99.5 kg   SpO2 100%   BMI 28.94 kg/m   Intake/Output      02/21 0701 - 02/22 0700 02/22 0701 - 02/23 0700   P.O. 1860    I.V. (mL/kg) 2389.9 (24)    IV Piggyback 0    Total Intake(mL/kg) 4249.9 (42.7)    Urine (mL/kg/hr) 1700 (0.7)    Total Output 1700    Net +2549.9         Urine Occurrence 6 x    Stool Occurrence 1 x     Physical Exam: Gen: NAD, alert, non-toxic, well-appearing, sitting comfortably. Foley bag hanging off of side of bed  Skin: Warm and dry.  HEENT: NCAT.  MMM.  CV: RRR.  Normal S1-S2. No BLEE. Resp: CTAB. No increased WOB Abd: NTND. Pos bowel sounds Extremities: Warm and well perfused.  Neuro: speech mildly slurred, but mentating well. No FND  Laboratory: I have personally read and reviewed all labs and imaging studies.  Trending Labs: Hemoglobin stable, Na 138, K 4.2, Cr 1.25 stable from 1.26 yesterday  Hgb A1C 11.7  Imaging/Diagnostic Tests: No New imaging    Melene Plan, MD 07/06/2018, 7:04 AM PGY-1, [facility] Family Medicine FPTS Intern pager: [phone], text pages welcome

## 2018-07-07 DIAGNOSIS — I10 Essential (primary) hypertension: Secondary | ICD-10-CM

## 2018-07-07 LAB — BASIC METABOLIC PANEL
Anion gap: 9 (ref 5–15)
BUN: 11 mg/dL (ref 8–23)
CO2: 21 mmol/L — ABNORMAL LOW (ref 22–32)
Calcium: 9.6 mg/dL (ref 8.9–10.3)
Chloride: 111 mmol/L (ref 98–111)
Creatinine, Ser: 1.28 mg/dL — ABNORMAL HIGH (ref 0.61–1.24)
GFR calc Af Amer: >60 mL/min (ref >60)
GFR calc non Af Amer: 60 mL/min — ABNORMAL LOW (ref >60)
Glucose, Bld: 148 mg/dL — ABNORMAL HIGH (ref 70–99)
Potassium: 3.9 mmol/L (ref 3.5–5.1)
Sodium: 141 mmol/L (ref 135–145)

## 2018-07-07 LAB — GLUCOSE, CAPILLARY
GLUCOSE-CAPILLARY: 150 mg/dL — AB (ref 70–99)
GLUCOSE-CAPILLARY: 132 mg/dL — AB (ref 70–99)
Glucose-Capillary: 227 mg/dL — ABNORMAL HIGH (ref 70–99)
Glucose-Capillary: 254 mg/dL — ABNORMAL HIGH (ref 70–99)
Glucose-Capillary: 225 mg/dL — ABNORMAL HIGH (ref 70–99)

## 2018-07-07 MED ORDER — INSULIN GLARGINE 100 UNIT/ML ~~LOC~~ SOLN
25.0000 [IU] | Freq: Every day | SUBCUTANEOUS | Status: DC
  Administered 2018-07-08: 25 [IU] via SUBCUTANEOUS
  Filled 2018-07-07: qty 0.25

## 2018-07-07 NOTE — Progress Notes (Signed)
Family Medicine Teaching Service Daily Progress Note Intern Pager: [phone]  Patient name: Dylan Jordan Medical record number: [id number] Date of birth: 10-07-1955 Age: 63 y.o. Gender: male  Primary Care Provider: Tillman Sers, DO Consultants: DM coordinator  Code Status: Full Code   Pt Overview and Major Events to Date:  Admitted: 07/04/2018 for CC: Hyperglycemia  Hospital Day: 4  Assessment and Plan: Dylan Jordan is a 63 y.o. male admitted for HHS. He  has a past medical history of Anemia, Asthma, Diabetes mellitus without complication, Gout, Hyperlipidemia, Hypertension, and Schizophrenia   # T2DM, poorly controlled  HHS, resolved: Glucose largely ranging between 150- upper 300s.  Required 13 units of SSI yesterday.  Not a candidate for a complicated insulin regimen at home, concern for his compliance and ability to administer this on his own.  May benefit from home health or SNF placement for further diabetes education and management assistance.  - Increase Lantus to 25 units - Continue home Victoza (has not been given yet today)  - Continue moderate SSI - Monitor CBGs with meals and at bedtime - Monitor BMP, vitals - Continue home gabapentin 300 mg 3 times daily as needed - PT/OT evaluation  #Elevated creatinine: CR 1.28 today, from 1.15.  Likely in the setting of dehydration with HHS. - Encourage hydration -Monitor BMP in the a.m.  #Schizophrenia Follows with [name].  Mental status at baseline per patient, somewhat tangential and forgetful during conversation.  Continue home 500mg  AM & 1,500mg  PM Depakote  #Anemia  Stable  Continue 325 mg Ferrous sulfate.  #Asthma  PRN albuterol  #Gout  Restart home allopurinol  #HLD  Increased atorvastatin to 80mg  at admission   #HTN  WNL.  Cont home losartan  #GERD  PTX   #BPH  Continue home flomax 0.4mg  daily   Dc condom cath  #FEN/GI:  . Fluids: saline lock   . Electrolytes: wnl . Nutrition:  Heart Healthy/Carb Modified    Access: d/c PIV VTE prophylaxis: Lovenox (prophylaxtic dose)  Disposition:  Continued inpatient care, PT/OT evaluations    Subjective:  No acute events overnight.  States he is doing well this morning, feeling better.  Continues to be concerned about his diabetes, states he is not sure how to use his medications.  He lives alone in an apartment, has 2 nieces in town but occasionally can help, otherwise does not have any additional help around the house.  Denies any chest pain, shortness of breath, abdominal pains.  Objective: BP 125/88 (BP Location: Right Arm)   Pulse 64   Temp 98.9 F (37.2 C) (Oral)   Resp 18   Ht 6\' 1"  (1.854 m)   Wt 99.5 kg   SpO2 95%   BMI 28.94 kg/m   Intake/Output      02/22 0701 - 02/23 0700 02/23 0701 - 02/24 0700   P.O. 1120    I.V. (mL/kg)     IV Piggyback     Total Intake(mL/kg) 1120 (11.3)    Urine (mL/kg/hr) 4675 (2)    Stool 0    Total Output 4675    Net -3555         Urine Occurrence 1 x    Stool Occurrence 1 x     Physical Exam: General: Alert, NAD, laying down comfortably HEENT: NCAT, MMM  Cardiac: RRR  Lungs: Clear bilaterally, no increased WOB  Abdomen: soft, non-tender, non-distended, normoactive BS Msk: Moves all extremities spontaneously  Ext: Warm, dry, 2+ distal pulses Neuro: No focal  neuro deficits noted, speech appropriate Psych: Somewhat tangential   Imaging/Diagnostic Tests: No New imaging    Dylan Stack, DO 07/07/2018, 1:02 PM PGY-1, [facility] Family Medicine FPTS Intern pager: [phone], text pages welcome

## 2018-07-07 NOTE — Discharge Summary (Signed)
[facility] Discharge Summary  Patient name: Dylan Jordan Medical record number: [id number] Date of birth: [birth year] Age: 63 y.o. Gender: male Date of Admission: 07/04/2018  Date of Discharge: 07/10/18 Admitting Physician: Wilber Oliphant, MD  Primary Care Provider: [name], DO Consultants: DM coordinator   Indication for Hospitalization: HHS  Discharge Diagnoses/Problem List:  T2DM, poorly controlled, HHS resolved AKI Schizophrenia Anemia Asthma Gout HLD HTN GERD BPH  Disposition: SNF   Discharge Condition: Stable  Discharge Exam:  BP (!) 129/92 (BP Location: Left Arm)   Pulse 73   Temp 98.8 F (37.1 C) (Oral)   Resp 18   Ht _0  (1.854 m)   Wt 99.5 kg   SpO2 100%   BMI 28.94 kg/m  Gen: NAD, alert, non-toxic, well-appearing, sitting comfortably  Skin: Warm and dry.  HEENT: NCAT.  MMM.  CV: RRR.  Normal S1-S2. No BLEE. Resp: CTAB. No increased WOB Abd: NTND. Pos bowel sounds Extremities: Warm and well perfused.  Gait: small steps and stable. No wobbling    Brief Hospital Course:  Dylan Jordan is a 63 y.o. male presenting for HHS in the setting of medication non-compliance. On presentation patient had AMS and was hyperglycemic to 767. Per patient, he was not taking medications and also had a broken lantus pen at home. CT head negative on admission. Patient improved in mental status after fluid hydration and glucose correction with insulin gtt. Once CBGs re-equilibrated patient was transitioned to lantus and tolerated diet well.  Patient did not seem to have any confusion about which foods he could and could not have. He was able to voice what foods/drink were bad for him; however, patient continued to ask for extra servings of juice and pudding from the kitchen and floor staff. He reported that it was hard for him to stay away from these things because he wanted them. He reported that he would continue to work on this.  Given  history of noncompliance, primary team attempted to simply regimen as much as possible to encourage adherence. This included taking off meal time and sliding scale. On discharge, daily diabetes care directions as follows:  1. Check blood sugars  2. Take victoza (which was to be titrated up as patient had not been taking it during admission) 3. Take 35 units lantus  If patient had blood sugars <120, patient to decrease lantus to 30 units and call Dr. [name].  Patient to call Dr. [name] on Friday 2/28 and leave message with nurse about CBG #.   OTHER Admission Issues:   During admission patient found to have AKI which improved with fluid hydration. Cr on a few days prior to discharge was 1.35, it was not remeasured as it was stable.   Additionally, the combination of urinary retention and incontinence was concerning.  Upon further history, symptoms were consistent with worsening BPH.  A post void residual scan showed 875 cc in the bladder.  Patient did not complain of any pain or discomfort at that time.  PSA was obtained and was normal.  Flomax was increased to 0.8 mg and finasteride was started.  It should be noted that patient is also on Haldol, which can cause urinary retention.  If there is no improvement in urinary retention, finasteride can be pulled off to avoid polypharmacy.  During admission safe disposition was a concern for patient given history of non-compliance and possible inability to administer insulin. SNF was recommended by PT; however, patient declined SNF  and was discharged home with home health PT, RN, aide. He was discharged home with rolling walker.  Issues for Follow Up:  1. Ensure compliance with diabetic regimen: Patient provided with calendar for daily diabetic regimen  2. Monitor urinary retention - D/C finasteride if not helpful.   Significant Procedures: non   Significant Labs and Imaging:  Recent Labs  Lab 07/05/18 1400 07/06/18 0336 07/08/18 0400  WBC 9.6 7.4  7.4  HGB 11.6* 10.6* 10.5*  HCT 38.0* 34.8* 35.0*  PLT 316 299 291   Recent Labs  Lab 07/04/18 2348  07/05/18 0815 07/05/18 1400 07/06/18 0336 07/07/18 0427 07/08/18 0400  NA 137   < > 141 138 142 141 135  K 4.4   < > 4.1 4.2 4.0 3.9 4.4  CL 109   < > 111 110 112* 111 105  CO2 21*   < > 19* 23 23 21* 23  GLUCOSE 315*   < > 121* 305* 197* 148* 371*  BUN 14   < > _0 CREATININE 1.48*   < > 1.26* 1.25* 1.15 1.28* 1.35*  CALCIUM 9.0   < > 9.2 9.3 9.2 9.6 9.3  MG 1.8  --   --   --   --   --   --   PHOS 4.1  --   --   --   --   --   --    < > = values in this interval not displayed.   CBG (last 3)  Recent Labs    07/09/18 2155 07/10/18 0813 07/10/18 1216  GLUCAP 124* 124* 229*   Urinalysis    Component Value Date/Time   COLORURINE STRAW (A) 07/08/2018 0817   APPEARANCEUR CLEAR 07/08/2018 0817   LABSPEC 1.005 07/08/2018 0817   PHURINE 6.0 07/08/2018 0817   GLUCOSEU >=500 (A) 07/08/2018 0817   HGBUR NEGATIVE 07/08/2018 0817   BILIRUBINUR NEGATIVE 07/08/2018 0817   BILIRUBINUR negative 01/03/2018 [place] 07/08/2018 0817   PROTEINUR NEGATIVE 07/08/2018 0817   UROBILINOGEN 0.2 01/03/2018 1445   NITRITE NEGATIVE 07/08/2018 0817   LEUKOCYTESUR NEGATIVE 07/08/2018 0817   Ct Head Wo Contrast  Result Date: 07/04/2018 CLINICAL DATA:  Slurred speech and lethargy. EXAM: CT HEAD WITHOUT CONTRAST TECHNIQUE: Contiguous axial images were obtained from the base of the skull through the vertex without intravenous contrast. COMPARISON:  05/20/2017 FINDINGS: Brain: Ventricles and cisterns are within normal. Mild age related atrophic change. Subtle chronic ischemic microvascular disease. No mass, mass effect, shift of midline structures or acute hemorrhage. No evidence of acute infarction. Vascular: No hyperdense vessel or unexpected calcification. Skull: Normal. Negative for fracture or focal lesion. Sinuses/Orbits: No acute finding. Other: None. IMPRESSION: No  acute findings. Mild age related atrophic change and chronic ischemic microvascular disease. Electronically Signed   By: Marin Olp M.D.   On: 07/04/2018 18:03   Dg Chest Portable 1 View  Result Date: 07/04/2018 CLINICAL DATA:  Hypoglycemia. EXAM: PORTABLE CHEST 1 VIEW COMPARISON:  08/24/2017 FINDINGS: Lungs are hypoinflated with slight elevation of the right hemidiaphragm. No focal lobar consolidation or effusion. Cardiomediastinal silhouette and remainder of the exam is unchanged. IMPRESSION: Hypoinflation without acute cardiopulmonary disease. Electronically Signed   By: Marin Olp M.D.   On: 07/04/2018 17:06    Results/Tests Pending at Time of Discharge:  None  Discharge Medications:  Allergies as of 07/10/2018      Reactions   Chantix [varenicline Tartrate] Other (See Comments)  Nightmares   Ativan [lorazepam] Other (See Comments)   Agitation (and "5 others like it") Will need to call [facility]. [facility] in [place] (phone) [phone] and [phone] to fax a release-   Shellfish-derived Products Itching   Penicillins Diarrhea   Has patient had a PCN reaction causing immediate rash, facial/tongue/throat swelling, SOB or lightheadedness with hypotension: No Has patient had a PCN reaction causing severe rash involving mucus membranes or skin necrosis: No Has patient had a PCN reaction that required hospitalization: No Has patient had a PCN reaction occurring within the last 10 years: No If all of the above answers are "NO", then may proceed with Cephalosporin use.      Medication List    STOP taking these medications   Insulin Glargine 100 UNIT/ML Solostar Pen Commonly known as:  LANTUS SOLOSTAR Replaced by:  insulin glargine 100 UNIT/ML injection     TAKE these medications   ACCU-CHEK AVIVA PLUS w/Device Kit Use to check blood sugar three times daily or as directed.   ACCU-CHEK SOFTCLIX LANCETS lancets use as directed *may use THREE TIMES DAILY to check blood  sugar*   acetaminophen 500 MG tablet Commonly known as:  TYLENOL Take 1,000 mg by mouth every 6 (six) hours as needed (for pain).   albuterol 108 (90 Base) MCG/ACT inhaler Commonly known as:  PROVENTIL HFA;VENTOLIN HFA Inhale 1-2 puffs into the lungs every 6 (six) hours as needed for wheezing or shortness of breath.   allopurinol 100 MG tablet Commonly known as:  ZYLOPRIM Take 2 tablets (200 mg total) by mouth daily.   atorvastatin 20 MG tablet Commonly known as:  LIPITOR Take 1 tablet (20 mg total) by mouth daily.   benztropine 2 MG tablet Commonly known as:  COGENTIN Take 2 mg by mouth 2 (two) times daily.   DAILY-VITE Tabs TAKE 1 TABLET BY MOUTH DAILY.   divalproex 500 MG DR tablet Commonly known as:  DEPAKOTE Take 500-1,500 mg by mouth See admin instructions. Take 500 mg by mouth in the morning and 1,500 mg in the evening   ferrous sulfate 325 (65 FE) MG tablet Commonly known as:  FERROUSUL Take 1 tablet (325 mg total) by mouth daily with breakfast.   finasteride 5 MG tablet Commonly known as:  PROSCAR Take 1 tablet (5 mg total) by mouth daily. Start taking on:  July 11, 2018   fluticasone 110 MCG/ACT inhaler Commonly known as:  FLOVENT HFA Inhale 2 puffs into the lungs 2 (two) times daily.   gabapentin 300 MG capsule Commonly known as:  NEURONTIN Take 1 capsule (300 mg total) by mouth 3 (three) times daily as needed. What changed:    how much to take  when to take this  additional instructions   glimepiride 4 MG tablet Commonly known as:  AMARYL Take 4 mg by mouth daily.   glucose blood test strip Commonly known as:  ACCU-CHEK GUIDE Use to check blood sugar three times daily or as directed.   haloperidol 5 MG tablet Commonly known as:  HALDOL Take 5 mg by mouth at bedtime.   haloperidol decanoate 100 MG/ML injection Commonly known as:  HALDOL DECANOATE Inject 200 mg into the muscle every 28 (twenty-eight) days.   insulin glargine 100  UNIT/ML injection Commonly known as:  LANTUS Inject 0.35 mLs (35 Units total) into the skin daily. Start taking on:  July 11, 2018 Replaces:  Insulin Glargine 100 UNIT/ML Solostar Pen   lidocaine 5 % Commonly known as:  LIDODERM Place 1 patch onto the skin daily for 30 doses. Remove & Discard patch within 12 hours or as directed by MD   liraglutide 18 MG/3ML Sopn Commonly known as:  VICTOZA Inject 0.3 mLs (1.8 mg total) into the skin daily. What changed:  when to take this   lithium carbonate 300 MG capsule Take 300 mg by mouth 2 (two) times daily with a meal.   losartan 25 MG tablet Commonly known as:  COZAAR Take 1 tablet (25 mg total) by mouth daily.   omeprazole 20 MG capsule Commonly known as:  PRILOSEC Take 2 capsules (40 mg total) by mouth every evening.   Pen Needles 32G X 5 MM Misc 1 pen by Does not apply route daily.   NOVOFINE PLUS 32G X 4 MM Misc Generic drug:  Insulin Pen Needle 1 Container by Does not apply route daily.   tamsulosin 0.4 MG Caps capsule Commonly known as:  FLOMAX Take 2 capsules (0.8 mg total) by mouth daily. Start taking on:  July 11, 2018 What changed:    how much to take  when to take this            Durable Medical Equipment  (From admission, onward)         Start     Ordered   07/10/18 1101  For home use only DME Walker rolling  Once    Question:  Patient needs a walker to treat with the following condition  Answer:  Abnormal gait   07/10/18 1100          Discharge Instructions: Please refer to Patient Instructions section of EMR for full details.  Patient was counseled important signs and symptoms that should prompt return to medical care, changes in medications, dietary instructions, activity restrictions, and follow up appointments.   Follow-Up Appointments: Follow-up Information    [name], DO. Go on 07/26/2018.   Specialty:  Family Medicine Why:  _0 :10PM (please arrive at least 15 min  early) Contact information: [place] [address] [phone]        [facility] Follow up.   Specialty:  Home Health Services Why:  home health services arranged Contact information: [place] [address] [place] Follow up.   Why:  rolling walker will be delivered to bedside prior to discharge Contact information: [place] [address] [phone]           Wilber Oliphant, MD 07/10/2018, 4:41 PM PGY-1, [facility]

## 2018-07-07 NOTE — Evaluation (Signed)
Physical Therapy Evaluation Patient Details Name: Dylan Jordan MRN: [id number] DOB: 1955/11/17 Today's Date: 07/07/2018   History of Present Illness  63 y.o. male admitted on 07/04/18 for hypomoslar hyperglycemic state (HHS) with blood glucose in the 700s and acute kidney injury.  Pt with other significant PMH of schizophrenia, DM, anemia, asthma, HTN, gout, L TKA, toe surgery.    Clinical Impression  Pt with significant gait difficulty presenting with slow, shuffling pattern.  He refuses to use a RW and thinks that  Quad cane is best for him (we practiced with a quad cane and he needed significant cueing and it did not help make him more stable).  At this time, he would be safer with a RW.  He needs significant gait and mobility as well as diabetic management education/training.  I recommend post acute rehab at this time.   PT to follow acutely for deficits listed below.      Follow Up Recommendations SNF    Equipment Recommendations  Rolling walker with 5" wheels;3in1 (PT)    Recommendations for Other Services OT consult     Precautions / Restrictions Precautions Precautions: Fall Precaution Comments: pt reports recent h/o falling      Mobility  Bed Mobility Overal bed mobility: Modified Independent             General bed mobility comments: extra time and railing used.   Transfers Overall transfer level: Needs assistance Equipment used: Quad cane Transfers: Sit to/from Stand Sit to Stand: Min guard         General transfer comment: Min guard assist for safety during transition, reliance on lower legs stabilized on the bed to come to standing.   Ambulation/Gait Ambulation/Gait assistance: Min assist Gait Distance (Feet): 20 Feet Assistive device: Quad cane Gait Pattern/deviations: Step-to pattern;Shuffle;Trunk flexed;Narrow base of support Gait velocity: decreased Gait velocity interpretation: <1.31 ft/sec, indicative of household ambulator General Gait  Details: Pt with narrow, shuffling gait pattern, flexed posture, and needs significant cues to use quad cane safely.  He did not want to try the straight cane I brought him and refused to use the RW which at this time would be his safest assistive device.           Balance Overall balance assessment: Needs assistance Sitting-balance support: Feet supported;No upper extremity supported Sitting balance-Leahy Scale: Good     Standing balance support: Bilateral upper extremity supported;Single extremity supported;No upper extremity supported Standing balance-Leahy Scale: Poor Standing balance comment: needs external support.                              Pertinent Vitals/Pain Pain Assessment: No/denies pain    Home Living Family/patient expects to be discharged to:: Private residence Living Arrangements: Alone   Type of Home: Apartment Home Access: Stairs to enter Entrance Stairs-Rails: None Entrance Stairs-Number of Steps: 1(curb step) Home Layout: One level Home Equipment: Cane - single point      Prior Function Level of Independence: Needs assistance   Gait / Transfers Assistance Needed: Pt reports his cane is too short, he would like a quad cane, and is currently refusing to use a RW (which is what he really needs).             Hand Dominance   Dominant Hand: Right    Extremity/Trunk Assessment   Upper Extremity Assessment Upper Extremity Assessment: Defer to OT evaluation    Lower Extremity Assessment Lower Extremity Assessment:  Generalized weakness(grossly 4/5, bil LE edema, sensation intact to LT.  )    Cervical / Trunk Assessment Cervical / Trunk Assessment: Normal  Communication   Communication: No difficulties  Cognition Arousal/Alertness: Awake/alert Behavior During Therapy: WFL for tasks assessed/performed Overall Cognitive Status: No family/caregiver present to determine baseline cognitive functioning                                  General Comments: Pt has decreased health literacy and decreased awareness of his deficits and how that relates to the safest AD for him to use.               Assessment/Plan    PT Assessment Patient needs continued PT services  PT Problem List Decreased strength;Decreased activity tolerance;Decreased balance;Decreased mobility;Decreased safety awareness;Decreased knowledge of use of DME;Decreased knowledge of precautions       PT Treatment Interventions Gait training;DME instruction;Stair training;Functional mobility training;Therapeutic activities;Therapeutic exercise;Balance training;Neuromuscular re-education;Cognitive remediation;Patient/family education    PT Goals (Current goals can be found in the Care Plan section)  Acute Rehab PT Goals Patient Stated Goal: to get better at walking (stop shuffling).  PT Goal Formulation: With patient Time For Goal Achievement: 07/21/18 Potential to Achieve Goals: Good    Frequency Min 3X/week   Barriers to discharge Decreased caregiver support lives alone       AM-PAC PT "6 Clicks" Mobility  Outcome Measure Help needed turning from your back to your side while in a flat bed without using bedrails?: A Little Help needed moving from lying on your back to sitting on the side of a flat bed without using bedrails?: A Little Help needed moving to and from a bed to a chair (including a wheelchair)?: A Little Help needed standing up from a chair using your arms (e.g., wheelchair or bedside chair)?: A Little Help needed to walk in hospital room?: A Little Help needed climbing 3-5 steps with a railing? : A Little 6 Click Score: 18    End of Session Equipment Utilized During Treatment: Gait belt Activity Tolerance: Patient tolerated treatment well Patient left: in bed;with call bell/phone within reach;Other (comment);with bed alarm set(seated EOB) Nurse Communication: Mobility status PT Visit Diagnosis: Unsteadiness on feet  (R26.81);History of falling (Z91.81);Difficulty in walking, not elsewhere classified (R26.2);Muscle weakness (generalized) (M62.81)    Time: 1451-1530 PT Time Calculation (min) (ACUTE ONLY): 39 min   Charges:       Lurena Joiner B. Barbara Keng, PT, DPT  Acute Rehabilitation [phone] pager #[phone] office   PT Evaluation $PT Eval Moderate Complexity: 1 Mod PT Treatments $Gait Training: 23-37 mins       07/07/2018, 3:52 PM

## 2018-07-08 ENCOUNTER — Ambulatory Visit: Payer: Self-pay

## 2018-07-08 DIAGNOSIS — N183 Chronic kidney disease, stage 3 (moderate): Secondary | ICD-10-CM

## 2018-07-08 LAB — CBC WITH DIFFERENTIAL/PLATELET
ABS IMMATURE GRANULOCYTES: 0.04 10*3/uL (ref 0.00–0.07)
BASOS PCT: 1 %
Basophils Absolute: 0.1 10*3/uL (ref 0.0–0.1)
Eosinophils Absolute: 0.7 10*3/uL — ABNORMAL HIGH (ref 0.0–0.5)
Eosinophils Relative: 9 %
HCT: 35 % — ABNORMAL LOW (ref 39.0–52.0)
Hemoglobin: 10.5 g/dL — ABNORMAL LOW (ref 13.0–17.0)
Immature Granulocytes: 1 %
LYMPHS ABS: 2.8 10*3/uL (ref 0.7–4.0)
Lymphocytes Relative: 38 %
MCH: 27.9 pg (ref 26.0–34.0)
MCHC: 30 g/dL (ref 30.0–36.0)
MCV: 92.8 fL (ref 80.0–100.0)
Monocytes Absolute: 0.6 10*3/uL (ref 0.1–1.0)
Monocytes Relative: 9 %
Neutro Abs: 3.2 10*3/uL (ref 1.7–7.7)
Neutrophils Relative %: 42 %
Platelets: 291 10*3/uL (ref 150–400)
RBC: 3.77 MIL/uL — ABNORMAL LOW (ref 4.22–5.81)
RDW: 13.3 % (ref 11.5–15.5)
WBC: 7.4 10*3/uL (ref 4.0–10.5)
nRBC: 0 % (ref 0.0–0.2)

## 2018-07-08 LAB — URINALYSIS, ROUTINE W REFLEX MICROSCOPIC
Bacteria, UA: NONE SEEN
Bilirubin Urine: NEGATIVE
Hgb urine dipstick: NEGATIVE
Ketones, ur: NEGATIVE mg/dL
Leukocytes,Ua: NEGATIVE
Nitrite: NEGATIVE
PROTEIN: NEGATIVE mg/dL
Specific Gravity, Urine: 1.005 (ref 1.005–1.030)
pH: 6 (ref 5.0–8.0)

## 2018-07-08 LAB — GLUCOSE, CAPILLARY
Glucose-Capillary: 282 mg/dL — ABNORMAL HIGH (ref 70–99)
Glucose-Capillary: 269 mg/dL — ABNORMAL HIGH (ref 70–99)
Glucose-Capillary: 325 mg/dL — ABNORMAL HIGH (ref 70–99)
Glucose-Capillary: 246 mg/dL — ABNORMAL HIGH (ref 70–99)
Glucose-Capillary: 235 mg/dL — ABNORMAL HIGH (ref 70–99)

## 2018-07-08 LAB — BASIC METABOLIC PANEL
Anion gap: 7 (ref 5–15)
BUN: 12 mg/dL (ref 8–23)
CO2: 23 mmol/L (ref 22–32)
Calcium: 9.3 mg/dL (ref 8.9–10.3)
Chloride: 105 mmol/L (ref 98–111)
Creatinine, Ser: 1.35 mg/dL — ABNORMAL HIGH (ref 0.61–1.24)
GFR calc Af Amer: >60 mL/min (ref >60)
GFR calc non Af Amer: 56 mL/min — ABNORMAL LOW (ref >60)
Glucose, Bld: 371 mg/dL — ABNORMAL HIGH (ref 70–99)
Potassium: 4.4 mmol/L (ref 3.5–5.1)
Sodium: 135 mmol/L (ref 135–145)

## 2018-07-08 LAB — PSA: Prostatic Specific Antigen: 0.41 ng/mL (ref 0.00–4.00)

## 2018-07-08 MED ORDER — INSULIN GLARGINE 100 UNIT/ML ~~LOC~~ SOLN
30.0000 [IU] | Freq: Every day | SUBCUTANEOUS | Status: DC
  Filled 2018-07-08: qty 0.3

## 2018-07-08 MED ORDER — TAMSULOSIN HCL 0.4 MG PO CAPS
0.8000 mg | ORAL_CAPSULE | Freq: Every day | ORAL | Status: DC
  Administered 2018-07-09 – 2018-07-10 (×2): 0.8 mg via ORAL
  Filled 2018-07-08 (×2): qty 2

## 2018-07-08 NOTE — Progress Notes (Signed)
At bladder scan there were 875 ml post void residual. MD notified. Will continue to monitor.

## 2018-07-08 NOTE — Progress Notes (Signed)
IV line was D/C per MD order. Will continue to monitor.

## 2018-07-08 NOTE — Clinical Social Work Note (Signed)
Clinical Social Work Assessment  Patient Details  Name: Dylan Jordan MRN: [id number] Date of Birth: Sep 10, 1955  Date of referral:  07/08/18               Reason for consult:  Facility Placement                Permission sought to share information with:  Facility Industrial/product designer granted to share information::  Yes, Verbal Permission Granted  Name::     Sherre Scarlet::  SNFs  Relationship::  Niece (Payee)  Contact Information:  [phone]  Housing/Transportation Living arrangements for the past 2 months:  Apartment Source of Information:  Patient Patient Interpreter Needed:  None Criminal Activity/Legal Involvement Pertinent to Current Situation/Hospitalization:  No - Comment as needed Significant Relationships:  Other Family Members Lives with:  Self Do you feel safe going back to the place where you live?  No Need for family participation in patient care:  Yes (Comment)  Care giving concerns:  [name] received consult for possible SNF placement at time of discharge. [name] spoke with patient regarding PT recommendation of SNF placement at time of discharge. Patient reported that he lives alone and is interested in rehab. Patient expressed understanding of PT recommendation and is agreeable to SNF placement at time of discharge. [name] to continue to follow and assist with discharge planning needs.   Social Worker assessment / plan:  [name] spoke with patient concerning possibility of rehab at [facility] before returning home.  Employment status:  Disabled (Comment on whether or not currently receiving Disability) Insurance information:  Medicaid In [place] PT Recommendations:  Skilled Nursing Facility Information / Referral to community resources:  Skilled Nursing Facility  Patient/Family's Response to care:  [name] explained Medicaid SNF process. Patient reports that he receives SSD and his niece is his payee. He provided [name] with permission to contact her to discuss discharge plan.  [name] explained that the SNF would likely keep his check as payment for patient to stay 30 days and that is up to DSS if they let him keep the check. He is unsure if he can afford to do so because he pays rent. [name] will contact his niece.   Patient/Family's Understanding of and Emotional Response to Diagnosis, Current Treatment, and Prognosis:  Patient/family is realistic regarding therapy needs and expressed being hopeful for SNF placement. Patient expressed understanding of [name] role and discharge process as well as medical condition. No questions/concerns about plan or treatment.    Emotional Assessment Appearance:  Appears stated age Attitude/Demeanor/Rapport:  Engaged Affect (typically observed):  Accepting, Appropriate Orientation:  Oriented to Self, Oriented to Place, Oriented to  Time, Oriented to Situation Alcohol / Substance use:  Not Applicable Psych involvement (Current and /or in the community):  No (Comment)  Discharge Needs  Concerns to be addressed:  Care Coordination Readmission within the last 30 days:  No Current discharge risk:  Dependent with Mobility Barriers to Discharge:  Continued Medical Work up   [employer], LCSW 07/08/2018, 3:03 PM

## 2018-07-08 NOTE — Evaluation (Signed)
Occupational Therapy Evaluation Patient Details Name: Dylan Jordan MRN: [id number] DOB: Oct 11, 1955 Today's Date: 07/08/2018    History of Present Illness 63 y.o. male admitted on 07/04/18 for hypomoslar hyperglycemic state (HHS) with blood glucose in the 700s and acute kidney injury.  Pt with other significant PMH of schizophrenia, DM, anemia, asthma, HTN, gout, L TKA, toe surgery.     Clinical Impression   Pt admitted with HHS and now presenting with deficits in dynamic balance and safety awareness related to deficits. Pt reports barrier to AD use/compliance is related to (real vs. Perceived) threats of safety in his neighborhood (I.e reports "people with walkers get robbed"). Pt edu extensively re current deficits and the need for AD to reduce fall risk. Pt was pleasant and motivated throughout session, saying he would "do what he needs to do to get better". D/t high fall risk, previous hx of falls, reduced caregiver support, and low vision in L eye, feel SNF is most appropriate d/c at this time. OT will continue to follow acutely.     Follow Up Recommendations  SNF    Equipment Recommendations  None recommended by OT(defer to next venue)    Recommendations for Other Services PT consult;Speech consult     Precautions / Restrictions Precautions Precautions: Fall Precaution Comments: pt reports recent h/o falling Restrictions Weight Bearing Restrictions: No      Mobility Bed Mobility Overal bed mobility: Modified Independent             General bed mobility comments: extra time and railing used.   Transfers Overall transfer level: Needs assistance Equipment used: None Transfers: Sit to/from Stand Sit to Stand: Min guard         General transfer comment: Min guard assist for safety during transition, reliance on lower legs stabilized on the bed to come to standing.     Balance Overall balance assessment: Needs assistance Sitting-balance support: Feet supported;No  upper extremity supported Sitting balance-Leahy Scale: Good     Standing balance support: No upper extremity supported Standing balance-Leahy Scale: Poor Standing balance comment: Needs external support                           ADL either performed or assessed with clinical judgement   ADL Overall ADL's : Needs assistance/impaired Eating/Feeding: Independent   Grooming: Oral care;Wash/dry face;Wash/dry hands;Sitting;Modified independent   Upper Body Bathing: Min guard;Standing   Lower Body Bathing: Min guard;Sit to/from stand   Upper Body Dressing : Sitting;Modified independent   Lower Body Dressing: Min guard;Sit to/from stand   Toilet Transfer: Min guard;Grab Agricultural consultant Details (indicate cue type and reason): heavy use of grab bars during low toilet transfer Toileting- Clothing Manipulation and Hygiene: Supervision/safety;Sit to/from stand       Functional mobility during ADLs: Min guard General ADL Comments: No AD used, min guard throughout with furniture walking tendencies. 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, slight ptosis) Wears Glasses: At all times Patient Visual Report: No change from baseline Vision Assessment?: Yes Eye Alignment: Within Functional Limits Ocular Range of Motion: Within Functional Limits(fatigue present during entire eye  exam) Alignment/Gaze Preference: Within Defined Limits Tracking/Visual Pursuits: Decreased smoothness of horizontal tracking;Decreased smoothness of vertical tracking Saccades: Overshoots Convergence: Within functional limits Visual Fields: Left visual field  deficit Additional Comments: baseline of L eye deficits            Pertinent Vitals/Pain Pain Assessment: No/denies pain     Hand Dominance Right   Extremity/Trunk Assessment Upper Extremity Assessment Upper Extremity Assessment: Overall WFL for tasks assessed   Lower Extremity Assessment Lower Extremity Assessment: Defer to PT evaluation   Cervical / Trunk Assessment Cervical / Trunk Assessment: Normal   Communication Communication Communication: No difficulties(Slurred speech, however baseline per chart)   Cognition Arousal/Alertness: Awake/alert Behavior During Therapy: WFL for tasks assessed/performed Overall Cognitive Status: No family/caregiver present to determine baseline cognitive functioning                                 General Comments: Pt has decreased health literacy and decreased awareness of his deficits and how that relates to the safest AD for him to use.                Home Living Family/patient expects to be discharged to:: Private residence Living Arrangements: Alone   Type of Home: Apartment Home Access: Stairs to enter [employer] of Steps: 1 Entrance Stairs-Rails: None Home Layout: One level     Bathroom Shower/Tub: Chief Strategy Officer: Standard     Home Equipment: Cane - single point          Prior Functioning/Environment Level of Independence: Independent with assistive device(s)  Gait / Transfers Assistance Needed: Occasionally used cane              OT Problem List: Impaired vision/perception;Impaired balance (sitting and/or standing);Decreased coordination;Decreased safety awareness;Decreased cognition;Decreased knowledge of use of DME or AE;Decreased knowledge of precautions      OT Treatment/Interventions: Self-care/ADL training;Therapeutic exercise;Energy conservation;DME and/or AE instruction;Therapeutic activities;Cognitive remediation/compensation;Visual/perceptual  remediation/compensation;Balance training;Patient/family education    OT Goals(Current goals can be found in the care plan section) Acute Rehab OT Goals Patient Stated Goal: "do what I need to do" OT Goal Formulation: With patient Time For Goal Achievement: 07/13/18 Potential to Achieve Goals: Good  OT Frequency: Min 2X/week   Barriers to D/C: Decreased caregiver support             AM-PAC OT "6 Clicks" Daily Activity     Outcome Measure Help from another person eating meals?: None Help from another person taking care of personal grooming?: None Help from another person toileting, which includes using toliet, bedpan, or urinal?: A Little Help from another person bathing (including washing, rinsing, drying)?: A Little Help from another person to put on and taking off regular upper body clothing?: None Help from another person to put on and taking off regular lower body clothing?: A Little 6 Click Score: 21   End of Session Equipment Utilized During Treatment: Gait belt  Activity Tolerance: Patient tolerated treatment well Patient left: in bed;with call bell/phone within reach;with bed alarm set  OT Visit Diagnosis: Unsteadiness on feet (R26.81);Repeated falls (R29.6);Muscle weakness (generalized) (M62.81);History of falling (Z91.81)                Time: 4098-1191 OT Time Calculation (min): 34 min Charges:  OT General Charges $OT Visit: 1 Visit OT Evaluation $OT Eval Low Complexity: 1 Low OT Treatments $Self Care/Home Management : 8-22 mins   Dois Davenport  H Mabell Esguerra OTR/L  07/08/2018, 10:53 AM

## 2018-07-08 NOTE — Progress Notes (Signed)
Family Medicine Teaching Service Daily Progress Note Intern Pager: [phone]  Patient name: Dylan Jordan Medical record number: [id number] Date of birth: [birth year] Age: 63 y.o. Gender: male  Primary Care Provider: Tillman Sers, DO Consultants:  Code Status: Full Code   Pt Overview and Major Events to Date:  Admitted: 07/04/2018 for CC: Hyperglycemia  Hospital Day: 5  Assessment and Plan: Matthews Garnier is a 63 y.o. male admitted for HHS. He  has a past medical history of Anemia, Asthma, Diabetes mellitus without complication, Gout, Hyperlipidemia, Hypertension, and Schizophrenia   # T2DM, poorly controlled  HHS, resolved: Patient's glucose between 132-282 in the last 24 hours. Fasting glucose 371 this AM 0400. Patient is noted to be taking extra PO by calling the kitchen and asking the floor for juices, etc. Patient 21 units novolog sliding scale.   Holding home victoza as not on formulary   Increase lantus to 30 units   Moderate SSI   CBGs TID WC and no QHS  #BPH Patient with history of both incontinence and retaining consistent with worsening BPH. Post void residual obtained this morning showed 975 cc. Will increase flomax today.  Plans to add on finasteride outpatient.  Patient uses Depends at home's, but we do not have in the hospital   Increase home flomax 0.8mg  daily   Bladder scan BID   Order Free PSA  Dc condom cath  # CKD  Creatinine is 1.35, appears to be baseline for patient. Likely in setting of DM  AM BMP to monitor Cr   #Schizophrenia Patient at baseline mental status   Continue home 500mg  AM & 1,500mg  PM Depakote  #Anemia  Stable  Continue 325 mg Ferrous sulfate.  #Asthma  PRN albuterol  #Gout  Continue home allopurinol  #HLD  Continue atorvastatin 80 (increased from 40 at admission)  #HTN Normotensive   Cont home losartan  #GERD  PTX   #FEN/GI:   Fluids: saline lock    Electrolytes: wnl  Nutrition: Heart  Healthy/Carb Modified     Access: d/c PIV VTE prophylaxis: Lovenox (prophylaxtic dose) Disposition: SNF per PT/OT, SW consulted    Subjective:  NAEO.   Objective: BP 117/86 (BP Location: Left Arm)   Pulse 68   Temp (!) 97.5 F (36.4 C) (Oral)   Resp 18   Ht 6\' 1"  (1.854 m)   Wt 99.5 kg   SpO2 98%   BMI 28.94 kg/m   Physical Exam: Physical Exam Gen: NAD, alert, non-toxic, well-appearing, sitting comfortably  Skin: Warm and dry.  HEENT: NCAT.  MMM.  CV: RRR.  Normal S1-S2. No BLEE. Resp: CTAB. No increased WOB Abd: NTND. Pos bowel sounds Extremities: Warm and well perfused.   Laboratory: I have personally read and reviewed all labs and imaging studies.  Trending Labs: Cr 1.35 from 1.28. Fasting glucose 371. Hgb 10.5  GFR: Estimated Creatinine Clearance: 70.4 mL/min (A) (by C-G formula based on SCr of 1.35 mg/dL (H)). CBG: Recent Labs  Lab 07/07/18 0814 07/07/18 1443 07/07/18 1708 07/07/18 2104 07/08/18 0743  GLUCAP 150* 254* 225* 132* 282*   Imaging/Diagnostic Tests: Nothing Glynis Smiles, MD 07/08/2018, 8:01 AM PGY-1, [facility] Family Medicine FPTS Intern pager: [phone], text pages welcome

## 2018-07-08 NOTE — Progress Notes (Signed)
[name] contacted patient's niece, [name]. She reported that she thinks that patient will not want the hassle of signing his SSD check to a SNF, but she will check with her mom. She requested that patient be given education about his gout and diabetes. [name] let her know that the diabetes coordinator has met with the patient. She also requested a walker for the patient (even though patient does not want to use it). [name] will update RNCM.   Percell Locus Laveah Gloster LCSW [phone]

## 2018-07-09 LAB — GLUCOSE, CAPILLARY
Glucose-Capillary: 287 mg/dL — ABNORMAL HIGH (ref 70–99)
Glucose-Capillary: 231 mg/dL — ABNORMAL HIGH (ref 70–99)
Glucose-Capillary: 210 mg/dL — ABNORMAL HIGH (ref 70–99)
Glucose-Capillary: 124 mg/dL — ABNORMAL HIGH (ref 70–99)

## 2018-07-09 MED ORDER — FINASTERIDE 5 MG PO TABS
5.0000 mg | ORAL_TABLET | Freq: Every day | ORAL | Status: DC
  Administered 2018-07-09 – 2018-07-10 (×2): 5 mg via ORAL
  Filled 2018-07-09 (×2): qty 1

## 2018-07-09 MED ORDER — HALOPERIDOL DECANOATE 100 MG/ML IM SOLN
400.0000 mg | Freq: Once | INTRAMUSCULAR | Status: AC
  Administered 2018-07-09: 400 mg via INTRAMUSCULAR
  Filled 2018-07-09: qty 4

## 2018-07-09 MED ORDER — INSULIN GLARGINE 100 UNIT/ML ~~LOC~~ SOLN
35.0000 [IU] | Freq: Every day | SUBCUTANEOUS | Status: DC
  Administered 2018-07-09 – 2018-07-10 (×2): 35 [IU] via SUBCUTANEOUS
  Filled 2018-07-09 (×2): qty 0.35

## 2018-07-09 NOTE — Progress Notes (Signed)
[name] spoke with patient's niece to confirm discharge plan. She reported that they do not want patient to go to a SNF and will assist him at home. She requests that patient have adequate information on medications for diabetes and gout. She requests home health nursing and PT if possible and a walker. She can pick patient up tomorrow night or he can take a cab. MD aware.   Osborne Casco Zakai Gonyea LCSW [phone]

## 2018-07-09 NOTE — Progress Notes (Signed)
Physical Therapy Treatment Patient Details Name: Dylan Jordan MRN: [id number] DOB: 1956-05-11 Today's Date: 07/09/2018    History of Present Illness 63 y.o. male admitted on 07/04/18 for hypomoslar hyperglycemic state (HHS) with blood glucose in the 700s and acute kidney injury.  Pt with other significant PMH of schizophrenia, DM, anemia, asthma, HTN, gout, L TKA, toe surgery.      PT Comments    Patient progressing well towards PT goals. Tolerated gait training with use of RW and Min A-min guard for balance/safety. Balance seems to be improving. Feels more secure using RW for cane. Pt reports having a big appetite and not getting enough food up here. Pt with poor health literacy and does not understand why he cannot eat all the food he wants despite diagnosis of DM.  Encouraged walking with nursing daily. Will continue to follow.   Follow Up Recommendations  SNF     Equipment Recommendations  Rolling walker with 5" wheels;3in1 (PT)    Recommendations for Other Services       Precautions / Restrictions Precautions Precautions: Fall Precaution Comments: pt reports recent h/o falling Restrictions Weight Bearing Restrictions: No    Mobility  Bed Mobility               General bed mobility comments: Sitting EOB upon PT arrival.   Transfers Overall transfer level: Needs assistance Equipment used: Rolling walker (2 wheeled) Transfers: Sit to/from Stand Sit to Stand: Min guard         General transfer comment: Min guard assist for safety during transition.   Ambulation/Gait Ambulation/Gait assistance: Min assist Gait Distance (Feet): 150 Feet Assistive device: Rolling walker (2 wheeled) Gait Pattern/deviations: Step-through pattern;Decreased stride length;Trunk flexed Gait velocity: 1.29 ft/sec Gait velocity interpretation: <1.8 ft/sec, indicate of risk for recurrent falls General Gait Details: Slow, shuffling like gait with flexed posture. A few standing rest  breaks due to fatigue. Difficulty with turns- very slow. Walked inr oom with no DME and unsteady.   Stairs             Wheelchair Mobility    Modified Rankin (Stroke Patients Only)       Balance Overall balance assessment: Needs assistance Sitting-balance support: Feet supported;No upper extremity supported Sitting balance-Leahy Scale: Good     Standing balance support: During functional activity Standing balance-Leahy Scale: Fair Standing balance comment: Able to stand statically without UE support but does better with UE support for ambulation.                            Cognition Arousal/Alertness: Awake/alert Behavior During Therapy: WFL for tasks assessed/performed Overall Cognitive Status: No family/caregiver present to determine baseline cognitive functioning                                 General Comments: Pt has decreased health literacy and decreased awareness of his deficits.      Exercises      General Comments        Pertinent Vitals/Pain Pain Assessment: Faces Faces Pain Scale: Hurts little more Pain Location: left knee Pain Descriptors / Indicators: Sore Pain Intervention(s): Monitored during session    Home Living                      Prior Function            PT Goals (current  goals can now be found in the care plan section) Progress towards PT goals: Progressing toward goals    Frequency    Min 3X/week      PT Plan Current plan remains appropriate    Co-evaluation              AM-PAC PT "6 Clicks" Mobility   Outcome Measure  Help needed turning from your back to your side while in a flat bed without using bedrails?: A Little Help needed moving from lying on your back to sitting on the side of a flat bed without using bedrails?: A Little Help needed moving to and from a bed to a chair (including a wheelchair)?: A Little Help needed standing up from a chair using your arms (e.g.,  wheelchair or bedside chair)?: A Little Help needed to walk in hospital room?: A Little Help needed climbing 3-5 steps with a railing? : A Little 6 Click Score: 18    End of Session Equipment Utilized During Treatment: Gait belt Activity Tolerance: Patient tolerated treatment well Patient left: in bed;with call bell/phone within reach Nurse Communication: Mobility status PT Visit Diagnosis: Unsteadiness on feet (R26.81);History of falling (Z91.81);Difficulty in walking, not elsewhere classified (R26.2);Muscle weakness (generalized) (M62.81)     Time: 6967-8938 PT Time Calculation (min) (ACUTE ONLY): 24 min  Charges:  $Gait Training: 23-37 mins                     Mylo Red, [place], DPT Acute Rehabilitation Services Pager [phone] Office [phone]       Blake Divine A Lanier Ensign 07/09/2018, 1:39 PM

## 2018-07-09 NOTE — Progress Notes (Signed)
Writer in the patient's room trying to educate him about the importance of his diet for diabetes control. He said that he doesn't want to talk about this right now or later today. I told to the patient that I will be happy to come back in his room and talk with him if he will change his mind. Papers left on the table in the patient's room. Will continue to monitor.

## 2018-07-09 NOTE — Progress Notes (Signed)
Family Medicine Teaching Service Daily Progress Note Intern Pager: [phone]  Patient name: Dylan Jordan Medical record number: [id number] Date of birth: [birth year] Age: 63 y.o. Gender: male  Primary Care Provider: Tillman Sers, DO Consultants:  Code Status: Full Code   Pt Overview and Major Events to Date:  Admitted: 07/04/2018 for CC: Hyperglycemia  Hospital Day: 6  Assessment and Plan: Elman Mccomiskey is a 63 y.o. male admitted for HHS. He  has a past medical history of Anemia, Asthma, Diabetes mellitus without complication, Gout, Hyperlipidemia, Hypertension, and Schizophrenia   #T2DM, poorly controlled, HHS, resolved FBG this am 235. Has received 27 units of Novolog in the past 24 hours. Patient has had several dietary indiscretion while inpatient and suspect he will continue to do so once discharge. Will need to simplify regimen prior to discharge with close follow with PCP (Dr. Wonda Olds) and Dr. Raymondo Band for further titration. Patient is noted to be taking extra PO by calling the kitchen and asking the floor for juices.   Holding home Victoza will resume on discharge  Avoid sliding scale on discharge   Increase Lantus to 35 units   Moderate SSI   CBGs TID WC and no QHS  #BPH Patient with history of both incontinence and retaining consistent with worsening BPH. Post void residual obtained this morning showed 501 cc. Will increase flomax today. PSA was 0.41. Will add on finasteride prior to discharge.    Increase home flomax 0.8 mg daily   Bladder scan BID   # CKD  Creatinine is 1.35, appears to be baseline for patient. Likely in setting of DM   #Schizophrenia Patient at baseline mental status   Continue home 500mg  AM & 1,500mg  PM Depakote  #Anemia  Stable  Continue 325 mg Ferrous sulfate.  #Asthma  PRN albuterol  #Gout  Continue home allopurinol  #HLD  Continue atorvastatin 80 (increased from 40 at admission)  #HTN Normotensive   Cont home  losartan  #GERD  PTX   #FEN/GI:   Fluids: saline lock    Electrolytes: wnl  Nutrition: Heart Healthy/Carb Modified      VTE prophylaxis: Lovenox (prophylaxtic dose) Disposition: SNF per PT/OT, SW consulted    Subjective:  Patient feeling well this morning. Requested 2 orange juices. Patient would like to shower and have family bring him fresh clothing. He would like his niece and sister to handle his placement to SNF. Aware he needs to be more careful with his diet going forward.   Objective: BP 125/88 (BP Location: Left Arm)   Pulse 84   Temp 98.2 F (36.8 C) (Oral)   Resp 18   Ht 6\' 1"  (1.854 m)   Wt 99.5 kg   SpO2 98%   BMI 28.94 kg/m   Physical Exam: Gen: NAD, alert, non-toxic, well-appearing, sitting comfortably  Skin: Warm and dry.  HEENT: NCAT.  MMM.  CV: RRR.  Normal S1-S2. No BLEE. Resp: CTAB. No increased WOB Abd: NTND. Pos bowel sounds Extremities: Warm and well perfused.   Laboratory: I have personally read and reviewed all labs and imaging studies.  Trending Labs: Cr 1.35 from 1.28. Fasting glucose 371. Hgb 10.5  GFR: Estimated Creatinine Clearance: 70.4 mL/min (A) (by C-G formula based on SCr of 1.35 mg/dL (H)). CBG: Recent Labs  Lab 07/08/18 0743 07/08/18 1135 07/08/18 1713 07/08/18 1943 07/08/18 2212  GLUCAP 282* 269* 325* 246* 235*   Imaging/Diagnostic Tests: Nothing Lesia Hausen, MD 07/09/2018, 7:38 AM PGY-3, [facility] Family Medicine  [place] Intern pager: [phone], text pages welcome

## 2018-07-09 NOTE — Progress Notes (Signed)
Patient able to ambulate in the hallway using a walker (aproximately 400 feet). Patient  unsteady on his feet. Will continue to monitor.

## 2018-07-09 NOTE — Progress Notes (Signed)
All shift patient was asking for more food, more juice, ice-cream, pudding. He is calling the kitchen asking for more food. Staff educated patient about his diet for diabetes control. MD notified.

## 2018-07-09 NOTE — Progress Notes (Signed)
At bladder scan there were 501 ml urine retaining. MD notified. Will continue to monitor.

## 2018-07-09 NOTE — Progress Notes (Signed)
At bladder scan - 411 cc urine. MD notified. Patient was willing to administer himself Insulin. He was assisted bu staff. Will continue to monitor.

## 2018-07-10 LAB — GLUCOSE, CAPILLARY
GLUCOSE-CAPILLARY: 124 mg/dL — AB (ref 70–99)
Glucose-Capillary: 229 mg/dL — ABNORMAL HIGH (ref 70–99)

## 2018-07-10 MED ORDER — FINASTERIDE 5 MG PO TABS: 5.0000 mg | ORAL_TABLET | Freq: Every day | ORAL | 0 refills | Status: DC

## 2018-07-10 MED ORDER — INSULIN GLARGINE 100 UNIT/ML ~~LOC~~ SOLN: 35.0000 [IU] | Freq: Every day | SUBCUTANEOUS | 11 refills | Status: DC

## 2018-07-10 MED ORDER — TAMSULOSIN HCL 0.4 MG PO CAPS: 0.8000 mg | ORAL_CAPSULE | Freq: Every day | ORAL | 0 refills | Status: DC

## 2018-07-10 MED ORDER — LIVING WELL WITH DIABETES BOOK
Freq: Once | Status: AC
  Administered 2018-07-10: 09:00:00
  Filled 2018-07-10: qty 1

## 2018-07-10 NOTE — Progress Notes (Signed)
Nsg Discharge Note  Admit Date:  07/04/2018 Discharge date: 07/10/2018   Dylan Sobotka to be D/C'd Home per MD order.  AVS completed.  Copy for chart, and copy for patient signed, and dated. Patient/caregiver able to verbalize understanding.  Discharge Medication: Allergies as of 07/10/2018      Reactions   Chantix [varenicline Tartrate] Other (See Comments)   Nightmares   Ativan [lorazepam] Other (See Comments)   Agitation (and "5 others like it") Will need to call [facility] [place] (phone) [phone] and [phone] to fax a release-   Shellfish-derived Products Itching   Penicillins Diarrhea   Has patient had a PCN reaction causing immediate rash, facial/tongue/throat swelling, SOB or lightheadedness with hypotension: No Has patient had a PCN reaction causing severe rash involving mucus membranes or skin necrosis: No Has patient had a PCN reaction that required hospitalization: No Has patient had a PCN reaction occurring within the last 10 years: No If all of the above answers are "NO", then may proceed with Cephalosporin use.      Medication List    STOP taking these medications   Insulin Glargine 100 UNIT/ML Solostar Pen Commonly known as:  LANTUS SOLOSTAR Replaced by:  insulin glargine 100 UNIT/ML injection     TAKE these medications   ACCU-CHEK AVIVA PLUS w/Device Kit Use to check blood sugar three times daily or as directed.   ACCU-CHEK SOFTCLIX LANCETS lancets use as directed *may use THREE TIMES DAILY to check blood sugar*   acetaminophen 500 MG tablet Commonly known as:  TYLENOL Take 1,000 mg by mouth every 6 (six) hours as needed (for pain).   albuterol 108 (90 Base) MCG/ACT inhaler Commonly known as:  PROVENTIL HFA;VENTOLIN HFA Inhale 1-2 puffs into the lungs every 6 (six) hours as needed for wheezing or shortness of breath.   allopurinol 100 MG tablet Commonly known as:  ZYLOPRIM Take 2 tablets (200 mg total) by mouth daily.    atorvastatin 20 MG tablet Commonly known as:  LIPITOR Take 1 tablet (20 mg total) by mouth daily.   benztropine 2 MG tablet Commonly known as:  COGENTIN Take 2 mg by mouth 2 (two) times daily.   DAILY-VITE Tabs TAKE 1 TABLET BY MOUTH DAILY.   divalproex 500 MG DR tablet Commonly known as:  DEPAKOTE Take 500-1,500 mg by mouth See admin instructions. Take 500 mg by mouth in the morning and 1,500 mg in the evening   ferrous sulfate 325 (65 FE) MG tablet Commonly known as:  FERROUSUL Take 1 tablet (325 mg total) by mouth daily with breakfast.   finasteride 5 MG tablet Commonly known as:  PROSCAR Take 1 tablet (5 mg total) by mouth daily. Start taking on:  July 11, 2018   fluticasone 110 MCG/ACT inhaler Commonly known as:  FLOVENT HFA Inhale 2 puffs into the lungs 2 (two) times daily.   gabapentin 300 MG capsule Commonly known as:  NEURONTIN Take 1 capsule (300 mg total) by mouth 3 (three) times daily as needed. What changed:    how much to take  when to take this  additional instructions   glimepiride 4 MG tablet Commonly known as:  AMARYL Take 4 mg by mouth daily.   glucose blood test strip Commonly known as:  ACCU-CHEK GUIDE Use to check blood sugar three times daily or as directed.   haloperidol 5 MG tablet Commonly known as:  HALDOL Take 5 mg by mouth at bedtime.   haloperidol decanoate 100 MG/ML  injection Commonly known as:  HALDOL DECANOATE Inject 200 mg into the muscle every 28 (twenty-eight) days.   insulin glargine 100 UNIT/ML injection Commonly known as:  LANTUS Inject 0.35 mLs (35 Units total) into the skin daily. Start taking on:  July 11, 2018 Replaces:  Insulin Glargine 100 UNIT/ML Solostar Pen   lidocaine 5 % Commonly known as:  LIDODERM Place 1 patch onto the skin daily for 30 doses. Remove & Discard patch within 12 hours or as directed by MD   liraglutide 18 MG/3ML Sopn Commonly known as:  VICTOZA Inject 0.3 mLs (1.8 mg total)  into the skin daily. What changed:  when to take this   lithium carbonate 300 MG capsule Take 300 mg by mouth 2 (two) times daily with a meal.   losartan 25 MG tablet Commonly known as:  COZAAR Take 1 tablet (25 mg total) by mouth daily.   omeprazole 20 MG capsule Commonly known as:  PRILOSEC Take 2 capsules (40 mg total) by mouth every evening.   Pen Needles 32G X 5 MM Misc 1 pen by Does not apply route daily.   NOVOFINE PLUS 32G X 4 MM Misc Generic drug:  Insulin Pen Needle 1 Container by Does not apply route daily.   tamsulosin 0.4 MG Caps capsule Commonly known as:  FLOMAX Take 2 capsules (0.8 mg total) by mouth daily. Start taking on:  July 11, 2018 What changed:    how much to take  when to take this            Durable Medical Equipment  (From admission, onward)         Start     Ordered   07/10/18 1101  For home use only DME Walker rolling  Once    Question:  Patient needs a walker to treat with the following condition  Answer:  Abnormal gait   07/10/18 1100          Discharge Assessment: Vitals:   07/09/18 2154 07/10/18 0459  BP: 119/87 (!) 129/92  Pulse: 68 73  Resp:  18  Temp: 98.2 F (36.8 C) 98.8 F (37.1 C)  SpO2: 99% 100%   Skin clean, dry and intact without evidence of skin break down, no evidence of skin tears noted. IV catheter discontinued intact. Site without signs and symptoms of complications - no redness or edema noted at insertion site, patient denies c/o pain - only slight tenderness at site.  Dressing with slight pressure applied.  D/c Instructions-Education: Discharge instructions given to patient/family with verbalized understanding. D/c education completed with patient/family including follow up instructions, medication list, d/c activities limitations if indicated, with other d/c instructions as indicated by MD - patient able to verbalize understanding, all questions fully answered. Patient instructed to return to ED,  call 911, or call MD for any changes in condition.  Patient escorted via [facility], and D/C home via [place] [employer] [place] [address] [place], [place] 07/10/2018 6:38 PM

## 2018-07-10 NOTE — Progress Notes (Signed)
Patient Successfully gave himself his sliding scale insulin. Patient was able to explain the difference between short and long acting insulin. Education handout given on Diabetes management and Diet. Handouts given on gout medication and diets as well.

## 2018-07-10 NOTE — Progress Notes (Signed)
Patient gave himself his lantus and sliding scale insulin. Patient educated on difference in the insulins and what they are given for. Living well with diabetes book given to patient to read. Patient state that he "didn't have time to read right now" due to having to many books at home to read. Patient educated on importance of reading about his condition and how to manage it.

## 2018-07-10 NOTE — Progress Notes (Signed)
[name] received consult regarding transportation home. [name] provided taxi voucher on shadow chart.   Osborne Casco Rontavious Albright LCSW [phone]

## 2018-07-10 NOTE — Progress Notes (Signed)
Patients Wallet, cards, money, and cell phone were returned to patient from security.

## 2018-07-10 NOTE — Care Management Note (Signed)
Case Management Note  Patient Details  Name: Dylan Jordan MRN: [id number] Date of Birth: 1956/01/13  Subjective/Objective:    Hypomoslar hyperglycemic/AKI. From home alone. Niece Velna Hatchet) states lives 10 mins away and can assist pt once d/c.  [name] (Niece) Jeannette Corpus (Sister)    [phone]     PCP: [name]  Action/Plan: Transition to home when medically ready. Home health services to follow/ [facility]. PT DECLINED SNF placement.  [name] to provide taxi voucher for transportation to home. Pt states has keys to get into home.  Expected Discharge Date:   07/10/2018            Expected Discharge Plan:  Home w Home Health Services  In-House Referral:  Clinical Social Work(declined SNF placement), taxi voucher  Discharge planning Services  CM Consult  Post Acute Care Choice:  NA Choice offered to:  Patient  DME Arranged:  Walker rolling DME Agency:  [facility]  HH Arranged:  RN, PT, OT, Nurse's Aide HH Agency:  [facility]  Status of Service:  Completed, signed off  If discussed at Long Length of Stay Meetings, dates discussed:    Additional Comments:  Epifanio Lesches, RN 07/10/2018, 12:56 PM

## 2018-07-10 NOTE — Progress Notes (Addendum)
Tx performed and charted by SPTA under direct guidance and supervision of PTA at all times. This session was performed under the supervision of a licensed clinician.   Charting reviewed for accuracy and depicts tx performed and services provided.   Based on progression and refusal of SNF placement will update recommendations to HHPT at this time.  Informed supervising PT.    Joycelyn Rua, PTA Acute Rehabilitation Services Pager [phone] Office [phone]  ---------------------------------------------------------------------------------------------------------------------------------------------------------------  Physical Therapy Treatment Patient Details Name: Dylan Jordan MRN: [id number] DOB: 05-Dec-1955 Today's Date: 07/10/2018    History of Present Illness 63 y.o. male admitted on 07/04/18 for hypomoslar hyperglycemic state (HHS) with blood glucose in the 700s and acute kidney injury.  Pt with other significant PMH of schizophrenia, DM, anemia, asthma, HTN, gout, L TKA, toe surgery.      PT Comments    Pt presented in chair and agreeable to "do any therapy." Pt tolerated therex and gait training well, with vc needed to continue when getting distracted d/t cognitive impairments. Pt in good spirits and displayed appropriate humor when passing by staff at desk area. Pt able to increase and decrease gait speed on command with no hesitation or LOB. Pt stated he does not need to be able to negotiate stair when returning home. Pt continues to progress with mobility. Pt agreeable to RW use. Cognitive impairments are still of concern. SNF discharge plan remains appropriate based on impulsive movements, communication difficulties, and cognitive factors. Pt refusing SNF placement and will need order for HHPT. Supervising PT has been notified and discharge plan will be changed to HHPT. Plan to progress with therex intensity and volume, and continued gait training.   Follow Up Recommendations   HHPT     Equipment Recommendations  Rolling walker with 5" wheels;3in1 (PT)    Recommendations for Other Services OT consult     Precautions / Restrictions Precautions Precautions: Fall Precaution Comments: pt reports recent h/o falling Restrictions Weight Bearing Restrictions: No    Mobility  Bed Mobility                  Transfers Overall transfer level: Modified independent Equipment used: Rolling walker (2 wheeled) Transfers: Sit to/from Stand Sit to Stand: Min guard         General transfer comment: Min guard assist for safety during transition.   Ambulation/Gait Ambulation/Gait assistance: Min guard Gait Distance (Feet): 225 Feet Assistive device: Rolling walker (2 wheeled) Gait Pattern/deviations: Step-through pattern;Decreased stride length;Trunk flexed Gait velocity: increased   General Gait Details: Slow, shuffling like gait with flexed posture. A few standing rest breaks due to fatigue. Difficulty with turns- very slow.    Stairs             Wheelchair Mobility    Modified Rankin (Stroke Patients Only)       Balance Overall balance assessment: Modified Independent Sitting-balance support: Feet supported;No upper extremity supported Sitting balance-Leahy Scale: Good     Standing balance support: During functional activity Standing balance-Leahy Scale: Fair Standing balance comment: Able to stand statically without UE support but does better with UE support for ambulation.                            Cognition Arousal/Alertness: Awake/alert Behavior During Therapy: WFL for tasks assessed/performed Overall Cognitive Status: No family/caregiver present to determine baseline cognitive functioning  General Comments: Pt has decreased health literacy and decreased awareness of his deficits.      Exercises General Exercises - Lower Extremity Ankle Circles/Pumps: Seated;10  reps;AROM;Both Long Arc Quad: Both;AROM;10 reps;Seated Hip ABduction/ADduction: AROM;10 reps;Seated;Both Hip Flexion/Marching: 10 reps;AROM;Seated;Both Other Exercises Other Exercises: Full sit to stands from recliner 2 x 5    General Comments        Pertinent Vitals/Pain Pain Assessment: No/denies pain    Home Living                      Prior Function            PT Goals (current goals can now be found in the care plan section) Acute Rehab PT Goals Patient Stated Goal: to go home PT Goal Formulation: With patient Potential to Achieve Goals: Good    Frequency    Min 3X/week      PT Plan Discharge plan needs to be updated    Co-evaluation              AM-PAC PT "6 Clicks" Mobility   Outcome Measure  Help needed turning from your back to your side while in a flat bed without using bedrails?: A Little Help needed moving from lying on your back to sitting on the side of a flat bed without using bedrails?: A Little Help needed moving to and from a bed to a chair (including a wheelchair)?: A Little Help needed standing up from a chair using your arms (e.g., wheelchair or bedside chair)?: None Help needed to walk in hospital room?: A Little Help needed climbing 3-5 steps with a railing? : A Little 6 Click Score: 19    End of Session Equipment Utilized During Treatment: Gait belt Activity Tolerance: Patient tolerated treatment well Patient left: in chair;with call bell/phone within reach Nurse Communication: Mobility status(contacted nurse to review meds and discharge plans per pt request) PT Visit Diagnosis: Unsteadiness on feet (R26.81);History of falling (Z91.81);Difficulty in walking, not elsewhere classified (R26.2);Muscle weakness (generalized) (M62.81)     Time: 6389-3734 PT Time Calculation (min) (ACUTE ONLY): 15 min  Charges:  $Gait Training: 8-22 mins                     Margarita Mail, SPTA   Margarita Mail 07/10/2018, 2:46 PM

## 2018-07-10 NOTE — Progress Notes (Signed)
Patient and Niece Ms. Laural Benes, were both educated on patients new medications. Several handouts were given to patient in regards to Diabetes and Gout. Patient educated extensively on diabetic diet. Patient given Victoza calender from Dr. Selena Batten to follow at home. patient was able to teach back insulin calender. Living well with diabetes book read over and explained to patient. All questions answered.

## 2018-07-11 ENCOUNTER — Telehealth: Payer: Self-pay | Admitting: Pharmacist

## 2018-07-11 ENCOUNTER — Telehealth: Payer: Self-pay | Admitting: *Deleted

## 2018-07-11 NOTE — Telephone Encounter (Signed)
Pt calls and is very confused on how much and when to take Lantus and Victoza.  He states that his instructions are different than what the discharge summary was saying.  Will forward to Dr. Raymondo Band as pts request. Andy Allende, Maryjo [place], CMA

## 2018-07-11 NOTE — Telephone Encounter (Signed)
Called patient and left message with instructions for Lantus (35 units) dosing and Victoza (0.8mg , with plans to titrate up in the next two weeks) Left clinic phone number for call back.

## 2018-07-11 NOTE — Telephone Encounter (Signed)
Pt is calling again because he has not taken any meds today. Fleeger, Maryjo [place], CMA

## 2018-07-11 NOTE — Telephone Encounter (Signed)
No answer. Left message for Dylan Jordan to call back regarding BG readings and current dose of insulin.  Wendelyn Breslow, PharmD PGY1 Pharmacy Resident Phone: [phone] 07/11/2018 1:05 PM

## 2018-07-12 ENCOUNTER — Emergency Department (HOSPITAL_COMMUNITY)
Admission: EM | Admit: 2018-07-12 | Discharge: 2018-07-13 | Disposition: A | Discharge status: Home / Self Care | Payer: Medicaid Other | Attending: Emergency Medicine | Admitting: Emergency Medicine

## 2018-07-12 ENCOUNTER — Other Ambulatory Visit: Payer: Self-pay

## 2018-07-12 ENCOUNTER — Encounter (HOSPITAL_COMMUNITY): Payer: Self-pay | Admitting: Emergency Medicine

## 2018-07-12 ENCOUNTER — Telehealth: Payer: Self-pay | Admitting: Family Medicine

## 2018-07-12 DIAGNOSIS — J4530 Mild persistent asthma, uncomplicated: Secondary | ICD-10-CM | POA: Insufficient documentation

## 2018-07-12 DIAGNOSIS — E1122 Type 2 diabetes mellitus with diabetic chronic kidney disease: Secondary | ICD-10-CM | POA: Insufficient documentation

## 2018-07-12 DIAGNOSIS — N289 Disorder of kidney and ureter, unspecified: Secondary | ICD-10-CM

## 2018-07-12 DIAGNOSIS — E1140 Type 2 diabetes mellitus with diabetic neuropathy, unspecified: Secondary | ICD-10-CM | POA: Diagnosis not present

## 2018-07-12 DIAGNOSIS — Z880 Allergy status to penicillin: Secondary | ICD-10-CM | POA: Insufficient documentation

## 2018-07-12 DIAGNOSIS — Z794 Long term (current) use of insulin: Secondary | ICD-10-CM | POA: Diagnosis not present

## 2018-07-12 DIAGNOSIS — F17210 Nicotine dependence, cigarettes, uncomplicated: Secondary | ICD-10-CM | POA: Insufficient documentation

## 2018-07-12 DIAGNOSIS — Z79899 Other long term (current) drug therapy: Secondary | ICD-10-CM | POA: Diagnosis not present

## 2018-07-12 DIAGNOSIS — I129 Hypertensive chronic kidney disease with stage 1 through stage 4 chronic kidney disease, or unspecified chronic kidney disease: Secondary | ICD-10-CM | POA: Diagnosis not present

## 2018-07-12 DIAGNOSIS — N3000 Acute cystitis without hematuria: Secondary | ICD-10-CM | POA: Diagnosis not present

## 2018-07-12 DIAGNOSIS — N189 Chronic kidney disease, unspecified: Secondary | ICD-10-CM | POA: Insufficient documentation

## 2018-07-12 DIAGNOSIS — R5383 Other fatigue: Secondary | ICD-10-CM | POA: Diagnosis present

## 2018-07-12 LAB — BASIC METABOLIC PANEL
ANION GAP: 7 (ref 5–15)
BUN: 21 mg/dL (ref 8–23)
CO2: 22 mmol/L (ref 22–32)
Calcium: 9.3 mg/dL (ref 8.9–10.3)
Chloride: 106 mmol/L (ref 98–111)
Creatinine, Ser: 1.82 mg/dL — ABNORMAL HIGH (ref 0.61–1.24)
GFR calc Af Amer: 45 mL/min — ABNORMAL LOW (ref >60)
GFR, EST NON AFRICAN AMERICAN: 39 mL/min — AB (ref >60)
Glucose, Bld: 192 mg/dL — ABNORMAL HIGH (ref 70–99)
Potassium: 5 mmol/L (ref 3.5–5.1)
Sodium: 135 mmol/L (ref 135–145)

## 2018-07-12 LAB — CBC
HCT: 34.6 % — ABNORMAL LOW (ref 39.0–52.0)
Hemoglobin: 10.3 g/dL — ABNORMAL LOW (ref 13.0–17.0)
MCH: 28.1 pg (ref 26.0–34.0)
MCHC: 29.8 g/dL — ABNORMAL LOW (ref 30.0–36.0)
MCV: 94.3 fL (ref 80.0–100.0)
Platelets: 272 10*3/uL (ref 150–400)
RBC: 3.67 MIL/uL — ABNORMAL LOW (ref 4.22–5.81)
RDW: 14.1 % (ref 11.5–15.5)
WBC: 11.7 10*3/uL — ABNORMAL HIGH (ref 4.0–10.5)
nRBC: 0 % (ref 0.0–0.2)

## 2018-07-12 LAB — CBG MONITORING, ED: GLUCOSE-CAPILLARY: 175 mg/dL — AB (ref 70–99)

## 2018-07-12 MED ORDER — SODIUM CHLORIDE 0.9% FLUSH: 3.0000 mL | Freq: Once | INTRAVENOUS | Status: DC

## 2018-07-12 NOTE — ED Triage Notes (Signed)
Pt reports recently being discharged from hospital for hyperglycemia. Pt reports he had a home visit, BP- 90 systolic and HR 104. Pt reports he feels drowsy. Pt denies abdominal pain, chest pain, N/V, SHOB, or sick contact.

## 2018-07-12 NOTE — Telephone Encounter (Signed)
**  After Hours/ Emergency Line Call**  Received a call to report that Dylan Jordan having hypotension and tachycardia.  Called Tommy Rainwater, patient's home health RN. BP 90/50. Patient feeling tired and heavy. BS 159. HR tachycardic to 104. Patient is on losartan. Speech is slurred, but patient states that this is constant. Patient is having trouble managing his diabetes regimen. Patient has been trying to increase fluid intake. Denies CP or SOB. But "feels heavy". RN noticed wobbly gait as well. Recommended that patient be evaluated in ED given objective vital sign changes. Wobbly gait is also concerning. Per RN patient may be in [place] with daughter because daughter was concerned of his health. I recommended that he be seen at nearest ED and does not necessarily need to come to [facility].  Red flags discussed.  Will forward to PCP.  Oralia Manis, DO PGY-2, [facility] 07/12/2018 8:10 PM

## 2018-07-12 NOTE — Telephone Encounter (Signed)
No answer - directly to voice mail.  Left message RE follow-up and provided direct call back number.

## 2018-07-13 ENCOUNTER — Other Ambulatory Visit: Payer: Self-pay | Admitting: Family Medicine

## 2018-07-13 ENCOUNTER — Emergency Department (HOSPITAL_COMMUNITY): Payer: Medicaid Other

## 2018-07-13 LAB — URINALYSIS, ROUTINE W REFLEX MICROSCOPIC
Bilirubin Urine: NEGATIVE
Glucose, UA: NEGATIVE mg/dL
Hgb urine dipstick: NEGATIVE
Ketones, ur: NEGATIVE mg/dL
Nitrite: NEGATIVE
Protein, ur: 30 mg/dL — AB
Specific Gravity, Urine: 1.017 (ref 1.005–1.030)
pH: 5 (ref 5.0–8.0)

## 2018-07-13 MED ORDER — CULTURELLE PROBIOTICS PO CHEW: 1.0000 | CHEWABLE_TABLET | Freq: Four times a day (QID) | ORAL | 0 refills | Status: DC

## 2018-07-13 MED ORDER — FINASTERIDE 5 MG PO TABS: 5.0000 mg | ORAL_TABLET | Freq: Every day | ORAL | 0 refills | Status: DC

## 2018-07-13 MED ORDER — CEFTRIAXONE SODIUM 1 G IJ SOLR
1.0000 g | Freq: Once | INTRAMUSCULAR | Status: AC
  Administered 2018-07-13: 1 g via INTRAMUSCULAR
  Filled 2018-07-13: qty 10

## 2018-07-13 MED ORDER — SODIUM CHLORIDE 0.9 % IV BOLUS: 1000.0000 mL | Freq: Once | INTRAVENOUS | Status: DC

## 2018-07-13 MED ORDER — LIDOCAINE HCL (PF) 1 % IJ SOLN
5.0000 mL | Freq: Once | INTRAMUSCULAR | Status: DC
  Filled 2018-07-13: qty 5

## 2018-07-13 MED ORDER — CEPHALEXIN 500 MG PO CAPS: 500.0000 mg | ORAL_CAPSULE | Freq: Four times a day (QID) | ORAL | 0 refills | Status: DC

## 2018-07-13 MED ORDER — SODIUM CHLORIDE 0.9 % IV SOLN
1.0000 g | Freq: Once | INTRAVENOUS | Status: DC
  Filled 2018-07-13: qty 10

## 2018-07-13 NOTE — ED Notes (Signed)
Admitting doctors here

## 2018-07-13 NOTE — ED Notes (Signed)
Pt returned from ct

## 2018-07-13 NOTE — ED Notes (Signed)
Earlier the pt had a rapid heart rate and a lower bp  It appears to have resolved at prfesent

## 2018-07-13 NOTE — Progress Notes (Addendum)
FPTS Interim Progress Note  S: Called by Ed physician Dr. Randal Buba to come lay eyes on patient to see if he was at baseline. Did not think he necessarily met criteria for admission but may benefit from admission and transfer to SNF 2/2 non compliance at  Home. Spoke with patient and patient's niece Naaman Plummer in the emergency department about the finding of low blood pressure at home today with blood pressure 90/50 and tachycardic.  The patient denies any symptoms of lightheadedness, chest pain, or shortness of breath at the time when his blood pressure was low.  Blood pressure in the emergency department is normotensive.  The patient also presents with a complaint of strong-smelling urine without dysuria and reports urinary frequency, especially at night, which that he reports is not new.  Otherwise, the patient reports feeling better now than he did while he was in the hospital.  He claims he is mostly doing what he was told in checking his blood sugars and taking his medicine, however he admits to not checking his blood sugar this morning.  He also reports he is staying hydrated, which is not reflected in the AKI with creatinine 1.82 in the ED.  Although the patient can most likely benefit from admission with goal of moving to SNF, the patient does not want to be admitted to the hospital or risk losing his section 8 housing.  It was expressed to the patient that not taking his medications and not controlling his diabetes can be very dangerous and lead to coma and even death.  The patient voiced understanding of this risk and remains adamant that he would like to go home and not be admitted to the hospital.  The niece (a nurse) has planned for the patient to stay with her over the weekend for "boot camp" to demonstrate that he can appropriately and safely take his medications, and agrees with patient's wish to decline admission.   Discussed in detail the dangers of medication non compliance at home. Patient was  able to verbalize the importance of taking medications and risks of not taking medications at home. He is addiment that he does not want admission and states he has no intention of ever going to SNF. Patient's niece at bedside (she is Therapist, sports). Stated he will have 48hr supervision at her home where she will re-educate patient on diabetes regimen. Patient states he would rather leave with f/u in clinic on Monday.   Patient with possible UTI given odor and frequency. UA with small leukocytes. Not otherwise septic so can be treated as outpatient. Cr at 1.82, BL 1.2-1.3.   O: BP 127/84   Pulse 81   Temp 98.4 F (36.9 C) (Oral)   Resp 16   SpO2 99%   Gen: NAD, patient at baseline mentation Cardio: RRR, S1-S2 present, no murmurs, rubs, gallops Lungs: CTA bilaterally, speaking full sentences Abdomen: Soft, nontender to palpation, bowel sounds auscultated in 4 quadrants Extremities: No injury or deformity, no edema or evidence of DVT, 2+ dorsalis pedis pulses bilaterally with 5/5 strength to dorsi and plantar flexion Neuro: Awake and oriented, at mental baseline, no focal deficits, strength intact  A/P: Discussed at length of recommendation for admission for transfer to SNF. Patient states he does not want to be admitted and has no intention of SNF. Niece at bedside agrees with dc with close follow up in clinic. Patient able to state risks and benefits of decision. Patient with [facility] RN who does go to house. 48hr supervision  by niece over the weekend. VSS stable in ED.  -Patient to stay with niece (a nurse) over weekend and demonstrate skills with medicines and regimen for diabetes. -Follow up in clinic on Monday, March 2nd at 9:10AM with Access to [place] for urinary odor and leukocytes seen on UTI. -Encourage good water intake and hydration for AKI -Casimer Lanius has been messaged to talk about food insecurities. Per ED physician, felt as if patient may have some insecurities that he does not  want to reveal to MD -Dr. Valentina Lucks has been messaged to inform of appointment on 3/2 to see if pharmacy staff can assist with ensuring patient compliance and perform insulin/Victoza administration re-training. -[facility] (Niece mentioned patient was out of medication) -Strict return precautions given, red flags discussed -DC with close follow up in clinic since patient and niece did not want admission, note made in appointment notes to recheck Cr -Dr. Gwendlyn Deutscher (attending on call) made aware of patient prior to dc from ED, agrees with follow up in clinic given that patient is medically stable and refusing admission    Daisy Floro, DO 07/13/2018, 4:24 AM PGY-1, [place] pager [phone]  FPTS Upper-Level Resident Addendum   I have independently interviewed and examined the patient. I have discussed the above with the original author and agree with their documentation. My edits for correction/addition/clarification are in blue. Please see also any attending notes.   Caroline More, DO PGY-2, [facility] 07/13/2018 5:02 AM  FPTS Service pager: [phone] (text pages welcome through [facility])

## 2018-07-13 NOTE — ED Provider Notes (Signed)
[facility] EMERGENCY DEPARTMENT Provider Note   CSN: [id number] Arrival date & time: 07/12/18  2144    History   Chief Complaint Chief Complaint  Patient presents with  . Fatigue    HPI Graylon Amory is a 63 y.o. male.     The history is provided by the patient and a relative.  Illness  Location:  At home Quality:  Feels fatigued and had a low BP for the visiting nurse  Severity:  Moderate Onset quality:  Gradual Duration:  1 day Timing:  Constant Progression:  Unchanged Chronicity:  New Context:  Recently discharged from the hospital for hyperglycemia Relieved by:  Nothing Worsened by:  Nothing Ineffective treatments:  Water Associated symptoms: fatigue   Associated symptoms: no abdominal pain, no chest pain, no congestion, no cough, no diarrhea, no ear pain, no fever, no headaches, no loss of consciousness, no myalgias, no nausea, no rash, no rhinorrhea, no shortness of breath, no sore throat, no vomiting and no wheezing   Risk factors:  Diabeted   Past Medical History:  Diagnosis Date  . Anemia   . Asthma   . Diabetes mellitus without complication ([place])   . Gout   . Hyperlipidemia   . Hypertension   . Schizophrenia ([place]) 07/06/2018    Patient Active Problem List   Diagnosis Date Noted  . Schizophrenia ([place]) 07/06/2018  . Hyperosmolar hyperglycemic coma due to diabetes mellitus without ketoacidosis ([place])   . Type 2 diabetes mellitus with diabetic chronic kidney disease ([place])   . Anemia   . Hyperglycemia 07/04/2018  . Hyperlipidemia 12/18/2017  . 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 Intermittent chest pain 11/25/2017  . Allergic rhinitis with a nonallergic component 07/30/2017  . Food allergy 07/30/2017  . C. difficile diarrhea 05/22/2017  . Chronic gout involving toe of left foot without tophus 05/18/2017  . Hemorrhoids 05/04/2017  . Haloperidol adverse reaction 04/16/2017  . Chronic low back pain 01/03/2017  .  Chronic cough 06/30/2016  . Drug-induced mood disorder ([place]) 06/14/2016  . Type 2 diabetes mellitus with diabetic neuropathy ([place]) 05/26/2016  . Iron deficiency anemia 05/26/2016  . Chronic kidney disease 05/26/2016  . HTN (hypertension) 05/26/2016  . Tobacco abuse 05/26/2016  . History of substance abuse ([place]) 05/26/2016  . History of alcohol abuse 05/26/2016  . Mild persistent asthma 05/26/2016    Past Surgical History:  Procedure Laterality Date  . REPLACEMENT TOTAL KNEE Left   . TOE SURGERY          Home Medications    Prior to Admission medications   Medication Sig Start Date End Date Taking? Authorizing Provider  acetaminophen (TYLENOL) 500 MG tablet Take 1,000 mg by mouth every 6 (six) hours as needed (for pain).   Yes [provider]  albuterol (PROVENTIL HFA;VENTOLIN HFA) 108 (90 Base) MCG/ACT inhaler Inhale 1-2 puffs into the lungs every 6 (six) hours as needed for wheezing or shortness of breath. 03/25/18  Yes Bobbitt, Sedalia Muta, MD  allopurinol (ZYLOPRIM) 100 MG tablet Take 2 tablets (200 mg total) by mouth daily. 01/10/18  Yes Hensel, Jamal Collin, MD  atorvastatin (LIPITOR) 20 MG tablet Take 1 tablet (20 mg total) by mouth daily. 03/07/18  Yes [name], [name], DO  benztropine (COGENTIN) 2 MG tablet Take 2 mg by mouth 2 (two) times daily.   Yes [provider]  divalproex (DEPAKOTE) 500 MG DR tablet Take 500-2,000 mg by mouth See admin instructions. Take 500 mg by  mouth in the morning and 2,000 mg at bedtime   Yes [provider]  ferrous sulfate (FERROUSUL) 325 (65 FE) MG tablet Take 1 tablet (325 mg total) by mouth daily with breakfast. 03/06/18  Yes [name], [name], DO  fluticasone (FLOVENT HFA) 110 MCG/ACT inhaler Inhale 2 puffs into the lungs 2 (two) times daily. Patient taking differently: Inhale 2 puffs into the lungs daily as needed (for flares).  07/30/17  Yes Bobbitt, Sedalia Muta, MD  gabapentin (NEURONTIN) 300 MG capsule Take 1  capsule (300 mg total) by mouth 3 (three) times daily as needed. Patient taking differently: Take 300-600 mg by mouth See admin instructions. Take 300 mg by mouth in the morning and 600 mg at bedtime 05/03/18  Yes [name], [name], DO  glimepiride (AMARYL) 4 MG tablet Take 4 mg by mouth daily. 02/17/18  Yes [provider]  haloperidol (HALDOL) 5 MG tablet Take 5 mg by mouth at bedtime.   Yes [provider]  haloperidol decanoate (HALDOL DECANOATE) 100 MG/ML injection Inject 200 mg into the muscle every 28 (twenty-eight) days.    Yes [provider]  insulin glargine (LANTUS) 100 UNIT/ML injection Inject 0.35 mLs (35 Units total) into the skin daily. Patient taking differently: Inject 35 Units into the skin at bedtime.  07/11/18  Yes Wilber Oliphant, MD  lidocaine (LIDODERM) 5 % Place 1 patch onto the skin daily for 30 doses. Remove & Discard patch within 12 hours or as directed by MD Patient taking differently: Place 1 patch onto the skin daily as needed (for pain). Remove & Discard patch within 12 hours or as directed by MD 06/25/18 07/25/18 Yes Curatolo, Adam, DO  liraglutide (VICTOZA) 18 MG/3ML SOPN Inject 0.3 mLs (1.8 mg total) into the skin daily. Patient taking differently: Inject 0.6 mg into the skin daily with breakfast.  04/09/18  Yes [name], [name], DO  lithium carbonate 300 MG capsule Take 300 mg by mouth 2 (two) times daily with a meal.   Yes [provider]  losartan (COZAAR) 25 MG tablet Take 1 tablet (25 mg total) by mouth daily. Patient taking differently: Take 25 mg by mouth at bedtime.  01/24/18  Yes Hensel, Jamal Collin, MD  Multiple Vitamin (DAILY-VITE) TABS TAKE 1 TABLET BY MOUTH DAILY. 10/03/16  Yes Sela Hilding, MD  omeprazole (PRILOSEC) 20 MG capsule Take 2 capsules (40 mg total) by mouth every evening. 05/16/18  Yes [name], DO  tamsulosin (FLOMAX) 0.4 MG CAPS capsule Take 2 capsules (0.8 mg total) by mouth daily. Patient taking  differently: Take 0.4 mg by mouth at bedtime.  07/11/18  Yes Wilber Oliphant, MD  ACCU-CHEK SOFTCLIX LANCETS lancets use as directed *may use THREE TIMES DAILY to check blood sugar* Patient taking differently: 1 each 3 (three) times daily.  06/28/17   [name], DO  Blood Glucose Monitoring Suppl (ACCU-CHEK AVIVA PLUS) w/Device KIT Use to check blood sugar three times daily or as directed. 05/27/18   [name], DO  finasteride (PROSCAR) 5 MG tablet Take 1 tablet (5 mg total) by mouth daily. 07/11/18   Wilber Oliphant, MD  glucose blood (ACCU-CHEK GUIDE) test strip Use to check blood sugar three times daily or as directed. 05/31/18   [name], DO  Insulin Pen Needle (PEN NEEDLES) 32G X 5 MM MISC 1 pen by Does not apply route daily. 01/24/18   Zenia Resides, MD  NOVOFINE PLUS 32G X 4 MM  MISC 1 Container by Does not apply route daily. 05/18/18   [provider]    Family History Family History  Adopted: Yes  Problem Relation Age of Onset  . Allergic rhinitis Neg Hx   . Angioedema Neg Hx   . Asthma Neg Hx   . Atopy Neg Hx   . Eczema Neg Hx   . Immunodeficiency Neg Hx   . Urticaria Neg Hx     Social History Social History   Tobacco Use  . Smoking status: Current Some Day Smoker    Packs/day: 0.30    Years: 45.00    Pack years: 13.50    Types: Cigarettes    Start date: 05/16/1971  . Smokeless tobacco: Never Used  . Tobacco comment: Decreased Intake to 10 cigarettes per week or less.  Substance Use Topics  . Alcohol use: No  . Drug use: No     Allergies   Chantix [varenicline tartrate]; Ativan [lorazepam]; Shellfish-derived products; and Penicillins   Review of Systems Review of Systems  Constitutional: Positive for fatigue. Negative for diaphoresis and fever.  HENT: Negative for congestion, ear pain, rhinorrhea and sore throat.   Respiratory: Negative for cough, shortness of breath and wheezing.   Cardiovascular: Negative for chest pain, palpitations  and leg swelling.  Gastrointestinal: Negative for abdominal pain, diarrhea, nausea and vomiting.  Genitourinary: Negative for flank pain.  Musculoskeletal: Negative for myalgias.  Skin: Negative for rash.  Neurological: Negative for tremors, loss of consciousness, syncope, facial asymmetry, speech difficulty, light-headedness, numbness and headaches.  All other systems reviewed and are negative.    Physical Exam Updated Vital Signs BP 111/89   Pulse 79   Temp 98.4 F (36.9 C) (Oral)   Resp 16   SpO2 100%   Physical Exam Vitals signs and nursing note reviewed.  Constitutional:      General: He is not in acute distress.    Appearance: Normal appearance.  HENT:     Head: Normocephalic and atraumatic.     Nose: Nose normal.     Mouth/Throat:     Mouth: Mucous membranes are moist.     Pharynx: Oropharynx is clear.  Eyes:     Conjunctiva/sclera: Conjunctivae normal.     Pupils: Pupils are equal, round, and reactive to light.  Neck:     Musculoskeletal: Normal range of motion and neck supple.  Cardiovascular:     Rate and Rhythm: Normal rate and regular rhythm.     Pulses: Normal pulses.     Heart sounds: Normal heart sounds.  Pulmonary:     Effort: Pulmonary effort is normal.     Breath sounds: Normal breath sounds.  Abdominal:     General: Abdomen is flat. Bowel sounds are normal.     Tenderness: There is no abdominal tenderness. There is no guarding or rebound.  Musculoskeletal: Normal range of motion.  Skin:    General: Skin is warm and dry.  Neurological:     General: No focal deficit present.     Mental Status: He is alert.     Cranial Nerves: No cranial nerve deficit.     Coordination: Coordination normal.  Psychiatric:        Mood and Affect: Mood normal.        Behavior: Behavior normal.      ED Treatments / Results  Labs (all labs ordered are listed, but only abnormal results are displayed) Results for orders placed or performed during the hospital  encounter of 07/12/18  Basic metabolic panel  Result Value Ref Range   Sodium 135 135 - 145 mmol/L   Potassium 5.0 3.5 - 5.1 mmol/L   Chloride 106 98 - 111 mmol/L   CO2 22 22 - 32 mmol/L   Glucose, Bld 192 (H) 70 - 99 mg/dL   BUN 21 8 - 23 mg/dL   Creatinine, Ser 1.82 (H) 0.61 - 1.24 mg/dL   Calcium 9.3 8.9 - 10.3 mg/dL   GFR calc non Af Amer 39 (L) >60 mL/min   GFR calc Af Amer 45 (L) >60 mL/min   Anion gap 7 5 - 15  CBC  Result Value Ref Range   WBC 11.7 (H) 4.0 - 10.5 K/uL   RBC 3.67 (L) 4.22 - 5.81 MIL/uL   Hemoglobin 10.3 (L) 13.0 - 17.0 g/dL   HCT 34.6 (L) 39.0 - 52.0 %   MCV 94.3 80.0 - 100.0 fL   MCH 28.1 26.0 - 34.0 pg   MCHC 29.8 (L) 30.0 - 36.0 g/dL   RDW 14.1 11.5 - 15.5 %   Platelets 272 150 - 400 K/uL   nRBC 0.0 0.0 - 0.2 %  Urinalysis, Routine w reflex microscopic  Result Value Ref Range   Color, Urine YELLOW YELLOW   APPearance HAZY (A) CLEAR   Specific Gravity, Urine 1.017 1.005 - 1.030   pH 5.0 5.0 - 8.0   Glucose, UA NEGATIVE NEGATIVE mg/dL   Hgb urine dipstick NEGATIVE NEGATIVE   Bilirubin Urine NEGATIVE NEGATIVE   Ketones, ur NEGATIVE NEGATIVE mg/dL   Protein, ur 30 (A) NEGATIVE mg/dL   Nitrite NEGATIVE NEGATIVE   Leukocytes,Ua SMALL (A) NEGATIVE   RBC / HPF 0-5 0 - 5 RBC/hpf   WBC, UA 11-20 0 - 5 WBC/hpf   Bacteria, UA RARE (A) NONE SEEN   Squamous Epithelial / LPF 0-5 0 - 5   Mucus PRESENT    Hyaline Casts, UA PRESENT   CBG monitoring, ED  Result Value Ref Range   Glucose-Capillary 175 (H) 70 - 99 mg/dL   Dg Chest 2 View  Result Date: 07/13/2018 CLINICAL DATA:  Fatigue EXAM: CHEST - 2 VIEW COMPARISON:  07/04/2018 FINDINGS: Shallow inspiration. Mild cardiac enlargement. Lungs are clear. No blunting of costophrenic angles. No pneumothorax. Mediastinal contours appear intact. IMPRESSION: No active cardiopulmonary disease. Electronically Signed   By: Lucienne Capers M.D.   On: 07/13/2018 02:35   Dg Knee 2 Views Left  Result Date:  06/25/2018 CLINICAL DATA:  Pt c/o knee pain, no injury, seems confused. EXAM: LEFT KNEE - 1-2 VIEW COMPARISON:  None. FINDINGS: Status post LEFT knee arthroplasty. Hardware is intact. No dislocation. No significant joint effusion. No acute fracture. IMPRESSION: 1. No acute findings. 2. Hardware intact. Electronically Signed   By: Nolon Nations M.D.   On: 06/25/2018 16:04   Ct Head Wo Contrast  Result Date: 07/04/2018 CLINICAL DATA:  Slurred speech and lethargy. EXAM: CT HEAD WITHOUT CONTRAST TECHNIQUE: Contiguous axial images were obtained from the base of the skull through the vertex without intravenous contrast. COMPARISON:  05/20/2017 FINDINGS: Brain: Ventricles and cisterns are within normal. Mild age related atrophic change. Subtle chronic ischemic microvascular disease. No mass, mass effect, shift of midline structures or acute hemorrhage. No evidence of acute infarction. Vascular: No hyperdense vessel or unexpected calcification. Skull: Normal. Negative for fracture or focal lesion. Sinuses/Orbits: No acute finding. Other: None. IMPRESSION: No acute findings. Mild age related atrophic change and chronic ischemic microvascular disease. Electronically Signed   By: Quillian Quince  Derrel Nip M.D.   On: 07/04/2018 18:03   Dg Chest Portable 1 View  Result Date: 07/04/2018 CLINICAL DATA:  Hypoglycemia. EXAM: PORTABLE CHEST 1 VIEW COMPARISON:  08/24/2017 FINDINGS: Lungs are hypoinflated with slight elevation of the right hemidiaphragm. No focal lobar consolidation or effusion. Cardiomediastinal silhouette and remainder of the exam is unchanged. IMPRESSION: Hypoinflation without acute cardiopulmonary disease. Electronically Signed   By: Marin Olp M.D.   On: 07/04/2018 17:06   EKG None  Radiology Dg Chest 2 View  Result Date: 07/13/2018 CLINICAL DATA:  Fatigue EXAM: CHEST - 2 VIEW COMPARISON:  07/04/2018 FINDINGS: Shallow inspiration. Mild cardiac enlargement. Lungs are clear. No blunting of costophrenic  angles. No pneumothorax. Mediastinal contours appear intact. IMPRESSION: No active cardiopulmonary disease. Electronically Signed   By: Lucienne Capers M.D.   On: 07/13/2018 02:35    Procedures Procedures (including critical care time)  Medications Ordered in ED Medications  sodium chloride flush (NS) 0.9 % injection 3 mL (has no administration in time range)  sodium chloride 0.9 % bolus 1,000 mL (has no administration in time range)  cefTRIAXone (ROCEPHIN) 1 g in sodium chloride 0.9 % 100 mL IVPB (has no administration in time range)    Seen by family medicine residents who discussed the case with Dr. Gwendlyn Deutscher and patient refuses to be admitted or placed.  Will need antibiotics.  Has a follow up appointment in clinic on Monday at 910 am.    Final Clinical Impressions(s) / ED Diagnoses   Return for pain, intractable cough, fevers >100.4 unrelieved by medication, shortness of breath, intractable vomiting, chest pain, shortness of breath, weakness numbness, changes in speech, facial asymmetry,abdominal pain, passing out,Inability to tolerate liquids or food, cough, altered mental status or any concerns. No signs of systemic illness or infection. The patient is nontoxic-appearing on exam and vital signs are within normal limits.   I have reviewed the triage vital signs and the nursing notes. Pertinent labs &imaging results that were available during my care of the patient were reviewed by me and considered in my medical decision making (see chart for details).  After history, exam, and medical workup I feel the patient has been appropriately medically screened and is safe for discharge home. Pertinent diagnoses were discussed with the patient. Patient was given return precautions.    Elzena Muston, MD 07/13/18 [phone]

## 2018-07-15 ENCOUNTER — Ambulatory Visit: Payer: Medicaid Other | Admitting: Student in an Organized Health Care Education/Training Program

## 2018-07-15 ENCOUNTER — Other Ambulatory Visit: Payer: Self-pay

## 2018-07-15 ENCOUNTER — Telehealth: Payer: Self-pay

## 2018-07-15 VITALS — BP 120/68 | HR 85 | Temp 98.7°F | Ht 73.0 in | Wt 225.0 lb

## 2018-07-15 DIAGNOSIS — N179 Acute kidney failure, unspecified: Secondary | ICD-10-CM | POA: Insufficient documentation

## 2018-07-15 DIAGNOSIS — E1140 Type 2 diabetes mellitus with diabetic neuropathy, unspecified: Secondary | ICD-10-CM | POA: Diagnosis not present

## 2018-07-15 LAB — URINE CULTURE: Culture: 70000 — AB

## 2018-07-15 LAB — GLUCOSE, POCT (MANUAL RESULT ENTRY): POC Glucose: 209 mg/dl — AB (ref 70–99)

## 2018-07-15 NOTE — Telephone Encounter (Signed)
[facility] called for verbal orders:  HH aide, 2 week 3 PT, 2 week 3 Skilled nurse 1w1, 3w1, 2w2, 1w1, 2 PRN  Call [phone] and ask for any patient care manager to leave orders.  Ples Specter, RN [facility] RN)

## 2018-07-15 NOTE — Patient Instructions (Signed)
It was a pleasure seeing you today in our clinic.  Here is the treatment plan we have discussed and agreed upon together:  We drew blood work at today's visit. I will call or send you a letter with these results. If you do not hear from me within the next week, please give our office a call.  Please schedule follow up to be seen in our office in 2 weeks.  Our clinic's number is [phone]. Please call with questions or concerns about what we discussed today.  Be well, Dr. Mosetta Putt

## 2018-07-15 NOTE — Assessment & Plan Note (Signed)
Thought to be due to poor hydration/poor p.o. intake.  We will recheck a BMP today.

## 2018-07-15 NOTE — Progress Notes (Signed)
CC: ED follow up, UTI  HPI: Dylan Jordan is a 63 y.o. male with PMH: Patient Active Problem List   Diagnosis Date Noted  . AKI (acute kidney injury) (HCC) 07/15/2018  . Schizophrenia (HCC) 07/06/2018  . Hyperosmolar hyperglycemic coma due to diabetes mellitus without ketoacidosis (HCC)   . Type 2 diabetes mellitus with diabetic chronic kidney disease (HCC)   . Anemia   . Hyperglycemia 07/04/2018  . Hyperlipidemia 12/18/2017  . 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 Intermittent chest pain 11/25/2017  . Allergic rhinitis with a nonallergic component 07/30/2017  . Food allergy 07/30/2017  . C. difficile diarrhea 05/22/2017  . Chronic gout involving toe of left foot without tophus 05/18/2017  . Hemorrhoids 05/04/2017  . Haloperidol adverse reaction 04/16/2017  . Chronic low back pain 01/03/2017  . Chronic cough 06/30/2016  . Drug-induced mood disorder (HCC) 06/14/2016  . Type 2 diabetes mellitus with diabetic neuropathy (HCC) 05/26/2016  . Iron deficiency anemia 05/26/2016  . Chronic kidney disease 05/26/2016  . HTN (hypertension) 05/26/2016  . Tobacco abuse 05/26/2016  . History of substance abuse (HCC) 05/26/2016  . History of alcohol abuse 05/26/2016  . Mild persistent asthma 05/26/2016   Patient presents for emergency room follow-up.  He was seen on 2/28 in the emergency department for an episode of low blood pressure which was asymptomatic.  He was also noted to have foul-smelling urine and abnormal UA.  He was started on a course of Keflex.  His creatinine was elevated at 1.82 in the emergency department.  There was concern that he was dehydrated.  She was offered admission for SNF placement, however he refused.  His family offered to provide 24-hour care.  He reports that he has been practicing his insulin regimen.  He is able to vocalize his insulin regimen correctly using teach back method in the office today.  He reports that he has not had any  hypoglycemia other than a low of 84 this morning.  He reports that he continues to take his Keflex as prescribed.  He is not having any urinary symptoms.  He reports that he sometimes has blurry vision, however feels that his glasses prescription is not correct.  Review of Symptoms:  See HPI for ROS.   CC, SH/smoking status, and VS noted.  Objective: BP 120/68   Pulse 85   Temp 98.7 F (37.1 C) (Oral)   Ht 6\' 1"  (1.854 m)   Wt 225 lb (102.1 kg)   SpO2 99%   BMI 29.69 kg/m  GEN: NAD, alert, cooperative, and pleasant. EYE: no conjunctival injection, pupils equally round and reactive to light RESPIRATORY: clear to auscultation bilaterally with no wheezes, rhonchi or rales, good effort CV: RRR, no m/r/g GI: soft, non-tender, non-distended SKIN: warm and dry, no rashes or lesions NEURO: II-XII grossly intact, normal gait, peripheral sensation intact PSYCH: AAOx3, unusual affect and demeanor may be baseline  Assessment and plan:  AKI (acute kidney injury) (HCC) Thought to be due to poor hydration/poor p.o. intake.  We will recheck a BMP today.  Type 2 diabetes mellitus with diabetic neuropathy (HCC) Patient is able to vocalize his current insulin regimen using teach back method.  He feels that his sugar may be low today.  We checked in the office and it was 209.  He does have help at home.  We will schedule close follow-up in 2 weeks to continue monitoring his wellbeing at home.   Orders Placed This Encounter  Procedures  .  Basic metabolic panel  . Glucose (CBG)    No orders of the defined types were placed in this encounter.    Howard Pouch, MD,MS,  PGY3 07/15/2018 1:49 PM

## 2018-07-15 NOTE — Assessment & Plan Note (Addendum)
Patient is able to vocalize his current insulin regimen using teach back method.  He feels that his sugar may be low today.  We checked in the office and it was 209.  He does have help at home.  We will schedule close follow-up in 2 weeks to continue monitoring his wellbeing at home.

## 2018-07-16 ENCOUNTER — Telehealth: Payer: Self-pay

## 2018-07-16 LAB — BASIC METABOLIC PANEL
BUN/Creatinine Ratio: 17 (ref 10–24)
BUN: 25 mg/dL (ref 8–27)
CHLORIDE: 109 mmol/L — AB (ref 96–106)
CO2: 19 mmol/L — ABNORMAL LOW (ref 20–29)
Calcium: 9.8 mg/dL (ref 8.6–10.2)
Creatinine, Ser: 1.5 mg/dL — ABNORMAL HIGH (ref 0.76–1.27)
GFR calc non Af Amer: 49 mL/min/{1.73_m2} — ABNORMAL LOW (ref >59)
GFR, EST AFRICAN AMERICAN: 57 mL/min/{1.73_m2} — AB (ref >59)
Glucose: 205 mg/dL — ABNORMAL HIGH (ref 65–99)
Potassium: 5.3 mmol/L — ABNORMAL HIGH (ref 3.5–5.2)
Sodium: 141 mmol/L (ref 134–144)

## 2018-07-16 NOTE — Telephone Encounter (Signed)
Post ED Visit - Positive Culture Follow-up: Unsuccessful Patient Follow-up  Culture assessed and recommendations reviewed by:  []  Enzo Bi, Pharm.D. []  Celedonio Miyamoto, Pharm.D., BCPS AQ-ID []  Garvin Fila, Pharm.D., BCPS []  Georgina Pillion, [address].D., BCPS []  [place], [place].D., BCPS, AAHIVP []  Estella Husk, Pharm.D., BCPS, AAHIVP []  Sherlynn Carbon, PharmD []  Pollyann Samples, PharmD, BCPS Dylan Pharm D Urine culture  []  Patient discharged without antimicrobial prescription and treatment is now indicated []  Organism is resistant to prescribed ED discharge antimicrobial []  Patient with positive blood cultures  Pt needs to stop Cephalexin Unable to contact patient after 3 attempts, letter will be sent to address on file  Jerry Caras 07/16/2018, 10:33 AM

## 2018-07-16 NOTE — Telephone Encounter (Addendum)
N treatment needed for [facility] ED 07/13/2018 Terance Hart PAC. Called pt to stop Cephalexin. Unable to reach pt  Left msg to return call

## 2018-07-16 NOTE — Telephone Encounter (Signed)
Per [name], ok to give VO. I attempted to call [facility] several times, I was not able to get through to anyone. If someone calls back checking the status, ok for VO.

## 2018-07-17 ENCOUNTER — Telehealth: Payer: Self-pay | Admitting: Licensed Clinical Social Worker

## 2018-07-17 ENCOUNTER — Telehealth: Payer: Self-pay | Admitting: Family Medicine

## 2018-07-17 ENCOUNTER — Encounter: Payer: Self-pay | Admitting: Licensed Clinical Social Worker

## 2018-07-17 NOTE — Telephone Encounter (Signed)
Will forward to MD. Makiah Clauson,CMA  

## 2018-07-17 NOTE — Telephone Encounter (Signed)
VO given.

## 2018-07-17 NOTE — Telephone Encounter (Addendum)
  07/17/2018  Type of Service: [facility] Start time: 9:15   End time: 10:00 Total time: 45 minutes  Name: Dylan Jordan MRN: [id number] DOB: 28-Dec-1955  Dylan Jordan is a 63 y.o. male referred by Dr. Darin Engels for concerns with possible food insecurities .  LCSW called patient to assess needs and barriers. Patient is pleasant,very talkative and engaged in conversation.  He lives alone in a sec 8 apartment , has SSI income, receives $81.00 in food benefits, utilizes [employer] and [employer] for transportation needs, and his niece Velna Hatchet is rep payee . Reports PT and OT came to his home this week.  He does not have an Aide but believes he could benefit from the additional assistance with meal prep and help around his apartment due to ambulating with a walker. Patient very appreciative of call from LCSW.   Patient is experiencing food insecurities due to limited income and foodbenefits.  He would like to eat healthier and understands how this impacts his health.  Patient may benefit from and is in agreement to participate in the [facility] foodbox program.He also expressed concerns with meeting IADL's and would like an aide.  Recent life changes: health concerns and difficulty walking Strengths: resourceful, family support from niece, very determined with positive outlook on his health.  Issues discussed: support system, community resource options, and how patient has managed in the past. Intervention: Client interviewed and appropriate assessments performed. Provided client with information about [facility] foodbox program and completed application over the phone, also spent time on engagement with patient.  Other intervention include: (Supportive Counseling; consult PCP for personal care services. Plan:   1. Patient will pick up food box and sign application during his office visit with PCP 2. LCSW will complete top part of personal care services referral and place in PCP's mailbox.  Tillman Sers,  DO and Dr. Darin Engels have been notified of this outreach and Mr. Selma Bryer plan.   Sammuel Hines, LCSW [facility]   [phone] 10:41 AM

## 2018-07-17 NOTE — Telephone Encounter (Signed)
Rosanne Ashing from [facility] is calling to have verbal orders for pt  Occupational therapy  Dylan Jordan   The best call back number is [phone]

## 2018-07-18 ENCOUNTER — Other Ambulatory Visit: Payer: Self-pay | Admitting: Family Medicine

## 2018-07-18 ENCOUNTER — Telehealth: Payer: Self-pay

## 2018-07-18 DIAGNOSIS — E1140 Type 2 diabetes mellitus with diabetic neuropathy, unspecified: Secondary | ICD-10-CM

## 2018-07-18 MED ORDER — INSULIN GLARGINE 100 UNIT/ML ~~LOC~~ SOLN: 30.0000 [IU] | Freq: Every day | SUBCUTANEOUS | 11 refills | Status: DC

## 2018-07-18 NOTE — Telephone Encounter (Signed)
Patient calling to report his blood sugars have been up and down. 69 this am and ranging anywhere from 70-200s, but mostly low.  Thinks his medications need to be adjusted more.  Call back is [phone]  Ples Specter, RN [facility] RN)

## 2018-07-18 NOTE — Telephone Encounter (Signed)
Dr. Raymondo Band is out on vacation until Monday I believe, Dylan Jordan is on 35 units lantus every night, I'd advise going down to 30 units every night as he's having hypoglycemic episodes. If he continues to have low sugars have him call us back.

## 2018-07-18 NOTE — Telephone Encounter (Signed)
Pt informed.  Changed dosage on med list.  Niley Helbig, Maryjo Rochester, CMA

## 2018-07-18 NOTE — Telephone Encounter (Signed)
Attempted to call back, could not get through, please give VO to Oliver if he calls back for the OT.

## 2018-07-18 NOTE — Telephone Encounter (Signed)
After Hours Emergency Line  Dylan Jordan called after hours emergency line regarding his insulin.  He states he is taking Lantus and Victoza and was advised of some medication changes today due to lower blood sugars.  Lantus recommended to be decreased from 35U to 30U.  He denies any current hypoglycemia.  He has been out of his medications and is requesting these to be sent in to his pharmacy at this time. I informed him I will go ahead and send these in.  Advised that he continue to monitor his sugars.  Red flags reviewed.  He was appreciative of the call.   Will route note to PCP as FYI.  Freddrick March MD  [facility] PGY-3

## 2018-07-19 MED ORDER — INSULIN GLARGINE 100 UNIT/ML SOLOSTAR PEN: 30.0000 [IU] | PEN_INJECTOR | Freq: Every day | SUBCUTANEOUS | 12 refills | Status: DC

## 2018-07-19 NOTE — Addendum Note (Signed)
Addended by: Jone Baseman D on: 07/19/2018 03:33 PM   Modules accepted: Orders

## 2018-07-19 NOTE — Telephone Encounter (Signed)
LM with [facility] with order approval.  Dylan Jordan

## 2018-07-19 NOTE — Telephone Encounter (Signed)
Olegario Messier, Pharmacist at [facility], called nurse line stating the pt called requesting a refill on Lantus Solostar. Pharmacist stated this would be a new medication for him and will need a script. Please advise.

## 2018-07-19 NOTE — Telephone Encounter (Signed)
Pts home health nurse called to let us know that pt is requesting to have the solostar pen not the vial.  Also wanted to clarify dose.  Informed that the dose was changed from 35 units daily to 30 units daily.  Verbal received from Dr. Wonda Olds that pt can have the solostar.  Verbally called into [facility] and changed in chart. [place], Maryjo Rochester, CMA

## 2018-07-23 NOTE — Telephone Encounter (Signed)
Patient contacted in follow-up of blood sugar control.  Self-reported blood glucose  233 256 82 125 116 79 96 -- 86 Reviewed strategies for low blood sugar readings < 70 Reports doing well with adherence to medication.  Taking glargine 30 units each evening.  Continues on Victoza 1.2mg  daily.  Doing well without cigarettes.  Only smoked once yesterday.  Next visit is scheduled with Dr. Wonda Olds in three days (Friday).  Follow-up on tobacco and pharmacy clinic visit at that time.

## 2018-07-26 ENCOUNTER — Other Ambulatory Visit: Payer: Self-pay

## 2018-07-26 ENCOUNTER — Ambulatory Visit: Payer: Medicaid Other | Admitting: Family Medicine

## 2018-07-26 ENCOUNTER — Encounter: Payer: Self-pay | Admitting: Family Medicine

## 2018-07-26 VITALS — BP 132/80 | HR 74 | Temp 98.5°F | Ht 73.0 in | Wt 234.0 lb

## 2018-07-26 DIAGNOSIS — T434X5A Adverse effect of butyrophenone and thiothixene neuroleptics, initial encounter: Secondary | ICD-10-CM

## 2018-07-26 DIAGNOSIS — E1122 Type 2 diabetes mellitus with diabetic chronic kidney disease: Secondary | ICD-10-CM

## 2018-07-26 DIAGNOSIS — N3946 Mixed incontinence: Secondary | ICD-10-CM

## 2018-07-26 DIAGNOSIS — N183 Chronic kidney disease, stage 3 unspecified: Secondary | ICD-10-CM

## 2018-07-26 DIAGNOSIS — E1140 Type 2 diabetes mellitus with diabetic neuropathy, unspecified: Secondary | ICD-10-CM

## 2018-07-26 NOTE — Assessment & Plan Note (Addendum)
  CBGs on home glucometer range from 79-400. Continue current regiemn, no changes. lantus 30 units, victoza 1.2 mg, amaryl 4 mg. I do think he can tolerate going up to the 1.8 mg victoza and told him so but he wants to see Dr. Raymondo Band next week to discuss.

## 2018-07-26 NOTE — Assessment & Plan Note (Signed)
  Encouraged patient to continue plan from urology with flomax and finasteride and to follow up with urology. We had stopped his empagliflozin but he is still having urinary frequency and overflow. He has recently completed a course of keflex from ED for presumptive UTI. He denies UTI symptoms today.

## 2018-07-26 NOTE — Assessment & Plan Note (Addendum)
  I called [facility] and spoke with nursing staff who recommended patient come in as a walk in at 8 am any day of the week and state he wants to be seen for a med review due to side effects from medication. Will advise patient of this. The nurse did put a note in the chart that I called and recommended changing haldol to another medication.

## 2018-07-26 NOTE — Patient Instructions (Signed)
  No changes to medications today Lantus 30 units victoza 1.2 mg- talk to Dr Raymondo Band next week about going up to 1.8  Continue urology medications.  We'll talk to [facility] about your haldol  If you have questions or concerns please do not hesitate to call at [phone].  [name], DO PGY-3, [facility] 07/26/2018 2:52 PM

## 2018-07-26 NOTE — Progress Notes (Signed)
    Subjective:    Patient ID: Dylan Jordan, male    DOB: 01-Apr-1956, 63 y.o.   MRN: [id number]   CC: hospital follow up   HPI: admitted to hospital 2/20 with [facility], discharged 2/25. Currently on lantus 20u every night, victoza 1.2 mg daily, amaryl 4 mg daily  His sugars from his glucometer are all over the place. Am numbers fasting can be 140-300, evening numbers can be up to 400 but as low as 79. He reports eating 3 meals a day and snacking, snacking more so now as he is trying to quit smoking entirely. He is trying to drink diet sodas only.  Goes to bed around 10/11 pm and wakes up at 8 am, takes medications when he wakes up and second dose around 7 or 8 pm.   Still urinating a lot. Taking flomax and finasteride. At urology office had a lot of urine left in his bladder after urinating. He was seen in ED end of February and given keflex for UTI. He denies burning with urination.   Haldol- feels like the side effects stack against him and he does not want to be on this medication anymore.   Smoking status reviewed- current smoker  Review of Systems- see HPI   Objective:  BP 132/80   Pulse 74   Temp 98.5 F (36.9 C) (Oral)   Ht 6\' 1"  (1.854 m)   Wt 234 lb (106.1 kg)   SpO2 97%   BMI 30.87 kg/m  Vitals and nursing note reviewed  General: well nourished, in no acute distress HEENT: normocephalic, TM's visualized bilaterally, no scleral icterus or conjunctival pallor, no nasal discharge, moist mucous membranes, good dentition without erythema or discharge noted in posterior oropharynx Neck: supple, non-tender, without lymphadenopathy Cardiac: RRR, clear S1 and S2, no murmurs, rubs, or gallops Respiratory: clear to auscultation bilaterally, no increased work of breathing Abdomen: soft, nontender, nondistended, no masses or organomegaly. Bowel sounds present Extremities: no edema or cyanosis. Warm, well perfused. 2+ radial and PT pulses bilaterally Skin: warm and dry, no rashes  noted Neuro: alert and oriented, no focal deficits   Assessment & Plan:    Type 2 diabetes mellitus with diabetic chronic kidney disease (HCC)  CBGs on home glucometer range from 79-400. Continue current regiemn, no changes. lantus 30 units, victoza 1.2 mg, amaryl 4 mg. I do think he can tolerate going up to the 1.8 mg victoza and told him so but he wants to see Dr. Raymondo Band next week to discuss.   Haloperidol adverse reaction  I called [facility] and spoke with nursing staff who recommended patient come in as a walk in at 8 am any day of the week and state he wants to be seen for a med review due to side effects from medication. Will advise patient of this. The nurse did put a note in the chart that I called and recommended changing haldol to another medication.   Mixed stress and urge urinary incontinence  Encouraged patient to continue plan from urology with flomax and finasteride and to follow up with urology. We had stopped his empagliflozin but he is still having urinary frequency and overflow. He has recently completed a course of keflex from ED for presumptive UTI. He denies UTI symptoms today.    Return in about 1 week (around 08/02/2018), or as needed.   [name], DO Family Medicine Resident PGY-3

## 2018-07-29 ENCOUNTER — Ambulatory Visit: Payer: Self-pay | Admitting: Pharmacist

## 2018-07-30 ENCOUNTER — Telehealth: Payer: Self-pay | Admitting: Family Medicine

## 2018-07-30 NOTE — Telephone Encounter (Signed)
Pt called and said he missed a call from our number but there was no voicemail. He would like to have Dr. Wonda Olds or Dr. Raymondo Band call him to discuss his medications.  The patient was very hard to understand so I was not able to clarify which medications.

## 2018-07-31 ENCOUNTER — Telehealth: Payer: Self-pay | Admitting: Family Medicine

## 2018-07-31 DIAGNOSIS — E1140 Type 2 diabetes mellitus with diabetic neuropathy, unspecified: Secondary | ICD-10-CM

## 2018-07-31 NOTE — Telephone Encounter (Signed)
I called Mr. Menze back. He reports [facility] doctor called him to avoid an in person visit due to coronavirus. They spoke for about 20 minutes and that doctor recommended continuing haldol, which Mr. Inzunza is upset about. The doctor did not want to make any changes while covid is going around.  I do agree with this and told Mr. Surface so.   I do feel we should revisit this issue once things improve with the pandemic. He is agreeable.  [name], DO PGY-3, [facility] 07/31/2018 9:18 PM

## 2018-07-31 NOTE — Telephone Encounter (Signed)
Pt called to cancel his appointment with Dr. Raymondo Band tomorrow due to not being able to come in because he has another appointment. I informed him that Dr. Raymondo Band would have to call him to discuss rescheduling because we are not scheduling appointments for the time being.

## 2018-07-31 NOTE — Telephone Encounter (Signed)
Pt is calling again and said he would like to speak with Dr. Wonda Olds or Dr. Raymondo Band as soon as possible.   The patient continued to say how important it is to talk to someone because it is concerning his medications. Pt was still very hard to understand and when I would ask him a question he just continued to ask to have his doctor call him back.   I also offered him to speak with a nurse for now until I send a message back to the team. He refused.

## 2018-07-31 NOTE — Telephone Encounter (Signed)
Called patient back and he would like to speak with one of his provider's about his medication changes.  He would like to know if you have heard anything from [facility] regarding his haldol changes.  Patient is persistent that speak with a doctor.  Jazmin Hartsell,CMA

## 2018-08-01 ENCOUNTER — Other Ambulatory Visit: Payer: Self-pay | Admitting: Family Medicine

## 2018-08-01 ENCOUNTER — Ambulatory Visit: Payer: Medicaid Other | Admitting: Pharmacist

## 2018-08-01 ENCOUNTER — Telehealth: Payer: Self-pay

## 2018-08-01 DIAGNOSIS — E1140 Type 2 diabetes mellitus with diabetic neuropathy, unspecified: Secondary | ICD-10-CM

## 2018-08-01 MED ORDER — LIRAGLUTIDE 18 MG/3ML ~~LOC~~ SOPN: 1.2000 mg | PEN_INJECTOR | Freq: Every day | SUBCUTANEOUS | Status: DC

## 2018-08-01 NOTE — Telephone Encounter (Signed)
LVM for pt to give us a call back. Will try again later on today. Erik Burkett, CMA  

## 2018-08-01 NOTE — Telephone Encounter (Signed)
Called patient in follow-up of blood sugars and Haldol treatment plan.   Patient reported plan to pick-up and get his "Haldol Dex" shot in the next 24-48 hours.  He is currently trying to schedule a ride with [employer].   His blood sugars are reported as "much improved" on  Liraglutide 1.2mg  daily with Lantus 30 units daily.  He did report a low blood sugar of 62.  He reported appropriate management of the low blood sugar.  We discussed close phone call follow up to adjust his therapy in 1 week.  Patient was grateful for the follow-up.

## 2018-08-05 ENCOUNTER — Other Ambulatory Visit: Payer: Self-pay | Admitting: *Deleted

## 2018-08-05 DIAGNOSIS — E1140 Type 2 diabetes mellitus with diabetic neuropathy, unspecified: Secondary | ICD-10-CM

## 2018-08-05 MED ORDER — PEN NEEDLES 32G X 5 MM MISC: 1.0000 "pen " | Freq: Every day | 2 refills | Status: DC

## 2018-08-05 NOTE — Telephone Encounter (Signed)
Pt is calling because he is having trouble with his needles either being to weak or to strong and he would like [place] to call him in something different. He said that he thought the needle Nova 5 [id number] was the one he thinks he likes but would like to talk to a nurse or doctor to make sure. jw

## 2018-08-08 ENCOUNTER — Other Ambulatory Visit: Payer: Self-pay | Admitting: *Deleted

## 2018-08-08 MED ORDER — FINASTERIDE 5 MG PO TABS: 5.0000 mg | ORAL_TABLET | Freq: Every day | ORAL | 11 refills | Status: DC

## 2018-08-09 ENCOUNTER — Other Ambulatory Visit: Payer: Self-pay

## 2018-08-09 MED ORDER — AMMONIUM LACTATE 12 % EX LOTN: 1.0000 "application " | TOPICAL_LOTION | CUTANEOUS | 0 refills | Status: DC | PRN

## 2018-08-14 ENCOUNTER — Other Ambulatory Visit: Payer: Self-pay

## 2018-08-14 ENCOUNTER — Telehealth: Payer: Self-pay | Admitting: Pharmacist

## 2018-08-14 MED ORDER — INSULIN GLARGINE 100 UNIT/ML SOLOSTAR PEN: 25.0000 [IU] | PEN_INJECTOR | Freq: Every day | SUBCUTANEOUS | 12 refills | Status: DC

## 2018-08-14 MED ORDER — GLUCOSE BLOOD VI STRP: ORAL_STRIP | 12 refills | Status: DC

## 2018-08-14 NOTE — Telephone Encounter (Signed)
Patient called to follow-up on diabetes control.   States he has been doing well.   Reported 6 blood glucose readings < 80 and 3 of those were in the last 2 weeks.   His high readings were reported as 245.   He agreed to decrease his Lantus from current dose of 30 to 25 units once daily.  He verbalized and confirmed understanding of this treatment plan.

## 2018-08-14 NOTE — Telephone Encounter (Signed)
Noted and agree. 

## 2018-08-14 NOTE — Telephone Encounter (Signed)
Patient has declined most home health services. Will hold off on this for now.

## 2018-08-17 ENCOUNTER — Other Ambulatory Visit: Payer: Self-pay | Admitting: Family Medicine

## 2018-08-27 ENCOUNTER — Telehealth: Payer: Self-pay | Admitting: Pharmacist

## 2018-08-27 DIAGNOSIS — E1140 Type 2 diabetes mellitus with diabetic neuropathy, unspecified: Secondary | ICD-10-CM

## 2018-08-27 MED ORDER — LIRAGLUTIDE 18 MG/3ML ~~LOC~~ SOPN: 1.2000 mg | PEN_INJECTOR | Freq: Every day | SUBCUTANEOUS | Status: DC

## 2018-08-27 NOTE — Telephone Encounter (Signed)
Reviewed and agree.

## 2018-08-27 NOTE — Telephone Encounter (Signed)
No anwer, left message for patient - stating I would try again 4/14  Call back on 4/14, patient reported blood glucose readings 75-220 with fasting 75-150 and later day readings 100-220  Patient denied symptoms of hypoglycemia with readings of 75.   Reports drinking ONLY diet soda and NO regular soda.   Agreed that patient should continue same dose of insulin at this time (Lantus 25 units once daily).  Continues on Victoza 1.2 mg daily.     At the end visit, patient reported new "visual floaters", which he expressed some concern.  He reported an eye visit in the last year.  Vision overall is better, likely due to improved blood glucose control.   He did NOT request need for any additional samples today.

## 2018-08-28 ENCOUNTER — Ambulatory Visit: Payer: Medicaid Other | Admitting: Podiatry

## 2018-08-29 ENCOUNTER — Telehealth: Payer: Self-pay | Admitting: Family Medicine

## 2018-08-29 NOTE — Telephone Encounter (Signed)
[facility] Telemedicine Visit  Patient called nurse line regarding question about medication.  The patient is requesting that he be transitioned from haloperidol to another injectable medication.  He reports 'there are better things out that are available' for him at this time.  He receives his psychiatric care through [facility].  He receives haloperidol every 28 days.  He has been on this medication for considerable amount of time.  His primary complaint today is back spasm.  He reports he is eating and drinking well.  He reports his sugars have been okay.  He would primarily like to let Dr. Wonda Olds know of his concerns about the medication and is wondering how best to contact his psychiatrist.  I let the patient know I would contact Dr. Wonda Olds but she is currently working in the hospital is been very busy.  We will plan to call patient back after discussion further with Dr. Wonda Olds and identifying who primary psychiatrist is.

## 2018-08-30 NOTE — Telephone Encounter (Signed)
Unsure of the physician's name, but when I last called [name] the nurse was very helpful and directed me to tell Dylan Jordan to come in to [facility] as a walk in appointment to discuss medication side effects. I believe this did not happen due to pandemic, I believe he was called by the psychiatrist and was told switching therapies right now without adequate in person follow up (given pandemic) was not the best option, to stay the course with haldol and revisit after pandemic is over. I spoke with Dylan Jordan over the phone about this and told him I support this decision but could tell he was not pleased. Ultimately it's up the psychiatrist obviously but I do agree that he would benefit from a switch when the time is right and we can adequately monitor him.

## 2018-09-01 ENCOUNTER — Telehealth: Payer: Self-pay | Admitting: Family Medicine

## 2018-09-01 NOTE — Telephone Encounter (Signed)
**  After Hours/ Emergency Line Call**  Received a call to report that Parthenia Ames wanting to speek to Dr. Raymondo Band for DM mgmt. Difficult to understand speech due to baseline s Endorsing slightly higher blood sugars at home 160 to 270. Also complaining of neuropathy and bilateral extremity swelling. His propping his feet up at night above the level of his heart and this improves the swelling. Denying fevers, SOB. Patient states that he has nothing to be addressed at this time, but would like a call from Dr. Raymondo Band or Dr. Wonda Olds to assist him with further mgmt of these issues. I recommended that patient call back on Monday to follow up on this as well. Red flags discussed.  Will forward to PCP.      Garnette Gunner, MD PGY-2, [facility] 09/01/2018 12:50 AM

## 2018-09-02 ENCOUNTER — Telehealth (INDEPENDENT_AMBULATORY_CARE_PROVIDER_SITE_OTHER): Payer: Medicaid Other | Admitting: Family Medicine

## 2018-09-02 ENCOUNTER — Other Ambulatory Visit: Payer: Self-pay

## 2018-09-02 DIAGNOSIS — M25473 Effusion, unspecified ankle: Secondary | ICD-10-CM

## 2018-09-02 DIAGNOSIS — M7989 Other specified soft tissue disorders: Secondary | ICD-10-CM

## 2018-09-02 DIAGNOSIS — M25579 Pain in unspecified ankle and joints of unspecified foot: Secondary | ICD-10-CM

## 2018-09-02 NOTE — Progress Notes (Signed)
[facility] Telemedicine Visit  Patient consented to have virtual visit. Method of visit: Telephone  Encounter participants: Patient: Dylan Jordan - located at home Provider: Janit Pagan - located at office Others (if applicable):NA  Chief Complaint: Ankle, feet pain and swelling.  HPI:  Ankle Pain   Incident onset: C/O of feet pain b/L ongoing for 2-3 week. Associated with swelling and shooting pain in both feet. Incident location: No trauma. There was no injury mechanism. Pain location: Bilateral feet and ankle pain with swelling. The quality of the pain is described as burning, aching, shooting and stabbing. The pain is at a severity of 8/10 (Pain is better for now). The pain is moderate. The pain has been intermittent since onset. Associated symptoms include tingling. Pertinent negatives include no inability to bear weight, loss of motion or loss of sensation. Associated symptoms comments: Ankle swelling is better when he wakes after elevating his feet, but as soon as he starts walking around, the swelling and the pain worsen. Denies SOB, denies excessive salt intake, denies excessive water intake. The symptoms are aggravated by movement and weight bearing. Treatments tried: OTC arthritis pill, hot shower. The treatment provided moderate relief.  Denies redness of his feet. He had similar presentation in the past, started couple of years back. He has been urinating a lot.   ROS: per HPI  Pertinent PMHx: Problem list reviewed  Exam:  Respiratory: No Resp distress  Assessment/Plan:  Very challenging to converse with the patient. For his ankle/feet swelling and pain, I recommended reduced salt intake, compression stockings, and LL elevation. I advised that I will need him to come in to be seen so that we can check his BP and do a comprehensive eval for his leg swelling.  I advised that I am unable to give him water pills over the telephone. He insisted  that he is already on a water pill, although none listed on file for him. We went over all his medications over and over again till he finally agreed that he is not on water pills. Appointment made for him to come in on Wednesday, that is the earliest available appointment. He otherwise sounds clinically stable, and he may go to the ED if symptoms worsen.   Time spent during visit with patient: 40 minutes

## 2018-09-02 NOTE — Telephone Encounter (Signed)
Contact the patient and he reported feeling tired.  He has been eating candy and drinking regular soda again recently.   He reports his blood sugars are erratic and he realizes his blood sugar is due to his sugar intake.    He has an appointment on Wed in the [facility] to reevaluate.  On Wednesday he feels like he needs a visit for bilateral foot swelling.   He states he has shooting pains in legs and knees.   Encouraged him to keep appointment on Wednesday.

## 2018-09-04 ENCOUNTER — Ambulatory Visit (INDEPENDENT_AMBULATORY_CARE_PROVIDER_SITE_OTHER): Payer: Medicaid Other | Admitting: Family Medicine

## 2018-09-04 ENCOUNTER — Other Ambulatory Visit: Payer: Self-pay

## 2018-09-04 VITALS — BP 122/80 | HR 76 | Temp 97.8°F | Wt 238.4 lb

## 2018-09-04 DIAGNOSIS — M7989 Other specified soft tissue disorders: Secondary | ICD-10-CM | POA: Insufficient documentation

## 2018-09-04 DIAGNOSIS — E1140 Type 2 diabetes mellitus with diabetic neuropathy, unspecified: Secondary | ICD-10-CM | POA: Diagnosis not present

## 2018-09-04 NOTE — Patient Instructions (Signed)
It was great to meet you today! Thank you for letting me participate in your care!  Today, we discussed your left leg swelling and left knee swelling. I have ordered an ultrasound to be done for you tomorrow to rule out a blood clot. I have a very low suspicion that it is a blood clot but I would like to be sure. Your knee swelling and left leg swelling is most likely from venous insufficiency. I advise you get some compression socks and a neoprene knee sleeve. Only wear the knee sleeve when you are active. Wear the compression socks everyday.  Stay home and stay safe!  Be well, Jules Schick, DO PGY-2, [name]

## 2018-09-04 NOTE — Progress Notes (Signed)
     Subjective: No chief complaint on file.  HPI: Dylan Jordan is a 63 y.o. presenting to clinic today to discuss the following:  Pain in his Left Knee with swelling; bilateral feet swelling Both his bilateral feet swelling and left knee pain are chronic issues. He states the pain "shoots from his knee down his leg" and it "feels like a nerve". He denies any trauma or recent injury. He states the pain is the same but is just tired of it. He comes in today hoping we can do something for his pain.    He denies fever, chills, SOB, dyspnea, nausea, vomiting, diarrhea, or constipation.  ROS noted in HPI.   Past Medical, Surgical, Social, and Family History Reviewed & Updated per EMR.   Pertinent Historical Findings include:   Social History   Tobacco Use  Smoking Status Current Some Day Smoker  . Packs/day: 0.30  . Years: 45.00  . Pack years: 13.50  . Types: Cigarettes  . Start date: 05/16/1971  Smokeless Tobacco Never Used  Tobacco Comment   Decreased Intake to 10 cigarettes per week or less.    Objective: BP 122/80 (BP Location: Right Arm)   Pulse 76   Temp 97.8 F (36.6 C)   Wt 238 lb 6.4 oz (108.1 kg)   SpO2 98%   BMI 31.45 kg/m  Vitals and nursing notes reviewed  Physical Exam Gen: Alert and Oriented x 3, NAD HEENT: Normocephalic, atraumatic MSK: No calf pain, no erythema, no disproportionate swelling of the left leg vs right leg. Left knee is slightly swollen and TTP in the suprapatellar region. Neg Ant/Post drawer test, no joint line tenderness, no erythema or obvious deformity. Ext: no clubbing, cyanosis, edbilateral pedal edema, +2 dorsalis pedis pulses Neuro: No gross deficits Skin: warm, dry, intact, no rashes  IMAGING Left Leg DVT U/S: negative for DVT  Assessment/Plan:  Left leg swelling Most likely an acute flare due to a chronic problem. His bilateral feet swelling has been an ongoing issue and most likely due to poor venous return and from patient  being on his feet for much of the day.  - Compression stockings, elevate legs at night - left leg U/S to rule out DVT was negative, low suspicion but given smoking history I wanted to be sure.  Type 2 diabetes mellitus with diabetic neuropathy (HCC) Most likely his knee pain is from his poorly controlled diabetes as he has known diabetic neuropathy. - Cont Gabapentin   PATIENT EDUCATION PROVIDED: See AVS    Diagnosis and plan along with any newly prescribed medication(s) were discussed in detail with this patient today. The patient verbalized understanding and agreed with the plan. Patient advised if symptoms worsen return to clinic or ER.   Compression stockings  Jules Schick, DO 09/04/2018, 2:53 PM PGY-2 [facility]

## 2018-09-05 ENCOUNTER — Ambulatory Visit (HOSPITAL_COMMUNITY)
Admission: RE | Admit: 2018-09-05 | Discharge: 2018-09-05 | Disposition: A | Discharge status: Home / Self Care | Payer: Medicaid Other | Source: Ambulatory Visit | Attending: Cardiology | Admitting: Cardiology

## 2018-09-05 ENCOUNTER — Telehealth: Payer: Self-pay

## 2018-09-05 ENCOUNTER — Other Ambulatory Visit: Payer: Self-pay | Admitting: Family Medicine

## 2018-09-05 DIAGNOSIS — M7989 Other specified soft tissue disorders: Secondary | ICD-10-CM | POA: Diagnosis present

## 2018-09-05 NOTE — Telephone Encounter (Signed)
Vascular lab called nurse line to inform of negative DVT. Provider has been notified.

## 2018-09-06 ENCOUNTER — Telehealth: Payer: Self-pay | Admitting: *Deleted

## 2018-09-06 NOTE — Telephone Encounter (Signed)
Pt calls because he checked with [facility] and they can supply him with compression stockings.  They just need an order faxed to [phone].  Called and LM @ [facility] retail store on [address] for callback.  Would like to verify info above and find out what is needed. Jone Baseman, CMA

## 2018-09-09 ENCOUNTER — Other Ambulatory Visit: Payer: Self-pay | Admitting: Family Medicine

## 2018-09-09 DIAGNOSIS — E1140 Type 2 diabetes mellitus with diabetic neuropathy, unspecified: Secondary | ICD-10-CM

## 2018-09-09 NOTE — Telephone Encounter (Signed)
Dr. Karen Chafe,  If you can place an order for the compression stockings I will fax them over.  Its likely insurance will not pay for them but they can be delivered by [facility] Jone Baseman, CMA

## 2018-09-10 ENCOUNTER — Encounter: Payer: Self-pay | Admitting: Family Medicine

## 2018-09-10 ENCOUNTER — Other Ambulatory Visit: Payer: Self-pay | Admitting: Family Medicine

## 2018-09-10 DIAGNOSIS — M7989 Other specified soft tissue disorders: Secondary | ICD-10-CM

## 2018-09-10 NOTE — Assessment & Plan Note (Signed)
Most likely an acute flare due to a chronic problem. His bilateral feet swelling has been an ongoing issue and most likely due to poor venous return and from patient being on his feet for much of the day.  - Compression stockings, elevate legs at night - left leg U/S to rule out DVT was negative, low suspicion but given smoking history I wanted to be sure.

## 2018-09-10 NOTE — Assessment & Plan Note (Signed)
Most likely his knee pain is from his poorly controlled diabetes as he has known diabetic neuropathy. - Cont Gabapentin

## 2018-09-10 NOTE — Progress Notes (Signed)
Compression stockings order placed so patient can have them delievered

## 2018-09-10 NOTE — Telephone Encounter (Signed)
Contacted to follow up on blood glucose readings.    "A few in the 200s and a few low numbers  94, 97, 79 (denies symptoms of hypoglycemia) No change in current insulin regimen.  Minimal smoking of 3-5 cigarettes per week.    Patient only smoking when others offer cigarettes. Encouraged complete abstinence.    Continues to have swollen feet and complains of foot pain.  Foot swelling is less in the morning.   Explained he should pick up the compression stocking but he complains of lack of money.  I will share this complaint with his PCP.

## 2018-09-10 NOTE — Telephone Encounter (Signed)
-----   Message from Kathrin Ruddy, [facility] sent at 09/03/2018 10:54 AM EDT ----- Regarding: Follow-up RE blood glucose Blood glucose control and tobacco assessment.

## 2018-09-12 ENCOUNTER — Telehealth (INDEPENDENT_AMBULATORY_CARE_PROVIDER_SITE_OTHER): Payer: Medicaid Other

## 2018-09-12 DIAGNOSIS — F209 Schizophrenia, unspecified: Secondary | ICD-10-CM

## 2018-09-12 DIAGNOSIS — M7989 Other specified soft tissue disorders: Secondary | ICD-10-CM

## 2018-09-12 NOTE — Telephone Encounter (Signed)
Message from [place] @ [facility]  Catron, Etta Quill  Shakerra Red, Princella Pellegrini, CMA; Dario Guardian, Lincoln Maxin, Melissa        The fax number to send the order to is [phone]. Once received it will be sent to the retail store to handle. Please include rx, notes, demos and insurance.      Faxed to number provided. Jone Baseman, CMA

## 2018-09-12 NOTE — Telephone Encounter (Signed)
[facility] Telemedicine Visit  Patient consented to have virtual visit. Method of visit: Telephone  Encounter participants: Patient: Dylan Jordan - located at home Provider: Leland Her - located at [facility] Others (if applicable): none  Chief Complaint: mood and leg swelling  HPI:  Patient has been waiting to hear back regarding his treatment plan for his mood medications. He states that he spoke to his doctor and is uncertain about staying on his medications.   He continues to have leg swelling. He received his compression stockings and they are very tight so he has not been able to use them. He would like a second pair to use but is having financial difficulties   ROS: per HPI  Pertinent PMHx: Schizophrenia, type 2 diabetes, hypertension, hyperlipidemia  Exam:  Respiratory: Normal work of breathing, talks in full sentences Psych: Normal thought process and speech  Assessment/Plan: Leg swelling Advised patient put compression stockings on in the early morning before he stands up.  Reviewed that this would be of the most benefit for him as well as given the best chance of getting the compression that he needs.  Patient voiced good understanding.  He would like a second pair of compression stockings but unfortunately is financially limited.  We will send an additional prescription to [facility] via fax  Schizophrenia Per chart review PCP at talked with [facility] about patient staying on his Haldol until need for social distancing is over and then he will need an in person visit at [facility].  Patient informed.  Time spent during visit with patient: 11 minutes  Leland Her, DO PGY-3, [facility] 09/12/2018 11:45 AM

## 2018-09-12 NOTE — Telephone Encounter (Signed)
Reviewed and agree.  I was available to precept.

## 2018-09-13 ENCOUNTER — Telehealth (INDEPENDENT_AMBULATORY_CARE_PROVIDER_SITE_OTHER): Payer: Medicaid Other | Admitting: Family Medicine

## 2018-09-13 ENCOUNTER — Other Ambulatory Visit: Payer: Self-pay | Admitting: Family Medicine

## 2018-09-13 ENCOUNTER — Other Ambulatory Visit: Payer: Self-pay

## 2018-09-13 ENCOUNTER — Telehealth: Payer: Self-pay

## 2018-09-13 ENCOUNTER — Encounter: Payer: Self-pay | Admitting: Family Medicine

## 2018-09-13 VITALS — Wt 238.0 lb

## 2018-09-13 DIAGNOSIS — R0789 Other chest pain: Secondary | ICD-10-CM | POA: Diagnosis not present

## 2018-09-13 DIAGNOSIS — M7989 Other specified soft tissue disorders: Secondary | ICD-10-CM

## 2018-09-13 NOTE — Progress Notes (Signed)
[facility] Telemedicine Visit  Patient consented to have virtual visit. Method of visit: Telephone  Encounter participants: Patient: Dylan Jordan - located at Home Provider: Lovena Neighbours - located at [facility] Others (if applicable): None  Chief Complaint: Lower extremities swelling   HPI: Patient is a 63 yo male with a complex past medical calling about lower extremities swelling. Patient has had worsening swelling for over a week. Patient has had multiple telemedicine visit and one office visit for this problem. DVT was ruled out and patient was advised to use compression stocking which he has not done as recommended. Patient seems to think that swelling is related to his medications. Patient also complained of intermittent chest pain. He currently does not have any chest pain but seems to think that is associated with anxiety. He currently denies any chest pain, abdominal pain, SOB, dizziness or headaches.  ROS: per HPI  Pertinent PMHx:  Schizophrenia, type 2 diabetes, hypertension, hyperlipidemia   Exam:  Cardiac: no palpitations Respiratory: Normal work of breathing, talks in full sentences Psych: Normal thought process and speech  Assessment/Plan:  Swelling of both lower extremities Patient calling to discuss LE swelling which has been ongoing for the past week. It appears patient has worsening swelling despite using compression stocking and elevating legs. He denies any difficulty breathing. Patient has been seen in clinic and has had telemedicine visit the past few days with no significant improvement. Discuss scheduling patient for in office visit for further evaluation. He thinks swelling could be related to one of the medication he is taking for his BPH. Upon further examination of med list patient is on Finasteride which could cause LE swelling. Will discuss at next office visit. For the time being patient will continue will compression stocking.  Please inquiry if patient has picked up compression stocking send to [facility] by Dr. Artist Pais at next office visit.    Time spent during visit with patient: 13 minutes

## 2018-09-13 NOTE — Assessment & Plan Note (Signed)
Patient calling to discuss LE swelling which has been ongoing for the past week. It appears patient has worsening swelling despite using compression stocking and elevating legs. He denies any difficulty breathing. Patient has been seen in clinic and has had telemedicine visit the past few days with no significant improvement. Discuss scheduling patient for in office visit for further evaluation. He thinks swelling could be related to one of the medication he is taking for his BPH. Upon further examination of med list patient is on Finasteride which could cause LE swelling. Will discuss at next office visit. For the time being patient will continue will compression stocking. Please inquiry if patient has picked up compression stocking send to [facility] by Dr. Artist Pais at next office visit.

## 2018-09-13 NOTE — Progress Notes (Signed)
Patient called to start telemed visit.  States that he has been experiencing "chest pain" for the last few days.  "I know it's not my heart but anxiety".  Negative screening questions.  GAD7 performed. Patient doesn't have the equipment at home to check his bp.   Dylan Jordan

## 2018-09-13 NOTE — Telephone Encounter (Signed)
Pt calls nurse line requesting status of compression stockings order by Artist Pais yesterday. I sent a community message to [facility] and they acknowledged they received.    Catron, Audley Hose, CMA; Dario Guardian, Lincoln Maxin, Melissa        i have pulled the order and sent to our retail store. thank you

## 2018-09-18 ENCOUNTER — Other Ambulatory Visit: Payer: Self-pay

## 2018-09-18 ENCOUNTER — Ambulatory Visit (INDEPENDENT_AMBULATORY_CARE_PROVIDER_SITE_OTHER): Payer: Medicaid Other | Admitting: Family Medicine

## 2018-09-18 VITALS — BP 130/70 | HR 87 | Wt 243.4 lb

## 2018-09-18 DIAGNOSIS — M7989 Other specified soft tissue disorders: Secondary | ICD-10-CM

## 2018-09-18 DIAGNOSIS — R079 Chest pain, unspecified: Secondary | ICD-10-CM

## 2018-09-18 DIAGNOSIS — F419 Anxiety disorder, unspecified: Secondary | ICD-10-CM | POA: Diagnosis not present

## 2018-09-18 MED ORDER — FUROSEMIDE 20 MG PO TABS: 20.0000 mg | ORAL_TABLET | Freq: Every day | ORAL | 0 refills | Status: DC

## 2018-09-18 NOTE — Patient Instructions (Addendum)
  We'll check labs today and get an ultrasound of your heart  We'll do a fluid pill for the next 5 days to help the swelling.  I'll see you next Tuesday to check in.  If you have questions or concerns please do not hesitate to call at [phone].  [name], DO PGY-3, [facility] 09/18/2018 2:19 PM

## 2018-09-18 NOTE — Progress Notes (Signed)
    Subjective:    Patient ID: Dylan Jordan, male    DOB: [birth year], 63 y.o.   MRN: [id number]   CC: lower extremity swelling, anxiety  HPI:  LE swelling- patient c/o of LE swelling for the past 2 weeks. He has had several telephone visits regarding this. He had negative dopplers done on 4/23, was advised to get compression stockings. He has not had these delivered to him yet but has confirmed with home health company they have the order and will be sending them to him. He had a negative stress test in August 2019. He has had normal echo in January 2019. He reports he has never had LE swelling before. He noticed it because his socks and shoes were tight. He is now sleeping with his legs elevated and in the morning the swelling is much better. It worsens throughout the day. He denies orthopnea or PND. He denies productive cough. He denies DOE.   Anxiety- had complained of intermittent chest pain on telemedicine visit that patient attributed to anxiety. GAD today 4. His psych medications are managed at [facility]. He reports he has had this chest pain "for years" but it is occurring more often. It can happen randomly no matter what he is doing, laying down, relaxing, watching TV, walking around. It is not accompanied by shortness of breath. He will massage his chest and take deep breaths to calm down to resolve the pain. He does not currently have any chest pain   Smoking status reviewed- trying to stop, 1-2 cigs a day  Review of Systems- see HPI   Objective:  BP 130/70   Pulse 87   Wt 243 lb 6 oz (110.4 kg)   SpO2 97%   BMI 32.11 kg/m  Vitals and nursing note reviewed  General: well nourished, in no acute distress HEENT: normocephalic. N95 mask on. Cardiac: RRR, clear S1 and S2, no murmurs, rubs, or gallops. Anterior chest wall tender to palpation over the left ribs 2-5. Respiratory: clear to auscultation bilaterally, no increased work of breathing Extremities: Warm, well perfused. 2+  radial and PT pulses bilaterally. +2 pitting edema up to mid-shin bilaterally  Skin: warm and dry, no rashes noted Neuro: alert and oriented, slurred speech.  Echo Jan 2019- EF 60-65% no wall motion abnormalities, G1DD noted. Trivial aortic valve regurg. Mild mitral valve calcification of anterior leaflet. Trivial TV regurg. Assessment & Plan:    Swelling of both lower extremities  Unclear cause, would like to r/o heart failure, thyroid dysfunction, liver failure, low albumin. Will obtain labs and repeat echo. Trial of 20 mg lasix daily for next 5 days. Continue elevating legs and once he gets compression stockings instructed to wear them while awake and up. Follow up w/ me 6 days to check in.   Intermittent chest pain  Atypical, normal cardiac work up in August 2019. Patient states this is related to anxiety.   Anxiety  Patient followed by [facility] for schizophrenia. I asked him to please address this with them as he is on several psych medications and I do not feel it is appropriate to make changes at this time.     Return in about 1 week (around 09/25/2018).   [name], DO Family Medicine Resident PGY-3

## 2018-09-18 NOTE — Assessment & Plan Note (Signed)
  Atypical, normal cardiac work up in August 2019. Patient states this is related to anxiety.

## 2018-09-18 NOTE — Assessment & Plan Note (Signed)
  Patient followed by [facility] for schizophrenia. I asked him to please address this with them as he is on several psych medications and I do not feel it is appropriate to make changes at this time.

## 2018-09-18 NOTE — Assessment & Plan Note (Addendum)
  Unclear cause, would like to r/o heart failure, thyroid dysfunction, liver failure, low albumin. Will obtain labs and repeat echo. Trial of 20 mg lasix daily for next 5 days. Continue elevating legs and once he gets compression stockings instructed to wear them while awake and up. Follow up w/ me 6 days to check in.

## 2018-09-19 LAB — COMPREHENSIVE METABOLIC PANEL
ALT: 15 IU/L (ref 0–44)
AST: 14 IU/L (ref 0–40)
Albumin/Globulin Ratio: 1.4 (ref 1.2–2.2)
Albumin: 3.9 g/dL (ref 3.8–4.8)
Alkaline Phosphatase: 60 IU/L (ref 39–117)
BUN/Creatinine Ratio: 10 (ref 10–24)
BUN: 13 mg/dL (ref 8–27)
Bilirubin Total: <0.2 mg/dL (ref 0.0–1.2)
CO2: 19 mmol/L — ABNORMAL LOW (ref 20–29)
Calcium: 9.3 mg/dL (ref 8.6–10.2)
Chloride: 107 mmol/L — ABNORMAL HIGH (ref 96–106)
Creatinine, Ser: 1.28 mg/dL — ABNORMAL HIGH (ref 0.76–1.27)
GFR calc Af Amer: 69 mL/min/{1.73_m2} (ref >59)
GFR calc non Af Amer: 60 mL/min/{1.73_m2} (ref >59)
Globulin, Total: 2.7 g/dL (ref 1.5–4.5)
Glucose: 186 mg/dL — ABNORMAL HIGH (ref 65–99)
Potassium: 4.2 mmol/L (ref 3.5–5.2)
Sodium: 139 mmol/L (ref 134–144)
Total Protein: 6.6 g/dL (ref 6.0–8.5)

## 2018-09-19 LAB — BRAIN NATRIURETIC PEPTIDE: BNP: 10.4 pg/mL (ref 0.0–100.0)

## 2018-09-19 LAB — TSH: TSH: 3.93 u[IU]/mL (ref 0.450–4.500)

## 2018-09-20 NOTE — Progress Notes (Signed)
Labs look good- BNP normal, LFTs/alk phos normal, albumin normal, TSH normal. Would still pursue repeat echo scheduled for next week. Will see patient Tuesday for office visit.

## 2018-09-23 ENCOUNTER — Other Ambulatory Visit: Payer: Self-pay

## 2018-09-23 DIAGNOSIS — E1140 Type 2 diabetes mellitus with diabetic neuropathy, unspecified: Secondary | ICD-10-CM

## 2018-09-23 MED ORDER — ACCU-CHEK AVIVA PLUS W/DEVICE KIT: PACK | 0 refills | Status: DC

## 2018-09-23 MED ORDER — GLUCOSE BLOOD VI STRP: ORAL_STRIP | 12 refills | Status: DC

## 2018-09-23 MED ORDER — ACCU-CHEK SOFTCLIX LANCETS MISC: 1.0000 | Freq: Three times a day (TID) | 9 refills | Status: DC

## 2018-09-23 NOTE — Telephone Encounter (Signed)
Patient states that he has tried to check his sugar multiple times and the machine isn't working.  I sent in a new refill for his machine and also had to reschedule his appt for tomorrow because he realized that he had another appt scheduled.  Patient rescheduled it for Monday since he has his ECHO on Thursday 09-30-2018.  Patient states that he will call scat to make sure he can make this appointment since he denies being notified of it and call us back.  I advised patient that he doesn't have to call back unless he can't make it but patient states that he will call back anyway.  Jazmin Hartsell,CMA

## 2018-09-23 NOTE — Telephone Encounter (Signed)
Pt called nurse line stating his glucometer does not work anymore. Pt stated he has tried all weekend and its reading an error message. Pt stated last time this happened he had to get a new meter. Please send in for patient.

## 2018-09-24 ENCOUNTER — Ambulatory Visit: Payer: Medicaid Other | Admitting: Family Medicine

## 2018-09-24 NOTE — Telephone Encounter (Signed)
Pt called to check status.  LMOVM of [facility] [place] to return call to check status.    LMOVM of pt informing his of status. Jone Baseman, CMA

## 2018-09-25 ENCOUNTER — Other Ambulatory Visit: Payer: Self-pay

## 2018-09-25 ENCOUNTER — Encounter: Payer: Self-pay | Admitting: Podiatry

## 2018-09-25 ENCOUNTER — Other Ambulatory Visit: Payer: Self-pay | Admitting: Family Medicine

## 2018-09-25 ENCOUNTER — Ambulatory Visit: Payer: Medicaid Other | Admitting: Podiatry

## 2018-09-25 DIAGNOSIS — E1142 Type 2 diabetes mellitus with diabetic polyneuropathy: Secondary | ICD-10-CM | POA: Diagnosis not present

## 2018-09-25 DIAGNOSIS — L84 Corns and callosities: Secondary | ICD-10-CM

## 2018-09-25 DIAGNOSIS — B351 Tinea unguium: Secondary | ICD-10-CM

## 2018-09-25 DIAGNOSIS — M79674 Pain in right toe(s): Secondary | ICD-10-CM | POA: Diagnosis not present

## 2018-09-25 DIAGNOSIS — M79675 Pain in left toe(s): Secondary | ICD-10-CM | POA: Diagnosis not present

## 2018-09-25 DIAGNOSIS — E1140 Type 2 diabetes mellitus with diabetic neuropathy, unspecified: Secondary | ICD-10-CM

## 2018-09-26 ENCOUNTER — Ambulatory Visit (HOSPITAL_COMMUNITY): Admission: RE | Admit: 2018-09-26 | Payer: Medicaid Other | Source: Ambulatory Visit

## 2018-09-27 ENCOUNTER — Telehealth: Payer: Self-pay | Admitting: *Deleted

## 2018-09-27 NOTE — Telephone Encounter (Signed)
Pt called because he "was having trouble with his meter this am and did not take Victoza and wants to make sure that's ok".  Attempted to call back. No answer, LMOVM for pt to return call. Jone Baseman, CMA

## 2018-09-30 ENCOUNTER — Encounter: Payer: Self-pay | Admitting: Family Medicine

## 2018-09-30 ENCOUNTER — Ambulatory Visit: Payer: Medicaid Other | Admitting: Allergy and Immunology

## 2018-09-30 ENCOUNTER — Ambulatory Visit (INDEPENDENT_AMBULATORY_CARE_PROVIDER_SITE_OTHER): Payer: Medicaid Other | Admitting: Family Medicine

## 2018-09-30 ENCOUNTER — Other Ambulatory Visit: Payer: Self-pay

## 2018-09-30 VITALS — BP 120/64 | HR 86 | Wt 244.5 lb

## 2018-09-30 DIAGNOSIS — E1140 Type 2 diabetes mellitus with diabetic neuropathy, unspecified: Secondary | ICD-10-CM | POA: Diagnosis not present

## 2018-09-30 DIAGNOSIS — N183 Chronic kidney disease, stage 3 unspecified: Secondary | ICD-10-CM

## 2018-09-30 DIAGNOSIS — M7989 Other specified soft tissue disorders: Secondary | ICD-10-CM | POA: Diagnosis present

## 2018-09-30 DIAGNOSIS — R053 Chronic cough: Secondary | ICD-10-CM

## 2018-09-30 DIAGNOSIS — E1122 Type 2 diabetes mellitus with diabetic chronic kidney disease: Secondary | ICD-10-CM

## 2018-09-30 DIAGNOSIS — R05 Cough: Secondary | ICD-10-CM | POA: Diagnosis not present

## 2018-09-30 LAB — POCT GLYCOSYLATED HEMOGLOBIN (HGB A1C): HbA1c, POC (controlled diabetic range): 7.6 % — AB (ref 0.0–7.0)

## 2018-09-30 NOTE — Patient Instructions (Signed)
  Labs look good A1C was 7.6, no changes Please follow up with [facility] about any psych concerns.  Please get compression socks with anywhere from 8-15 mmHg pressure- do NOT go higher than this as they will be too tight.  You can get covid testing from [facility].  If you have questions or concerns please do not hesitate to call at [phone].  [name], DO PGY-3, [facility] 09/30/2018 2:00 PM

## 2018-09-30 NOTE — Progress Notes (Signed)
    Subjective:    Patient ID: Dylan Jordan, male    DOB: 1956-03-12, 63 y.o.   MRN: [id number]   CC: leg swelling follow up   HPI:  Persistent bilateral leg swelling. Still has not received any compression stockings from home health. He reports his niece- a Engineer, civil (consulting)- got him medical compression stockings for him that go from mid-foot to thigh. He tried to put these on and they were extremely tight and caused more swelling in his toes so he stopped wearing them. He did not go to echo appt as he was "too tired".  He recently had haldol injection and speech is more slurred. He is having difficulty sleeping and reports seeing "shadows" out of the corner of his eyes.  Wants to be tested for covid. He reports he is high risk given his diabetes and asthma. He has chronic cough. He denies fevers. He is 63 years old.   Smoking status reviewed- current smoker  Review of Systems-    Objective:  BP 120/64   Pulse 86   Wt 244 lb 8 oz (110.9 kg)   SpO2 95%   BMI 32.26 kg/m  Vitals and nursing note reviewed  General: well nourished, in no acute distress HEENT: normocephalic, MMM Cardiac: regular rate Respiratory: no increased work of breathing Extremities: +1 pitting edema up to ankles bilaterally  Neuro: slurred speech. AOx3.  Assessment & Plan:    Type 2 diabetes mellitus with diabetic neuropathy (HCC)  A1C 7.6 today, vastly improved. Continue current regimen.   Left leg swelling  Work up negative to date including TSH, CMP, BNP. Patient had echo scheduled and missed appointment. Given normal BMP I do not know if this would be useful. Likely venous stasis. Advised keeping legs elevated and obtaining compression socks with 5-15 mmHg nothing higher.   Chronic cough  Wants to be tested for covid. Unable to order testing as he is asymptomatic. Tried to obtain testing through [facility] for patient but closest location [place]. Will keep him informed as testing guidelines and capabilities  change.    Return in about 4 weeks (around 10/28/2018) for DM.   [name], DO Family Medicine Resident PGY-3

## 2018-09-30 NOTE — Assessment & Plan Note (Signed)
  Work up negative to date including TSH, CMP, BNP. Patient had echo scheduled and missed appointment. Given normal BMP I do not know if this would be useful. Likely venous stasis. Advised keeping legs elevated and obtaining compression socks with 5-15 mmHg nothing higher.

## 2018-09-30 NOTE — Progress Notes (Signed)
a1c

## 2018-09-30 NOTE — Assessment & Plan Note (Signed)
  Wants to be tested for covid. Unable to order testing as he is asymptomatic. Tried to obtain testing through [facility] for patient but closest location [place]. Will keep him informed as testing guidelines and capabilities change.

## 2018-09-30 NOTE — Assessment & Plan Note (Signed)
  A1C 7.6 today, vastly improved. Continue current regimen.

## 2018-10-01 ENCOUNTER — Other Ambulatory Visit: Payer: Self-pay | Admitting: Family Medicine

## 2018-10-01 ENCOUNTER — Ambulatory Visit (INDEPENDENT_AMBULATORY_CARE_PROVIDER_SITE_OTHER): Payer: Medicaid Other | Admitting: Allergy & Immunology

## 2018-10-01 ENCOUNTER — Encounter: Payer: Self-pay | Admitting: Allergy & Immunology

## 2018-10-01 ENCOUNTER — Telehealth: Payer: Self-pay | Admitting: Pharmacist

## 2018-10-01 VITALS — BP 118/72 | HR 90 | Temp 97.1°F | Resp 14

## 2018-10-01 DIAGNOSIS — J4530 Mild persistent asthma, uncomplicated: Secondary | ICD-10-CM

## 2018-10-01 DIAGNOSIS — J3089 Other allergic rhinitis: Secondary | ICD-10-CM | POA: Diagnosis not present

## 2018-10-01 DIAGNOSIS — T7800XD Anaphylactic reaction due to unspecified food, subsequent encounter: Secondary | ICD-10-CM | POA: Diagnosis not present

## 2018-10-01 DIAGNOSIS — Z720 Tobacco use: Secondary | ICD-10-CM

## 2018-10-01 NOTE — Assessment & Plan Note (Signed)
He reports continues to smoke occasionally with family.  His family members are the key source of limited supply of cigarettes.  He denies smoking or buying his own cigarettes.  Encouraged continued complete abstinence.  He is willing to attempt complete cessation.   Agreed that he will attempt to quit completely for 1 month (self-selected goal).

## 2018-10-01 NOTE — Telephone Encounter (Signed)
Patient reports his blood readings are under good control.  His meter is working and he is taking all his medications as prescribed.   He was excited to hear his A1c was under good control at 7.6 yesterday.    He reports continues to smoke occasionally with family.  His family members are the key source of limited supply of cigarettes.  He denies smoking or buying his own cigarettes.  Encouraged continued complete abstinence.  He is willing to attempt complete cessation.   Agreed that he will attempt to quit completely for 1 month (self-selected goal).   I will plan to check in with tobacco cessation in the next 1-2 weeks per patient request.

## 2018-10-01 NOTE — Progress Notes (Signed)
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 Mild persistent asthma without complication - We did do any lung testing since this can spread the coronavirus so effectively.  - Spacer use reviewed. - Daily controller medication(s): Flovent 1 puff twice daily with spacer - Prior to physical activity: albuterol 2 puffs 10-15 minutes before physical activity. - Rescue medications: albuterol 4 puffs every 4-6 hours as needed - Changes during respiratory infections or worsening symptoms: Increase Flovent to 2 puffs twice daily for ONE TO TWO WEEKS. - Asthma control goals:  * Full participation in all desired activities (may need albuterol before activity) * Albuterol use two time or less a week on average (not counting use with activity) * Cough interfering with sleep two time or less a month * Oral steroids no more than once a year * No hospitalizations  2. Allergic rhinitis with a nonallergic component - Continue with Astelin 1 spray per nostril up to twice daily. - Continue with an antihistamine as needed.   3. 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 - EpiPen is up to date.   4. Return in about 6 months (around 04/03/2019). This can be an in-person, a virtual Webex or a telephone follow up visit.   Subjective:   Dylan Jordan is a 63 y.o. male presenting today for follow up of  Chief Complaint  Patient presents with   Asthma    Dylan Jordan has a history of the following: Patient Active Problem List   Diagnosis Date Noted   Anxiety 09/18/2018   Swelling of both lower extremities 09/13/2018   Left leg swelling 09/04/2018   AKI (acute kidney injury) (HCC) 07/15/2018   Schizophrenia (HCC) 07/06/2018   Hyperosmolar  hyperglycemic coma due to diabetes mellitus without ketoacidosis (HCC)    Type 2 diabetes mellitus with diabetic chronic kidney disease (HCC)    Anemia    Hyperlipidemia 12/18/2017   Mixed stress and urge urinary incontinence 11/25/2017   Intermittent chest pain 11/25/2017   Allergic rhinitis with a nonallergic component 07/30/2017   Food allergy 07/30/2017   C. difficile diarrhea 05/22/2017   Chronic gout involving toe of left foot without tophus 05/18/2017   Hemorrhoids 05/04/2017   Haloperidol adverse reaction 04/16/2017   Chronic low back pain 01/03/2017   Chronic cough 06/30/2016   Drug-induced mood disorder (HCC) 06/14/2016   Type 2 diabetes mellitus with diabetic neuropathy (HCC) 05/26/2016   Iron deficiency anemia 05/26/2016   Chronic kidney disease 05/26/2016   HTN (hypertension) 05/26/2016   Tobacco abuse 05/26/2016   History of substance abuse (HCC) 05/26/2016   History of alcohol abuse 05/26/2016   Mild persistent asthma 05/26/2016    History obtained from: chart review and patient, who is overall a poor historian.  Dylan Jordan is a 63 y.o. male presenting for a follow up visit. He was last seen in November 2019 by Dr. Nunzio Cobbs.  At that time, his asthma was well controlled.  He was not using Flovent regularly at all.  For his allergic rhinitis, he was continued on Astelin nasal spray 1 spray per nostril up to twice daily.  He is encouraged to continue his goal to stop smoking.  Since last visit, he seems to have done well.  He is very chatty today, although I can  only understand around 25% of what he is saying.  I am not sure if this is his baseline expressive language level, but regardless, he does not seem to have any concerns.  Asthma/Respiratory Symptom History: He is prescribed Flovent 110 mcg 2 puffs twice daily.  However, when I asked him about the different colors of his medications, he lists quite a rainbow of colors.  He is only supposed to have  an orange inhaler and a red inhaler, but it is rather unclear which when he is actually using.  He does not seem to be using any of them on a regular basis whatsoever.  He has not needed any prednisone or ER visits since last visit.  He tells me that he is not supposed to be on prednisone because of his history of prediabetes.  He has not been in the hospital at all for his asthma.  Allergic Rhinitis Symptom History: He is using his Astelin nasal spray at least once a day.  He has not required any antibiotics since last visit.  He is not on any antihistamines whatsoever.  His testing in March 2019 showed positive to beech pollen and box elder pollen only, including on intradermals.  Food Allergy Symptom History: He does have positive skin testing to pineapple via testing in March 2019. He also tells me that he has reacted to shellfish and soy, although later in the conversation it seems that he eats this regularly anyway.  It is unclear whether he had testing in the distant past which was positive or whether he thinks that he had an allergy to soy and shellfish.  Review of his testing shows he was only ever positive to pineapple.  Otherwise, there have been no changes to his past medical history, surgical history, family history, or social history.    Review of Systems  Constitutional: Negative.  Negative for chills, fever, malaise/fatigue and weight loss.  HENT: Negative.  Negative for congestion, ear discharge and ear pain.   Eyes: Negative for pain, discharge and redness.  Respiratory: Negative for cough, sputum production, shortness of breath and wheezing.   Cardiovascular: Negative.  Negative for chest pain and palpitations.  Gastrointestinal: Negative for abdominal pain, heartburn, nausea and vomiting.  Skin: Negative.  Negative for itching and rash.  Neurological: Negative for dizziness and headaches.  Endo/Heme/Allergies: Negative for environmental allergies. Does not bruise/bleed easily.         Objective:   Blood pressure 118/72, pulse 90, temperature (!) 97.1 F (36.2 C), temperature source Temporal, resp. rate 14, SpO2 98 %. There is no height or weight on file to calculate BMI.   Physical Exam:  Physical Exam  Constitutional: He appears well-developed.  Very talkative male.  HENT:  Head: Normocephalic and atraumatic.  Right Ear: Tympanic membrane, external ear and ear canal normal.  Left Ear: Tympanic membrane, external ear and ear canal normal.  Nose: Rhinorrhea present. No mucosal edema, nasal deformity or septal deviation. No epistaxis. Right sinus exhibits no maxillary sinus tenderness and no frontal sinus tenderness. Left sinus exhibits no maxillary sinus tenderness and no frontal sinus tenderness.  Mouth/Throat: Uvula is midline and oropharynx is clear and moist. Mucous membranes are not pale and not dry.  Eyes: Pupils are equal, round, and reactive to light. Conjunctivae and EOM are normal. Right eye exhibits no chemosis and no discharge. Left eye exhibits no chemosis and no discharge. Right conjunctiva is not injected. Left conjunctiva is not injected.  Cardiovascular: Normal rate, regular rhythm and  normal heart sounds.  Respiratory: Effort normal and breath sounds normal. No accessory muscle usage. No tachypnea. No respiratory distress. He has no wheezes. He has no rhonchi. He has no rales. He exhibits no tenderness.  Moving air well in all lung fields.  No increased work of breathing.  Lymphadenopathy:    He has no cervical adenopathy.  Neurological: He is alert.  Skin: No abrasion, no petechiae and no rash noted. Rash is not papular, not vesicular and not urticarial. No erythema. No pallor.  Psychiatric: He has a normal mood and affect.     Diagnostic studies: none     Malachi BondsJoel Luna Audia, MD  [facility]

## 2018-10-01 NOTE — Patient Instructions (Addendum)
1. Mild persistent asthma without complication - We did do any lung testing since this can spread the coronavirus so effectively.  - Spacer use reviewed. - Daily controller medication(s): Flovent 1 puff twice daily with spacer - Prior to physical activity: albuterol 2 puffs 10-15 minutes before physical activity. - Rescue medications: albuterol 4 puffs every 4-6 hours as needed - Changes during respiratory infections or worsening symptoms: Increase Flovent to 2 puffs twice daily for ONE TO TWO WEEKS. - Asthma control goals:  * Full participation in all desired activities (may need albuterol before activity) * Albuterol use two time or less a week on average (not counting use with activity) * Cough interfering with sleep two time or less a month * Oral steroids no more than once a year * No hospitalizations  2. Allergic rhinitis with a nonallergic component - Continue with Astelin 1 spray per nostril up to twice daily. - Continue with an antihistamine as needed.   3. 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 - EpiPen is up to date.   4. Return in about 6 months (around 04/03/2019). This can be an in-person, a virtual Webex or a telephone follow up visit.   Please inform us of any Emergency Department visits, hospitalizations, or changes in symptoms. Call us before going to the ED for breathing or allergy symptoms since we might be able to fit you in for a sick visit. Feel free to contact us anytime with any questions, problems, or concerns.  It was a pleasure to meet you today!  Websites that have reliable patient information: 1. American Academy of Asthma, Allergy, and Immunology: www.aaaai.org 2. Food Allergy Research and Education (FARE): foodallergy.org 3. Mothers of Asthmatics: http://www.asthmacommunitynetwork.org 4. American College of Allergy, Asthma, and Immunology: www.acaai.org  "Like" [place] on Facebook and Instagram  for our latest updates!      Make sure you are registered to vote! If you have moved or changed any of your contact information, you will need to get this updated before voting!    Voter ID laws are NOT going into effect for the General Election in November 2020! DO NOT let this stop you from exercising your right to vote!

## 2018-10-03 ENCOUNTER — Encounter: Payer: Self-pay | Admitting: Podiatry

## 2018-10-03 ENCOUNTER — Ambulatory Visit (HOSPITAL_COMMUNITY)
Admission: RE | Admit: 2018-10-03 | Discharge: 2018-10-03 | Disposition: A | Discharge status: Home / Self Care | Payer: Medicaid Other | Source: Ambulatory Visit | Attending: Family Medicine | Admitting: Family Medicine

## 2018-10-03 ENCOUNTER — Other Ambulatory Visit: Payer: Self-pay

## 2018-10-03 DIAGNOSIS — M7989 Other specified soft tissue disorders: Secondary | ICD-10-CM

## 2018-10-03 DIAGNOSIS — R079 Chest pain, unspecified: Secondary | ICD-10-CM | POA: Diagnosis not present

## 2018-10-03 NOTE — Progress Notes (Signed)
  Echocardiogram 2D Echocardiogram has been performed.  Delcie Roch 10/03/2018, 2:47 PM

## 2018-10-03 NOTE — Progress Notes (Signed)
Subjective: Dylan Jordan is a 63 y.o. y.o. male who presents today for preventative diabetic foot care.  He is seen for painful, discolored, thick toenails and corn of the left fifth digit.  His pain is aggravated when wearing enclosed shoe gear and relieved with periodic professional debridement.  [name], DO is his PCP.   Current Outpatient Medications:  .  Accu-Chek Softclix Lancets lancets, 1 each by Other route 3 (three) times daily., Disp: 100 each, Rfl: 9 .  acetaminophen (TYLENOL) 500 MG tablet, Take 1,000 mg by mouth every 6 (six) hours as needed (for pain)., Disp: , Rfl:  .  albuterol (PROVENTIL HFA;VENTOLIN HFA) 108 (90 Base) MCG/ACT inhaler, Inhale 1-2 puffs into the lungs every 6 (six) hours as needed for wheezing or shortness of breath., Disp: 1 Inhaler, Rfl: 2 .  allopurinol (ZYLOPRIM) 100 MG tablet, Take 2 tablets (200 mg total) by mouth daily., Disp: 180 tablet, Rfl: 3 .  ammonium lactate (AMLACTIN) 12 % lotion, Apply 1 application topically as needed for dry skin., Disp: 400 g, Rfl: 0 .  atorvastatin (LIPITOR) 20 MG tablet, TAKE 1 TABLET BY MOUTH EVERY DAY, Disp: 30 tablet, Rfl: 4 .  BD PEN NEEDLE NANO U/F 32G X 4 MM MISC, USE TO INJECT insulin EVERY DAY, Disp: 100 each, Rfl: 2 .  benztropine (COGENTIN) 2 MG tablet, Take 2 mg by mouth 2 (two) times daily., Disp: , Rfl:  .  Blood Glucose Monitoring Suppl (ACCU-CHEK AVIVA PLUS) w/Device KIT, Use to check blood sugar three times daily or as directed., Disp: 1 kit, Rfl: 0 .  divalproex (DEPAKOTE) 500 MG DR tablet, Take 500-2,000 mg by mouth See admin instructions. Take 500 mg by mouth in the morning and 2,000 mg at bedtime, Disp: , Rfl:  .  ferrous sulfate (FERROUSUL) 325 (65 FE) MG tablet, Take 1 tablet (325 mg total) by mouth daily with breakfast., Disp: 90 tablet, Rfl: 3 .  finasteride (PROSCAR) 5 MG tablet, Take 1 tablet (5 mg total) by mouth daily., Disp: 30 tablet, Rfl: 11 .  fluticasone (FLOVENT HFA) 110 MCG/ACT  inhaler, Inhale 2 puffs into the lungs 2 (two) times daily. (Patient taking differently: Inhale 2 puffs into the lungs daily as needed (for flares). ), Disp: 1 Inhaler, Rfl: 5 .  furosemide (LASIX) 20 MG tablet, Take 1 tablet (20 mg total) by mouth daily., Disp: 5 tablet, Rfl: 0 .  gabapentin (NEURONTIN) 300 MG capsule, TAKE 1 CAPSULE BY MOUTH EVERY MORNING AND 2 capsules AT BEDTIME, Disp: 90 capsule, Rfl: 1 .  glimepiride (AMARYL) 4 MG tablet, Take 4 mg by mouth daily., Disp: , Rfl:  .  glucose blood (ACCU-CHEK AVIVA PLUS) test strip, Dispense QS for twice daily testing., Disp: 100 each, Rfl: 12 .  haloperidol (HALDOL) 5 MG tablet, Take 5 mg by mouth at bedtime., Disp: , Rfl:  .  haloperidol decanoate (HALDOL DECANOATE) 100 MG/ML injection, Inject 200 mg into the muscle every 28 (twenty-eight) days. , Disp: , Rfl:  .  Insulin Glargine (LANTUS SOLOSTAR) 100 UNIT/ML Solostar Pen, Inject 25 Units into the skin daily., Disp: 3 pen, Rfl: 12 .  lithium carbonate 300 MG capsule, Take 300 mg by mouth 2 (two) times daily with a meal., Disp: , Rfl:  .  losartan (COZAAR) 25 MG tablet, Take 1 tablet (25 mg total) by mouth daily. (Patient taking differently: Take 25 mg by mouth at bedtime. ), Disp: 90 tablet, Rfl: 2 .  Multiple Vitamin (DAILY-VITE) TABS, TAKE  1 TABLET BY MOUTH DAILY., Disp: 30 tablet, Rfl: 11 .  NOVOFINE PLUS 32G X 4 MM MISC, 1 Container by Does not apply route daily., Disp: , Rfl:  .  omeprazole (PRILOSEC) 20 MG capsule, Take 2 capsules (40 mg total) by mouth every evening., Disp: 60 capsule, Rfl: 2 .  oxybutynin (DITROPAN-XL) 10 MG 24 hr tablet, Take 10 mg by mouth daily., Disp: , Rfl:  .  Probiotic Product (CULTURELLE PROBIOTICS) CHEW, Chew 1 capsule by mouth 4 (four) times daily., Disp: 90 tablet, Rfl: 0 .  tamsulosin (FLOMAX) 0.4 MG CAPS capsule, Take 2 capsules (0.8 mg total) by mouth daily. (Patient taking differently: Take 0.4 mg by mouth at bedtime. ), Disp: 30 capsule, Rfl: 0 .   VICTOZA 18 MG/3ML SOPN, INJECT 0.3ML (1.16m) INTO THE SKIN DAILY, Disp: 9 mL, Rfl: 3  Allergies  Allergen Reactions  . Chantix [Varenicline Tartrate] Other (See Comments)    Nightmares  . Ativan [Lorazepam] Other (See Comments)    Agitation (and "5 others like it") [facility] [phone] [phone] fax a release-   . Shellfish-Derived Products Itching  . Penicillins Diarrhea    Has patient had a PCN reaction causing immediate rash, facial/tongue/throat swelling, SOB or lightheadedness with hypotension: No Has patient had a PCN reaction causing severe rash involving mucus membranes or skin necrosis: No Has patient had a PCN reaction that required hospitalization: No Has patient had a PCN reaction occurring within the last 10 years: No If all of the above answers are "NO", then may proceed with Cephalosporin use.     Objective: Temperature 97.3 F Vascular Examination: Capillary refill time immediate x10 digits.  Dorsalis pedis pulses palpable bilaterally.  Posterior tibial pulses palpable bilaterally.  Digital hair sparse x 10 digits.  Skin temperature gradient WNL b/l.  Dermatological Examination: Skin with normal turgor, texture and tone b/l.  Toenails 1-5 b/l discolored, thick, dystrophic with subungual debris and pain with palpation to nailbeds due to thickness of nails.  Hyperkeratotic lesion dorsal PIPJ left fifth digit. No erythema, no edema, no drainage, no flocculence noted.   Musculoskeletal: Muscle strength 5/5 to all LE muscle groups.  Hallux abductovalgus with bunion deformity bilaterally.  Hammertoe deformity fifth digit bilaterally  Neurological: Sensation intact with 10 gram monofilament.  Vibratory sensation intact.  Assessment: 1. Painful onychomycosis toenails 1-5 b/l 2. Corn left fifth digit 3. NIDDM  Plan: 1. Continue diabetic foot care principles. Literature dispensed on  today. 2. Toenails 1-5 b/l were debrided in length and girth without iatrogenic bleeding. 3. Hyperkeratotic lesion(s) left fifth digit pared with sterile scalpel blade without incident. 4. Patient to continue soft, supportive shoe gear daily. 5. Patient to report any pedal injuries to medical professional immediately. 6. Follow up 10 weeks. 7. Patient/POA to call should there be a concern in the interim.

## 2018-10-04 NOTE — Progress Notes (Signed)
Please let Mr. Rill know his echo looks ok, no reason for leg swelling seen on this test. He should wear compression stockings as we discussed at length at our last visit. Thank you!

## 2018-10-10 ENCOUNTER — Telehealth: Payer: Self-pay | Admitting: Pharmacist

## 2018-10-10 NOTE — Telephone Encounter (Signed)
Contacted patient to follow-up with blood glucose control.  Patient reported he had a "HI" reading last night.  He explained that he had this reading immediately after drinking TWO regular sodas.   His blood sugar this AM was improved at 147.    He also continues complain of lower extremity swelling that worsens throughout the day and improves with elevation.  He is willing to limit salt foods AND elevate legs throughout the day when resting.   He also reported complete abstinence from tobacco for 3 days.  I was congratulatory AND encouraged continued abstinence.     No change to medication therapy today.  Plan F/U in 1 week.

## 2018-10-12 ENCOUNTER — Other Ambulatory Visit: Payer: Self-pay | Admitting: Family Medicine

## 2018-10-17 ENCOUNTER — Telehealth: Payer: Self-pay

## 2018-10-17 ENCOUNTER — Telehealth: Payer: Self-pay | Admitting: Pharmacist

## 2018-10-17 NOTE — Telephone Encounter (Signed)
-----   Message from Kathrin Ruddy, [facility] sent at 10/10/2018  2:09 PM EDT ----- Regarding: Dm follow-up  Tobacco Cessation

## 2018-10-17 NOTE — Telephone Encounter (Signed)
Patient reported that he has purchased 2 packs of cigarettes since last contact.    He reported that he has had stress in his environment and has been stressed more than usual.  He watches TV and is in apartment more routinely.   He did report that he is using his newly purchased compression stockings with success.  He also reports he is elevating his legs multiple time per day.  He states his swelling is improved in his feet.   Blood sugar readings have been 75-200s (mostly in the 200s).  He admits to dietary non-adherence.   Plan to follow-up again in 1 week.

## 2018-10-17 NOTE — Telephone Encounter (Signed)
Yes please let him know to wear during day when awake. Thanks

## 2018-10-17 NOTE — Telephone Encounter (Signed)
Pt calling to let Dr. Cherie Dark know he found and bought some compression socks. Should he wear them everyday?  I told him to wear them everyday unless he hears different from Dr. Cherie Dark. Dylan Jordan, CMA

## 2018-10-18 NOTE — Telephone Encounter (Signed)
Informed pt. Dylan Jordan Dylan Jordan, CMA  

## 2018-10-24 ENCOUNTER — Telehealth: Payer: Self-pay | Admitting: Pharmacist

## 2018-10-24 NOTE — Telephone Encounter (Signed)
-----   Message from Leavy Cella, [facility] sent at 10/17/2018  4:22 PM EDT ----- Regarding: Tobacco Cessaton Dm f/U as well

## 2018-10-24 NOTE — Telephone Encounter (Signed)
No answer, left message.   Plan to call back in 4-5 days (early next week).

## 2018-10-31 ENCOUNTER — Telehealth: Payer: Self-pay | Admitting: Pharmacist

## 2018-10-31 NOTE — Telephone Encounter (Signed)
Contacted patient to follow up on tobacco cessation and diabetes.   Notes that he is still smoking 6-7 cigarettes per day. Notes that he will finish the cigarette supply he current has, then will quit.   Reports a few readings in the 200s due to drinking regular sodas, but most of his readings in 130-160s. Discussed needing to quit drinking regular sodas, switch to water and diets.  Does ask about some changes in his vision, including some floaters. We discussed that he needs to schedule an eye exam, and to contact clinic if vision gets worse.  Will call patient in 2 weeks to follow up on tobacco cessation and diabetes.

## 2018-11-01 NOTE — Telephone Encounter (Signed)
Noted thanks for calling him

## 2018-11-07 ENCOUNTER — Other Ambulatory Visit: Payer: Self-pay | Admitting: Family Medicine

## 2018-11-08 ENCOUNTER — Telehealth: Payer: Self-pay | Admitting: *Deleted

## 2018-11-08 NOTE — Telephone Encounter (Signed)
We mentioned some of the new antipsychotic medications, vraylar is one of them and I think would be a good option.

## 2018-11-08 NOTE — Telephone Encounter (Signed)
Pt informed.  Barak Bialecki, CMA  

## 2018-11-08 NOTE — Telephone Encounter (Signed)
Pt states that his therapist suggested vraylar and he is to discuss this medication with a new psychiatrist on Monday.  He thinks that Dr. [name] and Dr. Valentina Lucks mentioned another medication but cant remember the name.  Wants to know so that he can mention it to his new psychaitrist. [name], CMA

## 2018-11-11 ENCOUNTER — Telehealth: Payer: Self-pay | Admitting: *Deleted

## 2018-11-11 NOTE — Telephone Encounter (Signed)
Patient reports NO Smoking for 3-4 days.   Congratulated on success.  Encouraged continued abstinence.   Blood sugars reported 73 - 335  This AM blood sugar was reported  225  Discussed current regimen of glimepiride 2mg , Lantus 25 units daily AND Vicotoza 1.8 mg daily  No changes made today - reeducated on drinking "mostly water" He will leave town for a 1 week from July 1st through July 8th.   Plan phone call F/U on July 9th (He was saddened to hear Dr. [name] was leaving and he understands he will have a new doctor).    Rodey to clarify dosing of Lantus 25 units once daily AND Liraglutide (Victoza) 1.8mg  once daily.  Verified current prescription available for both.

## 2018-11-11 NOTE — Telephone Encounter (Signed)
Thanks Dr Koval 

## 2018-11-11 NOTE — Telephone Encounter (Signed)
Received call from:  1. Patient.  Asking for a call from [place] to discuss his sugars.  States that sometimes they are high and I need to speak with him  2. [facility]: They need to verify that pt is on both lantus and victoza.  [name], CMA

## 2018-11-14 ENCOUNTER — Telehealth: Payer: Self-pay | Admitting: Pharmacist

## 2018-11-14 NOTE — Telephone Encounter (Signed)
Blood sugar follow-up: Patient reported good control with high 100s and low 200s.    Tobacco Abuse: reports complete abstinence X 7 days.  Encouraged continued abstinence.    Follow-up planned for 1 week.

## 2018-11-22 ENCOUNTER — Telehealth: Payer: Self-pay | Admitting: Pharmacist

## 2018-11-22 NOTE — Telephone Encounter (Signed)
Attempted phone call follow up RE DM and tobacco cessation.  Left message.   Plan phone follow up in 3-5 days

## 2018-11-22 NOTE — Telephone Encounter (Signed)
Patient Dylan Jordan on nurse line stating he was sorry he missed Dr. Cline Crock call, however he was dealing with an important manner. He will wait until next week to call back again.

## 2018-11-22 NOTE — Telephone Encounter (Signed)
-----   Message from Leavy Cella, [facility] sent at 11/14/2018  1:12 PM EDT ----- Regarding: tobacco abuse and diabetes

## 2018-11-28 ENCOUNTER — Telehealth: Payer: Self-pay | Admitting: Pharmacist

## 2018-11-28 NOTE — Telephone Encounter (Signed)
Noted and agree. 

## 2018-11-28 NOTE — Telephone Encounter (Signed)
-----   Message from Leavy Cella, [facility] sent at 11/22/2018  3:31 PM EDT ----- Regarding: Tobacco Cessation - DM follow up

## 2018-11-28 NOTE — Telephone Encounter (Signed)
Follow-up with patient RE tobacco cessation - reports continued abstinence from tobacco.  Typical triggers of meals, first AM and bedtime.   Encouraged continued abstinence.   Diet - drinking mostly diet soda and water  Patient reports "blood sugars are not well controlled".  Readings reported in the high 100s and 200s.  He reports no change in insulin dose, however he reduced his Victoza from 1.8 to 1.2 -Advised to increase Victoza back to 1.8mg  daily.   Follow up in 1 week.

## 2018-12-04 ENCOUNTER — Encounter: Payer: Self-pay | Admitting: Podiatry

## 2018-12-04 ENCOUNTER — Other Ambulatory Visit: Payer: Self-pay

## 2018-12-04 ENCOUNTER — Ambulatory Visit (INDEPENDENT_AMBULATORY_CARE_PROVIDER_SITE_OTHER): Payer: Medicaid Other | Admitting: Podiatry

## 2018-12-04 DIAGNOSIS — M2042 Other hammer toe(s) (acquired), left foot: Secondary | ICD-10-CM

## 2018-12-04 DIAGNOSIS — M79609 Pain in unspecified limb: Secondary | ICD-10-CM

## 2018-12-04 DIAGNOSIS — E1142 Type 2 diabetes mellitus with diabetic polyneuropathy: Secondary | ICD-10-CM | POA: Diagnosis not present

## 2018-12-04 DIAGNOSIS — M2041 Other hammer toe(s) (acquired), right foot: Secondary | ICD-10-CM

## 2018-12-04 DIAGNOSIS — M79676 Pain in unspecified toe(s): Secondary | ICD-10-CM

## 2018-12-04 DIAGNOSIS — B351 Tinea unguium: Secondary | ICD-10-CM

## 2018-12-04 DIAGNOSIS — L84 Corns and callosities: Secondary | ICD-10-CM

## 2018-12-04 NOTE — Patient Instructions (Signed)
Corns and Calluses Corns are small areas of thickened skin that occur on the top, sides, or tip of a toe. They contain a cone-shaped core with a point that can press on a nerve below. This causes pain.  Calluses are areas of thickened skin that can occur anywhere on the body, including the hands, fingers, palms, soles of the feet, and heels. Calluses are usually larger than corns. What are the causes? Corns and calluses are caused by rubbing (friction) or pressure, such as from shoes that are too tight or do not fit properly. What increases the risk? Corns are more likely to develop in people who have misshapen toes (toe deformities), such as hammer toes. Calluses can occur with friction to any area of the skin. They are more likely to develop in people who:  Work with their hands.  Wear shoes that fit poorly, are too tight, or are high-heeled.  Have toe deformities. What are the signs or symptoms? Symptoms of a corn or callus include:  A hard growth on the skin.  Pain or tenderness under the skin.  Redness and swelling.  Increased discomfort while wearing tight-fitting shoes, if your feet are affected. If a corn or callus becomes infected, symptoms may include:  Redness and swelling that gets worse.  Pain.  Fluid, blood, or pus draining from the corn or callus. How is this diagnosed? Corns and calluses may be diagnosed based on your symptoms, your medical history, and a physical exam. How is this treated? Treatment for corns and calluses may include:  Removing the cause of the friction or pressure. This may involve: ? Changing your shoes. ? Wearing shoe inserts (orthotics) or other protective layers in your shoes, such as a corn pad. ? Wearing gloves.  Applying medicine to the skin (topical medicine) to help soften skin in the hardened, thickened areas.  Removing layers of dead skin with a file to reduce the size of the corn or callus.  Removing the corn or callus with a  scalpel or laser.  Taking antibiotic medicines, if your corn or callus is infected.  Having surgery, if a toe deformity is the cause. Follow these instructions at home:   Take over-the-counter and prescription medicines only as told by your health care provider.  If you were prescribed an antibiotic, take it as told by your health care provider. Do not stop taking it even if your condition starts to improve.  Wear shoes that fit well. Avoid wearing high-heeled shoes and shoes that are too tight or too loose.  Wear any padding, protective layers, gloves, or orthotics as told by your health care provider.  Soak your hands or feet and then use a file or pumice stone to soften your corn or callus. Do this as told by your health care provider.  Check your corn or callus every day for symptoms of infection. Contact a health care provider if you:  Notice that your symptoms do not improve with treatment.  Have redness or swelling that gets worse.  Notice that your corn or callus becomes painful.  Have fluid, blood, or pus coming from your corn or callus.  Have new symptoms. Summary  Corns are small areas of thickened skin that occur on the top, sides, or tip of a toe.  Calluses are areas of thickened skin that can occur anywhere on the body, including the hands, fingers, palms, and soles of the feet. Calluses are usually larger than corns.  Corns and calluses are caused by   rubbing (friction) or pressure, such as from shoes that are too tight or do not fit properly.  Treatment may include wearing any padding, protective layers, gloves, or orthotics as told by your health care provider. This information is not intended to replace advice given to you by your health care provider. Make sure you discuss any questions you have with your health care provider. Document Released: 02/05/2004 Document Revised: 08/21/2018 Document Reviewed: 03/14/2017 Elsevier Patient Education  2020 Elsevier  Inc.   Onychomycosis/Fungal Toenails  WHAT IS IT? An infection that lies within the keratin of your nail plate that is caused by a fungus.  WHY ME? Fungal infections affect all ages, sexes, races, and creeds.  There may be many factors that predispose you to a fungal infection such as age, coexisting medical conditions such as diabetes, or an autoimmune disease; stress, medications, fatigue, genetics, etc.  Bottom line: fungus thrives in a warm, moist environment and your shoes offer such a location.  IS IT CONTAGIOUS? Theoretically, yes.  You do not want to share shoes, nail clippers or files with someone who has fungal toenails.  Walking around barefoot in the same room or sleeping in the same bed is unlikely to transfer the organism.  It is important to realize, however, that fungus can spread easily from one nail to the next on the same foot.  HOW DO WE TREAT THIS?  There are several ways to treat this condition.  Treatment may depend on many factors such as age, medications, pregnancy, liver and kidney conditions, etc.  It is best to ask your doctor which options are available to you.  1. No treatment.   Unlike many other medical concerns, you can live with this condition.  However for many people this can be a painful condition and may lead to ingrown toenails or a bacterial infection.  It is recommended that you keep the nails cut short to help reduce the amount of fungal nail. 2. Topical treatment.  These range from herbal remedies to prescription strength nail lacquers.  About 40-50% effective, topicals require twice daily application for approximately 9 to 12 months or until an entirely new nail has grown out.  The most effective topicals are medical grade medications available through physicians offices. 3. Oral antifungal medications.  With an 80-90% cure rate, the most common oral medication requires 3 to 4 months of therapy and stays in your system for a year as the new nail grows out.   Oral antifungal medications do require blood work to make sure it is a safe drug for you.  A liver function panel will be performed prior to starting the medication and after the first month of treatment.  It is important to have the blood work performed to avoid any harmful side effects.  In general, this medication safe but blood work is required. 4. Laser Therapy.  This treatment is performed by applying a specialized laser to the affected nail plate.  This therapy is noninvasive, fast, and non-painful.  It is not covered by insurance and is therefore, out of pocket.  The results have been very good with a 80-95% cure rate.  [facility] is the only practice in the area to offer this therapy. 5. Permanent Nail Avulsion.  Removing the entire nail so that a new nail will not grow back. 

## 2018-12-05 ENCOUNTER — Telehealth: Payer: Self-pay | Admitting: Pharmacist

## 2018-12-05 ENCOUNTER — Telehealth: Payer: Self-pay | Admitting: Family Medicine

## 2018-12-05 NOTE — Telephone Encounter (Signed)
Will forward to Dr. Koval. Dylan Jordan,CMA  

## 2018-12-05 NOTE — Telephone Encounter (Signed)
-----   Message from Leavy Cella, [facility] sent at 11/28/2018  3:45 PM EDT ----- Regarding: Tobacco and DM FU

## 2018-12-05 NOTE — Telephone Encounter (Signed)
Pt is calling and would like for Dr. Valentina Lucks to call him to discuss his medications and his sugar levels being high.

## 2018-12-05 NOTE — Telephone Encounter (Signed)
Phone call follow up for patient reporting "high blood sugars".   Patient states he has seen 200s or 300s.   He checked his blood sugar while we were on the phone and reported a reading of: 141 - states he has not eaten much since drinking sugar containing soda.    Patient reports eating candy and drinking regular soda which he is aware causes high blood sugars.   Patient reported he has quit smoking for ~ 3 weeks.   I encouraged him to continue with his progress.   He reports quitting for 3-4 months in the past.     Plan phone f/u in 2 weeks.  He is planning a visit with new PCP - Dr. Enid Derry.

## 2018-12-08 NOTE — Progress Notes (Signed)
Subjective: Dylan Jordan is a 63 y.o. y.o. male who presents today with cc of painful, discolored, thick toenails which interfere with daily activities. Pain is aggravated when wearing enclosed shoe gear and relieved with periodic professional debridement.  Patient states he would like to be seen every 5 weeks as this was what he was used to in [place].  Shirley, [place], DO is his PCP.    Current Outpatient Medications:  .  Accu-Chek Softclix Lancets lancets, 1 each by Other route 3 (three) times daily., Disp: 100 each, Rfl: 9 .  acetaminophen (TYLENOL) 500 MG tablet, Take 1,000 mg by mouth every 6 (six) hours as needed (for pain)., Disp: , Rfl:  .  albuterol (PROVENTIL HFA;VENTOLIN HFA) 108 (90 Base) MCG/ACT inhaler, Inhale 1-2 puffs into the lungs every 6 (six) hours as needed for wheezing or shortness of breath., Disp: 1 Inhaler, Rfl: 2 .  allopurinol (ZYLOPRIM) 100 MG tablet, Take 2 tablets (200 mg total) by mouth daily., Disp: 180 tablet, Rfl: 3 .  ammonium lactate (AMLACTIN) 12 % lotion, Apply 1 application topically as needed for dry skin., Disp: 400 g, Rfl: 0 .  atorvastatin (LIPITOR) 20 MG tablet, TAKE 1 TABLET BY MOUTH EVERY DAY, Disp: 30 tablet, Rfl: 4 .  BD PEN NEEDLE NANO U/F 32G X 4 MM MISC, USE TO INJECT insulin EVERY DAY, Disp: 100 each, Rfl: 2 .  benztropine (COGENTIN) 2 MG tablet, Take 2 mg by mouth 2 (two) times daily., Disp: , Rfl:  .  Blood Glucose Monitoring Suppl (ACCU-CHEK AVIVA PLUS) w/Device KIT, Use to check blood sugar three times daily or as directed., Disp: 1 kit, Rfl: 0 .  divalproex (DEPAKOTE) 500 MG DR tablet, Take 500-2,000 mg by mouth See admin instructions. Take 500 mg by mouth in the morning and 2,000 mg at bedtime, Disp: , Rfl:  .  ferrous sulfate (FERROUSUL) 325 (65 FE) MG tablet, Take 1 tablet (325 mg total) by mouth daily with breakfast., Disp: 90 tablet, Rfl: 3 .  finasteride (PROSCAR) 5 MG tablet, Take 1 tablet (5 mg total) by mouth daily., Disp: 30  tablet, Rfl: 11 .  fluticasone (FLOVENT HFA) 110 MCG/ACT inhaler, Inhale 2 puffs into the lungs 2 (two) times daily. (Patient taking differently: Inhale 2 puffs into the lungs daily as needed (for flares). ), Disp: 1 Inhaler, Rfl: 5 .  furosemide (LASIX) 20 MG tablet, Take 1 tablet (20 mg total) by mouth daily., Disp: 5 tablet, Rfl: 0 .  gabapentin (NEURONTIN) 300 MG capsule, TAKE 1 CAPSULE BY MOUTH EVERY MORNING AND 2 CAPSULES AT BEDTIME, Disp: 90 capsule, Rfl: 1 .  glimepiride (AMARYL) 4 MG tablet, Take 2 mg by mouth daily. , Disp: , Rfl:  .  glucose blood (ACCU-CHEK AVIVA PLUS) test strip, Dispense QS for twice daily testing., Disp: 100 each, Rfl: 12 .  haloperidol (HALDOL) 5 MG tablet, Take 5 mg by mouth at bedtime., Disp: , Rfl:  .  haloperidol decanoate (HALDOL DECANOATE) 100 MG/ML injection, Inject 200 mg into the muscle every 28 (twenty-eight) days. , Disp: , Rfl:  .  Insulin Glargine (LANTUS SOLOSTAR) 100 UNIT/ML Solostar Pen, Inject 25 Units into the skin daily., Disp: 3 pen, Rfl: 12 .  lithium carbonate 300 MG capsule, Take 300 mg by mouth 2 (two) times daily with a meal., Disp: , Rfl:  .  losartan (COZAAR) 25 MG tablet, Take 1 tablet (25 mg total) by mouth daily. (Patient taking differently: Take 25 mg by mouth at bedtime. ),  Disp: 90 tablet, Rfl: 2 .  Multiple Vitamin (DAILY-VITE) TABS, TAKE 1 TABLET BY MOUTH DAILY., Disp: 30 tablet, Rfl: 11 .  NOVOFINE PLUS 32G X 4 MM MISC, 1 Container by Does not apply route daily., Disp: , Rfl:  .  omeprazole (PRILOSEC) 20 MG capsule, TAKE 2 CAPSULES BY MOUTH EVERY EVENING, Disp: 60 capsule, Rfl: 2 .  oxybutynin (DITROPAN-XL) 10 MG 24 hr tablet, Take 10 mg by mouth daily., Disp: , Rfl:  .  Probiotic Product (CULTURELLE PROBIOTICS) CHEW, Chew 1 capsule by mouth 4 (four) times daily., Disp: 90 tablet, Rfl: 0 .  tamsulosin (FLOMAX) 0.4 MG CAPS capsule, Take 2 capsules (0.8 mg total) by mouth daily. (Patient taking differently: Take 0.4 mg by mouth at  bedtime. ), Disp: 30 capsule, Rfl: 0 .  VICTOZA 18 MG/3ML SOPN, INJECT 0.3ML (1.51m) INTO THE SKIN DAILY, Disp: 9 mL, Rfl: 3  Allergies  Allergen Reactions  . Chantix [Varenicline Tartrate] Other (See Comments)    Nightmares  . Ativan [Lorazepam] Other (See Comments)    Agitation (and "5 others like it") [facility] [place] [phone] [phone] fax a release-   . Shellfish-Derived Products Itching  . Penicillins Diarrhea    Has patient had a PCN reaction causing immediate rash, facial/tongue/throat swelling, SOB or lightheadedness with hypotension: No Has patient had a PCN reaction causing severe rash involving mucus membranes or skin necrosis: No Has patient had a PCN reaction that required hospitalization: No Has patient had a PCN reaction occurring within the last 10 years: No If all of the above answers are "NO", then may proceed with Cephalosporin use.     Objective: Vascular Examination: Capillary refill time immediate x 10 digits.  Dorsalis pedis pulses palpable b/l.  Posterior tibial pulses palpable b/l.  Digital hair sparse x 10 digits.  Skin temperature gradient WNL b/l.  Dermatological Examination: Skin with normal turgor, texture and tone b/l.  Toenails 1-5 b/l discolored, thick, dystrophic with subungual debris and pain with palpation to nailbeds due to thickness of nails.  Hyperkeratotic lesion dorsal PIPJ left foot. No erythema, no edema, no drainage, no flocculence noted.   Musculoskeletal: Muscle strength 5/5 to all LE muscle groups.  Hammertoe deformity 5th digit b/l.  Neurological: Sensation intact 5/5 b/l with 10 gram monofilament.  Vibratory sensation intact b/l.  Assessment: 1. Painful onychomycosis toenails 1-5 b/l 2.  Corn left 5th digit 3.  NIDDM  Plan: 1. Continue diabetic foot care principles. Literature dispensed on today. 2. Toenails 1-5 b/l were debrided in length and girth  without iatrogenic bleeding. 3. Hyperkeratotic lesion(s) pared left 5th digitwith sterile scalpel blade without incident. 4. Patient to continue soft, supportive shoe gear daily. 5. Patient to report any pedal injuries to medical professional immediately. 6. Mr. CGilreathwas counseled by our billing department on eligibility frequency based on his insurance and he now agrees to follow up every 9 weeks.  7. Patient/POA to call should there be a concern in the interim.

## 2018-12-09 ENCOUNTER — Encounter: Payer: Self-pay | Admitting: Family Medicine

## 2018-12-09 ENCOUNTER — Other Ambulatory Visit: Payer: Self-pay

## 2018-12-09 ENCOUNTER — Ambulatory Visit: Payer: Medicaid Other | Admitting: Family Medicine

## 2018-12-09 ENCOUNTER — Other Ambulatory Visit: Payer: Self-pay | Admitting: *Deleted

## 2018-12-09 VITALS — BP 110/70 | HR 86 | Ht 73.0 in | Wt 249.4 lb

## 2018-12-09 DIAGNOSIS — Z720 Tobacco use: Secondary | ICD-10-CM | POA: Diagnosis not present

## 2018-12-09 DIAGNOSIS — Z794 Long term (current) use of insulin: Secondary | ICD-10-CM | POA: Diagnosis not present

## 2018-12-09 DIAGNOSIS — I10 Essential (primary) hypertension: Secondary | ICD-10-CM

## 2018-12-09 DIAGNOSIS — F209 Schizophrenia, unspecified: Secondary | ICD-10-CM

## 2018-12-09 DIAGNOSIS — E1140 Type 2 diabetes mellitus with diabetic neuropathy, unspecified: Secondary | ICD-10-CM | POA: Diagnosis not present

## 2018-12-09 MED ORDER — LOSARTAN POTASSIUM 25 MG PO TABS: 25.0000 mg | ORAL_TABLET | Freq: Every day | ORAL | 2 refills | Status: DC

## 2018-12-09 MED ORDER — GABAPENTIN 300 MG PO CAPS: ORAL_CAPSULE | ORAL | 1 refills | Status: DC

## 2018-12-09 MED ORDER — VICTOZA 18 MG/3ML ~~LOC~~ SOPN: PEN_INJECTOR | SUBCUTANEOUS | 3 refills | Status: DC

## 2018-12-09 NOTE — Progress Notes (Signed)
Subjective:  Patient ID: Dylan Jordan  DOB: 01-22-1956 MRN: [id number]  Dylan Jordan is a 63 y.o. male with a PMH of schizophrenia, HTN, T2DM,asthma, CKD, gout, h/o polysubstance abuse, tobacco use disorder, here today to meet new PCP.   HPI:  T2DM: - patient endorses still drinking sodas daily, he does report compliance with his meds. States that his CBG's have been running between 200-300's at home. He is on ozempic 1.8mg  and lantus 25 u. He does not want to make any changes currently because he is focusing on his smoking and psych concerns. - on asa and statin, up to date on foot exam ROS: denies dizziness, diaphoresis, LOC, polyuria, polydipsia  Tobacco Use Disorder: Patient repots he has not smoked in 3 weeks, until his niece gave him half a cigarette. He reports he has been able to quit before and he is hopeful but it hard for him.  - not wanting any other help at this time  Schizophrenia: Patient has appointment with psych tomorrow. States he saw tumbleweeds and seahorses floating around his apartment this weekend after he missed his doses of Depakote, but states it improved after he restarted the medications, he is unsure why he missed it.  - denies active hallucinations, able to complete ADL's, no SI/HI  Monitoring Labs and Parameters Last A1C:  Lab Results  Component Value Date   HGBA1C 7.6 (A) 09/30/2018    ROS: as mentioned in HPI Social hx: Denies use of illicit drugs, alcohol use  Patient Active Problem List   Diagnosis Date Noted  . Anxiety 09/18/2018  . Swelling of both lower extremities 09/13/2018  . Schizophrenia ([place]) 07/06/2018  . Type 2 diabetes mellitus with diabetic chronic kidney disease ([place])   . Anemia   . Hyperlipidemia 12/18/2017  . 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 Intermittent chest pain 11/25/2017  . Allergic rhinitis with a nonallergic component 07/30/2017  . Food allergy 07/30/2017  . Chronic gout involving toe of  left foot without tophus 05/18/2017  . Hemorrhoids 05/04/2017  . Haloperidol adverse reaction 04/16/2017  . Chronic low back pain 01/03/2017  . Chronic cough 06/30/2016  . Drug-induced mood disorder ([place]) 06/14/2016  . Type 2 diabetes mellitus with diabetic neuropathy ([place]) 05/26/2016  . Iron deficiency anemia 05/26/2016  . Chronic kidney disease 05/26/2016  . HTN (hypertension) 05/26/2016  . Tobacco abuse 05/26/2016  . History of substance abuse ([place]) 05/26/2016  . History of alcohol abuse 05/26/2016  . Mild persistent asthma 05/26/2016     Objective:  BP 110/70   Pulse 86   Ht 6\' 1"  (1.854 m)   Wt 249 lb 6 oz (113.1 kg)   SpO2 96%   BMI 32.90 kg/m   Vitals and nursing note reviewed  General: NAD, pleasant Pulm: normal effort Extremities: no edema or cyanosis. WWP. Skin: warm and dry, no rashes noted Neuro: alert and oriented, no focal deficits Psych: Neatly groomed and appropriately dressed. Unable to maintain good eye contact but is cooperative and attentive. Speech is normal volume and rate, but quite tangential. Denies SI/ HI.   Assessment & Plan:   Type 2 diabetes mellitus with diabetic neuropathy (HCC) Obtain A1c at next visit. Continue current meds. Encouraged to stop going to get sodas.   Schizophrenia ([place]) Hallucinations may be 2/2 to missed depakote, but patient now compliant and no active hallucinations. Seeing psychiatrist tomorrow.   Tobacco abuse Reports only having 1/2 cigarette in last 3 weeks. Continues to attempt to quit. Encouraged  and congratulated on current progress.   Dylan Davaughn Hillyard, DO Family Medicine Resident PGY-3

## 2018-12-09 NOTE — Patient Instructions (Addendum)
Thank you for coming to see me today. It was a pleasure! Today we talked about:   Please follow up with [facility] about any psych concerns. You may call and schedule COVID test with [facility] [phone]).  Please continue to stop smoking cigarettes!  Please follow-up with me in 4 weeks or sooner as needed.  If you have any questions or concerns, please do not hesitate to call the office at [phone].  Take Care,   [place] Ferdinando Lodge, DO

## 2018-12-10 ENCOUNTER — Telehealth: Payer: Self-pay | Admitting: *Deleted

## 2018-12-10 NOTE — Telephone Encounter (Signed)
Pt has lost the top to his lancing device, he will need a new one called in.  To PCP. [name], CMA

## 2018-12-11 MED ORDER — ACCU-CHEK SOFTCLIX LANCET DEV KIT: 1.0000 | PACK | Freq: Once | 1 refills | Status: AC

## 2018-12-11 NOTE — Telephone Encounter (Signed)
Sent in a new lancing device.

## 2018-12-12 ENCOUNTER — Encounter: Payer: Self-pay | Admitting: Family Medicine

## 2018-12-12 NOTE — Assessment & Plan Note (Signed)
Obtain A1c at next visit. Continue current meds. Encouraged to stop going to get sodas.

## 2018-12-12 NOTE — Assessment & Plan Note (Signed)
Hallucinations may be 2/2 to missed depakote, but patient now compliant and no active hallucinations. Seeing psychiatrist tomorrow.

## 2018-12-12 NOTE — Assessment & Plan Note (Signed)
Reports only having 1/2 cigarette in last 3 weeks. Continues to attempt to quit. Encouraged and congratulated on current progress.

## 2018-12-12 NOTE — Addendum Note (Signed)
Addended by: Owens Shark, CARINA on: 12/12/2018 08:59 PM   Modules accepted: Level of Service

## 2018-12-18 ENCOUNTER — Telehealth: Payer: Self-pay | Admitting: Pharmacist

## 2018-12-18 NOTE — Telephone Encounter (Signed)
Left message that I will try to follow-up RE Tobacco Cessation and DM tomorrow.

## 2018-12-18 NOTE — Telephone Encounter (Signed)
-----   Message from Leavy Cella, [facility] sent at 12/05/2018  6:10 PM EDT ----- Regarding: DM and tobacco cessation

## 2018-12-19 NOTE — Telephone Encounter (Signed)
-----   Message from Peter G Koval, RPH sent at 12/05/2018  6:10 PM EDT ----- Regarding: DM and tobacco cessation   

## 2018-12-19 NOTE — Telephone Encounter (Signed)
Phone call follow-up RE Diabetes and tobacco cessation  Diabetes: reports elevated blood sugars of low 200s, a few 100s, also reports a few readings 400-500.  Continues to drink [employer] frequently.   Advised to change diet (more water and only diet soda).  No change in drug regimen.   Denies smoking for > 1 month.  Encouraged continued abstinence.

## 2018-12-23 ENCOUNTER — Emergency Department (HOSPITAL_COMMUNITY)
Admission: EM | Admit: 2018-12-23 | Discharge: 2018-12-23 | Disposition: A | Discharge status: Home / Self Care | Payer: Medicaid Other | Attending: Emergency Medicine | Admitting: Emergency Medicine

## 2018-12-23 ENCOUNTER — Other Ambulatory Visit: Payer: Self-pay

## 2018-12-23 ENCOUNTER — Emergency Department (HOSPITAL_COMMUNITY): Payer: Medicaid Other

## 2018-12-23 ENCOUNTER — Telehealth: Payer: Self-pay | Admitting: *Deleted

## 2018-12-23 ENCOUNTER — Encounter (HOSPITAL_COMMUNITY): Payer: Self-pay | Admitting: Emergency Medicine

## 2018-12-23 DIAGNOSIS — I129 Hypertensive chronic kidney disease with stage 1 through stage 4 chronic kidney disease, or unspecified chronic kidney disease: Secondary | ICD-10-CM | POA: Insufficient documentation

## 2018-12-23 DIAGNOSIS — R4781 Slurred speech: Secondary | ICD-10-CM | POA: Diagnosis not present

## 2018-12-23 DIAGNOSIS — Z96652 Presence of left artificial knee joint: Secondary | ICD-10-CM | POA: Insufficient documentation

## 2018-12-23 DIAGNOSIS — N189 Chronic kidney disease, unspecified: Secondary | ICD-10-CM | POA: Diagnosis not present

## 2018-12-23 DIAGNOSIS — R4182 Altered mental status, unspecified: Secondary | ICD-10-CM | POA: Diagnosis present

## 2018-12-23 DIAGNOSIS — Z794 Long term (current) use of insulin: Secondary | ICD-10-CM | POA: Insufficient documentation

## 2018-12-23 DIAGNOSIS — R410 Disorientation, unspecified: Secondary | ICD-10-CM | POA: Insufficient documentation

## 2018-12-23 DIAGNOSIS — F17210 Nicotine dependence, cigarettes, uncomplicated: Secondary | ICD-10-CM | POA: Diagnosis not present

## 2018-12-23 DIAGNOSIS — E1140 Type 2 diabetes mellitus with diabetic neuropathy, unspecified: Secondary | ICD-10-CM | POA: Diagnosis not present

## 2018-12-23 DIAGNOSIS — R5383 Other fatigue: Secondary | ICD-10-CM

## 2018-12-23 DIAGNOSIS — Z79899 Other long term (current) drug therapy: Secondary | ICD-10-CM | POA: Insufficient documentation

## 2018-12-23 DIAGNOSIS — J45909 Unspecified asthma, uncomplicated: Secondary | ICD-10-CM | POA: Diagnosis not present

## 2018-12-23 DIAGNOSIS — E1122 Type 2 diabetes mellitus with diabetic chronic kidney disease: Secondary | ICD-10-CM | POA: Insufficient documentation

## 2018-12-23 LAB — CBC WITH DIFFERENTIAL/PLATELET
Abs Immature Granulocytes: 0.03 10*3/uL (ref 0.00–0.07)
Basophils Absolute: 0 10*3/uL (ref 0.0–0.1)
Basophils Relative: 0 %
Eosinophils Absolute: 0.2 10*3/uL (ref 0.0–0.5)
Eosinophils Relative: 3 %
HCT: 33.7 % — ABNORMAL LOW (ref 39.0–52.0)
Hemoglobin: 10.4 g/dL — ABNORMAL LOW (ref 13.0–17.0)
Immature Granulocytes: 1 %
Lymphocytes Relative: 23 %
Lymphs Abs: 1.5 10*3/uL (ref 0.7–4.0)
MCH: 29 pg (ref 26.0–34.0)
MCHC: 30.9 g/dL (ref 30.0–36.0)
MCV: 93.9 fL (ref 80.0–100.0)
Monocytes Absolute: 0.6 10*3/uL (ref 0.1–1.0)
Monocytes Relative: 9 %
Neutro Abs: 4.3 10*3/uL (ref 1.7–7.7)
Neutrophils Relative %: 64 %
Platelets: 218 10*3/uL (ref 150–400)
RBC: 3.59 MIL/uL — ABNORMAL LOW (ref 4.22–5.81)
RDW: 13.9 % (ref 11.5–15.5)
WBC: 6.6 10*3/uL (ref 4.0–10.5)
nRBC: 0 % (ref 0.0–0.2)

## 2018-12-23 LAB — URINALYSIS, ROUTINE W REFLEX MICROSCOPIC
Bacteria, UA: NONE SEEN
Bilirubin Urine: NEGATIVE
Glucose, UA: >=500 mg/dL — AB
Hgb urine dipstick: NEGATIVE
Ketones, ur: NEGATIVE mg/dL
Leukocytes,Ua: NEGATIVE
Nitrite: NEGATIVE
Protein, ur: 100 mg/dL — AB
Specific Gravity, Urine: 1.009 (ref 1.005–1.030)
pH: 6 (ref 5.0–8.0)

## 2018-12-23 LAB — ETHANOL: Alcohol, Ethyl (B): <10 mg/dL (ref <10)

## 2018-12-23 LAB — CBG MONITORING, ED
Glucose-Capillary: 414 mg/dL — ABNORMAL HIGH (ref 70–99)
Glucose-Capillary: 258 mg/dL — ABNORMAL HIGH (ref 70–99)

## 2018-12-23 LAB — RAPID URINE DRUG SCREEN, HOSP PERFORMED
Amphetamines: NOT DETECTED
Barbiturates: NOT DETECTED
Benzodiazepines: NOT DETECTED
Cocaine: NOT DETECTED
Opiates: NOT DETECTED
Tetrahydrocannabinol: NOT DETECTED

## 2018-12-23 LAB — COMPREHENSIVE METABOLIC PANEL
ALT: 20 U/L (ref 0–44)
AST: 16 U/L (ref 15–41)
Albumin: 3 g/dL — ABNORMAL LOW (ref 3.5–5.0)
Alkaline Phosphatase: 66 U/L (ref 38–126)
Anion gap: 9 (ref 5–15)
BUN: 15 mg/dL (ref 8–23)
CO2: 21 mmol/L — ABNORMAL LOW (ref 22–32)
Calcium: 9.4 mg/dL (ref 8.9–10.3)
Chloride: 102 mmol/L (ref 98–111)
Creatinine, Ser: 1.64 mg/dL — ABNORMAL HIGH (ref 0.61–1.24)
GFR calc Af Amer: 51 mL/min — ABNORMAL LOW (ref >60)
GFR calc non Af Amer: 44 mL/min — ABNORMAL LOW (ref >60)
Glucose, Bld: 433 mg/dL — ABNORMAL HIGH (ref 70–99)
Potassium: 4.8 mmol/L (ref 3.5–5.1)
Sodium: 132 mmol/L — ABNORMAL LOW (ref 135–145)
Total Bilirubin: 0.4 mg/dL (ref 0.3–1.2)
Total Protein: 6.5 g/dL (ref 6.5–8.1)

## 2018-12-23 LAB — TROPONIN I (HIGH SENSITIVITY)
Troponin I (High Sensitivity): 6 ng/L (ref <18)
Troponin I (High Sensitivity): 5 ng/L (ref <18)

## 2018-12-23 LAB — AMMONIA: Ammonia: 34 umol/L (ref 9–35)

## 2018-12-23 MED ORDER — SODIUM CHLORIDE 0.9 % IV BOLUS
1000.0000 mL | Freq: Once | INTRAVENOUS | Status: AC
  Administered 2018-12-23: 16:00:00 1000 mL via INTRAVENOUS

## 2018-12-23 NOTE — Telephone Encounter (Signed)
Niece called and wanted to let PCP know that they have recently switched him from haldol and he is not doing well.  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  Advised to reach out to [facility].  Niece agreeable. [name], CMA

## 2018-12-23 NOTE — ED Notes (Signed)
Patient in a gown and monitor

## 2018-12-23 NOTE — ED Provider Notes (Signed)
Patient will easily arouse and discuss things with me.  He was able to eat.  The patient will be discharged home.  The patient received the Haldol and I feel that contributed to this issue.  The patient is not showing any signs of any other alteration.  The patient is alert and oriented on my discussion.   Dalia Heading, PA-C 12/23/18 2124    Dorie Rank, MD 12/25/18 [phone]

## 2018-12-23 NOTE — ED Notes (Signed)
Upon this RN entering the room, patient requested to go to the restroom. This RN inquired if patient would like to walk to the restroom or use a urinal, patient became agitated and stated "I want to walk to the bathroom." patient then asked "is this a psychiatric hospital?" this Rn explained that the patient is in the emergency room and asked the patient if he knew why he was here, pt, still agitated stated, "I dont want to talk about it!"  patient then stated, "I want a black nurse, Its too difficult to communicate with you."

## 2018-12-23 NOTE — Discharge Instructions (Addendum)
Return here as needed.  Follow-up with your psychiatrist.  Follow-up with your primary doctor.

## 2018-12-23 NOTE — ED Provider Notes (Signed)
[facility]ERGENCY DEPARTMENT Provider Note   CSN: [id number] Arrival date & time: 12/23/18  1345    History   Chief Complaint Chief Complaint  Patient presents with  . Altered Mental Status    HPI Dylan Jordan is a 63 y.o. male.     HPI   Level 5 caveat due to altered mental status.  Dylan Jordan is a 63 y.o. male, with a history of asthma, anemia, DM, hyperlipidemia, HTN, schizophrenia, presenting to the ED with altered mental status beginning shortly prior to arrival.  Patient reportedly received his monthly Haldol injection around 11:50 AM.  Patient then began wandering around the facility confused and with slurred speech.  EMS reported that upon their arrival patient was alert and oriented, but then, "slept most of the way."       Past Medical History:  Diagnosis Date  . Anemia   . Asthma   . Diabetes mellitus without complication ([place])   . Gout   . Hyperlipidemia   . Hyperosmolar hyperglycemic coma due to diabetes mellitus without ketoacidosis ([place])   . Hypertension   . Schizophrenia ([place]) 07/06/2018    Patient Active Problem List   Diagnosis Date Noted  . Anxiety 09/18/2018  . Swelling of both lower extremities 09/13/2018  . Schizophrenia ([place]) 07/06/2018  . Type 2 diabetes mellitus with diabetic chronic kidney disease ([place])   . Anemia   . Hyperlipidemia 12/18/2017  . 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 Intermittent chest pain 11/25/2017  . Allergic rhinitis with a nonallergic component 07/30/2017  . Food allergy 07/30/2017  . Chronic gout involving toe of left foot without tophus 05/18/2017  . Hemorrhoids 05/04/2017  . Haloperidol adverse reaction 04/16/2017  . Chronic low back pain 01/03/2017  . Chronic cough 06/30/2016  . Drug-induced mood disorder ([place]) 06/14/2016  . Type 2 diabetes mellitus with diabetic neuropathy ([place]) 05/26/2016  . Iron deficiency anemia 05/26/2016  . Chronic kidney disease  05/26/2016  . HTN (hypertension) 05/26/2016  . Tobacco abuse 05/26/2016  . History of substance abuse ([place]) 05/26/2016  . History of alcohol abuse 05/26/2016  . Mild persistent asthma 05/26/2016    Past Surgical History:  Procedure Laterality Date  . REPLACEMENT TOTAL KNEE Left   . TOE SURGERY          Home Medications    Prior to Admission medications   Medication Sig Start Date End Date Taking? Authorizing Provider  Accu-Chek Softclix Lancets lancets 1 each by Other route 3 (three) times daily. 09/23/18   [name], DO  acetaminophen (TYLENOL) 500 MG tablet Take 1,000 mg by mouth every 6 (six) hours as needed (for pain).    [provider]  albuterol (PROVENTIL HFA;VENTOLIN HFA) 108 (90 Base) MCG/ACT inhaler Inhale 1-2 puffs into the lungs every 6 (six) hours as needed for wheezing or shortness of breath. 03/25/18   Bobbitt, [place] Muta, MD  allopurinol (ZYLOPRIM) 100 MG tablet Take 2 tablets (200 mg total) by mouth daily. 01/10/18   Zenia Resides, MD  ammonium lactate (AMLACTIN) 12 % lotion Apply 1 application topically as needed for dry skin. 08/09/18   [name], DO  atorvastatin (LIPITOR) 20 MG tablet TAKE 1 TABLET BY MOUTH EVERY DAY 09/09/18   [name], Levada Dy C, DO  BD PEN NEEDLE NANO U/F 32G X 4 MM MISC USE TO INJECT insulin EVERY DAY Patient taking differently: daily.  09/25/18   [name], DO  benztropine (COGENTIN) 2  MG tablet Take 2 mg by mouth 2 (two) times daily.    [provider]  Blood Glucose Monitoring Suppl (ACCU-CHEK AVIVA PLUS) w/Device KIT Use to check blood sugar three times daily or as directed. 09/23/18   [name], DO  divalproex (DEPAKOTE) 500 MG DR tablet Take 500-2,000 mg by mouth See admin instructions. Take 500 mg by mouth in the morning and 2,000 mg at bedtime    [provider]  ferrous sulfate (FERROUSUL) 325 (65 FE) MG tablet Take 1 tablet (325 mg total) by mouth daily with breakfast. 03/06/18    [name], DO  finasteride (PROSCAR) 5 MG tablet Take 1 tablet (5 mg total) by mouth daily. 08/08/18   [name], DO  fluticasone (FLOVENT HFA) 110 MCG/ACT inhaler Inhale 2 puffs into the lungs 2 (two) times daily. Patient taking differently: Inhale 2 puffs into the lungs daily as needed (for flares).  07/30/17   Bobbitt, [place] Muta, MD  furosemide (LASIX) 20 MG tablet Take 1 tablet (20 mg total) by mouth daily. 09/18/18   [name], DO  gabapentin (NEURONTIN) 300 MG capsule TAKE 1 CAPSULE BY MOUTH EVERY MORNING AND 2 CAPSULES AT BEDTIME 12/09/18   Enid Derry, [place], DO  glimepiride (AMARYL) 4 MG tablet Take 2 mg by mouth daily.  02/17/18   [provider]  glucose blood (ACCU-CHEK AVIVA PLUS) test strip Dispense QS for twice daily testing. 09/23/18   [name], DO  haloperidol (HALDOL) 5 MG tablet Take 5 mg by mouth at bedtime.    [provider]  haloperidol decanoate (HALDOL DECANOATE) 100 MG/ML injection Inject 200 mg into the muscle every 28 (twenty-eight) days.     [provider]  Insulin Glargine (LANTUS SOLOSTAR) 100 UNIT/ML Solostar Pen Inject 25 Units into the skin daily. 08/14/18   Zenia Resides, MD  liraglutide (VICTOZA) 18 MG/3ML SOPN INJECT 0.3ML (1.77m) INTO THE SKIN DAILY 12/09/18   Shirley, JMartinique DO  lithium carbonate 300 MG capsule Take 300 mg by mouth 2 (two) times daily with a meal.    [provider]  losartan (COZAAR) 25 MG tablet Take 1 tablet (25 mg total) by mouth at bedtime. 12/09/18   Shirley, JMartinique DO  Multiple Vitamin (DAILY-VITE) TABS TAKE 1 TABLET BY MOUTH DAILY. 10/03/16   TGlenis Smoker MD  NOVOFINE PLUS 32G X 4 MM MISC 1 Container by Does not apply route daily. 05/18/18   [provider]  omeprazole (PRILOSEC) 20 MG capsule TAKE 2 CAPSULES BY MOUTH EVERY EVENING 11/07/18   RLucila MaineC, DO  oxybutynin (DITROPAN-XL) 10 MG 24 hr tablet Take 10 mg by mouth daily. 09/05/18   [provider]  Probiotic Product (CULTURELLE PROBIOTICS) CHEW Chew 1 capsule by mouth 4 (four) times daily. 07/13/18   Palumbo, April, MD  tamsulosin (FLOMAX) 0.4 MG CAPS capsule Take 2 capsules (0.8 mg total) by mouth daily. Patient taking differently: Take 0.4 mg by mouth at bedtime.  07/11/18   KWilber Oliphant MD    Family History Family History  Adopted: Yes  Problem Relation Age of Onset  . Allergic rhinitis Neg Hx   . Angioedema Neg Hx   . Asthma Neg Hx   . Atopy Neg Hx   . Eczema Neg Hx   . Immunodeficiency Neg Hx   . Urticaria Neg Hx     Social History Social History   Tobacco Use  . Smoking status: Current Some Day Smoker  Packs/day: 0.30    Years: 45.00    Pack years: 13.50    Types: Cigarettes    Start date: 05/16/1971  . Smokeless tobacco: Never Used  . Tobacco comment: Decreased Intake to 10 cigarettes per week or less.  Substance Use Topics  . Alcohol use: No  . Drug use: No     Allergies   Chantix [varenicline tartrate], Ativan [lorazepam], Shellfish-derived products, and Penicillins   Review of Systems Review of Systems  Unable to perform ROS: Mental status change     Physical Exam Updated Vital Signs BP 125/80 (BP Location: Right Arm)   Pulse 74   Temp 98.4 F (36.9 C) (Oral)   Resp 18   SpO2 97%   Physical Exam Vitals signs and nursing note reviewed.  Constitutional:      General: He is not in acute distress.    Appearance: He is well-developed. He is not diaphoretic.  HENT:     Head: Normocephalic and atraumatic.     Mouth/Throat:     Mouth: Mucous membranes are moist.     Pharynx: Oropharynx is clear.     Comments: Airway without any noted obstruction. Eyes:     Conjunctiva/sclera: Conjunctivae normal.     Comments: Pupils approximately 3 mm and equal bilaterally.  Neck:     Musculoskeletal: Neck supple.  Cardiovascular:     Rate and Rhythm: Normal rate and regular rhythm.     Pulses: Normal pulses.          Radial pulses  are 2+ on the right side and 2+ on the left side.       Posterior tibial pulses are 2+ on the right side and 2+ on the left side.     Heart sounds: Normal heart sounds.     Comments: Tactile temperature in the extremities appropriate and equal bilaterally. Pulmonary:     Effort: Pulmonary effort is normal. No respiratory distress.     Breath sounds: Normal breath sounds.     Comments: Normal breathing pattern.  No indication of distress. Abdominal:     Palpations: Abdomen is soft.     Tenderness: There is no abdominal tenderness. There is no guarding.  Musculoskeletal:     Right lower leg: No edema.     Left lower leg: No edema.     Comments: Overall trauma exam performed without any abnormalities noted other than those mentioned.  Lymphadenopathy:     Cervical: No cervical adenopathy.  Skin:    General: Skin is warm and dry.  Neurological:     Comments: During my initial contact with the patient, he had no response to even painful stimuli.  However, I then witnessed an episode where he spontaneously open his eyes, appeared to be startled and possibly scared at his surroundings, but was able to be calmed.  He had some slurred speech and mumbled what sounded like, "what is going on?"  He then closed his eyes and would not respond again.      ED Treatments / Results  Labs (all labs ordered are listed, but only abnormal results are displayed) Labs Reviewed  CBC WITH DIFFERENTIAL/PLATELET - Abnormal; Notable for the following components:      Result Value   RBC 3.59 (*)    Hemoglobin 10.4 (*)    HCT 33.7 (*)    All other components within normal limits  COMPREHENSIVE METABOLIC PANEL - Abnormal; Notable for the following components:   Sodium 132 (*)    CO2  21 (*)    Glucose, Bld 433 (*)    Creatinine, Ser 1.64 (*)    Albumin 3.0 (*)    GFR calc non Af Amer 44 (*)    GFR calc Af Amer 51 (*)    All other components within normal limits  URINALYSIS, ROUTINE W REFLEX MICROSCOPIC -  Abnormal; Notable for the following components:   Color, Urine STRAW (*)    Glucose, UA >=500 (*)    Protein, ur 100 (*)    All other components within normal limits  CBG MONITORING, ED - Abnormal; Notable for the following components:   Glucose-Capillary 414 (*)    All other components within normal limits  AMMONIA  RAPID URINE DRUG SCREEN, HOSP PERFORMED  ETHANOL  CBG MONITORING, ED  TROPONIN I (HIGH SENSITIVITY)  TROPONIN I (HIGH SENSITIVITY)   BUN  Date Value Ref Range Status  12/23/2018 15 8 - 23 mg/dL Final  09/18/2018 13 8 - 27 mg/dL Final  07/15/2018 25 8 - 27 mg/dL Final  07/12/2018 21 8 - 23 mg/dL Final  07/08/2018 12 8 - 23 mg/dL Final  07/07/2018 11 8 - 23 mg/dL Final  03/18/2018 12 8 - 27 mg/dL Final  01/03/2018 13 8 - 27 mg/dL Final   Creat  Date Value Ref Range Status  05/26/2016 1.22 0.70 - 1.33 mg/dL Final    Comment:      For patients > or = 62 years of age: The upper reference limit for Creatinine is approximately 13% higher for people identified as African-American.      Creatinine, Ser  Date Value Ref Range Status  12/23/2018 1.64 (H) 0.61 - 1.24 mg/dL Final  09/18/2018 1.28 (H) 0.76 - 1.27 mg/dL Final  07/15/2018 1.50 (H) 0.76 - 1.27 mg/dL Final  07/12/2018 1.82 (H) 0.61 - 1.24 mg/dL Final   Hemoglobin  Date Value Ref Range Status  12/23/2018 10.4 (L) 13.0 - 17.0 g/dL Final  07/12/2018 10.3 (L) 13.0 - 17.0 g/dL Final  07/08/2018 10.5 (L) 13.0 - 17.0 g/dL Final  07/06/2018 10.6 (L) 13.0 - 17.0 g/dL Final    EKG EKG Interpretation  Date/Time:  Monday December 23 2018 13:55:53 EDT Ventricular Rate:  74 PR Interval:    QRS Duration: 88 QT Interval:  406 QTC Calculation: 451 R Axis:   -12 Text Interpretation:  Sinus rhythm Low voltage, precordial leads no significant change since Feb 2020 Confirmed by Sherwood Gambler [phone]) on 12/23/2018 2:31:41 PM   Radiology Ct Head Wo Contrast  Result Date: 12/23/2018 CLINICAL DATA:  Altered level  of consciousness EXAM: CT HEAD WITHOUT CONTRAST TECHNIQUE: Contiguous axial images were obtained from the base of the skull through the vertex without intravenous contrast. COMPARISON:  July 04, 2018. FINDINGS: Brain: No evidence of acute territorial infarction, hemorrhage, hydrocephalus,extra-axial collection or mass lesion/mass effect. There is mild dilatation the ventricles and sulci consistent with age-related atrophy. Low-attenuation changes in the deep white matter consistent with small vessel ischemia. Vascular: No hyperdense vessel or unexpected calcification. Skull: The skull is intact. No fracture or focal lesion identified. Sinuses/Orbits: The visualized paranasal sinuses and mastoid air cells are clear. The orbits and globes intact. Other: None IMPRESSION: 1. No acute intracranial abnormality. 2. Findings consistent with mild age related atrophy and chronic small vessel ischemia Electronically Signed   By: Prudencio Pair M.D.   On: 12/23/2018 15:47   Dg Chest Portable 1 View  Result Date: 12/23/2018 CLINICAL DATA:  Altered mental status. EXAM: PORTABLE CHEST 1 VIEW  COMPARISON:  Radiographs of July 13, 2018. FINDINGS: Stable cardiomegaly. No pneumothorax or pleural effusion is noted. Both lungs are clear. The visualized skeletal structures are unremarkable. IMPRESSION: No active disease. Electronically Signed   By: Marijo Conception M.D.   On: 12/23/2018 14:44    Procedures Procedures (including critical care time)  Medications Ordered in ED Medications  sodium chloride 0.9 % bolus 1,000 mL (1,000 mLs Intravenous New Bag/Given 12/23/18 1626)     Initial Impression / Assessment and Plan / ED Course  I have reviewed the triage vital signs and the nursing notes.  Pertinent labs & imaging results that were available during my care of the patient were reviewed by me and considered in my medical decision making (see chart for details).        Patient presents with altered mental  status.  He presented with decreased responsiveness when he arrived, however, he did not have any signs of airway, breathing, or circulatory compromise.  He had intermittent episodes of wakefulness, but appeared to be groggy during these.  Cause for patient's presentation may be his Haldol injection. Hyperglycemic without elevated anion gap.  Low suspicion for DKA or HHS.  Lab work and imaging studies otherwise reassuring.  Findings and plan of care discussed with Sherwood Gambler, MD. Dr. Regenia Skeeter personally evaluated and examined this patient.    End of shift patient care handoff report given to Irena Cords, PA-C. Plan: follow up on any pending labs. Reassess patient for improvement.  Vitals:   12/23/18 1430 12/23/18 1545 12/23/18 1600 12/23/18 1615  BP: 138/82 134/89 (!) 145/91 133/84  Pulse: 70 69 68 71  Resp: '17 16 17 16  ' Temp:      TempSrc:      SpO2: 97% 97% 97% 97%  Weight:      Height:         Final Clinical Impressions(s) / ED Diagnoses   Final diagnoses:  None    ED Discharge Orders    None       Layla Maw 12/23/18 1649    Sherwood Gambler, MD 12/24/18 [phone]

## 2018-12-23 NOTE — ED Notes (Signed)
Meal and drink given  

## 2018-12-23 NOTE — ED Notes (Signed)
Pt requesting to have his blood sugar checked after using the restroom, upon this RN entering the room with supplies to check patient's blood sugar, he stated "you're trying to play games, I said I wanted a black nurse!" Charge RN made aware that patient is requesting to have a different nurse at this time.

## 2018-12-23 NOTE — ED Triage Notes (Signed)
Per [facility] pt coming from [facility]. Was seen there for usually haldol injection around 11:50. Patient walked around to other side of [name] when staff states patient began having some alerted mental status and slurred speech. EMS reports normal neuro exam and patient being alert and orientated x 4. Patient snoring and difficult to arouse on arrival.

## 2018-12-24 ENCOUNTER — Emergency Department (HOSPITAL_COMMUNITY)
Admission: EM | Admit: 2018-12-24 | Discharge: 2018-12-25 | Disposition: A | Discharge status: Home / Self Care | Payer: Medicaid Other | Attending: Emergency Medicine | Admitting: Emergency Medicine

## 2018-12-24 ENCOUNTER — Emergency Department (HOSPITAL_COMMUNITY): Payer: Medicaid Other

## 2018-12-24 DIAGNOSIS — R443 Hallucinations, unspecified: Secondary | ICD-10-CM | POA: Insufficient documentation

## 2018-12-24 DIAGNOSIS — I959 Hypotension, unspecified: Secondary | ICD-10-CM | POA: Insufficient documentation

## 2018-12-24 DIAGNOSIS — Z79899 Other long term (current) drug therapy: Secondary | ICD-10-CM | POA: Insufficient documentation

## 2018-12-24 DIAGNOSIS — E119 Type 2 diabetes mellitus without complications: Secondary | ICD-10-CM | POA: Diagnosis not present

## 2018-12-24 DIAGNOSIS — Z20828 Contact with and (suspected) exposure to other viral communicable diseases: Secondary | ICD-10-CM | POA: Insufficient documentation

## 2018-12-24 DIAGNOSIS — Z794 Long term (current) use of insulin: Secondary | ICD-10-CM | POA: Diagnosis not present

## 2018-12-24 DIAGNOSIS — Z046 Encounter for general psychiatric examination, requested by authority: Secondary | ICD-10-CM

## 2018-12-24 DIAGNOSIS — F209 Schizophrenia, unspecified: Secondary | ICD-10-CM | POA: Insufficient documentation

## 2018-12-24 DIAGNOSIS — N182 Chronic kidney disease, stage 2 (mild): Secondary | ICD-10-CM | POA: Insufficient documentation

## 2018-12-24 DIAGNOSIS — R4689 Other symptoms and signs involving appearance and behavior: Secondary | ICD-10-CM | POA: Diagnosis not present

## 2018-12-24 DIAGNOSIS — E1122 Type 2 diabetes mellitus with diabetic chronic kidney disease: Secondary | ICD-10-CM | POA: Diagnosis not present

## 2018-12-24 LAB — LACTIC ACID, PLASMA
Lactic Acid, Venous: 2.1 mmol/L (ref 0.5–1.9)
Lactic Acid, Venous: 2.7 mmol/L (ref 0.5–1.9)
Lactic Acid, Venous: 1.8 mmol/L (ref 0.5–1.9)

## 2018-12-24 LAB — SALICYLATE LEVEL: Salicylate Lvl: <7 mg/dL (ref 2.8–30.0)

## 2018-12-24 LAB — COMPREHENSIVE METABOLIC PANEL
ALT: 19 U/L (ref 0–44)
AST: 16 U/L (ref 15–41)
Albumin: 3 g/dL — ABNORMAL LOW (ref 3.5–5.0)
Alkaline Phosphatase: 64 U/L (ref 38–126)
Anion gap: 9 (ref 5–15)
BUN: 19 mg/dL (ref 8–23)
CO2: 20 mmol/L — ABNORMAL LOW (ref 22–32)
Calcium: 9.5 mg/dL (ref 8.9–10.3)
Chloride: 107 mmol/L (ref 98–111)
Creatinine, Ser: 1.98 mg/dL — ABNORMAL HIGH (ref 0.61–1.24)
GFR calc Af Amer: 41 mL/min — ABNORMAL LOW (ref >60)
GFR calc non Af Amer: 35 mL/min — ABNORMAL LOW (ref >60)
Glucose, Bld: 345 mg/dL — ABNORMAL HIGH (ref 70–99)
Potassium: 4.2 mmol/L (ref 3.5–5.1)
Sodium: 136 mmol/L (ref 135–145)
Total Bilirubin: 0.4 mg/dL (ref 0.3–1.2)
Total Protein: 6.5 g/dL (ref 6.5–8.1)

## 2018-12-24 LAB — URINALYSIS, ROUTINE W REFLEX MICROSCOPIC
Bacteria, UA: NONE SEEN
Bilirubin Urine: NEGATIVE
Glucose, UA: >=500 mg/dL — AB
Hgb urine dipstick: NEGATIVE
Ketones, ur: NEGATIVE mg/dL
Leukocytes,Ua: NEGATIVE
Nitrite: NEGATIVE
Protein, ur: 100 mg/dL — AB
Specific Gravity, Urine: 1.006 (ref 1.005–1.030)
pH: 6 (ref 5.0–8.0)

## 2018-12-24 LAB — CBG MONITORING, ED
Glucose-Capillary: 311 mg/dL — ABNORMAL HIGH (ref 70–99)
Glucose-Capillary: 167 mg/dL — ABNORMAL HIGH (ref 70–99)
Glucose-Capillary: 271 mg/dL — ABNORMAL HIGH (ref 70–99)

## 2018-12-24 LAB — CBC
HCT: 33.9 % — ABNORMAL LOW (ref 39.0–52.0)
Hemoglobin: 10.3 g/dL — ABNORMAL LOW (ref 13.0–17.0)
MCH: 28.7 pg (ref 26.0–34.0)
MCHC: 30.4 g/dL (ref 30.0–36.0)
MCV: 94.4 fL (ref 80.0–100.0)
Platelets: 220 10*3/uL (ref 150–400)
RBC: 3.59 MIL/uL — ABNORMAL LOW (ref 4.22–5.81)
RDW: 14.1 % (ref 11.5–15.5)
WBC: 8 10*3/uL (ref 4.0–10.5)
nRBC: 0 % (ref 0.0–0.2)

## 2018-12-24 LAB — RAPID URINE DRUG SCREEN, HOSP PERFORMED
Amphetamines: NOT DETECTED
Barbiturates: NOT DETECTED
Benzodiazepines: NOT DETECTED
Cocaine: NOT DETECTED
Opiates: NOT DETECTED
Tetrahydrocannabinol: NOT DETECTED

## 2018-12-24 LAB — ACETAMINOPHEN LEVEL: Acetaminophen (Tylenol), Serum: <10 ug/mL — ABNORMAL LOW (ref 10–30)

## 2018-12-24 LAB — ETHANOL: Alcohol, Ethyl (B): <10 mg/dL (ref <10)

## 2018-12-24 MED ORDER — SODIUM CHLORIDE 0.9 % IV BOLUS
1000.0000 mL | Freq: Once | INTRAVENOUS | Status: AC
  Administered 2018-12-24: 16:00:00 1000 mL via INTRAVENOUS

## 2018-12-24 MED ORDER — SODIUM CHLORIDE 0.9 % IV BOLUS
1000.0000 mL | Freq: Once | INTRAVENOUS | Status: AC
  Administered 2018-12-24: 12:00:00 1000 mL via INTRAVENOUS

## 2018-12-24 MED ORDER — FINASTERIDE 5 MG PO TABS
5.0000 mg | ORAL_TABLET | Freq: Every day | ORAL | Status: DC
  Administered 2018-12-25: 11:00:00 5 mg via ORAL
  Filled 2018-12-24 (×2): qty 1

## 2018-12-24 MED ORDER — HALOPERIDOL LACTATE 5 MG/ML IJ SOLN
5.0000 mg | Freq: Once | INTRAMUSCULAR | Status: AC
  Administered 2018-12-24: 5 mg via INTRAMUSCULAR

## 2018-12-24 MED ORDER — ATORVASTATIN CALCIUM 10 MG PO TABS: 20.0000 mg | ORAL_TABLET | Freq: Every day | ORAL | Status: DC

## 2018-12-24 MED ORDER — INSULIN ASPART 100 UNIT/ML ~~LOC~~ SOLN
10.0000 [IU] | Freq: Once | SUBCUTANEOUS | Status: AC
  Administered 2018-12-24: 10 [IU] via INTRAVENOUS

## 2018-12-24 MED ORDER — ALBUTEROL SULFATE HFA 108 (90 BASE) MCG/ACT IN AERS: 1.0000 | INHALATION_SPRAY | Freq: Four times a day (QID) | RESPIRATORY_TRACT | Status: DC | PRN

## 2018-12-24 MED ORDER — INSULIN GLARGINE 100 UNIT/ML ~~LOC~~ SOLN
25.0000 [IU] | Freq: Every day | SUBCUTANEOUS | Status: DC
  Administered 2018-12-24 – 2018-12-25 (×2): 25 [IU] via SUBCUTANEOUS
  Filled 2018-12-24 (×2): qty 0.25

## 2018-12-24 MED ORDER — HALOPERIDOL LACTATE 5 MG/ML IJ SOLN
5.0000 mg | Freq: Once | INTRAMUSCULAR | Status: DC
  Filled 2018-12-24: qty 1

## 2018-12-24 MED ORDER — LOSARTAN POTASSIUM 50 MG PO TABS: 25.0000 mg | ORAL_TABLET | Freq: Every day | ORAL | Status: DC

## 2018-12-24 NOTE — ED Triage Notes (Signed)
Pt arrives by [facility] from his apartment where sister had my IVC'ed due to paranoid behavior as well as hallucinations.

## 2018-12-24 NOTE — ED Notes (Signed)
Patient has large metal ring that was taken from him and placed in a pink denture cup and placed in belongings already inventoried in locker 7-Monique,RN

## 2018-12-24 NOTE — Progress Notes (Signed)
   12/24/18 1319  General Assessment Data  Reason for not completing assessment [place] attempted to assess pt.  Pt was unable to be aroused.  TTS will assess pt at a later time.   Adelie Croswell L. [place], [place], [facility], [facility] Therapeutic Triage Specialist  [phone]

## 2018-12-24 NOTE — ED Provider Notes (Signed)
[facility] EMERGENCY DEPARTMENT Provider Note   CSN: [id number] Arrival date & time: 12/24/18  1041    History   Chief Complaint Chief Complaint  Patient presents with  . IVC    HPI Dylan Jordan is a 63 y.o. male with history of asthma, diabetes mellitus, gout, hyperlipidemia, hypertension, schizophrenia presenting for evaluation under IVC.  IVC paperwork was taken out by his sister and states that he is exhibiting some bizarre behavior hallucinating as well as being noncompliant with his medications.  Also note the patient was seen yesterday in the ED for altered mental status but was found to ultimately be stable for discharge after work-up.  He tells me "I do not have a sister ".  He states "I do not need to be here, and I am not paranoid ".  Otherwise he is very guarded, exhibits thought blocking and rapid pressured speech.  He does not answer the majority of my questions but in general tells me that physically he feels well with no fever, chest pain, shortness of breath, nausea, vomiting, abdominal pain, headaches, or cough.  He is oriented to person and place and year but not day of the week.     The history is provided by the patient.    Past Medical History:  Diagnosis Date  . Anemia   . Asthma   . Diabetes mellitus without complication ([place])   . Gout   . Hyperlipidemia   . Hyperosmolar hyperglycemic coma due to diabetes mellitus without ketoacidosis ([place])   . Hypertension   . Schizophrenia ([place]) 07/06/2018    Patient Active Problem List   Diagnosis Date Noted  . Anxiety 09/18/2018  . Swelling of both lower extremities 09/13/2018  . Schizophrenia ([place]) 07/06/2018  . Type 2 diabetes mellitus with diabetic chronic kidney disease ([place])   . Anemia   . Hyperlipidemia 12/18/2017  . 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 Intermittent chest pain 11/25/2017  . Allergic rhinitis with a nonallergic component 07/30/2017  . Food allergy  07/30/2017  . Chronic gout involving toe of left foot without tophus 05/18/2017  . Hemorrhoids 05/04/2017  . Haloperidol adverse reaction 04/16/2017  . Chronic low back pain 01/03/2017  . Chronic cough 06/30/2016  . Drug-induced mood disorder ([place]) 06/14/2016  . Type 2 diabetes mellitus with diabetic neuropathy ([place]) 05/26/2016  . Iron deficiency anemia 05/26/2016  . Chronic kidney disease 05/26/2016  . HTN (hypertension) 05/26/2016  . Tobacco abuse 05/26/2016  . History of substance abuse ([place]) 05/26/2016  . History of alcohol abuse 05/26/2016  . Mild persistent asthma 05/26/2016    Past Surgical History:  Procedure Laterality Date  . REPLACEMENT TOTAL KNEE Left   . TOE SURGERY          Home Medications    Prior to Admission medications   Medication Sig Start Date End Date Taking? Authorizing Provider  Accu-Chek Softclix Lancets lancets 1 each by Other route 3 (three) times daily. 09/23/18  Yes [name], DO  acetaminophen (TYLENOL) 500 MG tablet Take 1,000 mg by mouth every 6 (six) hours as needed (for pain).   Yes [provider]  albuterol (PROVENTIL HFA;VENTOLIN HFA) 108 (90 Base) MCG/ACT inhaler Inhale 1-2 puffs into the lungs every 6 (six) hours as needed for wheezing or shortness of breath. 03/25/18  Yes Bobbitt, Sed[place] Muta, MD  allopurinol (ZYLOPRIM) 100 MG tablet Take 2 tablets (200 mg total) by mouth daily. 01/10/18  Yes Hensel, Jamal Collin, MD  atorvastatin (LIPITOR) 20 MG tablet TAKE 1 TABLET BY MOUTH EVERY DAY 09/09/18  Yes [name], [name], DO  BD PEN NEEDLE NANO U/F 32G X 4 MM MISC USE TO INJECT insulin EVERY DAY Patient taking differently: daily.  09/25/18  Yes [name], Levada Dy C, DO  Blood Glucose Monitoring Suppl (ACCU-CHEK AVIVA PLUS) w/Device KIT Use to check blood sugar three times daily or as directed. 09/23/18  Yes [name], DO  ferrous sulfate (FERROUSUL) 325 (65 FE) MG tablet Take 1 tablet (325 mg total) by mouth daily with breakfast.  03/06/18  Yes [name], [name], DO  finasteride (PROSCAR) 5 MG tablet Take 1 tablet (5 mg total) by mouth daily. 08/08/18  Yes [name], [name], DO  gabapentin (NEURONTIN) 300 MG capsule TAKE 1 CAPSULE BY MOUTH EVERY MORNING AND 2 CAPSULES AT BEDTIME 12/09/18  Yes Enid Derry, [place], DO  glucose blood (ACCU-CHEK AVIVA PLUS) test strip Dispense QS for twice daily testing. 09/23/18  Yes [name], [name], DO  Insulin Glargine (LANTUS SOLOSTAR) 100 UNIT/ML Solostar Pen Inject 25 Units into the skin daily. Patient taking differently: Inject 30 Units into the skin daily.  08/14/18  Yes Hensel, Jamal Collin, MD  liraglutide (VICTOZA) 18 MG/3ML SOPN INJECT 0.3ML (1.67m) INTO THE SKIN DAILY 12/09/18  Yes SEnid Derry JMartinique DO  losartan (COZAAR) 25 MG tablet Take 1 tablet (25 mg total) by mouth at bedtime. 12/09/18  Yes SEnid Derry JMartinique DO  NOVOFINE PLUS 32G X 4 MM MISC 1 Container by Does not apply route daily. 05/18/18  Yes [provider]  omeprazole (PRILOSEC) 20 MG capsule TAKE 2 CAPSULES BY MOUTH EVERY EVENING Patient taking differently: Take 40 mg by mouth daily.  11/07/18  Yes RLucila MaineC, DO  tamsulosin (FLOMAX) 0.4 MG CAPS capsule Take 2 capsules (0.8 mg total) by mouth daily. Patient taking differently: Take 0.4 mg by mouth daily.  07/11/18  Yes KWilber Oliphant MD  Probiotic Product (CULTURELLE PROBIOTICS) CHEW Chew 1 capsule by mouth 4 (four) times daily. 07/13/18   Palumbo, April, MD    Family History Family History  Adopted: Yes  Problem Relation Age of Onset  . Allergic rhinitis Neg Hx   . Angioedema Neg Hx   . Asthma Neg Hx   . Atopy Neg Hx   . Eczema Neg Hx   . Immunodeficiency Neg Hx   . Urticaria Neg Hx     Social History Social History   Tobacco Use  . Smoking status: Current Some Day Smoker    Packs/day: 0.30    Years: 45.00    Pack years: 13.50    Types: Cigarettes    Start date: 05/16/1971  . Smokeless tobacco: Never Used  . Tobacco comment: Decreased Intake to 10  cigarettes per week or less.  Substance Use Topics  . Alcohol use: No  . Drug use: No     Allergies   Chantix [varenicline tartrate], Ativan [lorazepam], Shellfish-derived products, and Penicillins   Review of Systems Review of Systems  Constitutional: Negative for chills and fever.  Cardiovascular: Negative for chest pain.  Gastrointestinal: Negative for abdominal pain, nausea and vomiting.  Psychiatric/Behavioral: Positive for hallucinations.  All other systems reviewed and are negative.    Physical Exam Updated Vital Signs BP 132/63 (BP Location: Left Arm)   Pulse 83   Temp 98.7 F (37.1 C) (Oral)   Resp 16   SpO2 98%   Physical Exam Vitals signs and nursing note reviewed.  Constitutional:  General: He is not in acute distress.    Appearance: He is well-developed.  HENT:     Head: Normocephalic and atraumatic.  Eyes:     General:        Right eye: No discharge.        Left eye: No discharge.     Conjunctiva/sclera: Conjunctivae normal.  Neck:     Vascular: No JVD.     Trachea: No tracheal deviation.  Cardiovascular:     Rate and Rhythm: Normal rate and regular rhythm.  Pulmonary:     Effort: Pulmonary effort is normal.     Breath sounds: Normal breath sounds.  Abdominal:     General: Bowel sounds are normal. There is no distension.     Palpations: Abdomen is soft.     Tenderness: There is no abdominal tenderness. There is no guarding or rebound.  Skin:    General: Skin is warm and dry.     Findings: No erythema.  Neurological:     Mental Status: He is alert.     Comments: Fluent speech with no evidence of dysarthria or aphasia, cranial nerves appear grossly intact.  Moves extremity spontaneously without difficulty.  Oriented to person and place but not day of the week.  Psychiatric:        Attention and Perception: He is inattentive.        Mood and Affect: Affect is blunt.        Speech: Speech is rapid and pressured.        Behavior: Behavior  is uncooperative and withdrawn.      ED Treatments / Results  Labs (all labs ordered are listed, but only abnormal results are displayed) Labs Reviewed  COMPREHENSIVE METABOLIC PANEL - Abnormal; Notable for the following components:      Result Value   CO2 20 (*)    Glucose, Bld 345 (*)    Creatinine, Ser 1.98 (*)    Albumin 3.0 (*)    GFR calc non Af Amer 35 (*)    GFR calc Af Amer 41 (*)    All other components within normal limits  ACETAMINOPHEN LEVEL - Abnormal; Notable for the following components:   Acetaminophen (Tylenol), Serum <10 (*)    All other components within normal limits  CBC - Abnormal; Notable for the following components:   RBC 3.59 (*)    Hemoglobin 10.3 (*)    HCT 33.9 (*)    All other components within normal limits  URINALYSIS, ROUTINE W REFLEX MICROSCOPIC - Abnormal; Notable for the following components:   Glucose, UA >=500 (*)    Protein, ur 100 (*)    All other components within normal limits  LACTIC ACID, PLASMA - Abnormal; Notable for the following components:   Lactic Acid, Venous 2.1 (*)    All other components within normal limits  LACTIC ACID, PLASMA - Abnormal; Notable for the following components:   Lactic Acid, Venous 2.7 (*)    All other components within normal limits  CBG MONITORING, ED - Abnormal; Notable for the following components:   Glucose-Capillary 311 (*)    All other components within normal limits  CBG MONITORING, ED - Abnormal; Notable for the following components:   Glucose-Capillary 167 (*)    All other components within normal limits  CBG MONITORING, ED - Abnormal; Notable for the following components:   Glucose-Capillary 271 (*)    All other components within normal limits  SARS CORONAVIRUS 2 (HOSPITAL ORDER, PERFORMED IN [facility])  URINE CULTURE  ETHANOL  SALICYLATE LEVEL  RAPID URINE DRUG SCREEN, HOSP PERFORMED  LACTIC ACID, PLASMA  LACTIC ACID, PLASMA  CBG MONITORING, ED  CBG MONITORING, ED     EKG None  Radiology Ct Head Wo Contrast  Result Date: 12/23/2018 CLINICAL DATA:  Altered level of consciousness EXAM: CT HEAD WITHOUT CONTRAST TECHNIQUE: Contiguous axial images were obtained from the base of the skull through the vertex without intravenous contrast. COMPARISON:  July 04, 2018. FINDINGS: Brain: No evidence of acute territorial infarction, hemorrhage, hydrocephalus,extra-axial collection or mass lesion/mass effect. There is mild dilatation the ventricles and sulci consistent with age-related atrophy. Low-attenuation changes in the deep white matter consistent with small vessel ischemia. Vascular: No hyperdense vessel or unexpected calcification. Skull: The skull is intact. No fracture or focal lesion identified. Sinuses/Orbits: The visualized paranasal sinuses and mastoid air cells are clear. The orbits and globes intact. Other: None IMPRESSION: 1. No acute intracranial abnormality. 2. Findings consistent with mild age related atrophy and chronic small vessel ischemia Electronically Signed   By: Prudencio Pair M.D.   On: 12/23/2018 15:47   Dg Chest Portable 1 View  Result Date: 12/24/2018 CLINICAL DATA:  Cough, hypotension EXAM: PORTABLE CHEST 1 VIEW COMPARISON:  12/23/2018 FINDINGS: Stable cardiomediastinal contours. No focal airspace consolidation, pleural effusion, or pneumothorax. No acute osseous findings. IMPRESSION: No acute cardiopulmonary findings. Electronically Signed   By: Davina Poke M.D.   On: 12/24/2018 12:41   Dg Chest Portable 1 View  Result Date: 12/23/2018 CLINICAL DATA:  Altered mental status. EXAM: PORTABLE CHEST 1 VIEW COMPARISON:  Radiographs of July 13, 2018. FINDINGS: Stable cardiomegaly. No pneumothorax or pleural effusion is noted. Both lungs are clear. The visualized skeletal structures are unremarkable. IMPRESSION: No active disease. Electronically Signed   By: Marijo Conception M.D.   On: 12/23/2018 14:44    Procedures Procedures  (including critical care time)  Medications Ordered in ED Medications  albuterol (VENTOLIN HFA) 108 (90 Base) MCG/ACT inhaler 1-2 puff (has no administration in time range)  atorvastatin (LIPITOR) tablet 20 mg (20 mg Oral Refused 12/24/18 1747)  finasteride (PROSCAR) tablet 5 mg (5 mg Oral Refused 12/24/18 1747)  insulin glargine (LANTUS) injection 25 Units (25 Units Subcutaneous Given 12/24/18 1412)  sodium chloride 0.9 % bolus 1,000 mL (0 mLs Intravenous Stopped 12/24/18 1324)  insulin aspart (novoLOG) injection 10 Units (10 Units Intravenous Given 12/24/18 1412)  sodium chloride 0.9 % bolus 1,000 mL (0 mLs Intravenous Stopped 12/24/18 1735)  haloperidol lactate (HALDOL) injection 5 mg (5 mg Intramuscular Given 12/24/18 2301)     Initial Impression / Assessment and Plan / ED Course  I have reviewed the triage vital signs and the nursing notes.  Pertinent labs & imaging results that were available during my care of the patient were reviewed by me and considered in my medical decision making (see chart for details).  Patient presenting for evaluation under IVC.  He was borderline febrile and tachycardic in triage but was not tachycardic on my initial assessment. Seen in the ED yesterday for lethargy/altered mental status.  On my assessment he is withdrawn, inattentive, somewhat aggressive.  Does not make sense when answering most questions.  Initial set of blood work reviewed by me is reassuring with no leukocytosis, stable anemia, normal anion gap.  Unfortunately he had one blood pressure reading while in the ED that was hypotensive so he was given IV fluid bolus and then extended medical clearance work-up was obtained  which showed an elevated lactate.  He was given IV fluid bolus for this as I suspect that he is dehydrated given mild elevation in his BUN and creatinine compared to yesterday, elevated CBG of 345, and glucosuria on UA from yesterday.  Chest x-ray shows no acute cardiopulmonary  abnormalities, no evidence of pneumonia or pleural effusion.  Head CT from yesterday shows no acute intracranial abnormalities.   Repeat lactate remains elevated after fluid bolus.  The patient overall is still physically well-appearing, sleeping and will not answer questions.  4:00PM Signed out to oncoming provider PA [place].  Second fluid bolus has been ordered.  We will recheck lactate after this and if improved the patient will be medically cleared.  I see no obvious source of infection but we are still waiting on a repeat UA however he had no signs of infection on UA yesterday.  Remainder of patient's blood work is reassuring and repeat CBG is improved significantly.  If his UA shows findings consistent with UTI, he may require admission given elevated lactate and tachycardia initially.  Final Clinical Impressions(s) / ED Diagnoses   Final diagnoses:  Involuntary commitment    ED Discharge Orders    None       Renita Papa, PA-C 12/25/18 [place], [place], DO 12/25/18 [phone]
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
## 2018-12-24 NOTE — ED Notes (Signed)
Patient refused vitals-Monique,RN  

## 2018-12-24 NOTE — ED Notes (Signed)
Ordered tray for pt 

## 2018-12-24 NOTE — ED Notes (Signed)
Pt calm,cooperative and acting appropriately at this time.

## 2018-12-24 NOTE — ED Notes (Signed)
CBG 271  

## 2018-12-24 NOTE — ED Notes (Signed)
Pt sleepy b/p dropped into the 41'H systolic IV started and bolus given

## 2018-12-24 NOTE — ED Notes (Signed)
Patient in room talking to person not there; Pt is slightly verbally aggressive toward staff but able to be redirected at this time-Monique,RN
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
## 2018-12-24 NOTE — Progress Notes (Signed)
Pt meets inpatient criteria per Anette Riedel, NP. Referral information has been sent to the following hospitals for review:  [facility]  [place]  [facility]      Disposition will continue to assist with inpatient placement needs.   Audree Camel, LCSW, [place] Disposition [facility]/TTS [phone]

## 2018-12-24 NOTE — ED Provider Notes (Signed)
Care handoff [facility] from [facility] Fawze PA-C at shift change please see her note for full details of visit.  In short 63 year old male with history of diabetes, schizophrenia, hyperlipidemia presents under IVC for manic behavior.  Patient was initially tachycardic and hypertensive on arrival, felt by previous team this likely related to dehydration, symptoms improved with IV fluids.  Rectal temperature within normal limits, afebrile.  Lactic acid somewhat elevated again likely secondary to dehydration.  No leukocytosis.  Noted to be hyperglycemic improved following home insulin, no evidence of DKA.    Plan of care at shift change is to await completion of fluid bolus and recheck of lactic acid, follow-up on pending urinalysis with improvement and no evidence of UTI patient will be medically cleared for TTS evaluation. Physical Exam  BP (!) 156/92   Pulse 64   Temp 98.6 F (37 C) (Oral)   Resp 20   SpO2 97%   Physical Exam Constitutional:      General: He is not in acute distress.    Appearance: Normal appearance. He is well-developed. He is not ill-appearing or diaphoretic.  HENT:     Head: Normocephalic and atraumatic.     Right Ear: External ear normal.     Left Ear: External ear normal.     Nose: Nose normal.  Eyes:     General: Vision grossly intact. Gaze aligned appropriately.     Pupils: Pupils are equal, round, and reactive to light.  Neck:     Musculoskeletal: Normal range of motion.     Trachea: Trachea and phonation normal. No tracheal deviation.  Pulmonary:     Effort: Pulmonary effort is normal. No respiratory distress.  Musculoskeletal: Normal range of motion.  Skin:    General: Skin is warm and dry.  Neurological:     Mental Status: He is alert.     GCS: GCS eye subscore is 4. GCS verbal subscore is 5. GCS motor subscore is 6.     Comments: Speech is clear and goal oriented, follows commands Major Cranial nerves without deficit, no facial droop Moves extremities  without ataxia, coordination intact  Psychiatric:        Behavior: Behavior normal.    ED Course/Procedures   Clinical Course as of Dec 23 2013  Tue Dec 24, 2018  1606 Follow urine, fluid bolus, recheck lactic, then medically cleared for psych.   [BM]    Clinical Course User Index [BM] Bill SalinasMorelli, Brilyn Tuller A, PA-C    Procedures  MDM  UDS negative Most recent CBG 271 Urinalysis with glucose and protein, no signs of infection Tylenol level negative Salicylate level negative Ethanol level negative CMP with creatinine 1.98 slightly elevated from baseline likely secondary to dehydration, patient has been rehydrated here in the ER, anion gap 9 CBC nonacute  Lactic acid improved at 1.8 following fluid bolus - Patient reassessed resting comfortably sitting up on edge of bed requesting dinner tray.  He has no new complaints reports he is feeling well.  Work-up overall reassuring low suspicion for infection at this time suspect previous lab abnormalities secondary to dehydration, he is eating and drinking here in the emergency department.  At this time there does not appear to be any evidence of an acute emergency medical condition patient is medically cleared for psychiatric evaluation. --------- Patient verbally abusive to staff members, agitated, 5 mg Haldol ordered for acute agitation.  Patient to be moved to psychiatric observation unit.  Patient remains medically cleared at this time.  Note:  Portions of this report may have been transcribed using voice recognition software. Every effort was made to ensure accuracy; however, inadvertent computerized transcription errors may still be present.   Gari Crown 12/24/18 2020    Charlesetta Shanks, MD 12/30/18 [phone]

## 2018-12-24 NOTE — ED Provider Notes (Addendum)
Medical screening examination/treatment/procedure(s) were conducted as a shared visit with non-physician practitioner(s) and myself.  I personally evaluated the patient during the encounter. Briefly, the patient is a 63 y.o. male with history of diabetes, high cholesterol, schizophrenia who presents the ED with IVC in place for manic behavior.  Patient originally came to the ED tachycardic but has now resolved.  Had a low blood pressure initially but that improved without fluids.  He did not have fever.  Rectal temperature was normal.  Expanded medical clearance was obtained with chest x-ray, urinalysis, lactic acid given that he had some brief abnormality in his vitals.  However he has no significant leukocytosis.  Lactic acid was 2.1 which could be secondary to dehydration.  He was given a fluid bolus.  Blood sugar was elevated to 347.  He was given his home Lantus dose and given 10 units of NovoLog.  Repeat blood sugars improved.  Otherwise lab work is unremarkable.  Chest x-ray showed no signs of infection. He is difficult to evaluate on exam.  He seems to not want to speak to anybody.  When I engaged the patient he does answer questions appropriately but mostly wants to be left alone.  He does not appear to exhibit any acute manic behavior upon my care overall he just appears withdrawn.  He was given his monthly dose of Haldol yesterday.   Repeat lactic acid is 2.7.  Will give additional fluid bolus.  Awaiting urinalysis.  Have very low concern for infectious process.  This is likely in the setting of dehydration from poorly controlled diabetes and poor p.o. intake.  Patient has now been able to eat and drink since being here. Blood sugar has improved.  Patient's mental status also improving.  Home medications to be confirmed.  Have ordered his home Lantus. TTS will evaluate the patient eventually.  He he would not wake up to talk with them.   This chart was dictated using voice recognition software.   Despite best efforts to proofread,  errors can occur which can change the documentation meaning.      Lennice Sites, DO 12/24/18 Garden, [place], DO 12/24/18 Badger, [place], DO 12/24/18 1555

## 2018-12-24 NOTE — BHH Counselor (Signed)
Beckie Busing, RN to fax pt's IVC paperwork.    Vertell Novak, [place], [facility], [facility] Triage Specialist [phone]

## 2018-12-24 NOTE — ED Notes (Signed)
Pt up now yelling and cursing at staff , pt redirected and calmed down , sitter bedside

## 2018-12-25 ENCOUNTER — Other Ambulatory Visit: Payer: Self-pay

## 2018-12-25 ENCOUNTER — Encounter (HOSPITAL_COMMUNITY): Payer: Self-pay | Admitting: Emergency Medicine

## 2018-12-25 ENCOUNTER — Emergency Department (HOSPITAL_COMMUNITY)
Admission: EM | Admit: 2018-12-25 | Discharge: 2018-12-26 | Disposition: A | Discharge status: Other Acute Inpatient Hospital | Payer: Medicaid Other | Source: Home / Self Care | Attending: Emergency Medicine | Admitting: Emergency Medicine

## 2018-12-25 ENCOUNTER — Emergency Department (HOSPITAL_COMMUNITY): Payer: Medicaid Other

## 2018-12-25 DIAGNOSIS — F200 Paranoid schizophrenia: Secondary | ICD-10-CM

## 2018-12-25 DIAGNOSIS — R509 Fever, unspecified: Secondary | ICD-10-CM

## 2018-12-25 LAB — CBC WITH DIFFERENTIAL/PLATELET
Abs Immature Granulocytes: 0.04 10*3/uL (ref 0.00–0.07)
Basophils Absolute: 0 10*3/uL (ref 0.0–0.1)
Basophils Relative: 0 %
Eosinophils Absolute: 0.2 10*3/uL (ref 0.0–0.5)
Eosinophils Relative: 2 %
HCT: 35.8 % — ABNORMAL LOW (ref 39.0–52.0)
Hemoglobin: 11.1 g/dL — ABNORMAL LOW (ref 13.0–17.0)
Immature Granulocytes: 1 %
Lymphocytes Relative: 14 %
Lymphs Abs: 1.1 10*3/uL (ref 0.7–4.0)
MCH: 29.1 pg (ref 26.0–34.0)
MCHC: 31 g/dL (ref 30.0–36.0)
MCV: 93.7 fL (ref 80.0–100.0)
Monocytes Absolute: 0.9 10*3/uL (ref 0.1–1.0)
Monocytes Relative: 12 %
Neutro Abs: 5.8 10*3/uL (ref 1.7–7.7)
Neutrophils Relative %: 71 %
Platelets: 211 10*3/uL (ref 150–400)
RBC: 3.82 MIL/uL — ABNORMAL LOW (ref 4.22–5.81)
RDW: 14.3 % (ref 11.5–15.5)
WBC: 8 10*3/uL (ref 4.0–10.5)
nRBC: 0 % (ref 0.0–0.2)

## 2018-12-25 LAB — URINALYSIS, ROUTINE W REFLEX MICROSCOPIC
Bilirubin Urine: NEGATIVE
Glucose, UA: >=500 mg/dL — AB
Ketones, ur: NEGATIVE mg/dL
Leukocytes,Ua: NEGATIVE
Nitrite: NEGATIVE
Protein, ur: 100 mg/dL — AB
Specific Gravity, Urine: 1.005 (ref 1.005–1.030)
pH: 6 (ref 5.0–8.0)

## 2018-12-25 LAB — COMPREHENSIVE METABOLIC PANEL
ALT: 21 U/L (ref 0–44)
AST: 19 U/L (ref 15–41)
Albumin: 3 g/dL — ABNORMAL LOW (ref 3.5–5.0)
Alkaline Phosphatase: 76 U/L (ref 38–126)
Anion gap: 8 (ref 5–15)
BUN: 17 mg/dL (ref 8–23)
CO2: 19 mmol/L — ABNORMAL LOW (ref 22–32)
Calcium: 9.4 mg/dL (ref 8.9–10.3)
Chloride: 107 mmol/L (ref 98–111)
Creatinine, Ser: 1.58 mg/dL — ABNORMAL HIGH (ref 0.61–1.24)
GFR calc Af Amer: 54 mL/min — ABNORMAL LOW (ref >60)
GFR calc non Af Amer: 46 mL/min — ABNORMAL LOW (ref >60)
Glucose, Bld: 385 mg/dL — ABNORMAL HIGH (ref 70–99)
Potassium: 4.3 mmol/L (ref 3.5–5.1)
Sodium: 134 mmol/L — ABNORMAL LOW (ref 135–145)
Total Bilirubin: 0.2 mg/dL — ABNORMAL LOW (ref 0.3–1.2)
Total Protein: 6.8 g/dL (ref 6.5–8.1)

## 2018-12-25 LAB — URINE CULTURE: Culture: NO GROWTH

## 2018-12-25 LAB — RAPID URINE DRUG SCREEN, HOSP PERFORMED
Amphetamines: NOT DETECTED
Barbiturates: NOT DETECTED
Benzodiazepines: NOT DETECTED
Cocaine: NOT DETECTED
Opiates: NOT DETECTED
Tetrahydrocannabinol: NOT DETECTED

## 2018-12-25 LAB — CBG MONITORING, ED: Glucose-Capillary: 337 mg/dL — ABNORMAL HIGH (ref 70–99)

## 2018-12-25 LAB — ACETAMINOPHEN LEVEL: Acetaminophen (Tylenol), Serum: <10 ug/mL — ABNORMAL LOW (ref 10–30)

## 2018-12-25 LAB — GROUP A STREP BY PCR: Group A Strep by PCR: NOT DETECTED

## 2018-12-25 LAB — CK: Total CK: 282 U/L (ref 49–397)

## 2018-12-25 LAB — LIPASE, BLOOD: Lipase: 37 U/L (ref 11–51)

## 2018-12-25 LAB — LACTIC ACID, PLASMA: Lactic Acid, Venous: 1.9 mmol/L (ref 0.5–1.9)

## 2018-12-25 LAB — SARS CORONAVIRUS 2 BY RT PCR (HOSPITAL ORDER, PERFORMED IN ~~LOC~~ HOSPITAL LAB)
SARS Coronavirus 2: NEGATIVE
SARS Coronavirus 2: NEGATIVE

## 2018-12-25 LAB — ETHANOL: Alcohol, Ethyl (B): <10 mg/dL (ref <10)

## 2018-12-25 LAB — C-REACTIVE PROTEIN: CRP: 4.9 mg/dL — ABNORMAL HIGH (ref <1.0)

## 2018-12-25 MED ORDER — ACETAMINOPHEN 500 MG PO TABS
1000.0000 mg | ORAL_TABLET | Freq: Four times a day (QID) | ORAL | Status: DC | PRN
  Administered 2018-12-25 – 2018-12-26 (×2): 1000 mg via ORAL
  Filled 2018-12-25 (×2): qty 2

## 2018-12-25 MED ORDER — GABAPENTIN 100 MG PO CAPS
100.0000 mg | ORAL_CAPSULE | Freq: Three times a day (TID) | ORAL | Status: DC
  Administered 2018-12-25 – 2018-12-26 (×2): 100 mg via ORAL
  Filled 2018-12-25 (×2): qty 1

## 2018-12-25 MED ORDER — INSULIN GLARGINE 100 UNIT/ML ~~LOC~~ SOLN
25.0000 [IU] | Freq: Once | SUBCUTANEOUS | Status: AC
  Administered 2018-12-25: 25 [IU] via SUBCUTANEOUS
  Filled 2018-12-25: qty 0.25

## 2018-12-25 MED ORDER — INSULIN GLARGINE 100 UNIT/ML SOLOSTAR PEN: 30.0000 [IU] | PEN_INJECTOR | Freq: Every day | SUBCUTANEOUS | Status: DC

## 2018-12-25 MED ORDER — ATORVASTATIN CALCIUM 10 MG PO TABS
20.0000 mg | ORAL_TABLET | Freq: Every day | ORAL | Status: DC
  Administered 2018-12-26: 20 mg via ORAL
  Filled 2018-12-25: qty 2

## 2018-12-25 MED ORDER — PANTOPRAZOLE SODIUM 40 MG PO TBEC
40.0000 mg | DELAYED_RELEASE_TABLET | Freq: Every day | ORAL | Status: DC
  Administered 2018-12-26: 40 mg via ORAL
  Filled 2018-12-25: qty 1

## 2018-12-25 MED ORDER — ALBUTEROL SULFATE HFA 108 (90 BASE) MCG/ACT IN AERS: 1.0000 | INHALATION_SPRAY | Freq: Four times a day (QID) | RESPIRATORY_TRACT | Status: DC | PRN

## 2018-12-25 MED ORDER — LOSARTAN POTASSIUM 50 MG PO TABS
25.0000 mg | ORAL_TABLET | Freq: Every day | ORAL | Status: DC
  Administered 2018-12-25: 25 mg via ORAL
  Filled 2018-12-25: qty 1

## 2018-12-25 MED ORDER — CLOTRIMAZOLE 10 MG MT TROC: 10.0000 mg | Freq: Three times a day (TID) | OROMUCOSAL | 1 refills | Status: DC

## 2018-12-25 MED ORDER — ZOLPIDEM TARTRATE 5 MG PO TABS
5.0000 mg | ORAL_TABLET | Freq: Once | ORAL | Status: AC
  Administered 2018-12-25: 5 mg via ORAL
  Filled 2018-12-25: qty 1

## 2018-12-25 MED ORDER — INSULIN GLARGINE 100 UNIT/ML ~~LOC~~ SOLN
30.0000 [IU] | Freq: Every day | SUBCUTANEOUS | Status: DC
  Administered 2018-12-26: 30 [IU] via SUBCUTANEOUS
  Filled 2018-12-25: qty 0.3

## 2018-12-25 MED ORDER — TAMSULOSIN HCL 0.4 MG PO CAPS
0.4000 mg | ORAL_CAPSULE | Freq: Every day | ORAL | Status: DC
  Administered 2018-12-26: 0.4 mg via ORAL
  Filled 2018-12-25: qty 1

## 2018-12-25 MED ORDER — ALLOPURINOL 100 MG PO TABS
200.0000 mg | ORAL_TABLET | Freq: Every day | ORAL | Status: DC
  Administered 2018-12-26: 200 mg via ORAL
  Filled 2018-12-25: qty 2

## 2018-12-25 MED ORDER — FINASTERIDE 5 MG PO TABS
5.0000 mg | ORAL_TABLET | Freq: Every day | ORAL | Status: DC
  Administered 2018-12-26: 5 mg via ORAL
  Filled 2018-12-25: qty 1

## 2018-12-25 NOTE — ED Triage Notes (Signed)
TC Sheriff for transport to [facility] . Pt is IVCed

## 2018-12-25 NOTE — ED Triage Notes (Signed)
Pt reports he can not eat his food because the bacon is touching his french toast . Pt does not eat bacon . A second  Meal ordered.

## 2018-12-25 NOTE — ED Triage Notes (Signed)
Pt BIB Sheriffs office. Per officer pt was being transported to [facility]. Officer stated that the transport Lucianne Lei does get very hot and upon arrival at [facility] the pt temperature was checked and it was 102.

## 2018-12-25 NOTE — ED Notes (Signed)
Patient was given a snack and drink. A Diet was ordered for Lunch. 

## 2018-12-25 NOTE — BH Assessment (Signed)
Dylan Jordan has accepted patient for inpatient treatment.    - Accepting provider is Dr. Dareen Piano.   - Patient will be transported to the [facility] [place], Unit A  -  Nurse to Nurse Report number is [phone])  Patient can transport at anytime, bed is available.    Dylan Jordan requested a copy of the patient's IVC paperwork. Disposition [name] will fax to [facility] at [phone].   Gordan Payment, RN notified.    Radonna Ricker, MSW, [place] Social Worker [facility]  Phone: [phone]

## 2018-12-25 NOTE — ED Triage Notes (Signed)
TC from [facility] from [facility] reporting on arrival to facility Pt had a 101 oral temp. Roderic Palau requested the results of COVID screen to be faxed to him. Pt did not have fever when he left [facility] for [facility]. Safety Zone filled out for delay in care. Gabriel Cirri and Melissa informed of [facility]'s refusal of PT. Medical Director Dr. Abbey Chatters made the decision to refuse Pt. Per Roderic Palau at [facility].

## 2018-12-25 NOTE — ED Notes (Signed)
Patient Dylan Jordan was 337 the Nurse was informed.

## 2018-12-25 NOTE — ED Triage Notes (Signed)
Pt refusing all meds until he calls [name] . Pt at desk now on phone making call.

## 2018-12-25 NOTE — ED Notes (Signed)
Patient verbalizes understanding of discharge instructions . Opportunity for questions and answers were provided . Armband removed by staff ,Pt discharged from ED. W/C  offered at D/C  and Declined W/C at D/C and was escorted to lobby by RN.  

## 2018-12-25 NOTE — ED Provider Notes (Signed)
Emergency Medicine Observation Re-evaluation Note  Dylan Jordan is a 63 y.o. male, seen on rounds today.  Pt initially presented to the ED for complaints of IVC Currently, the patient is awaiting inpatient placement  Physical Exam  BP 132/63 (BP Location: Left Arm)   Pulse 83   Temp 98.7 F (37.1 C) (Oral)   Resp 16   SpO2 98%  Physical Exam  WDWN NAD Normal resp rate ED Course / MDM  EKG:  Clinical Course as of Dec 24 809  Tue Dec 24, 2018  1606 Follow urine, fluid bolus, recheck lactic, then medically cleared for psych.   [BM]    Clinical Course User Index [BM] Deliah Boston, PA-C   I have reviewed the labs performed to date as well as medications administered while in observation.  Recent changes in the last 24 hours include as below  Glucose  Date Value Ref Range Status  09/18/2018 186 (H) 65 - 99 mg/dL Final  07/15/2018 205 (H) 65 - 99 mg/dL Final   Glucose, Bld  Date Value Ref Range Status  12/24/2018 345 (H) 70 - 99 mg/dL Final  12/23/2018 433 (H) 70 - 99 mg/dL Final  07/12/2018 192 (H) 70 - 99 mg/dL Final  07/08/2018 371 (H) 70 - 99 mg/dL Final    Plan  Current plan is for inpatient psych. Patient is under full IVC at this time.   Margarita Mail, PA-C 12/25/18 1643    Sherwood Gambler, MD 12/26/18 [phone]

## 2018-12-25 NOTE — ED Notes (Signed)
Dinner tray ordered.

## 2018-12-25 NOTE — ED Provider Notes (Signed)
[place] EMERGENCY DEPARTMENT Provider Note   CSN: [id number] Arrival date & time: 12/25/18  1514     History   Chief Complaint Chief Complaint  Patient presents with  . Fever    HPI Dylan Jordan is a 63 y.o. male.     HPI Patient had been in the emergency department and evaluated for psychiatric admission.  He was accepted to the old [facility] facility.  Upon arrival he had fever to 101.4.  I have spoken directly with Dr.Raj Thotakura who advises they are still planning to accept the patient for psychiatric management but requests that he have a repeat medical evaluation including a repeat COVID test to make sure patient is still medically clear.  Patient does not have any real focal complaints.  His area of focus at this time is that he gets 3 more blankets set on the stool beside his bed and that he gets to take a shower before he has any of his additional diagnostic work-up done.  He reports sometimes his left knee hurts but it has an old surgical wound and that is a more chronic problem.  He also reports he is noted sometimes he is got kind of a sore throat when he wakes up in the morning.  Other than this he does not have any focal physical complaints.  Prior to recognizing that I was his physician, the patient was quite adamant about getting things he needed in the room and enunciating his demands.  He did become moderately more cooperative and amenable for a period of time during my history and physical.  This however began to change rather quickly again towards the end of the evaluation and he doubled back again on insisting on his blankets and shower.  He does not express need to immediately use the blankets but definitely wants them stacked on the stool beside his bed. Past Medical History:  Diagnosis Date  . Anemia   . Asthma   . Diabetes mellitus without complication ([place])   . Gout   . Hyperlipidemia   . Hyperosmolar hyperglycemic coma due to diabetes  mellitus without ketoacidosis ([place])   . Hypertension   . Schizophrenia ([place]) 07/06/2018    Patient Active Problem List   Diagnosis Date Noted  . Anxiety 09/18/2018  . Swelling of both lower extremities 09/13/2018  . Schizophrenia ([place]) 07/06/2018  . Type 2 diabetes mellitus with diabetic chronic kidney disease ([place])   . Anemia   . Hyperlipidemia 12/18/2017  . 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 Intermittent chest pain 11/25/2017  . Allergic rhinitis with a nonallergic component 07/30/2017  . Food allergy 07/30/2017  . Chronic gout involving toe of left foot without tophus 05/18/2017  . Hemorrhoids 05/04/2017  . Haloperidol adverse reaction 04/16/2017  . Chronic low back pain 01/03/2017  . Chronic cough 06/30/2016  . Drug-induced mood disorder ([place]) 06/14/2016  . Type 2 diabetes mellitus with diabetic neuropathy ([place]) 05/26/2016  . Iron deficiency anemia 05/26/2016  . Chronic kidney disease 05/26/2016  . HTN (hypertension) 05/26/2016  . Tobacco abuse 05/26/2016  . History of substance abuse ([place]) 05/26/2016  . History of alcohol abuse 05/26/2016  . Mild persistent asthma 05/26/2016    Past Surgical History:  Procedure Laterality Date  . REPLACEMENT TOTAL KNEE Left   . TOE SURGERY          Home Medications    Prior to Admission medications   Medication Sig Start Date End Date Taking?  Authorizing Provider  Accu-Chek Softclix Lancets lancets 1 each by Other route 3 (three) times daily. 09/23/18   [name], DO  acetaminophen (TYLENOL) 500 MG tablet Take 1,000 mg by mouth every 6 (six) hours as needed (for pain).    [provider]  albuterol (PROVENTIL HFA;VENTOLIN HFA) 108 (90 Base) MCG/ACT inhaler Inhale 1-2 puffs into the lungs every 6 (six) hours as needed for wheezing or shortness of breath. 03/25/18   Bobbitt, [place] Muta, MD  allopurinol (ZYLOPRIM) 100 MG tablet Take 2 tablets (200 mg total) by mouth daily. 01/10/18   Zenia Resides, MD  atorvastatin (LIPITOR) 20 MG tablet TAKE 1 TABLET BY MOUTH EVERY DAY 09/09/18   [name], Levada Dy C, DO  BD PEN NEEDLE NANO U/F 32G X 4 MM MISC USE TO INJECT insulin EVERY DAY Patient taking differently: daily.  09/25/18   [name], DO  Blood Glucose Monitoring Suppl (ACCU-CHEK AVIVA PLUS) w/Device KIT Use to check blood sugar three times daily or as directed. 09/23/18   [name], DO  ferrous sulfate (FERROUSUL) 325 (65 FE) MG tablet Take 1 tablet (325 mg total) by mouth daily with breakfast. 03/06/18   [name], DO  finasteride (PROSCAR) 5 MG tablet Take 1 tablet (5 mg total) by mouth daily. 08/08/18   [name], DO  gabapentin (NEURONTIN) 300 MG capsule TAKE 1 CAPSULE BY MOUTH EVERY MORNING AND 2 CAPSULES AT BEDTIME 12/09/18   Enid Derry, [place], DO  glucose blood (ACCU-CHEK AVIVA PLUS) test strip Dispense QS for twice daily testing. 09/23/18   [name], DO  Insulin Glargine (LANTUS SOLOSTAR) 100 UNIT/ML Solostar Pen Inject 25 Units into the skin daily. Patient taking differently: Inject 30 Units into the skin daily.  08/14/18   Zenia Resides, MD  liraglutide (VICTOZA) 18 MG/3ML SOPN INJECT 0.3ML (1.65m) INTO THE SKIN DAILY 12/09/18   Shirley, JMartinique DO  losartan (COZAAR) 25 MG tablet Take 1 tablet (25 mg total) by mouth at bedtime. 12/09/18   Shirley, JMartinique DO  NOVOFINE PLUS 32G X 4 MM MISC 1 Container by Does not apply route daily. 05/18/18   [provider]  omeprazole (PRILOSEC) 20 MG capsule TAKE 2 CAPSULES BY MOUTH EVERY EVENING Patient taking differently: Take 40 mg by mouth daily.  11/07/18   RSteve Rattler DO  Probiotic Product (CULTURELLE PROBIOTICS) CHEW Chew 1 capsule by mouth 4 (four) times daily. 07/13/18   Palumbo, April, MD  tamsulosin (FLOMAX) 0.4 MG CAPS capsule Take 2 capsules (0.8 mg total) by mouth daily. Patient taking differently: Take 0.4 mg by mouth daily.  07/11/18   KWilber Oliphant MD    Family History Family  History  Adopted: Yes  Problem Relation Age of Onset  . Allergic rhinitis Neg Hx   . Angioedema Neg Hx   . Asthma Neg Hx   . Atopy Neg Hx   . Eczema Neg Hx   . Immunodeficiency Neg Hx   . Urticaria Neg Hx     Social History Social History   Tobacco Use  . Smoking status: Current Some Day Smoker    Packs/day: 0.30    Years: 45.00    Pack years: 13.50    Types: Cigarettes    Start date: 05/16/1971  . Smokeless tobacco: Never Used  . Tobacco comment: Decreased Intake to 10 cigarettes per week or less.  Substance Use Topics  . Alcohol use: No  . Drug use: No  Allergies   Chantix [varenicline tartrate], Ativan [lorazepam], Shellfish-derived products, and Penicillins   Review of Systems Review of Systems 10 Systems reviewed and are negative for acute change except as noted in the HPI.   Physical Exam Updated Vital Signs There were no vitals taken for this visit.  Physical Exam Constitutional:      Comments: Patient is alert and in no distress.  As I enter the room he is immediately telling me what he needs and wants.  No respiratory distress.  HENT:     Head: Normocephalic and atraumatic.     Nose: Nose normal.     Mouth/Throat:     Comments: Airway is widely patent.  Patient does have a bit of white plaque spots on his tongue. Eyes:     Extraocular Movements: Extraocular movements intact.     Conjunctiva/sclera: Conjunctivae normal.  Neck:     Musculoskeletal: Neck supple.  Cardiovascular:     Rate and Rhythm: Normal rate and regular rhythm.  Pulmonary:     Effort: Pulmonary effort is normal.     Breath sounds: Normal breath sounds.  Abdominal:     General: There is no distension.     Palpations: Abdomen is soft.     Tenderness: There is no abdominal tenderness. There is no guarding.  Musculoskeletal:     Comments: No peripheral edema.  Feet are in good condition.  There are no wounds or sores on the feet no peripheral appearance of any cellulitis.   Patient has a well-healed surgical scar on the left knee.  He states this is an area of frequent pain but there is no signs of effusion or any acute problems.  Upper extremities normal in appearance.  Skin:    General: Skin is warm and dry.     Findings: No rash.  Neurological:     General: No focal deficit present.     Cranial Nerves: No cranial nerve deficit.     Coordination: Coordination normal.     Comments: Patient is clear in his speech.  He is very demanding and has pressured speech at times.  He seems to wander off subject.  He does not have any receptive or expressive aphasia.  His movements are coordinated purposeful symmetric.      ED Treatments / Results  Labs (all labs ordered are listed, but only abnormal results are displayed) Labs Reviewed  CULTURE, BLOOD (ROUTINE X 2)  CULTURE, BLOOD (ROUTINE X 2)  SARS CORONAVIRUS 2 (HOSPITAL ORDER, [place])  COMPREHENSIVE METABOLIC PANEL  ETHANOL  ACETAMINOPHEN LEVEL  LIPASE, BLOOD  LACTIC ACID, PLASMA  LACTIC ACID, PLASMA  CBC WITH DIFFERENTIAL/PLATELET  URINALYSIS, ROUTINE W REFLEX MICROSCOPIC  RAPID URINE DRUG SCREEN, HOSP PERFORMED  RPR  HIV ANTIBODY (ROUTINE TESTING W REFLEX)  CK  C-REACTIVE PROTEIN  GC/CHLAMYDIA PROBE AMP ([place]) NOT AT [facility]    EKG None  Radiology Ct Head Wo Contrast  Result Date: 12/23/2018 CLINICAL DATA:  Altered level of consciousness EXAM: CT HEAD WITHOUT CONTRAST TECHNIQUE: Contiguous axial images were obtained from the base of the skull through the vertex without intravenous contrast. COMPARISON:  July 04, 2018. FINDINGS: Brain: No evidence of acute territorial infarction, hemorrhage, hydrocephalus,extra-axial collection or mass lesion/mass effect. There is mild dilatation the ventricles and sulci consistent with age-related atrophy. Low-attenuation changes in the deep white matter consistent with small vessel ischemia. Vascular: No hyperdense vessel or  unexpected calcification. Skull: The skull is intact. No fracture or  focal lesion identified. Sinuses/Orbits: The visualized paranasal sinuses and mastoid air cells are clear. The orbits and globes intact. Other: None IMPRESSION: 1. No acute intracranial abnormality. 2. Findings consistent with mild age related atrophy and chronic small vessel ischemia Electronically Signed   By: Prudencio Pair M.D.   On: 12/23/2018 15:47   Dg Chest Portable 1 View  Result Date: 12/24/2018 CLINICAL DATA:  Cough, hypotension EXAM: PORTABLE CHEST 1 VIEW COMPARISON:  12/23/2018 FINDINGS: Stable cardiomediastinal contours. No focal airspace consolidation, pleural effusion, or pneumothorax. No acute osseous findings. IMPRESSION: No acute cardiopulmonary findings. Electronically Signed   By: Davina Poke M.D.   On: 12/24/2018 12:41    Procedures Procedures (including critical care time)  Medications Ordered in ED Medications - No data to display   Initial Impression / Assessment and Plan / ED Course  I have reviewed the triage vital signs and the nursing notes.  Pertinent labs & imaging results that were available during my care of the patient were reviewed by me and considered in my medical decision making (see chart for details).       Patient had arrived at old [facility] by transport under IVC condition for schizophrenia with possible threat to others or self and schizophrenic decompensation with inability to care for himself.  On arrival he reportedly had a fever up to 101.4.  The physician at the facility, Dr. Elaina Hoops with that he be reassessed at the emergency department for any possible infectious etiology.  He was thusly returned to the emergency department.  I did have a conversation with this physician.  He advised he was very willing to take the patient back for continued management of his schizophrenia but requested that we would do another medical evaluation including a chest x-ray and a repeat  COVID to make sure that there was no signs of infectious etiology.  Patient is not ill in appearance.  Diagnostic work-up does not suggest other infectious problems.  She has minimal complaints regarding physical issues.  He reported some sore throat at times.  He does on exam appear to have some mild oral thrush.  It would be appropriate to start him on Mycelex troches and continue his other regular scheduled medications.  His blood sugar is elevated at 348 but he has previously been noncompliant with his insulin and management of this.  He was given Lantus prior to planned transfer.  No signs the patient is in hyperosmolar crisis or DKA.  He should respond appropriately to insulin and diet.  At this time he is cleared again for psychiatric admission.  Final Clinical Impressions(s) / ED Diagnoses   Final diagnoses:  Fever, unspecified fever cause  Paranoid schizophrenia [facility])    ED Discharge Orders    None       Charlesetta Shanks, MD 12/25/18 1858

## 2018-12-25 NOTE — ED Notes (Signed)
[name] had to leave VM x2 for sheriff pickup, waiting for call back

## 2018-12-25 NOTE — ED Notes (Signed)
1:1 sitter arrived to room

## 2018-12-26 ENCOUNTER — Telehealth: Payer: Self-pay | Admitting: Pharmacist

## 2018-12-26 LAB — HIV ANTIBODY (ROUTINE TESTING W REFLEX): HIV Screen 4th Generation wRfx: NONREACTIVE

## 2018-12-26 LAB — CBG MONITORING, ED: Glucose-Capillary: 334 mg/dL — ABNORMAL HIGH (ref 70–99)

## 2018-12-26 LAB — RPR: RPR Ser Ql: NONREACTIVE

## 2018-12-26 MED ORDER — ZIPRASIDONE MESYLATE 20 MG IM SOLR
10.0000 mg | Freq: Once | INTRAMUSCULAR | Status: DC
  Filled 2018-12-26: qty 20

## 2018-12-26 MED ORDER — STERILE WATER FOR INJECTION IJ SOLN
INTRAMUSCULAR | Status: AC
  Administered 2018-12-26: 04:00:00
  Filled 2018-12-26: qty 10

## 2018-12-26 NOTE — ED Provider Notes (Signed)
Accepted to [facility] by Dr. Dyke Maes, MD 12/26/18 1131

## 2018-12-26 NOTE — ED Notes (Signed)
Pt given diet coke. 

## 2018-12-26 NOTE — ED Notes (Signed)
[facility] [place].

## 2018-12-26 NOTE — ED Notes (Signed)
Pt awaiting police transport to [facility].

## 2018-12-26 NOTE — ED Notes (Signed)
CBG 145  

## 2018-12-26 NOTE — ED Notes (Signed)
Report given to RN at [facility]

## 2018-12-26 NOTE — ED Notes (Signed)
Breakfast ordered 

## 2018-12-26 NOTE — Telephone Encounter (Signed)
-----   Message from Leavy Cella, [facility] sent at 12/19/2018  2:10 PM EDT ----- Regarding: tobacco and DM

## 2018-12-26 NOTE — Telephone Encounter (Signed)
No answer (unable to take call message) after X 3 attempts.   Will attempt contact next week.

## 2018-12-26 NOTE — ED Notes (Signed)
This Rn Chiropodist Paschal at [phone] for transport. He will call this RN back when they are in route to get the patient.

## 2018-12-27 LAB — GC/CHLAMYDIA PROBE AMP (~~LOC~~) NOT AT ARMC
Chlamydia: NEGATIVE
Neisseria Gonorrhea: NEGATIVE

## 2018-12-30 LAB — CULTURE, BLOOD (ROUTINE X 2)
Culture: NO GROWTH
Culture: NO GROWTH
Special Requests: ADEQUATE

## 2019-01-03 ENCOUNTER — Telehealth: Payer: Self-pay | Admitting: Pharmacist

## 2019-01-03 NOTE — Telephone Encounter (Signed)
Attempted call multiple times.  All calls immediately went to voice mail.   Left single message- asked patient to call and follow-up with me if needed. Provided my number.   I will plan to call him again in 1 week.

## 2019-01-09 ENCOUNTER — Telehealth: Payer: Self-pay | Admitting: Pharmacist

## 2019-01-09 NOTE — Telephone Encounter (Signed)
Attempted contact X2, left message that I would attempt follow-up again in a few days.

## 2019-01-13 ENCOUNTER — Ambulatory Visit: Payer: Medicaid Other | Admitting: Family Medicine

## 2019-01-14 ENCOUNTER — Other Ambulatory Visit: Payer: Self-pay

## 2019-01-14 DIAGNOSIS — E1140 Type 2 diabetes mellitus with diabetic neuropathy, unspecified: Secondary | ICD-10-CM

## 2019-01-14 MED ORDER — BD PEN NEEDLE NANO U/F 32G X 4 MM MISC: 1.0000 | Freq: Two times a day (BID) | 2 refills | Status: DC

## 2019-01-16 ENCOUNTER — Telehealth: Payer: Self-pay | Admitting: Pharmacist

## 2019-01-16 NOTE — Telephone Encounter (Signed)
-----   Message from Leavy Cella, [facility] sent at 01/09/2019  3:27 PM EDT ----- Regarding: Tobacco and DM

## 2019-01-16 NOTE — Telephone Encounter (Signed)
Attempted multiple times calls without success.   Plan to call him again in 10-14 days.

## 2019-02-03 ENCOUNTER — Ambulatory Visit: Payer: Medicaid Other | Admitting: Podiatry

## 2019-02-20 ENCOUNTER — Other Ambulatory Visit: Payer: Self-pay

## 2019-02-20 ENCOUNTER — Encounter (HOSPITAL_COMMUNITY): Payer: Self-pay | Admitting: Emergency Medicine

## 2019-02-20 ENCOUNTER — Emergency Department (HOSPITAL_COMMUNITY): Payer: Medicaid Other

## 2019-02-20 ENCOUNTER — Inpatient Hospital Stay (HOSPITAL_COMMUNITY)
Admission: EM | Admit: 2019-02-20 | Discharge: 2019-03-29 | DRG: 885 | Disposition: A | Discharge status: Nursing Home (Includes ICF) | Payer: Medicaid Other | Attending: Family Medicine | Admitting: Family Medicine

## 2019-02-20 DIAGNOSIS — F250 Schizoaffective disorder, bipolar type: Principal | ICD-10-CM | POA: Diagnosis present

## 2019-02-20 DIAGNOSIS — N401 Enlarged prostate with lower urinary tract symptoms: Secondary | ICD-10-CM | POA: Diagnosis present

## 2019-02-20 DIAGNOSIS — E1121 Type 2 diabetes mellitus with diabetic nephropathy: Secondary | ICD-10-CM

## 2019-02-20 DIAGNOSIS — F17210 Nicotine dependence, cigarettes, uncomplicated: Secondary | ICD-10-CM | POA: Diagnosis present

## 2019-02-20 DIAGNOSIS — Z91013 Allergy to seafood: Secondary | ICD-10-CM

## 2019-02-20 DIAGNOSIS — K5900 Constipation, unspecified: Secondary | ICD-10-CM | POA: Diagnosis present

## 2019-02-20 DIAGNOSIS — R443 Hallucinations, unspecified: Secondary | ICD-10-CM | POA: Diagnosis not present

## 2019-02-20 DIAGNOSIS — E875 Hyperkalemia: Secondary | ICD-10-CM | POA: Diagnosis not present

## 2019-02-20 DIAGNOSIS — N433 Hydrocele, unspecified: Secondary | ICD-10-CM | POA: Diagnosis present

## 2019-02-20 DIAGNOSIS — J4530 Mild persistent asthma, uncomplicated: Secondary | ICD-10-CM | POA: Diagnosis present

## 2019-02-20 DIAGNOSIS — K219 Gastro-esophageal reflux disease without esophagitis: Secondary | ICD-10-CM | POA: Diagnosis present

## 2019-02-20 DIAGNOSIS — N3946 Mixed incontinence: Secondary | ICD-10-CM | POA: Diagnosis present

## 2019-02-20 DIAGNOSIS — Z20828 Contact with and (suspected) exposure to other viral communicable diseases: Secondary | ICD-10-CM | POA: Diagnosis present

## 2019-02-20 DIAGNOSIS — N1831 Chronic kidney disease, stage 3a: Secondary | ICD-10-CM | POA: Diagnosis present

## 2019-02-20 DIAGNOSIS — N1832 Chronic kidney disease, stage 3b: Secondary | ICD-10-CM

## 2019-02-20 DIAGNOSIS — E119 Type 2 diabetes mellitus without complications: Secondary | ICD-10-CM

## 2019-02-20 DIAGNOSIS — N179 Acute kidney failure, unspecified: Secondary | ICD-10-CM | POA: Diagnosis present

## 2019-02-20 DIAGNOSIS — M1A9XX0 Chronic gout, unspecified, without tophus (tophi): Secondary | ICD-10-CM | POA: Diagnosis present

## 2019-02-20 DIAGNOSIS — F200 Paranoid schizophrenia: Secondary | ICD-10-CM | POA: Diagnosis not present

## 2019-02-20 DIAGNOSIS — I129 Hypertensive chronic kidney disease with stage 1 through stage 4 chronic kidney disease, or unspecified chronic kidney disease: Secondary | ICD-10-CM | POA: Diagnosis present

## 2019-02-20 DIAGNOSIS — E1142 Type 2 diabetes mellitus with diabetic polyneuropathy: Secondary | ICD-10-CM | POA: Diagnosis present

## 2019-02-20 DIAGNOSIS — N50819 Testicular pain, unspecified: Secondary | ICD-10-CM

## 2019-02-20 DIAGNOSIS — Z79899 Other long term (current) drug therapy: Secondary | ICD-10-CM

## 2019-02-20 DIAGNOSIS — Z96652 Presence of left artificial knee joint: Secondary | ICD-10-CM | POA: Diagnosis present

## 2019-02-20 DIAGNOSIS — E1165 Type 2 diabetes mellitus with hyperglycemia: Secondary | ICD-10-CM | POA: Diagnosis present

## 2019-02-20 DIAGNOSIS — F419 Anxiety disorder, unspecified: Secondary | ICD-10-CM | POA: Diagnosis present

## 2019-02-20 DIAGNOSIS — Z880 Allergy status to penicillin: Secondary | ICD-10-CM

## 2019-02-20 DIAGNOSIS — Z794 Long term (current) use of insulin: Secondary | ICD-10-CM

## 2019-02-20 DIAGNOSIS — Z888 Allergy status to other drugs, medicaments and biological substances status: Secondary | ICD-10-CM

## 2019-02-20 DIAGNOSIS — E785 Hyperlipidemia, unspecified: Secondary | ICD-10-CM | POA: Diagnosis present

## 2019-02-20 DIAGNOSIS — E1122 Type 2 diabetes mellitus with diabetic chronic kidney disease: Secondary | ICD-10-CM | POA: Diagnosis present

## 2019-02-20 LAB — CBC
HCT: 35.7 % — ABNORMAL LOW (ref 39.0–52.0)
HCT: 34.4 % — ABNORMAL LOW (ref 39.0–52.0)
Hemoglobin: 10.4 g/dL — ABNORMAL LOW (ref 13.0–17.0)
Hemoglobin: 10.7 g/dL — ABNORMAL LOW (ref 13.0–17.0)
MCH: 28 pg (ref 26.0–34.0)
MCH: 28.9 pg (ref 26.0–34.0)
MCHC: 29.1 g/dL — ABNORMAL LOW (ref 30.0–36.0)
MCHC: 31.1 g/dL (ref 30.0–36.0)
MCV: 96 fL (ref 80.0–100.0)
MCV: 93 fL (ref 80.0–100.0)
Platelets: 280 10*3/uL (ref 150–400)
Platelets: 269 10*3/uL (ref 150–400)
RBC: 3.72 MIL/uL — ABNORMAL LOW (ref 4.22–5.81)
RBC: 3.7 MIL/uL — ABNORMAL LOW (ref 4.22–5.81)
RDW: 14 % (ref 11.5–15.5)
RDW: 13.8 % (ref 11.5–15.5)
WBC: 10.6 10*3/uL — ABNORMAL HIGH (ref 4.0–10.5)
WBC: 9.3 10*3/uL (ref 4.0–10.5)
nRBC: 0 % (ref 0.0–0.2)
nRBC: 0 % (ref 0.0–0.2)

## 2019-02-20 LAB — POTASSIUM: Potassium: 6.1 mmol/L — ABNORMAL HIGH (ref 3.5–5.1)

## 2019-02-20 LAB — COMPREHENSIVE METABOLIC PANEL
ALT: 16 U/L (ref 0–44)
AST: 19 U/L (ref 15–41)
Albumin: 3 g/dL — ABNORMAL LOW (ref 3.5–5.0)
Alkaline Phosphatase: 60 U/L (ref 38–126)
Anion gap: 9 (ref 5–15)
BUN: 25 mg/dL — ABNORMAL HIGH (ref 8–23)
CO2: 21 mmol/L — ABNORMAL LOW (ref 22–32)
Calcium: 9.1 mg/dL (ref 8.9–10.3)
Chloride: 107 mmol/L (ref 98–111)
Creatinine, Ser: 1.94 mg/dL — ABNORMAL HIGH (ref 0.61–1.24)
GFR calc Af Amer: 42 mL/min — ABNORMAL LOW (ref >60)
GFR calc non Af Amer: 36 mL/min — ABNORMAL LOW (ref >60)
Glucose, Bld: 173 mg/dL — ABNORMAL HIGH (ref 70–99)
Potassium: 6.1 mmol/L — ABNORMAL HIGH (ref 3.5–5.1)
Sodium: 137 mmol/L (ref 135–145)
Total Bilirubin: 0.4 mg/dL (ref 0.3–1.2)
Total Protein: 7.2 g/dL (ref 6.5–8.1)

## 2019-02-20 LAB — URINALYSIS, ROUTINE W REFLEX MICROSCOPIC
Bilirubin Urine: NEGATIVE
Glucose, UA: NEGATIVE mg/dL
Hgb urine dipstick: NEGATIVE
Ketones, ur: NEGATIVE mg/dL
Leukocytes,Ua: NEGATIVE
Nitrite: NEGATIVE
Protein, ur: NEGATIVE mg/dL
Specific Gravity, Urine: 1.004 — ABNORMAL LOW (ref 1.005–1.030)
pH: 7 (ref 5.0–8.0)

## 2019-02-20 LAB — BASIC METABOLIC PANEL
Anion gap: 10 (ref 5–15)
BUN: 22 mg/dL (ref 8–23)
CO2: 20 mmol/L — ABNORMAL LOW (ref 22–32)
Calcium: 9 mg/dL (ref 8.9–10.3)
Chloride: 105 mmol/L (ref 98–111)
Creatinine, Ser: 1.73 mg/dL — ABNORMAL HIGH (ref 0.61–1.24)
GFR calc Af Amer: 48 mL/min — ABNORMAL LOW (ref >60)
GFR calc non Af Amer: 41 mL/min — ABNORMAL LOW (ref >60)
Glucose, Bld: 296 mg/dL — ABNORMAL HIGH (ref 70–99)
Potassium: 5.1 mmol/L (ref 3.5–5.1)
Sodium: 135 mmol/L (ref 135–145)

## 2019-02-20 LAB — AMMONIA: Ammonia: 22 umol/L (ref 9–35)

## 2019-02-20 LAB — RAPID URINE DRUG SCREEN, HOSP PERFORMED
Amphetamines: NOT DETECTED
Barbiturates: NOT DETECTED
Benzodiazepines: NOT DETECTED
Cocaine: NOT DETECTED
Opiates: NOT DETECTED
Tetrahydrocannabinol: NOT DETECTED

## 2019-02-20 LAB — HEMOGLOBIN A1C
Hgb A1c MFr Bld: 8.9 % — ABNORMAL HIGH (ref 4.8–5.6)
Mean Plasma Glucose: 208.73 mg/dL

## 2019-02-20 LAB — LITHIUM LEVEL: Lithium Lvl: 1.21 mmol/L — ABNORMAL HIGH (ref 0.60–1.20)

## 2019-02-20 LAB — CBG MONITORING, ED: Glucose-Capillary: 243 mg/dL — ABNORMAL HIGH (ref 70–99)

## 2019-02-20 LAB — ACETAMINOPHEN LEVEL: Acetaminophen (Tylenol), Serum: <10 ug/mL — ABNORMAL LOW (ref 10–30)

## 2019-02-20 LAB — ETHANOL: Alcohol, Ethyl (B): <10 mg/dL (ref <10)

## 2019-02-20 LAB — SALICYLATE LEVEL: Salicylate Lvl: <7 mg/dL (ref 2.8–30.0)

## 2019-02-20 LAB — VALPROIC ACID LEVEL: Valproic Acid Lvl: 72 ug/mL (ref 50.0–100.0)

## 2019-02-20 MED ORDER — AMLODIPINE BESYLATE 10 MG PO TABS
10.0000 mg | ORAL_TABLET | Freq: Every day | ORAL | Status: DC
  Administered 2019-02-21 – 2019-03-29 (×37): 10 mg via ORAL
  Filled 2019-02-20 (×7): qty 1
  Filled 2019-02-20: qty 2
  Filled 2019-02-20 (×30): qty 1

## 2019-02-20 MED ORDER — PANTOPRAZOLE SODIUM 40 MG PO TBEC
40.0000 mg | DELAYED_RELEASE_TABLET | Freq: Every day | ORAL | Status: DC
  Administered 2019-02-21 – 2019-03-09 (×17): 40 mg via ORAL
  Filled 2019-02-20 (×18): qty 1

## 2019-02-20 MED ORDER — GENERIC EXTERNAL MEDICATION
Status: DC
Start: ? — End: 2019-02-20

## 2019-02-20 MED ORDER — CLONIDINE HCL 0.1 MG PO TABS
0.10 | ORAL_TABLET | ORAL | Status: DC
Start: ? — End: 2019-02-20

## 2019-02-20 MED ORDER — INSULIN LISPRO 100 UNIT/ML ~~LOC~~ SOLN
10.00 | SUBCUTANEOUS | Status: DC
Start: 2019-02-19 — End: 2019-02-20

## 2019-02-20 MED ORDER — DIVALPROEX SODIUM 500 MG PO DR TAB
2000.0000 mg | DELAYED_RELEASE_TABLET | Freq: Every evening | ORAL | Status: DC
  Administered 2019-02-21 – 2019-03-28 (×38): 2000 mg via ORAL
  Filled 2019-02-20 (×39): qty 4

## 2019-02-20 MED ORDER — BENZTROPINE MESYLATE 1 MG PO TABS
1.00 | ORAL_TABLET | ORAL | Status: DC
Start: ? — End: 2019-02-20

## 2019-02-20 MED ORDER — ACETAMINOPHEN 325 MG PO TABS
650.0000 mg | ORAL_TABLET | Freq: Four times a day (QID) | ORAL | Status: DC | PRN
  Administered 2019-03-05 – 2019-03-13 (×2): 650 mg via ORAL
  Filled 2019-02-20 (×2): qty 2

## 2019-02-20 MED ORDER — AMLODIPINE BESYLATE 10 MG PO TABS
10.00 | ORAL_TABLET | ORAL | Status: DC
Start: 2019-02-20 — End: 2019-02-20

## 2019-02-20 MED ORDER — POLYETHYLENE GLYCOL 3350 17 G PO PACK
17.00 | PACK | ORAL | Status: DC
Start: ? — End: 2019-02-20

## 2019-02-20 MED ORDER — DIVALPROEX SODIUM ER 500 MG PO TB24
2000.00 | ORAL_TABLET | ORAL | Status: DC
Start: 2019-02-19 — End: 2019-02-20

## 2019-02-20 MED ORDER — FINASTERIDE 5 MG PO TABS
5.0000 mg | ORAL_TABLET | Freq: Every day | ORAL | Status: DC
  Administered 2019-02-21 – 2019-03-29 (×37): 5 mg via ORAL
  Filled 2019-02-20 (×37): qty 1

## 2019-02-20 MED ORDER — GABAPENTIN 300 MG PO CAPS
600.0000 mg | ORAL_CAPSULE | Freq: Three times a day (TID) | ORAL | Status: DC
  Administered 2019-02-21 – 2019-03-29 (×108): 600 mg via ORAL
  Filled 2019-02-20 (×110): qty 2

## 2019-02-20 MED ORDER — ENOXAPARIN SODIUM 40 MG/0.4ML ~~LOC~~ SOLN
40.0000 mg | SUBCUTANEOUS | Status: DC
  Administered 2019-02-21: 40 mg via SUBCUTANEOUS
  Filled 2019-02-20: qty 0.4

## 2019-02-20 MED ORDER — DIPHENHYDRAMINE HCL 50 MG/ML IJ SOLN
25.00 | INTRAMUSCULAR | Status: DC
Start: ? — End: 2019-02-20

## 2019-02-20 MED ORDER — SODIUM CHLORIDE 0.9 % IV BOLUS
1000.0000 mL | Freq: Once | INTRAVENOUS | Status: AC
  Administered 2019-02-20: 1000 mL via INTRAVENOUS

## 2019-02-20 MED ORDER — TAMSULOSIN HCL 0.4 MG PO CAPS
0.40 | ORAL_CAPSULE | ORAL | Status: DC
Start: 2019-02-20 — End: 2019-02-20

## 2019-02-20 MED ORDER — LITHIUM CARBONATE 300 MG PO CAPS
300.0000 mg | ORAL_CAPSULE | Freq: Two times a day (BID) | ORAL | Status: DC
  Administered 2019-02-21 – 2019-03-27 (×67): 300 mg via ORAL
  Filled 2019-02-20 (×72): qty 1

## 2019-02-20 MED ORDER — BISACODYL 5 MG PO TBEC
5.00 | DELAYED_RELEASE_TABLET | ORAL | Status: DC
Start: ? — End: 2019-02-20

## 2019-02-20 MED ORDER — ALBUTEROL SULFATE (2.5 MG/3ML) 0.083% IN NEBU: 3.0000 mL | INHALATION_SOLUTION | Freq: Four times a day (QID) | RESPIRATORY_TRACT | Status: DC | PRN

## 2019-02-20 MED ORDER — ALBUTEROL SULFATE HFA 108 (90 BASE) MCG/ACT IN AERS
2.00 | INHALATION_SPRAY | RESPIRATORY_TRACT | Status: DC
Start: ? — End: 2019-02-20

## 2019-02-20 MED ORDER — FAMOTIDINE 20 MG PO TABS
20.00 | ORAL_TABLET | ORAL | Status: DC
Start: 2019-02-19 — End: 2019-02-20

## 2019-02-20 MED ORDER — ACETAMINOPHEN 650 MG RE SUPP: 650.0000 mg | Freq: Four times a day (QID) | RECTAL | Status: DC | PRN

## 2019-02-20 MED ORDER — FINASTERIDE 5 MG PO TABS
5.00 | ORAL_TABLET | ORAL | Status: DC
Start: 2019-02-19 — End: 2019-02-20

## 2019-02-20 MED ORDER — BENZTROPINE MESYLATE 1 MG PO TABS
2.00 | ORAL_TABLET | ORAL | Status: DC
Start: 2019-02-19 — End: 2019-02-20

## 2019-02-20 MED ORDER — ALLOPURINOL 100 MG PO TABS
200.0000 mg | ORAL_TABLET | Freq: Every day | ORAL | Status: DC
  Administered 2019-02-21 – 2019-03-29 (×35): 200 mg via ORAL
  Filled 2019-02-20 (×36): qty 2

## 2019-02-20 MED ORDER — POLYETHYLENE GLYCOL 3350 17 G PO PACK
17.00 | PACK | ORAL | Status: DC
Start: 2019-02-20 — End: 2019-02-20

## 2019-02-20 MED ORDER — FAMOTIDINE 20 MG PO TABS
20.0000 mg | ORAL_TABLET | Freq: Two times a day (BID) | ORAL | Status: DC
  Administered 2019-02-21 – 2019-03-29 (×71): 20 mg via ORAL
  Filled 2019-02-20 (×73): qty 1

## 2019-02-20 MED ORDER — ATORVASTATIN CALCIUM 40 MG PO TABS
20.00 | ORAL_TABLET | ORAL | Status: DC
Start: 2019-02-19 — End: 2019-02-20

## 2019-02-20 MED ORDER — DICLOFENAC SODIUM 1 % TD GEL
4.00 | TRANSDERMAL | Status: DC
Start: ? — End: 2019-02-20

## 2019-02-20 MED ORDER — HYDROXYZINE HCL 25 MG PO TABS
50.0000 mg | ORAL_TABLET | Freq: Four times a day (QID) | ORAL | Status: DC | PRN
  Administered 2019-02-21: 50 mg via ORAL
  Filled 2019-02-20 (×2): qty 2

## 2019-02-20 MED ORDER — INSULIN ASPART 100 UNIT/ML ~~LOC~~ SOLN: 0.0000 [IU] | Freq: Three times a day (TID) | SUBCUTANEOUS | Status: DC

## 2019-02-20 MED ORDER — DIVALPROEX SODIUM 500 MG PO DR TAB
500.0000 mg | DELAYED_RELEASE_TABLET | Freq: Every morning | ORAL | Status: DC
  Administered 2019-02-21 – 2019-03-29 (×37): 500 mg via ORAL
  Filled 2019-02-20 (×37): qty 1

## 2019-02-20 MED ORDER — NITROGLYCERIN 0.4 MG SL SUBL
0.40 | SUBLINGUAL_TABLET | SUBLINGUAL | Status: DC
Start: ? — End: 2019-02-20

## 2019-02-20 MED ORDER — ACETAMINOPHEN 325 MG PO TABS
650.00 | ORAL_TABLET | ORAL | Status: DC
Start: ? — End: 2019-02-20

## 2019-02-20 MED ORDER — INSULIN LISPRO 100 UNIT/ML ~~LOC~~ SOLN
0.00 | SUBCUTANEOUS | Status: DC
Start: 2019-02-19 — End: 2019-02-20

## 2019-02-20 MED ORDER — PANTOPRAZOLE SODIUM 40 MG PO TBEC
40.00 | DELAYED_RELEASE_TABLET | ORAL | Status: DC
Start: 2019-02-20 — End: 2019-02-20

## 2019-02-20 MED ORDER — INSULIN GLARGINE 100 UNIT/ML ~~LOC~~ SOLN
45.00 | SUBCUTANEOUS | Status: DC
Start: 2019-02-19 — End: 2019-02-20

## 2019-02-20 MED ORDER — TAMSULOSIN HCL 0.4 MG PO CAPS
0.4000 mg | ORAL_CAPSULE | Freq: Every day | ORAL | Status: DC
  Administered 2019-02-22 – 2019-03-29 (×35): 0.4 mg via ORAL
  Filled 2019-02-20 (×38): qty 1

## 2019-02-20 MED ORDER — ALLOPURINOL 100 MG PO TABS
100.00 | ORAL_TABLET | ORAL | Status: DC
Start: 2019-02-20 — End: 2019-02-20

## 2019-02-20 MED ORDER — DIVALPROEX SODIUM ER 500 MG PO TB24
500.00 | ORAL_TABLET | ORAL | Status: DC
Start: 2019-02-20 — End: 2019-02-20

## 2019-02-20 MED ORDER — PROMETHAZINE HCL 25 MG/ML IJ SOLN
12.50 | INTRAMUSCULAR | Status: DC
Start: ? — End: 2019-02-20

## 2019-02-20 MED ORDER — LOPERAMIDE HCL 2 MG PO CAPS
2.00 | ORAL_CAPSULE | ORAL | Status: DC
Start: ? — End: 2019-02-20

## 2019-02-20 MED ORDER — BENZTROPINE MESYLATE 2 MG PO TABS
2.0000 mg | ORAL_TABLET | Freq: Two times a day (BID) | ORAL | Status: DC
  Administered 2019-02-21: 2 mg via ORAL
  Filled 2019-02-20 (×3): qty 1

## 2019-02-20 MED ORDER — BENZOCAINE 10 % MT GEL
OROMUCOSAL | Status: DC
Start: ? — End: 2019-02-20

## 2019-02-20 MED ORDER — ALUM & MAG HYDROXIDE-SIMETH 200-200-20 MG/5ML PO SUSP
30.00 | ORAL | Status: DC
Start: ? — End: 2019-02-20

## 2019-02-20 MED ORDER — GABAPENTIN 300 MG PO CAPS
600.00 | ORAL_CAPSULE | ORAL | Status: DC
Start: 2019-02-19 — End: 2019-02-20

## 2019-02-20 MED ORDER — SODIUM ZIRCONIUM CYCLOSILICATE 10 G PO PACK
10.0000 g | PACK | Freq: Once | ORAL | Status: AC
  Administered 2019-02-20: 10 g via ORAL
  Filled 2019-02-20: qty 1

## 2019-02-20 MED ORDER — HYDROXYZINE PAMOATE 50 MG PO CAPS
50.00 | ORAL_CAPSULE | ORAL | Status: DC
Start: ? — End: 2019-02-20

## 2019-02-20 MED ORDER — DIPHENHYDRAMINE HCL 50 MG PO CAPS
50.00 | ORAL_CAPSULE | ORAL | Status: DC
Start: ? — End: 2019-02-20

## 2019-02-20 MED ORDER — POLYETHYLENE GLYCOL 3350 17 G PO PACK: 17.0000 g | PACK | Freq: Every day | ORAL | Status: DC | PRN

## 2019-02-20 MED ORDER — LITHIUM CARBONATE ER 300 MG PO TBCR
300.00 | EXTENDED_RELEASE_TABLET | ORAL | Status: DC
Start: 2019-02-19 — End: 2019-02-20

## 2019-02-20 NOTE — BH Assessment (Signed)
Tele Assessment Note   Patient Name: Dylan Jordan MRN: [id number] Referring Physician: Dr. Particia Nearing Location of Patient: [facility] Location of Provider: Behavioral Health TTS Department  Dylan Jordan is an 63 y.o. male. Pt's speech was slurred and garbled.  This Clinical research associate obtained information from the Pt's sister-Gale McCray and niece-Shelia Johnson. Mrs. McCray and Mrs. Johnson the Pt has been triving the past 10 years but a recent medication change has caused the Pt to decompensate. Per Mrs. Laural Benes she picked the Pt up from the hospital yesterday and the Pt began seeing seals in the road and tried to grab her steering wheel. The Pt's speech became garbled, his AVH increased, and he became combative and agitated. Mrs. Laural Benes is in the control of the Pt's finances. Mrs. Laural Benes and Mrs. Constance Haw are seeking guardianship.   Garlan Fillers, NP recommends inpatient treatment and med evaluation.   Diagnosis:  F20.9 Schizophrenia  Past Medical History:  Past Medical History:  Diagnosis Date  . Anemia   . Asthma   . Diabetes mellitus without complication ([facility])   . Gout   . Hyperlipidemia   . Hyperosmolar hyperglycemic coma due to diabetes mellitus without ketoacidosis ([facility])   . Hypertension   . Schizophrenia ([facility]) 07/06/2018    Past Surgical History:  Procedure Laterality Date  . REPLACEMENT TOTAL KNEE Left   . TOE SURGERY      Family History:  Family History  Adopted: Yes  Problem Relation Age of Onset  . Allergic rhinitis Neg Hx   . Angioedema Neg Hx   . Asthma Neg Hx   . Atopy Neg Hx   . Eczema Neg Hx   . Immunodeficiency Neg Hx   . Urticaria Neg Hx     Social History:  reports that he has been smoking cigarettes. He started smoking about 47 years ago. He has a 13.50 pack-year smoking history. He has never used smokeless tobacco. He reports that he does not drink alcohol or use drugs.  Additional Social History:  Alcohol / Drug Use Pain Medications: please see mar Prescriptions:  please see mar Over the Counter: please see mar History of alcohol / drug use?: No history of alcohol / drug abuse Longest period of sobriety (when/how long): NA  CIWA: CIWA-Ar BP: 123/78 Pulse Rate: 78 COWS:    Allergies:  Allergies  Allergen Reactions  . Chantix [Varenicline Tartrate] Other (See Comments)    Nightmares  . Ativan [Lorazepam] Other (See Comments)    Agitation (and "5 others like it") Will need to call [facility] [place] (phone) [phone] and [phone] to fax a release-   . Shellfish-Derived Products Itching  . Penicillins Diarrhea    Has patient had a PCN reaction causing immediate rash, facial/tongue/throat swelling, SOB or lightheadedness with hypotension: No Has patient had a PCN reaction causing severe rash involving mucus membranes or skin necrosis: No Has patient had a PCN reaction that required hospitalization: No Has patient had a PCN reaction occurring within the last 10 years: No If all of the above answers are "NO", then may proceed with Cephalosporin use.     Home Medications: (Not in a hospital admission)   OB/GYN Status:  No LMP for male patient.  General Assessment Data Location of Assessment: [facility] ED TTS Assessment: In system Is this a Tele or Face-to-Face Assessment?: Tele Assessment Is this an Initial Assessment or a Re-assessment for this encounter?: Initial Assessment Patient Accompanied by:: N/A Language Other than English: No Living Arrangements: Other (Comment) What  gender do you identify as?: Male Marital status: Single Maiden name: NA Pregnancy Status: No Living Arrangements: Alone Can pt return to current living arrangement?: Yes Admission Status: Voluntary Is patient capable of signing voluntary admission?: Yes Referral Source: Self/Family/Friend Insurance type: Medicaid     Crisis Care Plan Living Arrangements: Alone Legal Guardian: Other:(NA) Name of Psychiatrist: [name]. Name of  Therapist: Annia FriendlyChester.   Education Status Is patient currently in school?: No Is the patient employed, unemployed or receiving disability?: Unemployed  Risk to self with the past 6 months Suicidal Ideation: No Has patient been a risk to self within the past 6 months prior to admission? : No Suicidal Intent: No Has patient had any suicidal intent within the past 6 months prior to admission? : No Is patient at risk for suicide?: No Suicidal Plan?: No Has patient had any suicidal plan within the past 6 months prior to admission? : No Access to Means: No What has been your use of drugs/alcohol within the last 12 months?: NA Previous Attempts/Gestures: No How many times?: 0 Other Self Harm Risks: NA Triggers for Past Attempts: None known Intentional Self Injurious Behavior: None Family Suicide History: No Recent stressful life event(s): Other (Comment) Persecutory voices/beliefs?: No Depression: No Depression Symptoms: (Pt denies) Substance abuse history and/or treatment for substance abuse?: No Suicide prevention information given to non-admitted patients: Not applicable  Risk to Others within the past 6 months Homicidal Ideation: No Does patient have any lifetime risk of violence toward others beyond the six months prior to admission? : No Thoughts of Harm to Others: No Current Homicidal Intent: No Current Homicidal Plan: No Access to Homicidal Means: No Identified Victim: NA History of harm to others?: No Assessment of Violence: None Noted Violent Behavior Description: NA Does patient have access to weapons?: No Criminal Charges Pending?: No Does patient have a court date: No Is patient on probation?: No  Psychosis Hallucinations: Visual Delusions: Unspecified  Mental Status Report Appearance/Hygiene: In scrubs Eye Contact: Poor Motor Activity: Unremarkable Speech: Slow, Slurred Level of Consciousness: Drowsy Mood: Anxious Affect: Anxious Anxiety Level:  None Thought Processes: Tangential Judgement: Impaired Orientation: Not oriented Obsessive Compulsive Thoughts/Behaviors: None  Cognitive Functioning Memory: Recent Impaired, Remote Impaired Is patient IDD: No Insight: Poor Impulse Control: Poor Appetite: Poor Have you had any weight changes? : No Change Sleep: No Change Total Hours of Sleep: 8 Vegetative Symptoms: None  ADLScreening [facility] Assessment Services) Patient's cognitive ability adequate to safely complete daily activities?: Yes Patient able to express need for assistance with ADLs?: Yes Independently performs ADLs?: Yes (appropriate for developmental age)  Prior Inpatient Therapy Prior Inpatient Therapy: Yes Prior Therapy Dates: 2020 [facility] Reason for Treatment: Schiziphrenia  Prior Outpatient Therapy Prior Outpatient Therapy: Yes Prior Therapy Dates: Current. Prior Therapy Facilty/Provider(s): [name].  Reason for Treatment: Medication management and counseling.  Does patient have an ACCT team?: No Does patient have Intensive In-House Services?  : No Does patient have [name] services? : Yes Does patient have P4CC services?: No  ADL Screening (condition at time of admission) Patient's cognitive ability adequate to safely complete daily activities?: Yes Does the patient have difficulty seeing, even when wearing glasses/contacts?: No Does the patient have difficulty concentrating, remembering, or making decisions?: No Patient able to express need for assistance with ADLs?: Yes Does the patient have difficulty dressing or bathing?: No Independently performs ADLs?: Yes (appropriate for developmental age)       Abuse/Neglect Assessment (Assessment to be complete  while patient is alone) Abuse/Neglect Assessment Can Be Completed: Yes Physical Abuse: Denies Verbal Abuse: Denies Sexual Abuse: Denies Exploitation of patient/patient's resources: Denies     Advance  Directives (For Healthcare) Does Patient Have a Medical Advance Directive?: No Would patient like information on creating a medical advance directive?: No - Patient declined          Disposition:  Disposition Initial Assessment Completed for this Encounter: Yes  This service was provided via telemedicine using a 2-way, interactive audio and video technology.  Names of all persons participating in this telemedicine service and their role in this encounter. Name: Lebron Quam Role: niece  Name: Heron Sabins Role: sister  Name:  Role:   Name:  Role:     Cyndia Bent 02/20/2019 5:12 PM

## 2019-02-20 NOTE — ED Notes (Signed)
TTS called asking to be notified of pt room number when bed has been assigned. [phone]

## 2019-02-20 NOTE — ED Notes (Signed)
Pt. Used bathroom on himself, will wait until pt. Has to void again to collect UA

## 2019-02-20 NOTE — ED Notes (Signed)
Pt. Sister is in the room and wanting to speak with RN

## 2019-02-20 NOTE — ED Triage Notes (Signed)
Pt reports hallucinations for the past 2 months and had a recent hospital stay. Pt reports his symptoms have not gone away.

## 2019-02-20 NOTE — BH Assessment (Signed)
Tinnie Gens, NP recommends inpatient treatment. Patient referred to the following hospitals. Pending review:    [facility] Details      [facility] Details      [facility] Details      [facility] Details      [facility] Details      [facility] Details      [facility] Details      [facility] Details      [facility] Details      [facility] Details      [facility] Details      [facility] Details      [place] Details      [facility] Details      [place] Details      [facility] Details      [facility] Details      [facility] Details      [facility] Details      [facility]

## 2019-02-20 NOTE — ED Notes (Signed)
Pt. To CT

## 2019-02-20 NOTE — ED Provider Notes (Signed)
Care handoff received from Providence Lanius, PA-C at shift change please see her note for further details.    In short 63 year old male presents today for hallucinations, history of schizophrenia, hyperlipidemia, diabetes and gout.  Patient recently discharged from [facility].  Increasing paranoia and hallucinations, abnormal behaviors.  Lithium 1.21 Potassium 6.1 Ammonia within normal limits Tylenol negative Salicylate negative CBG 243 Ethanol negative CBC with WBCs 10.6, hemoglobin 10.4 CMP potassium 6.1, creatinine 1.94 EKG reviewed by Dr. Roderic Palau without acute findings - Plan of care at shift handoff was to await CT head then call for admission to family medicine service for hyperkalemia.  Patient has received fluid bolus and Lokelma.  Unclear etiology of hyperkalemia. Physical Exam  BP 123/78   Pulse 78   Temp 98.2 F (36.8 C) (Oral)   Resp (!) 23   Ht 6\' 1"  (1.854 m)   Wt 113.1 kg   SpO2 96%   BMI 32.90 kg/m   Physical Exam Constitutional:      General: He is not in acute distress.    Appearance: Normal appearance. He is well-developed. He is not ill-appearing or diaphoretic.  HENT:     Head: Normocephalic and atraumatic.     Right Ear: External ear normal.     Left Ear: External ear normal.     Nose: Nose normal.  Neck:     Trachea: Trachea and phonation normal. No tracheal deviation.  Pulmonary:     Effort: Pulmonary effort is normal. No respiratory distress.  Skin:    General: Skin is warm and dry.  Neurological:     Comments: Sleeping comfortably     ED Course/Procedures   Clinical Course as of Feb 19 1801  Thu Feb 20, 2019  1633 Plan: Await CT head; Admit for hyperkalemia and hallucinations; obs admission to family medicine.   [BM]  [place]   [BM]    Clinical Course User Index [BM] Deliah Boston, PA-C    Procedures  MDM  CT Head:  IMPRESSION:  1. No acute intracranial pathology.   Consult placed to family medicine for  admission. - Patient evaluated sleeping comfortably with sitter at bedside.  On monitor, vital signs stable. - Discussed case with family medicine who will be seeing patient for admission.  Psych consult is placed and pending at this time. - Patient has been admitted to family medicine service for further evaluation and treatment.  Note: Portions of this report may have been transcribed using voice recognition software. Every effort was made to ensure accuracy; however, inadvertent computerized transcription errors may still be present.   Gari Crown 02/20/19 Joycelyn Das, MD 02/20/19 [phone]

## 2019-02-20 NOTE — ED Provider Notes (Addendum)
[facility] EMERGENCY DEPARTMENT Provider Note   CSN: [id number] Arrival date & time: 02/20/19  1038     History   Chief Complaint Chief Complaint  Patient presents with   Hallucinations    HPI Dylan Jordan is a 63 y.o. male with PMH/o DM, Gout, HLD, Schizophrenia who presents for evaluation of hallucinations.  EM LEVEL 5 CAVEAT DUE TO HALLUCINATIONS        Past Medical History:  Diagnosis Date   Anemia    Asthma    Diabetes mellitus without complication ([place])    Gout    Hyperlipidemia    Hyperosmolar hyperglycemic coma due to diabetes mellitus without ketoacidosis ([place])    Hypertension    Schizophrenia ([place]) 07/06/2018    Patient Active Problem List   Diagnosis Date Noted   Schizoaffective disorder, bipolar type ([place]) 02/21/2019   Hallucinations    Anxiety 09/18/2018   Swelling of both lower extremities 09/13/2018   Type 2 diabetes mellitus with diabetic chronic kidney disease ([place])    Anemia    Hyperlipidemia 12/18/2017   Mixed stress and urge urinary incontinence 11/25/2017   Intermittent chest pain 11/25/2017   Allergic rhinitis with a nonallergic component 07/30/2017   Food allergy 07/30/2017   Chronic gout involving toe of left foot without tophus 05/18/2017   Hemorrhoids 05/04/2017   Haloperidol adverse reaction 04/16/2017   Chronic low back pain 01/03/2017   Chronic cough 06/30/2016   Drug-induced mood disorder ([place]) 06/14/2016   Type 2 diabetes mellitus with diabetic neuropathy ([place]) 05/26/2016   Iron deficiency anemia 05/26/2016   Chronic kidney disease 05/26/2016   HTN (hypertension) 05/26/2016   Tobacco abuse 05/26/2016   History of substance abuse ([place]) 05/26/2016   History of alcohol abuse 05/26/2016   Mild persistent asthma 05/26/2016    Past Surgical History:  Procedure Laterality Date   REPLACEMENT TOTAL KNEE Left    TOE SURGERY          Home Medications    Prior to  Admission medications   Medication Sig Start Date End Date Taking? Authorizing Provider  Accu-Chek Softclix Lancets lancets 1 each by Other route 3 (three) times daily. 09/23/18  Yes [name], [name], DO  albuterol (PROVENTIL HFA;VENTOLIN HFA) 108 (90 Base) MCG/ACT inhaler Inhale 1-2 puffs into the lungs every 6 (six) hours as needed for wheezing or shortness of breath. 03/25/18  Yes Bobbitt, [place] Muta, MD  allopurinol (ZYLOPRIM) 100 MG tablet Take 2 tablets (200 mg total) by mouth daily. 01/10/18  Yes Hensel, Jamal Collin, MD  amLODipine (NORVASC) 10 MG tablet Take 10 mg by mouth daily.   Yes [provider]  benztropine (COGENTIN) 2 MG tablet Take 2 mg by mouth 2 (two) times daily.   Yes [provider]  Blood Glucose Monitoring Suppl (ACCU-CHEK AVIVA PLUS) w/Device KIT Use to check blood sugar three times daily or as directed. 09/23/18  Yes [name], [name], DO  divalproex (DEPAKOTE) 500 MG DR tablet Take 500-2,000 mg by mouth See admin instructions. Take one tablet by mouth every morning, then take 4 tablets by mouth at bedtime per family member   Yes [provider]  famotidine (PEPCID) 20 MG tablet Take 20 mg by mouth 2 (two) times daily.   Yes [provider]  finasteride (PROSCAR) 5 MG tablet Take 1 tablet (5 mg total) by mouth daily. 08/08/18  Yes [name], [name], DO  gabapentin (NEURONTIN) 300 MG capsule TAKE 1 CAPSULE BY MOUTH EVERY MORNING AND  2 CAPSULES AT BEDTIME Patient taking differently: Take 600 mg by mouth 3 (three) times daily.  12/09/18  Yes Enid Derry, [place], DO  hydrOXYzine (ATARAX/VISTARIL) 50 MG tablet Take 50 mg by mouth every 6 (six) hours as needed for anxiety.   Yes [provider]  Insulin Glargine (LANTUS SOLOSTAR) 100 UNIT/ML Solostar Pen Inject 25 Units into the skin daily. Patient taking differently: Inject 45 Units into the skin daily.  08/14/18  Yes Hensel, Jamal Collin, MD  insulin lispro (HUMALOG) 100 UNIT/ML injection Inject 10  Units into the skin 3 (three) times daily before meals.   Yes [provider]  lithium carbonate 300 MG capsule Take 300 mg by mouth 2 (two) times daily with a meal.   Yes [provider]  NOVOFINE PLUS 32G X 4 MM MISC 1 Container by Does not apply route daily. 05/18/18  Yes [provider]  pantoprazole (PROTONIX) 40 MG tablet Take 40 mg by mouth daily.   Yes [provider]  tamsulosin (FLOMAX) 0.4 MG CAPS capsule Take 2 capsules (0.8 mg total) by mouth daily. Patient taking differently: Take 0.4 mg by mouth daily.  07/11/18  Yes Wilber Oliphant, MD  glucose blood (ACCU-CHEK AVIVA PLUS) test strip Dispense QS for twice daily testing. 09/23/18   [name], DO  Insulin Pen Needle (BD PEN NEEDLE NANO U/F) 32G X 4 MM MISC Inject 1 applicator as directed 2 (two) times daily. 01/14/19   Shirley, [place], DO    Family History Family History  Adopted: Yes  Problem Relation Age of Onset   Allergic rhinitis Neg Hx    Angioedema Neg Hx    Asthma Neg Hx    Atopy Neg Hx    Eczema Neg Hx    Immunodeficiency Neg Hx    Urticaria Neg Hx     Social History Social History   Tobacco Use   Smoking status: Current Some Day Smoker    Packs/day: 0.30    Years: 45.00    Pack years: 13.50    Types: Cigarettes    Start date: 05/16/1971   Smokeless tobacco: Never Used   Tobacco comment: Decreased Intake to 10 cigarettes per week or less.  Substance Use Topics   Alcohol use: No   Drug use: No     Allergies   Chantix [varenicline tartrate], Ativan [lorazepam], Shellfish-derived products, and Penicillins   Review of Systems Review of Systems  Unable to perform ROS: Mental status change     Physical Exam Updated Vital Signs BP 124/83 (BP Location: Right Arm)    Pulse 92    Temp 98.8 F (37.1 C) (Oral)    Resp 16    Ht '6\' 1"'  (1.854 m)    Wt 111 kg    SpO2 99%    BMI 32.29 kg/m   Physical Exam Vitals signs and nursing note reviewed.   Constitutional:      Appearance: Normal appearance. He is well-developed.  HENT:     Head: Normocephalic and atraumatic.  Eyes:     General: Lids are normal.     Conjunctiva/sclera: Conjunctivae normal.     Pupils: Pupils are equal, round, and reactive to light.     Comments: PERRL.   Neck:     Musculoskeletal: Full passive range of motion without pain.  Cardiovascular:     Rate and Rhythm: Normal rate and regular rhythm.     Pulses: Normal pulses.     Heart sounds: Normal heart sounds. No  murmur. No friction rub. No gallop.   Pulmonary:     Effort: Pulmonary effort is normal.     Breath sounds: Normal breath sounds.     Comments: Lungs clear to auscultation bilaterally.  Symmetric chest rise.  No wheezing, rales, rhonchi. Abdominal:     Palpations: Abdomen is soft. Abdomen is not rigid.     Tenderness: There is no abdominal tenderness. There is no guarding.  Musculoskeletal: Normal range of motion.  Skin:    General: Skin is warm and dry.     Capillary Refill: Capillary refill takes less than 2 seconds.  Neurological:     Mental Status: He is alert.     Comments: Follows commands Moves all extremities spontaneously Mumbles whenever asked, questions and is not speaking coherently.   Psychiatric:        Speech: Speech normal.      ED Treatments / Results  Labs (all labs ordered are listed, but only abnormal results are displayed) Labs Reviewed  COMPREHENSIVE METABOLIC PANEL - Abnormal; Notable for the following components:      Result Value   Potassium 6.1 (*)    CO2 21 (*)    Glucose, Bld 173 (*)    BUN 25 (*)    Creatinine, Ser 1.94 (*)    Albumin 3.0 (*)    GFR calc non Af Amer 36 (*)    GFR calc Af Amer 42 (*)    All other components within normal limits  ACETAMINOPHEN LEVEL - Abnormal; Notable for the following components:   Acetaminophen (Tylenol), Serum <10 (*)    All other components within normal limits  CBC - Abnormal; Notable for the following  components:   WBC 10.6 (*)    RBC 3.72 (*)    Hemoglobin 10.4 (*)    HCT 35.7 (*)    MCHC 29.1 (*)    All other components within normal limits  POTASSIUM - Abnormal; Notable for the following components:   Potassium 6.1 (*)    All other components within normal limits  URINALYSIS, ROUTINE W REFLEX MICROSCOPIC - Abnormal; Notable for the following components:   Color, Urine STRAW (*)    Specific Gravity, Urine 1.004 (*)    All other components within normal limits  LITHIUM LEVEL - Abnormal; Notable for the following components:   Lithium Lvl 1.21 (*)    All other components within normal limits  CBC - Abnormal; Notable for the following components:   RBC 3.70 (*)    Hemoglobin 10.7 (*)    HCT 34.4 (*)    All other components within normal limits  BASIC METABOLIC PANEL - Abnormal; Notable for the following components:   CO2 20 (*)    Glucose, Bld 296 (*)    Creatinine, Ser 1.73 (*)    GFR calc non Af Amer 41 (*)    GFR calc Af Amer 48 (*)    All other components within normal limits  HEMOGLOBIN A1C - Abnormal; Notable for the following components:   Hgb A1c MFr Bld 8.9 (*)    All other components within normal limits  GLUCOSE, CAPILLARY - Abnormal; Notable for the following components:   Glucose-Capillary 255 (*)    All other components within normal limits  BASIC METABOLIC PANEL - Abnormal; Notable for the following components:   Potassium 5.4 (*)    Chloride 114 (*)    CO2 21 (*)    Glucose, Bld 142 (*)    Creatinine, Ser 1.51 (*)  GFR calc non Af Amer 49 (*)    GFR calc Af Amer 57 (*)    All other components within normal limits  BASIC METABOLIC PANEL - Abnormal; Notable for the following components:   Sodium 146 (*)    Chloride 114 (*)    Glucose, Bld 176 (*)    Creatinine, Ser 1.58 (*)    GFR calc non Af Amer 46 (*)    GFR calc Af Amer 54 (*)    All other components within normal limits  GLUCOSE, CAPILLARY - Abnormal; Notable for the following components:    Glucose-Capillary 129 (*)    All other components within normal limits  GLUCOSE, CAPILLARY - Abnormal; Notable for the following components:   Glucose-Capillary 147 (*)    All other components within normal limits  CBG MONITORING, ED - Abnormal; Notable for the following components:   Glucose-Capillary 243 (*)    All other components within normal limits  SARS CORONAVIRUS 2 (TAT 6-24 HRS)  ETHANOL  SALICYLATE LEVEL  RAPID URINE DRUG SCREEN, HOSP PERFORMED  AMMONIA  VALPROIC ACID LEVEL  GLUCOSE, CAPILLARY  BASIC METABOLIC PANEL  BASIC METABOLIC PANEL  BASIC METABOLIC PANEL  BASIC METABOLIC PANEL  URINE CYTOLOGY ANCILLARY ONLY    EKG EKG Interpretation  Date/Time:  Thursday February 20 2019 13:14:10 EDT Ventricular Rate:  83 PR Interval:    QRS Duration: 89 QT Interval:  378 QTC Calculation: 445 R Axis:   23 Text Interpretation:  Sinus rhythm Low voltage, precordial leads Confirmed by Milton Ferguson [phone]) on 02/20/2019 3:29:11 PM   Radiology Ct Head Wo Contrast  Result Date: 02/20/2019 CLINICAL DATA:  Altered level consciousness. EXAM: CT HEAD WITHOUT CONTRAST TECHNIQUE: Contiguous axial images were obtained from the base of the skull through the vertex without intravenous contrast. COMPARISON:  12/23/2018 FINDINGS: Brain: No evidence of acute infarction, hemorrhage, extra-axial collection, ventriculomegaly, or mass effect. Generalized cerebral atrophy. Periventricular white matter low attenuation likely secondary to microangiopathy. Vascular: Cerebrovascular atherosclerotic calcifications are noted. Skull: Negative for fracture or focal lesion. Sinuses/Orbits: Visualized portions of the orbits are unremarkable. Visualized portions of the paranasal sinuses and mastoid air cells are unremarkable. Other: None. IMPRESSION: 1. No acute intracranial pathology. Electronically Signed   By: Kathreen Devoid   On: 02/20/2019 16:31   Mr Brain Wo Contrast  Result Date: 02/21/2019 CLINICAL  DATA:  Encephalopathy. EXAM: MRI HEAD WITHOUT CONTRAST TECHNIQUE: Multiplanar, multiecho pulse sequences of the brain and surrounding structures were obtained without intravenous contrast. COMPARISON:  Head CT 02/20/2019 FINDINGS: The examination had to be discontinued prior to completion due to the patient's confusion and attempts to remove the head coil towards the end of the examination. The coronal T2 sequence is severely motion degraded and incomplete. All of the other routine noncontrast brain sequences were obtained although some are moderately motion degraded. Brain: There is no evidence of acute infarct, intracranial hemorrhage, mass, midline shift, or extra-axial fluid collection. Cerebral atrophy is mildly advanced for age. Periventricular white matter T2 hyperintensities are nonspecific but compatible with minimal chronic small vessel ischemic disease. Vascular: Major intracranial vascular flow voids are preserved. Skull and upper cervical spine: No suspicious marrow lesion identified. Sinuses/Orbits: Unremarkable orbits. Clear paranasal sinuses. Small right mastoid effusion. Other: None. IMPRESSION: 1. Motion degraded examination without evidence of acute intracranial abnormality. 2. Mild cerebral atrophy. Electronically Signed   By: Logan Bores M.D.   On: 02/21/2019 10:49    Procedures Procedures (including critical care time)  Medications Ordered in ED  Medications  allopurinol (ZYLOPRIM) tablet 200 mg (has no administration in time range)  amLODipine (NORVASC) tablet 10 mg (has no administration in time range)  hydrOXYzine (ATARAX/VISTARIL) tablet 50 mg (50 mg Oral Given 02/21/19 0325)  lithium carbonate capsule 300 mg (300 mg Oral Not Given 02/21/19 0844)  famotidine (PEPCID) tablet 20 mg (20 mg Oral Given 02/21/19 0005)  pantoprazole (PROTONIX) EC tablet 40 mg (has no administration in time range)  tamsulosin (FLOMAX) capsule 0.4 mg (has no administration in time range)  finasteride  (PROSCAR) tablet 5 mg (has no administration in time range)  benztropine (COGENTIN) tablet 2 mg (2 mg Oral Given 02/21/19 0006)  gabapentin (NEURONTIN) capsule 600 mg (600 mg Oral Given 02/21/19 0005)  albuterol (PROVENTIL) (2.5 MG/3ML) 0.083% nebulizer solution 3 mL (has no administration in time range)  divalproex (DEPAKOTE) DR tablet 500 mg (has no administration in time range)  divalproex (DEPAKOTE) DR tablet 2,000 mg (2,000 mg Oral Given 02/21/19 0005)  enoxaparin (LOVENOX) injection 40 mg (40 mg Subcutaneous Given 02/21/19 0513)  polyethylene glycol (MIRALAX / GLYCOLAX) packet 17 g (has no administration in time range)  acetaminophen (TYLENOL) tablet 650 mg (has no administration in time range)    Or  acetaminophen (TYLENOL) suppository 650 mg (has no administration in time range)  insulin aspart (novoLOG) injection 0-9 Units (0 Units Subcutaneous Not Given 02/21/19 1248)  0.9 %  sodium chloride infusion (has no administration in time range)  sodium chloride 0.9 % bolus 1,000 mL (0 mLs Intravenous Stopped 02/20/19 2016)  sodium zirconium cyclosilicate (LOKELMA) packet 10 g (10 g Oral Given 02/20/19 1534)  sodium chloride 0.9 % bolus 1,000 mL (1,000 mLs Intravenous New Bag/Given 02/21/19 0558)  sodium zirconium cyclosilicate (LOKELMA) packet 10 g (10 g Oral Given 02/21/19 0513)     Initial Impression / Assessment and Plan / ED Course  I have reviewed the triage vital signs and the nursing notes.  Pertinent labs & imaging results that were available during my care of the patient were reviewed by me and considered in my medical decision making (see chart for details).  Clinical Course as of Feb 21 1535  Thu Feb 20, 2019  1633 Plan: Await CT head; Admit for hyperkalemia and hallucinations; obs admission to family medicine.   [BM]  [facility] Family Med   [BM]    Clinical Course User Index [BM] Deliah Boston, PA-C       63 year old male who presents for evaluation of hallucinations.   Patient does not provide much history and just grumbles when I attempt to talk to him.  He follows commands and is able to move all extremities.  Plan for labs, CT head for medical clearance.  We will get behavioral health and well.  Ethanol, Acetaminophen, Salicylate level negative. CMP shows potassium 6.1, CO2 21, Glucose 173 and BUN/CR and 25 and 1.94. CBC shows leukocytosis 10.6. Hgb 10.4, Hct 35.7. Ammonia 22.   Discussed with patient's niece, Claudina Lick.  She states that patient had been admitted for behavioral health concerns and was at [facility] for the last month.  He was discharged yesterday.  She reports that her and her mom brought patient home and stated that he was more subdued but was acting appropriately.  They report that since last night, he started having some increasing paranoia and hallucinations.  At one point, they were driving in the car and he kept screaming that the yellow ball was going to hit them.  Additionally, he was afraid  to open the refrigerator because he saw bugs.  Ms. Wynetta Emery also reports that he had had some abnormal behavior and had walked around the house completely naked.  She states that this is abnormal for him.  She does report that this has all been new within the last 2 months.  She states that he had been stable with his schizophrenia for the last 9 years.  Recently they started decreasing his p.o. Haldol and had and started him on Raylar, which she feels like worsened symptoms.  She does not know of any recent drug use.  Patient has not made any threatening statements of SI or HI.  Repeat potassium still 6.1.  Patient given dose of low,.  Given confusion, will plan to check CT head.  At this time, cannot medically clear patient so he will need medical admission with psych consult.  Patient signed out to oncoming Nuala Alpha, PA-C with head CT, urine pending.  Anticipate admission to family medicine with psych consult.  Portions of this note were  generated with [employer]. Dictation errors may occur despite best attempts at proofreading.    Final Clinical Impressions(s) / ED Diagnoses   Final diagnoses:  Hyperkalemia  Hallucinations    ED Discharge Orders    None       Volanda Napoleon, PA-C 02/20/19 1648    Volanda Napoleon, PA-C 02/21/19 1536    Milton Ferguson, MD 02/21/19 1559

## 2019-02-20 NOTE — H&P (Addendum)
[facility] Admission History and Physical Service Pager: [phone]  Patient name: Dylan Jordan Medical record number: [id number] Date of birth: 1956-05-15 Age: 63 y.o. Gender: male  Primary Care Provider: Shirley, [place], DO Consultants: Psych Code Status: Full Preferred Emergency Contact: Dylan Jordan, niece, [phone]  Chief Complaint: Hallucinations  Assessment and Plan: Dylan Jordan is a 63 y.o. male presenting with hallucinations and elevated potassium. PMH is significant for schizophrenia, type 2 diabetes, CKD, hypertension, hyperlipidemia, Hx alcohol polysubstance abuse.  Hyperkalemia Potassium on admission noted to be 6.1. Confirmed on recheck.  EKG without T wave changes.  Status post 1 L bolus of normal saline and Lokelma in the emergency department without recheck. Unclear etiology, may be 2/2 med changes though not typically reported from patient's prescribed meds.  -Admit to med telemetry, attending Dr. Ardelia Mems -Repeat BMP q4h - consider insulin with dextrose if needed  Altered mental status secondary to psych med changes with patient's psych medications versus trauma versus intracranial pathology Likely etiology recent psychiatric medication changes during most recent hospitalization although reportedly has not been stable since med changes in April/May (2/2 side effects from long term haldol, likely tardive dyskinesia) per niece with visual hallucinations, intermittent garbled speech and erratic behavior. Per chart review, did sustain fall during hospitalization and spent some time in a wheelchair but was reportedly ambulating without assistance after working with PT/OT. Unclear if sustained head trauma or obtained imaging. CT Head on presentation negative for intracranial pathology. Unlikely metabolic as his ammonia level was normal on admission although labs notable for hyperkalemia and slight increase in Cr. Also is without signs of  infection, currently afebrile.  Tox screen negative.  Lithium and depakote levels wnl. -MR brain pending -Follow-up on psych recommendations regarding patient medications -Continue to monitor - continue home psych meds: cogentin, depakote, gabapentin, lithium, hydroxyzine prn - 1:1 sitter - NPO until mental status clears  Schizophrenia Patient recently discharged from behavioral health hospital after staying there for approximately 1.5 months.  Patient home meds include Depakote, lithium, benztropine.  Lithium level of 1.21.  CT head negative for acute intracranial pathology. -Continue home medications -Psych consult, appreciate recommendations -Suicide precautions -MRI brain  Anxiety Home medications include hydroxyzine -Continue hydroxyzine  Type 2 diabetes Home medications include Lantus 45 units.  On gabapentin for neuropathy in his feet. -Sensitive sliding scale  - hold lantus given NPO -Continue gabapentin  CKD Creatinine on admission of 1.94. Baseline appears to be somewhere between 1.4 and 1.8. Trace LE edema. -Monitor fluid status  Hypertension Normotensive on admission. Home medications include amlodipine 10 mg -Continue amlodipine  Hyperlipidemia Medications include atorvastatin 20 mg -Continue atorvastatin  History of alcohol/polysubstance abuse Patient only one day out of a 1.42-monthhospital stay at behavioral health.  Not concerned for any withdrawal at this point. -Recommend cessation to patient during discharge  GERD All medications include famotidine 20 mg, Protonix 40 mg. -Continue home Protonix.  Gout Patient on allopurinol at home medication -Continue home medications  Constipation Home medications include polyethylene glycol 17 mg -Continue MiraLAX  BPH With mixed incontinence. Home medications include finasteride, tamsulosin -Continue home medications  FEN/GI: NPO Prophylaxis: Lovenox  Disposition: Behavioral health pending resolution  of hyperkalemia  History of Present Illness:  Dylan Jordan a 63y.o. male presenting with hallucinations found to have elevated potassium.  History is limited due to patient speech which is both garbled and not comprehensible.  Much of the history is obtained from patient's niece, SClaudina Jordan  Patient was apparently picked up from the behavioral health hospital yesterday by his niece.  The patient had apparently been in the behavioral health hospital for about a month and a half.  She noted when he was discharged he could not walk without assistance.  Upon driving home the patient started complaining of seeing a woman in the road in front the car and attempted to grab the steering wheel to swerve around her.  When his niece got him home he complained of seeing bugs all over the floor tried to stomp them vigorously trying to kill them. Patient's speech was garbled at this time and he started having increase in audiovisual hallucinations.  His niece states that her and her husband did not sleep worried what he might do.  In the morning the patient was in a similar state and the patient was brought to the emergency department.  His sister did say that at times he could talk and was very clear, followed by times where his speech was very garbled and she was unable to understand.  She states that they recently discontinued his Haldol which she has been on for many years and added a new medication Vraylar.  Patient has been on lithium, Depakote, Cogentin long-term.  Patient was apparently somewhat stable up until around April or May at which time several medication adjustments were made due to adverse effects of the medications.  On exam the patient was very hard to understand but did seem to indicate that he was experiencing no pain or discomfort.  Review Of Systems: Per HPI with the following additions:   Review of Systems  Unable to perform ROS: Psychiatric disorder    Patient Active Problem  List   Diagnosis Date Noted  . Anxiety 09/18/2018  . Swelling of both lower extremities 09/13/2018  . Schizophrenia ([place]) 07/06/2018  . Type 2 diabetes mellitus with diabetic chronic kidney disease ([place])   . Anemia   . Hyperlipidemia 12/18/2017  . 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 Intermittent chest pain 11/25/2017  . Allergic rhinitis with a nonallergic component 07/30/2017  . Food allergy 07/30/2017  . Chronic gout involving toe of left foot without tophus 05/18/2017  . Hemorrhoids 05/04/2017  . Haloperidol adverse reaction 04/16/2017  . Chronic low back pain 01/03/2017  . Chronic cough 06/30/2016  . Drug-induced mood disorder ([place]) 06/14/2016  . Type 2 diabetes mellitus with diabetic neuropathy ([place]) 05/26/2016  . Iron deficiency anemia 05/26/2016  . Chronic kidney disease 05/26/2016  . HTN (hypertension) 05/26/2016  . Tobacco abuse 05/26/2016  . History of substance abuse ([place]) 05/26/2016  . History of alcohol abuse 05/26/2016  . Mild persistent asthma 05/26/2016    Past Medical History: Past Medical History:  Diagnosis Date  . Anemia   . Asthma   . Diabetes mellitus without complication ([place])   . Gout   . Hyperlipidemia   . Hyperosmolar hyperglycemic coma due to diabetes mellitus without ketoacidosis ([place])   . Hypertension   . Schizophrenia ([place]) 07/06/2018    Past Surgical History: Past Surgical History:  Procedure Laterality Date  . REPLACEMENT TOTAL KNEE Left   . TOE SURGERY      Social History: Social History   Tobacco Use  . Smoking status: Current Some Day Smoker    Packs/day: 0.30    Years: 45.00    Pack years: 13.50    Types: Cigarettes    Start date: 05/16/1971  . Smokeless tobacco: Never Used  . Tobacco  comment: Decreased Intake to 10 cigarettes per week or less.  Substance Use Topics  . Alcohol use: No  . Drug use: No   Additional social history: lives near niece Please also refer to relevant sections of  EMR.  Family History: Family History  Adopted: Yes  Problem Relation Age of Onset  . Allergic rhinitis Neg Hx   . Angioedema Neg Hx   . Asthma Neg Hx   . Atopy Neg Hx   . Eczema Neg Hx   . Immunodeficiency Neg Hx   . Urticaria Neg Hx     Allergies and Medications: Allergies  Allergen Reactions  . Chantix [Varenicline Tartrate] Other (See Comments)    Nightmares  . Ativan [Lorazepam] Other (See Comments)    Agitation (and "5 others like it") Will need to call [facility]. [facility] in [place] (phone) [phone] and [phone] to fax a release-   . Shellfish-Derived Products Itching  . Penicillins Diarrhea    Has patient had a PCN reaction causing immediate rash, facial/tongue/throat swelling, SOB or lightheadedness with hypotension: No Has patient had a PCN reaction causing severe rash involving mucus membranes or skin necrosis: No Has patient had a PCN reaction that required hospitalization: No Has patient had a PCN reaction occurring within the last 10 years: No If all of the above answers are "NO", then may proceed with Cephalosporin use.    No current facility-administered medications on file prior to encounter.    Current Outpatient Medications on File Prior to Encounter  Medication Sig Dispense Refill  . Accu-Chek Softclix Lancets lancets 1 each by Other route 3 (three) times daily. 100 each 9  . albuterol (PROVENTIL HFA;VENTOLIN HFA) 108 (90 Base) MCG/ACT inhaler Inhale 1-2 puffs into the lungs every 6 (six) hours as needed for wheezing or shortness of breath. 1 Inhaler 2  . allopurinol (ZYLOPRIM) 100 MG tablet Take 2 tablets (200 mg total) by mouth daily. 180 tablet 3  . amLODipine (NORVASC) 10 MG tablet Take 10 mg by mouth daily.    . benztropine (COGENTIN) 2 MG tablet Take 2 mg by mouth 2 (two) times daily.    . Blood Glucose Monitoring Suppl (ACCU-CHEK AVIVA PLUS) w/Device KIT Use to check blood sugar three times daily or as directed. 1 kit 0  . divalproex  (DEPAKOTE) 500 MG DR tablet Take 500-2,000 mg by mouth See admin instructions. Take one tablet by mouth every morning, then take 4 tablets by mouth at bedtime per family member    . famotidine (PEPCID) 20 MG tablet Take 20 mg by mouth 2 (two) times daily.    . finasteride (PROSCAR) 5 MG tablet Take 1 tablet (5 mg total) by mouth daily. 30 tablet 11  . gabapentin (NEURONTIN) 300 MG capsule TAKE 1 CAPSULE BY MOUTH EVERY MORNING AND 2 CAPSULES AT BEDTIME (Patient taking differently: Take 600 mg by mouth 3 (three) times daily. ) 90 capsule 1  . hydrOXYzine (ATARAX/VISTARIL) 50 MG tablet Take 50 mg by mouth every 6 (six) hours as needed for anxiety.    . Insulin Glargine (LANTUS SOLOSTAR) 100 UNIT/ML Solostar Pen Inject 25 Units into the skin daily. (Patient taking differently: Inject 45 Units into the skin daily. ) 3 pen 12  . insulin lispro (HUMALOG) 100 UNIT/ML injection Inject 10 Units into the skin 3 (three) times daily before meals.    Marland Kitchen lithium carbonate 300 MG capsule Take 300 mg by mouth 2 (two) times daily with a meal.    . NOVOFINE  PLUS 32G X 4 MM MISC 1 Container by Does not apply route daily.    . pantoprazole (PROTONIX) 40 MG tablet Take 40 mg by mouth daily.    . tamsulosin (FLOMAX) 0.4 MG CAPS capsule Take 2 capsules (0.8 mg total) by mouth daily. (Patient taking differently: Take 0.4 mg by mouth daily. ) 30 capsule 0  . glucose blood (ACCU-CHEK AVIVA PLUS) test strip Dispense QS for twice daily testing. 100 each 12  . Insulin Pen Needle (BD PEN NEEDLE NANO U/F) 32G X 4 MM MISC Inject 1 applicator as directed 2 (two) times daily. 100 each 2    Objective: BP 117/80   Pulse 78   Temp 98.2 F (36.8 C) (Oral)   Resp 14   Ht '6\' 1"'  (1.854 m)   Wt 113.1 kg   SpO2 97%   BMI 32.90 kg/m  Exam: General: Patient sedated, with some speech that is garbled and not understandable, falling asleep during physical exam. Cardiovascular: RRR with no murmurs noted Respiratory: CTA  bilaterally  Gastrointestinal: Bowel sounds present. No apparent abdominal pain Derm: No rashes noted Psych: Speech garbled, incomprehensible Extremities: trace edema and lower extremities bilaterally  Labs and Imaging: CBC BMET  Recent Labs  Lab 02/20/19 1044  WBC 10.6*  HGB 10.4*  HCT 35.7*  PLT 280   Recent Labs  Lab 02/20/19 1044 02/20/19 1309  NA 137  --   K 6.1* 6.1*  CL 107  --   CO2 21*  --   BUN 25*  --   CREATININE 1.94*  --   GLUCOSE 173*  --   CALCIUM 9.1  --      EKG: Sinus rhythm at 83 bpm.  No peaked T waves.  Ct Head Wo Contrast  Result Date: 02/20/2019 CLINICAL DATA:  Altered level consciousness. EXAM: CT HEAD WITHOUT CONTRAST TECHNIQUE: Contiguous axial images were obtained from the base of the skull through the vertex without intravenous contrast. COMPARISON:  12/23/2018 FINDINGS: Brain: No evidence of acute infarction, hemorrhage, extra-axial collection, ventriculomegaly, or mass effect. Generalized cerebral atrophy. Periventricular white matter low attenuation likely secondary to microangiopathy. Vascular: Cerebrovascular atherosclerotic calcifications are noted. Skull: Negative for fracture or focal lesion. Sinuses/Orbits: Visualized portions of the orbits are unremarkable. Visualized portions of the paranasal sinuses and mastoid air cells are unremarkable. Other: None. IMPRESSION: 1. No acute intracranial pathology. Electronically Signed   By: Kathreen Devoid   On: 02/20/2019 16:31    Rory Percy, DO 02/20/2019, 8:58 PM PGY-1, [place] Intern pager: [phone], text pages welcome  FPTS Upper-Level Resident Addendum   I have independently interviewed and examined the patient. I have discussed the above with the original author and agree with their documentation. My edits for correction/addition/clarification are in green. Please see also any attending notes.    Rory Percy, DO PGY-3, [facility] 02/20/2019  8:58 PM  [place] Service pager: [phone] (text pages welcome through [facility])

## 2019-02-20 NOTE — ED Notes (Signed)
Patient transported to CT 

## 2019-02-20 NOTE — ED Notes (Signed)
Pt CBG was 243, notified Annie(RN)

## 2019-02-20 NOTE — ED Notes (Signed)
ED TO INPATIENT HANDOFF REPORT  ED Nurse Name and Phone #: [phone] Hale Drone, RN  S Name/Age/Gender Dylan Jordan 63 y.o. male Room/Bed: 038C/038C  Code Status   Code Status: Prior  Home/SNF/Other Home Patient oriented to: self Is this baseline? No   Triage Complete: Triage complete  Chief Complaint z04.6  Triage Note Pt reports hallucinations for the past 2 months and had a recent hospital stay. Pt reports his symptoms have not gone away.    Allergies Allergies  Allergen Reactions  . Chantix [Varenicline Tartrate] Other (See Comments)    Nightmares  . Ativan [Lorazepam] Other (See Comments)    Agitation (and "5 others like it") Will need to call [facility] [place] (phone) [phone] and [phone] to fax a release-   . Shellfish-Derived Products Itching  . Penicillins Diarrhea    Has patient had a PCN reaction causing immediate rash, facial/tongue/throat swelling, SOB or lightheadedness with hypotension: No Has patient had a PCN reaction causing severe rash involving mucus membranes or skin necrosis: No Has patient had a PCN reaction that required hospitalization: No Has patient had a PCN reaction occurring within the last 10 years: No If all of the above answers are "NO", then may proceed with Cephalosporin use.     Level of Care/Admitting Diagnosis ED Disposition    ED Disposition Condition Comment   Admit  Hospital Area: [facility] [[id number]]  Level of Care: Telemetry Medical [104]  Covid Evaluation: Asymptomatic Screening Protocol (No Symptoms)  Diagnosis: Hallucinations [780.1.ICD-9-CM]  Admitting Physician: Ellwood Dense [[id number]]  Attending Physician: Latrelle Dodrill [[id number]]  PT Class (Do Not Modify): Observation [104]  PT Acc Code (Do Not Modify): Observation [10022]       B Medical/Surgery History Past Medical History:  Diagnosis Date  . Anemia   . Asthma   . Diabetes mellitus without complication  (HCC)   . Gout   . Hyperlipidemia   . Hyperosmolar hyperglycemic coma due to diabetes mellitus without ketoacidosis (HCC)   . Hypertension   . Schizophrenia (HCC) 07/06/2018   Past Surgical History:  Procedure Laterality Date  . REPLACEMENT TOTAL KNEE Left   . TOE SURGERY       A IV Location/Drains/Wounds Patient Lines/Drains/Airways Status   Active Line/Drains/Airways    Name:   Placement date:   Placement time:   Site:   Days:   Peripheral IV 02/20/19 Right Hand   02/20/19    1353    Hand   less than 1          Intake/Output Last 24 hours  Intake/Output Summary (Last 24 hours) at 02/20/2019 2123 Last data filed at 02/20/2019 2044 Gross per 24 hour  Intake 770 ml  Output 1700 ml  Net -930 ml    Labs/Imaging Results for orders placed or performed during the hospital encounter of 02/20/19 (from the past 48 hour(s))  Comprehensive metabolic panel     Status: Abnormal   Collection Time: 02/20/19 10:44 AM  Result Value Ref Range   Sodium 137 135 - 145 mmol/L   Potassium 6.1 (H) 3.5 - 5.1 mmol/L   Chloride 107 98 - 111 mmol/L   CO2 21 (L) 22 - 32 mmol/L   Glucose, Bld 173 (H) 70 - 99 mg/dL   BUN 25 (H) 8 - 23 mg/dL   Creatinine, Ser 5.95 (H) 0.61 - 1.24 mg/dL   Calcium 9.1 8.9 - 63.8 mg/dL   Total Protein 7.2 6.5 - 8.1 g/dL  Albumin 3.0 (L) 3.5 - 5.0 g/dL   AST 19 15 - 41 U/L   ALT 16 0 - 44 U/L   Alkaline Phosphatase 60 38 - 126 U/L   Total Bilirubin 0.4 0.3 - 1.2 mg/dL   GFR calc non Af Amer 36 (L) >60 mL/min   GFR calc Af Amer 42 (L) >60 mL/min   Anion gap 9 5 - 15    Comment: Performed at [facility], [address], [place], [place] [address]  Ethanol     Status: None   Collection Time: 02/20/19 10:44 AM  Result Value Ref Range   Alcohol, Ethyl (B) <10 <10 mg/dL    Comment: (NOTE) Lowest detectable limit for serum alcohol is 10 mg/dL. For medical purposes only. Performed at [facility], [address] [place], [place] [address]    Salicylate level     Status: None   Collection Time: 02/20/19 10:44 AM  Result Value Ref Range   Salicylate Lvl <7.0 2.8 - 30.0 mg/dL    Comment: Performed at [facility], [address] [place], [place] [address]  Acetaminophen level     Status: Abnormal   Collection Time: 02/20/19 10:44 AM  Result Value Ref Range   Acetaminophen (Tylenol), Serum <10 (L) 10 - 30 ug/mL    Comment: (NOTE) Therapeutic concentrations vary significantly. A range of 10-30 ug/mL  may be an effective concentration for many patients. However, some  are best treated at concentrations outside of this range. Acetaminophen concentrations >150 ug/mL at 4 hours after ingestion  and >50 ug/mL at 12 hours after ingestion are often associated with  toxic reactions. Performed at [facility], [address], [place], [place] [address]   cbc     Status: Abnormal   Collection Time: 02/20/19 10:44 AM  Result Value Ref Range   WBC 10.6 (H) 4.0 - 10.5 K/uL   RBC 3.72 (L) 4.22 - 5.81 MIL/uL   Hemoglobin 10.4 (L) 13.0 - 17.0 g/dL   HCT 00.3 (L) 49.1 - 79.1 %   MCV 96.0 80.0 - 100.0 fL   MCH 28.0 26.0 - 34.0 pg   MCHC 29.1 (L) 30.0 - 36.0 g/dL   RDW 50.5 69.7 - 94.8 %   Platelets 280 150 - 400 K/uL   nRBC 0.0 0.0 - 0.2 %    Comment: Performed at [facility], [address] [place], [place] [address]  CBG monitoring, ED     Status: Abnormal   Collection Time: 02/20/19 12:56 PM  Result Value Ref Range   Glucose-Capillary 243 (H) 70 - 99 mg/dL   Comment 1 Notify RN    Comment 2 Document in Chart   Potassium     Status: Abnormal   Collection Time: 02/20/19  1:09 PM  Result Value Ref Range   Potassium 6.1 (H) 3.5 - 5.1 mmol/L    Comment: Performed at [facility], [address] [place], [place] [address]  Ammonia     Status: None   Collection Time: 02/20/19  1:29 PM  Result Value Ref Range   Ammonia 22 9 - 35 umol/L    Comment: Performed at [facility], [address],  [place], [place] [address]  Lithium level     Status: Abnormal   Collection Time: 02/20/19  3:51 PM  Result Value Ref Range   Lithium Lvl 1.21 (H) 0.60 - 1.20 mmol/L    Comment: Performed  at [facility], [address] [place], [place] [address]  Valproic acid level     Status: None   Collection Time: 02/20/19  3:51 PM  Result Value Ref Range   Valproic Acid Lvl 72 50.0 - 100.0 ug/mL    Comment: Performed at [facility], [address] [place], [place] [address]  Urinalysis, Routine w reflex microscopic     Status: Abnormal   Collection Time: 02/20/19  4:22 PM  Result Value Ref Range   Color, Urine STRAW (A) YELLOW   APPearance CLEAR CLEAR   Specific Gravity, Urine 1.004 (L) 1.005 - 1.030   pH 7.0 5.0 - 8.0   Glucose, UA NEGATIVE NEGATIVE mg/dL   Hgb urine dipstick NEGATIVE NEGATIVE   Bilirubin Urine NEGATIVE NEGATIVE   Ketones, ur NEGATIVE NEGATIVE mg/dL   Protein, ur NEGATIVE NEGATIVE mg/dL   Nitrite NEGATIVE NEGATIVE   Leukocytes,Ua NEGATIVE NEGATIVE    Comment: Performed at [facility], [address] [place], [place] [address]  Rapid urine drug screen (hospital performed)     Status: None   Collection Time: 02/20/19  4:30 PM  Result Value Ref Range   Opiates NONE DETECTED NONE DETECTED   Cocaine NONE DETECTED NONE DETECTED   Benzodiazepines NONE DETECTED NONE DETECTED   Amphetamines NONE DETECTED NONE DETECTED   Tetrahydrocannabinol NONE DETECTED NONE DETECTED   Barbiturates NONE DETECTED NONE DETECTED    Comment: (NOTE) DRUG SCREEN FOR MEDICAL PURPOSES ONLY.  IF CONFIRMATION IS NEEDED FOR ANY PURPOSE, NOTIFY LAB WITHIN 5 DAYS. LOWEST DETECTABLE LIMITS FOR URINE DRUG SCREEN Drug Class                     Cutoff (ng/mL) Amphetamine and metabolites    1000 Barbiturate and metabolites    200 Benzodiazepine                 200 Tricyclics and metabolites     300 Opiates and metabolites        300 Cocaine and metabolites        300 THC                             50 Performed at [facility], [address] [place], [place] [address]    Ct Head Wo Contrast  Result Date: 02/20/2019 CLINICAL DATA:  Altered level consciousness. EXAM: CT HEAD WITHOUT CONTRAST TECHNIQUE: Contiguous axial images were obtained from the base of the skull through the vertex without intravenous contrast. COMPARISON:  12/23/2018 FINDINGS: Brain: No evidence of acute infarction, hemorrhage, extra-axial collection, ventriculomegaly, or mass effect. Generalized cerebral atrophy. Periventricular white matter low attenuation likely secondary to microangiopathy. Vascular: Cerebrovascular atherosclerotic calcifications are noted. Skull: Negative for fracture or focal lesion. Sinuses/Orbits: Visualized portions of the orbits are unremarkable. Visualized portions of the paranasal sinuses and mastoid air cells are unremarkable. Other: None. IMPRESSION: 1. No acute intracranial pathology. Electronically Signed   By: Elige KoHetal  Patel   On: 02/20/2019 16:31    Pending Labs Unresulted Labs (From admission, onward)    Start     Ordered   02/20/19 1920  SARS CORONAVIRUS 2 (TAT 6-24 HRS) Nasopharyngeal Nasopharyngeal Swab  (Asymptomatic/Tier 2 Patients Labs)  Once,   STAT    Question Answer Comment  Is this test for diagnosis or screening Screening   Symptomatic for COVID-19 as defined by CDC No   Hospitalized for  COVID-19 No   Admitted to ICU for COVID-19 No   Previously tested for COVID-19 Yes   Resident in a congregate (group) care setting No   Employed in healthcare setting No      02/20/19 1919   02/20/19 7017  Basic metabolic panel  ONCE - STAT,   STAT     02/20/19 1755   Signed and Held  CBC  (enoxaparin (LOVENOX)    CrCl >/= 30 ml/min)  Once,   R    Comments: Baseline for enoxaparin therapy IF NOT ALREADY DRAWN.  Notify MD if PLT < 100 K.    Signed and Held   Signed and Held  Creatinine, serum  (enoxaparin (LOVENOX)    CrCl >/= 30 ml/min)  Once,   R    Comments:  Baseline for enoxaparin therapy IF NOT ALREADY DRAWN.    Signed and Held   Signed and Held  Creatinine, serum  (enoxaparin (LOVENOX)    CrCl >/= 30 ml/min)  Weekly,   R    Comments: while on enoxaparin therapy    Signed and Held   Signed and Held  Basic metabolic panel  Now then every 6 hours,   R     Signed and Held   Signed and Held  Hemoglobin A1c  Once,   R     Signed and Held          Vitals/Pain Today's Vitals   02/20/19 1700 02/20/19 1730 02/20/19 1800 02/20/19 1830  BP: 126/87 132/76 119/81 117/80  Pulse: 77 76 77 78  Resp: 20 (!) 25 (!) 22 14  Temp:      TempSrc:      SpO2: 97% 96% 95% 97%  Weight:      Height:      PainSc:        Isolation Precautions No active isolations  Medications Medications  sodium chloride 0.9 % bolus 1,000 mL (0 mLs Intravenous Stopped 02/20/19 2016)  sodium zirconium cyclosilicate (LOKELMA) packet 10 g (10 g Oral Given 02/20/19 1534)    Mobility manual wheelchair High fall risk   Focused Assessments Cardiac Assessment Handoff:    Lab Results  Component Value Date   CKTOTAL 282 12/25/2018   TROPONINI <0.03 08/24/2017   Lab Results  Component Value Date   DDIMER 0.64 (H) 05/20/2017   Does the Patient currently have chest pain? No     R Recommendations: See Admitting Provider Note  Report given to:   Additional Notes:

## 2019-02-21 ENCOUNTER — Observation Stay (HOSPITAL_COMMUNITY): Payer: Medicaid Other

## 2019-02-21 DIAGNOSIS — F419 Anxiety disorder, unspecified: Secondary | ICD-10-CM

## 2019-02-21 DIAGNOSIS — E875 Hyperkalemia: Secondary | ICD-10-CM | POA: Diagnosis not present

## 2019-02-21 DIAGNOSIS — K5900 Constipation, unspecified: Secondary | ICD-10-CM | POA: Diagnosis not present

## 2019-02-21 DIAGNOSIS — F250 Schizoaffective disorder, bipolar type: Secondary | ICD-10-CM | POA: Diagnosis present

## 2019-02-21 DIAGNOSIS — R443 Hallucinations, unspecified: Secondary | ICD-10-CM

## 2019-02-21 LAB — BASIC METABOLIC PANEL
Anion gap: 7 (ref 5–15)
Anion gap: 10 (ref 5–15)
Anion gap: 8 (ref 5–15)
Anion gap: 7 (ref 5–15)
BUN: 23 mg/dL (ref 8–23)
BUN: 18 mg/dL (ref 8–23)
BUN: 18 mg/dL (ref 8–23)
BUN: 17 mg/dL (ref 8–23)
CO2: 21 mmol/L — ABNORMAL LOW (ref 22–32)
CO2: 22 mmol/L (ref 22–32)
CO2: 20 mmol/L — ABNORMAL LOW (ref 22–32)
CO2: 22 mmol/L (ref 22–32)
Calcium: 9.1 mg/dL (ref 8.9–10.3)
Calcium: 9.5 mg/dL (ref 8.9–10.3)
Calcium: 9.2 mg/dL (ref 8.9–10.3)
Calcium: 9.1 mg/dL (ref 8.9–10.3)
Chloride: 114 mmol/L — ABNORMAL HIGH (ref 98–111)
Chloride: 114 mmol/L — ABNORMAL HIGH (ref 98–111)
Chloride: 114 mmol/L — ABNORMAL HIGH (ref 98–111)
Chloride: 116 mmol/L — ABNORMAL HIGH (ref 98–111)
Creatinine, Ser: 1.51 mg/dL — ABNORMAL HIGH (ref 0.61–1.24)
Creatinine, Ser: 1.58 mg/dL — ABNORMAL HIGH (ref 0.61–1.24)
Creatinine, Ser: 1.56 mg/dL — ABNORMAL HIGH (ref 0.61–1.24)
Creatinine, Ser: 1.52 mg/dL — ABNORMAL HIGH (ref 0.61–1.24)
GFR calc Af Amer: 57 mL/min — ABNORMAL LOW (ref >60)
GFR calc Af Amer: 54 mL/min — ABNORMAL LOW (ref >60)
GFR calc Af Amer: 54 mL/min — ABNORMAL LOW (ref >60)
GFR calc Af Amer: 56 mL/min — ABNORMAL LOW (ref >60)
GFR calc non Af Amer: 49 mL/min — ABNORMAL LOW (ref >60)
GFR calc non Af Amer: 46 mL/min — ABNORMAL LOW (ref >60)
GFR calc non Af Amer: 47 mL/min — ABNORMAL LOW (ref >60)
GFR calc non Af Amer: 48 mL/min — ABNORMAL LOW (ref >60)
Glucose, Bld: 142 mg/dL — ABNORMAL HIGH (ref 70–99)
Glucose, Bld: 176 mg/dL — ABNORMAL HIGH (ref 70–99)
Glucose, Bld: 171 mg/dL — ABNORMAL HIGH (ref 70–99)
Glucose, Bld: 140 mg/dL — ABNORMAL HIGH (ref 70–99)
Potassium: 5.4 mmol/L — ABNORMAL HIGH (ref 3.5–5.1)
Potassium: 4.8 mmol/L (ref 3.5–5.1)
Potassium: 5.3 mmol/L — ABNORMAL HIGH (ref 3.5–5.1)
Potassium: 4.9 mmol/L (ref 3.5–5.1)
Sodium: 142 mmol/L (ref 135–145)
Sodium: 146 mmol/L — ABNORMAL HIGH (ref 135–145)
Sodium: 142 mmol/L (ref 135–145)
Sodium: 145 mmol/L (ref 135–145)

## 2019-02-21 LAB — GLUCOSE, CAPILLARY
Glucose-Capillary: 123 mg/dL — ABNORMAL HIGH (ref 70–99)
Glucose-Capillary: 129 mg/dL — ABNORMAL HIGH (ref 70–99)
Glucose-Capillary: 135 mg/dL — ABNORMAL HIGH (ref 70–99)
Glucose-Capillary: 147 mg/dL — ABNORMAL HIGH (ref 70–99)
Glucose-Capillary: 255 mg/dL — ABNORMAL HIGH (ref 70–99)
Glucose-Capillary: 75 mg/dL (ref 70–99)

## 2019-02-21 LAB — LITHIUM LEVEL: Lithium Lvl: 1.07 mmol/L (ref 0.60–1.20)

## 2019-02-21 LAB — VALPROIC ACID LEVEL: Valproic Acid Lvl: 87 ug/mL (ref 50.0–100.0)

## 2019-02-21 LAB — SARS CORONAVIRUS 2 (TAT 6-24 HRS): SARS Coronavirus 2: NEGATIVE

## 2019-02-21 MED ORDER — INSULIN ASPART 100 UNIT/ML ~~LOC~~ SOLN
0.0000 [IU] | SUBCUTANEOUS | Status: DC
  Administered 2019-02-21: 1 [IU] via SUBCUTANEOUS
  Administered 2019-02-21: 5 [IU] via SUBCUTANEOUS
  Administered 2019-02-21 (×2): 1 [IU] via SUBCUTANEOUS
  Administered 2019-02-22: 7 [IU] via SUBCUTANEOUS

## 2019-02-21 MED ORDER — HALOPERIDOL 0.5 MG PO TABS
0.5000 mg | ORAL_TABLET | Freq: Two times a day (BID) | ORAL | Status: DC
  Administered 2019-02-21 – 2019-03-04 (×22): 0.5 mg via ORAL
  Filled 2019-02-21 (×22): qty 1

## 2019-02-21 MED ORDER — GENERIC EXTERNAL MEDICATION
Status: DC
Start: ? — End: 2019-02-21

## 2019-02-21 MED ORDER — SODIUM CHLORIDE 0.9 % IV SOLN
INTRAVENOUS | Status: DC
  Administered 2019-02-21 – 2019-02-22 (×4): via INTRAVENOUS

## 2019-02-21 MED ORDER — HYDROXYZINE HCL 25 MG PO TABS
25.0000 mg | ORAL_TABLET | Freq: Four times a day (QID) | ORAL | Status: DC | PRN
  Administered 2019-02-21 – 2019-03-27 (×16): 25 mg via ORAL
  Filled 2019-02-21 (×17): qty 1

## 2019-02-21 MED ORDER — BENZTROPINE MESYLATE 1 MG PO TABS
1.0000 mg | ORAL_TABLET | Freq: Two times a day (BID) | ORAL | Status: DC
  Administered 2019-02-21 – 2019-03-14 (×42): 1 mg via ORAL
  Filled 2019-02-21 (×44): qty 1

## 2019-02-21 MED ORDER — SODIUM ZIRCONIUM CYCLOSILICATE 10 G PO PACK
10.0000 g | PACK | Freq: Once | ORAL | Status: AC
  Administered 2019-02-21: 10 g via ORAL
  Filled 2019-02-21: qty 1

## 2019-02-21 MED ORDER — SODIUM CHLORIDE 0.9 % IV BOLUS
1000.0000 mL | Freq: Once | INTRAVENOUS | Status: AC
  Administered 2019-02-21: 1000 mL via INTRAVENOUS

## 2019-02-21 NOTE — Progress Notes (Signed)
Pt accepted to [facility]  Dr. Dareen Piano is the accepting/attending provider.    Call report to [phone]    Wendi @ [facility] medical floor notified.  She will talk to pt's provider about placing pt under IVC.   IVC paperwork should be faxed to [place] at [phone]  Pt may arrive at [facility] after Covid test results return as negative.   Audree Camel, LCSW, [place] Disposition [facility]/TTS [phone]

## 2019-02-21 NOTE — Plan of Care (Signed)
  Problem: Activity: Goal: Risk for activity intolerance will decrease Outcome: Progressing   

## 2019-02-21 NOTE — Plan of Care (Signed)
  Problem: Activity: Goal: Risk for activity intolerance will decrease Outcome: Progressing   Problem: Coping: Goal: Level of anxiety will decrease Outcome: Progressing   

## 2019-02-21 NOTE — Clinical Social Work Note (Signed)
[name] advised by colleague Army Melia that patient has a bed at [facility], per telephone call from Audree Camel. Also informed that patient will need to be  IVC'd. Paperwork completed and contact made with MD (2:09 pm) regarding psych bed availability and IVC paperwork completed and needing his signature. Per Dr. Pilar Plate, patient is not medically stable due to elevated potasium.   Call made to [place] (2:17 pm) and intake person advised that patient not medically stable. [name] advised that Mr. Decicco will be removed from their wait list and once medically stable, contact can be made to determine bed availability. MD was contacted and updated. Dr. Pilar Plate also had a concern regarding patient being IVC'd and after talking with intake person at [facility], MD advised that it is up to their medical director after a review of a patient's information, and the request was made for this patient to be IVC'd.  [name] will continue to follow and initiate search for a psych facility, if appropriate once medically stable.  Zenith Lamphier Givens, MSW, LCSW Licensed Clinical Social Worker [place] [phone]

## 2019-02-21 NOTE — Progress Notes (Signed)
Pt meets inpatient criteria. [facility] is currently reviewing pt for admission.   Audree Camel, LCSW, [place] Disposition [facility] B[facility]/TTS [phone]

## 2019-02-21 NOTE — Progress Notes (Signed)
Family Medicine Teaching Service Daily Progress Note Intern Pager: [phone]  Patient name: Dylan Jordan Medical record number: [id number] Date of birth: [birth year] Age: 63 y.o. Gender: male  Primary Care Provider: Shirley, [place], DO Consultants: Psych Code Status: Full  Pt Overview and Major Events to Date:  10/8-admitted  Assessment and Plan: Dylan Jordan is a 63 y.o. male presenting with hallucinations and elevated potassium. PMH is significant for schizophrenia, type 2 diabetes, CKD, hypertension, hyperlipidemia, Hx alcohol polysubstance abuse.  Hyperkalemia Most recent potassium 5.4 at 0330. Lokelma given. Potassium recheck pending.  In the emergency department potassium was elevated to 6.1.  This was confirmed on recheck. EKG without T wave changes.  Status post 1 L bolus of normal saline and Lokelma in the emergency department without recheck. Unclear etiology, may be 2/2 med changes though not typically reported from patient's prescribed meds.  -Repeat BMP q4h - consider insulin with dextrose if needed  Altered mental status secondary to psych med changes with patient's psych medications versus trauma versus intracranial pathology Likely etiology recent psychiatric medication changes during most recent hospitalization although reportedly has not been stable since med changes in April/May (2/2 side effects from long term haldol, likely tardive dyskinesia) per niece with visual hallucinations, intermittent garbled speech and erratic behavior. Per chart review, did sustain fall during hospitalization and spent some time in a wheelchair but was reportedly ambulating without assistance after working with PT/OT. Unclear if sustained head trauma or obtained imaging. CT Head on presentation negative for intracranial pathology. Unlikely metabolic as his ammonia level was normal on admission although labs notable for hyperkalemia and slight increase in Cr. Also is without signs of infection,  currently afebrile.  Tox screen negative.  Lithium and depakote levels wnl. -MR brain pending -Follow-up on psych recommendations regarding patient medications -Continue to monitor - continue home psych meds: cogentin, depakote, gabapentin, lithium, hydroxyzine prn - 1:1 sitter - NPO until mental status clears -Start normal saline at maintenance as patient is n.p.o. -Plan to recheck lithium levels in approximately 2 days as patient's lithium level was borderline elevated.  Schizophrenia Patient recently discharged from behavioral health hospital after staying there for approximately 1.5 months.  Patient home meds include Depakote, lithium, benztropine.  Lithium level of 1.21.  CT head negative for acute intracranial pathology. -Continue home medications -Psych consult, appreciate recommendations -Suicide precautions -MRI brain as above -Plan to recheck lithium levels this Sunday as patient's lithium level was slightly elevated compared to previous measurements.  Anxiety Home medications include hydroxyzine -Continue hydroxyzine  Type 2 diabetes Home medications include Lantus 45 units.  On gabapentin for neuropathy in his feet. -Sensitive sliding scale  - hold lantus given NPO -Continue gabapentin  CKD Creatinine on admission of 1.94.  Baseline appears to be somewhere between 1.2-1.3. Trace LE edema. -Monitor fluid status  Hypertension Currently normotensive. Home medications include amlodipine 10 mg -Continue amlodipine  Hyperlipidemia Medications include atorvastatin 20 mg -Continue atorvastatin  History of alcohol/polysubstance abuse Patient only one day out of a 1.97-month hospital stay at behavioral health.  Not concerned for any withdrawal at this point. -Recommend cessation to patient during discharge  GERD All medications include famotidine 20 mg, Protonix 40 mg. -Continue home Protonix.  Gout Patient on allopurinol at home medication -Continue home  medications  Constipation Home medications include polyethylene glycol 17 mg -Continue MiraLAX  BPH With mixed incontinence. Home medications include finasteride, tamsulosin -Continue home medications  FEN/GI: NPO, normal saline at maintenance PPx: Lovenox  Disposition: Behavioral health  pending medical work-up  Subjective:  Patient sedated in bed.  Awakes to shaking, however when asked questions he speaks incomprehensibly.  He does not appear in any distress.  Objective: Temp:  [98.2 F (36.8 C)-98.8 F (37.1 C)] 98.8 F (37.1 C) (10/09 0426) Pulse Rate:  [72-95] 92 (10/09 0426) Resp:  [14-25] 16 (10/09 0426) BP: (112-135)/(70-87) 124/83 (10/09 0426) SpO2:  [95 %-100 %] 99 % (10/09 0426) Weight:  [111 kg-113.1 kg] 111 kg (10/09 0426) Physical Exam: General: Sleeping in bed, awakes to shaking.  Does not appear in distress Heart: Regular rate and rhythm with no murmurs appreciated Lungs: CTA bilaterally Abdomen: Bowel sounds present, no apparent nominal pain Skin: Warm and dry Extremities: Trace lower extremity edema    Laboratory: Recent Labs  Lab 02/20/19 1044 02/20/19 2246  WBC 10.6* 9.3  HGB 10.4* 10.7*  HCT 35.7* 34.4*  PLT 280 269   Recent Labs  Lab 02/20/19 1044 02/20/19 1309 02/20/19 2246 02/21/19 0321  NA 137  --  135 142  K 6.1* 6.1* 5.1 5.4*  CL 107  --  105 114*  CO2 21*  --  20* 21*  BUN 25*  --  22 23  CREATININE 1.94*  --  1.73* 1.51*  CALCIUM 9.1  --  9.0 9.1  PROT 7.2  --   --   --   BILITOT 0.4  --   --   --   ALKPHOS 60  --   --   --   ALT 16  --   --   --   AST 19  --   --   --   GLUCOSE 173*  --  296* 142*     Jackelyn Poling, DO 02/21/2019, 6:17 AM PGY-1, [facility] Family Medicine FPTS Intern pager: [phone], text pages welcome

## 2019-02-21 NOTE — Progress Notes (Signed)
New Admission Note: ? Arrival Method: via wheelchair Mental Orientation: A/O x , to self Telemetry: Box # 14 Assessment: Completed Skin: Refer to flowsheet IV: Right hand NSL Pain: none Tubes: Safety Measures: Safety Fall Prevention Plan discussed with patient. Suicide Precaution Protocol placed, Sitter at bedside. Admission:  [place] Orientation: Patient has been orientated to the room, unit and the staff. Family: Orders have been reviewed and are being implemented. Will continue to monitor the patient. Call light has been placed within reach and bed alarm has been activated.  ? [employer], [place]

## 2019-02-21 NOTE — Progress Notes (Signed)
FPTS Interim Progress Note  Patient with stat EKG.  RN was unable to scan EKG into the chart.  Dr. Rosita Fire and I were able to view EKG via printout.  EKG showing ventricular rate of 85 bpm, QTC 347.  Normal sinus rhythm.  No peak T waves.  Caroline More, DO 02/21/2019, 5:58 AM PGY-3, [facility] Medicine Service pager [phone]

## 2019-02-21 NOTE — Consult Note (Signed)
[facility] Face-to-Face Psychiatry Consult   Reason for Consult:  Hallucinations  Referring Physician:  Dr Mirian Mo Patient Identification: Dylan Jordan MRN:  [id number] Principal Diagnosis: hyperkalemia  Diagnosis:  Schizoaffective d/o, bipolar type  Total Time spent with patient: 1 hour  Subjective:   Dylan Jordan is a 63 y.o. male patient admitted with AMS and  Hallucinations.  Patient seen and evaluated in person by this provider.  Long history of paranoid schizophrenia, discharged from [facility] on 02/18/2019.  Came to the ED on 10/8 with medical issues.  On assessment he has some slurring of speech with some difficulty comprehending what he was communicating.  Per notes at [facility], he was transferred from [facility] for medical concerns.  Medication regiment adjusted there and Haldol Deconnate 400 mg given on 10/2.  Prior to this admission, he was on a couple of antipsychotics.  Patient continues to report hallucinations at times, visual of but like animals on the floor.  Denies auditory hallucinations.  No distress noted.    HPI per MD:  Briefly, 63 yo M with history of schizophrenia, diabetes, hyperlipidemia, gout presenting with AMS and hallucinations in setting of recent discharge from a prolonged psychiatric hospitalization one day prior to presentation.  MRI and CT do not demonstrate acute intracranial pathology. Suspect this is largely psychiatrically mediated and the hyperkalemia is an incidental finding (which needs to be treated, nonetheless).  Longstanding mild anemia noted - would get iron studies at some point to assess (whether inpatient or outpatient).  He did complain of vague groin discomfort to me though history is extremely limited by his psychiatric disease and garbled speech. He reports his R testicle has been larger than the L since the springtime. Could not really say whether he has dysuria (answer changed from moment to moment). He does urinate frequently and  takes flomax and finasteride. Exam notable for fairly marked tenderness of the posterior aspect of the  R testicle, without warmth, swelling, or erythema. (Exam performed with sitter Janine Limbo present in the room.). UA was unremarkable on admission. Will check gc/chlamydia in urine and obtain scrotal ultrasound.  Past Psychiatric History: schizophrenia  Risk to Self: Suicidal Ideation: No Suicidal Intent: No Is patient at risk for suicide?: No Suicidal Plan?: No Access to Means: No What has been your use of drugs/alcohol within the last 12 months?: NA How many times?: 0 Other Self Harm Risks: NA Triggers for Past Attempts: None known Intentional Self Injurious Behavior: None Risk to Others: Homicidal Ideation: No Thoughts of Harm to Others: No Current Homicidal Intent: No Current Homicidal Plan: No Access to Homicidal Means: No Identified Victim: NA History of harm to others?: No Assessment of Violence: None Noted Violent Behavior Description: NA Does patient have access to weapons?: No Criminal Charges Pending?: No Does patient have a court date: No Prior Inpatient Therapy: Prior Inpatient Therapy: Yes Prior Therapy Dates: 2020 Prior Therapy Facilty/Provider(s): [facility] Reason for Treatment: Schiziphrenia Prior Outpatient Therapy: Prior Outpatient Therapy: Yes Prior Therapy Dates: Current. Prior Therapy Facilty/Provider(s): [name].  Reason for Treatment: Medication management and counseling.  Does patient have an ACCT team?: No Does patient have Intensive In-House Services?  : No Does patient have [name] services? : Yes Does patient have P4CC services?: No  Past Medical History:  Past Medical History:  Diagnosis Date  . Anemia   . Asthma   . Diabetes mellitus without complication (HCC)   . Gout   . Hyperlipidemia   . Hyperosmolar hyperglycemic coma due to  diabetes mellitus without ketoacidosis (HCC)   . Hypertension   . Schizophrenia (HCC) 07/06/2018    Past  Surgical History:  Procedure Laterality Date  . REPLACEMENT TOTAL KNEE Left   . TOE SURGERY     Family History:  Family History  Adopted: Yes  Problem Relation Age of Onset  . Allergic rhinitis Neg Hx   . Angioedema Neg Hx   . Asthma Neg Hx   . Atopy Neg Hx   . Eczema Neg Hx   . Immunodeficiency Neg Hx   . Urticaria Neg Hx    Family Psychiatric  History: none Social History:  Social History   Substance and Sexual Activity  Alcohol Use No     Social History   Substance and Sexual Activity  Drug Use No    Social History   Socioeconomic History  . Marital status: Single    Spouse name: Not on file  . Number of children: Not on file  . Years of education: Not on file  . Highest education level: Not on file  Occupational History  . Not on file  Social Needs  . Financial resource strain: Somewhat hard  . Food insecurity    Worry: Sometimes true    Inability: Sometimes true  . Transportation needs    Medical: No    Non-medical: No  Tobacco Use  . Smoking status: Current Some Day Smoker    Packs/day: 0.30    Years: 45.00    Pack years: 13.50    Types: Cigarettes    Start date: 05/16/1971  . Smokeless tobacco: Never Used  . Tobacco comment: Decreased Intake to 10 cigarettes per week or less.  Substance and Sexual Activity  . Alcohol use: No  . Drug use: No  . Sexual activity: Not on file  Lifestyle  . Physical activity    Days per week: Not on file    Minutes per session: Not on file  . Stress: Not on file  Relationships  . Social Musician on phone: Not on file    Gets together: Not on file    Attends religious service: Not on file    Active member of club or organization: Not on file    Attends meetings of clubs or organizations: Not on file    Relationship status: Not on file  Other Topics Concern  . Not on file  Social History Narrative   Lives by himself at home. Closest family member is niece.       ETOH none   Drugs non e    Current smoker, none for three days    Additional Social History:    Allergies:   Allergies  Allergen Reactions  . Chantix [Varenicline Tartrate] Other (See Comments)    Nightmares  . Ativan [Lorazepam] Other (See Comments)    Agitation (and "5 others like it") Will need to call [facility] [place] (phone) [phone] and [phone] to fax a release-   . Shellfish-Derived Products Itching  . Penicillins Diarrhea    Has patient had a PCN reaction causing immediate rash, facial/tongue/throat swelling, SOB or lightheadedness with hypotension: No Has patient had a PCN reaction causing severe rash involving mucus membranes or skin necrosis: No Has patient had a PCN reaction that required hospitalization: No Has patient had a PCN reaction occurring within the last 10 years: No If all of the above answers are "NO", then may proceed with Cephalosporin use.     Labs:  Results for orders placed or performed during the hospital encounter of 02/20/19 (from the past 48 hour(s))  Comprehensive metabolic panel     Status: Abnormal   Collection Time: 02/20/19 10:44 AM  Result Value Ref Range   Sodium 137 135 - 145 mmol/L   Potassium 6.1 (H) 3.5 - 5.1 mmol/L   Chloride 107 98 - 111 mmol/L   CO2 21 (L) 22 - 32 mmol/L   Glucose, Bld 173 (H) 70 - 99 mg/dL   BUN 25 (H) 8 - 23 mg/dL   Creatinine, Ser 1.61 (H) 0.61 - 1.24 mg/dL   Calcium 9.1 8.9 - 09.6 mg/dL   Total Protein 7.2 6.5 - 8.1 g/dL   Albumin 3.0 (L) 3.5 - 5.0 g/dL   AST 19 15 - 41 U/L   ALT 16 0 - 44 U/L   Alkaline Phosphatase 60 38 - 126 U/L   Total Bilirubin 0.4 0.3 - 1.2 mg/dL   GFR calc non Af Amer 36 (L) >60 mL/min   GFR calc Af Amer 42 (L) >60 mL/min   Anion gap 9 5 - 15    Comment: Performed at [facility], [address] [place], [place] [address]  Ethanol     Status: None   Collection Time: 02/20/19 10:44 AM  Result Value Ref Range   Alcohol, Ethyl (B) <10 <10 mg/dL    Comment: (NOTE) Lowest  detectable limit for serum alcohol is 10 mg/dL. For medical purposes only. Performed at [facility] [place] [facility], [address], [place], [place] [address]   Salicylate level     Status: None   Collection Time: 02/20/19 10:44 AM  Result Value Ref Range   Salicylate Lvl <7.0 2.8 - 30.0 mg/dL    Comment: Performed at [facility], [address] [place], [place] [address]  Acetaminophen level     Status: Abnormal   Collection Time: 02/20/19 10:44 AM  Result Value Ref Range   Acetaminophen (Tylenol), Serum <10 (L) 10 - 30 ug/mL    Comment: (NOTE) Therapeutic concentrations vary significantly. A range of 10-30 ug/mL  may be an effective concentration for many patients. However, some  are best treated at concentrations outside of this range. Acetaminophen concentrations >150 ug/mL at 4 hours after ingestion  and >50 ug/mL at 12 hours after ingestion are often associated with  toxic reactions. Performed at [facility], [address] [place], [place] [address]   cbc     Status: Abnormal   Collection Time: 02/20/19 10:44 AM  Result Value Ref Range   WBC 10.6 (H) 4.0 - 10.5 K/uL   RBC 3.72 (L) 4.22 - 5.81 MIL/uL   Hemoglobin 10.4 (L) 13.0 - 17.0 g/dL   HCT 30.8 (L) 65.7 - 84.6 %   MCV 96.0 80.0 - 100.0 fL   MCH 28.0 26.0 - 34.0 pg   MCHC 29.1 (L) 30.0 - 36.0 g/dL   RDW 96.2 95.2 - 84.1 %   Platelets 280 150 - 400 K/uL   nRBC 0.0 0.0 - 0.2 %    Comment: Performed at [facility], [address] [place], [place] [address]  CBG monitoring, ED     Status: Abnormal   Collection Time: 02/20/19 12:56 PM  Result Value Ref Range   Glucose-Capillary 243 (H) 70 - 99 mg/dL   Comment 1 Notify RN    Comment 2 Document in Chart   Potassium     Status: Abnormal  Collection Time: 02/20/19  1:09 PM  Result Value Ref Range   Potassium 6.1 (H) 3.5 - 5.1 mmol/L    Comment: Performed at [facility], [address], [place], [place] [address]  Ammonia     Status: None    Collection Time: 02/20/19  1:29 PM  Result Value Ref Range   Ammonia 22 9 - 35 umol/L    Comment: Performed at [facility], [address], [place], [place] [address]  Lithium level     Status: Abnormal   Collection Time: 02/20/19  3:51 PM  Result Value Ref Range   Lithium Lvl 1.21 (H) 0.60 - 1.20 mmol/L    Comment: Performed at [facility], [address], [place], [place] [address]  Valproic acid level     Status: None   Collection Time: 02/20/19  3:51 PM  Result Value Ref Range   Valproic Acid Lvl 72 50.0 - 100.0 ug/mL    Comment: Performed at [facility], [address], [place], [place] [address]  Urinalysis, Routine w reflex microscopic     Status: Abnormal   Collection Time: 02/20/19  4:22 PM  Result Value Ref Range   Color, Urine STRAW (A) YELLOW   APPearance CLEAR CLEAR   Specific Gravity, Urine 1.004 (L) 1.005 - 1.030   pH 7.0 5.0 - 8.0   Glucose, UA NEGATIVE NEGATIVE mg/dL   Hgb urine dipstick NEGATIVE NEGATIVE   Bilirubin Urine NEGATIVE NEGATIVE   Ketones, ur NEGATIVE NEGATIVE mg/dL   Protein, ur NEGATIVE NEGATIVE mg/dL   Nitrite NEGATIVE NEGATIVE   Leukocytes,Ua NEGATIVE NEGATIVE    Comment: Performed at [facility], [address] [place], [place] [address]  Rapid urine drug screen (hospital performed)     Status: None   Collection Time: 02/20/19  4:30 PM  Result Value Ref Range   Opiates NONE DETECTED NONE DETECTED   Cocaine NONE DETECTED NONE DETECTED   Benzodiazepines NONE DETECTED NONE DETECTED   Amphetamines NONE DETECTED NONE DETECTED   Tetrahydrocannabinol NONE DETECTED NONE DETECTED   Barbiturates NONE DETECTED NONE DETECTED    Comment: (NOTE) DRUG SCREEN FOR MEDICAL PURPOSES ONLY.  IF CONFIRMATION IS NEEDED FOR ANY PURPOSE, NOTIFY LAB WITHIN 5 DAYS. LOWEST DETECTABLE LIMITS FOR URINE DRUG SCREEN Drug Class                     Cutoff (ng/mL) Amphetamine and metabolites    1000 Barbiturate and metabolites     200 Benzodiazepine                 200 Tricyclics and metabolites     300 Opiates and metabolites        300 Cocaine and metabolites        300 THC                            50 Performed at [facility] Lab, [address] [place], [place] [address]   SARS CORONAVIRUS 2 (TAT 6-24 HRS) Nasopharyngeal Nasopharyngeal Swab     Status: None   Collection Time: 02/20/19  7:34 PM   Specimen: Nasopharyngeal Swab  Result Value Ref Range   SARS Coronavirus 2 NEGATIVE NEGATIVE    Comment: (NOTE) SARS-CoV-2 target nucleic acids are NOT DETECTED. The SARS-CoV-2 RNA is generally detectable in upper and lower respiratory specimens during the acute phase of infection. Negative results  do not preclude SARS-CoV-2 infection, do not rule out co-infections with other pathogens, and should not be used as the sole basis for treatment or other patient management decisions. Negative results must be combined with clinical observations, patient history, and epidemiological information. The expected result is Negative. Fact Sheet for Patients: HairSlick.nohttps://www.fda.gov/media/138098/download Fact Sheet for Healthcare Providers: quierodirigir.comhttps://www.fda.gov/media/138095/download This test is not yet approved or cleared by the [place] FDA and  has been authorized for detection and/or diagnosis of SARS-CoV-2 by FDA under an Emergency Use Authorization (EUA). This EUA will remain  in effect (meaning this test can be used) for the duration of the COVID-19 declaration under Section 56 4(b)(1) of the Act, 21 U.S.C. section 360bbb-3(b)(1), unless the authorization is terminated or revoked sooner. Performed at [facility], [address] [place], [place] [address]   CBC     Status: Abnormal   Collection Time: 02/20/19 10:46 PM  Result Value Ref Range   WBC 9.3 4.0 - 10.5 K/uL   RBC 3.70 (L) 4.22 - 5.81 MIL/uL   Hemoglobin 10.7 (L) 13.0 - 17.0 g/dL   HCT 60.434.4 (L) 54.039.0 - 98.152.0 %   MCV 93.0 80.0 - 100.0 fL    MCH 28.9 26.0 - 34.0 pg   MCHC 31.1 30.0 - 36.0 g/dL   RDW 19.113.8 47.811.5 - 29.515.5 %   Platelets 269 150 - 400 K/uL   nRBC 0.0 0.0 - 0.2 %    Comment: Performed at [facility], [address] [place], [place] [address]  Basic metabolic panel     Status: Abnormal   Collection Time: 02/20/19 10:46 PM  Result Value Ref Range   Sodium 135 135 - 145 mmol/L   Potassium 5.1 3.5 - 5.1 mmol/L   Chloride 105 98 - 111 mmol/L   CO2 20 (L) 22 - 32 mmol/L   Glucose, Bld 296 (H) 70 - 99 mg/dL   BUN 22 8 - 23 mg/dL   Creatinine, Ser 8.651.73 (H) 0.61 - 1.24 mg/dL   Calcium 9.0 8.9 - 78.410.3 mg/dL   GFR calc non Af Amer 41 (L) >60 mL/min   GFR calc Af Amer 48 (L) >60 mL/min   Anion gap 10 5 - 15    Comment: Performed at [facility], [address] [place], [place] [address]  Hemoglobin A1c     Status: Abnormal   Collection Time: 02/20/19 10:46 PM  Result Value Ref Range   Hgb A1c MFr Bld 8.9 (H) 4.8 - 5.6 %    Comment: (NOTE) Pre diabetes:          5.7%-6.4% Diabetes:              >6.4% Glycemic control for   <7.0% adults with diabetes    Mean Plasma Glucose 208.73 mg/dL    Comment: Performed at [facility], [address] [place], [place] [address]  Glucose, capillary     Status: Abnormal   Collection Time: 02/20/19 11:58 PM  Result Value Ref Range   Glucose-Capillary 255 (H) 70 - 99 mg/dL  Basic metabolic panel     Status: Abnormal   Collection Time: 02/21/19  3:21 AM  Result Value Ref Range   Sodium 142 135 - 145 mmol/L   Potassium 5.4 (H) 3.5 - 5.1 mmol/L   Chloride 114 (H) 98 - 111 mmol/L   CO2 21 (L) 22 - 32 mmol/L   Glucose, Bld 142 (H) 70 - 99 mg/dL  BUN 23 8 - 23 mg/dL   Creatinine, Ser 0.86 (H) 0.61 - 1.24 mg/dL   Calcium 9.1 8.9 - 57.8 mg/dL   GFR calc non Af Amer 49 (L) >60 mL/min   GFR calc Af Amer 57 (L) >60 mL/min   Anion gap 7 5 - 15    Comment: Performed at [facility], [address] [place], [place] [address]  Glucose, capillary     Status:  Abnormal   Collection Time: 02/21/19  3:31 AM  Result Value Ref Range   Glucose-Capillary 129 (H) 70 - 99 mg/dL  Glucose, capillary     Status: None   Collection Time: 02/21/19  7:59 AM  Result Value Ref Range   Glucose-Capillary 75 70 - 99 mg/dL  Glucose, capillary     Status: Abnormal   Collection Time: 02/21/19 11:28 AM  Result Value Ref Range   Glucose-Capillary 147 (H) 70 - 99 mg/dL  Basic metabolic panel     Status: Abnormal   Collection Time: 02/21/19  2:44 PM  Result Value Ref Range   Sodium 146 (H) 135 - 145 mmol/L   Potassium 4.8 3.5 - 5.1 mmol/L   Chloride 114 (H) 98 - 111 mmol/L   CO2 22 22 - 32 mmol/L   Glucose, Bld 176 (H) 70 - 99 mg/dL   BUN 18 8 - 23 mg/dL   Creatinine, Ser 9.52 (H) 0.61 - 1.24 mg/dL   Calcium 9.5 8.9 - 84.1 mg/dL   GFR calc non Af Amer 46 (L) >60 mL/min   GFR calc Af Amer 54 (L) >60 mL/min   Anion gap 10 5 - 15    Comment: Performed at [facility], [address] [place], [place] [address]    Current Facility-Administered Medications  Medication Dose Route Frequency Provider Last Rate Last Dose  . 0.9 %  sodium chloride infusion   Intravenous Continuous Ellwood Dense, DO      . acetaminophen (TYLENOL) tablet 650 mg  650 mg Oral Q6H PRN Ellwood Dense, DO       Or  . acetaminophen (TYLENOL) suppository 650 mg  650 mg Rectal Q6H PRN Ellwood Dense, DO      . albuterol (PROVENTIL) (2.5 MG/3ML) 0.083% nebulizer solution 3 mL  3 mL Inhalation Q6H PRN Ellwood Dense, DO      . allopurinol (ZYLOPRIM) tablet 200 mg  200 mg Oral Daily Rumball, Alison, DO      . amLODipine (NORVASC) tablet 10 mg  10 mg Oral Daily Rumball, Alison, DO      . benztropine (COGENTIN) tablet 2 mg  2 mg Oral BID Ellwood Dense, DO   2 mg at 02/21/19 0006  . divalproex (DEPAKOTE) DR tablet 2,000 mg  2,000 mg Oral QPM Ellwood Dense, DO   2,000 mg at 02/21/19 0005  . divalproex (DEPAKOTE) DR tablet 500 mg  500 mg Oral q morning - 10a Rumball, Alison, DO      .  enoxaparin (LOVENOX) injection 40 mg  40 mg Subcutaneous Q24H Ellwood Dense, DO   40 mg at 02/21/19 0513  . famotidine (PEPCID) tablet 20 mg  20 mg Oral BID Ellwood Dense, DO   20 mg at 02/21/19 0005  . finasteride (PROSCAR) tablet 5 mg  5 mg Oral Daily Rumball, Alison, DO      . gabapentin (NEURONTIN) capsule 600 mg  600 mg Oral TID Ellwood Dense, DO   600 mg at 02/21/19 0005  . hydrOXYzine (ATARAX/VISTARIL) tablet  50 mg  50 mg Oral Q6H PRN Rory Percy, DO   50 mg at 02/21/19 0325  . insulin aspart (novoLOG) injection 0-9 Units  0-9 Units Subcutaneous Q4H Caroline More, DO   1 Units at 02/21/19 0421  . lithium carbonate capsule 300 mg  300 mg Oral BID WC Rumball, Alison, DO      . pantoprazole (PROTONIX) EC tablet 40 mg  40 mg Oral Daily Rumball, Alison, DO      . polyethylene glycol (MIRALAX / GLYCOLAX) packet 17 g  17 g Oral Daily PRN Rory Percy, DO      . tamsulosin (FLOMAX) capsule 0.4 mg  0.4 mg Oral QPC breakfast Rory Percy, DO        Musculoskeletal: Strength & Muscle Tone: within normal limits Gait & Station: normal Patient leans: N/A  Psychiatric Specialty Exam: Physical Exam  Nursing note and vitals reviewed. Constitutional: He is oriented to person, place, and time. He appears well-developed and well-nourished.  HENT:  Head: Normocephalic.  Neck: Normal range of motion.  Respiratory: Effort normal.  Musculoskeletal: Normal range of motion.  Neurological: He is alert and oriented to person, place, and time.  Psychiatric: His speech is normal. Judgment and thought content normal. His mood appears anxious. His affect is blunt. He is actively hallucinating. Cognition and memory are normal.    Review of Systems  Psychiatric/Behavioral: Positive for hallucinations. The patient is nervous/anxious.   All other systems reviewed and are negative.   Blood pressure 124/83, pulse 92, temperature 98.8 F (37.1 C), temperature source Oral, resp. rate 16, height 6'  1" (1.854 m), weight 111 kg, SpO2 99 %.Body mass index is 32.29 kg/m.  General Appearance: Casual  Eye Contact:  Good  Speech:  Slurred  Volume:  Normal  Mood:  Anxious  Affect:  Blunt  Thought Process:  Coherent and Descriptions of Associations: Intact  Orientation:  Full (Time, Place, and Person)  Thought Content:  Hallucinations: Visual  Suicidal Thoughts:  No  Homicidal Thoughts:  No  Memory:  Immediate;   Fair Recent;   Fair Remote;   Fair  Judgement:  Fair  Insight:  Fair  Psychomotor Activity:  Normal  Concentration:  Concentration: Fair and Attention Span: Fair  Recall:  [employer] of Knowledge:  Fair  Language:  Fair  Akathisia:  No  Handed:  Right  AIMS (if indicated):     Assets:  Housing Leisure Time Physical Health Resilience  ADL's:  Intact  Cognition:  WNL  Sleep:       Treatment Plan Summary: Schizoaffective disorder, bipolar type: -Continue Depakote 500 mg in the and 2000 mg in the pm -Ordered Valproic acid level -Continue Lithium 300 mg BID -Ordered Lithium level -Recommend starting Haldol 0.5 mg BID  EPS: -Recommend decreasing Cogentin 2 mg BID to 1 mg BID  Anxiety: -Continue gabapentin 600 mg TID -Recommend decreasing hydroxyzine 50 mg every six hours PRN to 25 mg every six hours PRN  Disposition: No evidence of imminent risk to self or others at present.    Waylan Boga, NP 02/21/2019 3:15 PM

## 2019-02-21 NOTE — Progress Notes (Signed)
Spoke with social worker Event organiser.  Explained that [name] is not yet medically stable for discharge.  We are continuing to diagnose and treat his elevated potassium.  Will discuss IVC with attending.  Matilde Haymaker, MD

## 2019-02-22 DIAGNOSIS — K219 Gastro-esophageal reflux disease without esophagitis: Secondary | ICD-10-CM | POA: Diagnosis present

## 2019-02-22 DIAGNOSIS — R443 Hallucinations, unspecified: Secondary | ICD-10-CM | POA: Diagnosis present

## 2019-02-22 DIAGNOSIS — N1831 Chronic kidney disease, stage 3a: Secondary | ICD-10-CM | POA: Diagnosis present

## 2019-02-22 DIAGNOSIS — F250 Schizoaffective disorder, bipolar type: Secondary | ICD-10-CM

## 2019-02-22 DIAGNOSIS — Z880 Allergy status to penicillin: Secondary | ICD-10-CM | POA: Diagnosis not present

## 2019-02-22 DIAGNOSIS — E875 Hyperkalemia: Secondary | ICD-10-CM | POA: Diagnosis present

## 2019-02-22 DIAGNOSIS — I129 Hypertensive chronic kidney disease with stage 1 through stage 4 chronic kidney disease, or unspecified chronic kidney disease: Secondary | ICD-10-CM | POA: Diagnosis present

## 2019-02-22 DIAGNOSIS — R4182 Altered mental status, unspecified: Secondary | ICD-10-CM | POA: Diagnosis not present

## 2019-02-22 DIAGNOSIS — M1A9XX0 Chronic gout, unspecified, without tophus (tophi): Secondary | ICD-10-CM | POA: Diagnosis present

## 2019-02-22 DIAGNOSIS — Z91013 Allergy to seafood: Secondary | ICD-10-CM | POA: Diagnosis not present

## 2019-02-22 DIAGNOSIS — Z79899 Other long term (current) drug therapy: Secondary | ICD-10-CM | POA: Diagnosis not present

## 2019-02-22 DIAGNOSIS — N433 Hydrocele, unspecified: Secondary | ICD-10-CM | POA: Diagnosis present

## 2019-02-22 DIAGNOSIS — F17210 Nicotine dependence, cigarettes, uncomplicated: Secondary | ICD-10-CM | POA: Diagnosis present

## 2019-02-22 DIAGNOSIS — E119 Type 2 diabetes mellitus without complications: Secondary | ICD-10-CM | POA: Diagnosis not present

## 2019-02-22 DIAGNOSIS — E1165 Type 2 diabetes mellitus with hyperglycemia: Secondary | ICD-10-CM | POA: Diagnosis present

## 2019-02-22 DIAGNOSIS — J4530 Mild persistent asthma, uncomplicated: Secondary | ICD-10-CM | POA: Diagnosis present

## 2019-02-22 DIAGNOSIS — N401 Enlarged prostate with lower urinary tract symptoms: Secondary | ICD-10-CM | POA: Diagnosis present

## 2019-02-22 DIAGNOSIS — F200 Paranoid schizophrenia: Secondary | ICD-10-CM | POA: Diagnosis not present

## 2019-02-22 DIAGNOSIS — R441 Visual hallucinations: Secondary | ICD-10-CM | POA: Diagnosis not present

## 2019-02-22 DIAGNOSIS — Z20828 Contact with and (suspected) exposure to other viral communicable diseases: Secondary | ICD-10-CM | POA: Diagnosis present

## 2019-02-22 DIAGNOSIS — E1142 Type 2 diabetes mellitus with diabetic polyneuropathy: Secondary | ICD-10-CM | POA: Diagnosis present

## 2019-02-22 DIAGNOSIS — K5900 Constipation, unspecified: Secondary | ICD-10-CM | POA: Diagnosis present

## 2019-02-22 DIAGNOSIS — Z96652 Presence of left artificial knee joint: Secondary | ICD-10-CM | POA: Diagnosis present

## 2019-02-22 DIAGNOSIS — F419 Anxiety disorder, unspecified: Secondary | ICD-10-CM | POA: Diagnosis not present

## 2019-02-22 DIAGNOSIS — E785 Hyperlipidemia, unspecified: Secondary | ICD-10-CM | POA: Diagnosis present

## 2019-02-22 DIAGNOSIS — N3946 Mixed incontinence: Secondary | ICD-10-CM | POA: Diagnosis present

## 2019-02-22 DIAGNOSIS — E1122 Type 2 diabetes mellitus with diabetic chronic kidney disease: Secondary | ICD-10-CM | POA: Diagnosis present

## 2019-02-22 DIAGNOSIS — N179 Acute kidney failure, unspecified: Secondary | ICD-10-CM | POA: Diagnosis present

## 2019-02-22 DIAGNOSIS — Z794 Long term (current) use of insulin: Secondary | ICD-10-CM | POA: Diagnosis not present

## 2019-02-22 LAB — BASIC METABOLIC PANEL
Anion gap: 8 (ref 5–15)
Anion gap: 7 (ref 5–15)
Anion gap: 6 (ref 5–15)
BUN: 17 mg/dL (ref 8–23)
BUN: 16 mg/dL (ref 8–23)
BUN: 14 mg/dL (ref 8–23)
CO2: 19 mmol/L — ABNORMAL LOW (ref 22–32)
CO2: 18 mmol/L — ABNORMAL LOW (ref 22–32)
CO2: 19 mmol/L — ABNORMAL LOW (ref 22–32)
Calcium: 8.8 mg/dL — ABNORMAL LOW (ref 8.9–10.3)
Calcium: 9 mg/dL (ref 8.9–10.3)
Calcium: 8.8 mg/dL — ABNORMAL LOW (ref 8.9–10.3)
Chloride: 115 mmol/L — ABNORMAL HIGH (ref 98–111)
Chloride: 117 mmol/L — ABNORMAL HIGH (ref 98–111)
Chloride: 116 mmol/L — ABNORMAL HIGH (ref 98–111)
Creatinine, Ser: 1.57 mg/dL — ABNORMAL HIGH (ref 0.61–1.24)
Creatinine, Ser: 1.45 mg/dL — ABNORMAL HIGH (ref 0.61–1.24)
Creatinine, Ser: 1.43 mg/dL — ABNORMAL HIGH (ref 0.61–1.24)
GFR calc Af Amer: 54 mL/min — ABNORMAL LOW (ref >60)
GFR calc Af Amer: 59 mL/min — ABNORMAL LOW (ref >60)
GFR calc Af Amer: >60 mL/min (ref >60)
GFR calc non Af Amer: 47 mL/min — ABNORMAL LOW (ref >60)
GFR calc non Af Amer: 51 mL/min — ABNORMAL LOW (ref >60)
GFR calc non Af Amer: 52 mL/min — ABNORMAL LOW (ref >60)
Glucose, Bld: 349 mg/dL — ABNORMAL HIGH (ref 70–99)
Glucose, Bld: 249 mg/dL — ABNORMAL HIGH (ref 70–99)
Glucose, Bld: 331 mg/dL — ABNORMAL HIGH (ref 70–99)
Potassium: 4.8 mmol/L (ref 3.5–5.1)
Potassium: 5.1 mmol/L (ref 3.5–5.1)
Potassium: 5 mmol/L (ref 3.5–5.1)
Sodium: 142 mmol/L (ref 135–145)
Sodium: 142 mmol/L (ref 135–145)
Sodium: 141 mmol/L (ref 135–145)

## 2019-02-22 LAB — GLUCOSE, CAPILLARY
Glucose-Capillary: 106 mg/dL — ABNORMAL HIGH (ref 70–99)
Glucose-Capillary: 131 mg/dL — ABNORMAL HIGH (ref 70–99)
Glucose-Capillary: 149 mg/dL — ABNORMAL HIGH (ref 70–99)
Glucose-Capillary: 233 mg/dL — ABNORMAL HIGH (ref 70–99)
Glucose-Capillary: 252 mg/dL — ABNORMAL HIGH (ref 70–99)
Glucose-Capillary: 311 mg/dL — ABNORMAL HIGH (ref 70–99)
Glucose-Capillary: 48 mg/dL — ABNORMAL LOW (ref 70–99)
Glucose-Capillary: 77 mg/dL (ref 70–99)

## 2019-02-22 MED ORDER — SODIUM ZIRCONIUM CYCLOSILICATE 10 G PO PACK
10.0000 g | PACK | Freq: Every day | ORAL | Status: DC
  Administered 2019-02-22: 10 g via ORAL
  Filled 2019-02-22: qty 1

## 2019-02-22 MED ORDER — INSULIN ASPART 100 UNIT/ML ~~LOC~~ SOLN
0.0000 [IU] | Freq: Three times a day (TID) | SUBCUTANEOUS | Status: DC
  Administered 2019-02-22: 5 [IU] via SUBCUTANEOUS
  Administered 2019-02-23: 3 [IU] via SUBCUTANEOUS
  Administered 2019-02-23: 5 [IU] via SUBCUTANEOUS
  Administered 2019-02-23: 2 [IU] via SUBCUTANEOUS
  Administered 2019-02-24: 1 [IU] via SUBCUTANEOUS
  Administered 2019-02-24: 2 [IU] via SUBCUTANEOUS
  Administered 2019-02-24: 1 [IU] via SUBCUTANEOUS
  Administered 2019-02-25 (×2): 2 [IU] via SUBCUTANEOUS
  Administered 2019-02-25: 5 [IU] via SUBCUTANEOUS
  Administered 2019-02-26: 2 [IU] via SUBCUTANEOUS
  Administered 2019-02-26: 5 [IU] via SUBCUTANEOUS
  Administered 2019-02-27: 3 [IU] via SUBCUTANEOUS
  Administered 2019-02-27: 5 [IU] via SUBCUTANEOUS
  Administered 2019-02-28 – 2019-03-02 (×5): 3 [IU] via SUBCUTANEOUS
  Administered 2019-03-02 – 2019-03-03 (×4): 1 [IU] via SUBCUTANEOUS
  Administered 2019-03-03: 5 [IU] via SUBCUTANEOUS
  Administered 2019-03-04 (×2): 2 [IU] via SUBCUTANEOUS
  Administered 2019-03-04: 1 [IU] via SUBCUTANEOUS
  Administered 2019-03-05: 2 [IU] via SUBCUTANEOUS
  Administered 2019-03-05: 1 [IU] via SUBCUTANEOUS
  Administered 2019-03-05: 3 [IU] via SUBCUTANEOUS
  Administered 2019-03-06 (×2): 2 [IU] via SUBCUTANEOUS
  Administered 2019-03-06: 3 [IU] via SUBCUTANEOUS
  Administered 2019-03-07: 2 [IU] via SUBCUTANEOUS
  Administered 2019-03-07: 3 [IU] via SUBCUTANEOUS

## 2019-02-22 NOTE — Discharge Instructions (Addendum)
Discharge Instructions:  - Please follow up with your PCP within one week for recheck, including new medications such as your blood pressure medicine - Please return if you experience similar symptoms, high fevers, confusion, chest pain that does not subside within 15 minutes or shortness of breath.   An ultrasound of your scrotum did find a complex cyst.  This is likely benign but it is recommended that you follow-up for another testicular ultrasound in 6 months.   Follow up with [facility] for your mental health care:  Haldol dec 400 mg injection due on 03/17/19  Mental health service in [place]: [email or web address] Get online care: [email or web address] Address: [address], [place], [place] [address] Hours:  Open ? Closes 5PM Phone: [phone]

## 2019-02-22 NOTE — Progress Notes (Signed)
[name] had to complete IVC paperwork, [name] faxed to the magistrates office and will call and have the patient served.   Domenic Schwab, MSW, [place] Worker [facility]  [phone]

## 2019-02-22 NOTE — Progress Notes (Addendum)
Family Medicine Teaching Service Daily Progress Note Intern Pager: [phone]  Patient name: Dylan Jordan Medical record number: [id number] Date of birth: 01/20/1956 Age: 63 y.o. Gender: male  Primary Care Provider: Shirley, [place], DO Consultants: Psych Code Status: Full  Pt Overview and Major Events to Date:  10/8-admitted  Assessment and Plan: Dylan Jordan is a 63 y.o. male presenting with hallucinations and elevated potassium. PMH is significant for schizophrenia, type 2 diabetes, CKD, hypertension, hyperlipidemia, Hx alcohol polysubstance abuse.  Hyperkalemia-resolved Most recent potassium 4.8 on 10/10.  Was 6.1 when patient was admitted, confirmed on recheck.  Etiology unknown.  Altered mental status likely secondary to psychiatric medication changes. MRI brain: Exam did have to be discontinued prior to completion due to lack of patient cooperation.  No evidence of acute or cranial abnormality.  Mild cerebral atrophy.   -Psych on board, appreciate recommendations as follows:  -Continue Depakote 500 mg in the a.m., 2000 mg in p.m.  -Check valproic acid level  -Lithium 300 mg twice daily  -Recheck lithium level 4 tomorrow 10/11  -Recommend starting Haldol 0.5 mg twice daily  -Recommend decreasing Cogentin 2 mg twice daily to 1 mg twice daily  -Continue gabapentin 600 mg 3 times daily  -Recommend decreasing hydralazine 50 mg every 6 hours as needed to 25 mg every 6 hours as needed. - 1:1 sitter - NPO until mental status clears -Start normal saline at maintenance as patient is n.p.o.  Schizophrenia Patient recently discharged from behavioral health hospital after staying there for approximately 1.5 months.  Patient home meds include Depakote, lithium, benztropine.  Lithium level of 1.21.  CT head negative for acute intracranial pathology. -Psych on board, recommendations as above  Anxiety Home medications include hydroxyzine -Hydralazine 25 mg every 6 hours as  needed  Type 2 diabetes Home medications include Lantus 45 units.  On gabapentin for neuropathy in his feet. -Sensitive sliding scale  - hold lantus given NPO  CKD Current creatinine 1.57.  Baseline appears to be somewhere between 1.2-1.3. Trace LE edema. -Monitor fluid status  Hypertension Currently normotensive. Home medications include amlodipine 10 mg -Continue amlodipine  Hyperlipidemia Medications include atorvastatin 20 mg -Continue atorvastatin  History of alcohol/polysubstance abuse Patient only one day out of a 1.26-month hospital stay at behavioral health.  Not concerned for any withdrawal at this point. -Recommend cessation to patient during discharge  GERD All medications include famotidine 20 mg, Protonix 40 mg. -Continue home Protonix.  Gout Patient on allopurinol at home medication -Continue home medications  Constipation Patient stated that he was able to have 2 large bowel movements today and that he feels much better after doing that.  Home medications include polyethylene glycol 17 mg -Continue MiraLAX  BPH With mixed incontinence. Home medications include finasteride, tamsulosin -Continue home medications  FEN/GI: NPO, normal saline at maintenance PPx: Lovenox  Disposition: Medically stable for discharge, pending psych work-up and placement.  Subjective:  Dylan Jordan was much improved today from a verbal standpoint.  I was able to have conversation with him and discuss the reason he was in the hospital and his current care so far.  He verbalized understanding that his potassium level was very elevated and that we work on reducing this.  He also verbalized understanding that we were currently working on adjusting some psychiatric medications in order to balance side effects with benefits.  Patient was alert and oriented to self and place, when asked what year it was he stated he thought it was 2016 but was  not sure.  Patient stated he was also  able to have 2 large bowel movements today and that he feels much better after this.  He states that he tends to be constipated and is not sure when his last bowel movement was.  Objective: Temp:  [98.3 F (36.8 C)-98.5 F (36.9 C)] 98.5 F (36.9 C) (10/10 0433) Pulse Rate:  [76-88] 76 (10/10 0433) Resp:  [18] 18 (10/10 0433) BP: (112-141)/(66-87) 112/66 (10/10 0433) SpO2:  [97 %-99 %] 98 % (10/10 0433) Physical Exam: General: Alert and oriented to person and place, not appearing in any distress. Heart: Regular rate and rhythm, no murmurs Lungs: CTA bilaterally Abdomen: Bowel sounds present, no abdominal pain Skin: Warm and dry  Laboratory: Recent Labs  Lab 02/20/19 1044 02/20/19 2246  WBC 10.6* 9.3  HGB 10.4* 10.7*  HCT 35.7* 34.4*  PLT 280 269   Recent Labs  Lab 02/20/19 1044  02/21/19 1822 02/21/19 2310 02/22/19 0322  NA 137   < > 142 145 142  K 6.1*   < > 5.3* 4.9 4.8  CL 107   < > 114* 116* 115*  CO2 21*   < > 20* 22 19*  BUN 25*   < > 18 17 17   CREATININE 1.94*   < > 1.56* 1.52* 1.57*  CALCIUM 9.1   < > 9.2 9.1 8.8*  PROT 7.2  --   --   --   --   BILITOT 0.4  --   --   --   --   ALKPHOS 60  --   --   --   --   ALT 16  --   --   --   --   AST 19  --   --   --   --   GLUCOSE 173*   < > 171* 140* 349*   < > = values in this interval not displayed.     Lurline Del, DO 02/22/2019, 6:45 AM PGY-1, [place] Intern pager: [phone], text pages welcome

## 2019-02-22 NOTE — Progress Notes (Signed)
I went to evaluate Dylan Jordan after speaking with social work.  Patient was alert and oriented only to self.  He was not sure where he was at, how he got here, and when told he was in the hospital and asked about his hospital course he did not know, even though I had a very reasonable conversation with him regarding this earlier today.  He stated that he had just moved down from [place] and did not know this area very well and that is why he was not sure where he was or what facility he was in.  When asked his thoughts about placement at his previous facility [facility], he expressed paranoia that the staff and other residents there were out to get him.  It is my medical opinion that he is not able to make rational decisions for himself in his current state.  Will sign IVC paperwork for placement at his previous facility [facility].  Lurline Del, DO

## 2019-02-22 NOTE — Discharge Summary (Addendum)
[facility] Discharge Summary  Patient name: Dylan Jordan Medical record number: [id number] Date of birth: March 04, 1956 Age: 63 y.o. Gender: male Date of Admission: 02/20/2019  Date of Discharge: 03/29/2019 Admitting Physician: Leeanne Rio, MD  Primary Care Provider: Shirley, [place], DO Consultants: Psychiatry  Indication for Hospitalization: hyperkalemia   Discharge Diagnoses/Problem List:  Active Problems:   Type 2 diabetes mellitus without complication, with long-term current use of insulin (HCC)   Hallucinations   Schizoaffective disorder, bipolar type ([place])   Hyperkalemia  Disposition: ALF  Discharge Condition: stable   Discharge Exam:  General: pleasant gentleman able to hold a conversation, sitting comfortably in bedside chair CV: RRR, no MRG Lungs: CTAB with normal WOB on room air Extremities: warm and well perfused, moves all extremities spontaneously Neuro: alert and oriented x 2, no focal deficits  Taken from Dr. Maudie Flakes note on day of discharge  [facility] Course:   Hyperkalemia  Patient was originally admitted into the emergency department due to audiovisual hallucinations and found to have a potassium level elevated at 6.1. Patient treated w/ Lokelma and K+ resolved to a normal level.   AMS 2/2 to Psychiatric Medication Changes, Schizophrenia  Patient's oral haldol prescription was discontinued, and patient discharged on Zyprexa 57m. By the time pt was medically stable for inpatient psychiatric admission, no beds were available. Upon repeat consultation with psychiatry, he did not meet inpatient psychiatric eligibility, plan to return [facility] but pt lacked capacity to make this decision. IVC completed and SW assisted in sorting placement for pt at ALF.  Patient difficult to place and in the interim was reevaluated by psychiatry on 11/6 and medication adjustments were made including starting  Haldol 5 mg twice daily, discontinuing olanzapine 10 mg daily, decreased Cogentin from 1 mg twice daily to 1 mg daily, with Depakote and lithium remaining unchanged.  Haldol was further decreased to 5 mg QHS, and lithium was halved to 150 mg BID after losartan was started for blood pressure control since losartan can cause lithium levels to increase.  A lithium level was drawn on 11/14 in order to have a new baseline.  He will also continue to need Haldol decanoate injections of 400 mg q30d, next dose due on 04/16/2019.  Testicular Pain, resolved Mr. CCipollonecomplained of testicular pain and underwent an ultrasound on 02/21/19 that showed large bilateral hydroceles and a 6 mm complex cyst within the right testicle. It was recommended that the patient have a repeat testicular ultrasound in 6 months to monitor for resolution or change of this cyst.   T2DM with diabetic neuropathy  Patient was continued on home gabapentin. Diabetes was managed with lantus and novolog. Patient discharged on Lantus 35 U daily.   CKD stage III Patient's baseline cr 1.3-1.5. Patient's Cr was elevated to 1.77 during admission, but at discharge was w/in baseline of 1.48. Recommend monitoring.  HLD  Patient was re-started on Atrovastatin 226mthroughout this admission.   Issues for Follow Up:  1. Testicular ultrasound with right scrotal complex cyst, recommend repeat testicular ultrasound in 6 months to look for interval change. 2. Recommend a repeat BMP to monitor patient's creatinine level.  3. Patient will need monthly injections of Haldol. Next dose is 04/16/2019. 4. Continue to monitor lithium levels after lithium was reduced following addition of losartan.  Significant Procedures: none   Significant Labs and Imaging:  No results for input(s): WBC, HGB, HCT, PLT in the last 168 hours. Recent  Labs  Lab 03/22/19 0456 03/26/19 0916  NA 140 138  K 4.2 5.0  CL 110 108  CO2 22 21*  GLUCOSE 143* 304*  BUN 26* 23   CREATININE 1.72* 1.59*  CALCIUM 9.2 9.5    No results found.   Results/Tests Pending at Time of Discharge: none  Discharge Medications:  Allergies as of 03/28/2019      Reactions   Chantix [varenicline Tartrate] Other (See Comments)   Nightmares   Ativan [lorazepam] Other (See Comments)   Agitation (and "5 others like it") Will need to call [facility] [place] (phone) [phone] and [phone] to fax a release-   Shellfish-derived Products Itching   Penicillins Diarrhea   Has patient had a PCN reaction causing immediate rash, facial/tongue/throat swelling, SOB or lightheadedness with hypotension: No Has patient had a PCN reaction causing severe rash involving mucus membranes or skin necrosis: No Has patient had a PCN reaction that required hospitalization: No Has patient had a PCN reaction occurring within the last 10 years: No If all of the above answers are "NO", then may proceed with Cephalosporin use.      Medication List    STOP taking these medications   insulin lispro 100 UNIT/ML injection Commonly known as: HUMALOG   pantoprazole 40 MG tablet Commonly known as: PROTONIX     TAKE these medications   Accu-Chek Aviva Plus w/Device Kit Use to check blood sugar three times daily or as directed.   Accu-Chek Softclix Lancets lancets 1 each by Other route 3 (three) times daily.   albuterol 108 (90 Base) MCG/ACT inhaler Commonly known as: VENTOLIN HFA Inhale 1-2 puffs into the lungs every 6 (six) hours as needed for wheezing or shortness of breath.   allopurinol 100 MG tablet Commonly known as: ZYLOPRIM Take 2 tablets (200 mg total) by mouth daily.   amLODipine 10 MG tablet Commonly known as: NORVASC Take 10 mg by mouth daily.   atorvastatin 20 MG tablet Commonly known as: LIPITOR Take 1 tablet (20 mg total) by mouth daily at 6 PM.   benztropine 1 MG tablet Commonly known as: COGENTIN Take 1 tablet (1 mg total) by mouth daily. What  changed:   medication strength  how much to take  when to take this   divalproex 500 MG DR tablet Commonly known as: DEPAKOTE Take 4 tablets (2,000 mg total) by mouth every evening. What changed: You were already taking a medication with the same name, and this prescription was added. Make sure you understand how and when to take each.   divalproex 500 MG DR tablet Commonly known as: DEPAKOTE Take 1 tablet (500 mg total) by mouth every morning. Start taking on: March 29, 2019 What changed:   how much to take  when to take this  additional instructions   famotidine 20 MG tablet Commonly known as: PEPCID Take 20 mg by mouth 2 (two) times daily.   finasteride 5 MG tablet Commonly known as: PROSCAR Take 1 tablet (5 mg total) by mouth daily. Start taking on: March 29, 2019   gabapentin 300 MG capsule Commonly known as: NEURONTIN Take 2 capsules (600 mg total) by mouth 3 (three) times daily.   glucose blood test strip Commonly known as: Accu-Chek Aviva Plus Dispense QS for twice daily testing.   haloperidol 5 MG tablet Commonly known as: HALDOL Take 1 tablet (5 mg total) by mouth at bedtime.   hydrOXYzine 25 MG tablet Commonly known as: ATARAX/VISTARIL Take 1  tablet (25 mg total) by mouth 2 (two) times daily as needed for anxiety. What changed:   medication strength  how much to take  when to take this   Lantus SoloStar 100 UNIT/ML Solostar Pen Generic drug: Insulin Glargine Inject 35 Units into the skin daily. What changed: how much to take   lithium carbonate 150 MG capsule Take 1 capsule (150 mg total) by mouth 2 (two) times daily with a meal. What changed:   medication strength  how much to take   losartan 25 MG tablet Commonly known as: COZAAR Take 1 tablet (25 mg total) by mouth at bedtime.   metformin 1000 MG (OSM) 24 hr tablet Commonly known as: FORTAMET Take 1 tablet (1,000 mg total) by mouth daily with breakfast. Start taking on:  March 29, 2019   NovoFine Plus 32G X 4 MM Misc Generic drug: Insulin Pen Needle 1 Container by Does not apply route daily. What changed: Another medication with the same name was added. Make sure you understand how and when to take each.   BD Pen Needle Nano U/F 32G X 4 MM Misc Generic drug: Insulin Pen Needle Inject 1 applicator as directed 2 (two) times daily. What changed: Another medication with the same name was added. Make sure you understand how and when to take each.   Pen Needles 31G X 8 MM Misc Use as directed What changed: You were already taking a medication with the same name, and this prescription was added. Make sure you understand how and when to take each.   polyethylene glycol 17 g packet Commonly known as: MIRALAX / GLYCOLAX Take 17 g by mouth daily as needed.   senna 8.6 MG Tabs tablet Commonly known as: SENOKOT Take 1 tablet (8.6 mg total) by mouth daily as needed for mild constipation.   tamsulosin 0.4 MG Caps capsule Commonly known as: FLOMAX Take 1 capsule (0.4 mg total) by mouth daily after breakfast. Start taking on: March 29, 2019 What changed:   how much to take  when to take this       Discharge Instructions: Please refer to Patient Instructions section of EMR for full details.  Patient was counseled important signs and symptoms that should prompt return to medical care, changes in medications, dietary instructions, activity restrictions, and follow up appointments.   Follow-Up Appointments:   Kathrene Alu, MD 03/28/2019, 10:39 PM PGY-3, [facility]

## 2019-02-22 NOTE — Progress Notes (Signed)
[name] is following for psych placement. [name] called MD to determine if the patient was capable of making his own decisions versus needing to be IVC.   [name] is awaiting a return phone call regarding MD's decision. [name] has re-faxed referral for the patient to [facility].   [name] will continue to follow and assist with discharge planning.   Domenic Schwab, MSW, [place] Worker [facility]  [phone]

## 2019-02-23 DIAGNOSIS — F250 Schizoaffective disorder, bipolar type: Secondary | ICD-10-CM | POA: Diagnosis not present

## 2019-02-23 DIAGNOSIS — F200 Paranoid schizophrenia: Secondary | ICD-10-CM | POA: Diagnosis not present

## 2019-02-23 DIAGNOSIS — R443 Hallucinations, unspecified: Secondary | ICD-10-CM | POA: Diagnosis not present

## 2019-02-23 LAB — BASIC METABOLIC PANEL
Anion gap: 8 (ref 5–15)
BUN: 12 mg/dL (ref 8–23)
CO2: 17 mmol/L — ABNORMAL LOW (ref 22–32)
Calcium: 8.7 mg/dL — ABNORMAL LOW (ref 8.9–10.3)
Chloride: 116 mmol/L — ABNORMAL HIGH (ref 98–111)
Creatinine, Ser: 1.41 mg/dL — ABNORMAL HIGH (ref 0.61–1.24)
GFR calc Af Amer: >60 mL/min (ref >60)
GFR calc non Af Amer: 53 mL/min — ABNORMAL LOW (ref >60)
Glucose, Bld: 364 mg/dL — ABNORMAL HIGH (ref 70–99)
Potassium: 4.6 mmol/L (ref 3.5–5.1)
Sodium: 141 mmol/L (ref 135–145)

## 2019-02-23 LAB — GLUCOSE, CAPILLARY
Glucose-Capillary: 299 mg/dL — ABNORMAL HIGH (ref 70–99)
Glucose-Capillary: 179 mg/dL — ABNORMAL HIGH (ref 70–99)
Glucose-Capillary: 240 mg/dL — ABNORMAL HIGH (ref 70–99)
Glucose-Capillary: 171 mg/dL — ABNORMAL HIGH (ref 70–99)

## 2019-02-23 LAB — LITHIUM LEVEL: Lithium Lvl: 0.58 mmol/L — ABNORMAL LOW (ref 0.60–1.20)

## 2019-02-23 NOTE — Progress Notes (Addendum)
Family Medicine Teaching Service Daily Progress Note Intern Pager: [phone]  Patient name: Dylan Jordan Medical record number: [id number] Date of birth: 1955/10/17 Age: 63 y.o. Gender: male  Primary Care Provider: Marysol Wellnitz, [place], DO Consultants: Psych Code Status: Full  Pt Overview and Major Events to Date:  10/8-admitted  Assessment and Plan: Dylan Jordan is a 63 y.o. male presenting with hallucinations and elevated potassium. PMH is significant for schizophrenia, type 2 diabetes, CKD, hypertension, hyperlipidemia, Hx alcohol polysubstance abuse.  AMS 2/2 psychiatric medications  schizophrenia Patient recently discharged from behavioral health hospital after staying there for approximately 1.5 months. Patient currently IVC'd for placement at his previous facility [facility]. MRI brain grossly normal here. Patient currently hallucinating, is not harm to himself or others. Currently on Cogentin, Depakote, Haldol, lithium. Lithium level of 0.58. -Psychiatry following, appreciate recommendations -Continue 1:1 sitter -Discontinued NS at 150 cc/hour given that patient has had adequate output and appears  -.Ensure adequate p.o.  Hyperkalemia-resolved Most recent potassium 4.6 on 10/11.  Was 6.1 when patient was admitted, confirmed on recheck.  Etiology unknown, s/p Lokelma 30mg . No EKG changes noted during hospitalization. -Monitor on BMP daily  Anxiety Home medications include hydroxyzine -Hydroxyzine decreased to 25 mg every 6 hours as needed  Type 2 diabetes with peripheral neuropathy Home medications include Lantus 45 units.  On gabapentin for neuropathy in his feet. -Sensitive sliding scale  -Hold Lantus while NPO -Continue gabapentin 600 mg 3 times daily  CKD stage III Current creatinine 1.41.  Baseline appears to be ~1.3-1.5.  -Trend BMP / urinary output -Replace electrolytes as indicated -Avoid nephrotoxic agents, ensure adequate renal  perfusion  Hypertension Currently normotensive. Home medications include amlodipine 10 mg -Continue amlodipine  Hyperlipidemia Medications include atorvastatin 20 mg -Continue atorvastatin  History of alcohol/polysubstance abuse, stable Patient with recent 1.70-month hospital stay at behavioral health.  Not concerned for any withdrawal at this point. -Recommend cessation to patient during discharge  GERD, stable All medications include famotidine 20 mg, Protonix 40 mg. -Continue home Protonix.  Gout, stable Patient on allopurinol at home medication -Continue home medications  Constipation-improved -Continue MiraLAX  BPH, stable With mixed incontinence. Home medications include finasteride, tamsulosin -Continue home medications  FEN/GI: Heart healthy/carb modified PPx: Lovenox  Disposition: IVC in place.  Medically stable for discharge, pending psych work-up and placement.  Subjective:  Patient actively hallucinating.  Has no other complaints this morning.  Does not appear to be harm to self  Objective: Temp:  [98 F (36.7 C)-98.6 F (37 C)] 98.1 F (36.7 C) (10/11 0432) Pulse Rate:  [76-88] 83 (10/11 0432) Resp:  [18] 18 (10/11 0432) BP: (122-152)/(69-96) 152/96 (10/11 0432) SpO2:  [82 %-100 %] 100 % (10/11 0432) Physical Exam: General: NAD, pleasant, sitting on side of bed Respiratory: normal work of breathing MSK: moves 4 extremities equally Derm: no rashes appreciated Neuro: CN II-XII grossly intact Psych: Visual hallucinations, nervous/anxious, alert and oriented. Speech is normal at times and pressured at others  Laboratory: Recent Labs  Lab 02/20/19 1044 02/20/19 2246  WBC 10.6* 9.3  HGB 10.4* 10.7*  HCT 35.7* 34.4*  PLT 280 269   Recent Labs  Lab 02/20/19 1044  02/22/19 1806 02/22/19 2314 02/23/19 0549  NA 137   < > 142 141 141  K 6.1*   < > 5.1 5.0 4.6  CL 107   < > 117* 116* 116*  CO2 21*   < > 18* 19* 17*  BUN 25*   < > 16 14  12  CREATININE 1.94*   < > 1.45* 1.43* 1.41*  CALCIUM 9.1   < > 9.0 8.8* 8.7*  PROT 7.2  --   --   --   --   BILITOT 0.4  --   --   --   --   ALKPHOS 60  --   --   --   --   ALT 16  --   --   --   --   AST 19  --   --   --   --   GLUCOSE 173*   < > 249* 331* 364*   < > = values in this interval not displayed.    Theodus Ran, [place], DO 02/23/2019, 8:21 AM PGY-3, [facility] Intern pager: [phone], text pages welcome

## 2019-02-24 DIAGNOSIS — R443 Hallucinations, unspecified: Secondary | ICD-10-CM | POA: Diagnosis not present

## 2019-02-24 DIAGNOSIS — E119 Type 2 diabetes mellitus without complications: Secondary | ICD-10-CM

## 2019-02-24 DIAGNOSIS — F250 Schizoaffective disorder, bipolar type: Secondary | ICD-10-CM | POA: Diagnosis not present

## 2019-02-24 DIAGNOSIS — F200 Paranoid schizophrenia: Secondary | ICD-10-CM | POA: Diagnosis not present

## 2019-02-24 LAB — BASIC METABOLIC PANEL
Anion gap: 8 (ref 5–15)
BUN: 10 mg/dL (ref 8–23)
CO2: 21 mmol/L — ABNORMAL LOW (ref 22–32)
Calcium: 9.3 mg/dL (ref 8.9–10.3)
Chloride: 113 mmol/L — ABNORMAL HIGH (ref 98–111)
Creatinine, Ser: 1.29 mg/dL — ABNORMAL HIGH (ref 0.61–1.24)
GFR calc Af Amer: >60 mL/min (ref >60)
GFR calc non Af Amer: 59 mL/min — ABNORMAL LOW (ref >60)
Glucose, Bld: 155 mg/dL — ABNORMAL HIGH (ref 70–99)
Potassium: 4.4 mmol/L (ref 3.5–5.1)
Sodium: 142 mmol/L (ref 135–145)

## 2019-02-24 LAB — GLUCOSE, CAPILLARY
Glucose-Capillary: 146 mg/dL — ABNORMAL HIGH (ref 70–99)
Glucose-Capillary: 150 mg/dL — ABNORMAL HIGH (ref 70–99)
Glucose-Capillary: 175 mg/dL — ABNORMAL HIGH (ref 70–99)
Glucose-Capillary: 336 mg/dL — ABNORMAL HIGH (ref 70–99)

## 2019-02-24 NOTE — Clinical Social Work Note (Signed)
Continuing search for psych facility. Call made to [facility] and patient declined to chronicity with mental health issues, as they are an acute psychiatric unit. Call made to [facility] and they recommended a geri-psych facility. [name] will continue psychiatric facility search.  Winnie Umali Givens, MSW, LCSW Licensed Clinical Social Worker [place] [phone]

## 2019-02-24 NOTE — Plan of Care (Signed)
°  Problem: Coping: °Goal: Level of anxiety will decrease °Outcome: Progressing °  °

## 2019-02-24 NOTE — Progress Notes (Signed)
  Unable to convince patient to get blood drawn. Patient continues to hallucinate but not combative. Will not give his arm for testing.

## 2019-02-24 NOTE — Progress Notes (Signed)
Finally agreed to get blood drawn.

## 2019-02-24 NOTE — Progress Notes (Signed)
Family Medicine Teaching Service Daily Progress Note Intern Pager: [phone]  Patient name: Kimball Appleby Medical record number: [id number] Date of birth: Sep 22, 1955 Age: 63 y.o. Gender: male  Primary Care Provider: Shirley, [place], DO Consultants: Psychiatry Code Status: Full Code   Pt Overview and Major Events to Date:  10/8-admitted  Assessment and Plan: Sender Rueb a 63 y.o.malepresenting with hallucinations and elevated potassium.PMH is significant for schizophrenia, type 2 diabetes, CKD, hypertension, hyperlipidemia, Hx alcohol polysubstance abuse.  AMS 2/2 psychiatric medications  schizophrenia Patient recently discharged from behavioral health hospital after staying there for approximately 1.5 months. Patient currently IVC'd for placement. Patient denies that he is hallucinating although his speech is tangential, patient responds appropriately to redirection and cooperates with physical exam. Currently on Cogentin, Depakote, Haldol, lithium. Last lithium level of 0.58. -Psychiatry following, appreciate recommendations -awaiting placement for inpatient psych  -Continue 1:1 sitter -Ensure adequate p.o.  Hyperkalemia-resolved Most recent potassium 4.4. K Was 6.1 when patient was admitted.  Etiology unknown, s/p Lokelma 30mg . No EKG changes noted during hospitalization. -Monitor on BMP daily  Anxiety Home medications include hydroxyzine -Hydroxyzine decreased to 25 mg every 6 hours as needed  Type 2 diabetes with peripheral neuropathy Home medications include Lantus45 units. On gabapentin for neuropathy in his feet. BG overnight ranged from 146-171 -Sensitive sliding scale -sSI -will consider adding 5-10 units of basal insulin coverage for tomorrow -Continue gabapentin 600 mg 3 times daily  CKD stage III Current creatinine 1.29.  Baseline appears to be ~1.3-1.5.  -Trend BMP / urinary output -Replace electrolytes as indicated -Avoid nephrotoxic agents,  ensure adequate renal perfusion  Hypertension Currently normotensive 129/76.Home medications include amlodipine 10 mg -Continue amlodipine  Hyperlipidemia Medications include atorvastatin 20 mg -Continue atorvastatin  History of alcohol/polysubstance abuse, stable Patient with recent 1.9-month hospital stay at behavioral health. Not concerned for any withdrawal at this point. -Recommendcessation to patient during discharge  GERD, stable All medications include famotidine 20 mg, Protonix 40 mg. -Continue home Protonix.  Gout, stable Patient on allopurinol at home medication -Continue home medications  Constipation-improved -Continue MiraLAX  BPH, stable With mixed incontinence.Homemedications include finasteride, tamsulosin -Continue home medications  FEN/GI: Heart healthy/carb modified PPx: Lovenox  Disposition: IVC in place.  Medically stable for discharge, pending psych work-up and placement.  Subjective:  Patient has no complaints overnight   Objective: Temp:  [98 F (36.7 C)-99 F (37.2 C)] 98.9 F (37.2 C) (10/12 0852) Pulse Rate:  [71-88] 80 (10/12 0852) Resp:  [17-20] 20 (10/12 0852) BP: (129-158)/(76-92) 129/76 (10/12 0852) SpO2:  [98 %-100 %] 100 % (10/12 3762)  Physical Exam: General: male appearing stated age in NAD lying in bed  Cardiovascular: RRR without murmur or gallops  Respiratory: CTAB without wheezing or increased WOB  Abdomen: soft and NT with bowel sounds present  Extremities: no edema present in lower extremties Psych: tangential speech, easily redirected, calm and cooperative with exam, answers questions appropriately   Laboratory: Recent Labs  Lab 02/20/19 1044 02/20/19 2246  WBC 10.6* 9.3  HGB 10.4* 10.7*  HCT 35.7* 34.4*  PLT 280 269   Recent Labs  Lab 02/20/19 1044  02/22/19 2314 02/23/19 0549 02/24/19 0641  NA 137   < > 141 141 142  K 6.1*   < > 5.0 4.6 4.4  CL 107   < > 116* 116* 113*  CO2 21*   < >  19* 17* 21*  BUN 25*   < > 14 12 10   CREATININE 1.94*   < >  1.43* 1.41* 1.29*  CALCIUM 9.1   < > 8.8* 8.7* 9.3  PROT 7.2  --   --   --   --   BILITOT 0.4  --   --   --   --   ALKPHOS 60  --   --   --   --   ALT 16  --   --   --   --   AST 19  --   --   --   --   GLUCOSE 173*   < > 331* 364* 155*   < > = values in this interval not displayed.    Imaging/Diagnostic Tests: No new imaging   Nicki Guadalajara, MD 02/24/2019, 12:14 PM PGY-1, [facility] Family Medicine FPTS Intern pager: [phone], text pages welcome

## 2019-02-25 LAB — GLUCOSE, CAPILLARY
Glucose-Capillary: 275 mg/dL — ABNORMAL HIGH (ref 70–99)
Glucose-Capillary: 151 mg/dL — ABNORMAL HIGH (ref 70–99)
Glucose-Capillary: 152 mg/dL — ABNORMAL HIGH (ref 70–99)
Glucose-Capillary: 149 mg/dL — ABNORMAL HIGH (ref 70–99)

## 2019-02-25 LAB — BASIC METABOLIC PANEL
Anion gap: 10 (ref 5–15)
BUN: 13 mg/dL (ref 8–23)
CO2: 20 mmol/L — ABNORMAL LOW (ref 22–32)
Calcium: 8.8 mg/dL — ABNORMAL LOW (ref 8.9–10.3)
Chloride: 108 mmol/L (ref 98–111)
Creatinine, Ser: 1.42 mg/dL — ABNORMAL HIGH (ref 0.61–1.24)
GFR calc Af Amer: >60 mL/min (ref >60)
GFR calc non Af Amer: 53 mL/min — ABNORMAL LOW (ref >60)
Glucose, Bld: 298 mg/dL — ABNORMAL HIGH (ref 70–99)
Potassium: 4.4 mmol/L (ref 3.5–5.1)
Sodium: 138 mmol/L (ref 135–145)

## 2019-02-25 MED ORDER — INSULIN GLARGINE 100 UNIT/ML ~~LOC~~ SOLN
20.0000 [IU] | Freq: Every day | SUBCUTANEOUS | Status: DC
  Administered 2019-02-25 – 2019-02-27 (×3): 20 [IU] via SUBCUTANEOUS
  Filled 2019-02-25 (×3): qty 0.2

## 2019-02-25 MED ORDER — ENOXAPARIN SODIUM 60 MG/0.6ML ~~LOC~~ SOLN
55.0000 mg | SUBCUTANEOUS | Status: DC
  Administered 2019-02-27 – 2019-03-10 (×9): 55 mg via SUBCUTANEOUS
  Filled 2019-02-25 (×10): qty 0.6

## 2019-02-25 NOTE — Progress Notes (Signed)
Family Medicine Teaching Service Daily Progress Note Intern Pager: [phone]  Patient name: Dylan Jordan Medical record number: [id number] Date of birth: 04/12/1956 Age: 63 y.o. Gender: male  Primary Care Provider: Shirley, [place], DO Consultants: Psychiatry  Code Status: Full code    Pt Overview and Major Events to Date:  10/8-admitted  Assessment and Plan: Dylan Jordan a 63 y.o.malepresenting with hallucinations and elevated potassium.PMH is significant for schizophrenia, type 2 diabetes, CKD, hypertension, hyperlipidemia, Hx alcohol polysubstance abuse.  AMS 2/2 psychiatricmedicationsschizophrenia Patient currentlyIVC'dfor placement.Patient denies that he is hallucinating although his speech is tangential, patient responds appropriately to redirection and cooperates with physical exam. Currently on Cogentin, Depakote, Haldol,lithium. Last lithium level of0.58.  Placement has become a challenge for this patient as the facility he has been determined to have chronic issues that do not meet criteria for the acute facility. Patient was recommended to be placed at geriatric psychiatric facility. Will re-consult psychiatry for clarification on patient's haldol medication as he received IM dosing early in the month and will need to clarify if he should continue to receive injections or PO haldol.  -Psychiatry following, appreciate recommendations -awaiting placement for inpatient psych -SW assisting with placement, patient denied at [facility] due to chronicity of psychiatry diagnoses, SW working to place patient in geriatric psychiatric facility  -Continue1:1 sitter -Ensure adequate p.o. -re-consult psychiatry   Hyperkalemia-resolved  potassium stable at4.4. K Was 6.1 when patient was admitted. Etiology unknown,s/p Lokelma30mg .No EKG changesnotedduring hospitalization. -Monitor on BMPdaily  Anxiety Home medications include hydroxyzine -Hydroxyzine decreased  to25 mg every 6 hours as needed  Type 2 diabeteswith peripheral neuropathy Home medications include Lantus45 units. On gabapentin for neuropathy in his feet. BG overnight ranged from 175-336. -Sensitive sliding scale -start 20 units of basal insulin daily -Continue gabapentin 600 mg 3 times daily  CKDstage III Current creatinine 1.29. Baseline appears to be ~1.3-1.5.  -Trend BMP / urinary output -Replace electrolytes as indicated -Avoid nephrotoxic agents, ensure adequate renal perfusion  Hypertension Patient normotensive for the majority of the night, most recently 135/81.Home medications include amlodipine 10 mg -Continue amlodipine  Hyperlipidemia Medications include atorvastatin 20 mg -Continue atorvastatin  History of alcohol/polysubstance abuse, stable Patientwith recent1.60-month hospital stay at behavioral health. Not concerned for any withdrawal at this point. -Recommendcessation to patient during discharge  GERD, stable All medications include famotidine 20 mg, Protonix 40 mg. -Continue home Protonix.  Gout, stable Patient on allopurinol at home medication -Continue home medications  Constipation-improved -Continue MiraLAX  BPH, stable With mixed incontinence.Homemedications include finasteride, tamsulosin -Continue home medications  FEN/GI:Heart healthy/carb modified PPx: Lovenox  Disposition:IVC in place.Medically stable for discharge, awaiting placement.  Subjective:  No complaints or issues this morning. Patient states he feels well this morning.   Objective: Temp:  [98.3 F (36.8 C)-98.7 F (37.1 C)] 98.3 F (36.8 C) (10/13 0924) Pulse Rate:  [72-88] 88 (10/13 0924) Resp:  [18-20] 18 (10/13 0924) BP: (115-136)/(73-89) 115/73 (10/13 0924) SpO2:  [98 %-100 %] 98 % (10/13 0924)  Physical Exam: General: male appearing stated age in NAD  Cardiovascular: RRR without murmurs or gallops  Respiratory: CTAB without  wheezing or crackles  Abdomen: soft, NT, bowel sounds present  Extremities: no LE edema   Laboratory: Recent Labs  Lab 02/20/19 1044 02/20/19 2246  WBC 10.6* 9.3  HGB 10.4* 10.7*  HCT 35.7* 34.4*  PLT 280 269   Recent Labs  Lab 02/20/19 1044  02/23/19 0549 02/24/19 0641 02/25/19 0521  NA 137   < > 141  142 138  K 6.1*   < > 4.6 4.4 4.4  CL 107   < > 116* 113* 108  CO2 21*   < > 17* 21* 20*  BUN 25*   < > 12 10 13   CREATININE 1.94*   < > 1.41* 1.29* 1.42*  CALCIUM 9.1   < > 8.7* 9.3 8.8*  PROT 7.2  --   --   --   --   BILITOT 0.4  --   --   --   --   ALKPHOS 60  --   --   --   --   ALT 16  --   --   --   --   AST 19  --   --   --   --   GLUCOSE 173*   < > 364* 155* 298*   < > = values in this interval not displayed.    Imaging/Diagnostic Tests: No new imaging   Stark Klein, MD 02/25/2019, 11:48 AM PGY-1, [facility] Intern pager: [phone], text pages welcome

## 2019-02-25 NOTE — Clinical Social Work Note (Signed)
Calls made to psychiatric facilities today: 1. [facility] - At this time their unit is too acute to take new patient. [name] can call back tomorrow. [place] [phone]). Talked with [facility]. Unable to accept patient due to his insurance - Medicaid. They can't bill Medicaid and he would be private pay. [place] [phone]). Left message regarding patient and need for inpatient psychiatric placement.  [name] will continue search for psychiatric facilities for Mr. Preslar. Jessica Seidman Givens, MSW, LCSW Licensed Clinical Social Worker [place] [phone]

## 2019-02-25 NOTE — Progress Notes (Signed)
Inpatient Diabetes Program Recommendations  AACE/ADA: New Consensus Statement on Inpatient Glycemic Control (2015)  Target Ranges:  Prepandial:   less than 140 mg/dL      Peak postprandial:   less than 180 mg/dL (1-2 hours)      Critically ill patients:  140 - 180 mg/dL   Lab Results  Component Value Date   GLUCAP 275 (H) 02/25/2019   HGBA1C 8.9 (H) 02/20/2019    Review of Glycemic Control Results for Dylan Jordan, Dylan Jordan (MRN [id number]) as of 02/25/2019 11:10  Ref. Range 02/24/2019 06:55 02/24/2019 11:53 02/24/2019 16:17 02/24/2019 20:36 02/25/2019 06:56  Glucose-Capillary Latest Ref Range: 70 - 99 mg/dL 146 (H) Novolog 1 unit 150 (H) Novolog 1 unit 175 (H) Novolog 2 units 336 (H) 275 (H)   Diabetes history: DM2 Outpatient Diabetes medications: Lantus 45 units Daily, Humalog 10 units tid  Current orders for Inpatient glycemic control: Novolog sensitive correction tid  Inpatient Diabetes Program Recommendations:   -Lantus 25 units qd  -Add hs Novolog correction 0-5 units  Thank you, Bethena Roys E. Hagan Maltz, RN, MSN, CDE  Diabetes Coordinator Inpatient Glycemic Control Team Team Pager [phone] (8am-5pm) 02/25/2019 11:13 AM

## 2019-02-26 DIAGNOSIS — F250 Schizoaffective disorder, bipolar type: Secondary | ICD-10-CM | POA: Diagnosis not present

## 2019-02-26 DIAGNOSIS — R443 Hallucinations, unspecified: Secondary | ICD-10-CM | POA: Diagnosis not present

## 2019-02-26 LAB — GLUCOSE, CAPILLARY
Glucose-Capillary: 156 mg/dL — ABNORMAL HIGH (ref 70–99)
Glucose-Capillary: 227 mg/dL — ABNORMAL HIGH (ref 70–99)
Glucose-Capillary: 284 mg/dL — ABNORMAL HIGH (ref 70–99)
Glucose-Capillary: 289 mg/dL — ABNORMAL HIGH (ref 70–99)
Glucose-Capillary: 93 mg/dL (ref 70–99)

## 2019-02-26 MED ORDER — LANTUS SOLOSTAR 100 UNIT/ML ~~LOC~~ SOPN: 25.0000 [IU] | PEN_INJECTOR | Freq: Every day | SUBCUTANEOUS | 12 refills | Status: DC

## 2019-02-26 MED ORDER — HYDROXYZINE HCL 25 MG PO TABS: 25.0000 mg | ORAL_TABLET | Freq: Four times a day (QID) | ORAL | 0 refills | Status: DC | PRN

## 2019-02-26 MED ORDER — TAMSULOSIN HCL 0.4 MG PO CAPS: 0.4000 mg | ORAL_CAPSULE | Freq: Every day | ORAL | 0 refills | Status: DC

## 2019-02-26 MED ORDER — HALOPERIDOL 0.5 MG PO TABS: 0.5000 mg | ORAL_TABLET | Freq: Two times a day (BID) | ORAL | 0 refills | Status: DC

## 2019-02-26 MED ORDER — BENZTROPINE MESYLATE 1 MG PO TABS: 1.0000 mg | ORAL_TABLET | Freq: Two times a day (BID) | ORAL | 0 refills | Status: DC

## 2019-02-26 MED ORDER — GABAPENTIN 300 MG PO CAPS: 600.0000 mg | ORAL_CAPSULE | Freq: Three times a day (TID) | ORAL | 0 refills | Status: DC

## 2019-02-26 MED ORDER — INSULIN ASPART 100 UNIT/ML ~~LOC~~ SOLN
2.0000 [IU] | Freq: Once | SUBCUTANEOUS | Status: AC
  Administered 2019-02-27: 2 [IU] via SUBCUTANEOUS

## 2019-02-26 NOTE — Plan of Care (Signed)
  Problem: Clinical Measurements: Goal: Ability to maintain clinical measurements within normal limits will improve Outcome: Progressing   

## 2019-02-26 NOTE — Evaluation (Addendum)
Physical Therapy Evaluation Patient Details Name: Dylan Jordan MRN: [id number] DOB: 1955-07-30 Today's Date: 02/26/2019    History of Present Illness  Pt is a 63 y/o male admitted secondary to hallucinations and AMS likely secondary to medications. Pt also with hyperkalemia. PMH includes schizophrenia and DM.   Clinical Impression  Pt admitted secondary to problem above with deficits below. Pt requiring min to min guard A for mobility using RW this session. Pt with flat affect and mumbled speech, however, likely close to baseline. Per notes, plan is to d/c to inpatient psych facility. Will continue to follow acutely to maximize functional mobility independence and safety.     Follow Up Recommendations Other (comment)(inpatient psych) SNF if pt does not qualify for inpatient psych    Equipment Recommendations  Rolling walker with 5" wheels    Recommendations for Other Services       Precautions / Restrictions Precautions Precautions: Fall Restrictions Weight Bearing Restrictions: No      Mobility  Bed Mobility               General bed mobility comments: Sitting EOB with sitter at baseline.   Transfers Overall transfer level: Needs assistance Equipment used: Rolling walker (2 wheeled) Transfers: Sit to/from Stand Sit to Stand: Min assist         General transfer comment: Min A for steadying assist. Cues for safe hand placement.   Ambulation/Gait Ambulation/Gait assistance: Min guard Gait Distance (Feet): 200 Feet Assistive device: Rolling walker (2 wheeled) Gait Pattern/deviations: Step-through pattern;Decreased stride length;Shuffle Gait velocity: Decreased   General Gait Details: Slow, shuffle type gait. Cues for increased step height, however, pt continued to shuffle. Pt with flexed posture as well.   Stairs            Wheelchair Mobility    Modified Rankin (Stroke Patients Only)       Balance Overall balance assessment: Needs  assistance Sitting-balance support: No upper extremity supported;Feet supported Sitting balance-Leahy Scale: Good     Standing balance support: Bilateral upper extremity supported;During functional activity Standing balance-Leahy Scale: Poor Standing balance comment: Reliant on BUE support                              Pertinent Vitals/Pain Pain Assessment: No/denies pain    Home Living Family/patient expects to be discharged to:: Other (Comment)(Inpatient psych placement. )                      Prior Function Level of Independence: Independent with assistive device(s)         Comments: Reports he sometimes uses a cane and sometimes uses a RW.      Hand Dominance        Extremity/Trunk Assessment   Upper Extremity Assessment Upper Extremity Assessment: Defer to OT evaluation    Lower Extremity Assessment Lower Extremity Assessment: Generalized weakness    Cervical / Trunk Assessment Cervical / Trunk Assessment: Kyphotic  Communication   Communication: Expressive difficulties(slurred speech and somewhat mumbled.)  Cognition Arousal/Alertness: Awake/alert Behavior During Therapy: Flat affect Overall Cognitive Status: No family/caregiver present to determine baseline cognitive functioning                                 General Comments: Pt with flat affect and mumbled speech. Hard to understand at times. Pt with psych history at baseline.  General Comments      Exercises     Assessment/Plan    PT Assessment Patient needs continued PT services  PT Problem List Decreased strength;Decreased balance;Decreased mobility;Decreased cognition;Decreased safety awareness;Decreased knowledge of use of DME       PT Treatment Interventions Gait training;Stair training;DME instruction;Functional mobility training;Therapeutic activities;Therapeutic exercise;Balance training;Patient/family education    PT Goals (Current goals can  be found in the Care Plan section)  Acute Rehab PT Goals Patient Stated Goal: none stated PT Goal Formulation: With patient Time For Goal Achievement: 03/12/19 Potential to Achieve Goals: Good    Frequency Min 3X/week   Barriers to discharge        Co-evaluation               AM-PAC PT "6 Clicks" Mobility  Outcome Measure Help needed turning from your back to your side while in a flat bed without using bedrails?: A Little Help needed moving from lying on your back to sitting on the side of a flat bed without using bedrails?: A Little Help needed moving to and from a bed to a chair (including a wheelchair)?: A Little Help needed standing up from a chair using your arms (e.g., wheelchair or bedside chair)?: A Little Help needed to walk in hospital room?: A Little Help needed climbing 3-5 steps with a railing? : A Lot 6 Click Score: 17    End of Session Equipment Utilized During Treatment: Gait belt Activity Tolerance: Patient tolerated treatment well Patient left: in bed;with call bell/phone within reach;with nursing/sitter in room(Sitting EOB ) Nurse Communication: Mobility status PT Visit Diagnosis: Unsteadiness on feet (R26.81);Muscle weakness (generalized) (M62.81)    Time: 6415-8309 PT Time Calculation (min) (ACUTE ONLY): 20 min   Charges:   PT Evaluation $PT Eval Moderate Complexity: 1 Mod          Gladys Damme, PT, DPT  Acute Rehabilitation Services  Pager: [phone] Office: [phone]   Lehman Prom 02/26/2019, 3:01 PM

## 2019-02-26 NOTE — Progress Notes (Signed)
Family Medicine Teaching Service Daily Progress Note Intern Pager: [phone]  Patient name: Dylan Jordan Medical record number: [id number] Date of birth: 21-Mar-1956 Age: 63 y.o. Gender: male  Primary Care Provider: Shirley, [place], DO Consultants: Psychiatry Code Status: Full code  Pt Overview and Major Events to Date: 10/8-admitted  Assessment and Plan: [name] a 63 y.o.malepresenting with hallucinations and elevated potassium.PMH is significant for schizophrenia, type 2 diabetes, CKD, hypertension, hyperlipidemia, Hx alcohol polysubstance abuse.  AMS 2/2 psychiatricmedicationsschizophrenia Patient currentlyIVC'dfor placement. Patient continues to be calm and cooperative with exam and interview questions. This morning he endorses active hallucinations while I was in the room. Currently on Cogentin, Depakote, Haldol,lithium.Last lithium level of0.58.  Spoke with psychiatry for clarification on patient's haldol medication as he received IM dosing early in the month and will need to clarify if he should continue to receive injections or PO haldol, psychiatry recommends that patient follow up with [facility] as outpatient and receive next injection in Nov. -Psychiatry reconsulted, patient determined to be stable from psychiatric standpoint and safe for discharge home as he is no eminent danger to himself or others -Continue1:1 sitter -Ensure adequate p.o.  Hyperkalemia-resolved  potassium stable at4.4.KWas 6.1 when patient was admitted.Etiology unknown,s/p Lokelma30mg .No EKG changesnotedduring hospitalization. -Monitor on BMPdaily  Anxiety Home medications include hydroxyzine -Hydroxyzine decreased to25 mg every 6 hours as needed  Type 2 diabeteswith peripheral neuropathy Home medications include Lantus45 units. On gabapentin for neuropathy in his feet.BG overnight ranged from 175-336. -Sensitive sliding scale -start 20 units of basal insulin  daily -Continue gabapentin 600 mg 3 times daily  CKDstage III Last creatinine 1.29. Baseline appears to be ~1.3-1.5.  -Trend BMP / urinary output -Replace electrolytes as indicated -Avoid nephrotoxic agents, ensure adequate renal perfusion  Hypertension Patient normotensive for the majority of the night, most recently 129/74.Home medications include amlodipine 10 mg -Continue amlodipine  Hyperlipidemia Medications include atorvastatin 20 mg -Continue atorvastatin  History of alcohol/polysubstance abuse, stable Patientwith recent1.81-month hospital stay at behavioral health. Not concerned for any withdrawal at this point. -Recommendcessation to patient during discharge  GERD, stable All medications include famotidine 20 mg, Protonix 40 mg. -Continue home Protonix.  Gout, stable Patient on allopurinol at home medication -Continue home medications  Constipation-improved -Continue MiraLAX  BPH, stable With mixed incontinence.Homemedications include finasteride, tamsulosin -Continue home medications  FEN/GI:Heart healthy/carb modified PPx: Lovenox  Disposition:IVC in place.Medically stable for discharge home, pending recommendations from PT/OT  Subjective:  Patient reports some concern for his teeth, stating that he has several cavities. I reassured patient that we could note this in his discharge summary and recommend follow up with dental. Did not appreciate any overt signs of infection during my physical exam.   Objective: Temp:  [98.1 F (36.7 C)-98.7 F (37.1 C)] 98.1 F (36.7 C) (10/14 0954) Pulse Rate:  [63-76] 69 (10/14 0954) Resp:  [18] 18 (10/14 0954) BP: (113-150)/(73-89) 150/87 (10/14 0954) SpO2:  [98 %-100 %] 100 % (10/14 0954)  Physical Exam: General: well appearing male sitting up having breakfast in NAD  Cardiovascular: RRR without murmurs or gallops  Respiratory: CTAB, normal WOB, stable on RA without crackles or wheezing   Abdomen: NT, +bowel sounds throughout, soft  Extremities: no LE edema   Laboratory: Recent Labs  Lab 02/20/19 1044 02/20/19 2246  WBC 10.6* 9.3  HGB 10.4* 10.7*  HCT 35.7* 34.4*  PLT 280 269   Recent Labs  Lab 02/20/19 1044  02/23/19 0549 02/24/19 0641 02/25/19 0521  NA 137   < >  141 142 138  K 6.1*   < > 4.6 4.4 4.4  CL 107   < > 116* 113* 108  CO2 21*   < > 17* 21* 20*  BUN 25*   < > 12 10 13   CREATININE 1.94*   < > 1.41* 1.29* 1.42*  CALCIUM 9.1   < > 8.7* 9.3 8.8*  PROT 7.2  --   --   --   --   BILITOT 0.4  --   --   --   --   ALKPHOS 60  --   --   --   --   ALT 16  --   --   --   --   AST 19  --   --   --   --   GLUCOSE 173*   < > 364* 155* 298*   < > = values in this interval not displayed.    Imaging/Diagnostic Tests: No new imaging  , MD 02/26/2019, 4:05 PM PGY-1, [facility] Family Medicine FPTS Intern pager: [phone], text pages welcome

## 2019-02-26 NOTE — Clinical Social Work Note (Signed)
[name] reviewed 10/14 psychiatry consult note. Per note - Disposition: No evidence of imminent risk to self or others at present. In the note it also states that patient is psychiatrically stable at this time. It does not appear, per note that patient may need inpatient psychiatric plaecment or to be IVC'd. Call made to Ms. Lord, Designer, jewellery and message left to get clarity.   Cristiano Capri Givens, MSW, LCSW Licensed Clinical Social Worker [place] [phone]

## 2019-02-26 NOTE — Consult Note (Signed)
[facility] Face-to-Face Psychiatry Consult   Reason for Consult:  Hallucinations  Referring Physician:  Dr Sharol Harness Patient Identification: Dylan Jordan MRN:  [id number] Principal Diagnosis: hyperkalemia  Diagnosis:  Schizoaffective d/o, bipolar type  Total Time spent with patient: 30 minutes  Subjective:   Dylan Jordan is a 63 y.o. male patient admitted with AMS and hallucinations, hyperkalemia.  "I am doing well."  Patient seen and evaluated in person by this provider.  At the beginning of this assessment, patient reported doing well.  Then he changed and said that he was not doing so well and when this was explored he reports he did seeing some visual hallucinations.  He, however, does not appear to be responding to these hallucinations.  Discussed chronic hallucinations and how medications can assist but do not always resolve the hallucinations.  Explained this is where therapy assist with the remaining hallucinations.  Denies suicidal/homicidal ideations, substance abuse.  He is concerned about his place of stay and states he was told he could go back if he would clean it up.  He is aware of needing Haldol dec, next dose is due on 11/2.  He can receive this medications at [facility] since he reports living in [place].  Recommend follow-up with social work for housing issues, psychiatrically stable at this time.  02/21/19: Patient seen and evaluated in person by this provider.  Long history of paranoid schizophrenia, discharged from [facility] on 02/18/2019.  Came to the ED on 10/8 with medical issues.  On assessment he has some slurring of speech with some difficulty comprehending what he was communicating.  Per notes at [facility], he was transferred from [facility] for medical concerns.  Medication regiment adjusted there and Haldol Deconnate 400 mg given on 10/2.  Prior to this admission, he was on a couple of antipsychotics.  Patient continues to report hallucinations at times, visual of but like  animals on the floor.  Denies auditory hallucinations.  No distress noted.    HPI per MD:  Briefly, 63 yo M with history of schizophrenia, diabetes, hyperlipidemia, gout presenting with AMS and hallucinations in setting of recent discharge from a prolonged psychiatric hospitalization one day prior to presentation.  MRI and CT do not demonstrate acute intracranial pathology. Suspect this is largely psychiatrically mediated and the hyperkalemia is an incidental finding (which needs to be treated, nonetheless).  Longstanding mild anemia noted - would get iron studies at some point to assess (whether inpatient or outpatient).  He did complain of vague groin discomfort to me though history is extremely limited by his psychiatric disease and garbled speech. He reports his R testicle has been larger than the L since the springtime. Could not really say whether he has dysuria (answer changed from moment to moment). He does urinate frequently and takes flomax and finasteride. Exam notable for fairly marked tenderness of the posterior aspect of the  R testicle, without warmth, swelling, or erythema. (Exam performed with sitter Janine Limbo present in the room.). UA was unremarkable on admission. Will check gc/chlamydia in urine and obtain scrotal ultrasound.  Past Psychiatric History: schizophrenia  Risk to Self: Suicidal Ideation: No Suicidal Intent: No Is patient at risk for suicide?: No Suicidal Plan?: No Access to Means: No What has been your use of drugs/alcohol within the last 12 months?: NA How many times?: 0 Other Self Harm Risks: NA Triggers for Past Attempts: None known Intentional Self Injurious Behavior: None Risk to Others: Homicidal Ideation: No Thoughts of Harm to Others: No Current  Homicidal Intent: No Current Homicidal Plan: No Access to Homicidal Means: No Identified Victim: NA History of harm to others?: No Assessment of Violence: None Noted Violent Behavior Description: NA Does  patient have access to weapons?: No Criminal Charges Pending?: No Does patient have a court date: No Prior Inpatient Therapy: Prior Inpatient Therapy: Yes Prior Therapy Dates: 2020 Prior Therapy Facilty/Provider(s): [place] Reason for Treatment: Schiziphrenia Prior Outpatient Therapy: Prior Outpatient Therapy: Yes Prior Therapy Dates: Current. Prior Therapy Facilty/Provider(s): [name].  Reason for Treatment: Medication management and counseling.  Does patient have an ACCT team?: No Does patient have Intensive In-House Services?  : No Does patient have [name] services? : Yes Does patient have P4CC services?: No  Past Medical History:  Past Medical History:  Diagnosis Date  . Anemia   . Asthma   . Diabetes mellitus without complication ([place])   . Gout   . Hyperlipidemia   . Hyperosmolar hyperglycemic coma due to diabetes mellitus without ketoacidosis ([place])   . Hypertension   . Schizophrenia ([place]) 07/06/2018    Past Surgical History:  Procedure Laterality Date  . REPLACEMENT TOTAL KNEE Left   . TOE SURGERY     Family History:  Family History  Adopted: Yes  Problem Relation Age of Onset  . Allergic rhinitis Neg Hx   . Angioedema Neg Hx   . Asthma Neg Hx   . Atopy Neg Hx   . Eczema Neg Hx   . Immunodeficiency Neg Hx   . Urticaria Neg Hx    Family Psychiatric  History: none Social History:  Social History   Substance and Sexual Activity  Alcohol Use No     Social History   Substance and Sexual Activity  Drug Use No    Social History   Socioeconomic History  . Marital status: Single    Spouse name: Not on file  . Number of children: Not on file  . Years of education: Not on file  . Highest education level: Not on file  Occupational History  . Not on file  Social Needs  . Financial resource strain: Somewhat hard  . Food insecurity    Worry: Sometimes true    Inability: Sometimes true  . Transportation needs    Medical: No    Non-medical: No   Tobacco Use  . Smoking status: Current Some Day Smoker    Packs/day: 0.30    Years: 45.00    Pack years: 13.50    Types: Cigarettes    Start date: 05/16/1971  . Smokeless tobacco: Never Used  . Tobacco comment: Decreased Intake to 10 cigarettes per week or less.  Substance and Sexual Activity  . Alcohol use: No  . Drug use: No  . Sexual activity: Not on file  Lifestyle  . Physical activity    Days per week: Not on file    Minutes per session: Not on file  . Stress: Not on file  Relationships  . Social Herbalist on phone: Not on file    Gets together: Not on file    Attends religious service: Not on file    Active member of club or organization: Not on file    Attends meetings of clubs or organizations: Not on file    Relationship status: Not on file  Other Topics Concern  . Not on file  Social History Narrative   Lives by himself at home. Closest family member is niece.       ETOH none  Drugs non e   Current smoker, none for three days    Additional Social History:    Allergies:   Allergies  Allergen Reactions  . Chantix [Varenicline Tartrate] Other (See Comments)    Nightmares  . Ativan [Lorazepam] Other (See Comments)    Agitation (and "5 others like it") [facility] need to [facility] (phone) [phone] and [phone] to fax a release-   . Shellfish-Derived Products Itching  . Penicillins Diarrhea    Has patient had a PCN reaction causing immediate rash, facial/tongue/throat swelling, SOB or lightheadedness with hypotension: No Has patient had a PCN reaction causing severe rash involving mucus membranes or skin necrosis: No Has patient had a PCN reaction that required hospitalization: No Has patient had a PCN reaction occurring within the last 10 years: No If all of the above answers are "NO", then may proceed with Cephalosporin use.     Labs:  Results for orders placed or performed during the hospital encounter of  02/20/19 (from the past 48 hour(s))  Glucose, capillary     Status: Abnormal   Collection Time: 02/24/19 11:53 AM  Result Value Ref Range   Glucose-Capillary 150 (H) 70 - 99 mg/dL  Glucose, capillary     Status: Abnormal   Collection Time: 02/24/19  4:17 PM  Result Value Ref Range   Glucose-Capillary 175 (H) 70 - 99 mg/dL  Glucose, capillary     Status: Abnormal   Collection Time: 02/24/19  8:36 PM  Result Value Ref Range   Glucose-Capillary 336 (H) 70 - 99 mg/dL  Basic metabolic panel     Status: Abnormal   Collection Time: 02/25/19  5:21 AM  Result Value Ref Range   Sodium 138 135 - 145 mmol/L   Potassium 4.4 3.5 - 5.1 mmol/L   Chloride 108 98 - 111 mmol/L   CO2 20 (L) 22 - 32 mmol/L   Glucose, Bld 298 (H) 70 - 99 mg/dL   BUN 13 8 - 23 mg/dL   Creatinine, Ser 2.951.42 (H) 0.61 - 1.24 mg/dL   Calcium 8.8 (L) 8.9 - 10.3 mg/dL   GFR calc non Af Amer 53 (L) >60 mL/min   GFR calc Af Amer >60 >60 mL/min   Anion gap 10 5 - 15    Comment: Performed at [facility], [address] [place], [place] [address]  Glucose, capillary     Status: Abnormal   Collection Time: 02/25/19  6:56 AM  Result Value Ref Range   Glucose-Capillary 275 (H) 70 - 99 mg/dL   Comment 1 Notify RN   Glucose, capillary     Status: Abnormal   Collection Time: 02/25/19 11:44 AM  Result Value Ref Range   Glucose-Capillary 151 (H) 70 - 99 mg/dL  Glucose, capillary     Status: Abnormal   Collection Time: 02/25/19  4:56 PM  Result Value Ref Range   Glucose-Capillary 152 (H) 70 - 99 mg/dL  Glucose, capillary     Status: Abnormal   Collection Time: 02/25/19  9:49 PM  Result Value Ref Range   Glucose-Capillary 149 (H) 70 - 99 mg/dL  Glucose, capillary     Status: Abnormal   Collection Time: 02/26/19  7:01 AM  Result Value Ref Range   Glucose-Capillary 284 (H) 70 - 99 mg/dL    Current Facility-Administered Medications  Medication Dose Route Frequency Provider Last Rate Last Dose  . acetaminophen (TYLENOL)  tablet 650 mg  650 mg Oral Q6H  PRN Ellwood Dense, DO       Or  . acetaminophen (TYLENOL) suppository 650 mg  650 mg Rectal Q6H PRN Ellwood Dense, DO      . albuterol (PROVENTIL) (2.5 MG/3ML) 0.083% nebulizer solution 3 mL  3 mL Inhalation Q6H PRN Ellwood Dense, DO      . allopurinol (ZYLOPRIM) tablet 200 mg  200 mg Oral Daily Ellwood Dense, DO   200 mg at 02/26/19 0846  . amLODipine (NORVASC) tablet 10 mg  10 mg Oral Daily Ellwood Dense, DO   10 mg at 02/26/19 0847  . benztropine (COGENTIN) tablet 1 mg  1 mg Oral BID Charm Rings, NP   1 mg at 02/26/19 0948  . divalproex (DEPAKOTE) DR tablet 2,000 mg  2,000 mg Oral QPM Ellwood Dense, DO   2,000 mg at 02/25/19 1740  . divalproex (DEPAKOTE) DR tablet 500 mg  500 mg Oral q morning - 10a Rumball, Alison, DO   500 mg at 02/26/19 0847  . enoxaparin (LOVENOX) injection 55 mg  55 mg Subcutaneous Q24H Steenwyk, Yujing Z, RPH      . famotidine (PEPCID) tablet 20 mg  20 mg Oral BID Ellwood Dense, DO   20 mg at 02/26/19 0846  . finasteride (PROSCAR) tablet 5 mg  5 mg Oral Daily Ellwood Dense, DO   5 mg at 02/26/19 0846  . gabapentin (NEURONTIN) capsule 600 mg  600 mg Oral TID Ellwood Dense, DO   600 mg at 02/26/19 0846  . haloperidol (HALDOL) tablet 0.5 mg  0.5 mg Oral BID Charm Rings, NP   0.5 mg at 02/26/19 0948  . hydrOXYzine (ATARAX/VISTARIL) tablet 25 mg  25 mg Oral Q6H PRN Charm Rings, NP   25 mg at 02/25/19 2131  . insulin aspart (novoLOG) injection 0-9 Units  0-9 Units Subcutaneous TID AC Doreene Eland, MD   5 Units at 02/26/19 0730  . insulin glargine (LANTUS) injection 20 Units  20 Units Subcutaneous Daily Ellwood Dense, DO   20 Units at 02/26/19 0948  . lithium carbonate capsule 300 mg  300 mg Oral BID WC Rumball, Alison, DO   300 mg at 02/26/19 0730  . pantoprazole (PROTONIX) EC tablet 40 mg  40 mg Oral Daily Ellwood Dense, DO   40 mg at 02/26/19 0846  . polyethylene glycol (MIRALAX / GLYCOLAX) packet 17 g  17  g Oral Daily PRN Ellwood Dense, DO      . tamsulosin (FLOMAX) capsule 0.4 mg  0.4 mg Oral QPC breakfast Ellwood Dense, DO   0.4 mg at 02/26/19 0730    Musculoskeletal: Strength & Muscle Tone: within normal limits Gait & Station: normal Patient leans: N/A  Psychiatric Specialty Exam: Physical Exam  Nursing note and vitals reviewed. Constitutional: He is oriented to person, place, and time. He appears well-developed and well-nourished.  HENT:  Head: Normocephalic.  Neck: Normal range of motion.  Respiratory: Effort normal.  Musculoskeletal: Normal range of motion.  Neurological: He is alert and oriented to person, place, and time.  Psychiatric: His speech is normal. Judgment and thought content normal. His affect is blunt. He is actively hallucinating. Cognition and memory are normal.    Review of Systems  Psychiatric/Behavioral: Positive for hallucinations.  All other systems reviewed and are negative.   Blood pressure 129/74, pulse 76, temperature 98.6 F (37 C), temperature source Oral, resp. rate 18, height 6\' 1"  (1.854 m), weight 111 kg, SpO2 99 %.Body mass index is 32.29 kg/m.  General Appearance: Casual  Eye Contact:  Good  Speech:  Slurred  Volume:  Normal  Mood:  Anxious  Affect:  Blunt  Thought Process:  Coherent and Descriptions of Associations: Intact  Orientation:  Full (Time, Place, and Person)  Thought Content:  Hallucinations: Visual  Suicidal Thoughts:  No  Homicidal Thoughts:  No  Memory:  Immediate;   Fair Recent;   Fair Remote;   Fair  Judgement:  Fair  Insight:  Fair  Psychomotor Activity:  Normal  Concentration:  Concentration: Fair and Attention Span: Fair  Recall:  [employer] of Knowledge:  Fair  Language:  Fair  Akathisia:  No  Handed:  Right  AIMS (if indicated):     Assets:  Housing Leisure Time Physical Health Resilience  ADL's:  Intact  Cognition:  WNL  Sleep:       Treatment Plan Summary: Schizoaffective disorder, bipolar  type: -Continue Depakote 500 mg in the and 2000 mg in the pm -Ordered Valproic acid level, 87, stable -Continue Lithium 300 mg BID -Ordered Lithium level, slightly below the normal range, will let outpatient manage this -Continue Haldol 0.5 mg BID -Follow up with [name] for next haldol dec injection on 11/2 400 mg, follow up information in discharge instructions  EPS: -Continue Cogentin 1 mg BID  Anxiety: -Continue gabapentin 600 mg TID -Continue hydroxyzine 25 mg every six hours PRN  Disposition: No evidence of imminent risk to self or others at present.    Nanine Means, NP 02/26/2019 9:53 AM

## 2019-02-27 LAB — GLUCOSE, CAPILLARY
Glucose-Capillary: 120 mg/dL — ABNORMAL HIGH (ref 70–99)
Glucose-Capillary: 240 mg/dL — ABNORMAL HIGH (ref 70–99)
Glucose-Capillary: 263 mg/dL — ABNORMAL HIGH (ref 70–99)
Glucose-Capillary: 224 mg/dL — ABNORMAL HIGH (ref 70–99)

## 2019-02-27 LAB — BASIC METABOLIC PANEL
Anion gap: 8 (ref 5–15)
BUN: 18 mg/dL (ref 8–23)
CO2: 23 mmol/L (ref 22–32)
Calcium: 9.1 mg/dL (ref 8.9–10.3)
Chloride: 107 mmol/L (ref 98–111)
Creatinine, Ser: 1.39 mg/dL — ABNORMAL HIGH (ref 0.61–1.24)
GFR calc Af Amer: >60 mL/min (ref >60)
GFR calc non Af Amer: 54 mL/min — ABNORMAL LOW (ref >60)
Glucose, Bld: 138 mg/dL — ABNORMAL HIGH (ref 70–99)
Potassium: 4.3 mmol/L (ref 3.5–5.1)
Sodium: 138 mmol/L (ref 135–145)

## 2019-02-27 MED ORDER — INSULIN ASPART 100 UNIT/ML ~~LOC~~ SOLN
0.0000 [IU] | Freq: Every day | SUBCUTANEOUS | Status: DC
  Administered 2019-02-27 – 2019-02-28 (×2): 2 [IU] via SUBCUTANEOUS
  Administered 2019-03-01: 3 [IU] via SUBCUTANEOUS
  Administered 2019-03-04: 2 [IU] via SUBCUTANEOUS

## 2019-02-27 MED ORDER — INSULIN GLARGINE 100 UNIT/ML ~~LOC~~ SOLN
22.0000 [IU] | Freq: Every day | SUBCUTANEOUS | Status: DC
  Administered 2019-02-28 – 2019-03-09 (×10): 22 [IU] via SUBCUTANEOUS
  Filled 2019-02-27 (×12): qty 0.22

## 2019-02-27 NOTE — TOC Initial Note (Signed)
Transition of Care [facility]) - Initial/Assessment Note    Patient Details  Name: Dylan Jordan MRN: [id number] Date of Birth: 1956/05/01  [facility] CM/SW Contact:    Sable Feil, LCSW Phone Number: 02/27/2019, 6:01 PM  Clinical Narrative:   Patient was IVC'd during hospitalization (02/22/19) as it was determined that he was at risk to harm himself or others and psychiatric placement was recommended by psychiatrist.  On 10/9 Dylan Jordan was seen again by behavioral health nurse practitioner and it was determined that there was "No evidence of imminent risk to self or others at present" and that patient was psychiatrically stable at this time. On today, [name] talked with Dr. Rosita Fire regarding patient and she talked with NP and patient does not need inpatient psychiatric placement. [name] was advised to cancel IVC paperwork and the notice of commitment change faxed to [employer] today.  Reviewed therapy notes and PT recommending SNF. Talked with patient at the bedside regarding his discharge and informed him that he no longer needed psychiatric placement. Dylan Jordan informed [name] that he uses [facility] and needs another doctor, and expressed concerns regarding his current doctor not getting his medication dosages correct. [name] explained that PT recommended SNF for ST rehab and that if he was agreeable to SNF for rehab he would have to stay at least 30 days and turn over his check to the facility, less a small amount that would be put in his patient account. This was discussed and Dylan Jordan declined SNF. Patient advised that he was medically stable for discharge and would probably discharge home on Friday. During the discussion of his discharge patient talked about walking the halls of the unit with the walker a couple of times a day since being in the hospital, and that he has a walker and cane at home. He also reported that his niece works but could provide him with some assistance.  Dylan Jordan also talked about needing to stay until Monday and did not understand why he had to discharge all of a sudden. Patient talked about having to get his apartment ready and that he could not just go home and discussion ensued regarding his concerns. Dylan Jordan informed that his doctor will be advised regarding his decision to discharge home.                    Expected Discharge Plan: [facility](HH will not pay for therapies. Patient may be able to get other [facility] services if paid for by Medicaid) Barriers to Discharge: No SNF bed(Talked with patient regarding SNF versus home)   Patient Goals and CMS Choice - Patient has chosen to discharge home.   CMS Medicare.gov Compare Post Acute Care list provided to:: Other (Comment Required)(List not provided as patient declined SNF) Choice offered to / list presented to : NA(Talked with patient about SNF - list not given as patient declined SNF)  Expected Discharge Plan and Services Expected Discharge Plan: [facility](HH will not pay for therapies. Patient may be able to get other [facility] services if paid for by [facility]) In-house Referral: Clinical Social Work     Living arrangements for the past 2 months: Apartment                                     Prior Living Arrangements/Services Living arrangements for the past  2 months: Apartment Lives with:: Self Patient language and need for interpreter reviewed:: No Do you feel safe going back to the place where you live?: Yes   Dylan Jordan wants to discharge home and per patient his rent is paid, but he does not want to discharge until Monday  Need for Family Participation in Patient Care: Yes (Comment) Care giver support system in place?: No (comment)   Criminal Activity/Legal Involvement Pertinent to Current Situation/Hospitalization: No - Comment as needed  Activities of Daily Living   ADL Screening (condition at time of admission) Patient's cognitive  ability adequate to safely complete daily activities?: Yes Does the patient have difficulty seeing, even when wearing glasses/contacts?: No Does the patient have difficulty concentrating, remembering, or making decisions?: No Patient able to express need for assistance with ADLs?: Yes Does the patient have difficulty dressing or bathing?: No Independently performs ADLs?: Yes (appropriate for developmental age)  Permission Sought/Granted Permission sought to share information with : Other (comment)(Patient did not give permission for family to be contacted) Permission granted to share information with : No              Emotional Assessment Appearance:: Appears stated age Attitude/Demeanor/Rapport: Other (comment)(Dylan Jordan became irritated with [name] as conversation continued, as patient wants to remain in hospital until Monday and reported that his rent is paid but his apartment is not available (in his contract) for him to discharge at this time) Affect (typically observed): Frustrated(Dylan Jordan became frustrated with [name] as the conversation continued as he could ot understand why he has to discharge maybe Friday, and why he can't stay until Monday) Orientation: : Oriented to Self, Oriented to Place, Oriented to Situation Alcohol / Substance Use: Tobacco Use, Alcohol Use, Illicit Drugs(Per H&P, patient smokes and does not drink or use illicit drugs) Psych Involvement: Yes (comment)  Admission diagnosis:  Hallucinations [R44.3] Hyperkalemia [E87.5] Patient Active Problem List   Diagnosis Date Noted  . Schizoaffective disorder, bipolar type (HCC) 02/21/2019  . Hallucinations   . Anxiety 09/18/2018  . Swelling of both lower extremities 09/13/2018  . Type 2 diabetes mellitus without complication, with long-term current use of insulin (HCC)   . Anemia   . Hyperlipidemia 12/18/2017  . 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 Intermittent chest pain 11/25/2017  .  Allergic rhinitis with a nonallergic component 07/30/2017  . Food allergy 07/30/2017  . Chronic gout involving toe of left foot without tophus 05/18/2017  . Hemorrhoids 05/04/2017  . Haloperidol adverse reaction 04/16/2017  . Chronic low back pain 01/03/2017  . Chronic cough 06/30/2016  . Drug-induced mood disorder (HCC) 06/14/2016  . Type 2 diabetes mellitus with diabetic neuropathy (HCC) 05/26/2016  . Iron deficiency anemia 05/26/2016  . Chronic kidney disease 05/26/2016  . HTN (hypertension) 05/26/2016  . Tobacco abuse 05/26/2016  . History of substance abuse (HCC) 05/26/2016  . History of alcohol abuse 05/26/2016  . Mild persistent asthma 05/26/2016   PCP:  Shirley, [place], DO Pharmacy:   [facility] [place], [place] - [address] [place] [address] Phone: [phone] Fax: [phone]  [facility] [place], [place] - [address] [place] [address] Phone: [phone] Fax: [phone]     Social Determinants of Health (SDOH) Interventions    Readmission Risk Interventions No flowsheet data found.

## 2019-02-27 NOTE — Evaluation (Addendum)
Occupational Therapy Evaluation Patient Details Name: Dylan Jordan MRN: [id number] DOB: 03-13-1956 Today's Date: 02/27/2019    History of Present Illness Pt is a 63 y/o male admitted secondary to hallucinations and AMS likely secondary to medications. Pt also with hyperkalemia. PMH includes schizophrenia and DM.    Clinical Impression   Pt PTA: Pt living alone and independent prior with mobility and ADL. Pt currently with slurred and sometimes mumbled speech; however, A/Ox3. Pt performing ADL tasks at sink and at commode with modified independence; RW for mobility in room all functional tasks with modified independence and no physical assist required. Pt was not having any hallucinations at this time. Pt appears to be having a social at this time as he is "unsure of living arrangements for after I leave here." Pt required repeated directions verbally, but pt following all multi-step commands. Pt does not require continued OT skilled services. OT signing off.    Follow Up Recommendations  No OT follow up    Equipment Recommendations  None recommended by OT    Recommendations for Other Services       Precautions / Restrictions Precautions Precautions: Fall Restrictions Weight Bearing Restrictions: No      Mobility Bed Mobility Overal bed mobility: Modified Independent             General bed mobility comments: no physical assist required  Transfers Overall transfer level: Needs assistance Equipment used: Rolling walker (2 wheeled) Transfers: Sit to/from Stand Sit to Stand: Modified independent (Device/Increase time)         General transfer comment: no physical assist required    Balance Overall balance assessment: Needs assistance Sitting-balance support: No upper extremity supported Sitting balance-Leahy Scale: Good     Standing balance support: No upper extremity supported;During functional activity Standing balance-Leahy Scale: Fair Standing balance  comment: barely used RW for steadying, but stated "I need that for stability                           ADL either performed or assessed with clinical judgement   ADL Overall ADL's : At baseline                                       General ADL Comments: modified independence for ADL and mobility- no physical assist required     Vision Baseline Vision/History: Wears glasses Wears Glasses: At all times Patient Visual Report: No change from baseline Vision Assessment?: No apparent visual deficits     Perception     Praxis      Pertinent Vitals/Pain Pain Assessment: No/denies pain     Hand Dominance Right   Extremity/Trunk Assessment Upper Extremity Assessment Upper Extremity Assessment: Overall WFL for tasks assessed   Lower Extremity Assessment Lower Extremity Assessment: Defer to PT evaluation   Cervical / Trunk Assessment Cervical / Trunk Assessment: Kyphotic   Communication Communication Communication: Expressive difficulties(slurred and mumbling)   Cognition Arousal/Alertness: Awake/alert Behavior During Therapy: Flat affect Overall Cognitive Status: No family/caregiver present to determine baseline cognitive functioning                                 General Comments: Pt with flat affect and mumbled speech. Hard to understand at times. Pt with psych history at baseline.    General Comments  Exercises     Shoulder Instructions      Home Living Family/patient expects to be discharged to:: Private residence Living Arrangements: Alone Available Help at Discharge: Family Type of Home: Apartment                           Additional Comments: pt unsure of disposition when he leaves here      Prior Functioning/Environment Level of Independence: Independent with assistive device(s)        Comments: Reports he sometimes uses a cane and sometimes uses a RW.         OT Problem List: Decreased  activity tolerance      OT Treatment/Interventions:      OT Goals(Current goals can be found in the care plan section) Acute Rehab OT Goals Patient Stated Goal: none stated OT Goal Formulation: With patient Time For Goal Achievement: 03/13/19 Potential to Achieve Goals: Good  OT Frequency:     Barriers to D/C:            Co-evaluation              AM-PAC OT "6 Clicks" Daily Activity     Outcome Measure Help from another person eating meals?: None Help from another person taking care of personal grooming?: None Help from another person toileting, which includes using toliet, bedpan, or urinal?: None Help from another person bathing (including washing, rinsing, drying)?: A Little Help from another person to put on and taking off regular upper body clothing?: None Help from another person to put on and taking off regular lower body clothing?: None 6 Click Score: 23   End of Session Equipment Utilized During Treatment: Gait belt;Rolling walker Nurse Communication: Mobility status  Activity Tolerance: Patient tolerated treatment well Patient left: in bed;with bed alarm set;with call bell/phone within reach  OT Visit Diagnosis: Unsteadiness on feet (R26.81)                Time: 7408-1448 OT Time Calculation (min): 28 min Charges:  OT General Charges $OT Visit: 1 Visit OT Evaluation $OT Eval Moderate Complexity: 1 Mod OT Treatments $Self Care/Home Management : 8-22 mins  Revonda Standard Cecil Cranker) Glendell Docker OTR/L Acute Rehabilitation Services Pager: [phone] Office: [phone]    Gunnar Fusi Holly Pring 02/27/2019, 12:56 PM

## 2019-02-27 NOTE — Plan of Care (Signed)
  Problem: Elimination: Goal: Will not experience complications related to bowel motility Outcome: Progressing   Problem: Health Behavior/Discharge Planning: Goal: Ability to manage health-related needs will improve Outcome: Not Progressing   Problem: Nutrition: Goal: Adequate nutrition will be maintained Outcome: Not Progressing

## 2019-02-27 NOTE — Plan of Care (Signed)
  Problem: Education: Goal: Knowledge of General Education information will improve Description: Including pain rating scale, medication(s)/side effects and non-pharmacologic comfort measures Outcome: Progressing   Problem: Clinical Measurements: Goal: Respiratory complications will improve Outcome: Completed/Met

## 2019-02-27 NOTE — Plan of Care (Signed)
  Problem: Activity: Goal: Risk for activity intolerance will decrease Outcome: Progressing   Problem: Coping: Goal: Level of anxiety will decrease Outcome: Progressing   Problem: Elimination: Goal: Will not experience complications related to bowel motility Outcome: Progressing   

## 2019-02-27 NOTE — Progress Notes (Signed)
Notified MD from [facility] and asked if suicide sitter could be DC since pt denies any suicidal thoughts or plans. Pt just states he is having visual hallucinations such as a rat turing into a bee. MD gave verbal order to DC the suicide sitter order.   Paulla Fore, RN, BSN.

## 2019-02-27 NOTE — Progress Notes (Signed)
Per notes, pt may not be eligible for inpatient psych placement. Given deficits noted in yesterday's evaluation, feel pt will require SNF level therapies prior to return home as pt at an increased risk for falls and does not have caregiver support at home.   Leighton Ruff, PT, DPT  Acute Rehabilitation Services  Pager: [phone] Office: [phone]

## 2019-02-27 NOTE — Progress Notes (Addendum)
Family Medicine Teaching Service Daily Progress Note Intern Pager: [phone]  Patient name: Dylan Jordan Medical record number: [id number] Date of birth: Jan 05, 1956 Age: 63 y.o. Gender: male  Primary Care Provider: Shirley, [place], DO Consultants: Psychiatry Code Status: Full code  Pt Overview and Major Events to Date: 10/8-admitted  Assessment and Plan: Dylan Jordan a 63 y.o.malepresenting with hallucinations and elevated potassium.PMH is significant for schizophrenia, type 2 diabetes, CKD, hypertension, hyperlipidemia, Hx alcohol polysubstance abuse.  UPDATE 12:41PM  I called to speak with the patient's niece in order to clarify patient's living situation.  Patient's name is Dylan Jordan and asked that she be updated regularly on patient's condition and current medication regimen.  During our conversation Ms. Johnson clarify that the patient does have an apartment and she has been paying the bills for this since he has been in the hospital.  Comes this confirms that patient does have a safe place to live upon discharge from hospital.  Niece was also updated on patient's current medications and all questions were answered in regards to this.  Niece also clarify that she and her mom would assist with patient medications and transportation from the hospital upon discharge as well as getting him to appointments as outpatient.  Niece request to be updated with the date of discharge so that she can travel from [place] in order to pick up the patient from hospital.  Please also requested to know if patient would have appointment scheduled at [facility] as follow-up upon discharge.  Also Ms. Laural Benes that we would check with psychiatry to see if this appointment could be made prior to patient being discharged.  Ms. Laural Benes expressed understanding of this.  Ms. Laural Benes also requested to be updated with the final recommendations from physical therapy and Occupational Therapy for the patient's  discharge plan.   AMS 2/2 psychiatricmedicationsschizophrenia Patient currentlyIVC'dfor placement. Patient continues to be calm and cooperative with exam and interview questions. This morning, patient continues to report active hallucinations but does not appear to be distress due to these. Patient reports that he is unsure of a place to go to if he is discharged. Patient also states that it is "too early to go home" as he is still experiencing hallucinations and does not prefer to go to the place he was previously living in.   Psychiatry recommends that patient follow up with [facility] as outpatient and receive next injection in Nov. Psychiatry re-evaluated patient and determined that he is stable for discharge from psychiatry standpoint and no longer meeting criteria for inpatient psychiatric care and patient is not eminent danger to himself or others.  -Continue1:1 sitter -Ensure adequate p.o.  -continue Haldol, cogentin, depakote, and lithium -We will prepare patient for discharge home and to follow-up outpatient with [facility] -Patient to receive next scheduled injection of Haldol in November -PT/OT to evaluate   Anxiety Home medications include hydroxyzine -Hydroxyzine decreased to25 mg every 6 hours as needed  Type 2 diabeteswith peripheral neuropathy Home medications include Lantus45 units. On gabapentin for neuropathy in his feet.BG overnight ranged from 156-289. -Continue sensitive sliding scale -Increase Lantus to 22 from 20 units of basal insulindaily -will add bedtime coverage 1-5 units SSI -Continue gabapentin 600 mg 3 times daily  CKDstage III Last creatinine 1.29. Baseline appears to be ~1.3-1.5.  -Trend BMP / urinary output -Replace electrolytes as indicated -Avoid nephrotoxic agents, ensure adequate renal perfusion  Hypertension Patientnormotensivefor the majority of the night, blood pressure range from 112-150/70-87.Home medications include  amlodipine 10 mg -Continue  amlodipine  Hyperlipidemia Medications include atorvastatin 20 mg -Continue atorvastatin  History of alcohol/polysubstance abuse, stable Patientwith recent1.62-month hospital stay at behavioral health. Not concerned for any withdrawal at this point. -Recommendcessation to patient during discharge  GERD, stable All medications include famotidine 20 mg, Protonix 40 mg. -Continue home Protonix.  Gout, stable Patient on allopurinol at home medication -Continue home medications  Constipation-improved -Continue MiraLAX  BPH, stable With mixed incontinence.Homemedications include finasteride, tamsulosin -Continue home medications  FEN/GI:Heart healthy/carb modified PPx: Lovenox  Disposition: Patient deemed psychiatrically stable and no longer requiring inpatient psychiatric treatment, plan to discharge home with follow-up with [facility], patient medically stable for discharge. Discharge pending recommendations from physical therapy.   Subjective:  Patient reports feeling that it is too early for him to be discharged home as he is still actively hallucinating. Patient also states he is unsure where he would go if discharged instead of transitioned to inpatient psychiatric care.   Objective: Temp:  [98.1 F (36.7 C)-98.3 F (36.8 C)] 98.3 F (36.8 C) (10/15 0336) Pulse Rate:  [68-86] 86 (10/15 0336) Resp:  [14-18] 18 (10/15 0336) BP: (112-150)/(70-90) 129/90 (10/15 0336) SpO2:  [99 %-100 %] 100 % (10/15 0336)  Physical Exam: General: male appearing stated age in NAD  Cardiovascular: RRR no murmurs or gallops Respiratory: CTAB, no wheezing or crackles, no increased WOB, stable on RA   Abdomen: soft, NT, bowel sounds present  Extremities: no LE edema appreciated  Psych: patient initially denies visual hallucinations but begins to have them during morning interviwew  Laboratory: Recent Labs  Lab 02/20/19 1044 02/20/19 2246  WBC 10.6*  9.3  HGB 10.4* 10.7*  HCT 35.7* 34.4*  PLT 280 269   Recent Labs  Lab 02/20/19 1044  02/23/19 0549 02/24/19 0641 02/25/19 0521  NA 137   < > 141 142 138  K 6.1*   < > 4.6 4.4 4.4  CL 107   < > 116* 113* 108  CO2 21*   < > 17* 21* 20*  BUN 25*   < > 12 10 13   CREATININE 1.94*   < > 1.41* 1.29* 1.42*  CALCIUM 9.1   < > 8.7* 9.3 8.8*  PROT 7.2  --   --   --   --   BILITOT 0.4  --   --   --   --   ALKPHOS 60  --   --   --   --   ALT 16  --   --   --   --   AST 19  --   --   --   --   GLUCOSE 173*   < > 364* 155* 298*   < > = values in this interval not displayed.    Imaging/Diagnostic Tests: No new imaging  Stark Klein, MD 02/27/2019, 6:06 AM PGY-1, [place] Intern pager: [phone], text pages welcome

## 2019-02-28 LAB — GLUCOSE, CAPILLARY
Glucose-Capillary: 118 mg/dL — ABNORMAL HIGH (ref 70–99)
Glucose-Capillary: 207 mg/dL — ABNORMAL HIGH (ref 70–99)
Glucose-Capillary: 248 mg/dL — ABNORMAL HIGH (ref 70–99)
Glucose-Capillary: 205 mg/dL — ABNORMAL HIGH (ref 70–99)

## 2019-02-28 NOTE — Progress Notes (Signed)
Received page from nurse that patient has changed his mind and wants to go to SNF at discharge. Spoke with patient who confirmed he wants to go to SNF instead of going home as he does not feel safe to discharge home given current mobility. Also spoke with sister at patient's request who agrees. Will reach out to [name] to inform them of change.  Rory Percy, DO PGY-3, [place] [facility] 02/28/2019 5:41 PM

## 2019-02-28 NOTE — Progress Notes (Signed)
Family Medicine Teaching Service Daily Progress Note Intern Pager: [phone]  Patient name: Dylan Jordan Medical record number: [id number] Date of birth: 06-Jun-1955 Age: 63 y.o. Gender: male  Primary Care Provider: Shirley, [place], DO Consultants: Psychiatry, [name] Code Status: full code   Pt Overview and Major Events to Date: 10/8-admitted  Assessment and Plan: Dylan Jordan a 63 y.o.mal epresenting with hallucinations and elevated potassium.PMH is significant for schizophrenia, type 2 diabetes, CKD, hypertension, hyperlipidemia, Hx alcohol polysubstance abuse.  AMS 2/2 psychiatricmedicationsschizophrenia IVC cancelled. Patient recommended for SNF upon discharge, however, this is not patient's preference. Will need to discuss with niece updates from PT recommendations.   Psychiatry recommends that patient follow up with [facility] as outpatient and receive next injection in Nov. -Ensure adequate p.o.  -continue Haldol, cogentin, depakote, and lithium -We will prepare patient for discharge home and to follow-up outpatient with [facility] -Patient to receive next scheduled injection of Haldol in November -PT recs SNF, patient states that he would consider SNF if his niece could assume responsibility for his finances while in the SNF as he does not want to lose benefits while in rehabilitation  -will discuss medication doses with psychiatry for review  -patient will need follow up appointment with [facility]  Anxiety Home medications include hydroxyzine -Hydroxyzine decreased to25 mg every 6 hours as needed  Type 2 diabeteswith peripheral neuropathy Home medications include Lantus45 units. On gabapentin for neuropathy in his feet.BG overnight ranged from 156-289. -Continue sensitive sliding scale -continue Lantus to 22 units of basal insulindaily - bedtime coverage 1-5 units SSI -Continue gabapentin 600 mg 3 times daily  CKDstage III Lastcreatinine 1.29. Baseline  appears to be ~1.3-1.5.  -Replace electrolytes as indicated -Avoid nephrotoxic agents, ensure adequate renal perfusion  Hypertension Patientnormotensivefor the majority of the night, blood pressure range from .Home medications include amlodipine 10 mg -Continue amlodipine  Hyperlipidemia Medications include atorvastatin 20 mg -Continue atorvastatin  History of alcohol/polysubstance abuse, stable Patientwith recent1.100-month hospital stay at behavioral health. Not concerned for any withdrawal at this point. -Recommendcessation to patient during discharge  GERD, stable All medications include famotidine 20 mg, Protonix 40 mg. -Continue home Protonix.  Gout, stable Patient on allopurinol at home medication -Continue home medications  Constipation-improved -Continue MiraLAX  BPH, stable With mixed incontinence.Homemedications include finasteride, tamsulosin -Continue home medications  FEN/GI:Heart healthy/carb modified PPx: Lovenox Disposition: discharge to be determined with assistance of patient's family, patient does not wish to go to SNF as recommended by PT   Subjective:  Patient has several concerns for reduced doses of psychiatric medications and requests that his medications be reviewed by psychiatrist prior to his discharge. Patient continues to report hallucinations visually, these do not cause distress.   Objective: Temp:  [98.3 F (36.8 C)-98.5 F (36.9 C)] 98.4 F (36.9 C) (10/16 1000) Pulse Rate:  [61-85] 65 (10/16 1000) Resp:  [18-23] 22 (10/16 1000) BP: (109-121)/(62-76) 109/62 (10/16 1000) SpO2:  [98 %-100 %] 98 % (10/16 1000)  Physical Exam: General: male appearing stated age in NAD with glasses on sitting on edge of bed  Cardiovascular: RRR without murmurs  Respiratory: CTAB without wheezing nor crackles or signs of respiratory distress  Abdomen: soft, NT, +bowel sounds  Extremities: no LE edema   Laboratory: No results for  input(s): WBC, HGB, HCT, PLT in the last 168 hours. Recent Labs  Lab 02/24/19 0641 02/25/19 0521 02/27/19 0525  NA 142 138 138  K 4.4 4.4 4.3  CL 113* 108 107  CO2 21* 20* 23  BUN  10 13 18   CREATININE 1.29* 1.42* 1.39*  CALCIUM 9.3 8.8* 9.1  GLUCOSE 155* 298* 138*    Imaging/Diagnostic Tests: No new imaging   , MD 02/28/2019, 11:03 AM PGY-1, [facility] Family Medicine FPTS Intern pager: [phone], text pages welcome

## 2019-02-28 NOTE — Clinical Social Work Note (Signed)
Call received from MD (5:44 pm ) regarding patient's discharge disposition. [name] advised that patient now agreeable to SNF. [name] will advise weekend [name] in hopes that the facility search can be initiated this weekend.  Jamarkis Branam Givens, MSW, LCSW Licensed Clinical Social Worker [place] [phone]

## 2019-03-01 LAB — BASIC METABOLIC PANEL
Anion gap: 7 (ref 5–15)
BUN: 21 mg/dL (ref 8–23)
CO2: 22 mmol/L (ref 22–32)
Calcium: 8.9 mg/dL (ref 8.9–10.3)
Chloride: 106 mmol/L (ref 98–111)
Creatinine, Ser: 1.41 mg/dL — ABNORMAL HIGH (ref 0.61–1.24)
GFR calc Af Amer: >60 mL/min (ref >60)
GFR calc non Af Amer: 53 mL/min — ABNORMAL LOW (ref >60)
Glucose, Bld: 211 mg/dL — ABNORMAL HIGH (ref 70–99)
Potassium: 4.4 mmol/L (ref 3.5–5.1)
Sodium: 135 mmol/L (ref 135–145)

## 2019-03-01 LAB — GLUCOSE, CAPILLARY
Glucose-Capillary: 238 mg/dL — ABNORMAL HIGH (ref 70–99)
Glucose-Capillary: 116 mg/dL — ABNORMAL HIGH (ref 70–99)
Glucose-Capillary: 233 mg/dL — ABNORMAL HIGH (ref 70–99)
Glucose-Capillary: 265 mg/dL — ABNORMAL HIGH (ref 70–99)

## 2019-03-01 NOTE — Progress Notes (Signed)
Physical Therapy Treatment Patient Details Name: Dylan Jordan MRN: [id number] DOB: Feb 15, 1956 Today's Date: 03/01/2019    History of Present Illness Pt is a 63 y/o male admitted secondary to hallucinations and AMS likely secondary to medications. Pt also with hyperkalemia. PMH includes schizophrenia and DM.     PT Comments    Pt was seen for gait training, and worked on safety with all obstacle clearance on the hallway on RW.  Pt is distracted easily but with careful cues can safely navigate.  Follow along for balance and gait, and will work on strengthening as pt will allow, distracted with impending arrival of his sister today.  Follow Up Recommendations  Other (comment)(inpatient psych stay)     Equipment Recommendations  Rolling walker with 5" wheels    Recommendations for Other Services       Precautions / Restrictions Precautions Precautions: Fall Precaution Comments: monitor for direction and safety with walker Restrictions Weight Bearing Restrictions: No    Mobility  Bed Mobility Overal bed mobility: Modified Independent                Transfers Overall transfer level: Needs assistance Equipment used: Rolling walker (2 wheeled) Transfers: Sit to/from Stand Sit to Stand: Supervision         General transfer comment: supervision for safety  Ambulation/Gait Ambulation/Gait assistance: Min guard Gait Distance (Feet): 350 Feet Assistive device: Rolling walker (2 wheeled);1 person hand held assist Gait Pattern/deviations: Step-through pattern;Wide base of support Gait velocity: Decreased Gait velocity interpretation: <1.31 ft/sec, indicative of household ambulator General Gait Details: walks closely to obstacles with continual cues for safety   Stairs             Wheelchair Mobility    Modified Rankin (Stroke Patients Only)       Balance Overall balance assessment: Needs assistance Sitting-balance support: Feet supported Sitting  balance-Leahy Scale: Good       Standing balance-Leahy Scale: Fair Standing balance comment: RW for supervision of standing balance                            Cognition Arousal/Alertness: Awake/alert Behavior During Therapy: Flat affect Overall Cognitive Status: No family/caregiver present to determine baseline cognitive functioning                                 General Comments: speech is unclear, slow response to cues for safety      Exercises      General Comments        Pertinent Vitals/Pain Pain Assessment: No/denies pain    Home Living                      Prior Function            PT Goals (current goals can now be found in the care plan section) Acute Rehab PT Goals Patient Stated Goal: none stated Progress towards PT goals: Progressing toward goals    Frequency    Min 3X/week      PT Plan Current plan remains appropriate    Co-evaluation              AM-PAC PT "6 Clicks" Mobility   Outcome Measure  Help needed turning from your back to your side while in a flat bed without using bedrails?: None Help needed moving from lying on your back to  sitting on the side of a flat bed without using bedrails?: A Little Help needed moving to and from a bed to a chair (including a wheelchair)?: A Little Help needed standing up from a chair using your arms (e.g., wheelchair or bedside chair)?: A Little Help needed to walk in hospital room?: A Little Help needed climbing 3-5 steps with a railing? : A Lot 6 Click Score: 18    End of Session Equipment Utilized During Treatment: Gait belt Activity Tolerance: Patient tolerated treatment well Patient left: in bed;with call bell/phone within reach;with nursing/sitter in room Nurse Communication: Mobility status PT Visit Diagnosis: Unsteadiness on feet (R26.81);Muscle weakness (generalized) (M62.81)     Time: 3154-0086 PT Time Calculation (min) (ACUTE ONLY): 17  min  Charges:  $Gait Training: 8-22 mins                    Ivar Drape 03/01/2019, 9:15 PM   Samul Dada, PT MS Acute Rehab Dept. Number: [facility] [id number] and [facility] [phone]

## 2019-03-01 NOTE — Progress Notes (Addendum)
Family Medicine Teaching Service Daily Progress Note Intern Pager: [phone]  Patient name: Dylan Jordan Medical record number: [id number] Date of birth: 10/22/1955 Age: 63 y.o. Gender: male  Primary Care Provider: Shirley, [place], DO Consultants: Psychiatry, [name] Code Status: full code   Pt Overview and Major Events to Date: 10/8-admitted  Assessment and Plan: Dylan Jordan a 63 y.o.malepresenting with hallucinations and elevated potassium.PMH is significant for schizophrenia, type 2 diabetes, CKD, hypertension, hyperlipidemia, Hx alcohol polysubstance abuse.  AMS 2/2 psychiatricmedicationsschizophrenia Patient states that he does prefer to go to SNF at this time.  Psychiatry recommends that patient follow up with [facility] as outpatient and receive next injection in Nov. -Ensure adequate p.o.  -continue Haldol, cogentin, depakote, and lithium -Follow-up outpatient with [facility] -Patient to receive next scheduled injection of Haldol in November -will discuss medication doses with psychiatry for review   Anxiety Home medications include hydroxyzine -Hydroxyzine decreased to25 mg every 6 hours as needed  Type 2 diabeteswith peripheral neuropathy Home medications include Lantus45 units. On gabapentin for neuropathy in his feet.BG overnight ranged from 205-248. -Continue sensitive sliding scale -continue Lantus to 22 units of basal insulindaily - bedtime coverage 1-5 units SSI -Continue gabapentin 600 mg 3 times daily  CKDstage III Lastcreatinine 1.41. Baseline appears to be ~1.3-1.5.  -Replace electrolytes as indicated -Avoid nephrotoxic agents, ensure adequate renal perfusion  Hypertension Most recent blood pressure 132/75.  Overnight range 132-140/75-92.Home medications include amlodipine 10 mg -Continue amlodipine  Hyperlipidemia Medications include atorvastatin 20 mg -Continue atorvastatin  History of alcohol/polysubstance abuse,  stable Patientwith recent1.71-month hospital stay at behavioral health. Not concerned for any withdrawal at this point. -Recommendcessation to patient during discharge  GERD, stable All medications include famotidine 20 mg, Protonix 40 mg. -Continue home Protonix.  Gout, stable Patient on allopurinol at home medication -Continue home medications  Constipation-improved -Continue MiraLAX  BPH, stable With mixed incontinence.Homemedications include finasteride, tamsulosin -Continue home medications  FEN/GI:Heart healthy/carb modified PPx: Lovenox Disposition: Patient states he prefers to be discharged to SNF  Subjective:  Patient states he continues to have some hallucinations but has no other complaints at this time.  States that he does not wish to go to SNF at this time.  Objective: Temp:  [97.8 F (36.6 C)-98.4 F (36.9 C)] 97.8 F (36.6 C) (10/16 2123) Pulse Rate:  [61-86] 79 (10/16 2123) Resp:  [15-22] 18 (10/16 2123) BP: (109-140)/(62-92) 132/78 (10/16 2123) SpO2:  [98 %-100 %] 98 % (10/16 2123)  Physical Exam: General: Alert and oriented in no apparent distress Heart: Regular rate and rhythm with no murmurs appreciated Lungs: CTA bilaterally, no wheezing Abdomen: Bowel sounds present, no abdominal pain Skin: Warm and dry Extremities: Trace lower leg edema   Laboratory: No results for input(s): WBC, HGB, HCT, PLT in the last 168 hours. Recent Labs  Lab 02/24/19 0641 02/25/19 0521 02/27/19 0525  NA 142 138 138  K 4.4 4.4 4.3  CL 113* 108 107  CO2 21* 20* 23  BUN 10 13 18   CREATININE 1.29* 1.42* 1.39*  CALCIUM 9.3 8.8* 9.1  GLUCOSE 155* 298* 138*    Imaging/Diagnostic Tests: No new imaging   Lurline Del, DO 03/01/2019, 12:05 AM PGY-1, [place] Intern pager: [phone], text pages welcome

## 2019-03-01 NOTE — Plan of Care (Signed)
  Problem: Activity: Goal: Risk for activity intolerance will decrease Outcome: Progressing   

## 2019-03-02 LAB — GLUCOSE, CAPILLARY
Glucose-Capillary: 125 mg/dL — ABNORMAL HIGH (ref 70–99)
Glucose-Capillary: 144 mg/dL — ABNORMAL HIGH (ref 70–99)
Glucose-Capillary: 201 mg/dL — ABNORMAL HIGH (ref 70–99)
Glucose-Capillary: 176 mg/dL — ABNORMAL HIGH (ref 70–99)

## 2019-03-02 MED ORDER — INSULIN ASPART 100 UNIT/ML ~~LOC~~ SOLN
3.0000 [IU] | Freq: Two times a day (BID) | SUBCUTANEOUS | Status: DC
  Administered 2019-03-02 – 2019-03-10 (×16): 3 [IU] via SUBCUTANEOUS

## 2019-03-02 NOTE — TOC Progression Note (Signed)
Transition of Care [facility]) - Progression Note    Patient Details  Name: Dylan Jordan MRN: [id number] Date of Birth: 1955/08/22  Transition of Care [facility]) CM/SW [place], [place] Phone Number: 03/02/2019, 2:10 PM  Clinical Narrative:     [name] called and spoke with Dr. Rosita Fire. [name] explained that since the patient is walking 350 feet with minimal assistance, he would most likely not be SNF appropriate. [name] still completed the workup and faxed out per patient's wishes. As of right now, the patient does not have any bed offers. PASSR under manual review.   1:00PM- [name] met patient at bedside. The patient was asleep. [name] tried multiple times to stir the patient. He would not respond. [name] will attempt to try again later. [name] left CMS SNF List and Home Health list at bedside.   As of now, it looks that the patient would be more appropriate for home health. [name] will attempt to meet with the patient at bedside at a later time and discuss home health options with the patient.   [name] will continue to follow.   Expected Discharge Plan: [place] [facility](HH will not pay for therapies. Patient may be able to get other [facility] services if paid for by Medicaid) Barriers to Discharge: No SNF bed(Talked with patient regarding SNF versus home)  Expected Discharge Plan and Services Expected Discharge Plan: [place] [facility](HH will not pay for therapies. Patient may be able to get other [facility] services if paid for by [facility]) In-house Referral: Clinical Social Work     Living arrangements for the past 2 months: Apartment Expected Discharge Date: 02/28/19                                     Social Determinants of Health (SDOH) Interventions    Readmission Risk Interventions No flowsheet data found.

## 2019-03-02 NOTE — Progress Notes (Addendum)
Family Medicine Teaching Service Daily Progress Note Intern Pager: [phone]  Patient name: Cola Highfill Medical record number: [id number] Date of birth: 04/01/1956 Age: 63 y.o. Gender: male  Primary Care Provider: Shirley, [place], DO Consultants: Psychiatry, [name] Code Status: full code   Pt Overview and Major Events to Date: 10/8-admitted 10/16- patient recommended for SNF by PT  Assessment and Plan: Kwame Ryland a 63 y.o.malepresenting with hallucinations and elevated potassium.PMH is significant for schizophrenia, type 2 diabetes, CKD, hypertension, hyperlipidemia, Hx alcohol polysubstance abuse.  AMS 2/2 psychiatricmedicationsschizophrenia Patient denies any SI/HI or visual hallucinations at the time of my exam today. Patient states that he is doing well and has no pain. .  -continue Haldol, cogentin, depakote, and lithium -Follow-up outpatient with [facility] -Patient to receive next scheduled injection of Haldol in November  [place] medications include hydroxyzine -Hydroxyzine decreased to25 mg every 6 hours as needed  Type 2 diabeteswith peripheral neuropathy Home medications include Lantus45 units. On gabapentin for neuropathy in his feet.BG overnight ranged from 125-265. -Continue sensitive sliding scale - Lantus 22 units of basal insulindaily - bedtime coverage 1-5 units SSI -add 3 units breakfast and dinner -Continue gabapentin 600 mg 3 times daily  CKDstage III Lastcreatinine 1.41. Baseline appears to be ~1.3-1.5.  -Replace electrolytes as indicated -Avoid nephrotoxic agents, ensure adequate renal perfusion  Hypertension Most recent blood pressure 132/75.  Overnight range 106-140/66-80.Home medications include amlodipine 10 mg -Continue amlodipine  Hyperlipidemia Medications include atorvastatin 20 mg -Continue atorvastatin  History of alcohol/polysubstance abuse, stable Patientwith recent1.41-month hospital stay at  behavioral health. Not concerned for any withdrawal at this point. -Recommendcessation to patient during discharge  GERD, stable All medications include famotidine 20 mg, Protonix 40 mg. -Continue home Protonix.  Gout, stable Patient on allopurinol at home medication -Continue home medications  Constipation-improved -Continue MiraLAX  BPH, stable With mixed incontinence.Homemedications include finasteride, tamsulosin -Continue home medications  FEN/GI:Heart healthy/carb modified PPx: Lovenox  Disposition: Patient states he prefers to be discharged to SNF  Subjective:  Patient states he continues to have some hallucinations but has no other complaints at this time.  States that he does not wish to go to SNF at this time.  Objective: Temp:  [97.5 F (36.4 C)-98.3 F (36.8 C)] 98.2 F (36.8 C) (10/18 0955) Pulse Rate:  [62-83] 83 (10/18 0955) Resp:  [18] 18 (10/18 0955) BP: (123-140)/(79-85) 123/85 (10/18 0955) SpO2:  [98 %-100 %] 99 % (10/18 0955)  Physical Exam: General: Alert and cooperative and appears to be in no acute distress HEENT: Neck non-tender without lymphadenopathy, masses or thyromegaly Cardio: Normal S1 and S2, no S3 or S4. Rhythm is regular. No murmurs or rubs.   Pulm: Clear to auscultation bilaterally, no crackles, wheezing, or diminished breath sounds. Normal respiratory effort Abdomen: Bowel sounds normal. Abdomen soft and non-tender.  Extremities: No peripheral edema. Warm/ well perfused.   Neuro: alert, oriented to self and situation   Laboratory: No results for input(s): WBC, HGB, HCT, PLT in the last 168 hours. Recent Labs  Lab 02/25/19 0521 02/27/19 0525 03/01/19 0430  NA 138 138 135  K 4.4 4.3 4.4  CL 108 107 106  CO2 20* 23 22  BUN 13 18 21   CREATININE 1.42* 1.39* 1.41*  CALCIUM 8.8* 9.1 8.9  GLUCOSE 298* 138* 211*    Imaging/Diagnostic Tests: No new imaging   Stark Klein, MD 03/02/2019, 10:47 AM PGY-1, [place] Intern pager: [phone], text pages welcome

## 2019-03-02 NOTE — NC FL2 (Addendum)
[place] LEVEL OF CARE SCREENING TOOL     IDENTIFICATION  Patient Name: Dylan Jordan Birthdate: 01-09-1956 Sex: male Admission Date (Current Location): 02/20/2019  [place] and [place] Number:  Kathleen Argue [id number] [place] and Address:  [facility] [place] [place] [address], [place], [place] [address]      Provider Number: [id number]  Attending Physician Name and Address:  McDiarmid, Blane Ohara, MD  Relative Name and Phone Number:  Claudina Lick, Niece, [phone]    Current Level of Care: Hospital Recommended Level of Care: [place] Prior Approval Number:    Date Approved/Denied:   PASRR Number: Under Manual Review  Discharge Plan: SNF    Current Diagnoses: Patient Active Problem List   Diagnosis Date Noted  . Schizoaffective disorder, bipolar type ([place]) 02/21/2019  . Hallucinations   . Anxiety 09/18/2018  . Swelling of both lower extremities 09/13/2018  . Type 2 diabetes mellitus without complication, with long-term current use of insulin ([place])   . Anemia   . Hyperlipidemia 12/18/2017  . 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 Intermittent chest pain 11/25/2017  . Allergic rhinitis with a nonallergic component 07/30/2017  . Food allergy 07/30/2017  . Chronic gout involving toe of left foot without tophus 05/18/2017  . Hemorrhoids 05/04/2017  . Haloperidol adverse reaction 04/16/2017  . Chronic low back pain 01/03/2017  . Chronic cough 06/30/2016  . Drug-induced mood disorder ([place]) 06/14/2016  . Type 2 diabetes mellitus with diabetic neuropathy ([place]) 05/26/2016  . Iron deficiency anemia 05/26/2016  . Chronic kidney disease 05/26/2016  . HTN (hypertension) 05/26/2016  . Tobacco abuse 05/26/2016  . History of substance abuse ([place]) 05/26/2016  . History of alcohol abuse 05/26/2016  . Mild persistent asthma 05/26/2016    Orientation RESPIRATION BLADDER Height & Weight     Self, Time, Situation,  Place  Normal Continent Weight: 244 lb 11.4 oz (111 kg) Height:  6\' 1"  (185.4 cm)  BEHAVIORAL SYMPTOMS/MOOD NEUROLOGICAL BOWEL NUTRITION STATUS      Continent Diet(heart healthy/ carb mod, thin liquids)  AMBULATORY STATUS COMMUNICATION OF NEEDS Skin   Supervision Verbally Bruising(bruising on back)                       Personal Care Assistance Level of Assistance  Bathing, Feeding, Dressing, Total care Bathing Assistance: Limited assistance Feeding assistance: Independent Dressing Assistance: Limited assistance Total Care Assistance: Limited assistance   Functional Limitations Info  Sight, Hearing, Speech Sight Info: Adequate Hearing Info: Adequate Speech Info: Adequate    SPECIAL CARE FACTORS FREQUENCY  PT (By licensed PT)     PT Frequency: 5x/wk              Contractures Contractures Info: Not present    Additional Factors Info  Code Status, Allergies, Psychotropic, Insulin Sliding Scale Code Status Info: Full Code Allergies Info: Chantix (Varenicline Tartrate), Ativan (Lorazepam), Shellfish-derived Products, Penicillins Psychotropic Info: Haldol .05mg  2x daily; depakote 2,000mg  every evening; depakote 500mg  every morning Insulin Sliding Scale Info: insulin aspart novolog 0-5 units at bedtime; insulin aspart novolog 0-9 units 3x daily w/meals; insulin glargine lantus 22 units daily       Current Medications (03/02/2019):  This is the current hospital active medication list Current Facility-Administered Medications  Medication Dose Route Frequency Provider Last Rate Last Dose  . acetaminophen (TYLENOL) tablet 650 mg  650 mg Oral Q6H PRN Rory Percy, DO       Or  . acetaminophen (  TYLENOL) suppository 650 mg  650 mg Rectal Q6H PRN Ellwood Dense, DO      . albuterol (PROVENTIL) (2.5 MG/3ML) 0.083% nebulizer solution 3 mL  3 mL Inhalation Q6H PRN Ellwood Dense, DO      . allopurinol (ZYLOPRIM) tablet 200 mg  200 mg Oral Daily Rumball, Alison, DO   200 mg  at 03/02/19 0900  . amLODipine (NORVASC) tablet 10 mg  10 mg Oral Daily Ellwood Dense, DO   10 mg at 03/02/19 0900  . benztropine (COGENTIN) tablet 1 mg  1 mg Oral BID Charm Rings, NP   1 mg at 03/02/19 0900  . divalproex (DEPAKOTE) DR tablet 2,000 mg  2,000 mg Oral QPM Ellwood Dense, DO   2,000 mg at 03/01/19 1712  . divalproex (DEPAKOTE) DR tablet 500 mg  500 mg Oral q morning - 10a Rumball, Alison, DO   500 mg at 03/02/19 0900  . enoxaparin (LOVENOX) injection 55 mg  55 mg Subcutaneous Q24H [place], Yujing Z, RPH   55 mg at 03/02/19 0658  . famotidine (PEPCID) tablet 20 mg  20 mg Oral BID Ellwood Dense, DO   20 mg at 03/02/19 0900  . finasteride (PROSCAR) tablet 5 mg  5 mg Oral Daily Rumball, Alison, DO   5 mg at 03/02/19 0900  . gabapentin (NEURONTIN) capsule 600 mg  600 mg Oral TID Ellwood Dense, DO   600 mg at 03/02/19 0900  . haloperidol (HALDOL) tablet 0.5 mg  0.5 mg Oral BID Charm Rings, NP   0.5 mg at 03/02/19 0900  . hydrOXYzine (ATARAX/VISTARIL) tablet 25 mg  25 mg Oral Q6H PRN Charm Rings, NP   25 mg at 03/02/19 0900  . insulin aspart (novoLOG) injection 0-5 Units  0-5 Units Subcutaneous QHS Mirian Mo, MD   3 Units at 03/01/19 2151  . insulin aspart (novoLOG) injection 0-9 Units  0-9 Units Subcutaneous TID AC Doreene Eland, MD   1 Units at 03/02/19 0753  . insulin glargine (LANTUS) injection 22 Units  22 Units Subcutaneous Daily Ellwood Dense, DO   22 Units at 03/02/19 0858  . lithium carbonate capsule 300 mg  300 mg Oral BID WC Ellwood Dense, DO   300 mg at 03/02/19 0858  . pantoprazole (PROTONIX) EC tablet 40 mg  40 mg Oral Daily Rumball, Jill Side, DO   40 mg at 03/02/19 0900  . polyethylene glycol (MIRALAX / GLYCOLAX) packet 17 g  17 g Oral Daily PRN Ellwood Dense, DO      . tamsulosin (FLOMAX) capsule 0.4 mg  0.4 mg Oral QPC breakfast Ellwood Dense, DO   0.4 mg at 03/02/19 1914     Discharge Medications: Please see discharge summary for a list  of discharge medications.  Relevant Imaging Results:  Relevant Lab Results:   Additional Information SSN: [id number]   Will be 30 day medicaid bed  Caileen Veracruz B Ciji [place], LCSWA

## 2019-03-03 LAB — GLUCOSE, CAPILLARY
Glucose-Capillary: 131 mg/dL — ABNORMAL HIGH (ref 70–99)
Glucose-Capillary: 293 mg/dL — ABNORMAL HIGH (ref 70–99)
Glucose-Capillary: 147 mg/dL — ABNORMAL HIGH (ref 70–99)
Glucose-Capillary: 187 mg/dL — ABNORMAL HIGH (ref 70–99)

## 2019-03-03 LAB — BASIC METABOLIC PANEL
Anion gap: 7 (ref 5–15)
BUN: 21 mg/dL (ref 8–23)
CO2: 24 mmol/L (ref 22–32)
Calcium: 9.5 mg/dL (ref 8.9–10.3)
Chloride: 108 mmol/L (ref 98–111)
Creatinine, Ser: 1.56 mg/dL — ABNORMAL HIGH (ref 0.61–1.24)
GFR calc Af Amer: 54 mL/min — ABNORMAL LOW (ref >60)
GFR calc non Af Amer: 47 mL/min — ABNORMAL LOW (ref >60)
Glucose, Bld: 172 mg/dL — ABNORMAL HIGH (ref 70–99)
Potassium: 4.5 mmol/L (ref 3.5–5.1)
Sodium: 139 mmol/L (ref 135–145)

## 2019-03-03 NOTE — Progress Notes (Addendum)
Family Medicine Teaching Service Daily Progress Note Intern Pager: [phone]  Patient name: Dylan Jordan Medical record number: [id number] Date of birth: [birth year] Age: 63 y.o. Gender: male  Primary Care Provider: Shirley, [place], DO Consultants: Psychiatry, [name] Code Status: full code   Pt Overview and Major Events to Date: 10/8-admitted 10/16- patient recommended for SNF by PT  Assessment and Plan: Dylan Jordan a 63 y.o.malepresenting with hallucinations and elevated potassium.PMH is significant for schizophrenia, type 2 diabetes, CKD, hypertension, hyperlipidemia, Hx alcohol polysubstance abuse.  AMS 2/2 psychiatricmedicationsschizophrenia Patient denies any SI/HI or auditory hallucinations at the time of my exam today. Patient does report visual hallucinations active during my exam today. Patient states that he is doing well and has no pain.  -continue Haldol, cogentin, depakote, and lithium -Follow-up outpatient with [facility] [place] -Patient to receive next scheduled injection of Haldol in November -will monitor electrolytes weekly, admitted for hyperkalemia that has since resolved   Anxiety Home medications include hydroxyzine -Hydroxyzine decreased to25 mg every 6 hours as needed  Type 2 diabeteswith peripheral neuropathy Home medications include Lantus45 units. On gabapentin for neuropathy in his feet.BG 131-293 overnight.  -Continue sensitive sliding scale - Lantus 22 units of basal insulindaily - bedtime coverage 1-5 units SSI -continue 3 units breakfast and dinner -Continue gabapentin 600 mg 3 times daily  CKDstage III Lastcreatinine 1.41. Baseline appears to be ~1.3-1.5.  -Replace electrolytes as indicated -Avoid nephrotoxic agents, ensure adequate renal perfusion  Hypertension Most recent blood pressure 132/75.  Overnight range 106-140/66-80.Home medications include amlodipine 10 mg -Continue amlodipine  Hyperlipidemia Medications  include atorvastatin 20 mg -Continue atorvastatin  History of alcohol/polysubstance abuse, stable Patientwith recent1.50-month hospital stay at behavioral health. Not concerned for any withdrawal at this point. -Recommendcessation to patient during discharge  GERD, stable All medications include famotidine 20 mg, Protonix 40 mg. -Continue home Protonix.  Gout, stable Patient on allopurinol at home medication -Continue home medications  Constipation-improved -Continue MiraLAX  BPH, stable With mixed incontinence.Homemedications include finasteride, tamsulosin -Continue home medications  FEN/GI:Heart healthy/carb modified PPx: Lovenox  Disposition: Patient states he prefers to be discharged to SNF, will continue to work with SW for placement options pending patient's choice and SNF acceptance   Subjective:   Patient reports non-disturbing visual hallucinations, denies auditory hallucinations. Denies SI/HI. Patient denies any pain.   Objective: Temp:  [98.1 F (36.7 C)-98.4 F (36.9 C)] 98.1 F (36.7 C) (10/19 0542) Pulse Rate:  [62-87] 62 (10/19 0542) Resp:  [16-20] 18 (10/19 0542) BP: (122-179)/(79-101) 122/81 (10/19 0542) SpO2:  [94 %-99 %] 98 % (10/19 0542)  Physical Exam: General: Alert and cooperative and appears to be in no acute distress Cardio: Normal S1 and S2, no S3 or S4. Rhythm is regular. No murmurs or rubs.   Pulm: Clear to auscultation bilaterally, no crackles, wheezing, or diminished breath sounds. Normal respiratory effort Abdomen: Bowel sounds normal. Abdomen full but would not characterize as distended, non-tender.  Extremities: No peripheral edema.    Neuro: alert, oriented to self and situation, not oriented to year  Laboratory: No results for input(s): WBC, HGB, HCT, PLT in the last 168 hours. Recent Labs  Lab 02/25/19 0521 02/27/19 0525 03/01/19 0430  NA 138 138 135  K 4.4 4.3 4.4  CL 108 107 106  CO2 20* 23 22  BUN 13 18  21   CREATININE 1.42* 1.39* 1.41*  CALCIUM 8.8* 9.1 8.9  GLUCOSE 298* 138* 211*    Imaging/Diagnostic Tests: No new imaging   , MD 03/03/2019,  6:25 AM PGY-1, [place] Intern pager: [phone], text pages welcome

## 2019-03-04 LAB — GLUCOSE, CAPILLARY
Glucose-Capillary: 170 mg/dL — ABNORMAL HIGH (ref 70–99)
Glucose-Capillary: 163 mg/dL — ABNORMAL HIGH (ref 70–99)
Glucose-Capillary: 134 mg/dL — ABNORMAL HIGH (ref 70–99)
Glucose-Capillary: 210 mg/dL — ABNORMAL HIGH (ref 70–99)

## 2019-03-04 MED ORDER — HALOPERIDOL 0.5 MG PO TABS
0.2500 mg | ORAL_TABLET | Freq: Once | ORAL | Status: AC
  Administered 2019-03-04: 0.25 mg via ORAL
  Filled 2019-03-04: qty 0.5

## 2019-03-04 MED ORDER — HALOPERIDOL 0.5 MG PO TABS
0.7500 mg | ORAL_TABLET | Freq: Every morning | ORAL | Status: DC
  Filled 2019-03-04: qty 1.5

## 2019-03-04 MED ORDER — HALOPERIDOL 0.5 MG PO TABS
0.5000 mg | ORAL_TABLET | Freq: Every evening | ORAL | Status: DC
  Administered 2019-03-04: 0.5 mg via ORAL
  Filled 2019-03-04: qty 1

## 2019-03-04 MED ORDER — METFORMIN HCL ER 500 MG PO TB24
500.0000 mg | ORAL_TABLET | Freq: Every day | ORAL | Status: DC
  Administered 2019-03-05 – 2019-03-10 (×6): 500 mg via ORAL
  Filled 2019-03-04 (×6): qty 1

## 2019-03-04 NOTE — Progress Notes (Signed)
Family Medicine Teaching Service Daily Progress Note Intern Pager: [phone]  Patient name: Dylan Jordan Medical record number: [id number] Date of birth: August 30, 1955 Age: 63 y.o. Gender: male  Primary Care Provider: Shirley, [place], DO Consultants: Psychiatry, [name] Code Status: full code   Pt Overview and Major Events to Date: 10/8-admitted 10/16- patient recommended for SNF by PT, patient preferred to stay hospitalized after discharge order placed with plan to go to SNF   Assessment and Plan: Lyndal Reggio a 63 y.o.malepresenting with hallucinations and elevated potassium.PMH is significant for schizophrenia, type 2 diabetes, CKD, hypertension, hyperlipidemia, Hx alcohol polysubstance abuse.  AMS 2/2 psychiatricmedicationsschizophrenia Patient continues to report visual hallucinations and is requesting to have Haldol increased due to these hallucinations.  -continue Haldol, cogentin, depakote, and lithium -Increase Haldol  0.75 in AM nd 0.5mg   -Follow-up outpatient with [facility] -Patient to receive next scheduled injection of Haldol in November -will monitor electrolytes weekly as patient remains inpatient due to placement   Anxiety Home medications include hydroxyzine -Hydroxyzine decreased to25 mg every 6 hours as needed  Type 2 diabeteswith peripheral neuropathy Home medications include Lantus45 units. On gabapentin for neuropathy in his feet.BG 187-293 overnight.  - re-start Metformin 500 mg, patient was discontinued in Feb 20, with recent A1c of 8.9 on admission.  - Continue sensitive sliding scale - Lantus 22 units of basal insulindaily - bedtime coverage 1-5 units SSI -continue 3 units breakfast and dinner -Continue gabapentin 600 mg 3 times daily  CKDstage III Lastcreatinine 1.41. Baseline appears to be ~1.3-1.5.  -Replace electrolytes as indicated -Avoid nephrotoxic agents, ensure adequate renal perfusion  Hypertension Most recent blood  pressure 140/90. Home medications include amlodipine 10 mg -Continue amlodipine  Hyperlipidemia Medications include atorvastatin 20 mg -Continue atorvastatin  History of alcohol/polysubstance abuse, stable Patientwith recent1.36-month hospital stay at behavioral health. Not concerned for any withdrawal at this point. -Recommendcessation to patient during discharge  GERD, stable All medications include famotidine 20 mg, Protonix 40 mg. -Continue home Protonix.  Gout, stable Patient on allopurinol at home medication -Continue home medications  Constipation-improved -Continue MiraLAX  BPH, stable With mixed incontinence.Homemedications include finasteride, tamsulosin -Continue home medications  FEN/GI:Heart healthy/carb modified PPx: Lovenox  Disposition: discharge is pending SNF placement  Subjective:  Patient reports continued visual hallucinations despite current dose of Haldol. Patient is also requesting to go to [facility]. Patient requests 2nd breakfast tray after eating 100% of his first tray. Patient denies SI/HI and auditory hallucination.  Objective: Temp:  [97.9 F (36.6 C)-98.4 F (36.9 C)] 98.4 F (36.9 C) (10/20 1003) Pulse Rate:  [71-83] 83 (10/20 1003) Resp:  [18-19] 19 (10/20 1003) BP: (128-140)/(87-94) 132/87 (10/20 1003) SpO2:  [97 %-100 %] 99 % (10/20 1003)  Physical Exam: General: male appearing stated age in NAD, napping in bed  Cardio: RRR without murmurs, gallops or friction rubs  Pulm: CTAB without wheezing or crackles  Abdomen: full , no tenderness to palpation, bowel sounds present throughout Extremities: no edema or deformities noted  Neuro: alert oriented to self, location month, day of week  Laboratory: No results for input(s): WBC, HGB, HCT, PLT in the last 168 hours. Recent Labs  Lab 02/27/19 0525 03/01/19 0430 03/03/19 0630  NA 138 135 139  K 4.3 4.4 4.5  CL 107 106 108  CO2 23 22 24   BUN 18 21 21    CREATININE 1.39* 1.41* 1.56*  CALCIUM 9.1 8.9 9.5  GLUCOSE 138* 211* 172*    Imaging/Diagnostic Tests: No new imaging   Espy,  Riki Sheer, MD 03/04/2019, 11:52 AM PGY-1, [place] Intern pager: [phone], text pages welcome

## 2019-03-04 NOTE — Plan of Care (Signed)
  Problem: Education: Goal: Knowledge of General Education information will improve Description: Including pain rating scale, medication(s)/side effects and non-pharmacologic comfort measures Outcome: Progressing   Problem: Coping: Goal: Level of anxiety will decrease Outcome: Progressing   

## 2019-03-04 NOTE — Plan of Care (Signed)
  Problem: Education: Goal: Knowledge of General Education information will improve Description: Including pain rating scale, medication(s)/side effects and non-pharmacologic comfort measures Outcome: Not Progressing   Problem: Health Behavior/Discharge Planning: Goal: Ability to manage health-related needs will improve Outcome: Not Progressing   

## 2019-03-04 NOTE — TOC Progression Note (Addendum)
Transition of Care [facility]) - Progression Note    Patient Details  Name: Dylan Jordan MRN: [id number] Date of Birth: 08-23-1955  [facility]) CM/SW Contact  Sharlet Salina Mila Homer, LCSW Phone Number: 03/04/2019, 6:19 PM  Clinical Narrative:  Talked with patient and informed him that the facility search done did not have any SNF's that offered a bed. Patient reported that his sister lives in [place] and he would like for a search to be done there. Consulted with [name] supervisor Denyse Amass regarding situation and she suggested asking sister for (at least) 3 facilities she is interested in. [place] suggested [place], [place]. Call made to Ms. Smitty Pluck [phone]) and she was informed that no facilities in [facility] or surrounding areas accepted her brother. Requested that she provide at least 3 skilled nursing facilities for [place] to contact regarding a Medicaid bed for her brother and sister agreeable to providing this information.  Requested cliicals faxed to [place] [facility] for PASRR number.    Expected Discharge Plan: [facility](HH will not pay for therapies. Patient may be able to get other [facility] services if paid for by Medicaid) Barriers to Discharge: No SNF bed(Talked with patient regarding SNF versus home)  Expected Discharge Plan and Services Expected Discharge Plan: [place] [facility](HH will not pay for therapies. Patient may be able to get other [facility] services if paid for by [facility]) In-house Referral: Clinical Social Work     Living arrangements for the past 2 months: Apartment Expected Discharge Date: 02/28/19                                   Social Determinants of Health (SDOH) Interventions  No SDOH interventions needed at this time  Readmission Risk Interventions No flowsheet data found.

## 2019-03-04 NOTE — Progress Notes (Addendum)
FPTS Interim Progress Note Received page that patient would like to speak with the MD if he was concerned about worsening visual hallucinations.  Called to speak with patient directly via room telephone.  Patient sounds slightly more anxious than during rounds this morning.  Patient states they he is experiencing visual hallucinations of different animals and creatures jumping out of his food and preventing him from being able to eat his meals.  He also reports that his hallucinations are becoming distressing for him and scaring him.  Patient is requesting that his Haldol prescription be increased to 10 mg at night in order to address the hallucinations and help him with sleeping.  Patient reports that he was previously on high-dose Haldol and believes this would solve the problem of hallucinations.  Informed the patient that I would speak with our attending physician and adjust from there.  Patient verbalized understanding.  Ordered 0.25mg  Haldol to bring previous dose to total of 0.75mg  for daytime dose.   Dylan Klein, MD 03/04/2019, 6:05 PM PGY-1, [facility] Service pager [phone]

## 2019-03-04 NOTE — Progress Notes (Signed)
PT Cancellation Note  Patient Details Name: Dylan Jordan MRN: [id number] DOB: 29-Aug-1955   Cancelled Treatment:    Reason Eval/Treat Not Completed: Patient declined, no reason specified Pt just got up from nap and stumbling around room, sitting up to eat lunch. Will follow.   Marguarite Arbour A Jyoti Harju 03/04/2019, 2:48 PM Wray Kearns, PT, DPT Acute Rehabilitation Services Pager [phone] Office [phone]

## 2019-03-05 LAB — GLUCOSE, CAPILLARY
Glucose-Capillary: 212 mg/dL — ABNORMAL HIGH (ref 70–99)
Glucose-Capillary: 166 mg/dL — ABNORMAL HIGH (ref 70–99)
Glucose-Capillary: 128 mg/dL — ABNORMAL HIGH (ref 70–99)
Glucose-Capillary: 188 mg/dL — ABNORMAL HIGH (ref 70–99)

## 2019-03-05 MED ORDER — OLANZAPINE 5 MG PO TABS
5.0000 mg | ORAL_TABLET | Freq: Every day | ORAL | Status: DC
  Administered 2019-03-05 – 2019-03-10 (×5): 5 mg via ORAL
  Filled 2019-03-05 (×6): qty 1

## 2019-03-05 NOTE — Progress Notes (Signed)
Family Medicine Teaching Service Daily Progress Note Intern Pager: [phone]  Patient name: Dylan Jordan Medical record number: [id number] Date of birth: [birth year] Age: 63 y.o. Gender: male  Primary Care Provider: Shirley, [place], DO Consultants: Psychiatry, [name] Code Status: full code   Pt Overview and Major Events to Date: 10/8-admitted 10/16- patient recommended for SNF by PT, patient preferred to stay hospitalized after discharge order placed with plan to go to SNF   Assessment and Plan: Dylan Jordan a 63 y.o.malepresenting with hallucinations and elevated potassium.PMH is significant for schizophrenia, type 2 diabetes, CKD, hypertension, hyperlipidemia, Hx alcohol polysubstance abuse.  AMS 2/2 psychiatricmedicationsschizophrenia Patient reports that his hallucinations were improved from yesterday.Patient emphasizes that hallucinations are visual but scared him yesterday. He has had no hallucinations today. -re-consult psychiatry for medication review as patient concerned about doses and worsening hallucinations, will appreciate recommendations -continue Haldol, cogentin, depakote, and lithium -Increase Haldol  0.75 in AM and 0.5mg  qHS -Follow-up outpatient with [facility] -Patient to receive next scheduled injection of Haldol in November -will monitor electrolytes weekly as patient remains inpatient due to placement   Anxiety Home medications include hydroxyzine -Hydroxyzine decreased to25 mg every 6 hours as needed  Type 2 diabeteswith peripheral neuropathy Home medications include Lantus45 units. On gabapentin for neuropathy in his feet.BG 187-293 overnight.  - re-start Metformin 500 mg, patient was discontinued in Feb 20, with recent A1c of 8.9 on admission.  - Continue sensitive sliding scale - Lantus 22 units of basal insulindaily - bedtime coverage 1-5 units SSI -continue 3 units breakfast and dinner -Continue gabapentin 600 mg 3 times  daily  CKDstage III Lastcreatinine 1.41. Baseline appears to be ~1.3-1.5.  -Replace electrolytes as indicated -Avoid nephrotoxic agents, ensure adequate renal perfusion  Hypertension Most recent blood pressure WNL. Home medications include amlodipine 10 mg -Continue amlodipine  Hyperlipidemia Medications include atorvastatin 20 mg -Continue atorvastatin  History of alcohol/polysubstance abuse, stable Patientwith recent1.23-month hospital stay at behavioral health. Not concerned for any withdrawal at this point. -Recommendcessation to patient during discharge  GERD, stable All medications include famotidine 20 mg, Protonix 40 mg. -Continue home Protonix.  Gout, stable Patient on allopurinol at home medication -Continue home medications  Constipation-improved -Continue MiraLAX  BPH, stable With mixed incontinence.Homemedications include finasteride, tamsulosin -Continue home medications  FEN/GI:Heart healthy/carb modified PPx: Lovenox  Disposition: discharge is pending SNF placement, currently searching for facilities in [place], closer to patient's sister, no acceptances in [place]   Subjective:  Patient reports he has not experienced visual hallucinations today. Patient continues to inquire as to why his Haldol dose has been reduced so much. Informed patient that we would have psychiatry review his medications and dosing.  Objective: Temp:  [98 F (36.7 C)-98.4 F (36.9 C)] 98 F (36.7 C) (10/20 2153) Pulse Rate:  [67-83] 67 (10/20 2153) Resp:  [18-19] 18 (10/20 2153) BP: (108-132)/(71-87) 123/71 (10/20 2153) SpO2:  [97 %-100 %] 100 % (10/20 2153)  Physical Exam: General: male appearing stated age in NAD lying in bed, does not appear to be reacting to internal stimuli   Cardio: RRR without murmur, gallops or friction rubs Pulm:CTAB without wheezing or crackles  Abdomen: full, non tender, bowel sounds present  Extremities: no LE  edema  Neuro: alert, oriented except for year  Laboratory: No results for input(s): WBC, HGB, HCT, PLT in the last 168 hours. Recent Labs  Lab 02/27/19 0525 03/01/19 0430 03/03/19 0630  NA 138 135 139  K 4.3 4.4 4.5  CL 107 106 108  CO2 23 22 24   BUN 18 21 21   CREATININE 1.39* 1.41* 1.56*  CALCIUM 9.1 8.9 9.5  GLUCOSE 138* 211* 172*    Imaging/Diagnostic Tests: No new imaging   Stark Klein, MD 03/05/2019, 6:16 AM PGY-1, [place] Intern pager: [phone], text pages welcome

## 2019-03-05 NOTE — Consult Note (Signed)
[facility]   Reason for Consult:  Hallucinations  Referring Physician:  Dr Sharol HarnessSimmons Patient Identification: Dylan Jordan MRN:  [id number] Principal Diagnosis: hyperkalemia  Diagnosis:  Schizoaffective d/o, bipolar type  Total Time spent with patient: 30 minutes  Subjective:   Dylan Jordan is a 63 y.o. male patient admitted with AMS and hallucinations, hyperkalemia.  "I am having hallucinations and I have a history of psych problems. I am having visual hallucinations where I see like floaters, no not floaters just like speckles. I am having problems with my family and community. "   Patient was seen and evaluated by this Clinical research associatewriter. He describes his mood "okay" however reports having some visual hallucinations. He is appropriate with his responses and does not appear to be responding to internal stimuli. Patient denies having chronic hallucinations, and states they had resolved previously. Despite being a poor historian he is able to discuss his past psych history and his current medications. He reports receiving his last Haldol Dec shot while in the hospital. He also recommends that this writer increase his Haldol to 10mg  at bedtime, because that is what he used to take. He continues to perservate about his needs for housing. He states he had an apartment ([address]) that was let go due to his sisters concerns about being able to take care of himself. He is requesting assistance with housing. When assessing his suicidality, he states "are you asking am I suicidal at this time or have I been suicidal? There is a big difference between suicidal right now because I maybe suicidal later but I am not right now. " He denies si/hi/avh.    02/21/19: Patient seen and evaluated in person by this provider.  Long history of paranoid schizophrenia, discharged from [facility] on 02/18/2019.  Came to the ED on 10/8 with medical issues.  On assessment he has some slurring of speech with some  difficulty comprehending what he was communicating.  [facility].  Medication regiment adjusted there and Haldol Deconnate 400 mg given on 10/2.  Prior to this admission, he was on a couple of antipsychotics.  Patient continues to report hallucinations at times, visual of but like animals on the floor.  Denies auditory hallucinations.  No distress noted.    HPI per MD:  Briefly, 63 yo M with history of schizophrenia, diabetes, hyperlipidemia, gout presenting with AMS and hallucinations in setting of recent discharge from a prolonged psychiatric hospitalization one day prior to presentation.  MRI and CT do not demonstrate acute intracranial pathology. Suspect this is largely psychiatrically mediated and the hyperkalemia is an incidental finding (which needs to be treated, nonetheless).  Longstanding mild anemia noted - would get iron studies at some point to assess (whether inpatient or outpatient).  He did complain of vague groin discomfort to me though history is extremely limited by his psychiatric disease and garbled speech. He reports his R testicle has been larger than the L since the springtime. Could not really say whether he has dysuria (answer changed from moment to moment). He does urinate frequently and takes flomax and finasteride. Exam notable for fairly marked tenderness of the posterior aspect of the  R testicle, without warmth, swelling, or erythema. (Exam performed with sitter Janine LimboKiara present in the room.). UA was unremarkable on admission. Will check gc/chlamydia in urine and obtain scrotal ultrasound.  Past Psychiatric History: schizophrenia  Risk to Self: Suicidal Ideation: No Suicidal Intent:  No Is patient at risk for suicide?: No Suicidal Plan?: No Access to Means: No What has been your use of drugs/alcohol within the last 12 months?: NA How many times?: 0 Other Self Harm Risks: NA Triggers for Past Attempts:  None known Intentional Self Injurious Behavior: None Risk to Others: Homicidal Ideation: No Thoughts of Harm to Others: No Current Homicidal Intent: No Current Homicidal Plan: No Access to Homicidal Means: No Identified Victim: NA History of harm to others?: No Assessment of Violence: None Noted Violent Behavior Description: NA Does patient have access to weapons?: No Criminal Charges Pending?: No Does patient have a court date: No Prior Inpatient Therapy: Prior Inpatient Therapy: Yes Prior Therapy Dates: 2020 Prior Therapy Facilty/Provider(s): [facility] Reason for Treatment: Schiziphrenia Prior Outpatient Therapy: Prior Outpatient Therapy: Yes Prior Therapy Dates: Current. Prior Therapy Facilty/Provider(s): [name].  Reason for Treatment: Medication management and counseling.  Does patient have an ACCT team?: No Does patient have Intensive In-House Services?  : No Does patient have [name] services? : Yes Does patient have P4CC services?: No  Past Medical History:  Past Medical History:  Diagnosis Date  . Anemia   . Asthma   . Diabetes mellitus without complication (HCC)   . Gout   . Hyperlipidemia   . Hyperosmolar hyperglycemic coma due to diabetes mellitus without ketoacidosis (HCC)   . Hypertension   . Schizophrenia (HCC) 07/06/2018    Past Surgical History:  Procedure Laterality Date  . REPLACEMENT TOTAL KNEE Left   . TOE SURGERY     Family History:  Family History  Adopted: Yes  Problem Relation Age of Onset  . Allergic rhinitis Neg Hx   . Angioedema Neg Hx   . Asthma Neg Hx   . Atopy Neg Hx   . Eczema Neg Hx   . Immunodeficiency Neg Hx   . Urticaria Neg Hx    Family Psychiatric  History: none Social History:  Social History   Substance and Sexual Activity  Alcohol Use No     Social History   Substance and Sexual Activity  Drug Use No    Social History   Socioeconomic History  . Marital status: Single    Spouse name: Not on file  .  Number of children: Not on file  . Years of education: Not on file  . Highest education level: Not on file  Occupational History  . Not on file  Social Needs  . Financial resource strain: Somewhat hard  . Food insecurity    Worry: Sometimes true    Inability: Sometimes true  . Transportation needs    Medical: No    Non-medical: No  Tobacco Use  . Smoking status: Current Some Day Smoker    Packs/day: 0.30    Years: 45.00    Pack years: 13.50    Types: Cigarettes    Start date: 05/16/1971  . Smokeless tobacco: Never Used  . Tobacco comment: Decreased Intake to 10 cigarettes per week or less.  Substance and Sexual Activity  . Alcohol use: No  . Drug use: No  . Sexual activity: Not on file  Lifestyle  . Physical activity    Days per week: Not on file    Minutes per session: Not on file  . Stress: Not on file  Relationships  . Social Musician on phone: Not on file    Gets together: Not on file    Attends religious service: Not on file    Active  member of club or organization: Not on file    Attends meetings of clubs or organizations: Not on file    Relationship status: Not on file  Other Topics Concern  . Not on file  Social History Narrative   Lives by himself at home. Closest family member is niece.       ETOH none   Drugs non e   Current smoker, none for three days    Additional Social History:    Allergies:   Allergies  Allergen Reactions  . Chantix [Varenicline Tartrate] Other (See Comments)    Nightmares  . Ativan [Lorazepam] Other (See Comments)    Agitation (and "5 others like it") Will need to call [facility] (phone) [phone] and [phone] to fax a release-   . Shellfish-Derived Products Itching  . Penicillins Diarrhea    Has patient had a PCN reaction causing immediate rash, facial/tongue/throat swelling, SOB or lightheadedness with hypotension: No Has patient had a PCN reaction causing severe rash involving  mucus membranes or skin necrosis: No Has patient had a PCN reaction that required hospitalization: No Has patient had a PCN reaction occurring within the last 10 years: No If all of the above answers are "NO", then may proceed with Cephalosporin use.     Labs:  Results for orders placed or performed during the hospital encounter of 02/20/19 (from the past 48 hour(s))  Glucose, capillary     Status: Abnormal   Collection Time: 03/03/19  4:26 PM  Result Value Ref Range   Glucose-Capillary 147 (H) 70 - 99 mg/dL  Glucose, capillary     Status: Abnormal   Collection Time: 03/03/19  8:36 PM  Result Value Ref Range   Glucose-Capillary 187 (H) 70 - 99 mg/dL  Glucose, capillary     Status: Abnormal   Collection Time: 03/04/19  6:53 AM  Result Value Ref Range   Glucose-Capillary 170 (H) 70 - 99 mg/dL  Glucose, capillary     Status: Abnormal   Collection Time: 03/04/19 11:41 AM  Result Value Ref Range   Glucose-Capillary 163 (H) 70 - 99 mg/dL  Glucose, capillary     Status: Abnormal   Collection Time: 03/04/19  4:31 PM  Result Value Ref Range   Glucose-Capillary 134 (H) 70 - 99 mg/dL  Glucose, capillary     Status: Abnormal   Collection Time: 03/04/19  9:52 PM  Result Value Ref Range   Glucose-Capillary 210 (H) 70 - 99 mg/dL  Glucose, capillary     Status: Abnormal   Collection Time: 03/05/19  7:28 AM  Result Value Ref Range   Glucose-Capillary 212 (H) 70 - 99 mg/dL  Glucose, capillary     Status: Abnormal   Collection Time: 03/05/19 11:21 AM  Result Value Ref Range   Glucose-Capillary 166 (H) 70 - 99 mg/dL    Current Facility-Administered Medications  Medication Dose Route Frequency Provider Last Rate Last Dose  . acetaminophen (TYLENOL) tablet 650 mg  650 mg Oral Q6H PRN Ellwood Dense, DO   650 mg at 03/05/19 1019   Or  . acetaminophen (TYLENOL) suppository 650 mg  650 mg Rectal Q6H PRN Ellwood Dense, DO      . albuterol (PROVENTIL) (2.5 MG/3ML) 0.083% nebulizer solution 3  mL  3 mL Inhalation Q6H PRN Ellwood Dense, DO      . allopurinol (ZYLOPRIM) tablet 200 mg  200 mg Oral Daily Ellwood Dense, DO   200 mg at 03/05/19 0847  .  amLODipine (NORVASC) tablet 10 mg  10 mg Oral Daily Rory Percy, DO   10 mg at 03/05/19 0847  . benztropine (COGENTIN) tablet 1 mg  1 mg Oral BID Patrecia Pour, NP   1 mg at 03/04/19 2156  . divalproex (DEPAKOTE) DR tablet 2,000 mg  2,000 mg Oral QPM Rory Percy, DO   2,000 mg at 03/04/19 1735  . divalproex (DEPAKOTE) DR tablet 500 mg  500 mg Oral q morning - 10a Rumball, Alison, DO   500 mg at 03/05/19 0847  . enoxaparin (LOVENOX) injection 55 mg  55 mg Subcutaneous Q24H Berenice Bouton Z, RPH   55 mg at 03/04/19 3329  . famotidine (PEPCID) tablet 20 mg  20 mg Oral BID Rory Percy, DO   20 mg at 03/05/19 0847  . finasteride (PROSCAR) tablet 5 mg  5 mg Oral Daily Rory Percy, DO   5 mg at 03/05/19 0848  . gabapentin (NEURONTIN) capsule 600 mg  600 mg Oral TID Rory Percy, DO   600 mg at 03/05/19 0848  . haloperidol (HALDOL) tablet 0.5 mg  0.5 mg Oral QPM Matilde Haymaker, MD   0.5 mg at 03/04/19 1735  . haloperidol (HALDOL) tablet 0.75 mg  0.75 mg Oral q morning - 10a Matilde Haymaker, MD      . hydrOXYzine (ATARAX/VISTARIL) tablet 25 mg  25 mg Oral Q6H PRN Patrecia Pour, NP   25 mg at 03/04/19 2156  . insulin aspart (novoLOG) injection 0-5 Units  0-5 Units Subcutaneous QHS Matilde Haymaker, MD   2 Units at 03/04/19 2155  . insulin aspart (novoLOG) injection 0-9 Units  0-9 Units Subcutaneous TID AC Kinnie Feil, MD   3 Units at 03/05/19 [phone]  . insulin aspart (novoLOG) injection 3 Units  3 Units Subcutaneous BID WC Rory Percy, DO   3 Units at 03/05/19 0851  . insulin glargine (LANTUS) injection 22 Units  22 Units Subcutaneous Daily Rory Percy, DO   22 Units at 03/04/19 0909  . lithium carbonate capsule 300 mg  300 mg Oral BID WC Rory Percy, DO   300 mg at 03/05/19 0847  . metFORMIN (GLUCOPHAGE-XR) 24 hr  tablet 500 mg  500 mg Oral Q breakfast Rory Percy, DO   500 mg at 03/05/19 0847  . pantoprazole (PROTONIX) EC tablet 40 mg  40 mg Oral Daily Rory Percy, DO   40 mg at 03/05/19 0848  . polyethylene glycol (MIRALAX / GLYCOLAX) packet 17 g  17 g Oral Daily PRN Rory Percy, DO      . tamsulosin (FLOMAX) capsule 0.4 mg  0.4 mg Oral QPC breakfast Rory Percy, DO   0.4 mg at 03/05/19 4166    Musculoskeletal: Strength & Muscle Tone: within normal limits Gait & Station: normal Patient leans: N/A  Psychiatric Specialty Exam: Physical Exam  Nursing note and vitals reviewed. Constitutional: He is oriented to person, place, and time. He appears well-developed and well-nourished.  HENT:  Head: Normocephalic.  Neck: Normal range of motion.  Respiratory: Effort normal.  Musculoskeletal: Normal range of motion.  Neurological: He is alert and oriented to person, place, and time.  Psychiatric: His speech is normal. Judgment and thought content normal. His affect is blunt. He is actively hallucinating. Cognition and memory are normal.    Review of Systems  Psychiatric/Behavioral: Positive for hallucinations.  All other systems reviewed and are negative.   Blood pressure (!) 141/80, pulse 70, temperature 97.6 F (36.4 C), temperature source Oral, resp. rate  18, height  (1.854 m), weight 111 kg, SpO2 100 %.Body mass index is 32.29 kg/m.  General Appearance: Casual  Eye Contact:  Good  Speech:  Slurred  Volume:  Normal  Mood:  Anxious  Affect:  Blunt  Thought Process:  Coherent and Descriptions of Associations: Intact  Orientation:  Full (Time, Place, and Person)  Thought Content:  Hallucinations: Visual  Suicidal Thoughts:  No  Homicidal Thoughts:  No  Memory:  Immediate;   Fair Recent;   Fair Remote;   Fair  Judgement:  Fair  Insight:  Fair  Psychomotor Activity:  Normal  Concentration:  Concentration: Fair and Attention Span: Fair  Recall:  [employer] of Knowledge:   Fair  Language:  Fair  Akathisia:  No  Handed:  Right  AIMS (if indicated):     Assets:  Housing Leisure Time Physical Health Resilience  ADL's:  Intact  Cognition:  WNL  Sleep:       Treatment Plan Summary: Schizoaffective disorder, bipolar type: -Continue Depakote 500 mg in the and 2000 mg in the pm -Ordered Valproic acid level, 87, stable -Continue Lithium 300 mg BID -Ordered Lithium level, slightly below the normal range, will let outpatient manage this -DC HAldol and start Olanzapine  po qhs for positive symptoms of schizoaffective.  -Follow up with [name] for next haldol dec injection on 11/2 400 mg, follow up information in discharge instructions  EPS: -Continue Cogentin 1 mg BID  Anxiety: -Continue gabapentin 600 mg TID -Continue hydroxyzine 25 mg every six hours PRN  Disposition: No evidence of imminent risk to self or others at present.   Will continue to monitor at this time. Patient can receive most services outpatient at [facility], and does not require inpatient admission. WIll continue to monitor since starting new medication regimen.   Maryagnes Amos, FNP 03/05/2019 11:35 AM

## 2019-03-06 LAB — CREATININE, SERUM
Creatinine, Ser: 1.77 mg/dL — ABNORMAL HIGH (ref 0.61–1.24)
GFR calc Af Amer: 47 mL/min — ABNORMAL LOW (ref >60)
GFR calc non Af Amer: 40 mL/min — ABNORMAL LOW (ref >60)

## 2019-03-06 LAB — GLUCOSE, CAPILLARY
Glucose-Capillary: 163 mg/dL — ABNORMAL HIGH (ref 70–99)
Glucose-Capillary: 164 mg/dL — ABNORMAL HIGH (ref 70–99)
Glucose-Capillary: 205 mg/dL — ABNORMAL HIGH (ref 70–99)
Glucose-Capillary: 191 mg/dL — ABNORMAL HIGH (ref 70–99)

## 2019-03-06 NOTE — Progress Notes (Signed)
Physical Therapy Treatment Patient Details Name: Dylan Jordan MRN: [id number] DOB: [birth year] Today's Date: 03/06/2019    History of Present Illness Pt is a 63 y/o male admitted secondary to hallucinations and AMS likely secondary to medications. Pt also with hyperkalemia. PMH includes schizophrenia and DM.     PT Comments    Pt was more alert and appropriate today, more interactive with his care and attending to instructions for both gait and exercise.  Has a plan to get follow up care but unsure if he is going to receive general rehab or inpt psych care.  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     Follow Up Recommendations  Other (comment)(inpt psych care)     Equipment Recommendations  Rolling walker with 5" wheels    Recommendations for Other Services       Precautions / Restrictions Precautions Precautions: Fall Precaution Comments: safety with RW Restrictions Weight Bearing Restrictions: No    Mobility  Bed Mobility               General bed mobility comments: standing when PT arrived  Transfers Overall transfer level: Modified independent Equipment used: None Transfers: Sit to/from [facility] Sit to Stand: Supervision Stand pivot transfers: Min guard(only due to lack of awareness of what is behind him)       General transfer comment: supervision for safety  Ambulation/Gait Ambulation/Gait assistance: Min guard(for safety) Gait Distance (Feet): 350 Feet Assistive device: Rolling walker (2 wheeled);1 person hand held assist Gait Pattern/deviations: Step-through pattern;Wide base of support Gait velocity: Decreased Gait velocity interpretation: <1.31 ft/sec, indicative of household ambulator General Gait Details: clearing obstacles but is still close with no outright contact   Stairs             Wheelchair Mobility    Modified Rankin (Stroke  Patients Only)       Balance Overall balance assessment: Needs assistance Sitting-balance support: Feet supported Sitting balance-Leahy Scale: Good     Standing balance support: No upper extremity supported;During functional activity Standing balance-Leahy Scale: Good Standing balance comment: RW for fair to less than fair gait balance                            Cognition Arousal/Alertness: Awake/alert Behavior During Therapy: Flat affect(more socially appropriate today) Overall Cognitive Status: No family/caregiver present to determine baseline cognitive functioning                                 General Comments: garbled speec and fast paced      Exercises General Exercises - Lower Extremity Gluteal Sets: Standing;10 reps Hip ABduction/ADduction: Standing;10 reps Toe Raises: Standing;20 reps    General Comments General comments (skin integrity, edema, etc.): pt is making progress both with appropriateness of speech and interaction with others, as well as his tolerance for mobility with appropriate use of RW      Pertinent Vitals/Pain Pain Assessment: No/denies pain    Home Living                      Prior Function            PT Goals (current goals can now be found in the care plan section) Acute Rehab PT Goals Patient Stated Goal: get  a longer walk done Progress towards PT goals: Progressing toward goals    Frequency    Min 3X/week      PT Plan Current plan remains appropriate    Co-evaluation              AM-PAC PT "6 Clicks" Mobility   Outcome Measure  Help needed turning from your back to your side while in a flat bed without using bedrails?: None Help needed moving from lying on your back to sitting on the side of a flat bed without using bedrails?: None Help needed moving to and from a bed to a chair (including a wheelchair)?: None Help needed standing up from a chair using your arms (e.g., wheelchair  or bedside chair)?: A Little Help needed to walk in hospital room?: A Little Help needed climbing 3-5 steps with a railing? : A Little 6 Click Score: 21    End of Session Equipment Utilized During Treatment: Gait belt Activity Tolerance: Patient tolerated treatment well Patient left: in chair;with call bell/phone within reach Nurse Communication: Mobility status PT Visit Diagnosis: Unsteadiness on feet (R26.81);Muscle weakness (generalized) (M62.81)(weakness is less an issue than loss of ROM on LLE due to TKA)     Time: 3267-1245 PT Time Calculation (min) (ACUTE ONLY): 29 min  Charges:  $Gait Training: 8-22 mins $Therapeutic Exercise: 8-22 mins                    Ivar Drape 03/06/2019, 11:54 AM   Samul Dada, PT MS Acute Rehab Dept. Number: [facility] [id number] and [facility] [phone]

## 2019-03-06 NOTE — Progress Notes (Signed)
Patient refused 4pm vitals.

## 2019-03-06 NOTE — Plan of Care (Signed)
  Problem: Education: Goal: Knowledge of General Education information will improve Description Including pain rating scale, medication(s)/side effects and non-pharmacologic comfort measures Outcome: Progressing   

## 2019-03-06 NOTE — Progress Notes (Signed)
Family Medicine Teaching Service Daily Progress Note Intern Pager: [phone]  Patient name: Dylan Jordan Medical record number: [id number] Date of birth: 06-Jun-1955 Age: 63 y.o. Gender: male  Primary Care Provider: Shirley, [place], DO Consultants: Psychiatry, [name] Code Status: full code   Pt Overview and Major Events to Date: 10/8-admitted 10/16- patient recommended for SNF by PT, patient preferred to stay hospitalized after discharge order placed with plan to go to SNF  10/20- no available [place] SNF, patient sister asked for preferred facilities in [place], [place] 10/21- re-evaluated by Psychiatry, no inpatient criteria, discontinued Haldol and added Olanzapine 5mg   Assessment and Plan: Aashir Umholtz a 63 y.o.malepresenting with hallucinations and elevated potassium.PMH is significant for schizophrenia, type 2 diabetes, CKD, hypertension, hyperlipidemia, Hx alcohol polysubstance abuse.  AMS 2/2 psychiatricmedicationsschizophrenia Patient reports that he continues to have visual hallucinations, patient actively hallucinating while in the room and stating that "of seeing a spider crawling up the curtain".  Patient is requesting to be restarted on Haldol 10 mg, adding that he was on this dose before and had no problems with hallucinations.  We explained side effects associated with Haldol and patient's need for treatment to prevent symptoms associated with tardive dyskinesia.  Patient verbalized understanding and stated that he would rather have a tremor then see me hallucinations.  He reports that he previously had a tremor in 1 hand and feel that that was easier to manage this immunizations. Patient has been reevaluated by psychiatry and continues to not meet criteria for inpatient treatment, however with increased hallucinations, it was recommended the patient be discontinued on Haldol and started on olanzapine 5 mg. -continue olanzapine 5 mg, cogentin, depakote, and  lithium -Discontinue Haldol -Plan for follow-up outpatient with [facility] -Patient to receive next scheduled injection of Haldol in November -will monitor electrolytes weekly as patient remains inpatient due to awaiting placement in [place] medications include hydroxyzine -Hydroxyzine25 mg every 6 hours as needed  Type 2 diabeteswith peripheral neuropathy, improving control  Home medications include Lantus45 units. On gabapentin for neuropathy in his feet.BG 128-188 overnight.  -Continue Metformin 500 mg - Continue sensitive sliding scale - Lantus 22 units of basal insulindaily - bedtime coverage 1-5 units SSI -continue 3 units breakfast and dinner -Continue gabapentin 600 mg 3 times daily  CKDstage III Lastcreatinine 1.41. Baseline appears to be ~1.3-1.5.  -Replace electrolytes as indicated -Avoid nephrotoxic agents, ensure adequate renal perfusion -Weekly BMPs while awaiting placement  Hypertension Most recent blood pressure WNL. Home medications include amlodipine 10 mg -Continue amlodipine  Hyperlipidemia Medications include atorvastatin 20 mg -Continue atorvastatin  History of alcohol/polysubstance abuse, stable Patientwith recent1.54-month hospital stay at behavioral health. Not concerned for any withdrawal at this point. -Recommendcessation to patient during discharge  GERD, stable All medications include famotidine 20 mg, Protonix 40 mg. -Continue home Protonix.  Gout, stable Patient on allopurinol at home medication -Continue home medications  Constipation-improved -Continue MiraLAX  BPH, stable With mixed incontinence.Homemedications include finasteride, tamsulosin -Continue home medications  FEN/GI:Heart healthy/carb modified PPx: Lovenox  Disposition: Patient is medically stable for discharge, patient pending SNF placement, currently no facilities available in [facility] and social work working to have  patient placed in [facility]  Subjective:  Patient continues to request his Haldol be restarted and increased to 10 mg per patient that he will be without visual hallucination his tremor that he previously experienced while Haldol.  Patient reports he continues to have visual hallucinations but otherwise feels well.  Objective:  Temp:  [98 F (36.7 C)-98.1 F (36.7 C)] 98 F (36.7 C) (10/22 0420) Pulse Rate:  [67-78] 78 (10/22 0420) Resp:  [16-18] 16 (10/22 0420) BP: (108-145)/(59-84) 134/84 (10/22 0420) SpO2:  [95 %-100 %] 100 % (10/22 0420)  Physical Exam: General: Male appearing stated age sitting on edge of bed in no acute distress Cardio: Regular rate murmurs, gallops or friction rub Pulm: Clear to auscultation bilaterally without wheezing, no crackles appreciated, patient stable on room air Abdomen: Abdomen is full, nontender, bowel sounds are present Extremities: No lower extremity edema appreciated Neuro: Patient is alert, oriented to self, month, location, city and state and situation but not year  Laboratory: No results for input(s): WBC, HGB, HCT, PLT in the last 168 hours. Recent Labs  Lab 03/01/19 0430 03/03/19 0630 03/06/19 0448  NA 135 139  --   K 4.4 4.5  --   CL 106 108  --   CO2 22 24  --   BUN 21 21  --   CREATININE 1.41* 1.56* 1.77*  CALCIUM 8.9 9.5  --   GLUCOSE 211* 172*  --     Imaging/Diagnostic Tests: No new imaging   Nicki Guadalajara, MD 03/06/2019, 9:25 AM PGY-1, [facility] Family Medicine FPTS Intern pager: [phone], text pages welcome

## 2019-03-07 LAB — GLUCOSE, CAPILLARY
Glucose-Capillary: 221 mg/dL — ABNORMAL HIGH (ref 70–99)
Glucose-Capillary: 182 mg/dL — ABNORMAL HIGH (ref 70–99)
Glucose-Capillary: 136 mg/dL — ABNORMAL HIGH (ref 70–99)
Glucose-Capillary: 241 mg/dL — ABNORMAL HIGH (ref 70–99)

## 2019-03-07 MED ORDER — INSULIN ASPART 100 UNIT/ML ~~LOC~~ SOLN
0.0000 [IU] | Freq: Three times a day (TID) | SUBCUTANEOUS | Status: DC
  Administered 2019-03-08: 2 [IU] via SUBCUTANEOUS
  Administered 2019-03-08 (×2): 5 [IU] via SUBCUTANEOUS
  Administered 2019-03-09 (×2): 2 [IU] via SUBCUTANEOUS
  Administered 2019-03-09 – 2019-03-10 (×2): 5 [IU] via SUBCUTANEOUS

## 2019-03-07 NOTE — Progress Notes (Addendum)
Family Medicine Teaching Service Daily Progress Note Intern Pager: [phone]  Patient name: Dylan Jordan Medical record number: [id number] Date of birth: 08/21/1955 Age: 63 y.o. Gender: male  Primary Care Provider: Shirley, [place], DO Consultants: Psychiatry, [name] Code Status: full code   Pt Overview and Major Events to Date: 10/8-admitted 10/16- patient recommended for SNF by PT, patient preferred to stay hospitalized after discharge order placed with plan to go to SNF  10/20- no available [place] SNF, patient sister asked for preferred facilities in [place], [place] 10/21- re-evaluated by Psychiatry, no inpatient criteria, discontinued Haldol and added Olanzapine 5mg   Assessment and Plan: Devynn Scheff a 63 y.o.malepresenting with hallucinations and elevated potassium.PMH is significant for schizophrenia, type 2 diabetes, CKD, hypertension, hyperlipidemia, Hx alcohol polysubstance abuse.  AMS 2/2 psychiatricmedicationsschizophrenia Mr. Urwin remains in the hospital due to difficulty placing him in a skilled nursing facility.  He continues to report hallucinations.  Most recently, psychiatry assessed in change medications on 10/21.  His current medications are found below.  He reports no hallucinations today although he has only been out for about 1 hour. -Reach out to physical therapy regarding appropriateness for home health -Depakote 2.5 g daily -Lithium 300 mg twice daily -Olanzapine 5 mg daily -will monitor electrolytes weekly as patient remains inpatient due to awaiting placement in [place] medications include hydroxyzine -Hydroxyzine25 mg every 6 hours as needed  Type 2 diabeteswith peripheral neuropathy, improving control  CBGs 163-205 in the past 24 hours.  22 units glargine, 13 units aspart given in the past 24 hours. - Metformin 500 mg - Lantus 22 units of basal insulindaily - Aspart 3 units 3 times daily with meals - SSI  sensitive with meals - bedtime coverage 1-5 units SSI -gabapentin 600 mg 3 times daily  CKDstage III Baseline appears to be ~1.3-1.5.  Creatinine 1.7 on 10/22.  Euvolemic on exam today.  Will encourage p.o. intake of liquids. -Encourage adequate p.o. intake  Hypertension Most recent blood pressure WNL. Home medications include amlodipine 10 mg -Continue amlodipine  Hyperlipidemia Medications include atorvastatin 20 mg -Continue atorvastatin  History of alcohol/polysubstance abuse, stable Patientwith recent1.33-month hospital stay at behavioral health. Not concerned for any withdrawal at this point. -Recommendcessation to patient during discharge  GERD, stable All medications include famotidine 20 mg, Protonix 40 mg. -Continue home Protonix.  Gout, stable Patient on allopurinol at home medication -Continue home medications  Constipation-improved -Continue MiraLAX  BPH, stable With mixed incontinence.Homemedications include finasteride, tamsulosin -Continue home medications  FEN/GI:Heart healthy/carb modified PPx: Lovenox  Disposition: Physical therapy notes that he would meet requirements for home health otherwise current medical needs would require 24-hour supervision.  The sister is not able to provide 24-hour supervision.  We will continue to seek out SNF placement.  Subjective:  No acute events overnight.  Mr. Conchas was seated comfortably on the edge of his bed this morning eating breakfast while we spoke.  He reports that he has not experienced any hallucinations this morning.  He noted significant hallucinations including snake and hair balls yesterday.  Hallucinations about have also disturbing because he could feel that her boss brushing past him.  He has not noted any hallucinations this morning.  Objective: Temp:  [97.7 F (36.5 C)-98.3 F (36.8 C)] 97.7 F (36.5 C) (10/23 0541) Pulse Rate:  [65-73] 70 (10/23 0541) Resp:  [18] 18 (10/23  0541) BP: (105-144)/(66-93) 132/93 (10/23 0541) SpO2:  [99 %-100 %] 100 % (10/23 0541)  Physical Exam: General: Alert  and cooperative.  Seated in the edge of his bed.  Eating breakfast comfortably.  Appropriately interactive. HEENT: Neck non-tender without lymphadenopathy, masses or thyromegaly Cardio: Normal S1 and S2, no S3 or S4. Rhythm is regular. No murmurs or rubs. Pulm: Clear to auscultation bilaterally, no crackles, wheezing, or diminished breath sounds. Normal respiratory effort Abdomen: Bowel sounds normal. Abdomen soft and non-tender.  Extremities: 1+ peripheral edema.  Warm, dry extremities.  Strong radial pulse.  Laboratory: No results for input(s): WBC, HGB, HCT, PLT in the last 168 hours. Recent Labs  Lab 03/01/19 0430 03/03/19 0630 03/06/19 0448  NA 135 139  --   K 4.4 4.5  --   CL 106 108  --   CO2 22 24  --   BUN 21 21  --   CREATININE 1.41* 1.56* 1.77*  CALCIUM 8.9 9.5  --   GLUCOSE 211* 172*  --     Imaging/Diagnostic Tests: No results found.    Mirian Mo, MD 03/07/2019, 6:31 AM PGY-2, [place] Family Medicine FPTS Intern pager: [phone], text pages welcome

## 2019-03-07 NOTE — TOC Progression Note (Signed)
Transition of Care [facility]) - Progression Note    Patient Details  Name: Alby Schwabe MRN: [id number] Date of Birth: 1955/06/22  Transition of Care [facility]) CM/SW [place], [place] Phone Number: 03/07/2019, 11:08 AM  Clinical Narrative:     [name] called Bee with [place] [facility] about PASSR screen. [name] left a voicemail at [phone], awaiting for a return phone call.   [name] received a call from the patient's sister. She provided the following facilities in [place]: [facility] and [place], [facility]), and [place] and [place].   [name] will fax the referrals to the facilities for bed offer. [name] will continue to follow.   Expected Discharge Plan: [facility](HH will not pay for therapies. Patient may be able to get other [facility] services if paid for by Medicaid) Barriers to Discharge: No SNF bed(Talked with patient regarding SNF versus home)  Expected Discharge Plan and Services Expected Discharge Plan: [facility](HH will not pay for therapies. Patient may be able to get other [facility] services if paid for by [facility]) In-house Referral: Clinical Social Work     Living arrangements for the past 2 months: Apartment Expected Discharge Date: 02/28/19                                     Social Determinants of Health (SDOH) Interventions    Readmission Risk Interventions No flowsheet data found.

## 2019-03-07 NOTE — Progress Notes (Signed)
Call from RN stating that patient is refusing p.m. medications.  Went to assess patient, he is currently asleep.  We will continue to monitor.   Carollee Leitz, MD

## 2019-03-07 NOTE — Progress Notes (Signed)
Nursing paged in the afternoon with regard to Dylan Jordan hallucinations.  Nursing reported that he had been having nondistressing hallucinations during the day and asked if it would be necessary to titrate his psychiatric medications.  S) Dylan Jordan was assessed bedside at 8 PM.  He reports distressing hallucinations of hair balls flying toward him.  These are distressing because they are both visual and tactile.  He reports that he feels that her boss hitting him and pressing against him.  He asks multiple times why he cannot be restarted on Haldol.  He is convinced that Haldol has worked well for him previously.  O) General: Sleeping on arrival.  Easily arousable.  Interactive. Psych: Pressured speech, mildly anxious appearing.  Constant looking around the room although no obvious hallucinations at this time.  A/P) Mr. Barreiro is now complaining of distressing hallucinations.  His medications were recently modified by psychiatry on 10/21.  It may take more time to allow his medications to take effect but Dylan Jordan does appear mildly distressed by his current visual and tactile hallucinations.  We will continue to monitor through the weekend and considering reaching out to psychiatry for additional medication modification.

## 2019-03-07 NOTE — TOC Progression Note (Signed)
Transition of Care [facility]) - Progression Note    Patient Details  Name: Dylan Jordan MRN: [id number] Date of Birth: November 06, 1955  Transition of Care [facility]) CM/SW [place], [place] Phone Number: 03/07/2019, 4:09 PM  Clinical Narrative:     [facility] contacted the [name] and they are not able to make an offer.   [name] completed PASSR screening with Pamala Hurry from [facility]. [name] will follow up with facilities over the weekend for bed offers.   [name] will continue to follow.   Expected Discharge Plan: [facility](HH will not pay for therapies. Patient may be able to get other [facility] services if paid for by Medicaid) Barriers to Discharge: No SNF bed(Talked with patient regarding SNF versus home)  Expected Discharge Plan and Services Expected Discharge Plan: [facility](HH will not pay for therapies. Patient may be able to get other [facility] services if paid for by [facility]) In-house Referral: Clinical Social Work     Living arrangements for the past 2 months: Apartment Expected Discharge Date: 02/28/19                                     Social Determinants of Health (SDOH) Interventions    Readmission Risk Interventions No flowsheet data found.

## 2019-03-07 NOTE — Progress Notes (Signed)
Pt resting in bed and easily aroused. Withdrawn and irritable. Refusing to take any medications states, "who says I need to take them?" I educated patient on importance of taking medications. Became more irritated with encouragement of him taking his medications.  MD notified.

## 2019-03-08 LAB — GLUCOSE, CAPILLARY
Glucose-Capillary: 135 mg/dL — ABNORMAL HIGH (ref 70–99)
Glucose-Capillary: 205 mg/dL — ABNORMAL HIGH (ref 70–99)
Glucose-Capillary: 217 mg/dL — ABNORMAL HIGH (ref 70–99)
Glucose-Capillary: 142 mg/dL — ABNORMAL HIGH (ref 70–99)

## 2019-03-08 NOTE — Progress Notes (Signed)
Pt awake and agreeable to take medications now

## 2019-03-08 NOTE — Progress Notes (Signed)
Family Medicine Teaching Service Daily Progress Note Intern Pager: [phone]  Patient name: Dylan Jordan Medical record number: [id number] Date of birth: 1955-10-06 Age: 63 y.o. Gender: male  Primary Care Provider: Shirley, [place], DO Consultants: Psychiatry, [name] Code Status: full code   Pt Overview and Major Events to Date: 10/8-admitted 10/16- patient recommended for SNF by PT, patient preferred to stay hospitalized after discharge order placed with plan to go to SNF  10/20- no available [place] SNF, patient sister asked for preferred facilities in [place], [place] 10/21- re-evaluated by Psychiatry, no inpatient criteria, discontinued Haldol and added Olanzapine 5mg  10/23- [facility] unable to offer bed at this time, SW to continue with attempts for SNF placement   Assessment and Plan: Dylan Jordan a 63 y.o.malepresenting with hallucinations and elevated potassium.PMH is significant for schizophrenia, type 2 diabetes, CKD, hypertension, hyperlipidemia, Hx alcohol polysubstance abuse.  AMS 2/2 psychiatricmedicationsschizophrenia Dylan Jordan remains in the hospital due to difficulty placing him in a skilled nursing facility.  Patient noted to have previously experience distressing visual, auditory and tactile hallucinations, patient denies all three of these this morning. Patient would like to be re-started on Haldol as it has worked well for him in the past. Patient is due for Haldol injection at the beginning of Nov. Psychiatry re-evaluated the patient on 10/21 and discontinued Haldol and started Zyprexa. 11/21  His current medications are found below.   -continue Depakote 2.5 g daily -continue Lithium 300 mg twice daily -continue Olanzapine 5 mg daily -will monitor electrolytes weekly as patient remains inpatient due to awaiting placement in [facility]  Anxiety Home medications include hydroxyzine -Hydroxyzine25 mg every 6 hours as needed  Type 2 diabeteswith  peripheral neuropathy, improving control  CBGs 136-241  in the past 24 hours.  22 units glargine, 13 units aspart given in the past 24 hours. - Metformin 500 mg - Lantus 22 units of basal insulindaily - Aspart 3 units with meals - SSI moderate with meals - bedtime coverage 1-5 units SSI -gabapentin 600 mg 3 times daily   CKDstage III Baseline appears to be ~1.3-1.5.  Creatinine 1.7 on 10/22. Will encourage p.o. intake of liquids. -Encourage adequate p.o. intake  Hypertension Overnight blood pressures WNL with one episode of elevated diastolic pressure of 93. Home medications include amlodipine 10 mg -Continue amlodipine  Hyperlipidemia Medications include atorvastatin 20 mg -Continue atorvastatin  History of alcohol/polysubstance abuse, stable Patientwith recent1.13-month hospital stay at behavioral health. Not concerned for any withdrawal at this point. -Recommendcessation to patient during discharge  GERD, stable All medications include famotidine 20 mg, Protonix 40 mg. -Continue home Protonix.  Gout, stable Patient on allopurinol at home medication -Continue home medications  Constipation-improved -Continue MiraLAX  BPH, stable With mixed incontinence.Homemedications include finasteride, tamsulosin -Continue home medications  FEN/GI:Heart healthy/carb modified PPx: Lovenox  Disposition: Physical therapy notes that he would meet requirements for home health otherwise current medical needs would require 24-hour supervision.  The sister is not able to provide 24-hour supervision. Sister has requested [facility] in [place] but no offer available as of 10/23, will continue to search for available facilities.   Subjective:  Patient reports that yesterday was a bad day but today is going well so far. He denies any visual hallucinations as of yet. Patient adds that he has not experienced any auditory hallucinations during this admission.    Objective: Temp:  [97.5 F (36.4 C)-97.6 F (36.4 C)] 97.6 F (36.4 C) (10/24 0915) Pulse Rate:  [66-68] 68 (10/24  0915) Resp:  [16-20] 18 (10/24 0915) BP: (121-142)/(63-85) 142/85 (10/24 0915) SpO2:  [96 %-98 %] 97 % (10/24 0915)  Physical Exam: General: male appearing stated age in NAD, lying in bed under blankets  HEENT: moist mucous membranes, no rhinorrhea  Cardio: RRR without murmurs, gallops or friction rubs  Pulm: CTAB without wheezing or crackles  Abdomen: full but non-tender, bowel sounds present throughout  Extremities: no LE edema or deformities appreciated   Laboratory: No results for input(s): WBC, HGB, HCT, PLT in the last 168 hours. Recent Labs  Lab 03/03/19 0630 03/06/19 0448  NA 139  --   K 4.5  --   CL 108  --   CO2 24  --   BUN 21  --   CREATININE 1.56* 1.77*  CALCIUM 9.5  --   GLUCOSE 172*  --     Imaging/Diagnostic Tests: No results found.    Stark Klein, MD 03/08/2019, 11:03 AM PGY-2, [place] Intern pager: [phone], text pages welcome

## 2019-03-09 LAB — GLUCOSE, CAPILLARY
Glucose-Capillary: 150 mg/dL — ABNORMAL HIGH (ref 70–99)
Glucose-Capillary: 245 mg/dL — ABNORMAL HIGH (ref 70–99)
Glucose-Capillary: 122 mg/dL — ABNORMAL HIGH (ref 70–99)
Glucose-Capillary: 222 mg/dL — ABNORMAL HIGH (ref 70–99)

## 2019-03-09 NOTE — Progress Notes (Signed)
Family Medicine Teaching Service Daily Progress Note Intern Pager: [phone]  Patient name: Dylan Jordan Medical record number: [id number] Date of birth: 1956/02/11 Age: 63 y.o. Gender: male  Primary Care Provider: Shirley, [place], DO Consultants: Psychiatry, [name] Code Status: Full  Pt Overview and Major Events to Date:  10/8-admitted 10/16- patient recommended for SNF by PT, patient preferred to stay hospitalized after discharge order placed with plan to go to SNF  10/20- no available [place] SNF, patient sister asked for preferred facilities in [place], [place] 10/21- re-evaluated by Psychiatry, no inpatient criteria, discontinued Haldol and added Olanzapine 5mg  10/23- [facility] unable to offer bed at this time, SW to continue with attempts for SNF placement   Assessment and Plan: Dylan Jordan a 63 y.o.malepresenting with hallucinations and elevated potassium.PMH is significant for schizophrenia, type 2 diabetes, CKD, hypertension, hyperlipidemia, Hx alcohol and polysubstance abuse.  AMS 2/2 psychiatricmedicationsschizophrenia, improved Dylan Jordan remains in the hospital pending placement in a skilled nursing facility. No hallucinations in the past 3 days or so. Remains stable off Haldol and on Zyprexa.   -continue Depakote 2.5 g daily -continue Lithium 300 mg twice daily -continue Olanzapine 5 mg daily  -will monitor electrolytes weekly as patient remains inpatient due to awaiting placement - BMP ordered for 10/26   Anxiety, stable in the hospital Home medications include hydroxyzine -No hydroxyzine ordered this admission, see med list above  Type 2 diabeteswith peripheral neuropathy, improving control  CBGs 136-241  in the past 24 hours.  22 units glargine, 13 units aspart given in the past 24 hours. - Metformin 500 mg - Lantus 22units of basal insulindaily - Aspart 3 units with meals - SSI moderate with meals - bedtimecoverage 1-5 units SSI PRN -  gabapentin 600 mg 3 times daily   CKDstage III Baseline appears to be ~1.3-1.5.  Creatinine 1.7 on 10/22. Will encourage p.o. intake of liquids. -Encourage adequate p.o. intake  Hypertension Overnight blood pressures 121-126/81-85. Home medications include amlodipine 10 mg -Continue amlodipine 10  Hyperlipidemia Medications include atorvastatin 20 mg -Continue atorvastatin  History of alcohol/polysubstance abuse, stable Patientwith recent1.43-month hospital stay at behavioral health. Not concerned for any withdrawal at this point. -Recommendcessation to patient during discharge  GERD, stable All medications include famotidine 20 mg and Protonix 40 mg. -Continue home Protonix.  Gout, stable Patient on allopurinol at home medication -Continue home medications  Constipation-improved -Continue MiraLAX  BPH, stable With mixed incontinence.Homemedications include finasteride, tamsulosin -Continue home medications  FEN/GI:Heart healthy/carb modified PPx: Lovenox  Disposition: Physical therapy notes that he would meet requirements for home health otherwise current medical needs would require 24-hour supervision.  The sister is not able to provide 24-hour supervision. Sister has requested [facility] in [place] but no offer available as of 10/23, will continue to search for available facilities   Subjective:  Dylan Jordan was seen this morning sleeping in bed, I startled him when he awoke but he was very pleasant. Denies any hallucinations, states he slept well last night, no concerns or complaints this morning. Very pleasant patient.  Objective: Temp:  [97.6 F (36.4 C)-97.8 F (36.6 C)] 97.7 F (36.5 C) (10/24 2108) Pulse Rate:  [66-84] 84 (10/24 2108) Resp:  [16-18] 18 (10/24 2108) BP: (118-142)/(74-85) 121/85 (10/24 2108) SpO2:  [94 %-99 %] 99 % (10/24 2108) Physical Exam: General: no apparent distress Cardiovascular: RRR S1S2 present, no murmurs  appreciated Respiratory: normal work of breathing, CTA bilaterally Extremities: no edema appreciated, 2+ DP pulses to BLLEs  Laboratory:  No results for input(s): WBC, HGB, HCT, PLT in the last 168 hours. Recent Labs  Lab 03/03/19 0630 03/06/19 0448  NA 139  --   K 4.5  --   CL 108  --   CO2 24  --   BUN 21  --   CREATININE 1.56* 1.77*  CALCIUM 9.5  --   GLUCOSE 172*  --    No new labs  Imaging/Diagnostic Tests: Ct Head Wo Contrast  Result Date: 02/20/2019 CLINICAL DATA:  Altered level consciousness. EXAM: CT HEAD WITHOUT CONTRAST TECHNIQUE: Contiguous axial images were obtained from the base of the skull through the vertex without intravenous contrast. COMPARISON:  12/23/2018 FINDINGS: Brain: No evidence of acute infarction, hemorrhage, extra-axial collection, ventriculomegaly, or mass effect. Generalized cerebral atrophy. Periventricular white matter low attenuation likely secondary to microangiopathy. Vascular: Cerebrovascular atherosclerotic calcifications are noted. Skull: Negative for fracture or focal lesion. Sinuses/Orbits: Visualized portions of the orbits are unremarkable. Visualized portions of the paranasal sinuses and mastoid air cells are unremarkable. Other: None. IMPRESSION: 1. No acute intracranial pathology. Electronically Signed   By: Kathreen Devoid   On: 02/20/2019 16:31   Mr Brain Wo Contrast  Result Date: 02/21/2019 CLINICAL DATA:  Encephalopathy. EXAM: MRI HEAD WITHOUT CONTRAST TECHNIQUE: Multiplanar, multiecho pulse sequences of the brain and surrounding structures were obtained without intravenous contrast. COMPARISON:  Head CT 02/20/2019 FINDINGS: The examination had to be discontinued prior to completion due to the patient's confusion and attempts to remove the head coil towards the end of the examination. The coronal T2 sequence is severely motion degraded and incomplete. All of the other routine noncontrast brain sequences were obtained although some are  moderately motion degraded. Brain: There is no evidence of acute infarct, intracranial hemorrhage, mass, midline shift, or extra-axial fluid collection. Cerebral atrophy is mildly advanced for age. Periventricular white matter T2 hyperintensities are nonspecific but compatible with minimal chronic small vessel ischemic disease. Vascular: Major intracranial vascular flow voids are preserved. Skull and upper cervical spine: No suspicious marrow lesion identified. Sinuses/Orbits: Unremarkable orbits. Clear paranasal sinuses. Small right mastoid effusion. Other: None. IMPRESSION: 1. Motion degraded examination without evidence of acute intracranial abnormality. 2. Mild cerebral atrophy. Electronically Signed   By: Logan Bores M.D.   On: 02/21/2019 10:49   US Scrotum W/doppler  Result Date: 02/21/2019 CLINICAL DATA:  Right testicular fullness and vague pain. EXAM: SCROTAL ULTRASOUND DOPPLER ULTRASOUND OF THE TESTICLES TECHNIQUE: Complete ultrasound examination of the testicles, epididymis, and other scrotal structures was performed. Color and spectral Doppler ultrasound were also utilized to evaluate blood flow to the testicles. COMPARISON:  None. FINDINGS: Right testicle Measurements: 3.5 x 2.5 x 2.2 cm. 6 mm complex cyst without internal vascularity in the superolateral aspect of the right testicle. No microlithiasis visualized. Left testicle Measurements: 3.8 x 2.2 x 2.5 cm. No mass or microlithiasis visualized. Right epididymis:  Normal in size and appearance. Left epididymis:  Normal in size and appearance. Hydrocele:  Large bilateral hydroceles. Varicocele:  None visualized. Pulsed Doppler interrogation of both testes demonstrates normal low resistance arterial and venous waveforms bilaterally. IMPRESSION: 1. No acute abnormality.  Large bilateral hydroceles. 2. 6 mm complex cyst within the right testicle. This is almost certainly benign, but given the complexity, follow-up testicular ultrasound in 6 months is  recommended to evaluate for interval change. Electronically Signed   By: Titus Dubin M.D.   On: 02/21/2019 21:17     Daisy Floro, DO 03/09/2019, 5:54 AM PGY-2, [place]  [place] Intern pager: [phone], text pages welcome

## 2019-03-10 LAB — CBC
HCT: 37.1 % — ABNORMAL LOW (ref 39.0–52.0)
Hemoglobin: 11.3 g/dL — ABNORMAL LOW (ref 13.0–17.0)
MCH: 27.8 pg (ref 26.0–34.0)
MCHC: 30.5 g/dL (ref 30.0–36.0)
MCV: 91.2 fL (ref 80.0–100.0)
Platelets: 277 10*3/uL (ref 150–400)
RBC: 4.07 MIL/uL — ABNORMAL LOW (ref 4.22–5.81)
RDW: 13.9 % (ref 11.5–15.5)
WBC: 5.9 10*3/uL (ref 4.0–10.5)
nRBC: 0 % (ref 0.0–0.2)

## 2019-03-10 LAB — GLUCOSE, CAPILLARY
Glucose-Capillary: 205 mg/dL — ABNORMAL HIGH (ref 70–99)
Glucose-Capillary: 181 mg/dL — ABNORMAL HIGH (ref 70–99)
Glucose-Capillary: 213 mg/dL — ABNORMAL HIGH (ref 70–99)
Glucose-Capillary: 185 mg/dL — ABNORMAL HIGH (ref 70–99)

## 2019-03-10 LAB — BASIC METABOLIC PANEL
Anion gap: 8 (ref 5–15)
BUN: 20 mg/dL (ref 8–23)
CO2: 22 mmol/L (ref 22–32)
Calcium: 9.5 mg/dL (ref 8.9–10.3)
Chloride: 108 mmol/L (ref 98–111)
Creatinine, Ser: 1.48 mg/dL — ABNORMAL HIGH (ref 0.61–1.24)
GFR calc Af Amer: 58 mL/min — ABNORMAL LOW (ref >60)
GFR calc non Af Amer: 50 mL/min — ABNORMAL LOW (ref >60)
Glucose, Bld: 250 mg/dL — ABNORMAL HIGH (ref 70–99)
Potassium: 4.9 mmol/L (ref 3.5–5.1)
Sodium: 138 mmol/L (ref 135–145)

## 2019-03-10 MED ORDER — ATORVASTATIN CALCIUM 10 MG PO TABS
20.0000 mg | ORAL_TABLET | Freq: Every day | ORAL | Status: DC
  Administered 2019-03-10 – 2019-03-28 (×19): 20 mg via ORAL
  Filled 2019-03-10 (×19): qty 2

## 2019-03-10 MED ORDER — ASPIRIN 81 MG PO CHEW
81.0000 mg | CHEWABLE_TABLET | Freq: Every day | ORAL | Status: DC
  Administered 2019-03-10 – 2019-03-29 (×20): 81 mg via ORAL
  Filled 2019-03-10 (×20): qty 1

## 2019-03-10 MED ORDER — INSULIN GLARGINE 100 UNIT/ML ~~LOC~~ SOLN
30.0000 [IU] | Freq: Every day | SUBCUTANEOUS | Status: DC
  Administered 2019-03-10: 30 [IU] via SUBCUTANEOUS
  Filled 2019-03-10 (×2): qty 0.3

## 2019-03-10 MED ORDER — METFORMIN HCL ER 500 MG PO TB24
1000.0000 mg | ORAL_TABLET | Freq: Every day | ORAL | Status: DC
  Administered 2019-03-11 – 2019-03-29 (×19): 1000 mg via ORAL
  Filled 2019-03-10 (×19): qty 2

## 2019-03-10 MED ORDER — OLANZAPINE 5 MG PO TABS
10.0000 mg | ORAL_TABLET | Freq: Every day | ORAL | Status: DC
  Administered 2019-03-10 – 2019-03-20 (×11): 10 mg via ORAL
  Filled 2019-03-10 (×11): qty 2

## 2019-03-10 NOTE — TOC Progression Note (Signed)
Transition of Care [facility]) - Progression Note    Patient Details  Name: Dylan Jordan MRN: [id number] Date of Birth: February 21, 1956  Transition of Care [facility]) CM/SW [place], [place] Phone Number: 03/10/2019, 1:25 PM  Clinical Narrative:     [name] called and spoke with the patient's sister, Dylan Jordan. [name] explained that she had not heard back from the SNF's in [place]. [name] informed her that she reached out to [facility] and informed her that they do have long-term care beds available.   [name] confirmed with Clarene Critchley at [facility]. [name] faxed the referral to [facility].   [name] left the 30 day note on the patient's chart to be signed by the MD. [name] will need to fax clinicals to [facility] when they are requested.   [name] will continue to follow.   Expected Discharge Plan: Skilled Nursing Facility Barriers to Discharge: No SNF bed  Expected Discharge Plan and Services Expected Discharge Plan: [place] In-house Referral: Clinical Social Work     Living arrangements for the past 2 months: Apartment Expected Discharge Date: 02/28/19                                     Social Determinants of Health (SDOH) Interventions    Readmission Risk Interventions No flowsheet data found.

## 2019-03-10 NOTE — TOC Progression Note (Signed)
Transition of Care [facility]) - Progression Note    Patient Details  Name: Dylan Jordan MRN: [id number] Date of Birth: Sep 16, 1955  Transition of Care [facility]) CM/SW [place], [place] Phone Number: 03/10/2019, 11:58 AM  Clinical Narrative:     [name] called [place], [name] left a message with the admissions department. [phone]   [name] called [place] and left a message for the admissions director Whitney. [phone]  PASSR had accidentally closed the screen out. [name] had to re-submit a PASSR screening. [name] will submit requested clinicals.  [name] will continue to follow.   Expected Discharge Plan: [place] [facility](HH will not pay for therapies. Patient may be able to get other [facility] services if paid for by Medicaid) Barriers to Discharge: No SNF bed(Talked with patient regarding SNF versus home)  Expected Discharge Plan and Services Expected Discharge Plan: [place] [facility](HH will not pay for therapies. Patient may be able to get other [facility] services if paid for by [facility]) In-house Referral: Clinical Social Work     Living arrangements for the past 2 months: Apartment Expected Discharge Date: 02/28/19                                     Social Determinants of Health (SDOH) Interventions    Readmission Risk Interventions No flowsheet data found.

## 2019-03-10 NOTE — Plan of Care (Signed)
?  Problem: Education: ?Goal: Knowledge of General Education information will improve ?Description: Including pain rating scale, medication(s)/side effects and non-pharmacologic comfort measures ?Outcome: Progressing ?  ?Problem: Health Behavior/Discharge Planning: ?Goal: Ability to manage health-related needs will improve ?Outcome: Progressing ?  ?Problem: Coping: ?Goal: Level of anxiety will decrease ?Outcome: Progressing ?  ?

## 2019-03-10 NOTE — Progress Notes (Addendum)
Family Medicine Teaching Service Daily Progress Note Intern Pager: [phone]  Patient name: Dylan Jordan Medical record number: [id number] Date of birth: 01-May-1956 Age: 63 y.o. Gender: male  Primary Care Provider: Shirley, [place], DO Consultants: Psychiatry Code Status: Full  Pt Overview and Major Events to Date:  10/8-admitted 10/16- patient recommended for SNF by PT, patient preferred to stay hospitalized after discharge order placed with plan to go to SNF  10/20- no available [place] SNF, patient sister asked for preferred facilities in [place], [place] 10/21- re-evaluated by Psychiatry, no inpatient criteria, discontinued Haldol and added Olanzapine 5mg  10/23- [facility] unable to offer bed at this time, SW to continue with attempts for SNF placement  Assessment and Plan: Dylan Jordan a 63 y.o.malepresenting with hallucinations and elevated potassium.PMH is significant for schizophrenia, type 2 diabetes, CKD, hypertension, hyperlipidemia, Hx alcohol and polysubstance abuse.  AMS 2/2 psychiatricmedicationsschizophrenia, improved No acute events overnight.  Patient reports continued but less frequent hallucinations consisting of a puppy and water on the floor, visual hallucinations only.  No as needed medication given overnight.  Patient waiting for placement to SNF, otherwise medically stable.  -Continue Depakote 2.5 g daily -Continue lithium 300 mg twice daily -Continue olanzapine 10 mg daily -Continue Cogentin 1 mg twice daily  Anxiety, stable in the hospital Asymptomatic.  No as needed meds given -Continue to monitor  Type 2 diabeteswith peripheral neuropathy, improving control  CBGs 185-213 last 24 hours.  Required 11 units of short acting insulin. -Continue Metformin 1000 mg daily -increase Lantus from 30 to 35 units daily -CBG checks qHS -discontinue aspart 3 units with meals breakfast and dinner  -Gabapentin 600 mg 3 times daily   CKDstage  III Last Creatinine 1.48, baseline 1.3-1.5. -Encourage adequate p.o. intake  Hypertension Normotensive overnight -Continue amlodipine 10 mg daily   Hyperlipidemia Patient recently restarted on statin for elevated lipid panel.  -continue Atorvastatin 20 mg daily  History of alcohol/polysubstance abuse, stable -Continue to monitor and encourage cessation  GERD, stable -Continue Pepcid 20 mg daily  Gout, stable Asymptomatic -Continue allopurinol 200 mg daily  Constipation Last BM 10/24 -Continue MiraLAX  BPH, stable Patient voiding, no recorded output -Continue finasteride 5 mg daily -Continue Flomax 0.4 mg dailys  FEN/GI: Heart healthy/carb modified PPx: pt encouraged to be up and ambulating regularly   Disposition: SNF when bed available, patient medically stable for discharge   Subjective:  Patient reports having 1-2 hallucinations this morning but has no complaints to pain. Patient's nurses add that he has reported seeing a puppy and water in his room and has been ambulating frequently throughout the day.   Objective: Temp:  [97.8 F (36.6 C)-98 F (36.7 C)] 97.8 F (36.6 C) (10/27 0649) Pulse Rate:  [66-88] 66 (10/27 0649) Resp:  [16-18] 18 (10/27 0649) BP: (118-131)/(70-83) 131/70 (10/27 0649) SpO2:  [99 %-100 %] 99 % (10/27 6301)  Physical Exam: General: male appearing stated age, lying in bed sleeping in NAD, patient easy to wake Cardiovascular: RRR without murmurs, gallops or friction rubs  Respiratory: CTAB without wheezing or diminished breath sounds, stable on RA  Abdomen: full, NT, bowel sounds present  Extremities: trace LE edema  Laboratory:  Recent Labs  Lab 03/10/19 0504  WBC 5.9  HGB 11.3*  HCT 37.1*  PLT 277   Recent Labs  Lab 03/06/19 0448 03/10/19 0504  NA  --  138  K  --  4.9  CL  --  108  CO2  --  22  BUN  --  20  CREATININE 1.77* 1.48*  CALCIUM  --  9.5  GLUCOSE  --  250*   Imaging/Diagnostic Tests: No new  imaging   Nicki [place], MD 03/11/2019, 8:27 AM PGY-1, [facility] Family Medicine [facility] Intern pager: [phone], text pages welcome

## 2019-03-10 NOTE — Progress Notes (Addendum)
Family Medicine Teaching Service Daily Progress Note Intern Pager: [phone]  Patient name: Dylan Jordan Medical record number: [id number] Date of birth: 11/03/1955 Age: 63 y.o. Gender: male  Primary Care Provider: Shirley, [place], DO Consultants: Psychiatry Code Status: Full  Pt Overview and Major Events to Date:  10/8-admitted 10/16- patient recommended for SNF by PT, patient preferred to stay hospitalized after discharge order placed with plan to go to SNF  10/20- no available [place] SNF, patient sister asked for preferred facilities in [place], [place] 10/21- re-evaluated by Psychiatry, no inpatient criteria, discontinued Haldol and added Olanzapine 5mg  10/23- [facility] unable to offer bed at this time, SW to continue with attempts for SNF placement  Assessment and Plan: Dylan Jordan a 63 y.o.malepresenting with hallucinations and elevated potassium.PMH is significant for schizophrenia, type 2 diabetes, CKD, hypertension, hyperlipidemia, Hx alcohol and polysubstance abuse.  AMS 2/2 psychiatricmedicationsschizophrenia, improved No acute events overnight.  Patient reports having some hallucinations in the form of hairball with tails.  No as needed medication given overnight.  Patient waiting for placement to SNF -Continue Depakote 2.5 g daily -Continue lithium 300 mg twice daily -Continue olanzapine 5 mg daily -Continue Cogentin 1 mg twice daily  Anxiety, stable in the hospital Asymptomatic.  No as needed meds given -Continue to monitor  Type 2 diabeteswith peripheral neuropathy, improving control  CBGs 222 last 24 hours.  Required 13 units of insulin coverage.  Patient taking 22 units Lantus daily -Continue Metformin 500 mg daily -Increase Lantus 30 units daily -Sensitive sliding scale insulin coverage -Aspart 3 units with meals -High scale insulin coverage at night -Gabapentin 600 mg 3 times daily   CKDstage III Creatinine 1.48, baseline  1.3-1.5. -Encourage adequate p.o. intake  Hypertension Normotensive overnight -Continue amlodipine 10 mg daily   Hyperlipidemia Patient not currently on any statins -Lipid profile (01/04/2019) triglycerides of 463 -Restart Atorvastatin 20 mg daily  History of alcohol/polysubstance abuse, stable -Continue to monitor and encourage cessation  GERD, stable Unclear as to why patient is taking Pepcid 20 mg and Protonix 40 mg daily -Discontinue Protonix -Continue Pepcid 20 mg daily  Gout, stable Asymptomatic -Continue allopurinol 200 mg daily  Constipation Last BM 10/24 -Continue MiraLAX  BPH, stable Patient voiding, no recorded output -Continue finasteride 5 mg daily -Continue Flomax 0.4 mg dailys  FEN/GI: -Heart healthy/carb modified PPx:  -pt ambulates  Disposition: SNF when bed available  Subjective:  No acute events overnight.  Patient having some visual hallucinations of hair balls with tails.  Denies any chest pain, shortness of breath, abdominal pain.  Objective: Temp:  [97.8 F (36.6 C)-98.3 F (36.8 C)] 98 F (36.7 C) (10/25 2057) Pulse Rate:  [62-79] 66 (10/25 2057) Resp:  [18] 18 (10/25 2057) BP: (111-136)/(73-88) 111/73 (10/25 2057) SpO2:  [98 %-100 %] 100 % (10/25 2057) Physical Exam: General: 63 year old male in no acute distress Cardiovascular: Regular rate and rhythm, no murmurs appreciated Respiratory: Chest clear to auscultation bilaterally, no crackles, no rhonchi Abdomen: Soft, nontender, nondistended, bowel sounds present Extremities: Moves all extremities, no lower extremity edema  Laboratory: No results for input(s): WBC, HGB, HCT, PLT in the last 168 hours. Recent Labs  Lab 03/03/19 0630 03/06/19 0448  NA 139  --   K 4.5  --   CL 108  --   CO2 24  --   BUN 21  --   CREATININE 1.56* 1.77*  CALCIUM 9.5  --   GLUCOSE 172*  --       Imaging/Diagnostic  Tests:   Carollee Leitz, MD 03/10/2019, 5:46 AM PGY-1, [place] Intern pager: [phone], text pages welcome

## 2019-03-10 NOTE — Progress Notes (Signed)
[name] faxed requested clinicals to [facility].   [name] will continue to follow.   Domenic Schwab, MSW, [place] Worker [facility]  [phone]

## 2019-03-11 LAB — GLUCOSE, CAPILLARY
Glucose-Capillary: 142 mg/dL — ABNORMAL HIGH (ref 70–99)
Glucose-Capillary: 232 mg/dL — ABNORMAL HIGH (ref 70–99)
Glucose-Capillary: 186 mg/dL — ABNORMAL HIGH (ref 70–99)
Glucose-Capillary: 231 mg/dL — ABNORMAL HIGH (ref 70–99)

## 2019-03-11 MED ORDER — INSULIN GLARGINE 100 UNIT/ML ~~LOC~~ SOLN
35.0000 [IU] | Freq: Every day | SUBCUTANEOUS | Status: DC
  Administered 2019-03-11 – 2019-03-29 (×19): 35 [IU] via SUBCUTANEOUS
  Filled 2019-03-11 (×21): qty 0.35

## 2019-03-11 NOTE — Plan of Care (Signed)
?  Problem: Education: ?Goal: Knowledge of General Education information will improve ?Description: Including pain rating scale, medication(s)/side effects and non-pharmacologic comfort measures ?Outcome: Progressing ?  ?Problem: Health Behavior/Discharge Planning: ?Goal: Ability to manage health-related needs will improve ?Outcome: Progressing ?  ?Problem: Coping: ?Goal: Level of anxiety will decrease ?Outcome: Progressing ?  ?

## 2019-03-11 NOTE — Progress Notes (Signed)
Physical Therapy Treatment Patient Details Name: Dylan Jordan MRN: [id number] DOB: [birth year] Today's Date: 03/11/2019    History of Present Illness Pt is a 63 y/o male admitted secondary to hallucinations and AMS likely secondary to medications. Pt also with hyperkalemia. PMH includes schizophrenia and DM.     PT Comments    Patient seen for mobility progression. Pt is agreeable to participate in PT tx. Pt continues to make progress toward PT goals and overall supervision for gait training this session without use of AD. Pt is tangential and reports visual hallucinations throughout session. PT will continue to follow acutely and progress as tolerated.    Follow Up Recommendations  Other (comment)(inpt psych care)     Equipment Recommendations  Rolling walker with 5" wheels    Recommendations for Other Services       Precautions / Restrictions Precautions Precautions: Fall Restrictions Weight Bearing Restrictions: No    Mobility  Bed Mobility Overal bed mobility: Independent                Transfers Overall transfer level: Independent                  Ambulation/Gait Ambulation/Gait assistance: Supervision Gait Distance (Feet): 400 Feet Assistive device: None Gait Pattern/deviations: Step-through pattern;Decreased stride length Gait velocity: Decreased   General Gait Details: pt with decreased bilat step lengths but able to correct with cues; pt has guarded movements and with mild instability noted however no LOB or physical assist needed; supervision required to navigate hallway safely at times   Stairs             Wheelchair Mobility    Modified Rankin (Stroke Patients Only)       Balance Overall balance assessment: Needs assistance Sitting-balance support: Feet supported Sitting balance-Leahy Scale: Good     Standing balance support: No upper extremity supported;During functional activity Standing balance-Leahy Scale: Fair                               Cognition Arousal/Alertness: Awake/alert Behavior During Therapy: WFL for tasks assessed/performed Overall Cognitive Status: No family/caregiver present to determine baseline cognitive functioning(psych history)                                 General Comments: pt tangential throughout session and with visual hallucinations that he states he "dismisses" because he "knows they're not there"      Exercises      General Comments        Pertinent Vitals/Pain Pain Assessment: No/denies pain    Home Living                      Prior Function            PT Goals (current goals can now be found in the care plan section) Progress towards PT goals: Progressing toward goals    Frequency    Min 3X/week      PT Plan Current plan remains appropriate    Co-evaluation              AM-PAC PT "6 Clicks" Mobility   Outcome Measure  Help needed turning from your back to your side while in a flat bed without using bedrails?: None Help needed moving from lying on your back to sitting on the side of a flat bed without  using bedrails?: None Help needed moving to and from a bed to a chair (including a wheelchair)?: None Help needed standing up from a chair using your arms (e.g., wheelchair or bedside chair)?: A Little Help needed to walk in hospital room?: A Little Help needed climbing 3-5 steps with a railing? : A Little 6 Click Score: 21    End of Session Equipment Utilized During Treatment: Gait belt Activity Tolerance: Patient tolerated treatment well Patient left: with call bell/phone within reach;in bed;Other (comment)(pt sitting EOB; no bed alarm on upon arrival ) Nurse Communication: Mobility status PT Visit Diagnosis: Unsteadiness on feet (R26.81);Muscle weakness (generalized) (M62.81)     Time: 3893-7342 PT Time Calculation (min) (ACUTE ONLY): 16 min  Charges:  $Gait Training: 8-22 mins                      Earney Navy, PTA Acute Rehabilitation Services Pager: [phone] Office: [phone]     Darliss Cheney 03/11/2019, 2:37 PM

## 2019-03-12 LAB — GLUCOSE, CAPILLARY
Glucose-Capillary: 126 mg/dL — ABNORMAL HIGH (ref 70–99)
Glucose-Capillary: 181 mg/dL — ABNORMAL HIGH (ref 70–99)
Glucose-Capillary: 154 mg/dL — ABNORMAL HIGH (ref 70–99)
Glucose-Capillary: 239 mg/dL — ABNORMAL HIGH (ref 70–99)

## 2019-03-12 NOTE — Progress Notes (Addendum)
Family Medicine Teaching Service Daily Progress Note Intern Pager: [phone]  Patient name: Dylan Jordan Medical record number: [id number] Date of birth: [birth year] Age: 63 y.o. Gender: male  Primary Care Provider: Shirley, [place], DO Consultants: Psychiatry Code Status: Full  Pt Overview and Major Events to Date:  10/8-admitted for hyperkalemia, with psychosis 10/16- patient recommended for SNF by PT, patient preferred to stay hospitalized after discharge order placed with plan to go to SNF  10/20- no available [place] SNF, patient sister asked for preferred facilities in [place], [place] 10/21- re-evaluated by Psychiatry, no inpatient criteria, discontinued Haldol and added Olanzapine 5mg  10/23- [facility] unable to offer bed at this time, SW to continue with attempts for SNF placement 10/26- Sister notified of beds available in [facility] 10/30- Cogentin increased to 2 mg daily  10/30- Patient no longer qualifies for SNF, patient's  Sister notifed,  [name] began ALF process for placement  Assessment and Plan: Dylan Jordan a 63 y.o.malepresenting with hallucinations and elevated potassium.PMH is significant for schizophrenia, type 2 diabetes, CKD, hypertension, hyperlipidemia, Hx alcohol and polysubstance abuse.  AMS 2/2 psychiatricmedicationsschizophrenia, improved No acute events overnight.  Patient reports continued visual hallucinations that are improving. He reports being pleased with the effects of Zyprexa on his hallucinations but reports that it makes him more drowsy.  Patient waiting for placement ALF, has been determined to no longer qualify for SNF. Will follow up with social work regarding updates for potential placement. Patient is medically stable for discharge. Updated EKG with QTc of 445. No tremor appreciated on physical exam.  -Continue Depakote 2.5 g daily -Continue lithium 300 mg twice daily -Continue olanzapine 10 mg daily -Continue Cogentin 2  mg twice daily  Anxiety, stable in the hospital Asymptomatic. No as needed meds given -Continue to monitor  Type 2 diabeteswith peripheral neuropathy, improving control  CBGs 226-273 in last 24 hours.  Patient currently on 35 units daily.  - Metformin 1000 mg daily - Lantus from 35 units daily -CBG checks daily  -Gabapentin 600 mg 3 times daily   CKDstage III Last Creatinine 1.48, baseline 1.3-1.5. -Encourage adequate p.o. intake  Hypertension Normotensive overnight.  -Continue amlodipine 10 mg daily   Hyperlipidemia Patient recently restarted on statin for elevated lipid panel.  -continue Atorvastatin 20 mg daily  History of alcohol/polysubstance abuse, stable -Continue to monitor and encourage cessation  GERD, stable -Continue Pepcid 20 mg daily  Gout, stable Asymptomatic -Continue allopurinol 200 mg daily  Constipation Last BM 10/24, continue to encourage Miralax.  -Continue MiraLAX  BPH, stable Patient voiding, no recorded output -Continue finasteride 5 mg daily -Continue Flomax 0.4 mg dailys  FEN/GI: Heart healthy/carb modified  PPx: pt encouraged to be up and ambulating regularly   Disposition: patient awaiting placement in ALF, medically stable for discharge  Subjective:  Patient reports that he continues to have visual hallucinations that are improving on the Zyprexa. Patient mentions 1 hallucination of an animal, and that the frequency of the hallucinations is decreasing. Patient does report that the zyprexa makes him drowsy.    Objective: Temp:  [97.5 F (36.4 C)-98 F (36.7 C)] 97.5 F (36.4 C) (10/31 0700) Pulse Rate:  [69-99] 99 (10/31 0700) Resp:  [18-20] 18 (10/31 0700) BP: (114-139)/(77-89) 114/78 (10/31 0700) SpO2:  [98 %-100 %] 98 % (10/31 0700)  Physical Exam: General: male appearing stated age in NAD sitting on the edge of bed eating breakfast  Cardiovascular: RRR without murmurs or gallops  Respiratory: CTAB without  increased  WOB or crackles nor wheezing  Abdomen:  Fullness, bowel sounds present throughout  Extremities: trace bilateral lower extremity edema    Laboratory:  Recent Labs  Lab 03/10/19 0504  WBC 5.9  HGB 11.3*  HCT 37.1*  PLT 277   Recent Labs  Lab 03/10/19 0504  NA 138  K 4.9  CL 108  CO2 22  BUN 20  CREATININE 1.48*  CALCIUM 9.5  GLUCOSE 250*   Imaging/Diagnostic Tests: No new imaging   Dylan [place], MD 03/15/2019, 9:21 AM PGY-1, [place] [facility] FPTS Intern pager: [phone], text pages welcome

## 2019-03-13 LAB — GLUCOSE, CAPILLARY
Glucose-Capillary: 151 mg/dL — ABNORMAL HIGH (ref 70–99)
Glucose-Capillary: 273 mg/dL — ABNORMAL HIGH (ref 70–99)

## 2019-03-13 NOTE — Progress Notes (Signed)
Family Medicine Teaching Service Daily Progress Note Intern Pager: [phone]  Patient name: Dylan Jordan Medical record number: [id number] Date of birth: February 18, 1956 Age: 63 y.o. Gender: male  Primary Care Provider: Shirley, [place], DO Consultants: Psychiatry Code Status: Full  Pt Overview and Major Events to Date:  10/8-admitted 10/16- patient recommended for SNF by PT, patient preferred to stay hospitalized after discharge order placed with plan to go to SNF  10/20- no available [place] SNF, patient sister asked for preferred facilities in [place], [place] 10/21- re-evaluated by Psychiatry, no inpatient criteria, discontinued Haldol and added Olanzapine 5mg  10/23- [facility] unable to offer bed at this time, SW to continue with attempts for SNF placement  Assessment and Plan: Dylan Jordan a 63 y.o.malepresenting with hallucinations and elevated potassium.PMH is significant for schizophrenia, type 2 diabetes, CKD, hypertension, hyperlipidemia, Hx alcohol and polysubstance abuse.  AMS 2/2 psychiatricmedicationsschizophrenia, improved No acute events overnight.  Patient continues to report having hallucinations in form of hairball details that periodically land on him.  Describes them as disturbing and he swipes him away.  No as needed meds given overnight.  Patient waiting placement to SNF -Continue Depakote -Continue olanzapine -Continue Cogentin -Haldol injection 400 mg due 11/2.  Anxiety, stable in the hospital Asymptomatic.  No as needed meds given overnight. -Continue to monitor  Type 2 diabeteswith peripheral neuropathy, improving control  CBGs overnight 239. -Continue Metformin 1000 mg daily -Continue Lantus 35 units daily  CKDstage III -Baseline 1.3-1.5  Hypertension Normotensive overnight -Continue amlodipine  Hyperlipidemia -Continue atorvastatin  History of alcohol/polysubstance abuse, stable -Continue to monitor  GERD,  stable Asymptomatic -Continue Pepcid  Gout, stable Asymptomatic -Continue allopurinol  Constipation -Continue MiraLAX urine  BPH, stable -Continue finasteride and Flomax  FEN/GI: -Heart healthy/carb modified PPx:  -pt ambulates  Disposition: SNF when bed available  Subjective:  No acute events overnight.  Patient still having hallucinations of her bowels with details that landed on him.  Eating well.  Voiding well.  Waiting for SNF placement.  Objective: Temp:  [97.4 F (36.3 C)-98 F (36.7 C)] 97.4 F (36.3 C) (10/29 0800) Pulse Rate:  [68-91] 68 (10/29 0800) Resp:  [16-19] 16 (10/29 0800) BP: (131-144)/(74-94) 144/81 (10/29 0800) SpO2:  [98 %-100 %] 100 % (10/29 0800) Physical Exam: General: 63 year old male in no acute distress Cardiovascular: Regular rate and rhythm, no murmurs appreciated  Respiratory: Chest clear to auscultation bilaterally, no crackles, no rhonchi  Abdomen: Soft, nontender, nondistended, bowel sounds present, no organomegaly. Extremities: Moving all extremities, no lower extremity edema.  Laboratory: Recent Labs  Lab 03/10/19 0504  WBC 5.9  HGB 11.3*  HCT 37.1*  PLT 277   Recent Labs  Lab 03/10/19 0504  NA 138  K 4.9  CL 108  CO2 22  BUN 20  CREATININE 1.48*  CALCIUM 9.5  GLUCOSE 250*      Imaging/Diagnostic Tests:   Carollee Leitz, MD 03/13/2019, 1:24 PM PGY-1, [place] Intern pager: [phone], text pages welcome

## 2019-03-13 NOTE — Plan of Care (Signed)
  Problem: Education: Goal: Knowledge of General Education information will improve Description: Including pain rating scale, medication(s)/side effects and non-pharmacologic comfort measures Outcome: Progressing   Problem: Coping: Goal: Level of anxiety will decrease Outcome: Progressing   

## 2019-03-13 NOTE — TOC Progression Note (Addendum)
Transition of Care [facility]) - Progression Note    Patient Details  Name: Dylan Jordan MRN: [id number] Date of Birth: 1956/01/11  Transition of Care [facility]) CM/SW [place], [place] Phone Number: 03/13/2019, 1:00 PM  Clinical Narrative:     [name] spoke with Clarene Critchley about the referral at [facility]. They are awaiting to hear back from their directors.   [name] called and spoke with [facility]. They need the referral resubmitted. [phone] is the telephone and [phone] is the fax number.   [name] called and left a message with the Network engineer at [facility]. The admissions director will call the [name] back. [phone] is the telephone number.   The patient has been cleared by psych and is walking 400 feet. The patient has progressed beyond skilled nursing and is in need of assisted living. [name] called TOC supervisor to discuss disposition plan. [name] called patient sister and left her a voicemail.   [name] will continue to follow.   Expected Discharge Plan: Skilled Nursing Facility Barriers to Discharge: No SNF bed  Expected Discharge Plan and Services Expected Discharge Plan: [place] In-house Referral: Clinical Social Work     Living arrangements for the past 2 months: Apartment Expected Discharge Date: 02/28/19                                     Social Determinants of Health (SDOH) Interventions    Readmission Risk Interventions No flowsheet data found.

## 2019-03-14 LAB — GLUCOSE, CAPILLARY: Glucose-Capillary: 226 mg/dL — ABNORMAL HIGH (ref 70–99)

## 2019-03-14 MED ORDER — BENZTROPINE MESYLATE 2 MG PO TABS
2.0000 mg | ORAL_TABLET | Freq: Two times a day (BID) | ORAL | Status: DC
  Administered 2019-03-14 – 2019-03-18 (×8): 2 mg via ORAL
  Filled 2019-03-14 (×8): qty 1

## 2019-03-14 NOTE — Plan of Care (Signed)
  Problem: Education: Goal: Knowledge of General Education information will improve Description Including pain rating scale, medication(s)/side effects and non-pharmacologic comfort measures Outcome: Progressing   

## 2019-03-14 NOTE — TOC Progression Note (Signed)
Transition of Care [facility]) - Progression Note    Patient Details  Name: Dylan Jordan MRN: [id number] Date of Birth: January 05, 1956  Transition of Care [facility]) CM/SW [place], [place] Phone Number: 03/14/2019, 3:20 PM  Clinical Narrative:     Patient no longer appropriate for SNF. [name] called and spoke with the patient's sister, [name]. She is in agreement with him needing a next level of care. [name] explained that he is medically ready to leave the hospital. She is not able to move him down to [place] due to she is taking care of their mother. [name] inquired about his niece. She stated that she has other health conditions that would prevent from supervising him the way he needed to be. [name] stated she would start the ALF process.   [name] called [facility]. [name] faxed the referral to both facilities. [name] is awaiting response from the facilities.   [name] will continue to follow and assist with discharge planning.   Expected Discharge Plan: Assisted Living Barriers to Discharge: Unsafe home situation, Other (comment)(Awaiting ALF placement)  Expected Discharge Plan and Services Expected Discharge Plan: Assisted Living In-house Referral: Clinical Social Work Discharge Planning Services: NA Post Acute Care Choice: Nursing Home Living arrangements for the past 2 months: Apartment Expected Discharge Date: 02/28/19               DME Arranged: N/A DME Agency: NA       HH Arranged: NA           Social Determinants of Health (SDOH) Interventions    Readmission Risk Interventions No flowsheet data found.

## 2019-03-14 NOTE — Progress Notes (Signed)
Family Medicine Teaching Service Daily Progress Note Intern Pager: [phone]  Patient name: Dylan Jordan Medical record number: [id number] Date of birth: 02/12/1956 Age: 63 y.o. Gender: male  Primary Care Provider: Shirley, [place], DO Consultants: Psychiatry Code Status: Full  Pt Overview and Major Events to Date:  10/8-admitted 10/16- patient recommended for SNF by PT, patient preferred to stay hospitalized after discharge order placed with plan to go to SNF  10/20- no available [place] SNF, patient sister asked for preferred facilities in [place], [place] 10/21- re-evaluated by Psychiatry, no inpatient criteria, discontinued Haldol and added Olanzapine 5mg  10/23- [facility] unable to offer bed at this time, SW to continue with attempts for SNF placement  Assessment and Plan: Bion Todorov a 63 y.o.malepresenting with hallucinations and elevated potassium.PMH is significant for schizophrenia, type 2 diabetes, CKD, hypertension, hyperlipidemia, Hx alcohol and polysubstance abuse.  Schizophrenia Acute events overnight.  Continues to have hallucinations.  On exam patient noted to have bilateral upper arm shaking. Concern for tardive dyskinesia.  Patient currently waiting for SNF -Daily Depakote -Continue olanzapine -Crease Cogentin 2 mg twice daily -Haldol injection 4 mg due 11/2  Anxiety, stable in the hospital Asymptomatic.  No as needed meds given overnight. -Continue to monitor  Type 2 diabeteswith peripheral neuropathy, improving control  CBGs overnight 239. -Continue Metformin 1000 mg daily -Continue Lantus 35 units daily  CKDstage III -Baseline 1.3-1.5  Hypertension Normotensive overnight -Continue amlodipine  Hyperlipidemia -Continue atorvastatin  History of alcohol/polysubstance abuse, stable -Continue to monitor  GERD, stable Asymptomatic -Continue Pepcid  Gout, stable Asymptomatic -Continue allopurinol  Constipation -Continue  MiraLAX urine  BPH, stable -Continue finasteride and Flomax  FEN/GI: -Heart healthy/carb modified PPx:  -pt ambulates  Disposition: SNF when bed available  Subjective:  No acute events overnight.  Denies any headaches, chest pain or shortness of breath.  Appetite good.  Patient awaiting SNF placement.  Objective: Temp:  [97.8 F (36.6 C)] 97.8 F (36.6 C) (10/30 0519) Pulse Rate:  [62-97] 62 (10/30 0519) Resp:  [18] 18 (10/30 0519) BP: (135-144)/(70-95) 135/70 (10/30 0519) SpO2:  [96 %-100 %] 100 % (10/30 0519) Physical Exam: General: 63 year old gentleman in no acute distress Cardiovascular: Regular rate and rhythm, no murmurs appreciated Respiratory: Clear to auscultation bilaterally, no crackles, rhonchi, no increased work of breathing Abdomen: Soft, nontender, nondistended, bowel sounds present, no organomegaly Extremities: Moving all extremities.  Slight tremor noted to bilateral upper extremities. Neuro: Alert and orientated x4.  Engages in conversation.  Slow to respond.  Laboratory: Recent Labs  Lab 03/10/19 0504  WBC 5.9  HGB 11.3*  HCT 37.1*  PLT 277   Recent Labs  Lab 03/10/19 0504  NA 138  K 4.9  CL 108  CO2 22  BUN 20  CREATININE 1.48*  CALCIUM 9.5  GLUCOSE 250*      Imaging/Diagnostic Tests:   Carollee Leitz, MD 03/14/2019, 4:29 PM PGY-1, [place] Intern pager: [phone], text pages welcome

## 2019-03-14 NOTE — NC FL2 (Addendum)
[employer] MEDICAID FL2 LEVEL OF CARE SCREENING TOOL     IDENTIFICATION  Patient Name: Dylan Jordan Birthdate: [birth year] Sex: male Admission Date (Current Location): 02/20/2019  [place] Number:  Haynes Bast [phone] N Facility and Address:  [facility], [address], [place], [place] [address]      Provider Number: [id number]  Attending Physician Name and Address:  McDiarmid, Leighton Roach, MD  Relative Name and Phone Number:  Blair Heys, [phone]    Current Level of Care: Hospital Recommended Level of Care: Assisted Living Facility Prior Approval Number:    Date Approved/Denied:   PASRR Number: [id number]  Discharge Plan: Domiciliary (Rest home)    Current Diagnoses: Patient Active Problem List   Diagnosis Date Noted  . Schizoaffective disorder, bipolar type (HCC) 02/21/2019  . Hallucinations   . Anxiety 09/18/2018  . Swelling of both lower extremities 09/13/2018  . Type 2 diabetes mellitus without complication, with long-term current use of insulin (HCC)   . Anemia   . Hyperlipidemia 12/18/2017  . 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 Intermittent chest pain 11/25/2017  . Allergic rhinitis with a nonallergic component 07/30/2017  . Food allergy 07/30/2017  . Chronic gout involving toe of left foot without tophus 05/18/2017  . Hemorrhoids 05/04/2017  . Haloperidol adverse reaction 04/16/2017  . Chronic low back pain 01/03/2017  . Chronic cough 06/30/2016  . Drug-induced mood disorder (HCC) 06/14/2016  . Type 2 diabetes mellitus with diabetic neuropathy (HCC) 05/26/2016  . Iron deficiency anemia 05/26/2016  . Chronic kidney disease 05/26/2016  . HTN (hypertension) 05/26/2016  . Tobacco abuse 05/26/2016  . History of substance abuse (HCC) 05/26/2016  . History of alcohol abuse 05/26/2016  . Mild persistent asthma 05/26/2016    Orientation RESPIRATION BLADDER Height & Weight     Self, Situation,  Place, Time  Normal Continent Weight: 244 lb 11.4 oz (111 kg) Height:  6\' 1"  (185.4 cm)  BEHAVIORAL SYMPTOMS/MOOD NEUROLOGICAL BOWEL NUTRITION STATUS      Continent Diet(heart healthy/carb modified diet, thin liquids)  AMBULATORY STATUS COMMUNICATION OF NEEDS Skin   Supervision Verbally Normal                       Personal Care Assistance Level of Assistance  Bathing, Feeding, Dressing, Total care Bathing Assistance: Independent Feeding assistance: Independent Dressing Assistance: Independent Total Care Assistance: Independent   Functional Limitations Info  Sight, Hearing, Speech Sight Info: Impaired(Impaired vision) Hearing Info: Adequate Speech Info: Adequate(slurred speech)    SPECIAL CARE FACTORS FREQUENCY  PT (By licensed PT)     N/A              Contractures Contractures Info: Not present    Additional Factors Info  Code Status, Allergies, Psychotropic, Insulin Sliding Scale Code Status Info: Full Code Allergies Info: : Chantix (Varenicline Tartrate), Ativan (Lorazepam), Shellfish-derived Products, Penicillins Psychotropic Info: cogentin 2mg  2x daily; depakote 2,000mg  every evening; depakote 500mg  every morning; zyprexa 10mg  daily at bedtime Insulin Sliding Scale Info: insulin glargine (lantus) 35 units daily       Current Medications (03/14/2019):  This is the current hospital active medication list Current Facility-Administered Medications  Medication Dose Route Frequency Provider Last Rate Last Dose  . acetaminophen (TYLENOL) tablet 650 mg  650 mg Oral Q6H PRN , DO   650 mg at 03/13/19 1717   Or  . acetaminophen (TYLENOL) suppository 650 mg  650 mg Rectal Q6H PRN Rumball,  Bryson Ha, DO      . albuterol (PROVENTIL) (2.5 MG/3ML) 0.083% nebulizer solution 3 mL  3 mL Inhalation Q6H PRN Rory Percy, DO      . allopurinol (ZYLOPRIM) tablet 200 mg  200 mg Oral Daily Rory Percy, DO   200 mg at 03/14/19 0841  . amLODipine (NORVASC) tablet  10 mg  10 mg Oral Daily Rory Percy, DO   10 mg at 03/14/19 0841  . aspirin chewable tablet 81 mg  81 mg Oral Daily Matilde Haymaker, MD   81 mg at 03/14/19 7209  . atorvastatin (LIPITOR) tablet 20 mg  20 mg Oral q1800 Carollee Leitz, MD   20 mg at 03/13/19 1717  . benztropine (COGENTIN) tablet 2 mg  2 mg Oral BID Rory Percy, DO      . divalproex (DEPAKOTE) DR tablet 2,000 mg  2,000 mg Oral QPM Rory Percy, DO   2,000 mg at 03/13/19 1717  . divalproex (DEPAKOTE) DR tablet 500 mg  500 mg Oral q morning - 10a Rory Percy, DO   500 mg at 03/14/19 4709  . famotidine (PEPCID) tablet 20 mg  20 mg Oral BID Rory Percy, DO   20 mg at 03/14/19 6283  . finasteride (PROSCAR) tablet 5 mg  5 mg Oral Daily Rory Percy, DO   5 mg at 03/14/19 6629  . gabapentin (NEURONTIN) capsule 600 mg  600 mg Oral TID Rory Percy, DO   600 mg at 03/14/19 4765  . hydrOXYzine (ATARAX/VISTARIL) tablet 25 mg  25 mg Oral Q6H PRN Patrecia Pour, NP   25 mg at 03/07/19 1007  . insulin glargine (LANTUS) injection 35 Units  35 Units Subcutaneous Daily Mullis, Kiersten P, DO   35 Units at 03/14/19 1125  . lithium carbonate capsule 300 mg  300 mg Oral BID WC Rory Percy, DO   300 mg at 03/14/19 0841  . metFORMIN (GLUCOPHAGE-XR) 24 hr tablet 1,000 mg  1,000 mg Oral Q breakfast Rory Percy, DO   1,000 mg at 03/14/19 0840  . OLANZapine (ZYPREXA) tablet 10 mg  10 mg Oral QHS Carollee Leitz, MD   10 mg at 03/13/19 2210  . polyethylene glycol (MIRALAX / GLYCOLAX) packet 17 g  17 g Oral Daily PRN Rory Percy, DO      . tamsulosin (FLOMAX) capsule 0.4 mg  0.4 mg Oral QPC breakfast Rory Percy, DO   0.4 mg at 03/14/19 4650     Discharge Medications: Please see discharge summary for a list of discharge medications.  Relevant Imaging Results:  Relevant Lab Results:   Additional Information SSN: [id number]; COVID negative on 02/20/2019  Suellyn Meenan B Melodi Happel, LCSWA

## 2019-03-14 NOTE — Progress Notes (Signed)
RNCM met with the patient at bedside and the patient stated he only had $750.00 in income a month. He receives SSI/SSD.   [name] plans on faxing him out to ALF's in the area.   [name] will continue to follow.   Domenic Schwab, MSW, [place] Worker [facility]  [phone]

## 2019-03-15 LAB — GLUCOSE, CAPILLARY: Glucose-Capillary: 227 mg/dL — ABNORMAL HIGH (ref 70–99)

## 2019-03-15 MED ORDER — SENNA 8.6 MG PO TABS
1.0000 | ORAL_TABLET | Freq: Every day | ORAL | Status: DC
  Administered 2019-03-15 – 2019-03-29 (×15): 8.6 mg via ORAL
  Filled 2019-03-15 (×15): qty 1

## 2019-03-15 NOTE — TOC Progression Note (Signed)
Transition of Care [facility]) - Progression Note    Patient Details  Name: Crews Mccollam MRN: [id number] Date of Birth: 12/09/1955  Transition of Care [facility]) CM/SW [place], [place] Phone Number: 03/15/2019, 1:50 PM  Clinical Narrative:     [name] received a phone call from [facility], admissions director at [facility]. She stated that she was looking at the referral and could possibly offer a bed. She would need to speak with her nursing director on Monday. Ms. Kenton Kingfisher stated she would follow up with [name] on Monday.   [name] will continue to follow and assist with discharge planning.   Expected Discharge Plan: Assisted Living Barriers to Discharge: Unsafe home situation, Other (comment)(Awaiting ALF placement)  Expected Discharge Plan and Services Expected Discharge Plan: Assisted Living In-house Referral: Clinical Social Work Discharge Planning Services: NA Post Acute Care Choice: Nursing Home Living arrangements for the past 2 months: Apartment Expected Discharge Date: 02/28/19               DME Arranged: N/A DME Agency: NA       HH Arranged: NA           Social Determinants of Health (SDOH) Interventions    Readmission Risk Interventions No flowsheet data found.

## 2019-03-16 LAB — GLUCOSE, CAPILLARY: Glucose-Capillary: 177 mg/dL — ABNORMAL HIGH (ref 70–99)

## 2019-03-16 NOTE — Progress Notes (Signed)
Family Medicine Teaching Service Daily Progress Note Intern Pager: [phone]  Patient name: Dylan Jordan Medical record number: [id number] Date of birth: 22-May-1955 Age: 63 y.o. Gender: male  Primary Care Provider: Shirley, [place], DO Consultants: Psychiatry Code Status: Full  Pt Overview and Major Events to Date:  10/8-admitted for hyperkalemia, with psychosis 10/16- patient recommended for SNF by PT, patient preferred to stay hospitalized after discharge order placed with plan to go to SNF  10/20- no available [place], patient sister asked for preferred facilities in [place], [place] 10/21- re-evaluated by Psychiatry, no inpatient criteria, discontinued Haldol and added Olanzapine 5mg  10/23- [facility] unable to offer bed at this time, SW to continue with attempts for SNF placement 10/26- Sister notified of beds available in [facility] 10/30- Cogentin increased to 2 mg daily  10/30- Patient no longer qualifies for SNF, patient's  Sister notifed,  [name] began ALF process for placement  Assessment and Plan: Dylan Jordan a 63 y.o.malepresenting with hallucinations and elevated potassium.PMH is significant for schizophrenia, type 2 diabetes, CKD, hypertension, hyperlipidemia, Hx alcohol and polysubstance abuse.  AMS 2/2 psychiatricmedicationsschizophrenia, improved No acute events overnight. Patient reports continued visual hallucinations, "only when standing". Overall, seems to be improving. Does not endorse them as bothering him. Patient waiting for placement ALF -Continue Depakote 2.5 g daily -Continue lithium 300 mg twice daily -Continue olanzapine 10 mg daily -Continue Cogentin 2 mg twice daily  Anxiety, stable in the hospital Asymptomatic. No as needed meds given -Continue to monitor  Type 2 diabeteswith peripheral neuropathy, improving control  CBGs withing 200s past 24hs.  Patient currently on 35 units daily.  - Metformin 1000 mg daily - Lantus  from 35 units daily -CBG checks daily  -Gabapentin 600 mg 3 times daily   CKDstage III Last Creatinine 1.48, baseline 1.3-1.5. -Encourage adequate p.o. intake  Hypertension Normotensive overnight.  -Continue amlodipine 10 mg daily   Hyperlipidemia Patient recently restarted on statin for elevated lipid panel.  -continue Atorvastatin 20 mg daily  History of alcohol/polysubstance abuse, stable -Continue to monitor and encourage cessation  GERD, stable -Continue Pepcid 20 mg daily  Gout, stable Asymptomatic -Continue allopurinol 200 mg daily  Constipation Last BM 10/24, continue to encourage Miralax.  -Continue MiraLAX  BPH, stable Patient voiding, no recorded output -Continue finasteride 5 mg daily -Continue Flomax 0.4 mg dailys  FEN/GI: Heart healthy/carb modified  PPx: pt encouraged to be up and ambulating regularly   Disposition: patient awaiting placement in ALF, medically stable for discharge  Subjective:  Patient sleepy this AM, but rousable. Mumbles to answer questions. Patient endorses continued visual hallucinations only when he is "standing." Denies pain or other complaints.   Objective: Temp:  [97.5 F (36.4 C)-98.3 F (36.8 C)] 97.6 F (36.4 C) (11/01 0603) Pulse Rate:  [62-99] 62 (11/01 0603) Resp:  [18] 18 (11/01 0603) BP: (110-147)/(78-90) 110/79 (11/01 0603) SpO2:  [94 %-100 %] 97 % (11/01 0603)  Physical Exam: General: male appearing stated age in NAD, lying comfortably in bed Cardiovascular: RRR without murmurs or gallops  Respiratory: CTAB without increased WOB or crackles nor wheezing  Abdomen:  Fullness, bowel sounds present throughout  Extremities: moves extremities spontaneously  Laboratory:  Recent Labs  Lab 03/10/19 0504  WBC 5.9  HGB 11.3*  HCT 37.1*  PLT 277   Recent Labs  Lab 03/10/19 0504  NA 138  K 4.9  CL 108  CO2 22  BUN 20  CREATININE 1.48*  CALCIUM 9.5  GLUCOSE 250*  Imaging/Diagnostic  Tests: No new imaging   Dylan Gunner, MD 03/16/2019, 6:12 AM PGY-3, [facility] Family Medicine FPTS Intern pager: [phone], text pages welcome

## 2019-03-17 LAB — SARS CORONAVIRUS 2 (TAT 6-24 HRS): SARS Coronavirus 2: NEGATIVE

## 2019-03-17 LAB — GLUCOSE, CAPILLARY: Glucose-Capillary: 231 mg/dL — ABNORMAL HIGH (ref 70–99)

## 2019-03-17 MED ORDER — HALOPERIDOL DECANOATE 100 MG/ML IM SOLN
400.0000 mg | Freq: Once | INTRAMUSCULAR | Status: AC
  Administered 2019-03-17: 400 mg via INTRAMUSCULAR
  Filled 2019-03-17: qty 4

## 2019-03-17 NOTE — Plan of Care (Signed)
  Problem: Health Behavior/Discharge Planning: Goal: Ability to manage health-related needs will improve Outcome: Progressing   

## 2019-03-17 NOTE — Progress Notes (Signed)
Family Medicine Teaching Service Daily Progress Note Intern Pager: [phone]  Patient name: Dylan Jordan Medical record number: [id number] Date of birth: [birth year] Age: 63 y.o. Gender: male  Primary Care Provider: Shirley, [place], DO Consultants: Psychiatry Code Status: Full code  Pt Overview and Major Events to Date:  10/8-admitted for hyperkalemia, with psychosis 10/16- patient recommended for SNF by PT, patient preferred to stay hospitalized after discharge order placed with plan to go to SNF  10/20- no available [place] SNF, patient sister asked for preferred facilities in [place], [place] 10/21- re-evaluated by Psychiatry, no inpatient criteria, discontinued Haldol and added Olanzapine 5mg  10/23- [facility] unable to offer bed at this time, SW to continue with attempts for SNF placement 10/26- Sister notified of beds available in [facility] 10/30- Cogentin increased to 2 mg daily  10/30- Patient no longer qualifies for SNF, patient's  Sister notifed,  [name] began ALF process for placement  Assessment and Plan: [name] a 63 y.o.malepresenting with hallucinations and elevated potassium.PMH is significant for schizophrenia, type 2 diabetes, CKD, hypertension, hyperlipidemia, Hx alcoholandpolysubstance abuse.  AMS 2/2 psychiatricmedicationsschizophrenia, improved Patient reports continued visual hallucinations of bugs and birds flying around.  Is overall improving.  Is able to differentiate what is a hallucination and was real.  Patient continues to await placement for ALF.   -Continue Depakote 2.5 g daily -Continue lithium 300 mg twice daily -Continue olanzapine 10 mg daily -Continue Cogentin 2 mg twice daily -Patient received Haldol injection today 11/2 -Covid test ordered and collected 11/2 in preparation for discharge to ALF  Anxiety, stable in the hospital Asymptomatic. No as needed meds given -Continue to monitor  Type 2 diabeteswith  peripheral neuropathy, improving control  CBGs within 170-200s in past 24hs.  Patient currently on 35 units Lantus daily.  - Metformin 1000 mg daily - Lantus 35 units daily -CBG checks daily  -Gabapentin 600 mg 3 times daily   CKDstage III, stable Most recent creatinine improved from 1.77 to 1.48, baseline 1.3-1.5. -Encourage adequate p.o. intake  Hypertension Normotensive overnight.  -Continue amlodipine 10 mg daily  Hyperlipidemia Patient recently restarted on statin for elevated lipid panel.  -continue Atorvastatin 20 mg daily  History of alcohol/polysubstance abuse, stable -Continue to monitor and encourage cessation  GERD, stable -Continue Pepcid 20 mg daily  Gout, stable Asymptomatic -Continue allopurinol 200 mg daily  Constipation Last BM 10/24, continue to encourage Miralax.  -Continue MiraLAX  BPH, stable Patient voiding, no recorded output -Continue finasteride 5 mg daily -Continue Flomax 0.4 mg dailys  FEN/GI: Heart healthy/carb modified  PPx: pt encouraged to be up and ambulating regularly   Disposition: patient awaiting placement in ALF, medically stable for discharge  Subjective:  Patient seen this morning sitting upright in bed, states he feels good this morning, and is ready to leave the hospital.  He states he is occasionally seeing objects flying around the room with the appearance of flies, bees, and occasionally birds.  He states that last night he saw a bird hop across the floor and into the shower behind the shower curtain.  The patient states he is unable to differentiate what is real and what is not and able to reorient himself.  He denies any auditory hallucinations.  No chest pain or shortness of breath.  Objective: Temp:  [97.8 F (36.6 C)-98.2 F (36.8 C)] 98.1 F (36.7 C) (11/02 0805) Pulse Rate:  [73-91] 89 (11/02 0805) Resp:  [18] 18 (11/02 0805) BP: (107-138)/(65-100) 133/91 (11/02 0805) SpO2:  [  96 %-99 %] 96 %  (11/02 0805) Physical Exam: General: No apparent distress, nontoxic-appearing, patient is alert and cooperative Cardiovascular: RRR, S1-S2 present, no murmurs appreciated Respiratory: Clear to auscultation bilaterally, normal work of breathing Abdomen: Soft, nontender, normal bowel sounds appreciated Extremities: No edema, cyanosis or deformity appreciated  Laboratory: No results for input(s): WBC, HGB, HCT, PLT in the last 168 hours. No results for input(s): NA, K, CL, CO2, BUN, CREATININE, CALCIUM, PROT, BILITOT, ALKPHOS, ALT, AST, GLUCOSE in the last 168 hours.  Invalid input(s): LABALBU  Imaging/Diagnostic Tests: No new imaging  Dollene Cleveland, DO 03/17/2019, 2:58 PM PGY-2, [place] [facility] FPTS Intern pager: [phone], text pages welcome

## 2019-03-18 DIAGNOSIS — E875 Hyperkalemia: Secondary | ICD-10-CM

## 2019-03-18 LAB — GLUCOSE, CAPILLARY
Glucose-Capillary: 134 mg/dL — ABNORMAL HIGH (ref 70–99)
Glucose-Capillary: 190 mg/dL — ABNORMAL HIGH (ref 70–99)

## 2019-03-18 MED ORDER — BENZTROPINE MESYLATE 1 MG PO TABS
1.0000 mg | ORAL_TABLET | Freq: Two times a day (BID) | ORAL | Status: DC
  Administered 2019-03-18 – 2019-03-21 (×6): 1 mg via ORAL
  Filled 2019-03-18 (×7): qty 1

## 2019-03-18 NOTE — Progress Notes (Signed)
Physical Therapy Treatment Patient Details Name: Cassey Hurrell MRN: [id number] DOB: [birth year] Today's Date: 03/18/2019    History of Present Illness Pt is a 63 y/o male admitted secondary to hallucinations and AMS likely secondary to medications. Pt also with hyperkalemia. PMH includes schizophrenia and DM.     PT Comments    Pt was seen for gait training on RW or with HHA, and he is able to walk with supervision to min guard.  He is safer with cues to control pace, avoid obstacles and monitor his own balance.  Pt was having some agitation when PT talked with him, noting that he is feeling like he does not have enough therapy yet wants to walk alone on the hall.  Talked with nursing about his agitation today, and have reassured him that the food items he wants are from the kitchen and not on the hall.  Continue on a weekly basis to ensure pt is walking safely and that nursing is supervising his progress as well.   Follow Up Recommendations  No PT follow up(transfer to psych care)     Equipment Recommendations  None recommended by PT    Recommendations for Other Services       Precautions / Restrictions Precautions Precautions: Fall Precaution Comments: using no AD Restrictions Weight Bearing Restrictions: No    Mobility  Bed Mobility Overal bed mobility: Independent             General bed mobility comments: not requiring assist to stand  Transfers Overall transfer level: Needs assistance Equipment used: None   Sit to Stand: Supervision Stand pivot transfers: Min guard       General transfer comment: pt is mildly agitated and note that he is being impacted for balance control by his distraction   Ambulation/Gait Ambulation/Gait assistance: Supervision Gait Distance (Feet): 400 Feet Assistive device: None Gait Pattern/deviations: Step-through pattern;Decreased stride length;Wide base of support;Drifts right/left Gait velocity: Decreased Gait velocity  interpretation: <1.31 ft/sec, indicative of household ambulator General Gait Details: pt walks faster at times and begins to shuffle, but with cues to slow pace is more functional with better foot clearance   Stairs             Wheelchair Mobility    Modified Rankin (Stroke Patients Only)       Balance Overall balance assessment: Needs assistance Sitting-balance support: Feet supported Sitting balance-Leahy Scale: Good       Standing balance-Leahy Scale: Good                              Cognition Arousal/Alertness: Awake/alert Behavior During Therapy: Agitated Overall Cognitive Status: (has a psych history)                                 General Comments: agitated about a number of things, including insisting that the nurses keep ice cream cones on the hall and that he is not being seen for therapy      Exercises      General Comments General comments (skin integrity, edema, etc.): Pt is up to walk with PT and was noted to be a bit unsteady but can control his gait with vc's.  Has been argumentative with nursing today, and with PT was agitated and insisting that PT was not telling him the truth about ice cream cones.  Will need to work on more  mobility with nursing as PT is not needed for more intervention       Pertinent Vitals/Pain Pain Assessment: No/denies pain    Home Living                      Prior Function            PT Goals (current goals can now be found in the care plan section) Acute Rehab PT Goals Patient Stated Goal: to get an ice cream cone Progress towards PT goals: Progressing toward goals    Frequency    Min 1X/week      PT Plan Frequency needs to be updated    Co-evaluation              AM-PAC PT "6 Clicks" Mobility   Outcome Measure  Help needed turning from your back to your side while in a flat bed without using bedrails?: None Help needed moving from lying on your back to  sitting on the side of a flat bed without using bedrails?: None Help needed moving to and from a bed to a chair (including a wheelchair)?: None Help needed standing up from a chair using your arms (e.g., wheelchair or bedside chair)?: None Help needed to walk in hospital room?: A Little Help needed climbing 3-5 steps with a railing? : A Little 6 Click Score: 22    End of Session Equipment Utilized During Treatment: Gait belt Activity Tolerance: Patient tolerated treatment well Patient left: with call bell/phone within reach;in bed;Other (comment) Nurse Communication: Mobility status PT Visit Diagnosis: Unsteadiness on feet (R26.81);Muscle weakness (generalized) (M62.81)     Time: 2774-1287 PT Time Calculation (min) (ACUTE ONLY): 19 min  Charges:  $Therapeutic Exercise: 8-22 mins               Ivar Drape 03/18/2019, 7:04 PM   Samul Dada, PT MS Acute Rehab Dept. Number: [facility] [id number] and [facility] [phone]

## 2019-03-18 NOTE — Clinical Social Work Note (Signed)
Call made to [facility] [phone]) and talked with admissions director, Ms. Harris. [name] advised that they reviewed patient's clinicals and cannot offer a bed. Call made to [facility] [phone]) and was advised to call tomorrow after 10 am  to talk with resident care coordinator Eber Hong. [name] will continue efforts to locate ALF placement for patient.  Raynette Arras Givens, MSW, LCSW Licensed Clinical Social Worker [place] [phone]

## 2019-03-18 NOTE — Progress Notes (Addendum)
Family Medicine Teaching Service Daily Progress Note Intern Pager: [phone]  Patient name: Dylan Jordan Medical record number: [id number] Date of birth: [birth year] Age: 63 y.o. Gender: male  Primary Care Provider: Shirley, [place], DO Consultants: Psychiatry Code Status: Full code  Pt Overview and Major Events to Date:  10/8-admitted for hyperkalemia, with psychosis 10/16- patient recommended for SNF by PT, patient preferred to stay hospitalized after discharge order placed with plan to go to SNF  10/20- no available [facility] SN[facility], patient sister asked for preferred facilities in [place], [place] 10/21- re-evaluated by Psychiatry, no inpatient criteria, discontinued Haldol and added Olanzapine 5mg  10/23- [facility] unable to offer bed at this time, SW to continue with attempts for SNF placement 10/26- Sister notified of beds available in [facility] 10/30- Cogentin increased to 2 mg daily  10/30- Patient no longer qualifies for SNF, patient's  Sister notifed,  [name] began ALF process for placement  Assessment and Plan: Dylan Jordan a 63 y.o.malepresenting with hallucinations and elevated potassium.PMH is significant for schizophrenia, type 2 diabetes, CKD, hypertension, hyperlipidemia, Hx alcoholandpolysubstance abuse.  AMS 2/2 psychiatricmedicationsschizophrenia, improved Patient reports continued visual hallucinations of rats and dogs.  There are a nuisance to him, but they do not scare him. Is able to differentiate what is a hallucination and was real.  Patient continues to await placement for ALF. [id number] test was negative. Patient reports feeling sleepy. No gross tremor noted on exam. Spoke with SW Lorriane Shire this AM, she is still working on placement. -Continue Depakote 2.5 g daily -Continue lithium 300 mg twice daily -Continue olanzapine 10 mg daily -decrease Cogentin 1 mg twice daily -Patient received Haldol injection today 11/2 -SW for ALF  placement  Anxiety, stable in the hospital Asymptomatic. See psych meds above -Continue to monitor  Type 2 diabeteswith peripheral neuropathy, improving control  CBGs within 130-230 in past 24hs.  Patient currently on 35 units Lantus daily. ALF assistance seen as essential to keeping patient from readmission due to uncontrolled DM.  - Metformin 1000 mg daily - Lantus 35 units daily -CBG checks daily  -Gabapentin 600 mg 3 times daily   CKDstage III, stable Most recent creatinine improved from 1.77 to 1.48, baseline 1.3-1.5. -Encourage adequate p.o. intake  Hypertension Normotensive overnight.  -Continue amlodipine 10 mg daily  Hyperlipidemia Patient recently restarted on statin for elevated lipid panel.  -continue Atorvastatin 20 mg daily  History of alcohol/polysubstance abuse, stable -Continue to monitor and encourage cessation  GERD, stable -Continue Pepcid 20 mg daily  Gout, stable Asymptomatic -Continue allopurinol 200 mg daily  Constipation Last BM 10/24, continue to encourage Miralax.  -Continue MiraLAX  BPH, stable Patient voiding, no recorded output -Continue finasteride 5 mg daily -Continue Flomax 0.4 mg dailys  FEN/GI: Heart healthy/carb modified  PPx: pt encouraged to be up and ambulating regularly   Disposition: patient awaiting placement in ALF, medically stable for discharge  Subjective:  Patient still endorsing hallucinations of rats and dogs.  They are there for a moment but then gone.  They are not really bothering him but he does find them annoying.  Patient is prepared to go to ALF. Objective: Temp:  [97.6 F (36.4 C)-98.3 F (36.8 C)] 98.3 F (36.8 C) (11/02 2140) Pulse Rate:  [79-90] 79 (11/03 0440) Resp:  [16-18] 16 (11/03 0440) BP: (124-139)/(69-98) 127/83 (11/03 0440) SpO2:  [93 %-97 %] 93 % (11/03 0440) Physical Exam: General: No apparent distress, nontoxic-appearing, patient is alert and  cooperative Cardiovascular: RRR, S1-S2 present, no murmurs  appreciated Respiratory: Clear to auscultation bilaterally, normal work of breathing Abdomen: Soft, nontender, normal bowel sounds appreciated Extremities: No edema, cyanosis or deformity appreciated  Laboratory: No results for input(s): WBC, HGB, HCT, PLT in the last 168 hours. No results for input(s): NA, K, CL, CO2, BUN, CREATININE, CALCIUM, PROT, BILITOT, ALKPHOS, ALT, AST, GLUCOSE in the last 168 hours.  Invalid input(s): LABALBU  Imaging/Diagnostic Tests: No new imaging  Garnette Gunner, MD 03/18/2019, 7:47 AM PGY-3, [place] Family Medicine FPTS Intern pager: [phone], text pages welcome

## 2019-03-19 ENCOUNTER — Telehealth: Payer: Self-pay

## 2019-03-19 LAB — GLUCOSE, CAPILLARY: Glucose-Capillary: 123 mg/dL — ABNORMAL HIGH (ref 70–99)

## 2019-03-19 MED ORDER — POLYETHYLENE GLYCOL 3350 17 G PO PACK
17.0000 g | PACK | Freq: Every day | ORAL | Status: DC
  Administered 2019-03-19 – 2019-03-21 (×3): 17 g via ORAL
  Filled 2019-03-19 (×3): qty 1

## 2019-03-19 NOTE — Telephone Encounter (Signed)
It was a paper Pharmacy request that I entered in the computer. Salvatore Marvel, CMA

## 2019-03-19 NOTE — Progress Notes (Signed)
Family Medicine Teaching Service Daily Progress Note Intern Pager: [phone]  Patient name: Nitesh Pitstick Medical record number: [id number] Date of birth: Sep 11, 1955 Age: 63 y.o. Gender: male  Primary Care Provider: Natori Gudino, [place], DO Consultants: Psychiatry Code Status: Full code  Pt Overview and Major Events to Date:  10/8-admitted for hyperkalemia, with psychosis 10/16- patient recommended for SNF by PT, patient preferred to stay hospitalized after discharge order placed with plan to go to SNF  10/20- no available [place], patient sister asked for preferred facilities in [place], [place] 10/21- re-evaluated by Psychiatry, no inpatient criteria, discontinued Haldol and added Olanzapine 5mg  10/23- [facility] unable to offer bed at this time, SW to continue with attempts for SNF placement 10/26- Sister notified of beds available in [facility] 10/30- Cogentin increased to 2 mg daily  10/30- Patient no longer qualifies for SNF, patient's  Sister notifed,  [name] began ALF process for placement  Assessment and Plan: Jahrell Hamor a 63 y.o.malepresenting with hallucinations and elevated potassium.PMH is significant for schizophrenia, type 2 diabetes, CKD, hypertension, hyperlipidemia, Hx alcoholandpolysubstance abuse.  AMS 2/2 psychiatricmedicationsschizophrenia, improved Patient reports continued visual hallucinations of rats and dogs.  There are a nuisance to him, but they do not scare him. Is able to differentiate what is a hallucination and was real.  Patient continues to await placement for ALF. COVID-19 test was negative. Patient is stable for discharge. No gross tremor noted on exam.  -Continue Depakote 2.5 g daily -Continue lithium 300 mg twice daily -Continue olanzapine 10 mg daily -continued Cogentin 1 mg twice daily -Patient received Haldol injection 11/2 -SW for ALF placement, attempted to contact 13/2 today and left a voicemail for her to return  call -will re-consult psych for more medication adjustments given continued hallucinations as patient previously lived alone without aid or hallucinations until previous [facility] hospitalization.   Anxiety, stable in the hospital Asymptomatic. See psych meds above -Continue to monitor  Type 2 diabeteswith peripheral neuropathy, improving control  CBGs within 130-190 in past 24hs.  Patient currently on 35 units Lantus daily. ALF assistance seen as essential to keeping patient from readmission due to uncontrolled DM.  - Metformin 1000 mg daily - Lantus 35 units daily - CBG checks daily  - Gabapentin 600 mg 3 times daily   CKDstage III, stable Most recent creatinine improved from 1.77 to 1.48, baseline 1.3-1.5. -Encourage adequate p.o. intake  Hypertension Normotensive overnight.  -Continue amlodipine 10 mg daily  Hyperlipidemia Patient recently restarted on statin for elevated lipid panel.  -continue Atorvastatin 20 mg daily  History of alcohol/polysubstance abuse, stable -Continue to monitor and encourage cessation  GERD, stable -Continue Pepcid 20 mg daily  Gout, stable Asymptomatic -Continue allopurinol 200 mg daily  Constipation Last BM 10/24, continue to encourage Miralax.  -Continue MiraLAX  BPH, stable Patient voiding, no recorded output -Continue finasteride 5 mg daily -Continue Flomax 0.4 mg dailys  FEN/GI: Heart healthy/carb modified PPx: pt encouraged to be up and ambulating regularly   Disposition: Medically stable for discharge, awaiting placement for ALF  Subjective:  Patient requesting that I call and update his niece on the medications that he is taking as well as his care plan.  He states that he does not believe that he could go home on his own as he is still actively hallucinating, although he does note that he is aware that they are hallucinations.  He states that he does not feel that he is a harm to himself or others at  this time.  He  has no current complaints at this time.  After speaking with niece over the phone she is very concerned that he is continuing to have hallucinations as he previously was without them and living at home on his own.  She states that they do not feel safe with him going home.  He does still retain his apartment that his niece pays for, however she does have a full-time job and would not be able to take care of him as needed.  She is frustrated that psychiatry is not following him more frequently and making more medication adjustments in order to help with his hallucinations.  She would like to be kept in the loop regarding his plan of action.  Objective: Temp:  [98 F (36.7 C)] 98 F (36.7 C) (11/04 0832) Pulse Rate:  [79-86] 86 (11/04 0832) Resp:  [18-20] 20 (11/04 0832) BP: (133-159)/(77-98) 159/98 (11/04 0832) SpO2:  [95 %-98 %] 98 % (11/04 5188) Physical Exam: General: NAD, pleasant Cardiovascular: no LE edema normal work of breathing MSK: moves 4 extremities equally, normal gait Derm: no rashes appreciated Psych: AO, appropriate affect, pressured speech, not currently endorsing active hallucinations but reports that he has earlier in the day, no SI/HI  Laboratory: No results for input(s): WBC, HGB, HCT, PLT in the last 168 hours. No results for input(s): NA, K, CL, CO2, BUN, CREATININE, CALCIUM, PROT, BILITOT, ALKPHOS, ALT, AST, GLUCOSE in the last 168 hours.  Invalid input(s): LABALBU  Imaging/Diagnostic Tests: No new imaging  Lorilyn Laitinen, [place], DO 03/19/2019, 8:52 AM PGY-3, [facility] Intern pager: [phone], text pages welcome

## 2019-03-20 LAB — COMPREHENSIVE METABOLIC PANEL
ALT: 10 U/L (ref 0–44)
AST: 15 U/L (ref 15–41)
Albumin: 3.1 g/dL — ABNORMAL LOW (ref 3.5–5.0)
Alkaline Phosphatase: 59 U/L (ref 38–126)
Anion gap: 9 (ref 5–15)
BUN: 30 mg/dL — ABNORMAL HIGH (ref 8–23)
CO2: 20 mmol/L — ABNORMAL LOW (ref 22–32)
Calcium: 9.6 mg/dL (ref 8.9–10.3)
Chloride: 109 mmol/L (ref 98–111)
Creatinine, Ser: 1.91 mg/dL — ABNORMAL HIGH (ref 0.61–1.24)
GFR calc Af Amer: 43 mL/min — ABNORMAL LOW (ref >60)
GFR calc non Af Amer: 37 mL/min — ABNORMAL LOW (ref >60)
Glucose, Bld: 212 mg/dL — ABNORMAL HIGH (ref 70–99)
Potassium: 4.8 mmol/L (ref 3.5–5.1)
Sodium: 138 mmol/L (ref 135–145)
Total Bilirubin: 0.5 mg/dL (ref 0.3–1.2)
Total Protein: 7 g/dL (ref 6.5–8.1)

## 2019-03-20 LAB — GLUCOSE, CAPILLARY: Glucose-Capillary: 98 mg/dL (ref 70–99)

## 2019-03-20 NOTE — Progress Notes (Signed)
Received call from [place], [place] and RN.  Patient has been declined from several ALF, limited openings. Family would like to consider personal care assistant at patient's apartment. Will sign form and discuss further with team/family.  Dorris Singh, MD  Family Medicine Teaching Service

## 2019-03-20 NOTE — Progress Notes (Signed)
Spoke to [place], patient's niece over the phone. The following bullet points are summary:   Niece and sister do not want patient to go home due to variables of care if they were to send him home.   Niece wants to try to restart haldol and come off of the olanzipine.   Niece provided in depth history of patient's status over the last few months. She reports that patient started to become unstable after trialing Vraylar and taken off of haldol. Vraylar was discontinued and haldol was put back on (unsure of dose) He has been admitted to several facilities since. They believe that haldol was working for him for 9 years and would like to transition back. She would like to start at a low dose and titrate.    Consult psychiatry tomorrow and ask about their thoughts. Patient is likely going to stay here until ALF available, which could be a while.   Wilber Oliphant, M.D.  3:58 PM 03/20/2019

## 2019-03-20 NOTE — Plan of Care (Signed)
  Problem: Health Behavior/Discharge Planning: Goal: Ability to manage health-related needs will improve Outcome: Progressing   Problem: Coping: Goal: Level of anxiety will decrease Outcome: Progressing   

## 2019-03-20 NOTE — TOC Progression Note (Signed)
Transition of Care [facility]) - Progression Note    Patient Details  Name: Dylan Jordan MRN: [id number] Date of Birth: 04-11-1956  Transition of [facility]) CM/SW Contact  Bartholomew Crews, RN Phone Number: [phone] 03/20/2019, 2:08 PM  Clinical Narrative:    Attempt to contact [facility] via phone. Voice mail left with NCM contact number.   Reached out to [facility]. Not accepting new patients at this time, but requested NCM fax [facility] - faxed to [phone].   Spoke with patient's niece, Claudina Lick, to discuss difficulty with finding medicaid ALF bed. Discussed potential alternative of having patient return home to his [address] apartment with [facility] services through [facility]. Potential home care agencies discussed, [facility], [facility], and [facility]. Expedited PCS form completed, signed by MD, and faxed to [employer] at [phone] with successful transmission notification.   Received call from patient's sister, Heron Sabins at [phone]. [name] asked about facilities that she had provided. Advised that the facilities were SNFs and patient no longer qualified for SNF but needed ALF. Sister to continue search for ALF. Sister also asked about the number of days that PCS workers would come into the home stating patient would need 7 days a week.   TOC follow for transitions.   Expected Discharge Plan: Assisted Living Barriers to Discharge: Inadequate or no insurance, Unsafe home situation(ALF placement (private pay))  Expected Discharge Plan and Services Expected Discharge Plan: Assisted Living In-house Referral: Clinical Social Work Discharge Planning Services: NA Post Acute Care Choice: Nursing Home Living arrangements for the past 2 months: Apartment Expected Discharge Date: 02/28/19               DME Arranged: N/A DME Agency: NA       HH Arranged: NA           Social Determinants of Health (SDOH) Interventions    Readmission Risk  Interventions No flowsheet data found.

## 2019-03-20 NOTE — Progress Notes (Addendum)
Interim Progress Note  AKI  Obtained CMP on patient earlier today. Showing decreased renal function, but K and hepatic function wnl. Have not changed any of patient's medication but does remain on home meds that affect renal function(allopurinol, metformin). Olanzapine should be used with caution in patients with kidney disease. It is charted that patient had 2.160 L fluid yesterday and 1.740 L PO intake the day prior. Thus, unlikely to be 2/2 dehydration. Will continue to monitor and encourage increased PO intake. If no improvement, will need to consider changing olanzapine.  - monitor UOP for the next couple of days q shift.  - AM BMP  - encourage PO fluids  - can consider decreasing/eliminating metformin temporarily. His blood sugars have been stable this hospitalization; however, he does have trouble with management at home. Risk/benefit discussion   Wilber Oliphant, M.D.  2:53 PM 03/20/2019

## 2019-03-20 NOTE — Telephone Encounter (Signed)
Patient is currently hospitalized and is not taking any iron supplementation.  We will follow-up at his next visit regarding if he needs any iron supplementation.

## 2019-03-20 NOTE — Progress Notes (Addendum)
Family Medicine Teaching Service Daily Progress Note Intern Pager: [phone]  Patient name: Dylan Jordan Medical record number: [id number] Date of birth: April 22, 1956 Age: 63 y.o. Gender: male  Primary Care Provider: [place], [place], DO Consultants: Psychiatry Code Status: Full Code   Pt Overview and Major Events to Date:  Hospital Day: 29 02/20/2019: admitted for hallucinations/psychosis, hyperkalemia 10/8-admitted for hyperkalemia, with psychosis 10/16- patient recommended for SNF by PT, patient preferred to stay hospitalized after discharge order placed with plan to go to SNF  10/20- no available [place] SNF, patient sister asked for preferred facilities in [place], [place] 10/21- re-evaluated by Psychiatry, no inpatient criteria, discontinued Haldol and added Olanzapine 5mg  10/23- [facility] unable to offer bed at this time, SW to continue with attempts for SNF placement 10/26- Sister notified of beds available in [facility] 10/30- Cogentin increased to 2 mg daily  10/30- Patient no longer qualifies for SNF, patient's Sister notifed, [name] began ALF process for placement 11/3 -[facility] cannot offer bed, f/u with [place] [facility]   Assessment and Plan: Dylan Jordan a 63 y.o.malepresenting with hallucinations and elevated potassium.PMH is significant for schizophrenia, type 2 diabetes, CKD, hypertension, hyperlipidemia, Hx alcoholandpolysubstance abuse.  AMS 2/2 psychiatricmedicationsschizophrenia, improved Hallucinations, continued (new just PTA).  Pt is sleepy this morning and will not open his eyes. He denies any hallucinations this morning. Patient is stable for discharge to ALF when available. COVID negative  (11/2). PT/OT - fall precautions, no PT follow up.  -Continue Depakote 2.5 g daily -Continue lithium 300 mg twice daily -Continue olanzapine 10 mg daily -continued Cogentin 1 mg twice daily -SW for ALF placement, will reach out to [facility]  today  -will re-consult psych for more medication adjustments given continued hallucinations as patient previously lived alone without aid or hallucinations until previous [facility] hospitalization. (consult placed on 03/19/19)  Anxiety, stable in the hospital Asymptomatic. See psych meds above -Continue to monitor  Type 2 diabeteswith peripheral neuropathy, improving control  CBG 98this morning at 0700. No others documented. Patient currently on 35 units Lantus daily. ALF assistance seen as essential to keeping patient from readmission due to uncontrolled DM.  - Metformin 1000 mg daily - Lantus 35 units daily - CBG checks daily  - Gabapentin 600 mg 3 times daily   CKDstage III, stable Baseline Cr 1.3-1.5. On 10/26, Cr 1.48. No labs since. Once weekly labs  -Encourage adequate p.o. intake -CMP given changes in medications   Hypertension Normotensive overnight.  -Continue amlodipine 10 mg daily  Hyperlipidemia Patient recently restarted on statin for elevated lipid panel.  -continue Atorvastatin 20 mg daily  History of alcohol/polysubstance abuse, stable -Continue to monitor and encourage cessation  GERD, stable -Continue Pepcid 20 mg daily  Gout, stable Asymptomatic -Continue allopurinol 200 mg daily  Constipation Last BM 10/24, continue to encourage Miralax.  -Continue MiraLAX  BPH, stable Patient voiding, no recorded output -Continue finasteride 5 mg daily -Continue Flomax 0.4 mg dailys  FEN/GI: Heart healthy/carb modified PPx: pt encouraged to be up and ambulating regularly   Disposition: Medically stable for discharge, awaiting placement for ALF  Subjective:  NAEO.   Objective: Temp:  [98 F (36.7 C)-98.8 F (37.1 C)] 98.8 F (37.1 C) (11/05 0501) Pulse Rate:  [70-86] 75 (11/05 0501) Resp:  [18-20] 18 (11/05 0501) BP: (145-159)/(80-98) 150/80 (11/05 0501) SpO2:  [98 %-100 %] 98 % (11/05 0501) Intake/Output      11/04 0701 - 11/05 0700    P.O. 2160  Total Intake(mL/kg) 2160 (19.5)   Urine (mL/kg/hr) 0 (0)   Emesis/NG output 0   Stool 0   Total Output 0   Net +2160       Urine Occurrence 2 x   Stool Occurrence 0 x   Emesis Occurrence 0 x       Physical Exam: General: NAD, non-toxic, well-appearing, sleeping in bed on right side. Does not open eyes to voice, but does speak back.  HEENT: [place]/AT. PERRLA. EOMI.  Cardiovascular: RRR, normal S1, S2. B/L 2+ RP. No BLEE Respiratory: CTAB. No IWOB.  Abdomen: + BS. NT, ND, soft to palpation.   Laboratory: I have personally read and reviewed all labs and imaging studies.  CBC: No results for input(s): WBC, NEUTROABS, HGB, HCT, MCV, PLT in the last 168 hours. CMP: No results for input(s): NA, K, CL, CO2, GLUCOSE, BUN, CREATININE, CALCIUM, MG, PHOS, ALBUMIN in the last 168 hours. CBG: Recent Labs  Lab 03/16/19 2222 03/17/19 2142 03/18/19 0659 03/18/19 1113 03/19/19 0519  GLUCAP 177* 231* 134* 190* 123*   Micro: Covid Negative  Recent Results (from the past 240 hour(s))  SARS CORONAVIRUS 2 (TAT 6-24 HRS) Nasopharyngeal Nasopharyngeal Swab     Status: None   Collection Time: 03/17/19 12:27 PM   Specimen: Nasopharyngeal Swab  Result Value Ref Range Status   SARS Coronavirus 2 NEGATIVE NEGATIVE Final    Comment: (NOTE) SARS-CoV-2 target nucleic acids are NOT DETECTED. The SARS-CoV-2 RNA is generally detectable in upper and lower respiratory specimens during the acute phase of infection. Negative results do not preclude SARS-CoV-2 infection, do not rule out co-infections with other pathogens, and should not be used as the sole basis for treatment or other patient management decisions. Negative results must be combined with clinical observations, patient history, and epidemiological information. The expected result is Negative. Fact Sheet for Patients: HairSlick.no Fact Sheet for Healthcare  Providers: quierodirigir.com This test is not yet approved or cleared by the [place] FDA and  has been authorized for detection and/or diagnosis of SARS-CoV-2 by FDA under an Emergency Use Authorization (EUA). This EUA will remain  in effect (meaning this test can be used) for the duration of the COVID-19 declaration under Section 56 4(b)(1) of the Act, 21 U.S.C. section 360bbb-3(b)(1), unless the authorization is terminated or revoked sooner. Performed at [facility], [address] [place], [place] [address]      Imaging/Diagnostic Tests: No results found.  EKG Interpretation  Date/Time:  Thursday February 20 2019 13:14:10 EDT Ventricular Rate:  83 PR Interval:    QRS Duration: 89 QT Interval:  378 QTC Calculation: 445 R Axis:   23 Text Interpretation:  Sinus rhythm Low voltage, precordial leads Confirmed by Bethann Berkshire [phone]) on 02/20/2019 3:29:11 PM        Melene Plan, MD 03/20/2019, 6:38 AM PGY-2, [place] [facility] FPTS Intern pager: [phone], text pages welcome

## 2019-03-21 DIAGNOSIS — R4182 Altered mental status, unspecified: Secondary | ICD-10-CM

## 2019-03-21 DIAGNOSIS — R441 Visual hallucinations: Secondary | ICD-10-CM

## 2019-03-21 LAB — BASIC METABOLIC PANEL
Anion gap: 7 (ref 5–15)
BUN: 27 mg/dL — ABNORMAL HIGH (ref 8–23)
CO2: 22 mmol/L (ref 22–32)
Calcium: 9.5 mg/dL (ref 8.9–10.3)
Chloride: 113 mmol/L — ABNORMAL HIGH (ref 98–111)
Creatinine, Ser: 1.77 mg/dL — ABNORMAL HIGH (ref 0.61–1.24)
GFR calc Af Amer: 47 mL/min — ABNORMAL LOW (ref >60)
GFR calc non Af Amer: 40 mL/min — ABNORMAL LOW (ref >60)
Glucose, Bld: 95 mg/dL (ref 70–99)
Potassium: 4.6 mmol/L (ref 3.5–5.1)
Sodium: 142 mmol/L (ref 135–145)

## 2019-03-21 LAB — GLUCOSE, CAPILLARY: Glucose-Capillary: 92 mg/dL (ref 70–99)

## 2019-03-21 MED ORDER — POLYETHYLENE GLYCOL 3350 17 G PO PACK
17.0000 g | PACK | Freq: Two times a day (BID) | ORAL | Status: DC
  Administered 2019-03-22 – 2019-03-28 (×12): 17 g via ORAL
  Filled 2019-03-21 (×16): qty 1

## 2019-03-21 MED ORDER — HALOPERIDOL 5 MG PO TABS
5.0000 mg | ORAL_TABLET | Freq: Two times a day (BID) | ORAL | Status: DC
  Administered 2019-03-21 – 2019-03-24 (×8): 5 mg via ORAL
  Filled 2019-03-21 (×9): qty 1

## 2019-03-21 MED ORDER — BENZTROPINE MESYLATE 1 MG PO TABS
1.0000 mg | ORAL_TABLET | Freq: Every day | ORAL | Status: DC
  Administered 2019-03-22 – 2019-03-29 (×8): 1 mg via ORAL
  Filled 2019-03-21 (×8): qty 1

## 2019-03-21 NOTE — Clinical Social Work Note (Signed)
[name]. 1. [facility] - [phone] - Call made and message left. 2. [facility] - Message left 3. [facility] [employer] [facility] [phone]      Message left for resident care coordinator 4. [facility] - [phone] - Left name and number with receptionist 5. [facility]: Talked with Claudette Head and was informed that they do not accept      Medicaid. She suggested that the following facilities be contacted: [facility],      [facility] (contacted by Abigail Butts, Armed forces operational officer and they are not      accepting new residents at this time), [facility] in [place], [facility] and [facility].     [name] will continue search for an [place] for patient.  Dylan Jordan, MSW, LCSW Licensed Clinical Social Worker [place] [phone]

## 2019-03-21 NOTE — Consult Note (Addendum)
[facility] Face-to-Face Psychiatry Consult   Reason for Consult:  Hallucinations  Referring Physician:  Dr Sharol Harness Patient Identification: Dylan Jordan MRN:  [id number] Principal Diagnosis:  Diagnosis:  Schizoaffective d/o, bipolar type  Total Time spent with patient:  One hour  Subjective:   Dylan Jordan is a 63 y.o. male patient admitted with AMS and hallucinations, hyperkalemia.  "I am doing well."  Patient seen and evaluated in person by this provider.  Reports seeing hallucinations and explains Haldol assists these concerns as he continues to see things but does not want to respond to them.  No auditory hallucinations, suicidal/homicidal ideations, or substance abuse.  Patient was on Haldol dec in the past, recommend oral Haldol (see treatment plan below) and follow up with outpatient for Haldol dec (last noted it was due on 11/2).  He can receive this medications at [facility] since he reports living in [place], psychiatrically stable at this time.  HPI per MD:  Briefly, 63 yo M with history of schizophrenia, diabetes, hyperlipidemia, gout presenting with AMS and hallucinations in setting of recent discharge from a prolonged psychiatric hospitalization one day prior to presentation.  MRI and CT do not demonstrate acute intracranial pathology. Suspect this is largely psychiatrically mediated and the hyperkalemia is an incidental finding (which needs to be treated, nonetheless).  Longstanding mild anemia noted - would get iron studies at some point to assess (whether inpatient or outpatient).  He did complain of vague groin discomfort to me though history is extremely limited by his psychiatric disease and garbled speech. He reports his R testicle has been larger than the L since the springtime. Could not really say whether he has dysuria (answer changed from moment to moment). He does urinate frequently and takes flomax and finasteride. Exam notable for fairly marked tenderness of the posterior  aspect of the  R testicle, without warmth, swelling, or erythema. (Exam performed with sitter Janine Limbo present in the room.). UA was unremarkable on admission. Will check gc/chlamydia in urine and obtain scrotal ultrasound.  Past Psychiatric History: schizophrenia  Risk to Self: Suicidal Ideation: No Suicidal Intent: No Is patient at risk for suicide?: No Suicidal Plan?: No Access to Means: No What has been your use of drugs/alcohol within the last 12 months?: NA How many times?: 0 Other Self Harm Risks: NA Triggers for Past Attempts: None known Intentional Self Injurious Behavior: None Risk to Others: Homicidal Ideation: No Thoughts of Harm to Others: No Current Homicidal Intent: No Current Homicidal Plan: No Access to Homicidal Means: No Identified Victim: NA History of harm to others?: No Assessment of Violence: None Noted Violent Behavior Description: NA Does patient have access to weapons?: No Criminal Charges Pending?: No Does patient have a court date: No Prior Inpatient Therapy: Prior Inpatient Therapy: Yes Prior Therapy Dates: 2020 Prior Therapy Facilty/Provider(s): [facility] Reason for Treatment: Schiziphrenia Prior Outpatient Therapy: Prior Outpatient Therapy: Yes Prior Therapy Dates: Current. Prior Therapy Facilty/Provider(s): [name].  Reason for Treatment: Medication management and counseling.  Does patient have an ACCT team?: No Does patient have Intensive In-House Services?  : No Does patient have [name] services? : Yes Does patient have P4CC services?: No  Past Medical History:  Past Medical History:  Diagnosis Date  . Anemia   . Asthma   . Diabetes mellitus without complication (HCC)   . Gout   . Hyperlipidemia   . Hyperosmolar hyperglycemic coma due to diabetes mellitus without ketoacidosis (HCC)   . Hypertension   . Schizophrenia (HCC) 07/06/2018  Past Surgical History:  Procedure Laterality Date  . REPLACEMENT TOTAL KNEE Left   . TOE  SURGERY     Family History:  Family History  Adopted: Yes  Problem Relation Age of Onset  . Allergic rhinitis Neg Hx   . Angioedema Neg Hx   . Asthma Neg Hx   . Atopy Neg Hx   . Eczema Neg Hx   . Immunodeficiency Neg Hx   . Urticaria Neg Hx    Family Psychiatric  History: none Social History:  Social History   Substance and Sexual Activity  Alcohol Use No     Social History   Substance and Sexual Activity  Drug Use No    Social History   Socioeconomic History  . Marital status: Single    Spouse name: Not on file  . Number of children: Not on file  . Years of education: Not on file  . Highest education level: Not on file  Occupational History  . Not on file  Social Needs  . Financial resource strain: Somewhat hard  . Food insecurity    Worry: Sometimes true    Inability: Sometimes true  . Transportation needs    Medical: No    Non-medical: No  Tobacco Use  . Smoking status: Current Some Day Smoker    Packs/day: 0.30    Years: 45.00    Pack years: 13.50    Types: Cigarettes    Start date: 05/16/1971  . Smokeless tobacco: Never Used  . Tobacco comment: Decreased Intake to 10 cigarettes per week or less.  Substance and Sexual Activity  . Alcohol use: No  . Drug use: No  . Sexual activity: Not on file  Lifestyle  . Physical activity    Days per week: Not on file    Minutes per session: Not on file  . Stress: Not on file  Relationships  . Social Musician on phone: Not on file    Gets together: Not on file    Attends religious service: Not on file    Active member of club or organization: Not on file    Attends meetings of clubs or organizations: Not on file    Relationship status: Not on file  Other Topics Concern  . Not on file  Social History Narrative   Lives by himself at home. Closest family member is niece.       ETOH none   Drugs non e   Current smoker, none for three days    Additional Social History:    Allergies:    Allergies  Allergen Reactions  . Chantix [Varenicline Tartrate] Other (See Comments)    Nightmares  . Ativan [Lorazepam] Other (See Comments)    Agitation (and "5 others like it") Will need to call [facility] [place] (phone) [phone] and [phone] to fax a release-   . Shellfish-Derived Products Itching  . Penicillins Diarrhea    Has patient had a PCN reaction causing immediate rash, facial/tongue/throat swelling, SOB or lightheadedness with hypotension: No Has patient had a PCN reaction causing severe rash involving mucus membranes or skin necrosis: No Has patient had a PCN reaction that required hospitalization: No Has patient had a PCN reaction occurring within the last 10 years: No If all of the above answers are "NO", then may proceed with Cephalosporin use.     Labs:  Results for orders placed or performed during the hospital encounter of 02/20/19 (from the past 48 hour(s))  Glucose, capillary     Status: None   Collection Time: 03/20/19  7:17 AM  Result Value Ref Range   Glucose-Capillary 98 70 - 99 mg/dL  Comprehensive metabolic panel     Status: Abnormal   Collection Time: 03/20/19 12:02 PM  Result Value Ref Range   Sodium 138 135 - 145 mmol/L   Potassium 4.8 3.5 - 5.1 mmol/L   Chloride 109 98 - 111 mmol/L   CO2 20 (L) 22 - 32 mmol/L   Glucose, Bld 212 (H) 70 - 99 mg/dL   BUN 30 (H) 8 - 23 mg/dL   Creatinine, Ser 1.91 (H) 0.61 - 1.24 mg/dL   Calcium 9.6 8.9 - 10.3 mg/dL   Total Protein 7.0 6.5 - 8.1 g/dL   Albumin 3.1 (L) 3.5 - 5.0 g/dL   AST 15 15 - 41 U/L   ALT 10 0 - 44 U/L   Alkaline Phosphatase 59 38 - 126 U/L   Total Bilirubin 0.5 0.3 - 1.2 mg/dL   GFR calc non Af Amer 37 (L) >60 mL/min   GFR calc Af Amer 43 (L) >60 mL/min   Anion gap 9 5 - 15    Comment: Performed at [facility], [address] [place], [place] [address]  Glucose, capillary     Status: None   Collection Time: 03/21/19  5:34 AM  Result Value Ref Range    Glucose-Capillary 92 70 - 99 mg/dL  BMP AM 0500     Status: Abnormal   Collection Time: 03/21/19  6:05 AM  Result Value Ref Range   Sodium 142 135 - 145 mmol/L   Potassium 4.6 3.5 - 5.1 mmol/L   Chloride 113 (H) 98 - 111 mmol/L   CO2 22 22 - 32 mmol/L   Glucose, Bld 95 70 - 99 mg/dL   BUN 27 (H) 8 - 23 mg/dL   Creatinine, Ser 1.77 (H) 0.61 - 1.24 mg/dL   Calcium 9.5 8.9 - 10.3 mg/dL   GFR calc non Af Amer 40 (L) >60 mL/min   GFR calc Af Amer 47 (L) >60 mL/min   Anion gap 7 5 - 15    Comment: Performed at [facility], [place] [address] [place], [place] [address]    Current Facility-Administered Medications  Medication Dose Route Frequency Provider Last Rate Last Dose  . acetaminophen (TYLENOL) tablet 650 mg  650 mg Oral Q6H PRN Rory Percy, DO   650 mg at 03/13/19 1717   Or  . acetaminophen (TYLENOL) suppository 650 mg  650 mg Rectal Q6H PRN Rory Percy, DO      . albuterol (PROVENTIL) (2.5 MG/3ML) 0.083% nebulizer solution 3 mL  3 mL Inhalation Q6H PRN Rory Percy, DO      . allopurinol (ZYLOPRIM) tablet 200 mg  200 mg Oral Daily Rory Percy, DO   200 mg at 03/21/19 0841  . amLODipine (NORVASC) tablet 10 mg  10 mg Oral Daily Rory Percy, DO   10 mg at 03/21/19 0840  . aspirin chewable tablet 81 mg  81 mg Oral Daily Matilde Haymaker, MD   81 mg at 03/21/19 0840  . atorvastatin (LIPITOR) tablet 20 mg  20 mg Oral q1800 Carollee Leitz, MD   20 mg at 03/20/19 1648  . benztropine (COGENTIN) tablet 1 mg  1 mg Oral BID Guadalupe Dawn, MD   1 mg at 03/21/19 1005  . divalproex (DEPAKOTE) DR tablet 2,000 mg  2,000 mg Oral QPM Rory Percy, DO  2,000 mg at 03/20/19 1646  . divalproex (DEPAKOTE) DR tablet 500 mg  500 mg Oral q morning - 10a Rumball, Alison, DO   500 mg at 03/21/19 0840  . famotidine (PEPCID) tablet 20 mg  20 mg Oral BID Ellwood Dense, DO   20 mg at 03/21/19 0840  . finasteride (PROSCAR) tablet 5 mg  5 mg Oral Daily Ellwood Dense, DO   5 mg at 03/21/19  0841  . gabapentin (NEURONTIN) capsule 600 mg  600 mg Oral TID Ellwood Dense, DO   600 mg at 03/21/19 0842  . hydrOXYzine (ATARAX/VISTARIL) tablet 25 mg  25 mg Oral Q6H PRN Charm Rings, NP   25 mg at 03/07/19 1007  . insulin glargine (LANTUS) injection 35 Units  35 Units Subcutaneous Daily Mullis, Kiersten P, DO   35 Units at 03/21/19 1006  . lithium carbonate capsule 300 mg  300 mg Oral BID WC Ellwood Dense, DO   300 mg at 03/21/19 1610  . metFORMIN (GLUCOPHAGE-XR) 24 hr tablet 1,000 mg  1,000 mg Oral Q breakfast Ellwood Dense, DO   1,000 mg at 03/21/19 9604  . OLANZapine (ZYPREXA) tablet 10 mg  10 mg Oral QHS Dana Allan, MD   10 mg at 03/20/19 2148  . polyethylene glycol (MIRALAX / GLYCOLAX) packet 17 g  17 g Oral BID Shirley, [place], DO      . senna (SENOKOT) tablet 8.6 mg  1 tablet Oral Daily Nicki Guadalajara, MD   8.6 mg at 03/21/19 0841  . tamsulosin (FLOMAX) capsule 0.4 mg  0.4 mg Oral QPC breakfast Ellwood Dense, DO   0.4 mg at 03/21/19 5409    Musculoskeletal: Strength & Muscle Tone: within normal limits Gait & Station: normal Patient leans: N/A  Psychiatric Specialty Exam: Physical Exam  Nursing note and vitals reviewed. Constitutional: He is oriented to person, place, and time. He appears well-developed and well-nourished.  HENT:  Head: Normocephalic.  Neck: Normal range of motion.  Respiratory: Effort normal.  Musculoskeletal: Normal range of motion.  Neurological: He is alert and oriented to person, place, and time.  Psychiatric: His speech is normal. Judgment and thought content normal. His affect is blunt. He is actively hallucinating. Cognition and memory are normal.    Review of Systems  Psychiatric/Behavioral: Positive for hallucinations.  All other systems reviewed and are negative.   Blood pressure 116/75, pulse 74, temperature 98.8 F (37.1 C), resp. rate 18, height  (1.854 m), weight 111 kg, SpO2 94 %.Body mass index is 32.29 kg/m.  General  Appearance: Casual  Eye Contact:  Good  Speech:  Slurred  Volume:  Normal  Mood:  Euthymic  Affect:  Blunt  Thought Process:  Coherent and Descriptions of Associations: Intact  Orientation:  Full (Time, Place, and Person)  Thought Content:  Hallucinations: Visual  Suicidal Thoughts:  No  Homicidal Thoughts:  No  Memory:  Immediate;   Fair Recent;   Fair Remote;   Fair  Judgement:  Fair  Insight:  Fair  Psychomotor Activity:  Normal  Concentration:  Concentration: Fair and Attention Span: Fair  Recall:  [employer] of Knowledge:  Fair  Language:  Fair  Akathisia:  No  Handed:  Right  AIMS (if indicated):     Assets:  Housing Leisure Time Physical Health Resilience  ADL's:  Intact  Cognition:  WNL  Sleep:       Treatment Plan Summary: Schizoaffective disorder, bipolar type: -Continue Depakote 500 mg in the and 2000  mg in the pm -Continue Lithium 300 mg BID -Recommend Haldol 5 mg BID and start Haldol dec outpatient or tomorrow if tolerating oral Haldol, Haldol dec 100 mg IM OR -Follow up with [name] for next haldol dec injection on 100 mg, follow up information in discharge instructions -Discontinue Zyprexa 10 mg at bedtime  EPS: -Recommend decreasing Cogentin 1 mg BID to 1 mg daily  Anxiety: -Continue gabapentin 600 mg TID -Continue hydroxyzine 25 mg every six hours PRN  Disposition: No evidence of imminent risk to self or others at present.    Nanine MeansJamison Lord, NP 03/21/2019 1:12 PM   Attest to NP note

## 2019-03-21 NOTE — Plan of Care (Signed)
?  Problem: Health Behavior/Discharge Planning: ?Goal: Ability to manage health-related needs will improve ?Outcome: Progressing ?  ?Problem: Elimination: ?Goal: Will not experience complications related to urinary retention ?Outcome: Progressing ?  ?

## 2019-03-21 NOTE — TOC Progression Note (Signed)
Transition of Care [facility]) - Progression Note    Patient Details  Name: Amritpal Shropshire MRN: [id number] Date of Birth: 06-Oct-1955  [facility]) CM/SW Contact  Bartholomew Crews, RN Phone Number: [phone] 03/21/2019, 2:32 PM  Clinical Narrative:    Spoke with Danford Bad on the phone this afternoon. They are still in search of ALFs for patient. Advised that patient can have a caregiver check in 7 days a week for 2 or 3 hours a day which could divided in half if needed. Discussed that his regular provider was also his inpatient provider, and that his medications were being adjusted and that psychiatry had been reconsulted. [name] expressed concerns about scary patient can be when he is hallucinating. TOC following for transition needs.    Expected Discharge Plan: Assisted Living Barriers to Discharge: Inadequate or no insurance, Unsafe home situation(ALF placement (private pay))  Expected Discharge Plan and Services Expected Discharge Plan: Assisted Living In-house Referral: Clinical Social Work Discharge Planning Services: NA Post Acute Care Choice: Nursing Home Living arrangements for the past 2 months: Apartment Expected Discharge Date: 02/28/19               DME Arranged: N/A DME Agency: NA       HH Arranged: NA           Social Determinants of Health (SDOH) Interventions    Readmission Risk Interventions No flowsheet data found.

## 2019-03-21 NOTE — TOC Progression Note (Signed)
Transition of Care [facility]) - Progression Note    Patient Details  Name: Dylan Jordan MRN: [id number] Date of Birth: Apr 28, 1956  [facility]) CM/SW Contact  Dylan Crews, RN Phone Number: [phone] 03/21/2019, 9:47 AM  Clinical Narrative:    Spoke with [employer] this morning to complete initial assessment by phone. They will send information to Medicaid who will reach out to selected home care agency likely on Monday. The agency will then reach out to family. Spoke with patient's niece, Dylan Jordan, who stated that patient's sister still wants patient to go to ALF feeling that patient cannot return to independent living environment while still hallucinating. Stated that MD has been adjusting medications and there has been some improvement. Stated that patient had been free of hallucinations for 8-9 years, and would like to see him free of them again. Family is working on finding a facility as well. MD notified of conversations with family. TOC following for transitions.    Expected Discharge Plan: Assisted Living Barriers to Discharge: Inadequate or no insurance, Unsafe home situation(ALF placement (private pay))  Expected Discharge Plan and Services Expected Discharge Plan: Assisted Living In-house Referral: Clinical Social Work Discharge Planning Services: NA Post Acute Care Choice: Nursing Home Living arrangements for the past 2 months: Apartment Expected Discharge Date: 02/28/19               DME Arranged: N/A DME Agency: NA       HH Arranged: NA           Social Determinants of Health (SDOH) Interventions    Readmission Risk Interventions No flowsheet data found.

## 2019-03-21 NOTE — Progress Notes (Signed)
Family Medicine Teaching Service Daily Progress Note Intern Pager: [phone]  Patient name: Dylan Jordan Medical record number: [id number] Date of birth: [birth year] Age: 63 y.o. Gender: male  Primary Care Provider: Patrina Andreas, [place], DO Consultants: Psychiatry Code Status: Full Code   Pt Overview and Major Events to Date:  Hospital Day: 30 02/20/2019: admitted for hallucinations/psychosis, hyperkalemia 10/8-admitted for hyperkalemia, with psychosis 10/16- patient recommended for SNF by PT, patient preferred to stay hospitalized after discharge order placed with plan to go to SNF  10/20- no available [place] SNF, patient sister asked for preferred facilities in [place], [place] 10/21- re-evaluated by Psychiatry, no inpatient criteria, discontinued Haldol and added Olanzapine 5mg  10/23- [facility] unable to offer bed at this time, SW to continue with attempts for SNF placement 10/26- Sister notified of beds available in [facility] 10/30- Cogentin increased to 2 mg daily  10/30- Patient no longer qualifies for SNF, patient's Sister notifed, [name] began ALF process for placement 11/3 -[facility] cannot offer bed, f/u with [facility]  11/6: Reevaluated by psychiatry, no inpatient criteria, added Haldol, discontinued olanzapine, decreased Cogentin  Assessment and Plan: Dylan Jordan a 63 y.o.malepresenting with hallucinations and elevated potassium.PMH is significant for schizophrenia, type 2 diabetes, CKD, hypertension, hyperlipidemia, Hx alcoholandpolysubstance abuse.  AMS 2/2 psychiatricmedicationsschizophrenia, improved Hallucinations, continued (new just PTA).  Pt is sleepy this morning and will not open his eyes. He denies any hallucinations this morning. Patient is stable for discharge to ALF when available. COVID negative  (11/2). PT/OT - fall precautions, no PT follow up.  -Continue Depakote 2.5 g daily (500 mg in the am, 2000 mg in the  pm) -Continue lithium 300 mg twice daily -Discontinued olanzapine 10 mg daily -Decreased Cogentin 1 mg twice daily to 1 mg daily -Started Haldol 5 mg BID and start Haldol dec outpatient or tomorrow if tolerating oral Haldol, Haldol dec 100 mg IM OR -SW/CM for ALF placement, have been speaking with case management regarding placement -psychiatry has made the recommendations above, appreciate their help  Anxiety, stable in the hospital Asymptomatic. See psych meds above -Continue to monitor -Gabapentin 600 mg 3 times daily -Continue hydroxyzine 25 mg every six hours PRN  Type 2 diabeteswith peripheral neuropathy, improving control  CBG 95this morning at 0700. No others documented. Patient currently on 35 units Lantus daily. ALF assistance seen as essential to keeping patient from readmission due to uncontrolled DM.  - Metformin 1000 mg daily - Lantus 35 units daily - CBG checks daily  - Gabapentin 600 mg 3 times daily   CKDstage III, stable Baseline Cr 1.3-1.5. On 10/26, Cr 1.48. No labs since. Once weekly labs  -Encourage adequate p.o. intake  Hypertension Normotensive overnight.  -Continue amlodipine 10 mg daily  Hyperlipidemia Patient recently restarted on statin for elevated lipid panel.  -continue Atorvastatin 20 mg daily  History of alcohol/polysubstance abuse, stable -Continue to monitor and encourage cessation  GERD, stable -Continue Pepcid 20 mg daily  Gout, stable Asymptomatic -Continue allopurinol 200 mg daily  Constipation Last BM 10/24, continue to encourage Miralax.  -Continue MiraLAX  BPH, stable Patient voiding, no recorded output -Continue finasteride 5 mg daily -Continue Flomax 0.4 mg dailys  FEN/GI: Heart healthy/carb modified PPx: pt encouraged to be up and ambulating regularly   Disposition: Medically stable for discharge, awaiting placement for ALF  Subjective:  NA EO  Objective: Temp:  [98.8 F (37.1 C)] 98.8 F  (37.1 C) (11/06 0535) Pulse Rate:  [74-89] 74 (11/06 0535)  Resp:  [18-19] 18 (11/06 0535) BP: (116-138)/(75-78) 116/75 (11/06 0535) SpO2:  [94 %] 94 % (11/06 0535) Intake/Output      11/05 0701 - 11/06 0700 11/06 0701 - 11/07 0700   P.O. 1022 240   Other  360   Total Intake(mL/kg) 1022 (9.2) 600 (5.4)   Urine (mL/kg/hr) 2 (0)    Emesis/NG output 0    Stool 0    Total Output 2    Net +1020 +600        Urine Occurrence 0 x 1 x   Stool Occurrence 0 x    Emesis Occurrence 0 x        Physical Exam: General: NAD, nontoxic, well-appearing, sleeping in bed on right side.  Does not open eyes to voice but does speak back.   HEENT: NCAT Neck: Supple, no LAD Cardiovascular: no LE edema Respiratory: normal work of breathing Derm: no rashes appreciated  Laboratory: I have personally read and reviewed all labs and imaging studies.  CBC: No results for input(s): WBC, NEUTROABS, HGB, HCT, MCV, PLT in the last 168 hours. CMP: Recent Labs  Lab 03/20/19 1202 03/21/19 0605  NA 138 142  K 4.8 4.6  CL 109 113*  CO2 20* 22  GLUCOSE 212* 95  BUN 30* 27*  CREATININE 1.91* 1.77*  CALCIUM 9.6 9.5  ALBUMIN 3.1*  --    CBG: Recent Labs  Lab 03/18/19 0659 03/18/19 1113 03/19/19 0519 03/20/19 0717 03/21/19 0534  GLUCAP 134* 190* 123* 98 92   Micro: Covid Negative  Recent Results (from the past 240 hour(s))  SARS CORONAVIRUS 2 (TAT 6-24 HRS) Nasopharyngeal Nasopharyngeal Swab     Status: None   Collection Time: 03/17/19 12:27 PM   Specimen: Nasopharyngeal Swab  Result Value Ref Range Status   SARS Coronavirus 2 NEGATIVE NEGATIVE Final    Comment: (NOTE) SARS-CoV-2 target nucleic acids are NOT DETECTED. The SARS-CoV-2 RNA is generally detectable in upper and lower respiratory specimens during the acute phase of infection. Negative results do not preclude SARS-CoV-2 infection, do not rule out co-infections with other pathogens, and should not be used as the sole basis for  treatment or other patient management decisions. Negative results must be combined with clinical observations, patient history, and epidemiological information. The expected result is Negative. Fact Sheet for Patients: HairSlick.no Fact Sheet for Healthcare Providers: quierodirigir.com This test is not yet approved or cleared by the [place] FDA and  has been authorized for detection and/or diagnosis of SARS-CoV-2 by FDA under an Emergency Use Authorization (EUA). This EUA will remain  in effect (meaning this test can be used) for the duration of the COVID-19 declaration under Section 56 4(b)(1) of the Act, 21 U.S.C. section 360bbb-3(b)(1), unless the authorization is terminated or revoked sooner. Performed at [facility], [address] [place], [place] [address]      Imaging/Diagnostic Tests: No results found.  EKG Interpretation  Date/Time:  Thursday February 20 2019 13:14:10 EDT Ventricular Rate:  83 PR Interval:    QRS Duration: 89 QT Interval:  378 QTC Calculation: 445 R Axis:   23 Text Interpretation:  Sinus rhythm Low voltage, precordial leads Confirmed by Bethann Berkshire [phone]) on 02/20/2019 3:29:11 PM        Aaleyah Witherow, [place], DO 03/21/2019, 2:42 PM PGY-3, [facility] FPTS Intern pager: [phone], text pages welcome

## 2019-03-22 LAB — BASIC METABOLIC PANEL
Anion gap: 8 (ref 5–15)
BUN: 26 mg/dL — ABNORMAL HIGH (ref 8–23)
CO2: 22 mmol/L (ref 22–32)
Calcium: 9.2 mg/dL (ref 8.9–10.3)
Chloride: 110 mmol/L (ref 98–111)
Creatinine, Ser: 1.72 mg/dL — ABNORMAL HIGH (ref 0.61–1.24)
GFR calc Af Amer: 48 mL/min — ABNORMAL LOW (ref >60)
GFR calc non Af Amer: 42 mL/min — ABNORMAL LOW (ref >60)
Glucose, Bld: 143 mg/dL — ABNORMAL HIGH (ref 70–99)
Potassium: 4.2 mmol/L (ref 3.5–5.1)
Sodium: 140 mmol/L (ref 135–145)

## 2019-03-22 NOTE — Progress Notes (Addendum)
Family Medicine Teaching Service Daily Progress Note Intern Pager: [phone]  Patient name: Dylan Jordan Medical record number: [id number] Date of birth: 1956-05-05 Age: 63 y.o. Gender: male  Primary Care Provider: Shirley, [place], DO Consultants: Psychiatry Code Status: Full Code   Pt Overview and Major Events to Date:  Hospital Day: 31 02/20/2019: admitted for hallucinations/psychosis, hyperkalemia 10/8-admitted for hyperkalemia, with psychosis 10/16- patient recommended for SNF by PT, patient preferred to stay hospitalized after discharge order placed with plan to go to SNF  10/20- no available [place] [facility], patient sister asked for preferred facilities in [place], [place] 10/21- re-evaluated by Psychiatry, no inpatient criteria, discontinued Haldol and added Olanzapine 5mg  10/23- [facility] unable to offer bed at this time, SW to continue with attempts for SNF placement 10/26- Sister notified of beds available in [facility] 10/30- Cogentin increased to 2 mg daily  10/30- Patient no longer qualifies for SNF, patient's Sister notifed, [name] began ALF process for placement 11/3 -[facility] cannot offer bed, f/u with [facility]  11/6: Reevaluated by psychiatry, no inpatient criteria, added Haldol, discontinued olanzapine, decreased Cogentin  Assessment and Plan: Dylan Jordan a 63 y.o.malepresenting with hallucinations and elevated potassium.PMH is significant for schizophrenia, type 2 diabetes, CKD, hypertension, hyperlipidemia, Hx alcoholandpolysubstance abuse.  AMS 2/2 psychiatricmedicationsschizophrenia, improved Hallucinations, continued  Continues to have hallucinations but has remained stable.  He has not been a danger to himself or others.  However, he does need supervision, which his family cannot fully provide currently. Patient is stable for discharge to ALF when available. COVID negative  (11/2). PT/OT - fall precautions, no PT follow up.   -Continue Depakote 2.5 g daily (500 mg in the am, 2000 mg in the pm) -Continue lithium 300 mg twice daily -Cogentin 1 mg once daily -Started Haldol 5 mg BID and can start Haldol decanoate 100 mg IM outpatient or on 11/7 if tolerating oral Haldol -SW/CM for ALF placement, have been speaking with case management regarding placement; patient needs 24 hr supervision which cannot be achieved with home health -psychiatry has made the recommendations above, appreciate their help  Anxiety, stable in the hospital Asymptomatic currently. See psych meds above -Continue to monitor -Gabapentin 600 mg 3 times daily -Continue hydroxyzine 25 mg every six hours PRN  Type 2 diabeteswith peripheral neuropathy, improving control  CBG 143this morning at 0400. Patient currently on 35 units Lantus daily. ALF assistance seen as essential to keeping patient from readmission due to uncontrolled DM.  - Metformin 1000 mg daily - Lantus 35 units daily - CBG checks daily  - Gabapentin 600 mg 3 times daily   CKDstage III, stable Baseline Cr 1.3-1.5. On 11/5, creatinine was elevated to 1.91 but recovered to 1.77 on 11/6 after oral hydration was encouraged.  Creatinine 1.72 on 11/7. -Encourage adequate p.o. intake -recheck creatinine in one week  Hypertension Normotensive overnight.  -Continue amlodipine 10 mg daily  Hyperlipidemia Patient recently restarted on statin for elevated lipid panel.  -continue Atorvastatin 20 mg daily  History of alcohol/polysubstance abuse, stable -Continue to monitor and encourage cessation  GERD, stable -Continue Pepcid 20 mg daily  Gout, stable Asymptomatic -Continue allopurinol 200 mg daily  Constipation Last BM 10/24, continue to encourage Miralax.  -Continue MiraLAX  BPH, stable Patient voiding, no recorded output -Continue finasteride 5 mg daily -Continue Flomax 0.4 mg dailys  FEN/GI: Heart healthy/carb modified PPx: pt encouraged to be up  and ambulating regularly   Disposition: Medically stable for discharge, awaiting placement  for ALF  Subjective:  Patient reports seeing spiders all of the time.  He understands that these are hallucinations.  He denies wanting to harm himself.  He says that he should be taking Haldol 1,000 mg daily.  Objective: Temp:  [97.5 F (36.4 C)-98.8 F (37.1 C)] 97.5 F (36.4 C) (11/06 2201) Pulse Rate:  [74-96] 96 (11/06 2201) Resp:  [18-20] 18 (11/06 2201) BP: (116-133)/(63-81) 132/63 (11/06 2201) SpO2:  [87 %-100 %] 87 % (11/06 2201) Intake/Output      11/06 0701 - 11/07 0700   P.O. 720   Other 360   Total Intake(mL/kg) 1080 (9.7)   Urine (mL/kg/hr) 0 (0)   Emesis/NG output 0   Other 0   Stool 0   Blood 0   Total Output 0   Net +1080       Urine Occurrence 851 x   Stool Occurrence 0 x   Emesis Occurrence 0 x       Physical Exam: General: NAD, resting comfortably in bed, pleasant and appropriate  HEENT: NCAT Cardiovascular: RRR, no MRG, 1+ pedal edema bilaterally Respiratory: normal WOB on RA, clear to auscultation bilaterally Psych: endorsing visual hallucinations, alert and oriented x 2  Laboratory: I have personally read and reviewed all labs and imaging studies.  CBC: No results for input(s): WBC, NEUTROABS, HGB, HCT, MCV, PLT in the last 168 hours. CMP: Recent Labs  Lab 03/20/19 1202 03/21/19 0605  NA 138 142  K 4.8 4.6  CL 109 113*  CO2 20* 22  GLUCOSE 212* 95  BUN 30* 27*  CREATININE 1.91* 1.77*  CALCIUM 9.6 9.5  ALBUMIN 3.1*  --    CBG: Recent Labs  Lab 03/18/19 0659 03/18/19 1113 03/19/19 0519 03/20/19 0717 03/21/19 0534  GLUCAP 134* 190* 123* 98 92   Micro: Covid Negative  Recent Results (from the past 240 hour(s))  SARS CORONAVIRUS 2 (TAT 6-24 HRS) Nasopharyngeal Nasopharyngeal Swab     Status: None   Collection Time: 03/17/19 12:27 PM   Specimen: Nasopharyngeal Swab  Result Value Ref Range Status   SARS Coronavirus 2 NEGATIVE NEGATIVE  Final    Comment: (NOTE) SARS-CoV-2 target nucleic acids are NOT DETECTED. The SARS-CoV-2 RNA is generally detectable in upper and lower respiratory specimens during the acute phase of infection. Negative results do not preclude SARS-CoV-2 infection, do not rule out co-infections with other pathogens, and should not be used as the sole basis for treatment or other patient management decisions. Negative results must be combined with clinical observations, patient history, and epidemiological information. The expected result is Negative. Fact Sheet for Patients: HairSlick.nohttps://www.fda.gov/media/138098/download Fact Sheet for Healthcare Providers: quierodirigir.comhttps://www.fda.gov/media/138095/download This test is not yet approved or cleared by the [place] FDA and  has been authorized for detection and/or diagnosis of SARS-CoV-2 by FDA under an Emergency Use Authorization (EUA). This EUA will remain  in effect (meaning this test can be used) for the duration of the COVID-19 declaration under Section 56 4(b)(1) of the Act, 21 U.S.C. section 360bbb-3(b)(1), unless the authorization is terminated or revoked sooner. Performed at [facility], [address] [place], [place] [address]      Imaging/Diagnostic Tests: No results found.  EKG Interpretation  Date/Time:  Thursday February 20 2019 13:14:10 EDT Ventricular Rate:  83 PR Interval:    QRS Duration: 89 QT Interval:  378 QTC Calculation: 445 R Axis:   23 Text Interpretation:  Sinus rhythm Low voltage, precordial leads Confirmed by Bethann BerkshireZammit, Joseph [phone]) on  02/20/2019 3:29:11 PM        Lorie Melichar, Alcario Drought, MD 03/22/2019, 2:11 AM PGY-3, [place] Intern pager: [phone], text pages welcome

## 2019-03-22 NOTE — Progress Notes (Signed)
Family Medicine Teaching Service Daily Progress Note Intern Pager: [phone]  Patient name: Dylan Jordan Medical record number: [id number] Date of birth: 1956-03-05 Age: 63 y.o. Gender: male  Primary Care Provider: Shirley, [place], DO Consultants: Psychiatry Code Status: Full Code   Pt Overview and Major Events to Date:  Hospital Day: 32 02/20/2019: admitted for hallucinations/psychosis, hyperkalemia 10/8-admitted for hyperkalemia, with psychosis 10/16- patient recommended for SNF by PT, patient preferred to stay hospitalized after discharge order placed with plan to go to SNF  10/20- no available [place] [facility]F, patient sister asked for preferred facilities in [place], [place] 10/21- re-evaluated by Psychiatry, no inpatient criteria, discontinued Haldol and added Olanzapine 5mg  10/23- [facility] unable to offer bed at this time, SW to continue with attempts for SNF placement 10/26- Sister notified of beds available in [facility] 10/30- Cogentin increased to 2 mg daily  10/30- Patient no longer qualifies for SNF, patient's Sister notifed, [name] began ALF process for placement 11/3 -[facility] cannot offer bed, f/u with [facility]  11/6: Reevaluated by psychiatry, no inpatient criteria, added Haldol, discontinued olanzapine, decreased Cogentin  Assessment and Plan: Dylan Jordan a 63 y.o.malepresenting with hallucinations and elevated potassium.PMH is significant for schizophrenia, type 2 diabetes, CKD, hypertension, hyperlipidemia, Hx alcoholandpolysubstance abuse.  AMS 2/2 psychiatricmedicationsschizophrenia, improved Hallucinations, continued  No acute events overnight. Patient has remained stable overnight. Denies any visual or auditory hallucinations this AM which is improved from day prior. Denies any SI/HI. He declares he is doing "a whole lot better". A&Ox1 (to self) - unclear of baseline. Family unable to provide 24-hour supervision that patient  requires, thus plan to discharge to SNF/ALF. [name] working on placement. COVID negative (11/2).   - Psych following, apprecaite recs - Continue Depakote 2.5g QD (500mg  qAM, 2000mg  qHS) - Continue Lithium 300mg  BID - Continue Cogentin 1mg  QD - Continue Haldol 5mg  BID  - Consider IM Haldol decanoate 100mg  outpatient (was due on 11/2) - PT/OT recs: fall precautions, no PT follow up - [name] working on ALF/SNF placement  - Plan to ambulate patient this AM to assess stability with meds  Anxiety, stable Currently asymptomatic.  - Psych meds as above - Contniue Hydroxyzine 25mg  q6 hours PRN - Gabapentin 600mg  TID - continue to monitor   T2DM with peripheral neuropathy, improved Currently receiving 35U Lantus QD and Metformin 1000mg  QD.  - Continue Metformin 1000mg  QD and Lantus 35U QD - CBG's daily  - continue Gabapentin 600mg  TID   CKDstage III, stable Baseline Cr 1.3-1.5. Cr 1.91>1.77>1.72 on 11/7. Improved with oral hydration. Plan to recheck recheck creatinine on 11/11. -Encourage adequate p.o. intake -recheck creatinine in one week  Hypertension Elevated BP overnight to 161/99. - Plan to get repeat BP to continue to monitor  - Continue Norvasc 10mg  QD - consider addition of 2nd agent if continues to remain elevated  Hyperlipidemia Patient recently restarted on statin for elevated lipid panel.  -continue Atorvastatin 20 mg daily  History of alcohol/polysubstance abuse, stable -Continue to monitor and encourage cessation  GERD, stable -Continue Pepcid 20 mg daily  Gout, stable Asymptomatic -Continue allopurinol 200 mg daily  Constipation -Continue MiraLAX  BPH, stable -Continue finasteride 5 mg daily -Continue Flomax 0.4 mg dailys  FEN/GI: Heart healthy/carb modified PPx: pt encouraged to be up and ambulating regularly   Disposition: Medically stable for discharge, awaiting placement for ALF  Subjective:  Patient denies any visual or auditory  hallucinations this AM. He denies any suicidal or homicidal ideations. Endorses  tolerating PO well. No acute events overnight.   Objective: Temp:  [97.5 F (36.4 C)-97.9 F (36.6 C)] 97.9 F (36.6 C) (11/07 1952) Pulse Rate:  [72-95] 95 (11/07 1952) Resp:  [18] 18 (11/07 1952) BP: (122-161)/(58-99) 161/99 (11/07 1952) SpO2:  [96 %-100 %] 98 % (11/07 1952) Intake/Output      11/07 0701 - 11/08 0700   P.O. 1200   Total Intake(mL/kg) 1200 (10.8)   Net +1200       Urine Occurrence 4 x   Stool Occurrence 0 x       Physical Exam: General: pleasant older male, well nourished, well developed, in no acute distress with non-toxic appearance, sleeping comfortably in bed upon entering room CV: regular rate and rhythm without murmurs, rubs, or gallops Lungs: clear to auscultation bilaterally with normal work of breathing Skin: warm, dry Extremities: warm and well perfused Neuro: Alert and orientedx1, speech slurred and difficult to understand  Laboratory: I have personally read and reviewed all labs and imaging studies.  CBC: No results for input(s): WBC, NEUTROABS, HGB, HCT, MCV, PLT in the last 168 hours. CMP: Recent Labs  Lab 03/20/19 1202 03/21/19 0605 03/22/19 0456  NA 138 142 140  K 4.8 4.6 4.2  CL 109 113* 110  CO2 20* 22 22  GLUCOSE 212* 95 143*  BUN 30* 27* 26*  CREATININE 1.91* 1.77* 1.72*  CALCIUM 9.6 9.5 9.2  ALBUMIN 3.1*  --   --    CBG: Recent Labs  Lab 03/18/19 0659 03/18/19 1113 03/19/19 0519 03/20/19 0717 03/21/19 0534  GLUCAP 134* 190* 123* 98 92   Micro: Covid Negative  Recent Results (from the past 240 hour(s))  SARS CORONAVIRUS 2 (TAT 6-24 HRS) Nasopharyngeal Nasopharyngeal Swab     Status: None   Collection Time: 03/17/19 12:27 PM   Specimen: Nasopharyngeal Swab  Result Value Ref Range Status   SARS Coronavirus 2 NEGATIVE NEGATIVE Final    Comment: (NOTE) SARS-CoV-2 target nucleic acids are NOT DETECTED. The SARS-CoV-2 RNA is generally  detectable in upper and lower respiratory specimens during the acute phase of infection. Negative results do not preclude SARS-CoV-2 infection, do not rule out co-infections with other pathogens, and should not be used as the sole basis for treatment or other patient management decisions. Negative results must be combined with clinical observations, patient history, and epidemiological information. The expected result is Negative. Fact Sheet for Patients: HairSlick.no Fact Sheet for Healthcare Providers: quierodirigir.com This test is not yet approved or cleared by the [place] FDA and  has been authorized for detection and/or diagnosis of SARS-CoV-2 by FDA under an Emergency Use Authorization (EUA). This EUA will remain  in effect (meaning this test can be used) for the duration of the COVID-19 declaration under Section 56 4(b)(1) of the Act, 21 U.S.C. section 360bbb-3(b)(1), unless the authorization is terminated or revoked sooner. Performed at [facility], [address] [place], [place] [address]      Imaging/Diagnostic Tests: No results found.  EKG Interpretation  Date/Time:  Thursday February 20 2019 13:14:10 EDT Ventricular Rate:  83 PR Interval:    QRS Duration: 89 QT Interval:  378 QTC Calculation: 445 R Axis:   23 Text Interpretation:  Sinus rhythm Low voltage, precordial leads Confirmed by Bethann Berkshire [phone]) on 02/20/2019 3:29:11 PM        Joana Reamer, DO 03/23/2019, 5:53 AM PGY-2, [facility] FPTS Intern pager: [phone], text pages welcome

## 2019-03-23 LAB — GLUCOSE, CAPILLARY: Glucose-Capillary: 199 mg/dL — ABNORMAL HIGH (ref 70–99)

## 2019-03-23 MED ORDER — MAGNESIUM CITRATE PO SOLN
1.0000 | Freq: Once | ORAL | Status: AC
  Administered 2019-03-23: 1 via ORAL
  Filled 2019-03-23: qty 296

## 2019-03-23 NOTE — Plan of Care (Signed)
?  Problem: Elimination: ?Goal: Will not experience complications related to urinary retention ?Outcome: Progressing ?  ?

## 2019-03-24 LAB — GLUCOSE, CAPILLARY: Glucose-Capillary: 122 mg/dL — ABNORMAL HIGH (ref 70–99)

## 2019-03-24 NOTE — TOC Progression Note (Signed)
Transition of Care [facility]) - Progression Note    Patient Details  Name: Dylan Jordan MRN: [id number] Date of Birth: 12-12-1955  Transition of Care [facility]) CM/SW Contact  Sharlet Salina Mila Homer, LCSW Phone Number: 03/24/2019, 3:13 PM  Clinical Narrative: Search continues for a skilled nursing facility for patient. Contacts today include: 1. [facility] [phone]) Don't accept Medicaid 2. [facility] [phone]) Receptionist will give [name]'s information to [facility]. When asked, [name] informed that they do accept MA. Received call from Roderic Palau at 3:44 pm and clinicals faxed [phone]). [facility] [phone]) Clinicals faxed to facility, ATT: Ms. Tana Felts [phone] phone & fax number). 4. [facility] - Spoke with Levada Dy, SNF admissions director regarding contact for ALF. She spoke with someone and [name] informed that they have 1 male ALF bed, however they are transitioning a male from rehab to this bed. [place] [facility] [phone]). Talked with Alyssa and faxed information (fax [phone]) 6. [facility] (ph# [phone]) Talked with Abigail Butts and clinicals faxed - [phone]. [facility] [phone]) - Number disconnected or no longer in service. [facility] [phone]) - Number disconnected or no longer in service.  9. Kendra Opitz [phone] - Left message 10. [facility] in [place] [phone]). [name] informed that Grace Bushy, admissions director out of the office. [name] provided name and phone # to receptionist. 11. [facility] [phone]) Left a message 12. [facility] [phone]) [facility] Dace - not available.  [name] will continue search for an ALF/Adult Care Home for patient.     Expected Discharge Plan: Assisted Living Barriers to Discharge: Inadequate or no insurance, Unsafe home situation(ALF placement (private pay))  Expected Discharge  Plan and Services Expected Discharge Plan: Assisted Living In-house Referral: Clinical Social Work Discharge Planning Services: NA Post Acute Care Choice: Nursing Home Living arrangements for the past 2 months: Apartment Expected Discharge Date: 02/28/19               DME Arranged: N/A DME Agency: NA       HH Arranged: NA           Social Determinants of Health (SDOH) Interventions  [name] working on ALF placement for patient as family does not feel patient can no longer live safely alone.  Readmission Risk Interventions No flowsheet data found.

## 2019-03-24 NOTE — NC FL2 (Addendum)
[place] LEVEL OF CARE SCREENING TOOL     IDENTIFICATION  Patient Name: Dylan Jordan Birthdate: 01-07-1956 Sex: male Admission Date (Current Location): 02/20/2019  [place] and [place] Number:  Kathleen Argue [id number] [place] and Address:  [facility] [place]. [facility], [place] [address], [place], [place] [address]      Provider Number: [id number]  Attending Physician Name and Address:  Martyn Malay, MD  Relative Name and Phone Number:  Claudina Lick - neice - [phone]    Current Level of Care: Hospital Recommended Level of Care: [place], [facility] Prior Approval Number:    Date Approved/Denied:   PASRR Number: [id number]  Discharge Plan: Domiciliary (Rest home)(Rest Home or ALF)    Current Diagnoses: Patient Active Problem List   Diagnosis Date Noted  . Hyperkalemia   . Schizoaffective disorder, bipolar type ([place]) 02/21/2019  . Hallucinations   . Anxiety 09/18/2018  . Swelling of both lower extremities 09/13/2018  . Type 2 diabetes mellitus without complication, with long-term current use of insulin ([place])   . Anemia   . Hyperlipidemia 12/18/2017  . 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 Intermittent chest pain 11/25/2017  . Allergic rhinitis with a nonallergic component 07/30/2017  . Food allergy 07/30/2017  . Chronic gout involving toe of left foot without tophus 05/18/2017  . Hemorrhoids 05/04/2017  . Haloperidol adverse reaction 04/16/2017  . Chronic low back pain 01/03/2017  . Chronic cough 06/30/2016  . Drug-induced mood disorder ([place]) 06/14/2016  . Type 2 diabetes mellitus with diabetic neuropathy ([place]) 05/26/2016  . Iron deficiency anemia 05/26/2016  . Chronic kidney disease 05/26/2016  . HTN (hypertension) 05/26/2016  . Tobacco abuse 05/26/2016  . History of substance abuse ([place]) 05/26/2016  . History of alcohol abuse 05/26/2016  . Mild persistent asthma 05/26/2016    Orientation  RESPIRATION BLADDER Height & Weight     Self, Time, Situation, Place  Normal Continent Weight: 244 lb 11.4 oz (111 kg) Height:  '6\' 1"'  (185.4 cm)  BEHAVIORAL SYMPTOMS/MOOD NEUROLOGICAL BOWEL NUTRITION STATUS      Continent(Occassional bowel incontinence) Diet(Heart healthy diet)  AMBULATORY STATUS COMMUNICATION OF NEEDS Skin   Independent Verbally(Slurred speech/Dysarthria) Skin abrasions, Other (Comment)(Skin tear right knee with foam dressing; Ecchymosis back,)                       Personal Care Assistance Level of Assistance  Bathing, Feeding, Dressing Bathing Assistance: Independent Feeding assistance: Independent Dressing Assistance: Independent Total Care Assistance: (Patient is not total care but independent with all ADL's)   Functional Limitations Info  Sight, Hearing, Speech Sight Info: Adequate Hearing Info: Adequate Speech Info: Impaired(Slurred speach - very difficult to understand)    Benjamin Perez  PT (By licensed PT), OT (By licensed OT)     PT Frequency: No further interventions - PT signed off OT Frequency: OT signed off after 10/15 evaluation            Contractures Contractures Info: Not present    Additional Factors Info  Code Status Code Status Info: Full Allergies Info: Chantix, Ativen, Shellfish derived products, Penicillins Psychotropic Info: See Med list Insulin Sliding Scale Info: insulin glargine (lantus) 35 units daily       Current Medications (03/29/2019):   Medication List    STOP taking these medications   insulin lispro 100 UNIT/ML injection Commonly known as: HUMALOG   pantoprazole 40 MG tablet Commonly known as: PROTONIX  TAKE these medications   Accu-Chek Aviva Plus w/Device Kit Use to check blood sugar three times daily or as directed.   Accu-Chek Softclix Lancets lancets 1 each by Other route 3 (three) times daily.   albuterol 108 (90 Base) MCG/ACT inhaler Commonly known as: VENTOLIN  HFA Inhale 1-2 puffs into the lungs every 6 (six) hours as needed for wheezing or shortness of breath.   allopurinol 100 MG tablet Commonly known as: ZYLOPRIM Take 2 tablets (200 mg total) by mouth daily.   amLODipine 10 MG tablet Commonly known as: NORVASC Take 10 mg by mouth daily.   atorvastatin 20 MG tablet Commonly known as: LIPITOR Take 1 tablet (20 mg total) by mouth daily at 6 PM.   benztropine 1 MG tablet Commonly known as: COGENTIN Take 1 tablet (1 mg total) by mouth daily. What changed:   medication strength  how much to take  when to take this   divalproex 500 MG DR tablet Commonly known as: DEPAKOTE Take 4 tablets (2,000 mg total) by mouth every evening. What changed: You were already taking a medication with the same name, and this prescription was added. Make sure you understand how and when to take each.   divalproex 500 MG DR tablet Commonly known as: DEPAKOTE Take 1 tablet (500 mg total) by mouth every morning. Start taking on: March 29, 2019 What changed:   how much to take  when to take this  additional instructions   famotidine 20 MG tablet Commonly known as: PEPCID Take 20 mg by mouth 2 (two) times daily.   finasteride 5 MG tablet Commonly known as: PROSCAR Take 1 tablet (5 mg total) by mouth daily. Start taking on: March 29, 2019   gabapentin 300 MG capsule Commonly known as: NEURONTIN Take 2 capsules (600 mg total) by mouth 3 (three) times daily.   glucose blood test strip Commonly known as: Accu-Chek Aviva Plus Dispense QS for twice daily testing.   haloperidol 5 MG tablet Commonly known as: HALDOL Take 1 tablet (5 mg total) by mouth at bedtime.   hydrOXYzine 25 MG tablet Commonly known as: ATARAX/VISTARIL Take 1 tablet (25 mg total) by mouth 2 (two) times daily as needed for anxiety. What changed:   medication strength  how much to take  when to take this   Lantus SoloStar 100 UNIT/ML Solostar  Pen Generic drug: Insulin Glargine Inject 35 Units into the skin daily. What changed: how much to take   lithium carbonate 150 MG capsule Take 1 capsule (150 mg total) by mouth 2 (two) times daily with a meal. What changed:   medication strength  how much to take   losartan 25 MG tablet Commonly known as: COZAAR Take 1 tablet (25 mg total) by mouth at bedtime.   metformin 1000 MG (OSM) 24 hr tablet Commonly known as: FORTAMET Take 1 tablet (1,000 mg total) by mouth daily with breakfast. Start taking on: March 29, 2019   NovoFine Plus 32G X 4 MM Misc Generic drug: Insulin Pen Needle 1 Container by Does not apply route daily. What changed: Another medication with the same name was added. Make sure you understand how and when to take each.   BD Pen Needle Nano U/F 32G X 4 MM Misc Generic drug: Insulin Pen Needle Inject 1 applicator as directed 2 (two) times daily. What changed: Another medication with the same name was added. Make sure you understand how and when to take each.   Pen Needles 31G X  8 MM Misc Use as directed What changed: You were already taking a medication with the same name, and this prescription was added. Make sure you understand how and when to take each.   polyethylene glycol 17 g packet Commonly known as: MIRALAX / GLYCOLAX Take 17 g by mouth daily as needed.   senna 8.6 MG Tabs tablet Commonly known as: SENOKOT Take 1 tablet (8.6 mg total) by mouth daily as needed for mild constipation.   tamsulosin 0.4 MG Caps capsule Commonly known as: FLOMAX Take 1 capsule (0.4 mg total) by mouth daily after breakfast. Start taking on: March 29, 2019 What changed:   how much to take  when to take this       Discharge Medications: Please see discharge summary for a list of discharge medications.  Relevant Imaging Results:  Relevant Lab Results:   Additional Information ss#[id number]. Patient followed by psychiatry early in his  admission and inpatient psych placement recommended. On 10/14 psychiatry determined that patient no longer a risk to himself or others and inpatient psych placment not needed.  Sable Feil, LCSW

## 2019-03-24 NOTE — Progress Notes (Signed)
Family Medicine Teaching Service Daily Progress Note Intern Pager: [phone]  Patient name: Dylan Jordan Medical record number: [id number] Date of birth: 01-06-1956 Age: 63 y.o. Gender: male  Primary Care Provider: Shirley, [place], DO Consultants: Psychiatry Code Status: Full Code   Pt Overview and Major Events to Date:  Hospital Day: 34 02/20/2019: admitted for hallucinations/psychosis, hyperkalemia 10/8-admitted for hyperkalemia, with psychosis 10/16- patient recommended for SNF by PT, patient preferred to stay hospitalized after discharge order placed with plan to go to SNF  10/20- no available [facility], patient sister asked for preferred facilities in [place], [place] 10/21- re-evaluated by Psychiatry, no inpatient criteria, discontinued Haldol and added Olanzapine 5mg  10/23- [facility] unable to offer bed at this time, SW to continue with attempts for SNF placement 10/26- Sister notified of beds available in [facility] 10/30- Cogentin increased to 2 mg daily  10/30- Patient no longer qualifies for SNF, patient's Sister notifed, [name] began ALF process for placement 11/3 -[facility] cannot offer bed, f/u with [facility]  11/6: Reevaluated by psychiatry, no inpatient criteria, added Haldol, discontinued olanzapine, decreased Cogentin  Assessment and Plan: Dylan Jordan a 63 y.o.malepresenting with hallucinations and elevated potassium.PMH is significant for schizophrenia, type 2 diabetes, CKD, hypertension, hyperlipidemia, Hx alcoholandpolysubstance abuse.  AMS 2/2 psychiatricmedicationsschizophrenia, improved Hallucinations, continued  No acute events overnight. Patient has remained stable overnight. Denies any visual or auditory hallucinations this AM which is improved from day prior. Denies SI/HI. A&Ox1. Disposition plan for placement at ALF. [name] working on placement. COVID negative (11/2). Pharmacy to contact psychiatry and follow up  regarding plan for IM haldol decanoate, as pt was due on 11/2. - Psych following, appreciate recs - Continue Depakote 2.5g QD (500mg  qAM, 2000mg  qHS) - Continue Lithium 300mg  BID - Continue Cogentin 1mg  QD - Continue Haldol 5mg  BID  - PT/OT recs: fall precautions, no PT follow up - [name] working on ALF/SNF placement   Anxiety, stable Currently asymptomatic.  - Psych meds as above - Contniue Hydroxyzine 25mg  q6 hours PRN - Gabapentin 600mg  TID - continue to monitor   T2DM with peripheral neuropathy, improved Glucose appropriate at 122. Currently receiving 35U Lantus QD and Metformin 1000mg  QD.  - Continue Metformin 1000mg  QD and Lantus 35U QD - CBG's daily  - continue Gabapentin 600mg  TID   CKDstage III, stable Baseline Cr 1.3-1.5. Cr 1.91>1.77>1.72 on 11/7. Improved with oral hydration. Plan to recheck recheck creatinine on 11/11. -Encourage adequate p.o. intake -recheck creatinine in one week  Hypertension Elevated BP overnight to 140/90, improvement from previous elevation - Plan to get repeat BP to continue to monitor  - Continue Norvasc 10mg  QD - consider addition of 2nd agent if continues to remain elevated  Hyperlipidemia Patient recently restarted on statin for elevated lipid panel.  -continue Atorvastatin 20 mg daily  History of alcohol/polysubstance abuse, stable -Continue to monitor and encourage cessation  GERD, stable -Continue Pepcid 20 mg daily  Gout, stable Asymptomatic -Continue allopurinol 200 mg daily  Constipation -Continue MiraLAX  BPH, stable -Continue finasteride 5 mg daily -Continue Flomax 0.4 mg dailys  FEN/GI: Heart healthy/carb modified PPx: pt encouraged to be up and ambulating regularly   Disposition: Medically stable for discharge, awaiting placement for ALF  Subjective:  Patient denies any visual or auditory hallucinations this AM. He denies any suicidal or homicidal ideations. Endorses tolerating PO well. No acute  events overnight.   Objective: Temp:  [98 F (36.7 C)-98.6 F (37 C)] 98.6 F (37 C) (  11/09 0440) Pulse Rate:  [76-87] 85 (11/09 0440) Resp:  [18] 18 (11/09 0440) BP: (122-145)/(65-94) 125/79 (11/09 0440) SpO2:  [97 %-100 %] 100 % (11/09 0440) Intake/Output      11/08 0701 - 11/09 0700   P.O. 840   Total Intake(mL/kg) 840 (7.6)   Net +840       Urine Occurrence 2 x   Stool Occurrence 3 x       Physical Exam: General: pleasant older gentleman, WN, WD, NAD, sleeping comfortably  CV: RRR, no m/r/g Lungs: CTAB with normal work of breathing Skin: warm, dry Extremities: warm and well perfused Neuro: Alert and orientedx1, speech slurred and difficult to understand  Laboratory: I have personally read and reviewed all labs and imaging studies.   CBC: No results for input(s): WBC, NEUTROABS, HGB, HCT, MCV, PLT in the last 168 hours.   CMP: Recent Labs  Lab 03/20/19 1202 03/21/19 0605 03/22/19 0456  NA 138 142 140  K 4.8 4.6 4.2  CL 109 113* 110  CO2 20* 22 22  GLUCOSE 212* 95 143*  BUN 30* 27* 26*  CREATININE 1.91* 1.77* 1.72*  CALCIUM 9.6 9.5 9.2  ALBUMIN 3.1*  --   --    CBG: Recent Labs  Lab 03/18/19 1113 03/19/19 0519 03/20/19 0717 03/21/19 0534 03/23/19 0729  GLUCAP 190* 123* 98 92 199*   Micro: Covid Negative  Recent Results (from the past 240 hour(s))  SARS CORONAVIRUS 2 (TAT 6-24 HRS) Nasopharyngeal Nasopharyngeal Swab     Status: None   Collection Time: 03/17/19 12:27 PM   Specimen: Nasopharyngeal Swab  Result Value Ref Range Status   SARS Coronavirus 2 NEGATIVE NEGATIVE Final    Comment: (NOTE) SARS-CoV-2 target nucleic acids are NOT DETECTED. The SARS-CoV-2 RNA is generally detectable in upper and lower respiratory specimens during the acute phase of infection. Negative results do not preclude SARS-CoV-2 infection, do not rule out co-infections with other pathogens, and should not be used as the sole basis for treatment or other patient  management decisions. Negative results must be combined with clinical observations, patient history, and epidemiological information. The expected result is Negative. Fact Sheet for Patients: HairSlick.no Fact Sheet for Healthcare Providers: quierodirigir.com This test is not yet approved or cleared by the [place] FDA and  has been authorized for detection and/or diagnosis of SARS-CoV-2 by FDA under an Emergency Use Authorization (EUA). This EUA will remain  in effect (meaning this test can be used) for the duration of the COVID-19 declaration under Section 56 4(b)(1) of the Act, 21 U.S.C. section 360bbb-3(b)(1), unless the authorization is terminated or revoked sooner. Performed at [facility], [address], [place], [place] [address]      Imaging/Diagnostic Tests: No results found.  EKG Interpretation  Date/Time:  Thursday February 20 2019 13:14:10 EDT Ventricular Rate:  83 PR Interval:    QRS Duration: 89 QT Interval:  378 QTC Calculation: 445 R Axis:   23 Text Interpretation:  Sinus rhythm Low voltage, precordial leads Confirmed by Bethann Berkshire [phone]) on 02/20/2019 3:29:11 PM        Shirlean Mylar, MD 03/24/2019, 6:42 AM PGY-1, [facility] Family Medicine FPTS Intern pager: [phone], text pages welcome

## 2019-03-24 NOTE — Plan of Care (Signed)
?  Problem: Education: ?Goal: Knowledge of General Education information will improve ?Description: Including pain rating scale, medication(s)/side effects and non-pharmacologic comfort measures ?Outcome: Progressing ?  ?Problem: Health Behavior/Discharge Planning: ?Goal: Ability to manage health-related needs will improve ?Outcome: Progressing ?  ?Problem: Coping: ?Goal: Level of anxiety will decrease ?Outcome: Progressing ?  ?

## 2019-03-25 ENCOUNTER — Ambulatory Visit: Payer: Medicaid Other | Admitting: Allergy & Immunology

## 2019-03-25 DIAGNOSIS — N50819 Testicular pain, unspecified: Secondary | ICD-10-CM

## 2019-03-25 LAB — GLUCOSE, CAPILLARY: Glucose-Capillary: 115 mg/dL — ABNORMAL HIGH (ref 70–99)

## 2019-03-25 MED ORDER — HALOPERIDOL 5 MG PO TABS
5.0000 mg | ORAL_TABLET | Freq: Every day | ORAL | Status: DC
  Administered 2019-03-25 – 2019-03-28 (×4): 5 mg via ORAL
  Filled 2019-03-25 (×5): qty 1

## 2019-03-25 NOTE — Plan of Care (Signed)
  Problem: Health Behavior/Discharge Planning: Goal: Ability to manage health-related needs will improve Outcome: Progressing   Problem: Coping: Goal: Level of anxiety will decrease Outcome: Progressing   

## 2019-03-25 NOTE — TOC Progression Note (Signed)
Transition of Care [facility]) - Progression Note    Patient Details  Name: Dylan Jordan MRN: [id number] Date of Birth: 1955-07-24  Transition of Care [facility]) CM/SW Contact  Bartholomew Crews, RN Phone Number: [phone] 03/25/2019, 9:45 AM  Clinical Narrative:    Received call from patient's niece, Dylan Jordan, who had received list of facilities from [employer]. Dylan Jordan asked about [facility] - advised that [facility] had been contacted and they could not offer a bed at this time. Discussed facilities contacted so far, Dylan Jordan advised that she had contacted [facility] who told her that they do not accept Medicaid. Dylan Jordan stated that she works at [employer] and that a Education officer, museum there is providing her with a list of facilities that accept psychiatric patients. TOC continuing to follow for transition needs.    Expected Discharge Plan: Assisted Living Barriers to Discharge: Inadequate or no insurance, Unsafe home situation(ALF placement (private pay))  Expected Discharge Plan and Services Expected Discharge Plan: Assisted Living In-house Referral: Clinical Social Work Discharge Planning Services: NA Post Acute Care Choice: Nursing Home Living arrangements for the past 2 months: Apartment Expected Discharge Date: 02/28/19               DME Arranged: N/A DME Agency: NA       HH Arranged: NA           Social Determinants of Health (SDOH) Interventions    Readmission Risk Interventions No flowsheet data found.

## 2019-03-25 NOTE — Progress Notes (Addendum)
FMTS Attending Daily Note: Dylan Starrarina Brown, MD  Team Pager [phone] Pager [phone]  I have seen and examined this patient, reviewed their chart. I have discussed this patient with the resident. I agree with the resident's findings, assessment and care plan.  Discussed disposition with niece, Velna HatchetSheila. Requirements to return home with home health would include no hallucinations. Family prefers ALF. Appeciate CM and LCSW work and input.     Family Medicine Teaching Service Daily Progress Note Intern Pager: [phone]  Patient name: Dylan Ameshilip Notaro Medical record number: [id number] Date of birth: Jan 06, 1956 Age: 63 y.o. Gender: male  Primary Care Provider: Shirley, [place]az[place], DO Consultants: Psychiatry Code Status: Full Code   Pt Overview and Major Events to Date:  Hospital Day: 34 02/20/2019: admitted for hallucinations/psychosis, hyperkalemia 10/8-admitted for hyperkalemia, with psychosis 10/16- patient recommended for SNF by PT, patient preferred to stay hospitalized after discharge order placed with plan to go to SNF  10/20- no available [place] SN[place], patient sister asked for preferred facilities in [place], [place] 10/21- re-evaluated by Psychiatry, no inpatient criteria, discontinued Haldol and added Olanzapine 5mg  10/23- [facility], SW to continue with attempts for SNF placement 10/26- Sister notified of beds available in [place] [facility] 10/30- Cogentin increased to 2 mg daily  10/30- Patient no longer qualifies for SNF, patient's Sister notifed, [name] began ALF process for placement 11/3 -[facility] cannot offer bed, f/u with [facility]  11/6: Reevaluated by psychiatry, no inpatient criteria, added Haldol, discontinued olanzapine, decreased Cogentin 11/10: increased Haldol PO, congentin  Assessment and Plan: Conard Novakhilip Jordan a 63 y.o.malepresenting with hallucinations and elevated potassium.PMH is significant for schizophrenia, type  2 diabetes, CKD, hypertension, hyperlipidemia, Hx alcoholandpolysubstance abuse.  AMS 2/2 psychiatricmedicationsschizophrenia, improved Hallucinations, continued  No acute events overnight. Patient has remained stable overnight. Endorses visual hallucinations of insects today. Denies auditory hallucinations. Denies SI/HI. A&Ox2. Disposition plan for placement at ALF. [name] working on placement. COVID negative (11/2). Upon chart review and in consultation with psychiatry and pharmacy, changing medication regimen below to reflect regimen patient was on earlier this summer, which he had been on for many years and quite stable. - Psych following, appreciate recs - Continue Depakote 2.5g QD (500mg  qAM, 2000mg  qHS) - Continue Lithium 300mg  BID - Continue Cogentin 1mg  QD - Decrease Haldol 5mg  qHS - Haldol decanoate 400 mg q30d (last dose 03/17/2019, next dose 04/16/2019) - PT/OT recs: fall precautions, no PT follow up - [name] working on ALF/SNF placement   Anxiety, stable Currently asymptomatic.  - Psych meds as above - Contniue Hydroxyzine 25mg  q6 hours PRN - Gabapentin 600mg  TID - continue to monitor   T2DM with peripheral neuropathy, improved Glucose appropriate at 122. Currently receiving 35U Lantus QD and Metformin 1000mg  QD.  - Continue Metformin 1000mg  QD and Lantus 35U QD - CBG's daily  - continue Gabapentin 600mg  TID   CKDstage III, stable Baseline Cr 1.3-1.5. Cr 1.91>1.77>1.72 on 11/7. Improved with oral hydration. Plan to recheck recheck creatinine on 11/11. -Encourage adequate p.o. intake -recheck creatinine in one week  Hypertension Elevated BP overnight to 139/84, improvement from previous elevation - Plan to get repeat BP to continue to monitor  - Continue Norvasc 10mg  QD - consider addition of 2nd agent if continues to remain elevated  Hyperlipidemia Patient recently restarted on statin for elevated lipid panel.  -continue Atorvastatin 20 mg daily  History  of alcohol/polysubstance abuse, stable -Continue to monitor and encourage cessation  GERD, stable -  Continue Pepcid 20 mg daily  Gout, stable Asymptomatic -Continue allopurinol 200 mg daily  Constipation -Continue MiraLAX  BPH, stable -Continue finasteride 5 mg daily -Continue Flomax 0.4 mg dailys  FEN/GI: Heart healthy/carb modified PPx: pt encouraged to be up and ambulating regularly   Disposition: Medically stable for discharge, awaiting placement for ALF  Subjective:  Patient endorses visual hallucinations of insects. AOx2. He denies any suicidal or homicidal ideations. Endorses tolerating PO well. No acute events overnight.   Objective: Temp:  [98 F (36.7 C)-98.5 F (36.9 C)] 98.5 F (36.9 C) (11/09 2126) Pulse Rate:  [76-87] 77 (11/09 2126) Resp:  [18-20] 20 (11/09 2126) BP: (122-140)/(70-90) 139/84 (11/09 2126) SpO2:  [97 %-99 %] 99 % (11/09 2126) Intake/Output      11/09 0701 - 11/10 0700   P.O. 2080   Other 240   Total Intake(mL/kg) 2320 (20.9)   Urine (mL/kg/hr) 500 (0.2)   Total Output 500   Net +1820       Urine Occurrence 1 x   Stool Occurrence 0 x   Emesis Occurrence 0 x       Physical Exam: General: pleasant older gentleman, WN, WD, NAD, awake, standing CV: RRR, no m/r/g Lungs: CTAB with normal work of breathing Skin: warm, dry Extremities: warm and well perfused Neuro: Alert and orientedx2, speech slurred and difficult to understand, EPS symptoms including difficulty opening food lids, difficulty opening soda can  Laboratory: I have personally read and reviewed all labs and imaging studies.   CBC: No results for input(s): WBC, NEUTROABS, HGB, HCT, MCV, PLT in the last 168 hours.   CMP: Recent Labs  Lab 03/20/19 1202 03/21/19 0605 03/22/19 0456  NA 138 142 140  K 4.8 4.6 4.2  CL 109 113* 110  CO2 20* 22 22  GLUCOSE 212* 95 143*  BUN 30* 27* 26*  CREATININE 1.91* 1.77* 1.72*  CALCIUM 9.6 9.5 9.2  ALBUMIN 3.1*  --   --     CBG: Recent Labs  Lab 03/19/19 0519 03/20/19 0717 03/21/19 0534 03/23/19 0729 03/24/19 0719  GLUCAP 123* 98 92 199* 122*   Micro: Covid Negative  Recent Results (from the past 240 hour(s))  SARS CORONAVIRUS 2 (TAT 6-24 HRS) Nasopharyngeal Nasopharyngeal Swab     Status: None   Collection Time: 03/17/19 12:27 PM   Specimen: Nasopharyngeal Swab  Result Value Ref Range Status   SARS Coronavirus 2 NEGATIVE NEGATIVE Final    Comment: (NOTE) SARS-CoV-2 target nucleic acids are NOT DETECTED. The SARS-CoV-2 RNA is generally detectable in upper and lower respiratory specimens during the acute phase of infection. Negative results do not preclude SARS-CoV-2 infection, do not rule out co-infections with other pathogens, and should not be used as the sole basis for treatment or other patient management decisions. Negative results must be combined with clinical observations, patient history, and epidemiological information. The expected result is Negative. Fact Sheet for Patients: HairSlick.no Fact Sheet for Healthcare Providers: quierodirigir.com This test is not yet approved or cleared by the [place] FDA and  has been authorized for detection and/or diagnosis of SARS-CoV-2 by FDA under an Emergency Use Authorization (EUA). This EUA will remain  in effect (meaning this test can be used) for the duration of the COVID-19 declaration under Section 56 4(b)(1) of the Act, 21 U.S.C. section 360bbb-3(b)(1), unless the authorization is terminated or revoked sooner. Performed at [facility], [address], [place], [place] [address]      Imaging/Diagnostic Tests: No  results found.  EKG Interpretation  Date/Time:  Thursday February 20 2019 13:14:10 EDT Ventricular Rate:  83 PR Interval:    QRS Duration: 89 QT Interval:  378 QTC Calculation: 445 R Axis:   23 Text Interpretation:  Sinus rhythm Low voltage,  precordial leads Confirmed by Milton Ferguson [phone]) on 02/20/2019 3:29:11 PM        Gladys Damme, MD 03/25/2019, 5:04 AM PGY-1, [place] Intern pager: [phone], text pages welcome

## 2019-03-25 NOTE — Progress Notes (Signed)
Physical Therapy Treatment and Discharge Patient Details Name: Dylan Jordan MRN: [id number] DOB: 1956-03-03 Today's Date: 03/25/2019    History of Present Illness Pt is a 63 y/o male admitted secondary to hallucinations and AMS likely secondary to medications. Pt also with hyperkalemia. PMH includes schizophrenia and DM.     PT Comments    Pt progressing well with mobility. Continues to report hallucinations, seeing birds throughout session. Pt becoming increasingly agitated by end of session reporting the birds are flying too close to his face. Overall pt functioning at a mod I level and no AD, and this therapist has observed the pt ambulating around the unit without staff assist since last session (1 week ago).  Attempted to discuss with pt that he has met acute PT goals and we will no longer be seeing him for therapy, but this further increased agitation. Discussed pt's progress with RN who agrees pt is safe to ambulate around unit without assistance. At this time, pt does not require further skilled physical therapy interventions at the acute care level, and we will sign off. If needs change, please reconsult.    Follow Up Recommendations  SNF vs. ALF     Equipment Recommendations  None recommended by PT    Recommendations for Other Services       Precautions / Restrictions Precautions Precautions: None Restrictions Weight Bearing Restrictions: No    Mobility  Bed Mobility               General bed mobility comments: Pt between sitting EOB and lounging back onto his elbow. Able to transition between positions without assistance or difficulty.   Transfers Overall transfer level: Modified independent Equipment used: None Transfers: Sit to/from Stand           General transfer comment: Increased time to prepare to sit to low recliner (pt is fairly tall), however no assist required. Pt stood from low bed height without difficulty.    Ambulation/Gait Ambulation/Gait assistance: Modified independent (Device/Increase time) Gait Distance (Feet): 500 Feet Assistive device: None Gait Pattern/deviations: Step-through pattern;Decreased stride length;Wide base of support;Drifts right/left Gait velocity: Decreased Gait velocity interpretation: 1.31 - 2.62 ft/sec, indicative of limited community ambulator General Gait Details: Overall steady with improved gait pattern compared to last session. Pt with good floor clearance overall, however appears antalgic at times which is likely baseline.    Stairs             Wheelchair Mobility    Modified Rankin (Stroke Patients Only)       Balance Overall balance assessment: Needs assistance Sitting-balance support: Feet supported Sitting balance-Leahy Scale: Good     Standing balance support: No upper extremity supported;During functional activity Standing balance-Leahy Scale: Fair                              Cognition Arousal/Alertness: Awake/alert Behavior During Therapy: Restless(Approaching agitation by end of session) Overall Cognitive Status: No family/caregiver present to determine baseline cognitive functioning(has a psych history)                                        Exercises      General Comments        Pertinent Vitals/Pain Pain Assessment: No/denies pain    Home Living  Prior Function            PT Goals (current goals can now be found in the care plan section) Acute Rehab PT Goals Patient Stated Goal: Stop seeing birds PT Goal Formulation: With patient Time For Goal Achievement: 03/26/19 Potential to Achieve Goals: Good Progress towards PT goals: Goals met/education completed, patient discharged from PT    Frequency    Min 1X/week      PT Plan Current plan remains appropriate    Co-evaluation              AM-PAC PT "6 Clicks" Mobility   Outcome Measure   Help needed turning from your back to your side while in a flat bed without using bedrails?: None Help needed moving from lying on your back to sitting on the side of a flat bed without using bedrails?: None Help needed moving to and from a bed to a chair (including a wheelchair)?: None Help needed standing up from a chair using your arms (e.g., wheelchair or bedside chair)?: None Help needed to walk in hospital room?: A Little Help needed climbing 3-5 steps with a railing? : A Little 6 Click Score: 22    End of Session   Activity Tolerance: Patient tolerated treatment well Patient left: in chair;with call bell/phone within reach Nurse Communication: Mobility status PT Visit Diagnosis: Unsteadiness on feet (R26.81);Muscle weakness (generalized) (M62.81)     Time: 7334-4830 PT Time Calculation (min) (ACUTE ONLY): 17 min  Charges:  $Gait Training: 8-22 mins                     Rolinda Roan, PT, DPT Acute Rehabilitation Services Pager: [phone] Office: [phone]    Thelma Comp 03/25/2019, 1:11 PM

## 2019-03-26 LAB — GLUCOSE, CAPILLARY: Glucose-Capillary: 113 mg/dL — ABNORMAL HIGH (ref 70–99)

## 2019-03-26 LAB — BASIC METABOLIC PANEL
Anion gap: 9 (ref 5–15)
BUN: 23 mg/dL (ref 8–23)
CO2: 21 mmol/L — ABNORMAL LOW (ref 22–32)
Calcium: 9.5 mg/dL (ref 8.9–10.3)
Chloride: 108 mmol/L (ref 98–111)
Creatinine, Ser: 1.59 mg/dL — ABNORMAL HIGH (ref 0.61–1.24)
GFR calc Af Amer: 53 mL/min — ABNORMAL LOW (ref >60)
GFR calc non Af Amer: 46 mL/min — ABNORMAL LOW (ref >60)
Glucose, Bld: 304 mg/dL — ABNORMAL HIGH (ref 70–99)
Potassium: 5 mmol/L (ref 3.5–5.1)
Sodium: 138 mmol/L (ref 135–145)

## 2019-03-26 LAB — SARS CORONAVIRUS 2 (TAT 6-24 HRS): SARS Coronavirus 2: NEGATIVE

## 2019-03-26 MED ORDER — TUBERCULIN PPD 5 UNIT/0.1ML ID SOLN
5.0000 [IU] | Freq: Once | INTRADERMAL | Status: AC
  Administered 2019-03-26: 5 [IU] via INTRADERMAL
  Filled 2019-03-26: qty 0.1

## 2019-03-26 NOTE — TOC Progression Note (Signed)
Transition of Care [facility]) - Progression Note    Patient Details  Name: Robyn Nohr MRN: [id number] Date of Birth: 1955-11-07  Transition of Care [facility]) CM/SW Contact  Bartholomew Crews, RN Phone Number: [phone] 03/26/2019, 10:52 AM  Clinical Narrative:    Received call from patient's niece, [name], and patient's sister, [name], to provide potential ALF contact information. Spoke with Aldona Bar, care coordinator at [facility], [phone]. Fax number provided, [phone]. Clinical and therapy notes faxed via Epic. TOC continuing to follow for transition needs.    Expected Discharge Plan: Assisted Living Barriers to Discharge: Inadequate or no insurance, Unsafe home situation(ALF placement (private pay))  Expected Discharge Plan and Services Expected Discharge Plan: Assisted Living In-house Referral: Clinical Social Work Discharge Planning Services: NA Post Acute Care Choice: Nursing Home Living arrangements for the past 2 months: Apartment Expected Discharge Date: 02/28/19               DME Arranged: N/A DME Agency: NA       HH Arranged: NA           Social Determinants of Health (SDOH) Interventions    Readmission Risk Interventions No flowsheet data found.

## 2019-03-26 NOTE — Progress Notes (Signed)
FMTS Attending Daily Note: Terisa Starr, MD  Team Pager [phone] Pager [phone]  I have seen and examined this patient, reviewed their chart. I have discussed this patient with the resident. I agree with the resident's findings, assessment and care plan.  - No changes to medications today - CM, LCSW and family working on discharge. Plan for Saturday discharge. - Labs reviewed and stable.     Family Medicine Teaching Service Daily Progress Note Intern Pager: [phone]  Patient name: Dylan Jordan Medical record number: [id number] Date of birth: 1956/02/27 Age: 63 y.o. Gender: male  Primary Care Provider: Shirley, [place], DO Consultants: Psychiatry Code Status: Full Code   Pt Overview and Major Events to Date:  Hospital Day: 35 02/20/2019: admitted for hallucinations/psychosis, hyperkalemia 10/8-admitted for hyperkalemia, with psychosis 10/16- patient recommended for SNF by PT, patient preferred to stay hospitalized after discharge order placed with plan to go to SNF  10/20- no available [place] SN[place], patient sister asked for preferred facilities in [place], [place] 10/21- re-evaluated by Psychiatry, no inpatient criteria, discontinued Haldol and added Olanzapine 5mg  10/23- [facility] unable to offer bed at this time, SW to continue with attempts for SNF placement 10/26- Sister notified of beds available in [facility] 10/30- Cogentin increased to 2 mg daily  10/30- Patient no longer qualifies for SNF, patient's Sister notifed, [name] began ALF process for placement 11/3 -[facility] cannot offer bed, f/u with [facility]  11/6: Reevaluated by psychiatry, no inpatient criteria, added Haldol, discontinued olanzapine, decreased Cogentin 11/10: decreased Haldol PO  Assessment and Plan: Dylan Jordan a 63 y.o.malepresenting with hallucinations and elevated potassium.PMH is significant for schizophrenia, type 2 diabetes, CKD, hypertension, hyperlipidemia, Hx  alcoholandpolysubstance abuse.  AMS 2/2 psychiatricmedicationsschizophrenia, improved Hallucinations, continued  No acute events overnight. Patient has remained stable overnight. Endorses visual hallucinations of animals today. Denies auditory hallucinations. Denies SI/HI. A&Ox2. Disposition plan for placement at ALF. [name] working on placement, barrier due to patient insurance medicaid. Family unable to provide appropriate level of care for patient. Family working on ALF placement as well - Psych following, appreciate recs - Continue Depakote 2.5g QD (500mg  qAM, 2000mg  qHS) - Continue Lithium 300mg  BID - Continue Cogentin 1mg  QD - Decrease Haldol 5mg  qHS - Haldol decanoate 400 mg q30d (last dose 03/17/2019, next dose 04/16/2019) - PT/OT recs: fall precautions, no PT follow up - [name] working on ALF/SNF placement   Anxiety, stable Currently asymptomatic.  - Psych meds as above - Contniue Hydroxyzine 25mg  q6 hours PRN - Gabapentin 600mg  TID - continue to monitor   T2DM with peripheral neuropathy, improved Glucose appropriate at 122. Currently receiving 35U Lantus QD and Metformin 1000mg  QD.  - Continue Metformin 1000mg  QD and Lantus 35U QD - CBG's daily  - continue Gabapentin 600mg  TID   CKDstage III, stable Baseline Cr 1.3-1.5. Cr 1.91>>1.59 today. Improved with oral hydration.  -Encourage adequate p.o. intake -recheck creatinine next week   Hypertension Elevated BP overnight to 139/84, improvement from previous elevation Currently at goal  - continue to monitor  - Continue Norvasc 10mg  QD   Hyperlipidemia Patient recently restarted on statin for elevated lipid panel.  -continue Atorvastatin 20 mg daily  History of alcohol/polysubstance abuse, stable -Continue to monitor and encourage cessation  GERD, stable -Continue Pepcid 20 mg daily  Gout, stable Asymptomatic -Continue allopurinol 200 mg daily  Constipation -Continue MiraLAX  BPH,  stable -Continue finasteride 5 mg daily -Continue Flomax 0.4 mg dailys  FEN/GI: Heart healthy/carb modified PPx:  pt encouraged to be up and ambulating regularly   Disposition: Medically stable for discharge, awaiting placement for ALF  Subjective:  Patient reports he feels well. Endorses some hallucinations of birds.   Objective: Temp:  [98.1 F (36.7 C)-98.2 F (36.8 C)] 98.2 F (36.8 C) (11/11 1732) Pulse Rate:  [57-90] 90 (11/11 1732) Resp:  [16-18] 18 (11/11 1732) BP: (120-134)/(68-77) 134/77 (11/11 1732) SpO2:  [99 %] 99 % (11/11 1732) Intake/Output      11/11 0701 - 11/12 0700   P.O. 600   Other    Total Intake(mL/kg) 600 (5.4)   Urine (mL/kg/hr)    Emesis/NG output    Stool    Total Output    Net +600       Urine Occurrence 1 x       Physical Exam: General: pleasant older gentleman, WN, WD, NAD, awake, standing CV: RRR, no m/r/g Lungs: CTAB with normal work of breathing Skin: warm, dry Extremities: warm and well perfused Neuro: Alert and orientedx2, speech slurred and difficult to understand, gait and tremor improved.   Laboratory: I have personally read and reviewed all labs and imaging studies.   CBC: No results for input(s): WBC, NEUTROABS, HGB, HCT, MCV, PLT in the last 168 hours.   CMP: Recent Labs  Lab 03/20/19 1202 03/21/19 0605 03/22/19 0456 03/26/19 0916  NA 138 142 140 138  K 4.8 4.6 4.2 5.0  CL 109 113* 110 108  CO2 20* 22 22 21*  GLUCOSE 212* 95 143* 304*  BUN 30* 27* 26* 23  CREATININE 1.91* 1.77* 1.72* 1.59*  CALCIUM 9.6 9.5 9.2 9.5  ALBUMIN 3.1*  --   --   --    CBG: Recent Labs  Lab 03/21/19 0534 03/23/19 0729 03/24/19 0719 03/25/19 0555 03/26/19 0618  GLUCAP 92 199* 122* 115* 113*   Micro: Covid Negative     Dylan Damme, MD PGY-1, [place] Intern pager: [phone], text pages welcome

## 2019-03-26 NOTE — TOC Progression Note (Addendum)
Transition of Care [facility]) - Progression Note    Patient Details  Name: Dylan Jordan MRN: [id number] Date of Birth: 1955-08-31  Transition [facility] Care [facility]) CM/SW Contact  Bartholomew Crews, RN Phone Number: [phone] 03/26/2019, 2:12 PM  Clinical Narrative:    Received call from Sharee Pimple, Development worker, international aid, at [facility]. Her number is [phone]. Medications reviewed. Sharee Pimple spoke with patient's niece, [name], concerning administrative needs. Patient is accepted. Patient will discharge on Saturday 11/14 about noon. [name] will pick him up to transport to [facility]. [name] provided with nurses station number. MD notified and advised that patient needs to have a covid test and TB test today. TOC to fill prescriptions on Friday and will store in main pharmacy. Received 2nd call from [facility] requesting fax number - she will send diet order form to be completed by MD. TOC continuing to follow for transition needs.   Update: Virtual assessment completed over Face Time with RN and patient at [facility] Demographics faxed to [facility]. Diet order sheet placed on shadow chart for MD completion. Will fax - done.   Update: Niece, [name], requesting patient state ID be retrieved from lock up. Discovered that locked belongings had been retrieved by [name], patient's sister. [name] notified and appreciated follow up.   Expected Discharge Plan: Assisted Living Barriers to Discharge: Inadequate or no insurance, Unsafe home situation(ALF placement (private pay))  Expected Discharge Plan and Services Expected Discharge Plan: Assisted Living In-house Referral: Clinical Social Work Discharge Planning Services: NA Post Acute Care Choice: Nursing Home Living arrangements for the past 2 months: Apartment Expected Discharge Date: 02/28/19               DME Arranged: N/A DME Agency: NA       HH Arranged: NA           Social Determinants of Health (SDOH) Interventions    Readmission Risk Interventions No flowsheet data found.

## 2019-03-27 LAB — GLUCOSE, CAPILLARY: Glucose-Capillary: 101 mg/dL — ABNORMAL HIGH (ref 70–99)

## 2019-03-27 LAB — LITHIUM LEVEL: Lithium Lvl: 1.17 mmol/L (ref 0.60–1.20)

## 2019-03-27 MED ORDER — LITHIUM CARBONATE 150 MG PO CAPS
150.0000 mg | ORAL_CAPSULE | Freq: Two times a day (BID) | ORAL | Status: DC
  Administered 2019-03-27 – 2019-03-29 (×4): 150 mg via ORAL
  Filled 2019-03-27 (×5): qty 1

## 2019-03-27 MED ORDER — LOSARTAN POTASSIUM 25 MG PO TABS
25.0000 mg | ORAL_TABLET | Freq: Every day | ORAL | Status: DC
  Administered 2019-03-27 – 2019-03-28 (×2): 25 mg via ORAL
  Filled 2019-03-27 (×2): qty 1

## 2019-03-27 NOTE — Progress Notes (Signed)
After starting losartan for HTN will decrease lithium to 150mg  bid due to risk of increased lithium levels. Will recheck lithium on 11/14 prior to dc to ALF.  Guadalupe Dawn MD PGY-3 Family Medicine Resident

## 2019-03-27 NOTE — Clinical Social Work Note (Signed)
[name] completed Referral Screening Verification Process form (RSVP) online via [email or web address] as patient is going to an ALF. Received a call from [name] with [facility] [phone]) regarding patient's discharge on Saturday to an [place] at [facility] in [place], [place] on Saturday, 03/29/19, transported by his niece. [name] advised of clinicals her will need: H&P, psych evals, MAR, d/c summary and per Mr. [name], he can receive this information on Monday by e-mail: [email or web address]. [name] clarified with Mr. [name] that patient is going to an ALF per families request and his agreement as he has an apartment, however family feels that patient is no longer safe at home alone.   [name] will continue to follow and talk with patient on Friday regarding contact from [facility] staff person.  Dylan Jordan, MSW, LCSW Licensed Clinical Social Worker [place] [phone]

## 2019-03-27 NOTE — TOC Progression Note (Signed)
Transition of Care [facility]) - Progression Note    Patient Details  Name: Dylan Jordan MRN: [id number] Date of Birth: March 13, 1956  Transition of Care [facility]) CM/SW Contact  Bartholomew Crews, RN Phone Number: [phone] 03/27/2019, 8:48 AM  Clinical Narrative:    Covid results negative - faxed results to [facility]. TB results pending reading Friday evening. Results will need to be faxed to [phone] to attention: Sharee Pimple. TOC following for transitions.   Expected Discharge Plan: Assisted Living Barriers to Discharge: Inadequate or no insurance, Unsafe home situation(ALF placement (private pay))  Expected Discharge Plan and Services Expected Discharge Plan: Assisted Living In-house Referral: Clinical Social Work Discharge Planning Services: NA Post Acute Care Choice: Nursing Home Living arrangements for the past 2 months: Apartment Expected Discharge Date: 02/28/19               DME Arranged: N/A DME Agency: NA       HH Arranged: NA           Social Determinants of Health (SDOH) Interventions    Readmission Risk Interventions No flowsheet data found.

## 2019-03-27 NOTE — Progress Notes (Addendum)
Family Medicine Teaching Service Daily Progress Note Intern Pager: [phone]  Patient name: Dylan Jordan Medical record number: [id number] Date of birth: 08-06-1955 Age: 63 y.o. Gender: male  Primary Care Provider: Shirley, [place], DO Consultants: Psychiatry Code Status: Full Code   Pt Overview and Major Events to Date:  Hospital Day: 36 02/20/2019: admitted for hallucinations/psychosis, hyperkalemia 10/8-admitted for hyperkalemia, with psychosis 10/16- patient recommended for SNF by PT, patient preferred to stay hospitalized after discharge order placed with plan to go to SNF  10/20- no available [facility], patient sister asked for preferred facilities in [place], [place] 10/21- re-evaluated by Psychiatry, no inpatient criteria, discontinued Haldol and added Olanzapine 5mg  10/23- [facility] unable to offer bed at this time, SW to continue with attempts for SNF placement 10/26- Sister notified of beds available in [facility] 10/30- Cogentin increased to 2 mg daily  10/30- Patient no longer qualifies for SNF, patient's Sister notifed, [name] began ALF process for placement 11/3 -[facility] cannot offer bed, f/u with [facility]  11/6: Reevaluated by psychiatry, no inpatient criteria, added Haldol, discontinued olanzapine, decreased Cogentin 11/10: decreased Haldol PO 11/11: Labs WNL, COVID-19 (-). Placed at ALF, d/c planned for 11/14  Assessment and Plan: Dylan Jordan a 63 y.o.malepresenting with hallucinations and elevated potassium.PMH is significant for schizophrenia, type 2 diabetes, CKD, hypertension, hyperlipidemia, Hx alcoholandpolysubstance abuse.  AMS 2/2 psychiatricmedicationsschizophrenia, improved Hallucinations, continued  No acute events overnight. Patient has remained stable overnight. EPS symptoms much improved with decreased tremors, able to easily put straw in drink. Endorses visual hallucinations of animals today, which is his  baseline. Denies auditory hallucinations. Denies SI/HI. A&Ox1. Patient has bed at [facility]. COVID-19 ordered and negative on 11/11, PPD administered 11/11. Lithium level today for baseline prior to secondary hypertensive agent. - Psych following, appreciate recs - Continue Depakote 2.5g QD (500mg  qAM, 2000mg  qHS) - Continue Lithium 300mg  BID - Continue Cogentin 1mg  QD - Decrease Haldol 5mg  qHS - Haldol decanoate 400 mg q30d (last dose 03/17/2019, next dose 04/16/2019) - PT/OT recs: fall precautions, no PT follow up  Anxiety, stable Currently asymptomatic.  - Psych meds as above - Contniue Hydroxyzine 25mg  q6 hours PRN - Gabapentin 600mg  TID - continue to monitor   T2DM with peripheral neuropathy, improved Glucose elevated to 304 yesterday, but had not had glargine yet. Blood sugar since then has been 101-115. Recommend considering increasing metformin to 1,500 mg qd as an outpatient. GFR >50. Currently receiving 35U Lantus QD and Metformin 1000mg  QD.  - Continue Lantus 35U QD and metformin 1000 qd - CBG's daily  - continue Gabapentin 600mg  TID   CKDstage III, stable Baseline Cr 1.3-1.5. Cr 1.91>>1.59 11/11. Improved with oral hydration.  -Encourage adequate p.o. intake, but no fluids after 8 PM due to night time micturition -recheck creatinine next week   Hypertension Elevated BP overnight to 154/80, 163/98. Will add losartan 25 mg daily for goal BP of SBP <140, DBP <90. Will obtain lithium level today, ACEi/ARBs/Thiazides can increase Li levels 1-7 weeks after initiation of medication. Recommend regular outpatient monitoring of Li levels. - continue to monitor  - Continue Norvasc 10mg  QD  Hyperlipidemia Patient recently restarted on statin for elevated lipid panel.  -continue Atorvastatin 20 mg daily  History of alcohol/polysubstance abuse, stable -Continue to monitor and encourage cessation  GERD, stable -Continue Pepcid 20 mg daily  Gout,  stable Asymptomatic -Continue allopurinol 200 mg daily  Constipation -Continue MiraLAX  BPH, stable -  Continue finasteride 5 mg daily -Continue Flomax 0.4 mg dailys  FEN/GI: Heart healthy/carb modified PPx: pt encouraged to be up and ambulating regularly   Disposition: Medically stable for discharge, awaiting placement for ALF  Subjective:  Patient reports he feels well. Endorses some hallucinations of animals, which is his baseline. He reports "sawing" in his hips that flips from L to R and back again. He has no pain in his legs. On exam ROM WNL bilaterally, some pain with R straight leg raise, likely some sciatica. Encouraged patient to continue walking in hallway for exercise. Patient and nursing report that patient frequently has early morning urinary incontinence. Patient states that this is significantly embarrassing for him, and he has happened occasionally in the past when living by himself and when living with his sister. Will place nursing communication to limit fluids after 8 PM and to encourage patient to urinate before bedtime.  Objective: Temp:  [98.1 F (36.7 C)-98.4 F (36.9 C)] 98.4 F (36.9 C) (11/12 6294) Pulse Rate:  [81-90] 90 (11/12 0613) Resp:  [18-19] 19 (11/12 0613) BP: (134-163)/(77-98) 154/80 (11/12 0613) SpO2:  [99 %] 99 % (11/12 7654) Intake/Output      11/11 0701 - 11/12 0700 11/12 0701 - 11/13 0700   P.O. 600    Other     Total Intake(mL/kg) 600 (5.4)    Urine (mL/kg/hr) 0 (0)    Emesis/NG output 0    Stool 0    Total Output 0    Net +600         Urine Occurrence 2 x    Stool Occurrence 0 x    Emesis Occurrence 0 x        Physical Exam: General: pleasant older gentleman, WN, WD, NAD, awake, standing CV: RRR, no m/r/g Lungs: CTAB with normal work of breathing Skin: warm, dry Extremities: warm and well perfused. No ecchymosis, erythema, or edema in bilateral LEs. ROM intact with normal FADIR, FABER in bilateral hips. (+) SLR on R, (-)  SLR on L. Neuro: Alert and orientedx1, speech slurred and difficult to understand, gait and tremor improved.   Laboratory: I have personally read and reviewed all labs and imaging studies.   CBC: No results for input(s): WBC, NEUTROABS, HGB, HCT, MCV, PLT in the last 168 hours.   CMP: Recent Labs  Lab 03/20/19 1202 03/21/19 0605 03/22/19 0456 03/26/19 0916  NA 138 142 140 138  K 4.8 4.6 4.2 5.0  CL 109 113* 110 108  CO2 20* 22 22 21*  GLUCOSE 212* 95 143* 304*  BUN 30* 27* 26* 23  CREATININE 1.91* 1.77* 1.72* 1.59*  CALCIUM 9.6 9.5 9.2 9.5  ALBUMIN 3.1*  --   --   --    CBG: Recent Labs  Lab 03/23/19 0729 03/24/19 0719 03/25/19 0555 03/26/19 0618 03/27/19 0617  GLUCAP 199* 122* 115* 113* 101*   Micro: Covid Negative   Shirlean Mylar, MD PGY-1, [facility] Family Medicine FPTS Intern pager: [phone], text pages welcome

## 2019-03-28 LAB — GLUCOSE, CAPILLARY
Glucose-Capillary: 246 mg/dL — ABNORMAL HIGH (ref 70–99)
Glucose-Capillary: 203 mg/dL — ABNORMAL HIGH (ref 70–99)

## 2019-03-28 MED ORDER — HALOPERIDOL 5 MG PO TABS: 5.0000 mg | ORAL_TABLET | Freq: Every day | ORAL | 0 refills | Status: DC

## 2019-03-28 MED ORDER — FINASTERIDE 5 MG PO TABS: 5.0000 mg | ORAL_TABLET | Freq: Every day | ORAL | 0 refills | Status: DC

## 2019-03-28 MED ORDER — LANTUS SOLOSTAR 100 UNIT/ML ~~LOC~~ SOPN: 35.0000 [IU] | PEN_INJECTOR | Freq: Every day | SUBCUTANEOUS | 11 refills | Status: DC

## 2019-03-28 MED ORDER — METFORMIN HCL ER (OSM) 1000 MG PO TB24: 1000.0000 mg | ORAL_TABLET | Freq: Every day | ORAL | 0 refills | Status: DC

## 2019-03-28 MED ORDER — HYDROXYZINE HCL 25 MG PO TABS: 25.0000 mg | ORAL_TABLET | Freq: Two times a day (BID) | ORAL | 0 refills | Status: DC | PRN

## 2019-03-28 MED ORDER — INSULIN GLARGINE 100 UNIT/ML ~~LOC~~ SOLN: 35.0000 [IU] | Freq: Every day | SUBCUTANEOUS | 11 refills | Status: DC

## 2019-03-28 MED ORDER — TAMSULOSIN HCL 0.4 MG PO CAPS: 0.4000 mg | ORAL_CAPSULE | Freq: Every day | ORAL | 0 refills | Status: DC

## 2019-03-28 MED ORDER — LITHIUM CARBONATE 150 MG PO CAPS: 150.0000 mg | ORAL_CAPSULE | Freq: Two times a day (BID) | ORAL | 0 refills | Status: DC

## 2019-03-28 MED ORDER — POLYETHYLENE GLYCOL 3350 17 G PO PACK: 17.0000 g | PACK | Freq: Every day | ORAL | 0 refills | Status: DC | PRN

## 2019-03-28 MED ORDER — LOSARTAN POTASSIUM 25 MG PO TABS: 25.0000 mg | ORAL_TABLET | Freq: Every day | ORAL | 0 refills | Status: DC

## 2019-03-28 MED ORDER — DIVALPROEX SODIUM 500 MG PO DR TAB: 500.0000 mg | DELAYED_RELEASE_TABLET | Freq: Every morning | ORAL | 0 refills | Status: DC

## 2019-03-28 MED ORDER — PEN NEEDLES 31G X 8 MM MISC: 11 refills | Status: DC

## 2019-03-28 MED ORDER — DIVALPROEX SODIUM 500 MG PO DR TAB: 2000.0000 mg | DELAYED_RELEASE_TABLET | Freq: Every evening | ORAL | 0 refills | Status: DC

## 2019-03-28 MED ORDER — BENZTROPINE MESYLATE 1 MG PO TABS: 1.0000 mg | ORAL_TABLET | Freq: Every day | ORAL | 0 refills | Status: DC

## 2019-03-28 MED ORDER — SENNA 8.6 MG PO TABS: 1.0000 | ORAL_TABLET | Freq: Every day | ORAL | 0 refills | Status: DC | PRN

## 2019-03-28 MED ORDER — GABAPENTIN 300 MG PO CAPS: 600.0000 mg | ORAL_CAPSULE | Freq: Three times a day (TID) | ORAL | 0 refills | Status: DC

## 2019-03-28 MED ORDER — ATORVASTATIN CALCIUM 20 MG PO TABS: 20.0000 mg | ORAL_TABLET | Freq: Every day | ORAL | 0 refills | Status: DC

## 2019-03-28 MED FILL — LITHIUM CARBONATE 150 MG CA: 150 | 30 days supply | Qty: 60 | Fill #0

## 2019-03-28 MED FILL — METFORMIN HCL ER 500 MG TB2: 500 | 30 days supply | Qty: 60 | Fill #0

## 2019-03-28 MED FILL — TAMSULOSIN HCL 0.4 MG CAP: 0.4 | 30 days supply | Qty: 30 | Fill #0

## 2019-03-28 MED FILL — HALOPERIDOL 5 MG TABS: 5 | 30 days supply | Qty: 30 | Fill #0

## 2019-03-28 MED FILL — PENTIPS 31G X 8 MM MISC: 31G X 8 MM | 30 days supply | Qty: 100 | Fill #0

## 2019-03-28 MED FILL — FINASTERIDE 5 MG TABLET: 5 | 30 days supply | Qty: 30 | Fill #0

## 2019-03-28 MED FILL — hydrOXYzine HCL 25 MG TABS: 25 | 30 days supply | Qty: 60 | Fill #0

## 2019-03-28 MED FILL — BENZTROPINE MES 1 MG TABLET: 1 | 30 days supply | Qty: 30 | Fill #0

## 2019-03-28 MED FILL — POLYETHYLENE GLYCOL 3350 PO: 17 | 14 days supply | Qty: 238 | Fill #0

## 2019-03-28 MED FILL — DIVALPROEX SOD DR 500 MG TA: 500 | 30 days supply | Qty: 150 | Fill #0

## 2019-03-28 MED FILL — LANTUS SOLOSTAR 100 UNITS/M: 100 | 34 days supply | Qty: 12 | Fill #0

## 2019-03-28 MED FILL — SENNA 8.6 MG TABS: 8.6 | 60 days supply | Qty: 60 | Fill #0

## 2019-03-28 MED FILL — GABAPENTIN 300 MG CAPSULE: 300 | 30 days supply | Qty: 180 | Fill #0

## 2019-03-28 MED FILL — LOSARTAN POTASSIUM 25 MG TA: 25 | 30 days supply | Qty: 30 | Fill #0

## 2019-03-28 MED FILL — ATORVASTATIN CALCIUM 20 MG: 20 | 30 days supply | Qty: 30 | Fill #0

## 2019-03-28 NOTE — Progress Notes (Addendum)
Family Medicine Teaching Service Daily Progress Note Intern Pager: [phone]  Patient name: Dylan Jordan Medical record number: [id number] Date of birth: 03/28/1956 Age: 63 y.o. Gender: male  Primary Care Provider: Shirley, [place], DO Consultants: Psychiatry Code Status: Full Code   Pt Overview and Major Events to Date:  Hospital Day: 37 02/20/2019: admitted for hallucinations/psychosis, hyperkalemia 10/8-admitted for hyperkalemia, with psychosis 10/16- patient recommended for SNF by PT, patient preferred to stay hospitalized after discharge order placed with plan to go to SNF  10/20- no available [place] [facility], patient sister asked for preferred facilities in [place], [place] 10/21- re-evaluated by Psychiatry, no inpatient criteria, discontinued Haldol and added Olanzapine 5mg  10/23- [facility] unable to offer bed at this time, SW to continue with attempts for SNF placement 10/26- Sister notified of beds available in [facility] 10/30- Cogentin increased to 2 mg daily  10/30- Patient no longer qualifies for SNF, patient's Sister notifed, [name] began ALF process for placement 11/3 -[facility] cannot offer bed, f/u with [facility]  11/6: Reevaluated by psychiatry, no inpatient criteria, added Haldol, discontinued olanzapine, decreased Cogentin 11/10: decreased Haldol PO 11/11: Labs WNL, COVID-19 (-). Placed at ALF, d/c planned for 11/14 11/12: Losartan started, Lithium decreased to 150mg  11/13: PPD  Assessment and Plan: Dylan Jordan a 63 y.o.malepresenting with hallucinations and elevated potassium.PMH is significant for schizophrenia, type 2 diabetes, CKD, hypertension, hyperlipidemia, Hx alcoholandpolysubstance abuse.  AMS 2/2 psychiatricmedicationsschizophrenia, improved Hallucinations, continued  No acute events overnight. Patient has remained stable overnight. EPS symptoms minimal.  No visual hallucinations yet today, although seeing animals is  his baseline. Denies auditory hallucinations. Denies SI/HI. A&Ox2. Patient has bed at [facility] at [facility]. PPD to be read today. Lithium level yesterday was therapeutic at 1.2, in consultation with Psychiatry, they recommended decreasing Lithium level by 1/2 (From 300 to 150 mg BID) in order to prevent supratherapeutic dose with Losartan. Lithium level ordered for 11/14 AM. - Psych following, appreciate recs - Continue Depakote 2.5g QD (500mg  qAM, 2000mg  qHS) - Decreased Lithium 150mg  BID - Continue Cogentin 1mg  QD - Decrease Haldol 5mg  qHS - Haldol decanoate 400 mg q30d (last dose 03/17/2019, next dose 04/16/2019) - PT/OT recs: fall precautions, no PT follow up  Anxiety, stable Currently asymptomatic.  - Psych meds as above - Contniue Hydroxyzine 25mg  q6 hours PRN - Gabapentin 600mg  TID - continue to monitor   T2DM with peripheral neuropathy, improved Glucose ranged from 101-203 in the last 24 hours. Recommend considering increasing metformin to 1,500 mg qd as an outpatient. GFR >50. Currently receiving 35U Lantus QD and Metformin 1000mg  QD.  - Continue Lantus 35U QD and metformin 1000 qd - CBG's daily  - continue Gabapentin 600mg  TID   CKDstage III, stable Baseline Cr 1.3-1.5. Cr 1.91>>1.59 11/11. Improved with oral hydration.  -Encourage adequate p.o. intake, but no fluids after 8 PM due to night time micturition -recheck creatinine next week   Hypertension BP improved to SBP 130-140s, DBP 80-90s, most recent 143/85. Losartan 25 mg daily started yesterday for goal BP of SBP <140, DBP <90. Will obtain lithium level tomorrow, ACEi/ARBs/Thiazides can increase Li levels 1-7 weeks after initiation of medication. Recommend regular outpatient monitoring of Li levels. - continue to monitor  - Continue Norvasc 10mg  QD  Hyperlipidemia Patient recently restarted on statin for elevated lipid panel.  -continue Atorvastatin 20 mg daily  History of alcohol/polysubstance  abuse, stable -Continue to monitor and encourage cessation  GERD, stable -Continue Pepcid  20 mg daily  Gout, stable Asymptomatic -Continue allopurinol 200 mg daily  Constipation -Continue MiraLAX  BPH, stable -Continue finasteride 5 mg daily -Continue Flomax 0.4 mg dailys  FEN/GI: Heart healthy/carb modified PPx: pt encouraged to be up and ambulating regularly   Disposition: Medically stable for discharge, niece [name] to pick up pt tomorrow to transport to ALF  Subjective:  Patient reports he feels well. No hallucinations of animals yet today, although that is baseline for him. Preparing for discharge tomorrow.  Objective: Temp:  [98.8 F (37.1 C)-98.9 F (37.2 C)] 98.8 F (37.1 C) (11/13 0527) Pulse Rate:  [79-90] 89 (11/13 0527) Resp:  [16-20] 18 (11/13 0527) BP: (130-155)/(83-93) 143/85 (11/13 0527) SpO2:  [99 %-100 %] 100 % (11/13 0527) Intake/Output      11/12 0701 - 11/13 0700 11/13 0701 - 11/14 0700   P.O. 1080    Total Intake(mL/kg) 1080 (9.7)    Urine (mL/kg/hr) 0 (0)    Emesis/NG output 0    Stool 0    Total Output 0    Net +1080         Urine Occurrence 4 x    Stool Occurrence 2 x    Emesis Occurrence 0 x        Physical Exam: General: pleasant older gentleman, WN, WD, NAD, sleeping, lying in bed CV: RRR, no m/r/g Lungs: CTAB with normal work of breathing Skin: warm, dry Extremities: warm and well perfused. No ecchymosis, erythema, or edema in bilateral LEs.  Neuro: Alert and orientedx2, speech slurred and difficult to understand, tremor improved.   Laboratory: I have personally read and reviewed all labs and imaging studies.   CBC: No results for input(s): WBC, NEUTROABS, HGB, HCT, MCV, PLT in the last 168 hours.   CMP: Recent Labs  Lab 03/22/19 0456 03/26/19 0916  NA 140 138  K 4.2 5.0  CL 110 108  CO2 22 21*  GLUCOSE 143* 304*  BUN 26* 23  CREATININE 1.72* 1.59*  CALCIUM 9.2 9.5   CBG: Recent Labs  Lab 03/25/19 0555  03/26/19 0618 03/27/19 0617 03/28/19 0654 03/28/19 0751  GLUCAP 115* 113* 101* 246* 203*   Micro: Covid Negative   Gladys Damme, MD PGY-1, [place] Intern pager: [phone], text pages welcome

## 2019-03-28 NOTE — TOC Progression Note (Signed)
Transition of Care [facility]) - Progression Note    Patient Details  Name: Dylan Jordan MRN: [id number] Date of Birth: [birth year]  Transition of Care [facility]) CM/SW Contact  Bartholomew Crews, RN Phone Number: [phone] 03/28/2019, 8:38 AM  Clinical Narrative:    Patient to transition to [facility] 11/14. His niece and sister will provide transportation from the hospital.   TB test was done 01/24/19 at 1714 and is due to be read after 1714 this evening. Results will need to be faxed to [facility] at [phone] to the attention - Sharee Pimple. Discharge summary will also need to be faxed to the number listed above.  Requested MD send discharge prescriptions to [place] today. Medications will be stored in main pharmacy for nurse to pick up tomorrow at discharge. TOC to contact patient's niece for copay.   Spoke with patient's sister this morning. They are prepared to transport patient tomorrow, and anticipate picking him up about noon.   TOC following for transitions.    Expected Discharge Plan: Assisted Living Barriers to Discharge: Inadequate or no insurance, Unsafe home situation(ALF placement (private pay))  Expected Discharge Plan and Services Expected Discharge Plan: Assisted Living In-house Referral: Clinical Social Work Discharge Planning Services: NA Post Acute Care Choice: Nursing Home Living arrangements for the past 2 months: Apartment Expected Discharge Date: 02/28/19               DME Arranged: N/A DME Agency: NA       HH Arranged: NA           Social Determinants of Health (SDOH) Interventions    Readmission Risk Interventions No flowsheet data found.

## 2019-03-28 NOTE — Progress Notes (Signed)
TB Skin test= negative.    Paulla Fore, RN, BSN

## 2019-03-28 NOTE — TOC Progression Note (Signed)
Transition of Care [facility]) - Progression Note    Patient Details  Name: Dylan Jordan MRN: [id number] Date of Birth: 03-01-1956  Transition of Care [facility]) CM/SW Contact  Dylan Crews, RN Phone Number: [phone] 03/28/2019, 6:12 PM  Clinical Narrative:    Nursing note with TB skin test result faxed to [facility] [phone].   Noted that medications have been filled and are stored in main pharmacy.   Family to pick up patient about noon tomorrow to transport to [facility]: [place] Dylan W. Family to call nurses station when they arrive to request patient be escorted to main entrance.    Expected Discharge Plan: Assisted Living Barriers to Discharge: Inadequate or no insurance, Unsafe home situation(ALF placement (private pay))  Expected Discharge Plan and Services Expected Discharge Plan: Assisted Living In-house Referral: Clinical Social Work Discharge Planning Services: NA Post Acute Care Choice: Nursing Home Living arrangements for the past 2 months: Apartment Expected Discharge Date: 02/28/19               DME Arranged: N/A DME Agency: NA       HH Arranged: NA           Social Determinants of Health (SDOH) Interventions    Readmission Risk Interventions No flowsheet data found.

## 2019-03-29 LAB — LITHIUM LEVEL: Lithium Lvl: 1.09 mmol/L (ref 0.60–1.20)

## 2019-03-29 LAB — GLUCOSE, CAPILLARY: Glucose-Capillary: 134 mg/dL — ABNORMAL HIGH (ref 70–99)

## 2019-03-29 NOTE — Progress Notes (Signed)
DISCHARGE NOTE Dylan Jordan to be discharged TO [place] per MD order. Discussed prescriptions and follow up appointments with the patient. Called report to the facility. Prescriptions and AVS  given to niece;    Skin clean, dry and intact without evidence of skin break down, no evidence of skin tears noted. IV catheter discontinued intact. Site without signs and symptoms of complications. Dressing and pressure applied. Pt denies pain at the site currently. No complaints noted.  Patient free of lines, drains, and wounds.   An After Visit Summary (AVS) was printed and given to the patient. Patient escorted via wheelchair, and discharged via private auto.  Paulla Fore, RN, BSN

## 2019-03-29 NOTE — TOC Transition Note (Signed)
Transition of Care [facility]) - CM/SW Discharge Note   Patient Details  Name: Dylan Jordan MRN: [id number] Date of Birth: [birth year]  Transition of Care [facility]) CM/SW Contact:  Gelene Mink, [place] Phone Number: 03/29/2019, 11:34 AM   Clinical Narrative:     Patient will DC to: [place] date: 03/29/2019 Family notified: Yes Transport by: Family   Per MD patient ready for DC to . RN, patient, patient's family, and facility notified of DC. Discharge Summary and FL2 sent to facility. RN to call report prior to discharge [phone]). DC packet on chart.  [name] will sign off for now as social work intervention is no longer needed. Please consult [place] again if new needs arise.  Nigel Ericsson, LCSW-A [place]/Clinical Social Work Department Cell: [phone]   Final next level of care: Assisted Living Barriers to Discharge: No Barriers Identified   Patient Goals and CMS Choice Patient states their goals for this hospitalization and ongoing recovery are:: Pt will transition to assisted living CMS Medicare.gov Compare Post Acute Care list provided to:: Patient Represenative (must comment) Choice offered to / list presented to : Sibling  Discharge Placement   Existing PASRR number confirmed : 03/24/19          Patient chooses bed at: Other - please specify in the comment section below:([facility]) Patient to be transferred to facility by: [place] Name of family member notified: [name] Patient and family notified of of transfer: 03/29/19  Discharge Plan and Services In-house Referral: Clinical Social Work Discharge Planning Services: NA Post Acute Care Choice: Nursing Home          DME Arranged: N/A DME Agency: NA       HH Arranged: NA HH Agency: NA        Social Determinants of Health (SDOH) Interventions     Readmission Risk Interventions No flowsheet data found.

## 2019-03-29 NOTE — Progress Notes (Signed)
Family Medicine Teaching Service Daily Progress Note Intern Pager: [phone]  Patient name: Dylan Jordan Medical record number: [id number] Date of birth: [birth year] Age: 63 y.o. Gender: male  Primary Care Provider: Shirley, [place], DO Consultants: Psychiatry Code Status: Full Code   Pt Overview and Major Events to Date:  Hospital Day: 38 02/20/2019: admitted for hallucinations/psychosis, hyperkalemia 10/8-admitted for hyperkalemia, with psychosis 10/16- patient recommended for SNF by PT, patient preferred to stay hospitalized after discharge order placed with plan to go to SNF  10/20- no available [place] SNF, patient sister asked for preferred facilities in [place], [place] 10/21- re-evaluated by Psychiatry, no inpatient criteria, discontinued Haldol and added Olanzapine 5mg  10/23- [facility] unable to offer bed at this time, SW to continue with attempts for SNF placement 10/26- Sister notified of beds available in [facility] 10/30- Cogentin increased to 2 mg daily  10/30- Patient no longer qualifies for SNF, patient's Sister notifed, [name] began ALF process for placement 11/3 -[facility] cannot offer bed, f/u with [facility]  11/6: Reevaluated by psychiatry, no inpatient criteria, added Haldol, discontinued olanzapine, decreased Cogentin 11/10: decreased Haldol PO 11/11: Labs WNL, COVID-19 (-). Placed at ALF, d/c planned for 11/14 11/12: Losartan started, Lithium decreased to 150mg  11/13: PPD neg, received discharge medications from [facility] care pharmacy 11/14: Lithium level within normal limits after adding losartan, discharged in care of niece to be transported to ALF  Assessment and Plan: Dylan Jordan a 63 y.o.malepresenting with hallucinations and elevated potassium.PMH is significant for schizophrenia, type 2 diabetes, CKD, hypertension, hyperlipidemia, Hx alcoholandpolysubstance abuse.  AMS 2/2 psychiatricmedicationsschizophrenia,  improved Hallucinations, continued  No acute events overnight. Patient continues to have baseline visual hallucinations but no auditory hallucinations, and he is not distressed by them.  Denies SI/HI. Patient has bed at [facility] and will be going there today after his niece picks him up. PPD negative on 11/13.  Lithium level 11/12 was therapeutic at 1.2. In consultation with Psychiatry, they recommended decreasing Lithium level by 1/2 (From 300 to 150 mg BID) in order to prevent supratherapeutic dose with Losartan. Lithium level 1.09 on 11/14. - Psych following, appreciate recs - Continue Depakote 2.5g QD (500mg  qAM, 2000mg  qHS) - Decreased Lithium 150mg  BID - Continue Cogentin 1mg  QD - Continue Haldol 5mg  qHS - Haldol decanoate 400 mg q30d (last dose 03/17/2019, next dose 04/16/2019) - PT/OT recs: fall precautions, no PT follow up  Anxiety, stable Currently asymptomatic.  - Psych meds as above - Contniue Hydroxyzine 25mg  q6 hours PRN - Gabapentin 600mg  TID - continue to monitor   T2DM with peripheral neuropathy, improved Glucose continued to be well controlled on 35 units Lantus over the past 24 hours.  Recommend considering increasing metformin to 1,500 mg qd as an outpatient. GFR >50. Currently receiving 35U Lantus QD and Metformin 1000mg  QD.  - Continue Lantus 35U QD and metformin 1000 qd - CBG's daily  - continue Gabapentin 600mg  TID   CKDstage III, stable Baseline Cr 1.3-1.5. Cr 1.91>>1.59 11/11. Improved with oral hydration.  -Encourage adequate p.o. intake, but no fluids after 8 PM due to night time micturition -recheck creatinine next week   Hypertension Most recent 162/93, however other measurements within normal limits over the past 24 hours. Losartan 25 mg daily started on 11/12 for goal BP of SBP <140, DBP <90. Obtained lithium level again on 11/14 since ACEi/ARBs/Thiazides can increase Li levels 1-7 weeks after initiation of medication.  Recommend regular outpatient  monitoring of Li levels. - continue to monitor  - Continue Norvasc 10mg  QD - Continue losartan 25 mg daily  Hyperlipidemia Patient recently restarted on statin for elevated lipid panel.  -continue Atorvastatin 20 mg daily  History of alcohol/polysubstance abuse, stable -Continue to monitor and encourage cessation  GERD, stable -Continue Pepcid 20 mg daily  Gout, stable Asymptomatic -Continue allopurinol 200 mg daily  Constipation -Continue MiraLAX  BPH, stable -Continue finasteride 5 mg daily -Continue Flomax 0.4 mg dailys  FEN/GI: Heart healthy/carb modified PPx: pt encouraged to be up and ambulating regularly   Disposition: Medically stable for discharge, niece [name] to pick up pt today to transport to ALF  Subjective:  Patient reports he feels well and is looking forward to leaving the hospital.    Objective: Temp:  [98.1 F (36.7 C)-98.8 F (37.1 C)] 98.4 F (36.9 C) (11/13 2033) Pulse Rate:  [81-97] 97 (11/13 2033) Resp:  [18] 18 (11/13 2033) BP: (127-162)/(75-93) 162/93 (11/13 2033) SpO2:  [97 %-100 %] 97 % (11/13 2033) Intake/Output      11/13 0701 - 11/14 0700   P.O. 860   Total Intake(mL/kg) 860 (7.7)   Urine (mL/kg/hr) 0 (0)   Emesis/NG output 0   Other 0   Stool 0   Blood 0   Total Output 0   Net +860       Urine Occurrence 4 x   Stool Occurrence 1 x   Emesis Occurrence 0 x       Physical Exam: General: pleasant gentleman able to hold a conversation, sitting comfortably in bedside chair CV: RRR, no MRG Lungs: CTAB with normal WOB on room air Extremities: warm and well perfused, moves all extremities spontaneously Neuro: alert and oriented x 2, no focal deficits  Laboratory: I have personally read and reviewed all labs and imaging studies.   CBC: No results for input(s): WBC, NEUTROABS, HGB, HCT, MCV, PLT in the last 168 hours.   CMP: Recent Labs  Lab 03/22/19 0456 03/26/19 0916  NA 140 138   K 4.2 5.0  CL 110 108  CO2 22 21*  GLUCOSE 143* 304*  BUN 26* 23  CREATININE 1.72* 1.59*  CALCIUM 9.2 9.5   CBG: Recent Labs  Lab 03/25/19 0555 03/26/19 0618 03/27/19 0617 03/28/19 0654 03/28/19 0751  GLUCAP 115* 113* 101* 246* 203*   Micro: Covid Negative   Zykeria Laguardia C. Shan Levans, MD PGY-3, [facility] 03/29/2019 2:20 AM

## 2019-03-29 NOTE — Progress Notes (Addendum)
If needed Please page [phone] to contact family medicine service in regards to this patient  Dylan Monforte MD PGY-3 Family Medicine Resident 

## 2019-03-29 NOTE — Progress Notes (Signed)
Called niece, Adela Lank, no response but left a voicemail stating that pt was to DC today and this RN wanted to set up at time for the DC.    Paulla Fore, RN, BSN

## 2019-04-05 NOTE — Progress Notes (Signed)
In response to coding query, the patient has ckd stage IIIa based on GFR of 47 and 48 at last two bmps prior to discharge   Guadalupe Dawn MD PGY-3 Family Medicine Resident

## 2019-07-10 ENCOUNTER — Other Ambulatory Visit: Payer: Self-pay

## 2019-07-14 ENCOUNTER — Telehealth: Payer: Self-pay | Admitting: *Deleted

## 2019-07-14 MED ORDER — FERROUS SULFATE 325 (65 FE) MG PO TBEC: 325.0000 mg | DELAYED_RELEASE_TABLET | ORAL | 0 refills | Status: DC

## 2019-07-14 NOTE — Telephone Encounter (Signed)
Fax received from pharmacy stating that patient is asking for a refill on his ferrous sulfate.  It is not on his current medication list.  Will forward to MD to advise.   Shi Grose,CMA

## 2020-02-11 ENCOUNTER — Other Ambulatory Visit: Payer: Self-pay

## 2020-02-11 ENCOUNTER — Emergency Department
Admission: EM | Admit: 2020-02-11 | Discharge: 2020-02-12 | Disposition: A | Discharge status: Home / Self Care | Payer: Medicaid Other | Attending: Student in an Organized Health Care Education/Training Program | Admitting: Student in an Organized Health Care Education/Training Program

## 2020-02-11 ENCOUNTER — Encounter: Payer: Self-pay | Admitting: Emergency Medicine

## 2020-02-11 DIAGNOSIS — Z20822 Contact with and (suspected) exposure to covid-19: Secondary | ICD-10-CM | POA: Insufficient documentation

## 2020-02-11 DIAGNOSIS — N189 Chronic kidney disease, unspecified: Secondary | ICD-10-CM | POA: Diagnosis not present

## 2020-02-11 DIAGNOSIS — Z7984 Long term (current) use of oral hypoglycemic drugs: Secondary | ICD-10-CM | POA: Insufficient documentation

## 2020-02-11 DIAGNOSIS — Z96652 Presence of left artificial knee joint: Secondary | ICD-10-CM | POA: Diagnosis not present

## 2020-02-11 DIAGNOSIS — R456 Violent behavior: Secondary | ICD-10-CM | POA: Diagnosis present

## 2020-02-11 DIAGNOSIS — I129 Hypertensive chronic kidney disease with stage 1 through stage 4 chronic kidney disease, or unspecified chronic kidney disease: Secondary | ICD-10-CM | POA: Diagnosis not present

## 2020-02-11 DIAGNOSIS — J45909 Unspecified asthma, uncomplicated: Secondary | ICD-10-CM | POA: Diagnosis not present

## 2020-02-11 DIAGNOSIS — E1140 Type 2 diabetes mellitus with diabetic neuropathy, unspecified: Secondary | ICD-10-CM | POA: Insufficient documentation

## 2020-02-11 DIAGNOSIS — R451 Restlessness and agitation: Secondary | ICD-10-CM | POA: Diagnosis not present

## 2020-02-11 DIAGNOSIS — F17210 Nicotine dependence, cigarettes, uncomplicated: Secondary | ICD-10-CM | POA: Insufficient documentation

## 2020-02-11 DIAGNOSIS — Z79899 Other long term (current) drug therapy: Secondary | ICD-10-CM | POA: Diagnosis not present

## 2020-02-11 LAB — COMPREHENSIVE METABOLIC PANEL
ALT: 28 U/L (ref 0–44)
AST: 24 U/L (ref 15–41)
Albumin: 3.5 g/dL (ref 3.5–5.0)
Alkaline Phosphatase: 44 U/L (ref 38–126)
Anion gap: 9 (ref 5–15)
BUN: 16 mg/dL (ref 8–23)
CO2: 23 mmol/L (ref 22–32)
Calcium: 9.3 mg/dL (ref 8.9–10.3)
Chloride: 104 mmol/L (ref 98–111)
Creatinine, Ser: 1.31 mg/dL — ABNORMAL HIGH (ref 0.61–1.24)
GFR calc Af Amer: >60 mL/min (ref >60)
GFR calc non Af Amer: 58 mL/min — ABNORMAL LOW (ref >60)
Glucose, Bld: 142 mg/dL — ABNORMAL HIGH (ref 70–99)
Potassium: 4.4 mmol/L (ref 3.5–5.1)
Sodium: 136 mmol/L (ref 135–145)
Total Bilirubin: 0.7 mg/dL (ref 0.3–1.2)
Total Protein: 7.7 g/dL (ref 6.5–8.1)

## 2020-02-11 LAB — CBC
HCT: 35.1 % — ABNORMAL LOW (ref 39.0–52.0)
Hemoglobin: 11.5 g/dL — ABNORMAL LOW (ref 13.0–17.0)
MCH: 27.7 pg (ref 26.0–34.0)
MCHC: 32.8 g/dL (ref 30.0–36.0)
MCV: 84.6 fL (ref 80.0–100.0)
Platelets: 278 10*3/uL (ref 150–400)
RBC: 4.15 MIL/uL — ABNORMAL LOW (ref 4.22–5.81)
RDW: 16.4 % — ABNORMAL HIGH (ref 11.5–15.5)
WBC: 6.7 10*3/uL (ref 4.0–10.5)
nRBC: 0 % (ref 0.0–0.2)

## 2020-02-11 LAB — RESPIRATORY PANEL BY RT PCR (FLU A&B, COVID)
Influenza A by PCR: NEGATIVE
Influenza B by PCR: NEGATIVE
SARS Coronavirus 2 by RT PCR: NEGATIVE

## 2020-02-11 LAB — ACETAMINOPHEN LEVEL: Acetaminophen (Tylenol), Serum: <10 ug/mL — ABNORMAL LOW (ref 10–30)

## 2020-02-11 LAB — VALPROIC ACID LEVEL: Valproic Acid Lvl: 60 ug/mL (ref 50.0–100.0)

## 2020-02-11 LAB — LITHIUM LEVEL: Lithium Lvl: 0.53 mmol/L — ABNORMAL LOW (ref 0.60–1.20)

## 2020-02-11 LAB — ETHANOL: Alcohol, Ethyl (B): <10 mg/dL (ref <10)

## 2020-02-11 LAB — GLUCOSE, CAPILLARY: Glucose-Capillary: 228 mg/dL — ABNORMAL HIGH (ref 70–99)

## 2020-02-11 LAB — SALICYLATE LEVEL: Salicylate Lvl: <7 mg/dL — ABNORMAL LOW (ref 7.0–30.0)

## 2020-02-11 MED ORDER — LITHIUM CARBONATE 300 MG PO CAPS
450.0000 mg | ORAL_CAPSULE | Freq: Every day | ORAL | Status: DC
  Administered 2020-02-11: 450 mg via ORAL
  Filled 2020-02-11 (×2): qty 1

## 2020-02-11 MED ORDER — DIVALPROEX SODIUM 500 MG PO DR TAB
2000.0000 mg | DELAYED_RELEASE_TABLET | Freq: Every day | ORAL | Status: DC
  Administered 2020-02-11: 2000 mg via ORAL
  Filled 2020-02-11: qty 4

## 2020-02-11 MED ORDER — DESMOPRESSIN ACETATE 0.2 MG PO TABS
0.2000 mg | ORAL_TABLET | Freq: Every day | ORAL | Status: DC
  Administered 2020-02-11: 0.2 mg via ORAL
  Filled 2020-02-11 (×2): qty 1

## 2020-02-11 MED ORDER — GABAPENTIN 400 MG PO CAPS
400.0000 mg | ORAL_CAPSULE | Freq: Three times a day (TID) | ORAL | Status: DC
  Administered 2020-02-11: 400 mg via ORAL
  Filled 2020-02-11 (×4): qty 1

## 2020-02-11 MED ORDER — SODIUM ZIRCONIUM CYCLOSILICATE 10 G PO PACK
10.0000 g | PACK | Freq: Every day | ORAL | Status: DC
  Filled 2020-02-11: qty 1

## 2020-02-11 MED ORDER — POLYETHYLENE GLYCOL 3350 17 G PO PACK: 17.0000 g | PACK | Freq: Every day | ORAL | Status: DC

## 2020-02-11 MED ORDER — FERROUS SULFATE 325 (65 FE) MG PO TABS
325.0000 mg | ORAL_TABLET | Freq: Every day | ORAL | Status: DC
  Filled 2020-02-11: qty 1

## 2020-02-11 MED ORDER — PANTOPRAZOLE SODIUM 40 MG PO TBEC: 40.0000 mg | DELAYED_RELEASE_TABLET | Freq: Every day | ORAL | Status: DC

## 2020-02-11 MED ORDER — AMLODIPINE BESYLATE 5 MG PO TABS: 10.0000 mg | ORAL_TABLET | Freq: Once | ORAL | Status: DC

## 2020-02-11 MED ORDER — INSULIN GLARGINE 100 UNIT/ML ~~LOC~~ SOLN
15.0000 [IU] | Freq: Every day | SUBCUTANEOUS | Status: DC
  Administered 2020-02-11: 15 [IU] via SUBCUTANEOUS
  Filled 2020-02-11 (×2): qty 0.15

## 2020-02-11 MED ORDER — MELATONIN 5 MG PO TABS
5.0000 mg | ORAL_TABLET | Freq: Every day | ORAL | Status: DC
  Administered 2020-02-11: 5 mg via ORAL
  Filled 2020-02-11 (×2): qty 1

## 2020-02-11 MED ORDER — OLANZAPINE 10 MG PO TABS
10.0000 mg | ORAL_TABLET | Freq: Every day | ORAL | Status: DC
  Administered 2020-02-11: 10 mg via ORAL
  Filled 2020-02-11: qty 1

## 2020-02-11 MED ORDER — IRBESARTAN 75 MG PO TABS
37.5000 mg | ORAL_TABLET | Freq: Every day | ORAL | Status: DC
  Filled 2020-02-11: qty 0.5

## 2020-02-11 MED ORDER — BENZTROPINE MESYLATE 1 MG PO TABS
3.0000 mg | ORAL_TABLET | Freq: Two times a day (BID) | ORAL | Status: DC
  Administered 2020-02-11: 3 mg via ORAL
  Filled 2020-02-11: qty 3

## 2020-02-11 MED ORDER — DIVALPROEX SODIUM 500 MG PO DR TAB: 500.0000 mg | DELAYED_RELEASE_TABLET | Freq: Every morning | ORAL | Status: DC

## 2020-02-11 NOTE — BH Assessment (Signed)
Assessment Note  Dylan Jordan is a 64 y.o. male who presents to [facility] ED involuntarily for treatment. Per triage note, Pt in via BPD from group home. Pt is schizophrenic and has been placed under IVC for aggressive behavior.  During TTS assessment pt presents the patient is alert, verbally aggressive, tangential, irritable, uncooperative, and mood-congruent with affect. The patient does not appear to be responding to internal or external stimuli. Pt is presenting with some delusional thinking stating "Malcom X and Babs Bertin died because of the dream I had". Pt denies the information provided to triage RN and reported in the IVC. Pt loudly states 'I was minding my own business when the police picked me up and brought me here". Pt grew more irritable and began demanding Haldol. Pt identified his main complaint to be the need for new housing and obtain his medications. Pt reports medication compliance. At this time it is currently unclear why pt is requesting to obtain his medications.  Pt was unclear about his current living situation and vaguely mentioned ACT services. In attempt to explore pt's housing and current services provided pt states "No I let go of ACT this morning". Pt grew increasingly agitated and asked to be left alone. Due to pt's current disorganized thoughts, paranoia and aggressive outbursts he was unable to complete the assessment.    Per Dr. Smith Robert pt meets criteria for INPT  Diagnosis: per hx paranoid schizophrenia   Past Medical History:  Past Medical History:  Diagnosis Date  . Anemia   . Asthma   . Diabetes mellitus without complication (HCC)   . Gout   . Hyperlipidemia   . Hyperosmolar hyperglycemic coma due to diabetes mellitus without ketoacidosis (HCC)   . Hypertension   . Schizophrenia (HCC) 07/06/2018    Past Surgical History:  Procedure Laterality Date  . REPLACEMENT TOTAL KNEE Left   . TOE SURGERY      Family History:  Family History  Adopted: Yes   Problem Relation Age of Onset  . Allergic rhinitis Neg Hx   . Angioedema Neg Hx   . Asthma Neg Hx   . Atopy Neg Hx   . Eczema Neg Hx   . Immunodeficiency Neg Hx   . Urticaria Neg Hx     Social History:  reports that he has been smoking cigarettes. He started smoking about 48 years ago. He has a 13.50 pack-year smoking history. He has never used smokeless tobacco. He reports that he does not drink alcohol and does not use drugs.  Additional Social History:  Alcohol / Drug Use Pain Medications: See mar Prescriptions: see mar Over the Counter: see mar History of alcohol / drug use?: No history of alcohol / drug abuse  CIWA: CIWA-Ar BP: 130/81 Pulse Rate: 98 COWS:    Allergies:  Allergies  Allergen Reactions  . Chantix [Varenicline Tartrate] Other (See Comments)    Nightmares  . Ativan [Lorazepam] Other (See Comments)    Agitation (and "5 others like it") Will need to call [facility] (phone) [phone] and [phone] to fax a release-   . Shellfish-Derived Products Itching  . Penicillins Diarrhea    Has patient had a PCN reaction causing immediate rash, facial/tongue/throat swelling, SOB or lightheadedness with hypotension: No Has patient had a PCN reaction causing severe rash involving mucus membranes or skin necrosis: No Has patient had a PCN reaction that required hospitalization: No Has patient had a PCN reaction occurring within the last 10 years:  No If all of the above answers are "NO", then may proceed with Cephalosporin use.     Home Medications: (Not in a hospital admission)   OB/GYN Status:  No LMP for male patient.  General Assessment Data Location of Assessment: [facility] ED TTS Assessment: In system Is this a Tele or Face-to-Face Assessment?: Face-to-Face Is this an Initial Assessment or a Re-assessment for this encounter?: Initial Assessment Patient Accompanied by:: N/A Language Other than English: No Living Arrangements: Other  (Comment) [facility] ) What gender do you identify as?: Male Date Telepsych consult ordered in [facility]: 02/11/20 Time Telepsych consult ordered in [facility]: 1308 Marital status: Single Maiden name: n/a Pregnancy Status: No Living Arrangements: Other (Comment) (Assisted Living ) Can pt return to current living arrangement?: Yes Admission Status: Involuntary Petitioner: Other Is patient capable of signing voluntary admission?: No Referral Source: Other Insurance type: Medicaid      Crisis Care Plan Living Arrangements: Other (Comment) (Assisted Living ) Legal Guardian:  (self) Name of Psychiatrist: None reported  Name of Therapist: None reported   Education Status Is patient currently in school?: No Is the patient employed, unemployed or receiving disability?: Receiving disability income  Risk to self with the past 6 months Suicidal Ideation: No Has patient been a risk to self within the past 6 months prior to admission? : No Suicidal Intent: No Has patient had any suicidal intent within the past 6 months prior to admission? : No Is patient at risk for suicide?: No Suicidal Plan?: No Has patient had any suicidal plan within the past 6 months prior to admission? : No Access to Means: No What has been your use of drugs/alcohol within the last 12 months?: None reported  Previous Attempts/Gestures: No How many times?: 0 Other Self Harm Risks: None reported  Triggers for Past Attempts: None known Intentional Self Injurious Behavior: None Family Suicide History: No Recent stressful life event(s): Conflict (Comment) (Assisted living home ) Persecutory voices/beliefs?: No Depression: Yes Depression Symptoms: Feeling angry/irritable Substance abuse history and/or treatment for substance abuse?: No Suicide prevention information given to non-admitted patients: Not applicable  Risk to Others within the past 6 months Homicidal Ideation: No Does patient have any  lifetime risk of violence toward others beyond the six months prior to admission? : No Thoughts of Harm to Others: No Current Homicidal Intent: No Current Homicidal Plan: No Access to Homicidal Means: No Identified Victim: n/a History of harm to others?: No Assessment of Violence: On admission Violent Behavior Description: verbal aggression  Does patient have access to weapons?: No Criminal Charges Pending?: No Does patient have a court date: No Is patient on probation?: No  Psychosis Hallucinations: None noted Delusions: Unspecified  Mental Status Report Appearance/Hygiene: In scrubs Eye Contact: Poor Motor Activity: Freedom of movement, Agitation Speech: Pressured, Tangential, Argumentative Level of Consciousness: Alert, Irritable Mood: Depressed, Irritable Affect: Depressed, Irritable Anxiety Level: Minimal Thought Processes: Tangential Judgement: Partial Orientation: Appropriate for developmental age Obsessive Compulsive Thoughts/Behaviors: None  Cognitive Functioning Concentration: Good Memory: Recent Intact, Remote Intact Is patient IDD: No Insight: Fair Impulse Control: Poor Appetite: Good Have you had any weight changes? : No Change Sleep: No Change Total Hours of Sleep: 8 Vegetative Symptoms: None  ADLScreening [facility] Assessment Services) Patient's cognitive ability adequate to safely complete daily activities?: Yes Patient able to express need for assistance with ADLs?: Yes Independently performs ADLs?: Yes (appropriate for developmental age)  Prior Inpatient Therapy Prior Inpatient Therapy: Yes Prior Therapy Dates: 01/03/19 Prior Therapy  Facilty/Provider(s): [facility] Reason for Treatment: MH  Prior Outpatient Therapy Prior Outpatient Therapy: No Does patient have an ACCT team?: Unknown Does patient have Intensive In-House Services?  : No Does patient have [name] services? : Unknown Does patient have P4CC services?: Unknown  ADL Screening (condition  at time of admission) Patient's cognitive ability adequate to safely complete daily activities?: Yes Is the patient deaf or have difficulty hearing?: No Does the patient have difficulty seeing, even when wearing glasses/contacts?: No Does the patient have difficulty concentrating, remembering, or making decisions?: No Patient able to express need for assistance with ADLs?: Yes Does the patient have difficulty dressing or bathing?: No Independently performs ADLs?: Yes (appropriate for developmental age) Does the patient have difficulty walking or climbing stairs?: No Weakness of Legs: None Weakness of Arms/Hands: None  Home Assistive Devices/Equipment Home Assistive Devices/Equipment: None  Therapy Consults (therapy consults require a physician order) PT Evaluation Needed: No OT Evalulation Needed: No SLP Evaluation Needed: No Abuse/Neglect Assessment (Assessment to be complete while patient is alone) Abuse/Neglect Assessment Can Be Completed: Yes Physical Abuse: Denies Verbal Abuse: Denies Sexual Abuse: Denies Exploitation of patient/patient's resources: Denies Self-Neglect: Denies Values / Beliefs Cultural Requests During Hospitalization: None Spiritual Requests During Hospitalization: None Consults Spiritual Care Consult Needed: No Transition of Care Team Consult Needed: No            Disposition:  Disposition Initial Assessment Completed for this Encounter: Yes Patient referred to: Other (Comment)  On Site Evaluation by:   Reviewed with Physician:    Opal Sidles 02/11/2020 4:19 PM

## 2020-02-11 NOTE — ED Triage Notes (Signed)
Pt in via BPD from group home. Pt is schizophrenic and has been placed under IVC for aggressive behavior

## 2020-02-11 NOTE — BH Assessment (Addendum)
Referral information for Psychiatric Hospitalization faxed to;   [facility] [place] [place] [phone]), 4:31 Patient under review   [facility] [phone] -[phone]) [phone] admissions staff available until the morning    . [place] ([phone]---[phone]), 3:22 AM [place] requested a urinalysis as pt is under review. Faxed to [phone] at 3:50 AM.  . [place] [phone]), 4:22 AM Per staff, lobby is currently full and they haven't hada chance to review referrals. Agreed to follow up once pt is reviewed.  [facility] C[facility] [phone] -or- [phone]), Per Leighton Parody, original fax wasn't received and requested refax. Task completed at 4:04AM. -4:36 AM pt under review.   [place] [phone]), No intake staff until the morning  . [facility] [phone] or [phone]) Per Staff pt denied due to financial   . Sandre Kitty [phone] or [phone]), No answer  . Turner Daniels [phone]).4:23 AM Left message

## 2020-02-11 NOTE — ED Notes (Signed)
Pt requested and provided with a sprite

## 2020-02-11 NOTE — ED Notes (Signed)
Psych and TTS at bedside. 

## 2020-02-11 NOTE — ED Notes (Signed)
IVC, pend placement 

## 2020-02-11 NOTE — ED Notes (Signed)
Pt given a second [place] sandwich tray.

## 2020-02-11 NOTE — ED Notes (Addendum)
Patient Items:  Black Jacket [employer] Shirt [employer] Long Sleeve Shirt Maroon Long Sleeve Shirt Black Shirt [employer] Sweat Pants [employer] Shorts White Fila Shoes White Socks Light Green Mask

## 2020-02-11 NOTE — Consult Note (Signed)
[facility]   Reason for Consult:   OOC control behaviors, worsening psychosis and mania violent tendencies at place where he lives     Referring Physician:   ER MD  Patient Identification: Dylan Jordan MRN:  [id number] Principal Diagnosis: <principal problem not specified> Diagnosis:  Active Problems:   * No active hospital problems. *  Schizoaffective Disorder Bipolar type   Total Time spent with patient:   40 min or so      Subjective:   Dylan Jordan is a 64 y.o. male patient admitted with  Breakthrough psychosis and mania     HPI:   Has had violent outbursts and aggression where he lives.  His psychosis and mania are breaking through  --unclear safety margin  He is worsening with issues of illogical disorganized thought --paranoia fearfulness, at times not making sense.  Place where he stays cannot handle him due to violent and psychotic tendencies   Now needs admission and med mgt adjustment and changes       Past Psychiatric History:   Pending --because at present he is not reliable need more collateral from group home and TTS is helping with this   Risk to Self:  none  Risk to Others:  high  Prior Inpatient Therapy:  none recently  Prior Outpatient Therapy:   not clear yet   Past Medical History:  Past Medical History:  Diagnosis Date  . Anemia   . Asthma   . Diabetes mellitus without complication (HCC)   . Gout   . Hyperlipidemia   . Hyperosmolar hyperglycemic coma due to diabetes mellitus without ketoacidosis (HCC)   . Hypertension   . Schizophrenia (HCC) 07/06/2018    Past Surgical History:  Procedure Laterality Date  . REPLACEMENT TOTAL KNEE Left   . TOE SURGERY     Family History:  Family History  Adopted: Yes  Problem Relation Age of Onset  . Allergic rhinitis Neg Hx   . Angioedema Neg Hx   . Asthma Neg Hx   . Atopy Neg Hx   . Eczema Neg Hx   . Immunodeficiency Neg Hx   . Urticaria Neg Hx    Family Psychiatric   History: we cannot assess he is not cooperative and too psychotic for details  Social History:  Social History   Substance and Sexual Activity  Alcohol Use No     Social History   Substance and Sexual Activity  Drug Use No    Social History   Socioeconomic History  . Marital status: Single    Spouse name: Not on file  . Number of children: Not on file  . Years of education: Not on file  . Highest education level: Not on file  Occupational History  . Not on file  Tobacco Use  . Smoking status: Current Some Day Smoker    Packs/day: 0.30    Years: 45.00    Pack years: 13.50    Types: Cigarettes    Start date: 05/16/1971  . Smokeless tobacco: Never Used  . Tobacco comment: Decreased Intake to 10 cigarettes per week or less.  Vaping Use  . Vaping Use: Never used  Substance and Sexual Activity  . Alcohol use: No  . Drug use: No  . Sexual activity: Not on file  Other Topics Concern  . Not on file  Social History Narrative   Lives by himself at home. Closest family member is niece.       ETOH none  Drugs non e   Current smoker, none for three days    Social Determinants of Health   Financial Resource Strain:   . Difficulty of Paying Living Expenses: Not on file  Food Insecurity:   . Worried About Programme researcher, broadcasting/film/video in the Last Year: Not on file  . Ran Out of Food in the Last Year: Not on file  Transportation Needs:   . Lack of Transportation (Medical): Not on file  . Lack of Transportation (Non-Medical): Not on file  Physical Activity:   . Days of Exercise per Week: Not on file  . Minutes of Exercise per Session: Not on file  Stress:   . Feeling of Stress : Not on file  Social Connections:   . Frequency of Communication with Friends and Family: Not on file  . Frequency of Social Gatherings with Friends and Family: Not on file  . Attends Religious Services: Not on file  . Active Member of Clubs or Organizations: Not on file  . Attends Banker  Meetings: Not on file  . Marital Status: Not on file   Additional Social History:    Allergies:   Allergies  Allergen Reactions  . Chantix [Varenicline Tartrate] Other (See Comments)    Nightmares  . Ativan [Lorazepam] Other (See Comments)    Agitation (and "5 others like it") Will need to call [facility] [place] (phone) [phone] and [phone] to fax a release-   . Shellfish-Derived Products Itching  . Penicillins Diarrhea    Has patient had a PCN reaction causing immediate rash, facial/tongue/throat swelling, SOB or lightheadedness with hypotension: No Has patient had a PCN reaction causing severe rash involving mucus membranes or skin necrosis: No Has patient had a PCN reaction that required hospitalization: No Has patient had a PCN reaction occurring within the last 10 years: No If all of the above answers are "NO", then may proceed with Cephalosporin use.     Labs:  Results for orders placed or performed during the hospital encounter of 02/11/20 (from the past 48 hour(s))  Comprehensive metabolic panel     Status: Abnormal   Collection Time: 02/11/20  1:24 PM  Result Value Ref Range   Sodium 136 135 - 145 mmol/L   Potassium 4.4 3.5 - 5.1 mmol/L   Chloride 104 98 - 111 mmol/L   CO2 23 22 - 32 mmol/L   Glucose, Bld 142 (H) 70 - 99 mg/dL    Comment: Glucose reference range applies only to samples taken after fasting for at least 8 hours.   BUN 16 8 - 23 mg/dL   Creatinine, Ser 1.43 (H) 0.61 - 1.24 mg/dL   Calcium 9.3 8.9 - 88.8 mg/dL   Total Protein 7.7 6.5 - 8.1 g/dL   Albumin 3.5 3.5 - 5.0 g/dL   AST 24 15 - 41 U/L   ALT 28 0 - 44 U/L   Alkaline Phosphatase 44 38 - 126 U/L   Total Bilirubin 0.7 0.3 - 1.2 mg/dL   GFR calc non Af Amer 58 (L) >60 mL/min   GFR calc Af Amer >60 >60 mL/min   Anion gap 9 5 - 15    Comment: Performed at [facility], [address] [place], [place] [address]  Ethanol     Status: None   Collection Time:  02/11/20  1:24 PM  Result Value Ref Range   Alcohol, Ethyl (B) <10 <10 mg/dL    Comment: (NOTE) Lowest detectable limit  for serum alcohol is 10 mg/dL.  For medical purposes only. Performed at [facility], [address] [place], [place] [address]   Salicylate level     Status: Abnormal   Collection Time: 02/11/20  1:24 PM  Result Value Ref Range   Salicylate Lvl <7.0 (L) 7.0 - 30.0 mg/dL    Comment: Performed at [facility], [address] [place], [place] [address]  Acetaminophen level     Status: Abnormal   Collection Time: 02/11/20  1:24 PM  Result Value Ref Range   Acetaminophen (Tylenol), Serum <10 (L) 10 - 30 ug/mL    Comment: (NOTE) Therapeutic concentrations vary significantly. A range of 10-30 ug/mL  may be an effective concentration for many patients. However, some  are best treated at concentrations outside of this range. Acetaminophen concentrations >150 ug/mL at 4 hours after ingestion  and >50 ug/mL at 12 hours after ingestion are often associated with  toxic reactions.  Performed at [facility], [address] [place], [place] [address]   cbc     Status: Abnormal   Collection Time: 02/11/20  1:24 PM  Result Value Ref Range   WBC 6.7 4.0 - 10.5 K/uL   RBC 4.15 (L) 4.22 - 5.81 MIL/uL   Hemoglobin 11.5 (L) 13.0 - 17.0 g/dL   HCT 76.1 (L) 39 - 52 %   MCV 84.6 80.0 - 100.0 fL   MCH 27.7 26.0 - 34.0 pg   MCHC 32.8 30.0 - 36.0 g/dL   RDW 60.7 (H) 37.1 - 06.2 %   Platelets 278 150 - 400 K/uL   nRBC 0.0 0.0 - 0.2 %    Comment: Performed at [facility], [address] [place], [place] [address]    No current facility-administered medications for this encounter.   Current Outpatient Medications  Medication Sig Dispense Refill  . allopurinol (ZYLOPRIM) 100 MG tablet Take 2 tablets (200 mg total) by mouth daily. 180 tablet 3  . amLODipine (NORVASC) 10 MG tablet Take 10 mg by mouth daily.    . benztropine (COGENTIN)  1 MG tablet Take 1 tablet (1 mg total) by mouth daily. (Patient taking differently: Take 3 mg by mouth 2 (two) times daily. ) 60 tablet 0  . insulin lispro (HUMALOG) 100 UNIT/ML injection Inject into the skin in the morning and at bedtime.     . Omega-3 Fatty Acids (FISH OIL OMEGA-3) 1000 MG CAPS Take 2 capsules by mouth in the morning and at bedtime.    Marland Kitchen omeprazole (PRILOSEC) 20 MG capsule Take 20 mg by mouth 2 (two) times daily before a meal.    . polyethylene glycol (MIRALAX / GLYCOLAX) 17 g packet Take 17 g by mouth daily as needed. 14 each 0  . senna (SENOKOT) 8.6 MG TABS tablet Take 1 tablet (8.6 mg total) by mouth daily as needed for mild constipation. 120 tablet 0  . tamsulosin (FLOMAX) 0.4 MG CAPS capsule Take 1 capsule (0.4 mg total) by mouth daily after breakfast. 30 capsule 0  . albuterol (PROVENTIL HFA;VENTOLIN HFA) 108 (90 Base) MCG/ACT inhaler Inhale 1-2 puffs into the lungs every 6 (six) hours as needed for wheezing or shortness of breath. 1 Inhaler 2  . atorvastatin (LIPITOR) 20 MG tablet Take 1 tablet (20 mg total) by mouth daily at 6 PM. 30 tablet 0  . divalproex (DEPAKOTE) 500 MG DR tablet Take 1 tablet (500 mg total) by mouth every morning. 30 tablet 0  . divalproex (DEPAKOTE)  500 MG DR tablet Take 4 tablets (2,000 mg total) by mouth every evening. 30 tablet 0  . ferrous sulfate 325 (65 FE) MG EC tablet Take 1 tablet (325 mg total) by mouth every other day. 45 tablet 0  . finasteride (PROSCAR) 5 MG tablet Take 1 tablet (5 mg total) by mouth daily. 30 tablet 0  . gabapentin (NEURONTIN) 300 MG capsule Take 2 capsules (600 mg total) by mouth 3 (three) times daily. 180 capsule 0  . hydrOXYzine (ATARAX/VISTARIL) 25 MG tablet Take 1 tablet (25 mg total) by mouth 2 (two) times daily as needed for anxiety. 30 tablet 0  . Insulin Glargine (LANTUS SOLOSTAR) 100 UNIT/ML Solostar Pen Inject 35 Units into the skin daily. 15 mL 11  . lithium carbonate 150 MG capsule Take 1 capsule (150 mg  total) by mouth 2 (two) times daily with a meal. 30 capsule 0  . metFORMIN (FORTAMET) 1000 MG (OSM) 24 hr tablet Take 1 tablet (1,000 mg total) by mouth daily with breakfast. 30 tablet 0    Musculoskeletal: Strength & Muscle Tone: normal   Gait & Station: no change  Patient leans: na.   Psychiatric Specialty Exam: Physical Exam  Review of Systems  Blood pressure 130/81, pulse 98, temperature 98.6 F (37 C), temperature source Oral, resp. rate 18, height 6\' 1"  (1.854 m), weight 111.1 kg, SpO2 98 %.Body mass index is 32.32 kg/m.  Mental Status   Limited due to lack of cooperation  Strange and odd at baseline Rapport and eye contact poor No shakes tics tremors Concentration and attention fair Consciousness not clouded or fluctuant Oriented times three Judgement insight reliability intelligence fund of knowledge all poor Abstraction limited  Memory not clear he is too ill to assess   Is paranoid fearful strange, suspicious has LOA FOI at times does not make sense at all.     SI and HI ---none                                                            Sleep on and off Assets not clear ADL's impaired from psychosis Cognition ---declining Aims not done  Handedness not known  Language englishy  Recall poor Akathisia none Psychomotor --variable       Treatment Plan Summary:   Acute psychosis and mood breakthrough with increasing violence and IED issues at place where he lives   Remains on IVC  Pending Bed and admission    Disposition: awaits admission   Tool ill for discharge     , MD 02/11/2020 4:01 PM

## 2020-02-11 NOTE — ED Provider Notes (Signed)
[facility] Emergency Department Provider Note    First MD Initiated Contact with Patient 02/11/20 1343     (approximate)  I have reviewed the triage vital signs and the nursing notes.   HISTORY  Chief Complaint Aggressive Behavior    HPI Dylan Jordan is a 64 y.o. male presents to the ER for evaluation of aggressive behavior agitation and violence towards his care resident.  Patient comes under IVC via [employer].  Patient states that he is Islamic and does not eat pork and states that he got angry because his food was contaminated with pork.  He denies any other complaints.    Past Medical History:  Diagnosis Date  . Anemia   . Asthma   . Diabetes mellitus without complication ([place])   . Gout   . Hyperlipidemia   . Hyperosmolar hyperglycemic coma due to diabetes mellitus without ketoacidosis ([place])   . Hypertension   . Schizophrenia ([place]) 07/06/2018   Family History  Adopted: Yes  Problem Relation Age of Onset  . Allergic rhinitis Neg Hx   . Angioedema Neg Hx   . Asthma Neg Hx   . Atopy Neg Hx   . Eczema Neg Hx   . Immunodeficiency Neg Hx   . Urticaria Neg Hx    Past Surgical History:  Procedure Laterality Date  . REPLACEMENT TOTAL KNEE Left   . TOE SURGERY     Patient Active Problem List   Diagnosis Date Noted  . Hyperkalemia   . Schizoaffective disorder, bipolar type ([place]) 02/21/2019  . Hallucinations   . Anxiety 09/18/2018  . Swelling of both lower extremities 09/13/2018  . Type 2 diabetes mellitus without complication, with long-term current use of insulin ([place])   . Anemia   . Hyperlipidemia 12/18/2017  . 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 Intermittent chest pain 11/25/2017  . Allergic rhinitis with a nonallergic component 07/30/2017  . Food allergy 07/30/2017  . Chronic gout involving toe of left foot without tophus 05/18/2017  . Hemorrhoids 05/04/2017  . Haloperidol adverse reaction  04/16/2017  . Chronic low back pain 01/03/2017  . Chronic cough 06/30/2016  . Drug-induced mood disorder ([place]) 06/14/2016  . Type 2 diabetes mellitus with diabetic neuropathy ([place]) 05/26/2016  . Iron deficiency anemia 05/26/2016  . Chronic kidney disease 05/26/2016  . HTN (hypertension) 05/26/2016  . Tobacco abuse 05/26/2016  . History of substance abuse ([place]) 05/26/2016  . History of alcohol abuse 05/26/2016  . Mild persistent asthma 05/26/2016      Prior to Admission medications   Medication Sig Start Date End Date Taking? Authorizing Provider  Accu-Chek Softclix Lancets lancets 1 each by Other route 3 (three) times daily. 09/23/18   [name], DO  albuterol (PROVENTIL HFA;VENTOLIN HFA) 108 (90 Base) MCG/ACT inhaler Inhale 1-2 puffs into the lungs every 6 (six) hours as needed for wheezing or shortness of breath. 03/25/18   Bobbitt, [place] Muta, MD  allopurinol (ZYLOPRIM) 100 MG tablet Take 2 tablets (200 mg total) by mouth daily. 01/10/18   Zenia Resides, MD  amLODipine (NORVASC) 10 MG tablet Take 10 mg by mouth daily.    [provider]  atorvastatin (LIPITOR) 20 MG tablet Take 1 tablet (20 mg total) by mouth daily at 6 PM. 03/28/19   Daisy Floro, DO  benztropine (COGENTIN) 1 MG tablet Take 1 tablet (1 mg total) by mouth daily. 03/28/19   Daisy Floro, DO  Blood Glucose Monitoring  Suppl (ACCU-CHEK AVIVA PLUS) w/Device KIT Use to check blood sugar three times daily or as directed. 09/23/18   [name], DO  divalproex (DEPAKOTE) 500 MG DR tablet Take 1 tablet (500 mg total) by mouth every morning. 03/29/19   Milus Banister C, DO  divalproex (DEPAKOTE) 500 MG DR tablet Take 4 tablets (2,000 mg total) by mouth every evening. 03/28/19   Daisy Floro, DO  famotidine (PEPCID) 20 MG tablet Take 20 mg by mouth 2 (two) times daily.    [provider]  ferrous sulfate 325 (65 FE) MG EC tablet Take 1 tablet (325 mg total) by mouth every other  day. 07/14/19   Shirley, [place], DO  finasteride (PROSCAR) 5 MG tablet Take 1 tablet (5 mg total) by mouth daily. 03/29/19   Daisy Floro, DO  gabapentin (NEURONTIN) 300 MG capsule Take 2 capsules (600 mg total) by mouth 3 (three) times daily. 03/28/19   Milus Banister C, DO  glucose blood (ACCU-CHEK AVIVA PLUS) test strip Dispense QS for twice daily testing. 09/23/18   [name], DO  haloperidol (HALDOL) 5 MG tablet Take 1 tablet (5 mg total) by mouth at bedtime. 03/28/19   Daisy Floro, DO  hydrOXYzine (ATARAX/VISTARIL) 25 MG tablet Take 1 tablet (25 mg total) by mouth 2 (two) times daily as needed for anxiety. 03/28/19   Daisy Floro, DO  Insulin Glargine (LANTUS SOLOSTAR) 100 UNIT/ML Solostar Pen Inject 35 Units into the skin daily. 03/28/19   Daisy Floro, DO  Insulin Pen Needle (BD PEN NEEDLE NANO U/F) 32G X 4 MM MISC Inject 1 applicator as directed 2 (two) times daily. 01/14/19   Shirley, [place], DO  Insulin Pen Needle (PEN NEEDLES) 31G X 8 MM MISC Use as directed 03/28/19   Milus Banister C, DO  lithium carbonate 150 MG capsule Take 1 capsule (150 mg total) by mouth 2 (two) times daily with a meal. 03/28/19   Daisy Floro, DO  losartan (COZAAR) 25 MG tablet Take 1 tablet (25 mg total) by mouth at bedtime. 03/28/19   Daisy Floro, DO  metFORMIN (FORTAMET) 1000 MG (OSM) 24 hr tablet Take 1 tablet (1,000 mg total) by mouth daily with breakfast. 03/29/19   Milus Banister C, DO  NOVOFINE PLUS 32G X 4 MM MISC 1 Container by Does not apply route daily. 05/18/18   [provider]  polyethylene glycol (MIRALAX / GLYCOLAX) 17 g packet Take 17 g by mouth daily as needed. 03/28/19   Daisy Floro, DO  senna (SENOKOT) 8.6 MG TABS tablet Take 1 tablet (8.6 mg total) by mouth daily as needed for mild constipation. 03/28/19   Daisy Floro, DO  tamsulosin (FLOMAX) 0.4 MG CAPS capsule Take 1 capsule (0.4 mg total) by mouth daily after breakfast.  03/29/19   Daisy Floro, DO    Allergies Chantix [varenicline tartrate], Ativan [lorazepam], Shellfish-derived products, and Penicillins    Social History Social History   Tobacco Use  . Smoking status: Current Some Day Smoker    Packs/day: 0.30    Years: 45.00    Pack years: 13.50    Types: Cigarettes    Start date: 05/16/1971  . Smokeless tobacco: Never Used  . Tobacco comment: Decreased Intake to 10 cigarettes per week or less.  Vaping Use  . Vaping Use: Never used  Substance Use Topics  . Alcohol use: No  . Drug use: No    Review of Systems Patient denies  headaches, rhinorrhea, blurry vision, numbness, shortness of breath, chest pain, edema, cough, abdominal pain, nausea, vomiting, diarrhea, dysuria, fevers, rashes or hallucinations unless otherwise stated above in HPI. ____________________________________________   PHYSICAL EXAM:  VITAL SIGNS: Vitals:   02/11/20 1334  BP: 130/81  Pulse: 98  Resp: 18  Temp: 98.6 F (37 C)  SpO2: 98%    Constitutional: Alert in nad Eyes: Conjunctivae are normal.  Head: Atraumatic. Nose: No congestion/rhinnorhea. Mouth/Throat: Mucous membranes are moist.   Neck: No stridor. Painless ROM.  Cardiovascular: Normal rate, regular rhythm. Grossly normal heart sounds.  Good peripheral circulation. Respiratory: Normal respiratory effort.  No retractions. Lungs CTAB. Gastrointestinal: Soft and nontender. No distention. No abdominal bruits. No CVA tenderness. Genitourinary:  Musculoskeletal: No lower extremity tenderness nor edema.  No joint effusions. Neurologic:  Normal speech and language. No gross focal neurologic deficits are appreciated. No facial droop Skin:  Skin is warm, dry and intact. No rash noted. Psychiatric: slightly agitated but redirectable  ____________________________________________   LABS (all labs ordered are listed, but only abnormal results are displayed)  Results for orders placed or performed  during the hospital encounter of 02/11/20 (from the past 24 hour(s))  Ethanol     Status: None   Collection Time: 02/11/20  1:24 PM  Result Value Ref Range   Alcohol, Ethyl (B) <10 <10 mg/dL  cbc     Status: Abnormal   Collection Time: 02/11/20  1:24 PM  Result Value Ref Range   WBC 6.7 4.0 - 10.5 K/uL   RBC 4.15 (L) 4.22 - 5.81 MIL/uL   Hemoglobin 11.5 (L) 13.0 - 17.0 g/dL   HCT 35.1 (L) 39 - 52 %   MCV 84.6 80.0 - 100.0 fL   MCH 27.7 26.0 - 34.0 pg   MCHC 32.8 30.0 - 36.0 g/dL   RDW 16.4 (H) 11.5 - 15.5 %   Platelets 278 150 - 400 K/uL   nRBC 0.0 0.0 - 0.2 %   ____________________________________________  ____________________________________________  RADIOLOGY   ____________________________________________   PROCEDURES  Procedure(s) performed:  Procedures    Critical Care performed: no ____________________________________________   INITIAL IMPRESSION / ASSESSMENT AND PLAN / ED COURSE  Pertinent labs & imaging results that were available during my care of the patient were reviewed by me and considered in my medical decision making (see chart for details).   DDX: Psychosis, delirium, medication effect, noncompliance, polysubstance abuse, Si, Hi, depression   Cobi Delph is a 64 y.o. who presents to the ED with for evaluation of agitation and violent behavior.  Patient has psych history of schizophrenia.  Laboratory testing was ordered to evaluation for underlying electrolyte derangement or signs of underlying organic pathology to explain today's presentation.  Based on history and physical and laboratory evaluation, it appears that the patient's presentation is 2/2 underlying psychiatric disorder and will require further evaluation and management by inpatient psychiatry.  Patient was  made an IVC due to violent behvaior.  Disposition pending psychiatric evaluation.      The patient was evaluated in Emergency Department today for the symptoms described in the  history of present illness. He/she was evaluated in the context of the global COVID-19 pandemic, which necessitated consideration that the patient might be at risk for infection with the SARS-CoV-2 virus that causes COVID-19. Institutional protocols and algorithms that pertain to the evaluation of patients at risk for COVID-19 are in a state of rapid change based on information released by regulatory bodies including the CDC and federal  and state organizations. These policies and algorithms were followed during the patient's care in the ED.  As part of my medical decision making, I reviewed the following data within the [place] notes reviewed and incorporated, Labs reviewed, notes from prior ED visits and [place] Controlled Substance Database   ____________________________________________   FINAL CLINICAL IMPRESSION(S) / ED DIAGNOSES  Final diagnoses:  Agitation      NEW MEDICATIONS STARTED DURING THIS VISIT:  New Prescriptions   No medications on file     Note:  This document was prepared using Dragon voice recognition software and may include unintentional dictation errors.    Merlyn Lot, MD 02/11/20 1359

## 2020-02-11 NOTE — ED Notes (Signed)
Given a snack  

## 2020-02-12 LAB — URINE DRUG SCREEN, QUALITATIVE (ARMC ONLY)
Amphetamines, Ur Screen: NOT DETECTED
Barbiturates, Ur Screen: NOT DETECTED
Benzodiazepine, Ur Scrn: NOT DETECTED
Cannabinoid 50 Ng, Ur ~~LOC~~: NOT DETECTED
Cocaine Metabolite,Ur ~~LOC~~: NOT DETECTED
MDMA (Ecstasy)Ur Screen: NOT DETECTED
Methadone Scn, Ur: NOT DETECTED
Opiate, Ur Screen: NOT DETECTED
Phencyclidine (PCP) Ur S: NOT DETECTED
Tricyclic, Ur Screen: NOT DETECTED

## 2020-02-12 NOTE — ED Provider Notes (Signed)
Vitals:   02/11/20 1334 02/11/20 1955  BP: 130/81 135/84  Pulse: 98 75  Resp: 18 20  Temp: 98.6 F (37 C) 98.6 F (37 C)  SpO2: 98% 95%    Patient ambulatory, no distress.  Appears stable for transfer.  I did answer his questions regarding transfer to [facility], he is not pleased with the decision, but also not resistant.  He reports to me that he is going through a lot of tumultuous family issues, and does not feel that he has an acute psychiatric disorder.  However, of note the patient is also noted to occasionally be howling Wolf-like sounds at random.  Patient stable for transfer to ongoing psychiatric care    Sharyn Creamer, MD 02/12/20 0830

## 2020-02-12 NOTE — ED Provider Notes (Signed)
Emergency Medicine Observation Re-evaluation Note  Dylan Jordan is a 64 y.o. male, seen on rounds today.  Pt initially presented to the ED for complaints of Aggressive Behavior Currently, the patient is sitting on his bed stating he needs surgery to his eye.  Physical Exam  BP 135/84 (BP Location: Left Arm)   Pulse 75   Temp 98.6 F (37 C)   Resp 20   Ht 6\' 1"  (1.854 m)   Wt 111.1 kg   SpO2 95%   BMI 32.32 kg/m  Physical Exam Constitutional: Resting comfortably. Eyes: Conjunctivae are normal. Head: Atraumatic. Nose: No congestion/rhinnorhea. Mouth/Throat: Mucous membranes are moist. Neck: Normal ROM Cardiovascular: No cyanosis noted. Respiratory: Normal respiratory effort. Gastrointestinal: Non-distended. Genitourinary: deferred Musculoskeletal: No lower extremity tenderness nor edema. Neurologic:  Normal speech and language. No gross focal neurologic deficits are appreciated. Skin:  Skin is warm, dry and intact. No rash noted.    ED Course / MDM  EKG:    I have reviewed the labs performed to date as well as medications administered while in observation.  Recent changes in the last 24 hours include none.  Plan  Current plan is for psychiatric evaluation to determine appropriate disposition. Patient is under full IVC at this time.   , MD 02/12/20 [phone]

## 2020-02-12 NOTE — ED Notes (Signed)
Another patient is screaming and yelling, which is causing this patient to begin yelling and screaming, he is barking like a dog

## 2020-02-12 NOTE — ED Notes (Signed)
Patient transferred to Dylan Jordan with [facility] Dept, patient and sheriff received transfer papers. Patient received belongings and verbalized he has received all of his belongings. Patient appropriate and cooperative, Denies SI/HI AVH. Vital signs taken. NAD noted.

## 2020-02-12 NOTE — BH Assessment (Signed)
Patient has been accepted to [facility].  Patient assigned to [facility] Accepting physician is Dr. Betti Cruz.  Call report to [phone].  Representative was [employer].   ER Staff is aware of it:  Collene, ER Secretary  Dr. Larinda Buttery, ER MD  Selena Batten, Patient's Nurse     Pt can arrive anytime after 9am. Instructions are for pt to report to [facility] for covid testing.

## 2020-02-12 NOTE — ED Notes (Signed)
Attempted to call [name] and inform her that her uncle has left, no answer and unable to leave a message

## 2020-02-12 NOTE — ED Notes (Signed)
Patient is now yelling out Breakfast

## 2020-02-12 NOTE — ED Notes (Signed)
Patient refused medications says he is not taking anything he is a muslim and he cant take any medications

## 2020-03-14 ENCOUNTER — Emergency Department (HOSPITAL_COMMUNITY)
Admission: EM | Admit: 2020-03-14 | Discharge: 2020-03-15 | Disposition: A | Discharge status: Home / Self Care | Payer: Medicaid Other | Attending: Emergency Medicine | Admitting: Emergency Medicine

## 2020-03-14 ENCOUNTER — Encounter (HOSPITAL_COMMUNITY): Payer: Self-pay | Admitting: Obstetrics and Gynecology

## 2020-03-14 ENCOUNTER — Other Ambulatory Visit: Payer: Self-pay

## 2020-03-14 DIAGNOSIS — J45909 Unspecified asthma, uncomplicated: Secondary | ICD-10-CM | POA: Diagnosis not present

## 2020-03-14 DIAGNOSIS — I129 Hypertensive chronic kidney disease with stage 1 through stage 4 chronic kidney disease, or unspecified chronic kidney disease: Secondary | ICD-10-CM | POA: Insufficient documentation

## 2020-03-14 DIAGNOSIS — Z96652 Presence of left artificial knee joint: Secondary | ICD-10-CM | POA: Insufficient documentation

## 2020-03-14 DIAGNOSIS — F209 Schizophrenia, unspecified: Secondary | ICD-10-CM

## 2020-03-14 DIAGNOSIS — Z20822 Contact with and (suspected) exposure to covid-19: Secondary | ICD-10-CM | POA: Insufficient documentation

## 2020-03-14 DIAGNOSIS — F17210 Nicotine dependence, cigarettes, uncomplicated: Secondary | ICD-10-CM | POA: Insufficient documentation

## 2020-03-14 DIAGNOSIS — Z79899 Other long term (current) drug therapy: Secondary | ICD-10-CM | POA: Insufficient documentation

## 2020-03-14 DIAGNOSIS — Z794 Long term (current) use of insulin: Secondary | ICD-10-CM | POA: Insufficient documentation

## 2020-03-14 DIAGNOSIS — F250 Schizoaffective disorder, bipolar type: Secondary | ICD-10-CM | POA: Insufficient documentation

## 2020-03-14 DIAGNOSIS — E1165 Type 2 diabetes mellitus with hyperglycemia: Secondary | ICD-10-CM | POA: Diagnosis not present

## 2020-03-14 DIAGNOSIS — E1140 Type 2 diabetes mellitus with diabetic neuropathy, unspecified: Secondary | ICD-10-CM | POA: Insufficient documentation

## 2020-03-14 DIAGNOSIS — R739 Hyperglycemia, unspecified: Secondary | ICD-10-CM

## 2020-03-14 DIAGNOSIS — F919 Conduct disorder, unspecified: Secondary | ICD-10-CM

## 2020-03-14 DIAGNOSIS — N189 Chronic kidney disease, unspecified: Secondary | ICD-10-CM | POA: Diagnosis not present

## 2020-03-14 DIAGNOSIS — F989 Unspecified behavioral and emotional disorders with onset usually occurring in childhood and adolescence: Secondary | ICD-10-CM | POA: Diagnosis present

## 2020-03-14 LAB — CBC WITH DIFFERENTIAL/PLATELET
Abs Immature Granulocytes: 0.02 10*3/uL (ref 0.00–0.07)
Basophils Absolute: 0 10*3/uL (ref 0.0–0.1)
Basophils Relative: 0 %
Eosinophils Absolute: 0.1 10*3/uL (ref 0.0–0.5)
Eosinophils Relative: 2 %
HCT: 38.3 % — ABNORMAL LOW (ref 39.0–52.0)
Hemoglobin: 11.4 g/dL — ABNORMAL LOW (ref 13.0–17.0)
Immature Granulocytes: 0 %
Lymphocytes Relative: 33 %
Lymphs Abs: 1.7 10*3/uL (ref 0.7–4.0)
MCH: 28.1 pg (ref 26.0–34.0)
MCHC: 29.8 g/dL — ABNORMAL LOW (ref 30.0–36.0)
MCV: 94.6 fL (ref 80.0–100.0)
Monocytes Absolute: 0.5 10*3/uL (ref 0.1–1.0)
Monocytes Relative: 9 %
Neutro Abs: 2.9 10*3/uL (ref 1.7–7.7)
Neutrophils Relative %: 56 %
Platelets: 222 10*3/uL (ref 150–400)
RBC: 4.05 MIL/uL — ABNORMAL LOW (ref 4.22–5.81)
RDW: 15 % (ref 11.5–15.5)
WBC: 5.3 10*3/uL (ref 4.0–10.5)
nRBC: 0 % (ref 0.0–0.2)

## 2020-03-14 LAB — COMPREHENSIVE METABOLIC PANEL
ALT: 11 U/L (ref 0–44)
AST: 13 U/L — ABNORMAL LOW (ref 15–41)
Albumin: 3.6 g/dL (ref 3.5–5.0)
Alkaline Phosphatase: 52 U/L (ref 38–126)
Anion gap: 8 (ref 5–15)
BUN: 35 mg/dL — ABNORMAL HIGH (ref 8–23)
CO2: 18 mmol/L — ABNORMAL LOW (ref 22–32)
Calcium: 8.9 mg/dL (ref 8.9–10.3)
Chloride: 108 mmol/L (ref 98–111)
Creatinine, Ser: 1.63 mg/dL — ABNORMAL HIGH (ref 0.61–1.24)
GFR, Estimated: 47 mL/min — ABNORMAL LOW (ref >60)
Glucose, Bld: 322 mg/dL — ABNORMAL HIGH (ref 70–99)
Potassium: 6 mmol/L — ABNORMAL HIGH (ref 3.5–5.1)
Sodium: 134 mmol/L — ABNORMAL LOW (ref 135–145)
Total Bilirubin: 0.4 mg/dL (ref 0.3–1.2)
Total Protein: 7.4 g/dL (ref 6.5–8.1)

## 2020-03-14 LAB — RESPIRATORY PANEL BY RT PCR (FLU A&B, COVID)
Influenza A by PCR: NEGATIVE
Influenza B by PCR: NEGATIVE
SARS Coronavirus 2 by RT PCR: NEGATIVE

## 2020-03-14 LAB — BLOOD GAS, VENOUS
Acid-base deficit: 5.8 mmol/L — ABNORMAL HIGH (ref 0.0–2.0)
Bicarbonate: 21 mmol/L (ref 20.0–28.0)
O2 Saturation: 74.9 %
Patient temperature: 98.6
pCO2, Ven: 49.4 mmHg (ref 44.0–60.0)
pH, Ven: 7.251 (ref 7.250–7.430)
pO2, Ven: 46.4 mmHg — ABNORMAL HIGH (ref 32.0–45.0)

## 2020-03-14 LAB — PROTIME-INR
INR: 1 (ref 0.8–1.2)
Prothrombin Time: 12.3 seconds (ref 11.4–15.2)

## 2020-03-14 LAB — CBG MONITORING, ED: Glucose-Capillary: 150 mg/dL — ABNORMAL HIGH (ref 70–99)

## 2020-03-14 LAB — LITHIUM LEVEL: Lithium Lvl: 1.03 mmol/L (ref 0.60–1.20)

## 2020-03-14 LAB — TROPONIN I (HIGH SENSITIVITY): Troponin I (High Sensitivity): <2 ng/L (ref <18)

## 2020-03-14 LAB — ETHANOL: Alcohol, Ethyl (B): <10 mg/dL (ref <10)

## 2020-03-14 MED ORDER — PANTOPRAZOLE SODIUM 40 MG PO TBEC
40.0000 mg | DELAYED_RELEASE_TABLET | Freq: Every day | ORAL | Status: DC
  Administered 2020-03-14: 40 mg via ORAL
  Filled 2020-03-14 (×2): qty 1

## 2020-03-14 MED ORDER — AMLODIPINE BESYLATE 5 MG PO TABS
10.0000 mg | ORAL_TABLET | Freq: Every day | ORAL | Status: DC
  Administered 2020-03-14: 10 mg via ORAL
  Filled 2020-03-14 (×2): qty 2

## 2020-03-14 MED ORDER — BENZTROPINE MESYLATE 1 MG PO TABS
3.0000 mg | ORAL_TABLET | Freq: Two times a day (BID) | ORAL | Status: DC
  Administered 2020-03-14: 3 mg via ORAL
  Filled 2020-03-14 (×2): qty 3

## 2020-03-14 MED ORDER — ALBUTEROL SULFATE HFA 108 (90 BASE) MCG/ACT IN AERS: 1.0000 | INHALATION_SPRAY | Freq: Four times a day (QID) | RESPIRATORY_TRACT | Status: DC | PRN

## 2020-03-14 MED ORDER — INSULIN GLARGINE 100 UNIT/ML ~~LOC~~ SOLN
15.0000 [IU] | Freq: Every day | SUBCUTANEOUS | Status: DC
  Filled 2020-03-14 (×2): qty 0.15

## 2020-03-14 MED ORDER — OLANZAPINE 10 MG PO TBDP
10.0000 mg | ORAL_TABLET | Freq: Every day | ORAL | Status: DC
  Filled 2020-03-14 (×2): qty 1

## 2020-03-14 MED ORDER — DIVALPROEX SODIUM 500 MG PO DR TAB
2000.0000 mg | DELAYED_RELEASE_TABLET | Freq: Every evening | ORAL | Status: DC
  Administered 2020-03-15: 2000 mg via ORAL
  Filled 2020-03-14 (×3): qty 4

## 2020-03-14 MED ORDER — SODIUM CHLORIDE 0.9 % IV SOLN: Freq: Once | INTRAVENOUS | Status: AC

## 2020-03-14 MED ORDER — DIVALPROEX SODIUM 500 MG PO DR TAB
500.0000 mg | DELAYED_RELEASE_TABLET | Freq: Every morning | ORAL | Status: DC
  Filled 2020-03-14 (×2): qty 1

## 2020-03-14 MED ORDER — SODIUM CHLORIDE 0.9 % IV BOLUS
500.0000 mL | Freq: Once | INTRAVENOUS | Status: AC
  Administered 2020-03-14: 500 mL via INTRAVENOUS

## 2020-03-14 MED ORDER — LITHIUM CARBONATE ER 450 MG PO TBCR
450.0000 mg | EXTENDED_RELEASE_TABLET | Freq: Every day | ORAL | Status: DC
  Filled 2020-03-14 (×2): qty 1

## 2020-03-14 NOTE — ED Triage Notes (Signed)
Per EMS: Patient was recently kicked out of a local motel for causing disturbances and has been agitated with EMS and would not let them start an IV. CBG 450

## 2020-03-14 NOTE — ED Notes (Signed)
Pt has stratigic interventions with act services please call [facility] at [phone] for placement

## 2020-03-14 NOTE — ED Notes (Signed)
Pt refused cbg and blood draw

## 2020-03-14 NOTE — ED Notes (Signed)
RN spoke to [employer] at [employer], they provide act services. He states that pt was kicked out of hotel he was staying due to annoying the hotel staff and he can not get him placement for the pt until tomorrow. Charge and Dr made aware.
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
## 2020-03-14 NOTE — ED Notes (Signed)
Pt continues to refuse to let staff get blood for recheck troponin. Pt wants to leave. And has been refusing care.

## 2020-03-15 LAB — BASIC METABOLIC PANEL
Anion gap: 10 (ref 5–15)
BUN: 28 mg/dL — ABNORMAL HIGH (ref 8–23)
CO2: 23 mmol/L (ref 22–32)
Calcium: 10.1 mg/dL (ref 8.9–10.3)
Chloride: 109 mmol/L (ref 98–111)
Creatinine, Ser: 1.5 mg/dL — ABNORMAL HIGH (ref 0.61–1.24)
GFR, Estimated: 52 mL/min — ABNORMAL LOW (ref >60)
Glucose, Bld: 184 mg/dL — ABNORMAL HIGH (ref 70–99)
Potassium: 5.9 mmol/L — ABNORMAL HIGH (ref 3.5–5.1)
Sodium: 142 mmol/L (ref 135–145)

## 2020-03-15 LAB — BLOOD GAS, VENOUS
Acid-base deficit: 2 mmol/L (ref 0.0–2.0)
Bicarbonate: 23.8 mmol/L (ref 20.0–28.0)
O2 Saturation: 51.4 %
Patient temperature: 98.6
pCO2, Ven: 47.3 mmHg (ref 44.0–60.0)
pH, Ven: 7.322 (ref 7.250–7.430)
pO2, Ven: <31 mmHg — CL (ref 32.0–45.0)

## 2020-03-15 LAB — TROPONIN I (HIGH SENSITIVITY): Troponin I (High Sensitivity): 3 ng/L (ref <18)

## 2020-03-15 LAB — CBG MONITORING, ED
Glucose-Capillary: 139 mg/dL — ABNORMAL HIGH (ref 70–99)
Glucose-Capillary: 170 mg/dL — ABNORMAL HIGH (ref 70–99)

## 2020-03-15 LAB — LACTIC ACID, PLASMA: Lactic Acid, Venous: 1.3 mmol/L (ref 0.5–1.9)

## 2020-03-15 LAB — HEMOGLOBIN A1C
Hgb A1c MFr Bld: 8.5 % — ABNORMAL HIGH (ref 4.8–5.6)
Mean Plasma Glucose: 197.25 mg/dL

## 2020-03-15 LAB — AMMONIA: Ammonia: 21 umol/L (ref 9–35)

## 2020-03-15 LAB — VALPROIC ACID LEVEL: Valproic Acid Lvl: 86 ug/mL (ref 50.0–100.0)

## 2020-03-15 MED ORDER — ZIPRASIDONE MESYLATE 20 MG IM SOLR: 20.0000 mg | INTRAMUSCULAR | Status: DC | PRN

## 2020-03-15 MED ORDER — GABAPENTIN 400 MG PO CAPS
400.0000 mg | ORAL_CAPSULE | Freq: Three times a day (TID) | ORAL | Status: DC
  Administered 2020-03-15 (×2): 400 mg via ORAL
  Filled 2020-03-15 (×2): qty 1

## 2020-03-15 MED ORDER — INSULIN LISPRO 100 UNIT/ML ~~LOC~~ SOLN: 0.0000 [IU] | Freq: Three times a day (TID) | SUBCUTANEOUS | Status: DC

## 2020-03-15 MED ORDER — INSULIN ASPART 100 UNIT/ML ~~LOC~~ SOLN
0.0000 [IU] | Freq: Every day | SUBCUTANEOUS | Status: DC
  Filled 2020-03-15: qty 0.05

## 2020-03-15 MED ORDER — INSULIN ASPART 100 UNIT/ML ~~LOC~~ SOLN
0.0000 [IU] | Freq: Three times a day (TID) | SUBCUTANEOUS | Status: DC
  Administered 2020-03-15: 2 [IU] via SUBCUTANEOUS
  Filled 2020-03-15: qty 0.09

## 2020-03-15 MED ORDER — RISPERIDONE 1 MG PO TBDP: 2.0000 mg | ORAL_TABLET | Freq: Three times a day (TID) | ORAL | Status: DC | PRN

## 2020-03-15 NOTE — BH Assessment (Signed)
[facility] Assessment Progress Note  Case was staffed with [place] NP who recommended patient be discharged and ACTT be contacted to transport and assist with ongoing needs.
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
## 2020-03-15 NOTE — Discharge Instructions (Addendum)
For your behavioral health needs, you are advised to continue treatment with the [facility]: ° °     [facility] °     [address] °     , [place] [address] °     [phone] °

## 2020-03-15 NOTE — ED Notes (Signed)
Pt refused blood work and urine sample with previous nurse.

## 2020-03-15 NOTE — ED Provider Notes (Addendum)
Emergency Medicine Observation Re-evaluation Note  Dylan Jordan is a 64 y.o. male, seen on rounds today.  Pt initially presented to the ED for complaints of Hyperglycemia Currently, the patient is awaiting repeat labs and placement.   Physical Exam  BP 118/60 (BP Location: Left Arm)   Pulse (!) 55   Temp (!) 97.4 F (36.3 C) (Oral)   Resp 15   SpO2 97%  Physical Exam General: alert, awake, asking about his shoes Cardiac: regular rhythm Lungs: normal effort, no stridor  Psych: speech difficult to understand, rambling  ED Course / MDM  EKG:EKG Interpretation  Date/Time:  Sunday March 14 2020 18:36:49 EDT Ventricular Rate:  79 PR Interval:    QRS Duration: 88 QT Interval:  368 QTC Calculation: 422 R Axis:   -5 Text Interpretation: Sinus rhythm Abnormal R-wave progression, early transition np acute ischemic changes. less t wave flattening compared to previous Confirmed by Arby Barrette [phone]) on 03/14/2020 8:15:43 PM  Clinical Course as of Mar 15 1212  Mon Mar 15, 2020  1213 Patient becoming more agitated.  Geodon ordered   [JK]    Clinical Course User Index [JK] [name], MD   I have reviewed the labs performed to date as well as medications administered while in observation.  Recent changes in the last 24 hours include pt refused repeat labs.  Still need to be drawn.  Plan  Current plan is for recheck labs.[name], MD 03/15/20 3086    [name], MD 03/15/20 1213

## 2020-03-15 NOTE — ED Notes (Signed)
Pt transferred to Room 30 from Room 24 at this time.  Pt ambulates w/o assistance.  Pt denies SI/HI at this time.  Will continue to monitor.

## 2020-03-15 NOTE — BH Assessment (Addendum)
[facility] Assessment Progress Note  Per Shuvon Rankin, NP, this voluntary pt does not require psychiatric hospitalization at this time.  Pt is psychiatrically cleared.  Discharge instructions advise pt to continue treatment with the [facility].  At [facility] recommendation this writer called the ACT Team and left voice message for Judeth Cornfield [phone]), requesting return call to facilitate discharge.  As of this writing, return call is pending.  EDP [name], MD and pt's nurse, Waynetta Sandy, have been notified.  Doylene Canning, MA Triage Specialist [phone]   Addendum:  At 16:28 this writer reached [place].  She reports that she thinks she will be able to find accommodations for this pt tonight.  EDP Alvester Chou, MD agrees to defer discharge for the time being.  Judeth Cornfield has the phone number for pt's nurse, Beth.  Pt is to be discharged from [facility] later this evening.  Doylene Canning, [place] Behavioral Health Coordinator [phone]

## 2020-03-15 NOTE — ED Notes (Signed)
Pt refused Blood Sugar Check

## 2020-03-15 NOTE — ED Notes (Signed)
LUNCH TRAY GIVEN. 

## 2020-03-15 NOTE — ED Notes (Signed)
Critical lab reported to RN by Patrina Levering  .  PO2 < 31 ( less than 31),  Dr Lynelle Doctor notified.

## 2020-03-15 NOTE — ED Notes (Signed)
Pt DCd off unit to home per provider. Pt alert, calm, cooperative, no s/s of distress. DC information given to patient. Belongings given to pt. Pt ambulatory off unit, escorted by NT. Pt transported by caregiver (ACT team).

## 2020-03-15 NOTE — ED Provider Notes (Signed)
Patient refused repeat labs.  He is awaiting placement.     Darrek Leasure, Jonny Ruiz, MD 03/15/20 [phone]

## 2020-03-15 NOTE — ED Notes (Signed)
PT CBG 170

## 2020-03-15 NOTE — ED Notes (Signed)
Pt urinated on himself and all over his room. He states that he did it as a sign of protest due to the fact we are out of [place] sandwiches. He has been offered other meal options and has refused. Pt is uncooperative with staff and is very agitated.

## 2020-03-29 ENCOUNTER — Observation Stay (HOSPITAL_COMMUNITY)
Admission: EM | Admit: 2020-03-29 | Discharge: 2020-03-31 | Disposition: A | Discharge status: Home / Self Care | Payer: Medicaid Other | Attending: Internal Medicine | Admitting: Internal Medicine

## 2020-03-29 ENCOUNTER — Emergency Department (HOSPITAL_COMMUNITY): Payer: Medicaid Other

## 2020-03-29 ENCOUNTER — Encounter (HOSPITAL_COMMUNITY): Payer: Self-pay

## 2020-03-29 DIAGNOSIS — J45909 Unspecified asthma, uncomplicated: Secondary | ICD-10-CM | POA: Diagnosis not present

## 2020-03-29 DIAGNOSIS — Z96652 Presence of left artificial knee joint: Secondary | ICD-10-CM | POA: Insufficient documentation

## 2020-03-29 DIAGNOSIS — Z794 Long term (current) use of insulin: Secondary | ICD-10-CM | POA: Insufficient documentation

## 2020-03-29 DIAGNOSIS — Z79899 Other long term (current) drug therapy: Secondary | ICD-10-CM | POA: Diagnosis not present

## 2020-03-29 DIAGNOSIS — F200 Paranoid schizophrenia: Secondary | ICD-10-CM | POA: Diagnosis not present

## 2020-03-29 DIAGNOSIS — F250 Schizoaffective disorder, bipolar type: Secondary | ICD-10-CM | POA: Diagnosis present

## 2020-03-29 DIAGNOSIS — Z20822 Contact with and (suspected) exposure to covid-19: Secondary | ICD-10-CM | POA: Insufficient documentation

## 2020-03-29 DIAGNOSIS — E119 Type 2 diabetes mellitus without complications: Secondary | ICD-10-CM

## 2020-03-29 DIAGNOSIS — E1122 Type 2 diabetes mellitus with diabetic chronic kidney disease: Secondary | ICD-10-CM | POA: Insufficient documentation

## 2020-03-29 DIAGNOSIS — E875 Hyperkalemia: Secondary | ICD-10-CM | POA: Diagnosis not present

## 2020-03-29 DIAGNOSIS — F17210 Nicotine dependence, cigarettes, uncomplicated: Secondary | ICD-10-CM | POA: Insufficient documentation

## 2020-03-29 DIAGNOSIS — I10 Essential (primary) hypertension: Secondary | ICD-10-CM | POA: Diagnosis present

## 2020-03-29 DIAGNOSIS — I129 Hypertensive chronic kidney disease with stage 1 through stage 4 chronic kidney disease, or unspecified chronic kidney disease: Secondary | ICD-10-CM | POA: Diagnosis not present

## 2020-03-29 DIAGNOSIS — N189 Chronic kidney disease, unspecified: Secondary | ICD-10-CM | POA: Diagnosis present

## 2020-03-29 DIAGNOSIS — R443 Hallucinations, unspecified: Secondary | ICD-10-CM | POA: Diagnosis present

## 2020-03-29 DIAGNOSIS — D509 Iron deficiency anemia, unspecified: Secondary | ICD-10-CM | POA: Diagnosis present

## 2020-03-29 DIAGNOSIS — E1140 Type 2 diabetes mellitus with diabetic neuropathy, unspecified: Secondary | ICD-10-CM | POA: Diagnosis present

## 2020-03-29 LAB — CBC WITH DIFFERENTIAL/PLATELET
Abs Immature Granulocytes: 0.02 10*3/uL (ref 0.00–0.07)
Basophils Absolute: 0 10*3/uL (ref 0.0–0.1)
Basophils Relative: 1 %
Eosinophils Absolute: 0.1 10*3/uL (ref 0.0–0.5)
Eosinophils Relative: 2 %
HCT: 36.7 % — ABNORMAL LOW (ref 39.0–52.0)
Hemoglobin: 11.3 g/dL — ABNORMAL LOW (ref 13.0–17.0)
Immature Granulocytes: 0 %
Lymphocytes Relative: 35 %
Lymphs Abs: 2.2 10*3/uL (ref 0.7–4.0)
MCH: 28.4 pg (ref 26.0–34.0)
MCHC: 30.8 g/dL (ref 30.0–36.0)
MCV: 92.2 fL (ref 80.0–100.0)
Monocytes Absolute: 0.5 10*3/uL (ref 0.1–1.0)
Monocytes Relative: 7 %
Neutro Abs: 3.5 10*3/uL (ref 1.7–7.7)
Neutrophils Relative %: 55 %
Platelets: 251 10*3/uL (ref 150–400)
RBC: 3.98 MIL/uL — ABNORMAL LOW (ref 4.22–5.81)
RDW: 14.9 % (ref 11.5–15.5)
WBC: 6.4 10*3/uL (ref 4.0–10.5)
nRBC: 0 % (ref 0.0–0.2)

## 2020-03-29 LAB — RAPID URINE DRUG SCREEN, HOSP PERFORMED
Amphetamines: NOT DETECTED
Barbiturates: NOT DETECTED
Benzodiazepines: NOT DETECTED
Cocaine: NOT DETECTED
Opiates: NOT DETECTED
Tetrahydrocannabinol: NOT DETECTED

## 2020-03-29 LAB — COMPREHENSIVE METABOLIC PANEL
ALT: 14 U/L (ref 0–44)
AST: 12 U/L — ABNORMAL LOW (ref 15–41)
Albumin: 3.7 g/dL (ref 3.5–5.0)
Alkaline Phosphatase: 53 U/L (ref 38–126)
Anion gap: 6 (ref 5–15)
BUN: 27 mg/dL — ABNORMAL HIGH (ref 8–23)
CO2: 22 mmol/L (ref 22–32)
Calcium: 9.3 mg/dL (ref 8.9–10.3)
Chloride: 110 mmol/L (ref 98–111)
Creatinine, Ser: 1.51 mg/dL — ABNORMAL HIGH (ref 0.61–1.24)
GFR, Estimated: 52 mL/min — ABNORMAL LOW (ref >60)
Glucose, Bld: 139 mg/dL — ABNORMAL HIGH (ref 70–99)
Potassium: 6.7 mmol/L (ref 3.5–5.1)
Sodium: 138 mmol/L (ref 135–145)
Total Bilirubin: 0.5 mg/dL (ref 0.3–1.2)
Total Protein: 7.4 g/dL (ref 6.5–8.1)

## 2020-03-29 LAB — SALICYLATE LEVEL: Salicylate Lvl: <7 mg/dL — ABNORMAL LOW (ref 7.0–30.0)

## 2020-03-29 LAB — LITHIUM LEVEL: Lithium Lvl: 0.94 mmol/L (ref 0.60–1.20)

## 2020-03-29 LAB — CBG MONITORING, ED: Glucose-Capillary: 96 mg/dL (ref 70–99)

## 2020-03-29 LAB — TROPONIN I (HIGH SENSITIVITY): Troponin I (High Sensitivity): <2 ng/L (ref <18)

## 2020-03-29 LAB — POTASSIUM: Potassium: 6.2 mmol/L — ABNORMAL HIGH (ref 3.5–5.1)

## 2020-03-29 LAB — ETHANOL: Alcohol, Ethyl (B): <10 mg/dL (ref <10)

## 2020-03-29 LAB — GLUCOSE, CAPILLARY
Glucose-Capillary: 234 mg/dL — ABNORMAL HIGH (ref 70–99)
Glucose-Capillary: 233 mg/dL — ABNORMAL HIGH (ref 70–99)

## 2020-03-29 LAB — VALPROIC ACID LEVEL: Valproic Acid Lvl: 34 ug/mL — ABNORMAL LOW (ref 50.0–100.0)

## 2020-03-29 LAB — RESPIRATORY PANEL BY RT PCR (FLU A&B, COVID)
Influenza A by PCR: NEGATIVE
Influenza B by PCR: NEGATIVE
SARS Coronavirus 2 by RT PCR: NEGATIVE

## 2020-03-29 LAB — ACETAMINOPHEN LEVEL: Acetaminophen (Tylenol), Serum: <10 ug/mL — ABNORMAL LOW (ref 10–30)

## 2020-03-29 MED ORDER — GABAPENTIN 400 MG PO CAPS
400.0000 mg | ORAL_CAPSULE | Freq: Three times a day (TID) | ORAL | Status: DC
  Administered 2020-03-29 – 2020-03-31 (×6): 400 mg via ORAL
  Filled 2020-03-29 (×6): qty 1

## 2020-03-29 MED ORDER — OLANZAPINE 5 MG PO TABS
10.0000 mg | ORAL_TABLET | Freq: Every day | ORAL | Status: DC
  Administered 2020-03-29: 10 mg via ORAL
  Filled 2020-03-29: qty 2

## 2020-03-29 MED ORDER — INSULIN GLARGINE 100 UNIT/ML SOLOSTAR PEN: 15.0000 [IU] | PEN_INJECTOR | Freq: Every day | SUBCUTANEOUS | Status: DC

## 2020-03-29 MED ORDER — HYDROXYZINE HCL 25 MG PO TABS
25.0000 mg | ORAL_TABLET | Freq: Two times a day (BID) | ORAL | Status: DC | PRN
  Administered 2020-03-30 – 2020-03-31 (×2): 25 mg via ORAL
  Filled 2020-03-29 (×2): qty 1

## 2020-03-29 MED ORDER — MELATONIN 5 MG PO TABS
5.0000 mg | ORAL_TABLET | Freq: Every day | ORAL | Status: DC
  Administered 2020-03-29 – 2020-03-30 (×2): 5 mg via ORAL
  Filled 2020-03-29 (×2): qty 1

## 2020-03-29 MED ORDER — ENOXAPARIN SODIUM 40 MG/0.4ML ~~LOC~~ SOLN
40.0000 mg | SUBCUTANEOUS | Status: DC
  Administered 2020-03-29 – 2020-03-30 (×2): 40 mg via SUBCUTANEOUS
  Filled 2020-03-29 (×2): qty 0.4

## 2020-03-29 MED ORDER — ALBUTEROL SULFATE HFA 108 (90 BASE) MCG/ACT IN AERS
1.0000 | INHALATION_SPRAY | Freq: Four times a day (QID) | RESPIRATORY_TRACT | Status: DC | PRN
  Administered 2020-03-31: 2 via RESPIRATORY_TRACT
  Filled 2020-03-29: qty 6.7

## 2020-03-29 MED ORDER — FERROUS SULFATE 325 (65 FE) MG PO TABS
325.0000 mg | ORAL_TABLET | ORAL | Status: DC
  Administered 2020-03-29 – 2020-03-31 (×2): 325 mg via ORAL
  Filled 2020-03-29 (×2): qty 1

## 2020-03-29 MED ORDER — PANTOPRAZOLE SODIUM 40 MG PO TBEC
40.0000 mg | DELAYED_RELEASE_TABLET | Freq: Every day | ORAL | Status: DC
  Administered 2020-03-29 – 2020-03-31 (×3): 40 mg via ORAL
  Filled 2020-03-29 (×3): qty 1

## 2020-03-29 MED ORDER — SODIUM CHLORIDE 0.9 % IV SOLN: Freq: Once | INTRAVENOUS | Status: AC

## 2020-03-29 MED ORDER — DIVALPROEX SODIUM 250 MG PO DR TAB
2000.0000 mg | DELAYED_RELEASE_TABLET | Freq: Every evening | ORAL | Status: DC
  Administered 2020-03-29 – 2020-03-30 (×2): 2000 mg via ORAL
  Filled 2020-03-29 (×2): qty 8

## 2020-03-29 MED ORDER — INSULIN ASPART 100 UNIT/ML ~~LOC~~ SOLN
0.0000 [IU] | Freq: Every day | SUBCUTANEOUS | Status: DC
  Administered 2020-03-29 – 2020-03-30 (×2): 2 [IU] via SUBCUTANEOUS
  Filled 2020-03-29: qty 0.05

## 2020-03-29 MED ORDER — BENZTROPINE MESYLATE 0.5 MG PO TABS
3.0000 mg | ORAL_TABLET | Freq: Two times a day (BID) | ORAL | Status: DC
  Administered 2020-03-29 – 2020-03-31 (×4): 3 mg via ORAL
  Filled 2020-03-29 (×4): qty 6

## 2020-03-29 MED ORDER — SODIUM ZIRCONIUM CYCLOSILICATE 10 G PO PACK
10.0000 g | PACK | Freq: Once | ORAL | Status: AC
  Administered 2020-03-29: 10 g via ORAL
  Filled 2020-03-29: qty 1

## 2020-03-29 MED ORDER — ACETAMINOPHEN 650 MG RE SUPP: 650.0000 mg | Freq: Four times a day (QID) | RECTAL | Status: DC | PRN

## 2020-03-29 MED ORDER — AMLODIPINE BESYLATE 10 MG PO TABS
10.0000 mg | ORAL_TABLET | Freq: Every day | ORAL | Status: DC
  Administered 2020-03-29 – 2020-03-31 (×3): 10 mg via ORAL
  Filled 2020-03-29 (×3): qty 1

## 2020-03-29 MED ORDER — INSULIN GLARGINE 100 UNIT/ML ~~LOC~~ SOLN
15.0000 [IU] | Freq: Every day | SUBCUTANEOUS | Status: DC
  Administered 2020-03-29 – 2020-03-30 (×2): 15 [IU] via SUBCUTANEOUS
  Filled 2020-03-29 (×3): qty 0.15

## 2020-03-29 MED ORDER — ACETAMINOPHEN 325 MG PO TABS: 650.0000 mg | ORAL_TABLET | Freq: Four times a day (QID) | ORAL | Status: DC | PRN

## 2020-03-29 MED ORDER — DESMOPRESSIN ACETATE 0.2 MG PO TABS
0.2000 mg | ORAL_TABLET | Freq: Every day | ORAL | Status: DC
  Administered 2020-03-29: 0.2 mg via ORAL
  Filled 2020-03-29 (×3): qty 1

## 2020-03-29 MED ORDER — DIVALPROEX SODIUM 250 MG PO DR TAB
500.0000 mg | DELAYED_RELEASE_TABLET | Freq: Every morning | ORAL | Status: DC
  Administered 2020-03-30 – 2020-03-31 (×2): 500 mg via ORAL
  Filled 2020-03-29 (×2): qty 2

## 2020-03-29 MED ORDER — INSULIN ASPART 100 UNIT/ML ~~LOC~~ SOLN
0.0000 [IU] | Freq: Three times a day (TID) | SUBCUTANEOUS | Status: DC
  Administered 2020-03-30: 5 [IU] via SUBCUTANEOUS
  Administered 2020-03-30: 2 [IU] via SUBCUTANEOUS
  Administered 2020-03-31: 3 [IU] via SUBCUTANEOUS
  Administered 2020-03-31: 5 [IU] via SUBCUTANEOUS
  Filled 2020-03-29: qty 0.15

## 2020-03-29 NOTE — ED Triage Notes (Signed)
Pt arrived via police custody, IVC, per IVC, pt has been hallucinating. Hallucinations caused pt to almost fall of balcony. Pt has been verbally aggressive with hotel staff where he is saying. Has been seeing animals that arnt there.

## 2020-03-29 NOTE — H&P (Signed)
History and Physical    Dylan Jordan DGU:[id number] DOB: Apr 14, 1956 DOA: 03/29/2020  PCP: Katha Cabal, DO  Patient coming from: A hotel room  I have personally briefly reviewed patient's old medical records available.  Patient is poor historian.  He has been to the hospital multiple times.  I reviewed his previous hospitalizations, psychiatric notes.  Chief Complaint: "Someone called the police on me today morning"  HPI: Dylan Jordan is a 64 y.o. male with medical history significant of Addison's anemia, type 2 diabetes on insulin, hyperlipidemia, hypertension, recurrent hyperkalemia and schizophrenia who lives in a community with outpatient support brought to the ER IVC by police for noted hallucination, verbally aggressive to the hotel staff where he lives.  People also complained that he is seeing animals that are not present in the hotel room. On further questioning, patient states that he takes too many medications and he has to start cutting down on some of these.  He can clearly state that he takes insulin with the use of insulin pen.  He says he takes many mental health medications.   ED Course: Hemodynamically stable in the ER.  Serological exam showed potassium 6.7, repeat potassium 6.2 with no EKG changes.  Chest x-ray is normal.  COVID-19 negative.  Afebrile. Patient was given a dose of Lokelma and admission advised because of potassium more than 6 as well patient is IVC by police department.  Review of Systems:  Difficult to gather information from this patient. He denies any dizziness headache lightheadedness.  He denies any chest pain or shortness of breath.  Denies any loss of appetite, abdominal pain.  Denies any bowel or bladder changes.  Denies any localized weakness of the extremities. On further psychiatric evaluation, He denies any delusions.  Denies to me hearing any voices.  "Only voices I am hearing is your nurses charting outside the room" He denies any  suicidal ideation. When asked if he has any harmful thoughts about others, he states to me that he wants to hit on "Dylan Jordan and repeated this multiple times.  His speech is difficult to understand, however he repeats this name.   Past Medical History:  Diagnosis Date  . Anemia   . Asthma   . Diabetes mellitus without complication (HCC)   . Gout   . Hyperlipidemia   . Hyperosmolar hyperglycemic coma due to diabetes mellitus without ketoacidosis (HCC)   . Hypertension   . Schizophrenia (HCC) 07/06/2018    Past Surgical History:  Procedure Laterality Date  . REPLACEMENT TOTAL KNEE Left   . TOE SURGERY       reports that he has been smoking cigarettes. He started smoking about 48 years ago. He has a 13.50 pack-year smoking history. He has never used smokeless tobacco. He reports that he does not drink alcohol and does not use drugs.  Allergies  Allergen Reactions  . Chantix [Varenicline Tartrate] Other (See Comments)    Nightmares  . Ativan [Lorazepam] Other (See Comments)    Agitation (and "5 others like it") Will need to call [facility]. [facility] [place] (phone) [phone] and [phone] to fax a release-   . Shellfish-Derived Products Itching  . Penicillins Diarrhea    Family History  Adopted: Yes  Problem Relation Age of Onset  . Allergic rhinitis Neg Hx   . Angioedema Neg Hx   . Asthma Neg Hx   . Atopy Neg Hx   . Eczema Neg Hx   . Immunodeficiency Neg Hx   .  Urticaria Neg Hx      Prior to Admission medications   Medication Sig Start Date End Date Taking? Authorizing Provider  albuterol (PROVENTIL HFA;VENTOLIN HFA) 108 (90 Base) MCG/ACT inhaler Inhale 1-2 puffs into the lungs every 6 (six) hours as needed for wheezing or shortness of breath. 03/25/18   Bobbitt, Heywood Ilesalph Carter, MD  amLODipine (NORVASC) 10 MG tablet Take 10 mg by mouth daily.    [provider]  benztropine (COGENTIN) 1 MG tablet Take 1 tablet (1 mg total) by mouth  daily. Patient taking differently: Take 3 mg by mouth 2 (two) times daily.  03/28/19   Dollene ClevelandAnderson, Hannah C, DO  Cholecalciferol 50 MCG (2000 UT) CAPS Take 1 capsule by mouth daily.    [provider]  cyclobenzaprine (FLEXERIL) 5 MG tablet Take 5 mg by mouth every 12 (twelve) hours as needed for muscle spasms.    [provider]  desmopressin (DDAVP) 0.2 MG tablet Take 0.2 mg by mouth at bedtime.    [provider]  divalproex (DEPAKOTE) 500 MG DR tablet Take 1 tablet (500 mg total) by mouth every morning. 03/29/19   Peggyann ShoalsAnderson, Hannah C, DO  divalproex (DEPAKOTE) 500 MG DR tablet Take 4 tablets (2,000 mg total) by mouth every evening. 03/28/19   Peggyann ShoalsAnderson, Hannah C, DO  ferrous sulfate 325 (65 FE) MG EC tablet Take 1 tablet (325 mg total) by mouth every other day. 07/14/19   Shirley, [place], DO  gabapentin (NEURONTIN) 400 MG capsule Take 400 mg by mouth 3 (three) times daily.    [provider]  haloperidol decanoate (HALDOL DECANOATE) 100 MG/ML injection Inject 200 mg into the muscle every 21 ( twenty-one) days.    [provider]  hydrOXYzine (ATARAX/VISTARIL) 25 MG tablet Take 1 tablet (25 mg total) by mouth 2 (two) times daily as needed for anxiety. 03/28/19   Dollene ClevelandAnderson, Hannah C, DO  Insulin Glargine (LANTUS SOLOSTAR) 100 UNIT/ML Solostar Pen Inject 35 Units into the skin daily. Patient taking differently: Inject 15 Units into the skin at bedtime.  03/28/19   Dollene ClevelandAnderson, Hannah C, DO  insulin lispro (HUMALOG) 100 UNIT/ML injection Inject 0-10 Units into the skin in the morning and at bedtime. Glucose 70-199, 0 units 200-249, 2 units 250-299, 4 units 300-349, 6 units 350-399, 8 units 400-449, 10 units > 450 Call MD 02/18/19   [provider]  LITHIUM CARBONATE ER PO Take 450 mg by mouth at bedtime.    [provider]  melatonin 5 MG TABS Take 5 mg by mouth at bedtime.    [provider]  OLANZapine (ZYPREXA) 10 MG tablet Take  10 mg by mouth at bedtime.    [provider]  Omega-3 Fatty Acids (FISH OIL OMEGA-3) 1000 MG CAPS Take 2 capsules by mouth in the morning and at bedtime.    [provider]  omeprazole (PRILOSEC) 20 MG capsule Take 20 mg by mouth 2 (two) times daily before a meal.    [provider]  polyethylene glycol (MIRALAX / GLYCOLAX) 17 g packet Take 17 g by mouth daily as needed. 03/28/19   Dollene ClevelandAnderson, Hannah C, DO  sodium zirconium cyclosilicate (LOKELMA) 10 g PACK packet Take 10 g by mouth.    [provider]    Physical Exam: Vitals:   03/29/20 1630 03/29/20 1645 03/29/20 1700 03/29/20 1715  BP: 119/72 106/76 100/78 124/77  Pulse: 65 65 63 64  Resp: 19 16 18 20   Temp:  TempSrc:      SpO2: 97% 99% 97% 99%    Constitutional: NAD, calm, comfortable Vitals:   03/29/20 1630 03/29/20 1645 03/29/20 1700 03/29/20 1715  BP: 119/72 106/76 100/78 124/77  Pulse: 65 65 63 64  Resp: 19 16 18 20   Temp:      TempSrc:      SpO2: 97% 99% 97% 99%   Eyes: PERRL, lids and conjunctivae normal Mucous membranes are dry. ENMT: Posterior pharynx clear of any exudate or lesions.poor dentition. Neck: normal, supple, no masses, no thyromegaly Respiratory: clear to auscultation bilaterally, no wheezing, no crackles. Normal respiratory effort. No accessory muscle use.  Cardiovascular: Regular rate and rhythm, no murmurs / rubs / gallops. No extremity edema. 2+ pedal pulses. No carotid bruits.  Abdomen: no tenderness, no masses palpated. No hepatosplenomegaly. Bowel sounds positive.  Musculoskeletal: no clubbing / cyanosis. No joint deformity upper and lower extremities. Good ROM, no contractures. Normal muscle tone.  Skin: no rashes, lesions, ulcers. No induration Neurologic: CN 2-12 grossly intact. Sensation intact, DTR normal. Strength 5/5 in all 4.  Psychiatric:  Pressured speech.  Alert oriented x2.  Denies delusions or hallucinations.  Expresses homicidal ideation.   Denies any suicidal ideation.   Labs on Admission: I have personally reviewed following labs and imaging studies  CBC: Recent Labs  Lab 03/29/20 1345  WBC 6.4  NEUTROABS 3.5  HGB 11.3*  HCT 36.7*  MCV 92.2  PLT 251   Basic Metabolic Panel: Recent Labs  Lab 03/29/20 1345 03/29/20 1600  NA 138  --   K 6.7* 6.2*  CL 110  --   CO2 22  --   GLUCOSE 139*  --   BUN 27*  --   CREATININE 1.51*  --   CALCIUM 9.3  --    GFR: CrCl cannot be calculated (Unknown ideal weight.). Liver Function Tests: Recent Labs  Lab 03/29/20 1345  AST 12*  ALT 14  ALKPHOS 53  BILITOT 0.5  PROT 7.4  ALBUMIN 3.7   No results for input(s): LIPASE, AMYLASE in the last 168 hours. No results for input(s): AMMONIA in the last 168 hours. Coagulation Profile: No results for input(s): INR, PROTIME in the last 168 hours. Cardiac Enzymes: No results for input(s): CKTOTAL, CKMB, CKMBINDEX, TROPONINI in the last 168 hours. BNP (last 3 results) No results for input(s): PROBNP in the last 8760 hours. HbA1C: No results for input(s): HGBA1C in the last 72 hours. CBG: Recent Labs  Lab 03/29/20 1438  GLUCAP 96   Lipid Profile: No results for input(s): CHOL, HDL, LDLCALC, TRIG, CHOLHDL, LDLDIRECT in the last 72 hours. Thyroid Function Tests: No results for input(s): TSH, T4TOTAL, FREET4, T3FREE, THYROIDAB in the last 72 hours. Anemia Panel: No results for input(s): VITAMINB12, FOLATE, FERRITIN, TIBC, IRON, RETICCTPCT in the last 72 hours. Urine analysis:    Component Value Date/Time   COLORURINE STRAW (A) 02/20/2019 1622   APPEARANCEUR CLEAR 02/20/2019 1622   LABSPEC 1.004 (L) 02/20/2019 1622   PHURINE 7.0 02/20/2019 1622   GLUCOSEU NEGATIVE 02/20/2019 1622   HGBUR NEGATIVE 02/20/2019 1622   BILIRUBINUR NEGATIVE 02/20/2019 1622   BILIRUBINUR negative 01/03/2018 1445   KETONESUR NEGATIVE 02/20/2019 1622   PROTEINUR NEGATIVE 02/20/2019 1622   UROBILINOGEN 0.2 01/03/2018 1445   NITRITE  NEGATIVE 02/20/2019 1622   LEUKOCYTESUR NEGATIVE 02/20/2019 1622    Radiological Exams on Admission: DG Chest Port 1 View  Result Date: 03/29/2020 CLINICAL DATA:  Hallucinations. EXAM: PORTABLE CHEST 1 VIEW COMPARISON:  Radiographs 12/25/2018 and 12/24/2018.  CT 05/21/2017. FINDINGS: 1400 hours. The heart size and mediastinal contours are normal. The lungs are clear. There is no pleural effusion or pneumothorax. No acute osseous findings are identified. IMPRESSION: No active cardiopulmonary process. Electronically Signed   By: Carey Bullocks M.D.   On: 03/29/2020 14:32    EKG: Independently reviewed.  Low voltage EKG.  QTC 405.  Assessment/Plan Principal Problem:   Hyperkalemia, diminished renal excretion Active Problems:   Type 2 diabetes mellitus with diabetic neuropathy (HCC)   Iron deficiency anemia   Chronic kidney disease   HTN (hypertension)   Schizoaffective disorder, bipolar type (HCC)     1.  Hyperkalemia, acute kidney injury with history of chronic kidney disease stage IIIa: Probably due to dehydration and ongoing use of losartan.  Patient does have history of recurrent hyperkalemia.  Will need to avoid ACE or ARB. He does not have any hemodynamic or EKG changes.  Repeat potassium was 6.2.  Corresponds to the historic levels. Given a dose of Lokelma.  Will treat with IV fluids and recheck levels tomorrow morning.  2.  Schizoaffective disorder/bipolar disorder/acute exacerbation of schizophrenia with questionable homicidal ideation: Continue safety precautions.  One-to-one observation. Continue IVC.  Cannot leave the hospital. Psychiatry consultation. Patient is on multiple medications, unknown which one he is taking.  Will check valproic acid levels and lithium levels. We will resume patient's nighttime Zyprexa Resume patient's valproic acid.  We will hold off on other medications. Psychiatry to evaluate for further medication adjustment.  3.  Type 2 diabetes with  diabetic neuropathy, insulin-dependent diabetes: Blood sugars well controlled.  Looks like he may be taking his insulin as he describes it. Resume low-dose of long-acting insulin and sliding scale insulin.  4.  Hypertension: Blood pressures are fairly stable.  Given recurrent hyperkalemia and noncompliance he should never be on ACE or ARB.  Continue amlodipine.  Will add other agent if needed.  We will hydrate him overnight.  Keep him on suicidal/homicidal precautions.  Continue IVC. Recheck potassium in the morning.  If less than 6, will be medically clear to go to psych if deemed necessary by psychiatrist.  DVT prophylaxis: Lovenox subcu Code Status: Full code Family Communication: None.  No numbers available. Disposition Plan: Unknown.  Possible inpatient psych Consults called: Psychiatry Admission status: Observation, cardiac telemetry for high potassium.   Dorcas Carrow MD [facility] Pager [phone]

## 2020-03-29 NOTE — ED Provider Notes (Signed)
Patient seen after prior ED provider.  Patient with hyperkalemia without EKG change noted on his initial screening labs.  Patient's case discussed with admitting hospitalist service who will evaluate for admission.     Wynetta Fines, MD 03/29/20 [phone]

## 2020-03-29 NOTE — ED Provider Notes (Signed)
[place]PT Provider Note   CSN: [id number] Arrival date & time: 03/29/20  1303     History Chief Complaint  Patient presents with  . IVC    Dylan Jordan is a 64 y.o. male.  64 yo M with a chief complaints of refusing to get his Invega injection.  This been an ongoing issue for the past couple months.  Now having some bizarre behavior and some hallucinations.  Brought to the ED for evaluation.  Under IVC by his facility.  The history is provided by the patient and the police.  Illness Severity:  Moderate Onset quality:  Gradual Duration:  2 months Progression:  Waxing and waning Chronicity:  Recurrent      Past Medical History:  Diagnosis Date  . Anemia   . Asthma   . Diabetes mellitus without complication (HCC)   . Gout   . Hyperlipidemia   . Hyperosmolar hyperglycemic coma due to diabetes mellitus without ketoacidosis (HCC)   . Hypertension   . Schizophrenia (HCC) 07/06/2018    Patient Active Problem List   Diagnosis Date Noted  . Hyperkalemia   . Schizoaffective disorder, bipolar type (HCC) 02/21/2019  . Hallucinations   . Anxiety 09/18/2018  . Swelling of both lower extremities 09/13/2018  . Type 2 diabetes mellitus without complication, with long-term current use of insulin (HCC)   . Anemia   . Hyperlipidemia 12/18/2017  . 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 Intermittent chest pain 11/25/2017  . Allergic rhinitis with a nonallergic component 07/30/2017  . Food allergy 07/30/2017  . Chronic gout involving toe of left foot without tophus 05/18/2017  . Hemorrhoids 05/04/2017  . Haloperidol adverse reaction 04/16/2017  . Chronic low back pain 01/03/2017  . Chronic cough 06/30/2016  . Drug-induced mood disorder (HCC) 06/14/2016  . Type 2 diabetes mellitus with diabetic neuropathy (HCC) 05/26/2016  . Iron deficiency anemia 05/26/2016  . Chronic kidney disease 05/26/2016  . HTN (hypertension)  05/26/2016  . Tobacco abuse 05/26/2016  . History of substance abuse (HCC) 05/26/2016  . History of alcohol abuse 05/26/2016  . Mild persistent asthma 05/26/2016    Past Surgical History:  Procedure Laterality Date  . REPLACEMENT TOTAL KNEE Left   . TOE SURGERY         Family History  Adopted: Yes  Problem Relation Age of Onset  . Allergic rhinitis Neg Hx   . Angioedema Neg Hx   . Asthma Neg Hx   . Atopy Neg Hx   . Eczema Neg Hx   . Immunodeficiency Neg Hx   . Urticaria Neg Hx     Social History   Tobacco Use  . Smoking status: Current Some Day Smoker    Packs/day: 0.30    Years: 45.00    Pack years: 13.50    Types: Cigarettes    Start date: 05/16/1971  . Smokeless tobacco: Never Used  . Tobacco comment: Decreased Intake to 10 cigarettes per week or less.  Vaping Use  . Vaping Use: Never used  Substance Use Topics  . Alcohol use: No  . Drug use: No    Home Medications Prior to Admission medications   Medication Sig Start Date End Date Taking? Authorizing Provider  albuterol (PROVENTIL HFA;VENTOLIN HFA) 108 (90 Base) MCG/ACT inhaler Inhale 1-2 puffs into the lungs every 6 (six) hours as needed for wheezing or shortness of breath. 03/25/18   Bobbitt, Heywood Ilesalph Carter, MD  amLODipine (NORVASC) 10 MG tablet Take 10  mg by mouth daily.    [provider]  benztropine (COGENTIN) 1 MG tablet Take 1 tablet (1 mg total) by mouth daily. Patient taking differently: Take 3 mg by mouth 2 (two) times daily.  03/28/19   Dollene Cleveland, DO  Cholecalciferol 50 MCG (2000 UT) CAPS Take 1 capsule by mouth daily.    [provider]  cyclobenzaprine (FLEXERIL) 5 MG tablet Take 5 mg by mouth every 12 (twelve) hours as needed for muscle spasms.    [provider]  desmopressin (DDAVP) 0.2 MG tablet Take 0.2 mg by mouth at bedtime.    [provider]  divalproex (DEPAKOTE) 500 MG DR tablet Take 1 tablet (500 mg total) by mouth every morning. 03/29/19    Peggyann Shoals C, DO  divalproex (DEPAKOTE) 500 MG DR tablet Take 4 tablets (2,000 mg total) by mouth every evening. 03/28/19   Peggyann Shoals C, DO  ferrous sulfate 325 (65 FE) MG EC tablet Take 1 tablet (325 mg total) by mouth every other day. 07/14/19   Shirley, [place], DO  gabapentin (NEURONTIN) 400 MG capsule Take 400 mg by mouth 3 (three) times daily.    [provider]  haloperidol decanoate (HALDOL DECANOATE) 100 MG/ML injection Inject 200 mg into the muscle every 21 ( twenty-one) days.    [provider]  hydrOXYzine (ATARAX/VISTARIL) 25 MG tablet Take 1 tablet (25 mg total) by mouth 2 (two) times daily as needed for anxiety. 03/28/19   Dollene Cleveland, DO  Insulin Glargine (LANTUS SOLOSTAR) 100 UNIT/ML Solostar Pen Inject 35 Units into the skin daily. Patient taking differently: Inject 15 Units into the skin at bedtime.  03/28/19   Dollene Cleveland, DO  insulin lispro (HUMALOG) 100 UNIT/ML injection Inject 0-10 Units into the skin in the morning and at bedtime. Glucose 70-199, 0 units 200-249, 2 units 250-299, 4 units 300-349, 6 units 350-399, 8 units 400-449, 10 units > 450 Call MD 02/18/19   [provider]  LITHIUM CARBONATE ER PO Take 450 mg by mouth at bedtime.    [provider]  melatonin 5 MG TABS Take 5 mg by mouth at bedtime.    [provider]  OLANZapine (ZYPREXA) 10 MG tablet Take 10 mg by mouth at bedtime.    [provider]  Omega-3 Fatty Acids (FISH OIL OMEGA-3) 1000 MG CAPS Take 2 capsules by mouth in the morning and at bedtime.    [provider]  omeprazole (PRILOSEC) 20 MG capsule Take 20 mg by mouth 2 (two) times daily before a meal.    [provider]  polyethylene glycol (MIRALAX / GLYCOLAX) 17 g packet Take 17 g by mouth daily as needed. 03/28/19   Dollene Cleveland, DO  sodium zirconium cyclosilicate (LOKELMA) 10 g PACK packet Take 10 g by mouth.    [provider]   valsartan (DIOVAN) 40 MG tablet Take 20 mg by mouth daily.    [provider]    Allergies    Chantix [varenicline tartrate], Ativan [lorazepam], Shellfish-derived products, and Penicillins  Review of Systems   Review of Systems  Unable to perform ROS: Psychiatric disorder    Physical Exam Updated Vital Signs BP 111/73 (BP Location: Left Arm)   Pulse 84   Temp 98.4 F (36.9 C) (Oral)   Resp 18   SpO2 97%   Physical Exam Vitals and nursing note reviewed.  Constitutional:      Appearance: He is well-developed.  HENT:  Head: Normocephalic and atraumatic.  Eyes:     Pupils: Pupils are equal, round, and reactive to light.  Neck:     Vascular: No JVD.  Cardiovascular:     Rate and Rhythm: Normal rate and regular rhythm.     Heart sounds: No murmur heard.  No friction rub. No gallop.   Pulmonary:     Effort: No respiratory distress.     Breath sounds: No wheezing.  Abdominal:     General: There is no distension.     Tenderness: There is no abdominal tenderness. There is no guarding or rebound.  Musculoskeletal:        General: Normal range of motion.     Cervical back: Normal range of motion and neck supple.  Skin:    Coloration: Skin is not pale.     Findings: No rash.  Neurological:     Mental Status: He is alert.  Psychiatric:        Mood and Affect: Affect is flat.        Speech: Speech is rapid and pressured.        Behavior: Behavior normal.     ED Results / Procedures / Treatments   Labs (all labs ordered are listed, but only abnormal results are displayed) Labs Reviewed  COMPREHENSIVE METABOLIC PANEL - Abnormal; Notable for the following components:      Result Value   Potassium 6.7 (*)    Glucose, Bld 139 (*)    BUN 27 (*)    Creatinine, Ser 1.51 (*)    AST 12 (*)    GFR, Estimated 52 (*)    All other components within normal limits  CBC WITH DIFFERENTIAL/PLATELET - Abnormal; Notable for the following components:   RBC 3.98 (*)     Hemoglobin 11.3 (*)    HCT 36.7 (*)    All other components within normal limits  ACETAMINOPHEN LEVEL - Abnormal; Notable for the following components:   Acetaminophen (Tylenol), Serum <10 (*)    All other components within normal limits  SALICYLATE LEVEL - Abnormal; Notable for the following components:   Salicylate Lvl <7.0 (*)    All other components within normal limits  RESPIRATORY PANEL BY RT PCR (FLU A&B, COVID)  ETHANOL  RAPID URINE DRUG SCREEN, HOSP PERFORMED  POTASSIUM  CBG MONITORING, ED  TROPONIN I (HIGH SENSITIVITY)    EKG EKG Interpretation  Date/Time:  Monday March 29 2020 15:02:40 EST Ventricular Rate:  71 PR Interval:    QRS Duration: 86 QT Interval:  372 QTC Calculation: 405 R Axis:   2 Text Interpretation: Sinus rhythm Low voltage, precordial leads 12 Lead; [place]ar No significant change since last tracing Confirmed by Melene Plan [phone]) on 03/29/2020 3:14:55 PM   Radiology DG Chest Port 1 View  Result Date: 03/29/2020 CLINICAL DATA:  Hallucinations. EXAM: PORTABLE CHEST 1 VIEW COMPARISON:  Radiographs 12/25/2018 and 12/24/2018.  CT 05/21/2017. FINDINGS: 1400 hours. The heart size and mediastinal contours are normal. The lungs are clear. There is no pleural effusion or pneumothorax. No acute osseous findings are identified. IMPRESSION: No active cardiopulmonary process. Electronically Signed   By: Carey Bullocks M.D.   On: 03/29/2020 14:32    Procedures Procedures (including critical care time)  Medications Ordered in ED Medications  sodium zirconium cyclosilicate (LOKELMA) packet 10 g (has no administration in time range)    ED Course  I have reviewed the triage vital signs and the nursing notes.  Pertinent labs & imaging results that  were available during my care of the patient were reviewed by me and considered in my medical decision making (see chart for details).    MDM Rules/Calculators/A&P                          64 yo M with a  past medical history of schizophrenia comes in with a chief complaint of refusing to take his Hinda Glatter over the past few months and now having hallucinations and bizarre behavior.  He is currently under IVC and was brought in by the police.  He is a bit scattered on exam and has difficulty expressing his exact issues.  He does not one-point mention chest pain.  Will obtain a single troponin.  States he has been coughing.  Chest x-ray.  Blood work.  Reassess.  Patient's potassium is elevated at 6.7.  No hemolysis was noted by the lab.  His renal function is at baseline.  EKG without change.  We will give a dose of Lokelma now.  Recheck potassium.  If recheck consistent with the first likely will need admission.  Signed out to Dr. Rodena Medin.  Please see his note for further details care in the ED.  The patients results and plan were reviewed and discussed.   Any x-rays performed were independently reviewed by myself.   Differential diagnosis were considered with the presenting HPI.  Medications  sodium zirconium cyclosilicate (LOKELMA) packet 10 g (has no administration in time range)    Vitals:   03/29/20 1310  BP: 111/73  Pulse: 84  Resp: 18  Temp: 98.4 F (36.9 C)  TempSrc: Oral  SpO2: 97%    Final diagnoses:  Paranoid schizophrenia [facility])     Final Clinical Impression(s) / ED Diagnoses Final diagnoses:  Paranoid schizophrenia [facility])    Rx / DC Orders ED Discharge Orders    None       Melene Plan, DO 03/29/20 1528

## 2020-03-30 ENCOUNTER — Other Ambulatory Visit: Payer: Self-pay

## 2020-03-30 DIAGNOSIS — E875 Hyperkalemia: Secondary | ICD-10-CM | POA: Diagnosis not present

## 2020-03-30 LAB — BASIC METABOLIC PANEL
Anion gap: 6 (ref 5–15)
BUN: 27 mg/dL — ABNORMAL HIGH (ref 8–23)
CO2: 22 mmol/L (ref 22–32)
Calcium: 9 mg/dL (ref 8.9–10.3)
Chloride: 110 mmol/L (ref 98–111)
Creatinine, Ser: 1.46 mg/dL — ABNORMAL HIGH (ref 0.61–1.24)
GFR, Estimated: 54 mL/min — ABNORMAL LOW (ref >60)
Glucose, Bld: 154 mg/dL — ABNORMAL HIGH (ref 70–99)
Potassium: 5.7 mmol/L — ABNORMAL HIGH (ref 3.5–5.1)
Sodium: 138 mmol/L (ref 135–145)

## 2020-03-30 LAB — GLUCOSE, CAPILLARY
Glucose-Capillary: 119 mg/dL — ABNORMAL HIGH (ref 70–99)
Glucose-Capillary: 221 mg/dL — ABNORMAL HIGH (ref 70–99)
Glucose-Capillary: 142 mg/dL — ABNORMAL HIGH (ref 70–99)
Glucose-Capillary: 235 mg/dL — ABNORMAL HIGH (ref 70–99)

## 2020-03-30 MED ORDER — DESMOPRESSIN ACETATE 0.1 MG PO TABS
0.2000 mg | ORAL_TABLET | Freq: Every day | ORAL | Status: DC
  Administered 2020-03-30: 0.2 mg via ORAL
  Filled 2020-03-30 (×2): qty 2

## 2020-03-30 MED ORDER — HALOPERIDOL 5 MG PO TABS
10.0000 mg | ORAL_TABLET | Freq: Two times a day (BID) | ORAL | Status: DC
  Administered 2020-03-30 – 2020-03-31 (×2): 10 mg via ORAL
  Filled 2020-03-30 (×3): qty 2

## 2020-03-30 NOTE — TOC Initial Note (Signed)
Transition of Care [facility]) - Initial/Assessment Note    Patient Details  Name: Dylan Jordan MRN: [id number] Date of Birth: 1955-06-10  Transition of Care [facility]) CM/SW Contact:    Ida Rogue, LCSW Phone Number: 03/30/2020, 3:31 PM  Clinical Narrative:   Patient seen in follow up to psychiatric consult recommending he follow up with his ACT team, and if unable to reach them to follow up with [facility].  [name] greeted me warmly, and went into great detail about circumstances leading up to his stay with [place].  He is focused on getting back to the hotel [he does not know the name of it or where it is other than "[place]," because he is certain he can stay there for 3 more days.  After that he plans on getting into an apartment that he believes the ACT team is lining up for him.  He admits to a diagnosis of "Bipolar Schizophrenic," is able to name the medications he is prescribed, readily admits to declining the Haldol decanoate injection but is adamant he takes all medication PO as prescribed. When asked about symptoms these medications are prescribed for, he denies paranoia, AH or VH.  He exhibits limited insight, and appears to be able to meaningfully plan ahead.  When I asked him about going to a shelter, he physically slumped and became despondent. I spoke with [name] at the [facility] team [phone]. She expressed concerns about [name]'s reluctance to comply with medications, as well as his inability to stay in one place for any extended period of time due to verbal altercations with others. She is hopeful that he can remain in the hospital for awhile.  I explained he is medically stable and would soon be released. Spoke with MD who confirmed plan to release patient tomorrow.  I left a message for [name] letting her know he is being released, and asked her to put him on the schedule for pick up late tomorrow  AM. TOC will continue to follow during the course of hospitalization.                   Expected Discharge Plan: Home/Self Care Barriers to Discharge: No Barriers Identified   Patient Goals and CMS Choice        Expected Discharge Plan and Services Expected Discharge Plan: Home/Self Care       Living arrangements for the past 2 months: No permanent address, Hotel/Motel                                      Prior Living Arrangements/Services Living arrangements for the past 2 months: No permanent address, Hotel/Motel Lives with:: Self Patient language and need for interpreter reviewed:: Yes        Need for Family Participation in Patient Care: No (Comment) Care giver support system in place?: Yes (comment)   Criminal Activity/Legal Involvement Pertinent to Current Situation/Hospitalization: No - Comment as needed  Activities of Daily Living Home Assistive Devices/Equipment: None ADL Screening (condition at time of admission) Patient's cognitive ability adequate to safely complete daily activities?: Yes Is the patient deaf or have difficulty hearing?: No Does the patient have difficulty seeing, even when wearing glasses/contacts?: No Does the patient have difficulty concentrating, remembering, or making decisions?: No Patient able to express need for assistance with ADLs?: Yes Does the patient have difficulty dressing or bathing?: No Independently performs ADLs?: Yes (  appropriate for developmental age) Does the patient have difficulty walking or climbing stairs?: No Weakness of Legs: None Weakness of Arms/Hands: None  Permission Sought/Granted Permission sought to share information with : Psychiatrist Permission granted to share information with : Yes, Verbal Permission Granted  Share Information with NAME: [facility] team           Emotional Assessment Appearance:: Appears stated age Attitude/Demeanor/Rapport: Engaged Affect (typically observed): Appropriate Orientation: : Oriented to Self, Oriented to  Situation Alcohol / Substance Use: Not Applicable Psych Involvement: Yes (comment)  Admission diagnosis:  Hyperkalemia [E87.5] Paranoid schizophrenia (HCC) [F20.0] Hyperkalemia, diminished renal excretion [E87.5] Patient Active Problem List   Diagnosis Date Noted  . Hyperkalemia, diminished renal excretion 03/29/2020  . Hyperkalemia   . Schizoaffective disorder, bipolar type (HCC) 02/21/2019  . Hallucinations   . Anxiety 09/18/2018  . Swelling of both lower extremities 09/13/2018  . Type 2 diabetes mellitus without complication, with long-term current use of insulin (HCC)   . Anemia   . Hyperlipidemia 12/18/2017  . 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 Intermittent chest pain 11/25/2017  . Allergic rhinitis with a nonallergic component 07/30/2017  . Food allergy 07/30/2017  . Chronic gout involving toe of left foot without tophus 05/18/2017  . Hemorrhoids 05/04/2017  . Haloperidol adverse reaction 04/16/2017  . Chronic low back pain 01/03/2017  . Chronic cough 06/30/2016  . Drug-induced mood disorder (HCC) 06/14/2016  . Type 2 diabetes mellitus with diabetic neuropathy (HCC) 05/26/2016  . Iron deficiency anemia 05/26/2016  . Chronic kidney disease 05/26/2016  . HTN (hypertension) 05/26/2016  . Tobacco abuse 05/26/2016  . History of substance abuse (HCC) 05/26/2016  . History of alcohol abuse 05/26/2016  . Mild persistent asthma 05/26/2016   PCP:  Katha Cabal, DO Pharmacy:   [facility]- [place], [place] - [address] [place] [address] Phone: [phone] Fax: [phone]  [facility] - [place], [place] - [address] [place] [address] Phone: [phone] Fax: [phone]  [name] - [place], [place] - [address] [place] [address] Phone: [phone] Fax: [phone]     Social Determinants of  Health (SDOH) Interventions    Readmission Risk Interventions No flowsheet data found.

## 2020-03-30 NOTE — Consult Note (Signed)
[facility] Face-to-Face Psychiatry Consult   Reason for Consult:  Hallucinations  Referring Physician:  Dr Jerral Ralph Patient Identification: Dylan Jordan MRN:  [id number] Principal Diagnosis: hyperkalemia  Diagnosis:  Schizoaffective d/o, bipolar type  Total Time spent with patient: 30 minutes  Subjective:   Dylan Jordan is a 64 y.o. male patient admitted with hyperkalemia.  "I am having hallucinations and I have a history of psych problems. But Im ok and don't need to go into the hospital. I can start my medicine and go back to my apartment. The ACT should be able to help me get back into my [address]. I told the doctor I was going to hurt someone but I was just joking with him. "   Patient was seen and evaluated by this Clinical research associate. He describes his mood "okay" however reports having some visual hallucinations. He is appropriate with his responses and does not appear to be responding to internal stimuli. Patient admits to having chronic hallucinations, and states they had resolved previously with Haldol. Despite being a poor historian he is able to discuss his past psych history and his current medications. He reports receiving his last Haldol Dec shot before from his ACT team member Caryn Bee. He also recommends that this writer increase his Haldol to 15mg  twice a day, because that is what he used to take. He continues to perservate about his needs for housing. He states he had an apartment. He is requesting assistance with housing through his ACT team. When assessing his suicidality, he denies si/hi/avh.   HPI: Dylan Jordan is a 64 y.o. male with medical history significant of Addison's anemia, type 2 diabetes on insulin, hyperlipidemia, hypertension, recurrent hyperkalemia and schizophrenia who lives in a community with outpatient support brought to the ER IVC by police for noted hallucination, verbally aggressive to the hotel staff where he lives.  People also complained that he is seeing animals that are  not present in the hotel room.  On further questioning, patient states that he takes too many medications and he has to start cutting down on some of these.  He can clearly state that he takes insulin with the use of insulin pen.  He says he takes many mental health medications.  Past Psychiatric History: schizophrenia  Risk to Self:  Denies Risk to Others:  Denies Prior Inpatient Therapy:  Multiple admissions Prior Outpatient Therapy:   [facility]  Past Medical History:  Past Medical History:  Diagnosis Date  . Anemia   . Asthma   . Diabetes mellitus without complication (HCC)   . Gout   . Hyperlipidemia   . Hyperosmolar hyperglycemic coma due to diabetes mellitus without ketoacidosis (HCC)   . Hypertension   . Schizophrenia (HCC) 07/06/2018    Past Surgical History:  Procedure Laterality Date  . REPLACEMENT TOTAL KNEE Left   . TOE SURGERY     Family History:  Family History  Adopted: Yes  Problem Relation Age of Onset  . Allergic rhinitis Neg Hx   . Angioedema Neg Hx   . Asthma Neg Hx   . Atopy Neg Hx   . Eczema Neg Hx   . Immunodeficiency Neg Hx   . Urticaria Neg Hx    Family Psychiatric  History: none Social History:  Social History   Substance and Sexual Activity  Alcohol Use No     Social History   Substance and Sexual Activity  Drug Use No    Social History   Socioeconomic History  . Marital  status: Single    Spouse name: Not on file  . Number of children: Not on file  . Years of education: Not on file  . Highest education level: Not on file  Occupational History  . Not on file  Tobacco Use  . Smoking status: Current Some Day Smoker    Packs/day: 0.30    Years: 45.00    Pack years: 13.50    Types: Cigarettes    Start date: 05/16/1971  . Smokeless tobacco: Never Used  . Tobacco comment: Decreased Intake to 10 cigarettes per week or less.  Vaping Use  . Vaping Use: Never used  Substance and Sexual Activity  . Alcohol use: No  .  Drug use: No  . Sexual activity: Not on file  Other Topics Concern  . Not on file  Social History Narrative   Lives by himself at home. Closest family member is niece.       ETOH none   Drugs non e   Current smoker, none for three days    Social Determinants of Health   Financial Resource Strain:   . Difficulty of Paying Living Expenses: Not on file  Food Insecurity:   . Worried About Programme researcher, broadcasting/film/video in the Last Year: Not on file  . Ran Out of Food in the Last Year: Not on file  Transportation Needs:   . Lack of Transportation (Medical): Not on file  . Lack of Transportation (Non-Medical): Not on file  Physical Activity:   . Days of Exercise per Week: Not on file  . Minutes of Exercise per Session: Not on file  Stress:   . Feeling of Stress : Not on file  Social Connections:   . Frequency of Communication with Friends and Family: Not on file  . Frequency of Social Gatherings with Friends and Family: Not on file  . Attends Religious Services: Not on file  . Active Member of Clubs or Organizations: Not on file  . Attends Banker Meetings: Not on file  . Marital Status: Not on file   Additional Social History:    Allergies:   Allergies  Allergen Reactions  . Chantix [Varenicline Tartrate] Other (See Comments)    Nightmares  . Ativan [Lorazepam] Other (See Comments)    Agitation (and "5 others like it") Will need to call [facility] [place] (phone) [phone] and [phone] to fax a release-   . Shellfish-Derived Products Itching  . Penicillins Diarrhea    Labs:  Results for orders placed or performed during the hospital encounter of 03/29/20 (from the past 48 hour(s))  Comprehensive metabolic panel     Status: Abnormal   Collection Time: 03/29/20  1:45 PM  Result Value Ref Range   Sodium 138 135 - 145 mmol/L   Potassium 6.7 (HH) 3.5 - 5.1 mmol/L    Comment: CRITICAL RESULT CALLED TO, READ BACK BY AND VERIFIED WITH: NO VISIBLE  HEMOLYSIS SAVOIE,B. RN @1447  03/29/20 BILLINGSLEY,L    Chloride 110 98 - 111 mmol/L   CO2 22 22 - 32 mmol/L   Glucose, Bld 139 (H) 70 - 99 mg/dL    Comment: Glucose reference range applies only to samples taken after fasting for at least 8 hours.   BUN 27 (H) 8 - 23 mg/dL   Creatinine, Ser 03/31/20 (H) 0.61 - 1.24 mg/dL   Calcium 9.3 8.9 - 6.57 mg/dL   Total Protein 7.4 6.5 - 8.1 g/dL   Albumin 3.7 3.5 - 5.0  g/dL   AST 12 (L) 15 - 41 U/L   ALT 14 0 - 44 U/L   Alkaline Phosphatase 53 38 - 126 U/L   Total Bilirubin 0.5 0.3 - 1.2 mg/dL   GFR, Estimated 52 (L) >60 mL/min    Comment: (NOTE) Calculated using the CKD-EPI Creatinine Equation (2021)    Anion gap 6 5 - 15    Comment: Performed at [facility], [address], [place], [place] [address]  Ethanol     Status: None   Collection Time: 03/29/20  1:45 PM  Result Value Ref Range   Alcohol, Ethyl (B) <10 <10 mg/dL    Comment: (NOTE) Lowest detectable limit for serum alcohol is 10 mg/dL.  For medical purposes only. Performed at [facility], [address] [place], [place] [address]   CBC with Diff     Status: Abnormal   Collection Time: 03/29/20  1:45 PM  Result Value Ref Range   WBC 6.4 4.0 - 10.5 K/uL   RBC 3.98 (L) 4.22 - 5.81 MIL/uL   Hemoglobin 11.3 (L) 13.0 - 17.0 g/dL   HCT 19.1 (L) 39 - 52 %   MCV 92.2 80.0 - 100.0 fL   MCH 28.4 26.0 - 34.0 pg   MCHC 30.8 30.0 - 36.0 g/dL   RDW 47.8 29.5 - 62.1 %   Platelets 251 150 - 400 K/uL   nRBC 0.0 0.0 - 0.2 %   Neutrophils Relative % 55 %   Neutro Abs 3.5 1.7 - 7.7 K/uL   Lymphocytes Relative 35 %   Lymphs Abs 2.2 0.7 - 4.0 K/uL   Monocytes Relative 7 %   Monocytes Absolute 0.5 0.1 - 1.0 K/uL   Eosinophils Relative 2 %   Eosinophils Absolute 0.1 0.0 - 0.5 K/uL   Basophils Relative 1 %   Basophils Absolute 0.0 0.0 - 0.1 K/uL   Immature Granulocytes 0 %   Abs Immature Granulocytes 0.02 0.00 - 0.07 K/uL    Comment: Performed at  [facility], [address], [place], [place] [address]  Acetaminophen level     Status: Abnormal   Collection Time: 03/29/20  1:45 PM  Result Value Ref Range   Acetaminophen (Tylenol), Serum <10 (L) 10 - 30 ug/mL    Comment: (NOTE) Therapeutic concentrations vary significantly. A range of 10-30 ug/mL  may be an effective concentration for many patients. However, some  are best treated at concentrations outside of this range. Acetaminophen concentrations >150 ug/mL at 4 hours after ingestion  and >50 ug/mL at 12 hours after ingestion are often associated with  toxic reactions.  Performed at [facility], [address] [place], [place] [address]   Salicylate level     Status: Abnormal   Collection Time: 03/29/20  1:45 PM  Result Value Ref Range   Salicylate Lvl <7.0 (L) 7.0 - 30.0 mg/dL    Comment: Performed at [facility], [address], [place], [place] [address]  Troponin I (High Sensitivity)     Status: None   Collection Time: 03/29/20  1:45 PM  Result Value Ref Range   Troponin I (High Sensitivity) <2 <18 ng/L    Comment: (NOTE) Elevated high sensitivity troponin I (hsTnI) values and significant  changes across serial measurements may suggest ACS but many other  chronic and acute conditions are known to elevate hsTnI results.  Refer to the Links section for chest pain algorithms and additional  guidance.  Performed at [facility], [address], [place], [place] [address]   Respiratory Panel by RT PCR (Flu A&B, Covid) - Urine, Clean Catch     Status: None   Collection Time: 03/29/20  2:00 PM   Specimen: Urine, Clean Catch; Nasopharyngeal  Result Value Ref Range   SARS Coronavirus 2 by RT PCR NEGATIVE NEGATIVE    Comment: (NOTE) SARS-CoV-2 target nucleic acids are NOT DETECTED.  The SARS-CoV-2 RNA is generally detectable in upper respiratoy specimens during the acute phase of infection. The  lowest concentration of SARS-CoV-2 viral copies this assay can detect is 131 copies/mL. A negative result does not preclude SARS-Cov-2 infection and should not be used as the sole basis for treatment or other patient management decisions. A negative result may occur with  improper specimen collection/handling, submission of specimen other than nasopharyngeal swab, presence of viral mutation(s) within the areas targeted by this assay, and inadequate number of viral copies (<131 copies/mL). A negative result must be combined with clinical observations, patient history, and epidemiological information. The expected result is Negative.  Fact Sheet for Patients:  https://www.moore.com/  Fact Sheet for Healthcare Providers:  https://www.young.biz/  This test is no t yet approved or cleared by the [place] FDA and  has been authorized for detection and/or diagnosis of SARS-CoV-2 by FDA under an Emergency Use Authorization (EUA). This EUA will remain  in effect (meaning this test can be used) for the duration of the COVID-19 declaration under Section 564(b)(1) of the Act, 21 U.S.C. section 360bbb-3(b)(1), unless the authorization is terminated or revoked sooner.     Influenza A by PCR NEGATIVE NEGATIVE   Influenza B by PCR NEGATIVE NEGATIVE    Comment: (NOTE) The Xpert Xpress SARS-CoV-2/FLU/RSV assay is intended as an aid in  the diagnosis of influenza from Nasopharyngeal swab specimens and  should not be used as a sole basis for treatment. Nasal washings and  aspirates are unacceptable for Xpert Xpress SARS-CoV-2/FLU/RSV  testing.  Fact Sheet for Patients: https://www.moore.com/  Fact Sheet for Healthcare Providers: https://www.young.biz/  This test is not yet approved or cleared by the [place] FDA and  has been authorized for detection and/or diagnosis of SARS-CoV-2 by  FDA under an Emergency  Use Authorization (EUA). This EUA will remain  in effect (meaning this test can be used) for the duration of the  Covid-19 declaration under Section 564(b)(1) of the Act, 21  U.S.C. section 360bbb-3(b)(1), unless the authorization is  terminated or revoked. Performed at [facility], [address] [place], [place] [address]   Urine rapid drug screen (hosp performed)     Status: None   Collection Time: 03/29/20  2:00 PM  Result Value Ref Range   Opiates NONE DETECTED NONE DETECTED   Cocaine NONE DETECTED NONE DETECTED   Benzodiazepines NONE DETECTED NONE DETECTED   Amphetamines NONE DETECTED NONE DETECTED   Tetrahydrocannabinol NONE DETECTED NONE DETECTED   Barbiturates NONE DETECTED NONE DETECTED    Comment: (NOTE) DRUG SCREEN FOR MEDICAL PURPOSES ONLY.  IF CONFIRMATION IS NEEDED FOR ANY PURPOSE, NOTIFY LAB WITHIN 5 DAYS.  LOWEST DETECTABLE LIMITS FOR URINE DRUG SCREEN Drug Class                     Cutoff (ng/mL) Amphetamine and metabolites    1000 Barbiturate and metabolites    200 Benzodiazepine                 200 Tricyclics  and metabolites     300 Opiates and metabolites        300 Cocaine and metabolites        300 THC                            50 Performed at [facility], [address] [place], [place] [address]   CBG monitoring, ED     Status: None   Collection Time: 03/29/20  2:38 PM  Result Value Ref Range   Glucose-Capillary 96 70 - 99 mg/dL    Comment: Glucose reference range applies only to samples taken after fasting for at least 8 hours.  Potassium     Status: Abnormal   Collection Time: 03/29/20  4:00 PM  Result Value Ref Range   Potassium 6.2 (H) 3.5 - 5.1 mmol/L    Comment: Performed at [facility], [address] [place], [place] [address]  Lithium level     Status: None   Collection Time: 03/29/20  5:15 PM  Result Value Ref Range   Lithium Lvl 0.94 0.60 - 1.20 mmol/L    Comment:  Performed at [facility], [address] [place], [place] [address]  Valproic acid level     Status: Abnormal   Collection Time: 03/29/20  5:15 PM  Result Value Ref Range   Valproic Acid Lvl 34 (L) 50.0 - 100.0 ug/mL    Comment: Performed at [facility], [address] [place], [place] [address]  Glucose, capillary     Status: Abnormal   Collection Time: 03/29/20  7:35 PM  Result Value Ref Range   Glucose-Capillary 234 (H) 70 - 99 mg/dL    Comment: Glucose reference range applies only to samples taken after fasting for at least 8 hours.  Glucose, capillary     Status: Abnormal   Collection Time: 03/29/20 10:36 PM  Result Value Ref Range   Glucose-Capillary 233 (H) 70 - 99 mg/dL    Comment: Glucose reference range applies only to samples taken after fasting for at least 8 hours.  Basic metabolic panel     Status: Abnormal   Collection Time: 03/30/20  3:50 AM  Result Value Ref Range   Sodium 138 135 - 145 mmol/L   Potassium 5.7 (H) 3.5 - 5.1 mmol/L   Chloride 110 98 - 111 mmol/L   CO2 22 22 - 32 mmol/L   Glucose, Bld 154 (H) 70 - 99 mg/dL    Comment: Glucose reference range applies only to samples taken after fasting for at least 8 hours.   BUN 27 (H) 8 - 23 mg/dL   Creatinine, Ser 2.951.46 (H) 0.61 - 1.24 mg/dL   Calcium 9.0 8.9 - 62.110.3 mg/dL   GFR, Estimated 54 (L) >60 mL/min    Comment: (NOTE) Calculated using the CKD-EPI Creatinine Equation (2021)    Anion gap 6 5 - 15    Comment: Performed at [facility], [address] [place], [place] [address]  Glucose, capillary     Status: Abnormal   Collection Time: 03/30/20  7:46 AM  Result Value Ref Range   Glucose-Capillary 119 (H) 70 - 99 mg/dL    Comment: Glucose reference range applies only to samples taken after fasting for at least 8 hours.  Glucose, capillary     Status: Abnormal   Collection Time: 03/30/20 12:03 PM  Result Value Ref Range   Glucose-Capillary  221 (H)  70 - 99 mg/dL    Comment: Glucose reference range applies only to samples taken after fasting for at least 8 hours.    Current Facility-Administered Medications  Medication Dose Route Frequency Provider Last Rate Last Admin  . acetaminophen (TYLENOL) tablet 650 mg  650 mg Oral Q6H PRN Dorcas Carrow, MD       Or  . acetaminophen (TYLENOL) suppository 650 mg  650 mg Rectal Q6H PRN Dorcas Carrow, MD      . albuterol (VENTOLIN HFA) 108 (90 Base) MCG/ACT inhaler 1-2 puff  1-2 puff Inhalation Q6H PRN Dorcas Carrow, MD      . amLODipine (NORVASC) tablet 10 mg  10 mg Oral Daily Dorcas Carrow, MD   10 mg at 03/30/20 0914  . benztropine (COGENTIN) tablet 3 mg  3 mg Oral BID Dorcas Carrow, MD   3 mg at 03/30/20 0919  . desmopressin (DDAVP) tablet 0.2 mg  0.2 mg Oral QHS Dorcas Carrow, MD   0.2 mg at 03/29/20 2237  . divalproex (DEPAKOTE) DR tablet 2,000 mg  2,000 mg Oral QPM Dorcas Carrow, MD   2,000 mg at 03/29/20 2237  . divalproex (DEPAKOTE) DR tablet 500 mg  500 mg Oral q morning - 10a Dorcas Carrow, MD   500 mg at 03/30/20 0914  . enoxaparin (LOVENOX) injection 40 mg  40 mg Subcutaneous Q24H Dorcas Carrow, MD   40 mg at 03/29/20 2239  . ferrous sulfate tablet 325 mg  325 mg Oral Levy Pupa, MD   325 mg at 03/29/20 2237  . gabapentin (NEURONTIN) capsule 400 mg  400 mg Oral TID Dorcas Carrow, MD   400 mg at 03/30/20 0914  . hydrOXYzine (ATARAX/VISTARIL) tablet 25 mg  25 mg Oral BID PRN Dorcas Carrow, MD      . insulin aspart (novoLOG) injection 0-15 Units  0-15 Units Subcutaneous TID WC Dorcas Carrow, MD   5 Units at 03/30/20 1256  . insulin aspart (novoLOG) injection 0-5 Units  0-5 Units Subcutaneous QHS Dorcas Carrow, MD   2 Units at 03/29/20 2239  . insulin glargine (LANTUS) injection 15 Units  15 Units Subcutaneous QHS Royce Macadamia, RPH   15 Units at 03/29/20 2240  . melatonin tablet 5 mg  5 mg Oral QHS Dorcas Carrow, MD   5 mg at 03/29/20 2238  . OLANZapine (ZYPREXA)  tablet 10 mg  10 mg Oral QHS Dorcas Carrow, MD   10 mg at 03/29/20 2238  . pantoprazole (PROTONIX) EC tablet 40 mg  40 mg Oral Daily Dorcas Carrow, MD   40 mg at 03/30/20 0981    Musculoskeletal: Strength & Muscle Tone: within normal limits Gait & Station: normal Patient leans: N/A  Psychiatric Specialty Exam: Physical Exam Vitals and nursing note reviewed.  Constitutional:      Appearance: He is well-developed.  HENT:     Head: Normocephalic.  Pulmonary:     Effort: Pulmonary effort is normal.  Musculoskeletal:        General: Normal range of motion.     Cervical back: Normal range of motion.  Neurological:     Mental Status: He is alert and oriented to person, place, and time.  Psychiatric:        Mood and Affect: Mood normal. Affect is blunt.        Speech: Speech normal.        Behavior: Behavior normal.        Thought Content: Thought content normal.  Judgment: Judgment normal.     Review of Systems  Psychiatric/Behavioral: Positive for hallucinations.  All other systems reviewed and are negative.   Blood pressure 104/64, pulse 61, temperature (!) 97.5 F (36.4 C), temperature source Oral, resp. rate 19, weight 99.7 kg, SpO2 98 %.Body mass index is 29 kg/m.  General Appearance: Casual  Eye Contact:  Good  Speech:  speech difficulty, normal and comprehensible.   Volume:  Normal  Mood:  Anxious  Affect:  Appropriate and Blunt  Thought Process:  Coherent, Linear and Descriptions of Associations: Intact  Orientation:  Full (Time, Place, and Person)  Thought Content:  Logical, Hallucinations: Visual, Tangential and "they not bad. Haldol helps".   Suicidal Thoughts:  Denies  Homicidal Thoughts:  Denies  Memory:  Immediate;   Good Recent;   Poor Remote;   Poor  Judgement:  Intact  Insight:  Present  Psychomotor Activity:  Normal  Concentration:  Concentration: Good and Attention Span: Good  Recall:  Good  Fund of Knowledge:  Fair  Language:  Fair   Akathisia:  No  Handed:  Right  AIMS (if indicated):     Assets:  Housing Leisure Time Physical Health Resilience  ADL's:  Intact  Cognition:  WNL  Sleep:       Treatment Plan Summary: Schizoaffective disorder, bipolar type: -Continue Depakote 500 mg in the and 2000 mg in the pm -Ordered Valproic acid level, 34, below the normal range.  -We have agreed to resume his home medications Haldol 10mg  po BID, and transition to Haldol Dec. He reports he was receiving medication management through [facility] team. He reports this medication has worked well for him in the past.  He believes we can avoid hospitalization, if we resume his medications.  -Follow up with [facility] for next haldol dec injection   EPS: -Continue Cogentin 3 mg BID  Anxiety: -Continue hydroxyzine 25 mg every six hours PRN  Disposition: No evidence of imminent risk to self or others at present.   . Patient can receive most services outpatient at [facility], and does not require inpatient admission.  WIll continue to monitor since adjusting medication regimen, until medically cleared. Patient may need to go to the [facility] for further medication management, if he is cleared today.   [facility], FNP 03/30/2020 1:17 PM

## 2020-03-30 NOTE — Progress Notes (Signed)
03/30/2020  1730  Patient loss IV access. Notified MD. Per MD no new IV needed.

## 2020-03-30 NOTE — Progress Notes (Signed)
PROGRESS NOTE    [facility]  PZW:[id number] DOB: 1955-07-15 DOA: 03/29/2020 PCP: Katha Cabal, DO    Brief Narrative:  64 year old gentleman with history of type 2 diabetes on insulin, hypertension, hyperlipidemia, recurrent hyperkalemia and schizophrenia brought from hotel room with hallucinations and abnormal behavior.  IVC by police. In the emergency room hemodynamically stable.  Potassium was 6.7, repeat potassium 6.2 hence medical admission.   Assessment & Plan:   Principal Problem:   Hyperkalemia, diminished renal excretion Active Problems:   Type 2 diabetes mellitus with diabetic neuropathy (HCC)   Iron deficiency anemia   Chronic kidney disease   HTN (hypertension)   Schizoaffective disorder, bipolar type (HCC)  Hyperkalemia: Probably due to dehydration and being on losartan.  Discontinue losartan.  Patient is not a good candidate for ACE or ARB for his blood pressures.  Potassium is 5.7 today.  Will need recheck in about 3 days to ensure stabilization.  Recommended to consume low potassium diet. Patient is medically clear to transfer to behavioral health if deemed necessary.  Hypertension: Blood pressures are stable.  He will continue amlodipine only.  Type 2 diabetes with diabetic neuropathy: Patient is on insulin and well controlled.  Continue insulin.  Schizophrenia with exacerbation: Patient on Haldol, lithium and valproic acid.  Followed by psychiatry.  Appreciate their recommendations. Expressed homicidal ideation, continue IVC and safety observation. Psychiatry to decide about further management plan.   DVT prophylaxis: enoxaparin (LOVENOX) injection 40 mg Start: 03/29/20 2200   Code Status: Full code Family Communication: None Disposition Plan: Status is: Observation  The patient will require care spanning > 2 midnights and should be moved to inpatient because: Unsafe d/c plan  Dispo: The patient is from: Home              Anticipated d/c is to:  Inpatient psych              Anticipated d/c date is: 1 day              Patient currently is medically stable to d/c.  Is medically stable only to transfer to inpatient psych level of care.         Consultants:   Psychiatry  Procedures:   None  Antimicrobials:   None   Subjective: Patient seen and examined.  No overnight events.  He just mumbles a few words not very clear.  Noted well-controlled behavior. He asked me how long he is going to be in the hospital.  Objective: Vitals:   03/29/20 2242 03/29/20 2254 03/30/20 0257 03/30/20 0628  BP: 126/87  110/60 104/64  Pulse: 77 80 69 61  Resp:   18 19  Temp:  (!) 97.5 F (36.4 C) 97.6 F (36.4 C) (!) 97.5 F (36.4 C)  TempSrc:  Oral  Oral  SpO2:  100% 93% 98%  Weight:        Intake/Output Summary (Last 24 hours) at 03/30/2020 1437 Last data filed at 03/30/2020 1433 Gross per 24 hour  Intake 708 ml  Output --  Net 708 ml   Filed Weights   03/29/20 2120  Weight: 99.7 kg    Examination:  General exam: Appears calm and comfortable  Looks comfortable on room air. He has some pressured speech otherwise without any distress. Respiratory system: Clear to auscultation. Respiratory effort normal. Cardiovascular system: S1 & S2 heard, RRR. No JVD, murmurs, rubs, gallops or clicks. No pedal edema. Gastrointestinal system: Soft nontender.   Central nervous system: Alert and oriented.  No focal neurological deficits. Extremities: Symmetric 5 x 5 power. Skin: No rashes, lesions or ulcers Psychiatry: Pressured speech.  Not very forthcoming on my interview.    Data Reviewed: I have personally reviewed following labs and imaging studies  CBC: Recent Labs  Lab 03/29/20 1345  WBC 6.4  NEUTROABS 3.5  HGB 11.3*  HCT 36.7*  MCV 92.2  PLT 251   Basic Metabolic Panel: Recent Labs  Lab 03/29/20 1345 03/29/20 1600 03/30/20 0350  NA 138  --  138  K 6.7* 6.2* 5.7*  CL 110  --  110  CO2 22  --  22  GLUCOSE  139*  --  154*  BUN 27*  --  27*  CREATININE 1.51*  --  1.46*  CALCIUM 9.3  --  9.0   GFR: Estimated Creatinine Clearance: 64.3 mL/min (A) (by C-G formula based on SCr of 1.46 mg/dL (H)). Liver Function Tests: Recent Labs  Lab 03/29/20 1345  AST 12*  ALT 14  ALKPHOS 53  BILITOT 0.5  PROT 7.4  ALBUMIN 3.7   No results for input(s): LIPASE, AMYLASE in the last 168 hours. No results for input(s): AMMONIA in the last 168 hours. Coagulation Profile: No results for input(s): INR, PROTIME in the last 168 hours. Cardiac Enzymes: No results for input(s): CKTOTAL, CKMB, CKMBINDEX, TROPONINI in the last 168 hours. BNP (last 3 results) No results for input(s): PROBNP in the last 8760 hours. HbA1C: No results for input(s): HGBA1C in the last 72 hours. CBG: Recent Labs  Lab 03/29/20 1438 03/29/20 1935 03/29/20 2236 03/30/20 0746 03/30/20 1203  GLUCAP 96 234* 233* 119* 221*   Lipid Profile: No results for input(s): CHOL, HDL, LDLCALC, TRIG, CHOLHDL, LDLDIRECT in the last 72 hours. Thyroid Function Tests: No results for input(s): TSH, T4TOTAL, FREET4, T3FREE, THYROIDAB in the last 72 hours. Anemia Panel: No results for input(s): VITAMINB12, FOLATE, FERRITIN, TIBC, IRON, RETICCTPCT in the last 72 hours. Sepsis Labs: No results for input(s): PROCALCITON, LATICACIDVEN in the last 168 hours.  Recent Results (from the past 240 hour(s))  Respiratory Panel by RT PCR (Flu A&B, Covid) - Urine, Clean Catch     Status: None   Collection Time: 03/29/20  2:00 PM   Specimen: Urine, Clean Catch; Nasopharyngeal  Result Value Ref Range Status   SARS Coronavirus 2 by RT PCR NEGATIVE NEGATIVE Final    Comment: (NOTE) SARS-CoV-2 target nucleic acids are NOT DETECTED.  The SARS-CoV-2 RNA is generally detectable in upper respiratoy specimens during the acute phase of infection. The lowest concentration of SARS-CoV-2 viral copies this assay can detect is 131 copies/mL. A negative result does not  preclude SARS-Cov-2 infection and should not be used as the sole basis for treatment or other patient management decisions. A negative result may occur with  improper specimen collection/handling, submission of specimen other than nasopharyngeal swab, presence of viral mutation(s) within the areas targeted by this assay, and inadequate number of viral copies (<131 copies/mL). A negative result must be combined with clinical observations, patient history, and epidemiological information. The expected result is Negative.  Fact Sheet for Patients:  https://www.moore.com/  Fact Sheet for Healthcare Providers:  https://www.young.biz/  This test is no t yet approved or cleared by the [place] FDA and  has been authorized for detection and/or diagnosis of SARS-CoV-2 by FDA under an Emergency Use Authorization (EUA). This EUA will remain  in effect (meaning this test can be used) for the duration of the COVID-19 declaration under Section 564(b)(1)  of the Act, 21 U.S.C. section 360bbb-3(b)(1), unless the authorization is terminated or revoked sooner.     Influenza A by PCR NEGATIVE NEGATIVE Final   Influenza B by PCR NEGATIVE NEGATIVE Final    Comment: (NOTE) The Xpert Xpress SARS-CoV-2/FLU/RSV assay is intended as an aid in  the diagnosis of influenza from Nasopharyngeal swab specimens and  should not be used as a sole basis for treatment. Nasal washings and  aspirates are unacceptable for Xpert Xpress SARS-CoV-2/FLU/RSV  testing.  Fact Sheet for Patients: https://www.moore.com/  Fact Sheet for Healthcare Providers: https://www.young.biz/  This test is not yet approved or cleared by the [place] FDA and  has been authorized for detection and/or diagnosis of SARS-CoV-2 by  FDA under an Emergency Use Authorization (EUA). This EUA will remain  in effect (meaning this test can be used) for the  duration of the  Covid-19 declaration under Section 564(b)(1) of the Act, 21  U.S.C. section 360bbb-3(b)(1), unless the authorization is  terminated or revoked. Performed at [facility], [address] [place], [place] [address]          Radiology Studies: [facility] Chest Port 1 View  Result Date: 03/29/2020 CLINICAL DATA:  Hallucinations. EXAM: PORTABLE CHEST 1 VIEW COMPARISON:  Radiographs 12/25/2018 and 12/24/2018.  CT 05/21/2017. FINDINGS: 1400 hours. The heart size and mediastinal contours are normal. The lungs are clear. There is no pleural effusion or pneumothorax. No acute osseous findings are identified. IMPRESSION: No active cardiopulmonary process. Electronically Signed   By: Carey Bullocks M.D.   On: 03/29/2020 14:32        Scheduled Meds: . amLODipine  10 mg Oral Daily  . benztropine  3 mg Oral BID  . desmopressin  0.2 mg Oral QHS  . divalproex  2,000 mg Oral QPM  . divalproex  500 mg Oral q morning - 10a  . enoxaparin (LOVENOX) injection  40 mg Subcutaneous Q24H  . ferrous sulfate  325 mg Oral QODAY  . gabapentin  400 mg Oral TID  . haloperidol  10 mg Oral BID  . insulin aspart  0-15 Units Subcutaneous TID WC  . insulin aspart  0-5 Units Subcutaneous QHS  . insulin glargine  15 Units Subcutaneous QHS  . melatonin  5 mg Oral QHS  . pantoprazole  40 mg Oral Daily   Continuous Infusions:   LOS: 0 days    Time spent: 30 minutes    Dorcas Carrow, MD [facility] Pager [phone]

## 2020-03-30 NOTE — Progress Notes (Signed)
Patient with potassium 5.7.  Medically stable if deemed necessary to transfer to psych . Waiting for psych evaluation

## 2020-03-30 NOTE — Progress Notes (Signed)
Patient was reevaluated.  On further questioning, he denies any suicidal or homicidal ideation.  He tells me that he did mention to me the [facility] and he wish he can kill him but he will not do it .   He clearly tells me that he has no plans to do any harm. He tells me that he was joking with me.  He told psychiatrist that he was just joking with me.   Plan:  reviewed IVC, is incomplete . No need to renew. Today's eval by psych and myself , he is without any harm to himself or others. Continue sitter today His main problem is no place to go after discharge. Social worker will touch base with his [facility] case worker tomorrow and discharge under their care.  Will monitor him overnight to ensure safe disposition with his community social workers team.

## 2020-03-31 DIAGNOSIS — E875 Hyperkalemia: Secondary | ICD-10-CM | POA: Diagnosis not present

## 2020-03-31 LAB — GLUCOSE, CAPILLARY
Glucose-Capillary: 134 mg/dL — ABNORMAL HIGH (ref 70–99)
Glucose-Capillary: 169 mg/dL — ABNORMAL HIGH (ref 70–99)
Glucose-Capillary: 216 mg/dL — ABNORMAL HIGH (ref 70–99)

## 2020-03-31 LAB — BASIC METABOLIC PANEL
Anion gap: 6 (ref 5–15)
BUN: 26 mg/dL — ABNORMAL HIGH (ref 8–23)
CO2: 21 mmol/L — ABNORMAL LOW (ref 22–32)
Calcium: 9 mg/dL (ref 8.9–10.3)
Chloride: 108 mmol/L (ref 98–111)
Creatinine, Ser: 1.35 mg/dL — ABNORMAL HIGH (ref 0.61–1.24)
GFR, Estimated: 59 mL/min — ABNORMAL LOW (ref >60)
Glucose, Bld: 131 mg/dL — ABNORMAL HIGH (ref 70–99)
Potassium: 4.8 mmol/L (ref 3.5–5.1)
Sodium: 135 mmol/L (ref 135–145)

## 2020-03-31 LAB — HIV ANTIBODY (ROUTINE TESTING W REFLEX): HIV Screen 4th Generation wRfx: NONREACTIVE

## 2020-03-31 MED ORDER — HALOPERIDOL 10 MG PO TABS: 10.0000 mg | ORAL_TABLET | Freq: Two times a day (BID) | ORAL | 0 refills | Status: DC

## 2020-03-31 NOTE — Discharge Summary (Signed)
Physician Discharge Summary  Ocean Schildt ZTI:[id number] DOB: 1955-12-09 DOA: 03/29/2020  PCP: Katha Cabal, DO  Admit date: 03/29/2020 Discharge date: 03/31/2020  Admitted From: Home Disposition: Home  Recommendations for Outpatient Follow-up:  1. Follow up with PCP in 1-2 weeks 2. Schedule follow-up with your psychiatrist.  Home Health: Not applicable Equipment/Devices: Not applicable  Discharge Condition: Stable CODE STATUS: Full code Diet recommendation: Low-salt, low-carb diet  Discharge summary:  64 year old gentleman with history of type 2 diabetes on insulin, hypertension hyperlipidemia, recurrent hyperkalemia and schizophrenia with bipolar disorder brought from his community living place with hallucinations and abnormal behavior.  In the emergency room, hemodynamically stable.  Potassium was 6.7 so admitted to medical service.  Hyperkalemia, due to dehydration and being on losartan.  Patient tends to be hyperkalemic.  Treated with IV fluids and potassium normalized.  He is not a good candidate for ACE or ARB.  Discontinued.  Medically remains a stable.  Blood pressures well controlled on amlodipine. Diabetes is well controlled on current regimen of insulin.  Schizophrenia with exacerbation, abnormal behavior: Patient gives different history to provider on admission.   Medication adjustment done by psychiatry.  As per psychiatry recommendations We will continue valproic acid 500 mg in the morning, 2000 mg in the evening. Continue lithium, his lithium was therapeutic. Discontinue Zyprexa, psych recommended to resume Haldol 10 mg twice a day that is prescribed. Apparently, there is plan to put him back on long-acting Haldol and this will be taken care of by his outpatient provider. Initially he was not very forthcoming and stated some homicidal ideation, however he denies this later.  Lethality: Denies any delusions.  Denies any hallucinations.  Denies any suicidal  ideation.  Denies any homicidal ideation.  Today he is very well composed and alert and oriented.  His main concern is about his housing.  Patient is medically stable.  Cleared by psychiatry.  Discharged with medication adjustment.  For safe disposition, patient is going to be discharged with ACT worker.  Discharge Diagnoses:  Principal Problem:   Hyperkalemia, diminished renal excretion Active Problems:   Type 2 diabetes mellitus with diabetic neuropathy (HCC)   Iron deficiency anemia   Chronic kidney disease   HTN (hypertension)   Schizoaffective disorder, bipolar type [facility])    Discharge Instructions  Discharge Instructions    Diet Carb Modified   Complete by: As directed    Increase activity slowly   Complete by: As directed      Allergies as of 03/31/2020      Reactions   Chantix [varenicline Tartrate] Other (See Comments)   Nightmares   Ativan [lorazepam] Other (See Comments)   Agitation (and "5 others like it") Will need to call [facility] [place] (phone) [phone] and [phone] to fax a release-   Shellfish-derived Products Itching   Penicillins Diarrhea      Medication List    STOP taking these medications   Cholecalciferol 50 MCG (2000 UT) Caps   cyclobenzaprine 5 MG tablet Commonly known as: FLEXERIL   Fish Oil Omega-3 1000 MG Caps   Lokelma 10 g Pack packet Generic drug: sodium zirconium cyclosilicate   OLANZapine 10 MG tablet Commonly known as: ZYPREXA     TAKE these medications   albuterol 108 (90 Base) MCG/ACT inhaler Commonly known as: VENTOLIN HFA Inhale 1-2 puffs into the lungs every 6 (six) hours as needed for wheezing or shortness of breath.   amLODipine 10 MG tablet Commonly known as: NORVASC Take 10  mg by mouth daily.   benztropine 1 MG tablet Commonly known as: COGENTIN Take 1 tablet (1 mg total) by mouth daily. What changed:   how much to take  when to take this   desmopressin 0.2 MG tablet Commonly  known as: DDAVP Take 0.2 mg by mouth at bedtime.   divalproex 500 MG DR tablet Commonly known as: DEPAKOTE Take 4 tablets (2,000 mg total) by mouth every evening.   divalproex 500 MG DR tablet Commonly known as: DEPAKOTE Take 1 tablet (500 mg total) by mouth every morning.   ferrous sulfate 325 (65 FE) MG EC tablet Take 1 tablet (325 mg total) by mouth every other day.   gabapentin 400 MG capsule Commonly known as: NEURONTIN Take 400 mg by mouth 3 (three) times daily.   haloperidol 10 MG tablet Commonly known as: HALDOL Take 1 tablet (10 mg total) by mouth 2 (two) times daily.   haloperidol decanoate 100 MG/ML injection Commonly known as: HALDOL DECANOATE Inject 200 mg into the muscle every 21 ( twenty-one) days.   hydrOXYzine 25 MG tablet Commonly known as: ATARAX/VISTARIL Take 1 tablet (25 mg total) by mouth 2 (two) times daily as needed for anxiety.   insulin lispro 100 UNIT/ML injection Commonly known as: HUMALOG Inject 0-10 Units into the skin in the morning and at bedtime. Glucose 70-199, 0 units 200-249, 2 units 250-299, 4 units 300-349, 6 units 350-399, 8 units 400-449, 10 units > 450 Call MD   Lantus SoloStar 100 UNIT/ML Solostar Pen Generic drug: insulin glargine Inject 35 Units into the skin daily. What changed:   how much to take  when to take this   LITHIUM CARBONATE ER PO Take 450 mg by mouth at bedtime.   melatonin 5 MG Tabs Take 5 mg by mouth at bedtime.   omeprazole 20 MG capsule Commonly known as: PRILOSEC Take 20 mg by mouth 2 (two) times daily before a meal.   polyethylene glycol 17 g packet Commonly known as: MIRALAX / GLYCOLAX Take 17 g by mouth daily as needed.       Follow-up Information    Katha Cabal, DO Follow up in 2 week(s).   Specialty: Family Medicine Contact information: [address] [place] [address] [phone]              Allergies  Allergen Reactions  . Chantix [Varenicline Tartrate]  Other (See Comments)    Nightmares  . Ativan [Lorazepam] Other (See Comments)    Agitation (and "5 others like it") Will need to call [facility] [place] (phone) [phone] and [phone] to fax a release-   . Shellfish-Derived Products Itching  . Penicillins Diarrhea    Consultations:  Psychiatry   Procedures/Studies: DG Chest Port 1 View  Result Date: 03/29/2020 CLINICAL DATA:  Hallucinations. EXAM: PORTABLE CHEST 1 VIEW COMPARISON:  Radiographs 12/25/2018 and 12/24/2018.  CT 05/21/2017. FINDINGS: 1400 hours. The heart size and mediastinal contours are normal. The lungs are clear. There is no pleural effusion or pneumothorax. No acute osseous findings are identified. IMPRESSION: No active cardiopulmonary process. Electronically Signed   By: Carey Bullocks M.D.   On: 03/29/2020 14:32   (Echo, Carotid, EGD, Colonoscopy, ERCP)    Subjective: Patient seen and examined.  No overnight events.  He thinks he might have some constipation, offered laxative but he said he will walk around and go on his own. Patient feels well.  He is walking around the hallway.  Denies any problems.  He is looking forward to go to his own room. Patient can use his insulin himself and is very well educated about the doses and administration.   Discharge Exam: Vitals:   03/30/20 2011 03/31/20 0335  BP: (!) 133/92 122/85  Pulse: 72 73  Resp: 15 15  Temp: 97.7 F (36.5 C) 98.3 F (36.8 C)  SpO2: 100% 100%   Vitals:   03/30/20 0628 03/30/20 2011 03/31/20 0335 03/31/20 0500  BP: 104/64 (!) 133/92 122/85   Pulse: 61 72 73   Resp: 19 15 15    Temp: (!) 97.5 F (36.4 C) 97.7 F (36.5 C) 98.3 F (36.8 C)   TempSrc: Oral Oral Oral   SpO2: 98% 100% 100%   Weight:    100.8 kg    General: Pt is alert, awake, not in acute distress Cardiovascular: RRR, S1/S2 +, no rubs, no gallops Respiratory: CTA bilaterally, no wheezing, no rhonchi Abdominal: Soft, NT, ND, bowel sounds  + Extremities: no edema, no cyanosis    The results of significant diagnostics from this hospitalization (including imaging, microbiology, ancillary and laboratory) are listed below for reference.     Microbiology: Recent Results (from the past 240 hour(s))  Respiratory Panel by RT PCR (Flu A&B, Covid) - Urine, Clean Catch     Status: None   Collection Time: 03/29/20  2:00 PM   Specimen: Urine, Clean Catch; Nasopharyngeal  Result Value Ref Range Status   SARS Coronavirus 2 by RT PCR NEGATIVE NEGATIVE Final    Comment: (NOTE) SARS-CoV-2 target nucleic acids are NOT DETECTED.  The SARS-CoV-2 RNA is generally detectable in upper respiratoy specimens during the acute phase of infection. The lowest concentration of SARS-CoV-2 viral copies this assay can detect is 131 copies/mL. A negative result does not preclude SARS-Cov-2 infection and should not be used as the sole basis for treatment or other patient management decisions. A negative result may occur with  improper specimen collection/handling, submission of specimen other than nasopharyngeal swab, presence of viral mutation(s) within the areas targeted by this assay, and inadequate number of viral copies (<131 copies/mL). A negative result must be combined with clinical observations, patient history, and epidemiological information. The expected result is Negative.  Fact Sheet for Patients:  03/31/20  Fact Sheet for Healthcare Providers:  https://www.moore.com/  This test is no t yet approved or cleared by the https://www.young.biz/ FDA and  has been authorized for detection and/or diagnosis of SARS-CoV-2 by FDA under an Emergency Use Authorization (EUA). This EUA will remain  in effect (meaning this test can be used) for the duration of the COVID-19 declaration under Section 564(b)(1) of the Act, 21 U.S.C. section 360bbb-3(b)(1), unless the authorization is terminated or revoked  sooner.     Influenza A by PCR NEGATIVE NEGATIVE Final   Influenza B by PCR NEGATIVE NEGATIVE Final    Comment: (NOTE) The Xpert Xpress SARS-CoV-2/FLU/RSV assay is intended as an aid in  the diagnosis of influenza from Nasopharyngeal swab specimens and  should not be used as a sole basis for treatment. Nasal washings and  aspirates are unacceptable for Xpert Xpress SARS-CoV-2/FLU/RSV  testing.  Fact Sheet for Patients: [place]  Fact Sheet for Healthcare Providers: https://www.moore.com/  This test is not yet approved or cleared by the https://www.young.biz/ FDA and  has been authorized for detection and/or diagnosis of SARS-CoV-2 by  FDA under an Emergency Use Authorization (EUA). This EUA will remain  in effect (meaning this test can be used) for the duration of  the  Covid-19 declaration under Section 564(b)(1) of the Act, 21  U.S.C. section 360bbb-3(b)(1), unless the authorization is  terminated or revoked. Performed at [facility], [address] [place], [place] [id number]      Labs: BNP (last 3 results) No results for input(s): BNP in the last 8760 hours. Basic Metabolic Panel: Recent Labs  Lab 03/29/20 1345 03/29/20 1600 03/30/20 0350 03/31/20 0426  NA 138  --  138 135  K 6.7* 6.2* 5.7* 4.8  CL 110  --  110 108  CO2 22  --  22 21*  GLUCOSE 139*  --  154* 131*  BUN 27*  --  27* 26*  CREATININE 1.51*  --  1.46* 1.35*  CALCIUM 9.3  --  9.0 9.0   Liver Function Tests: Recent Labs  Lab 03/29/20 1345  AST 12*  ALT 14  ALKPHOS 53  BILITOT 0.5  PROT 7.4  ALBUMIN 3.7   No results for input(s): LIPASE, AMYLASE in the last 168 hours. No results for input(s): AMMONIA in the last 168 hours. CBC: Recent Labs  Lab 03/29/20 1345  WBC 6.4  NEUTROABS 3.5  HGB 11.3*  HCT 36.7*  MCV 92.2  PLT 251   Cardiac Enzymes: No results for input(s): CKTOTAL, CKMB, CKMBINDEX, TROPONINI in the last 168  hours. BNP: Invalid input(s): POCBNP CBG: Recent Labs  Lab 03/30/20 1703 03/30/20 2058 03/31/20 0255 03/31/20 0713 03/31/20 1156  GLUCAP 142* 235* 134* 169* 216*   D-Dimer No results for input(s): DDIMER in the last 72 hours. Hgb A1c No results for input(s): HGBA1C in the last 72 hours. Lipid Profile No results for input(s): CHOL, HDL, LDLCALC, TRIG, CHOLHDL, LDLDIRECT in the last 72 hours. Thyroid function studies No results for input(s): TSH, T4TOTAL, T3FREE, THYROIDAB in the last 72 hours.  Invalid input(s): FREET3 Anemia work up No results for input(s): VITAMINB12, FOLATE, FERRITIN, TIBC, IRON, RETICCTPCT in the last 72 hours. Urinalysis    Component Value Date/Time   COLORURINE STRAW (A) 02/20/2019 1622   APPEARANCEUR CLEAR 02/20/2019 1622   LABSPEC 1.004 (L) 02/20/2019 1622   PHURINE 7.0 02/20/2019 1622   GLUCOSEU NEGATIVE 02/20/2019 1622   HGBUR NEGATIVE 02/20/2019 1622   BILIRUBINUR NEGATIVE 02/20/2019 1622   BILIRUBINUR negative 01/03/2018 1445   KETONESUR NEGATIVE 02/20/2019 1622   PROTEINUR NEGATIVE 02/20/2019 1622   UROBILINOGEN 0.2 01/03/2018 1445   NITRITE NEGATIVE 02/20/2019 1622   LEUKOCYTESUR NEGATIVE 02/20/2019 1622   Sepsis Labs Invalid input(s): PROCALCITONIN,  WBC,  LACTICIDVEN Microbiology Recent Results (from the past 240 hour(s))  Respiratory Panel by RT PCR (Flu A&B, Covid) - Urine, Clean Catch     Status: None   Collection Time: 03/29/20  2:00 PM   Specimen: Urine, Clean Catch; Nasopharyngeal  Result Value Ref Range Status   SARS Coronavirus 2 by RT PCR NEGATIVE NEGATIVE Final    Comment: (NOTE) SARS-CoV-2 target nucleic acids are NOT DETECTED.  The SARS-CoV-2 RNA is generally detectable in upper respiratoy specimens during the acute phase of infection. The lowest concentration of SARS-CoV-2 viral copies this assay can detect is 131 copies/mL. A negative result does not preclude SARS-Cov-2 infection and should not be used as the sole  basis for treatment or other patient management decisions. A negative result may occur with  improper specimen collection/handling, submission of specimen other than nasopharyngeal swab, presence of viral mutation(s) within the areas targeted by this assay, and inadequate number of viral copies (<131 copies/mL). A negative result must be  combined with clinical observations, patient history, and epidemiological information. The expected result is Negative.  Fact Sheet for Patients:  https://www.moore.com/  Fact Sheet for Healthcare Providers:  https://www.young.biz/  This test is no t yet approved or cleared by the [place] FDA and  has been authorized for detection and/or diagnosis of SARS-CoV-2 by FDA under an Emergency Use Authorization (EUA). This EUA will remain  in effect (meaning this test can be used) for the duration of the COVID-19 declaration under Section 564(b)(1) of the Act, 21 U.S.C. section 360bbb-3(b)(1), unless the authorization is terminated or revoked sooner.     Influenza A by PCR NEGATIVE NEGATIVE Final   Influenza B by PCR NEGATIVE NEGATIVE Final    Comment: (NOTE) The Xpert Xpress SARS-CoV-2/FLU/RSV assay is intended as an aid in  the diagnosis of influenza from Nasopharyngeal swab specimens and  should not be used as a sole basis for treatment. Nasal washings and  aspirates are unacceptable for Xpert Xpress SARS-CoV-2/FLU/RSV  testing.  Fact Sheet for Patients: https://www.moore.com/  Fact Sheet for Healthcare Providers: https://www.young.biz/  This test is not yet approved or cleared by the [place] FDA and  has been authorized for detection and/or diagnosis of SARS-CoV-2 by  FDA under an Emergency Use Authorization (EUA). This EUA will remain  in effect (meaning this test can be used) for the duration of the  Covid-19 declaration under Section 564(b)(1) of the  Act, 21  U.S.C. section 360bbb-3(b)(1), unless the authorization is  terminated or revoked. Performed at [facility], [address] [place], [place] [address]      Time coordinating discharge:  35 minutes  SIGNED:   Dorcas Carrow, MD  [facility] 03/31/2020, 12:08 PM

## 2020-03-31 NOTE — TOC Transition Note (Signed)
Transition of Care [facility]) - CM/SW Discharge Note   Patient Details  Name: Dylan Jordan MRN: [id number] Date of Birth: [birth year]  Transition of Care [facility]) CM/SW Contact:  Ida Rogue, LCSW Phone Number: 03/31/2020, 10:31 AM   Clinical Narrative:   Sherron Monday with Judeth Cornfield this AM, team lead for [facility] ACT team.  She stated that patient's housing had been paid for by [facility], a program set up to keep those with mental health diagnoses in the community and out of institutions. Unfortunately, this latest incident has rendered him ineligible for that program, meaning the ACT team is scrambling to figure out where he will land at d/c. She asked for more time.  I let her know she can take all the time she needs today, but he will be d/cing today per MD.  She will plan on picking him up mid-afternoon.  TOC will continue to follow during the course of hospitalization.     Final next level of care: Home/Self Care Barriers to Discharge: No Barriers Identified   Patient Goals and CMS Choice        Discharge Placement                       Discharge Plan and Services                                     Social Determinants of Health (SDOH) Interventions     Readmission Risk Interventions No flowsheet data found.

## 2020-04-20 DIAGNOSIS — Z9119 Patient's noncompliance with other medical treatment and regimen: Secondary | ICD-10-CM | POA: Insufficient documentation

## 2020-04-20 DIAGNOSIS — E1165 Type 2 diabetes mellitus with hyperglycemia: Secondary | ICD-10-CM | POA: Diagnosis present

## 2020-04-20 DIAGNOSIS — Z91199 Patient's noncompliance with other medical treatment and regimen due to unspecified reason: Secondary | ICD-10-CM | POA: Insufficient documentation

## 2020-04-27 DIAGNOSIS — K5901 Slow transit constipation: Secondary | ICD-10-CM | POA: Diagnosis present

## 2020-04-27 DIAGNOSIS — N1831 Chronic kidney disease, stage 3a: Secondary | ICD-10-CM | POA: Insufficient documentation

## 2020-09-05 DIAGNOSIS — Z5900 Homelessness unspecified: Secondary | ICD-10-CM | POA: Insufficient documentation

## 2020-09-22 ENCOUNTER — Encounter: Payer: Medicaid Other | Admitting: Family Medicine

## 2020-09-23 ENCOUNTER — Other Ambulatory Visit: Payer: Self-pay

## 2020-09-23 ENCOUNTER — Ambulatory Visit (HOSPITAL_COMMUNITY)
Admission: EM | Admit: 2020-09-23 | Discharge: 2020-09-23 | Disposition: A | Discharge status: Home / Self Care | Payer: Medicaid Other

## 2020-09-23 DIAGNOSIS — Z5900 Homelessness unspecified: Secondary | ICD-10-CM

## 2020-09-23 NOTE — Discharge Instructions (Addendum)
Follow up with ACT Team

## 2020-09-23 NOTE — ED Provider Notes (Signed)
Behavioral Health Urgent Care Medical Screening Exam  Patient Name: Dylan Jordan MRN: [id number] Date of Evaluation: 09/23/20 Chief Complaint:   Diagnosis:  Final diagnoses:  Homelessness    History of Present illness: Dylan Jordan is a 65 y.o. male with a history of schizophrenia. Patient refuses to participate in the assessment with this provider and TTS counselor. Patient also refuses to speak with other staff members. See note from [facility], Marzetta Board.   Patient was evaluated in the ED at [facility] today and discharged this evening. Per their notes, the patient denied SI and HI. It appears that SW provided patient with a taxi voucher to a family members house in [place] on discharge.   Respirations are Dylan and unlabored. VSS. No obvious signs of injury noted. Does not appear to be experiencing a medical emergency.    Psychiatric Specialty Exam  Presentation  General Appearance:No data recorded Eye Contact:No data recorded Speech:No data recorded Speech Volume:No data recorded Handedness:No data recorded  Mood and Affect  Mood:No data recorded Affect:No data recorded  Thought Process  Thought Processes:No data recorded Descriptions of Associations:No data recorded Orientation:No data recorded Thought Content:No data recorded   Hallucinations:No data recorded Ideas of Reference:No data recorded Suicidal Thoughts:No data recorded Homicidal Thoughts:No data recorded  Sensorium  Memory:No data recorded Judgment:No data recorded Insight:No data recorded  Executive Functions  Concentration:No data recorded Attention Span:No data recorded Recall:No data recorded Fund of Knowledge:No data recorded Language:No data recorded  Psychomotor Activity  Psychomotor Activity:No data recorded  Assets  Assets:No data recorded  Sleep  Sleep:No data recorded Number of hours: No data recorded  No data recorded  Physical Exam: Physical Exam Vitals  and nursing note reviewed.  Constitutional:      General: He is not in acute distress.    Appearance: He is not ill-appearing or toxic-appearing.  Neurological:     Mental Status: He is alert.    Review of Systems  Unable to perform ROS: Other   Blood pressure (!) 143/87, pulse 93, temperature (!) 97.3 F (36.3 C), temperature source Temporal, resp. rate 18, SpO2 96 %. There is no height or weight on file to calculate BMI.   [facility] MSE Discharge Disposition for Follow up and Recommendations: Based on my evaluation the patient does not appear to have an emergency medical condition and can be discharged with resources and follow up care in outpatient services for follow up with ACT Team   Jackelyn Poling, NP 09/23/2020, 11:02 PM

## 2020-09-23 NOTE — ED Notes (Addendum)
Pt discharged in no acute distress. A&O x4, ambulatory. Marie, RN/[facility] provided AVS instructions to pt. Belongings returned to pt intact. Sheriff's office contacted to transport pt home. Pt in [facility] awaiting sheriff's office. Safety maintained.

## 2020-09-23 NOTE — BH Assessment (Signed)
Dylan Jordan is a 65 year old male presenting voluntarily to [facility]. Per chart patient has a history of schizophrenia. Per chart, patient was evaluated at [facility] in the ED, per notes patient denied SI and HI. TTS clinician made multiple attempts in asking  assessment questions, however patient refused to participate in the assessment. Delena Bali, NP and Marzetta Board, [facility], notified of patients non-compliance and agitation during attempted TTS assessment. Patient also refused to cooperate with Nira Conn, NP and Marzetta Board, [facility]. Patient refused, unable to assess.   See Nira Conn, NP, note See Marzetta Board, [facility], note

## 2020-09-23 NOTE — Progress Notes (Signed)
Multiple attempts made by this writer to get patient to participate in assessment process.  He became demanding - stating, "You are going to go and get me some water and something to eat and you are going to do it now.  I don't have to answer any questions until I get everything I want from you."  He then began yelling, "YOU SIT DOWN NOW!!  YOU WILL DO WHAT I SAY WHEN I SAY IT!"  Pt was offered ice water and tissues per his request in order to provide encouragement for him to participate in assessment.  He refused to take the items and said, "There are more things I want" and then yelled, "YOU WILL DO WHAT I WANT!"  Pt refused to answer any assessment questions, refused to let go of his cane and began leaning forward with cane in hand in intimidating manner.    This writer left the room and advised patient that we could not assist him if he refused to answer assessment questions.  I asked if he was wanting to hurt himself and he yelled, "WHAT IS WRONG WITH YOU? YOU NEED TO MAKE ME UP A BED AND THEN DO ANYTHING ELSE I ASK!"

## 2020-09-24 ENCOUNTER — Other Ambulatory Visit: Payer: Self-pay

## 2020-09-24 ENCOUNTER — Emergency Department (HOSPITAL_COMMUNITY): Payer: Medicaid Other

## 2020-09-24 ENCOUNTER — Emergency Department (HOSPITAL_COMMUNITY)
Admission: EM | Admit: 2020-09-24 | Discharge: 2020-09-24 | Disposition: A | Discharge status: Home / Self Care | Payer: Medicaid Other | Attending: Emergency Medicine | Admitting: Emergency Medicine

## 2020-09-24 ENCOUNTER — Encounter (HOSPITAL_COMMUNITY): Payer: Self-pay

## 2020-09-24 DIAGNOSIS — I129 Hypertensive chronic kidney disease with stage 1 through stage 4 chronic kidney disease, or unspecified chronic kidney disease: Secondary | ICD-10-CM | POA: Diagnosis not present

## 2020-09-24 DIAGNOSIS — M25561 Pain in right knee: Secondary | ICD-10-CM | POA: Diagnosis not present

## 2020-09-24 DIAGNOSIS — Z794 Long term (current) use of insulin: Secondary | ICD-10-CM | POA: Diagnosis not present

## 2020-09-24 DIAGNOSIS — Z20822 Contact with and (suspected) exposure to covid-19: Secondary | ICD-10-CM | POA: Diagnosis not present

## 2020-09-24 DIAGNOSIS — F250 Schizoaffective disorder, bipolar type: Secondary | ICD-10-CM | POA: Diagnosis not present

## 2020-09-24 DIAGNOSIS — F209 Schizophrenia, unspecified: Secondary | ICD-10-CM

## 2020-09-24 DIAGNOSIS — E1122 Type 2 diabetes mellitus with diabetic chronic kidney disease: Secondary | ICD-10-CM | POA: Diagnosis not present

## 2020-09-24 DIAGNOSIS — N189 Chronic kidney disease, unspecified: Secondary | ICD-10-CM | POA: Diagnosis not present

## 2020-09-24 DIAGNOSIS — Z96652 Presence of left artificial knee joint: Secondary | ICD-10-CM | POA: Diagnosis not present

## 2020-09-24 DIAGNOSIS — F17210 Nicotine dependence, cigarettes, uncomplicated: Secondary | ICD-10-CM | POA: Diagnosis not present

## 2020-09-24 DIAGNOSIS — G8929 Other chronic pain: Secondary | ICD-10-CM | POA: Insufficient documentation

## 2020-09-24 DIAGNOSIS — J4530 Mild persistent asthma, uncomplicated: Secondary | ICD-10-CM | POA: Diagnosis not present

## 2020-09-24 DIAGNOSIS — Z79899 Other long term (current) drug therapy: Secondary | ICD-10-CM | POA: Insufficient documentation

## 2020-09-24 DIAGNOSIS — E1140 Type 2 diabetes mellitus with diabetic neuropathy, unspecified: Secondary | ICD-10-CM | POA: Diagnosis not present

## 2020-09-24 DIAGNOSIS — M25461 Effusion, right knee: Secondary | ICD-10-CM | POA: Insufficient documentation

## 2020-09-24 DIAGNOSIS — D631 Anemia in chronic kidney disease: Secondary | ICD-10-CM | POA: Diagnosis not present

## 2020-09-24 LAB — URINALYSIS, COMPLETE (UACMP) WITH MICROSCOPIC
Bacteria, UA: NONE SEEN
Bilirubin Urine: NEGATIVE
Glucose, UA: >=500 mg/dL — AB
Hgb urine dipstick: NEGATIVE
Ketones, ur: NEGATIVE mg/dL
Leukocytes,Ua: NEGATIVE
Nitrite: NEGATIVE
Protein, ur: 30 mg/dL — AB
Specific Gravity, Urine: 1.012 (ref 1.005–1.030)
pH: 5 (ref 5.0–8.0)

## 2020-09-24 LAB — COMPREHENSIVE METABOLIC PANEL
ALT: 19 U/L (ref 0–44)
AST: 12 U/L — ABNORMAL LOW (ref 15–41)
Albumin: 3.6 g/dL (ref 3.5–5.0)
Alkaline Phosphatase: 78 U/L (ref 38–126)
Anion gap: 8 (ref 5–15)
BUN: 28 mg/dL — ABNORMAL HIGH (ref 8–23)
CO2: 24 mmol/L (ref 22–32)
Calcium: 9.5 mg/dL (ref 8.9–10.3)
Chloride: 105 mmol/L (ref 98–111)
Creatinine, Ser: 1.39 mg/dL — ABNORMAL HIGH (ref 0.61–1.24)
GFR, Estimated: 57 mL/min — ABNORMAL LOW (ref >60)
Glucose, Bld: 340 mg/dL — ABNORMAL HIGH (ref 70–99)
Potassium: 4.7 mmol/L (ref 3.5–5.1)
Sodium: 137 mmol/L (ref 135–145)
Total Bilirubin: 0.5 mg/dL (ref 0.3–1.2)
Total Protein: 7.3 g/dL (ref 6.5–8.1)

## 2020-09-24 LAB — CBC WITH DIFFERENTIAL/PLATELET
Abs Immature Granulocytes: 0.02 10*3/uL (ref 0.00–0.07)
Basophils Absolute: 0 10*3/uL (ref 0.0–0.1)
Basophils Relative: 1 %
Eosinophils Absolute: 0.2 10*3/uL (ref 0.0–0.5)
Eosinophils Relative: 2 %
HCT: 38.4 % — ABNORMAL LOW (ref 39.0–52.0)
Hemoglobin: 12.3 g/dL — ABNORMAL LOW (ref 13.0–17.0)
Immature Granulocytes: 0 %
Lymphocytes Relative: 34 %
Lymphs Abs: 2.2 10*3/uL (ref 0.7–4.0)
MCH: 26.9 pg (ref 26.0–34.0)
MCHC: 32 g/dL (ref 30.0–36.0)
MCV: 84 fL (ref 80.0–100.0)
Monocytes Absolute: 0.7 10*3/uL (ref 0.1–1.0)
Monocytes Relative: 11 %
Neutro Abs: 3.4 10*3/uL (ref 1.7–7.7)
Neutrophils Relative %: 52 %
Platelets: 191 10*3/uL (ref 150–400)
RBC: 4.57 MIL/uL (ref 4.22–5.81)
RDW: 15.8 % — ABNORMAL HIGH (ref 11.5–15.5)
WBC: 6.4 10*3/uL (ref 4.0–10.5)
nRBC: 0 % (ref 0.0–0.2)

## 2020-09-24 LAB — RESP PANEL BY RT-PCR (FLU A&B, COVID) ARPGX2
Influenza A by PCR: NEGATIVE
Influenza B by PCR: NEGATIVE
SARS Coronavirus 2 by RT PCR: NEGATIVE

## 2020-09-24 LAB — ETHANOL: Alcohol, Ethyl (B): <10 mg/dL (ref <10)

## 2020-09-24 LAB — CBG MONITORING, ED: Glucose-Capillary: 331 mg/dL — ABNORMAL HIGH (ref 70–99)

## 2020-09-24 LAB — RAPID URINE DRUG SCREEN, HOSP PERFORMED
Amphetamines: NOT DETECTED
Barbiturates: NOT DETECTED
Benzodiazepines: NOT DETECTED
Cocaine: NOT DETECTED
Opiates: NOT DETECTED
Tetrahydrocannabinol: NOT DETECTED

## 2020-09-24 LAB — ACETAMINOPHEN LEVEL: Acetaminophen (Tylenol), Serum: <10 ug/mL — ABNORMAL LOW (ref 10–30)

## 2020-09-24 LAB — SALICYLATE LEVEL: Salicylate Lvl: <7 mg/dL — ABNORMAL LOW (ref 7.0–30.0)

## 2020-09-24 MED ORDER — LORAZEPAM 1 MG PO TABS
0.0000 mg | ORAL_TABLET | Freq: Two times a day (BID) | ORAL | Status: DC
Start: 2020-09-26 — End: 2020-09-24

## 2020-09-24 MED ORDER — LORAZEPAM 2 MG/ML IJ SOLN: 0.0000 mg | Freq: Four times a day (QID) | INTRAMUSCULAR | Status: DC

## 2020-09-24 MED ORDER — LORAZEPAM 1 MG PO TABS: 0.0000 mg | ORAL_TABLET | Freq: Four times a day (QID) | ORAL | Status: DC

## 2020-09-24 MED ORDER — THIAMINE HCL 100 MG PO TABS: 100.0000 mg | ORAL_TABLET | Freq: Every day | ORAL | Status: DC

## 2020-09-24 MED ORDER — LORAZEPAM 2 MG/ML IJ SOLN: 0.0000 mg | Freq: Two times a day (BID) | INTRAMUSCULAR | Status: DC

## 2020-09-24 MED ORDER — THIAMINE HCL 100 MG/ML IJ SOLN: 100.0000 mg | Freq: Every day | INTRAMUSCULAR | Status: DC

## 2020-09-24 NOTE — ED Notes (Addendum)
Pt refusing to leave and yelling at staff. Security and [employer] called to bedside. Security and [employer] escorted pt from the building.

## 2020-09-24 NOTE — BH Assessment (Addendum)
[facility] Assessment Progress Note  Per [name], NP, this voluntary pt does not require psychiatric hospitalization at this time.  He will be safe for discharge if arrangements can be made to turn him over to the care of the [facility] ACT Team, his outpatient provider.  At 11:28 I called them and was told that Judeth Cornfield will call me back.  This call is pending as of this writing.  Doylene Canning, MA Triage Specialist [phone]   Addendum:  At 11:43 Judeth Cornfield returned my call.  She reports that pt's behavior has resulted in pt being excluded from area homeless shelters. They will not come to [facility] to pick pt up without having a place to take him.  They will look for a shelter for him and will contact him in the community if one has a bed for him.  This has been staffed with [name], NP, who has psychiatrically cleared pt.  Discharge instructions advise pt to continue treatment with the ACT Team.  EDP Kristine Royal, MD and pt's nurse, Karma Ganja, have been notified.  Doylene Canning, [place] Behavioral Health Coordinator [phone]

## 2020-09-24 NOTE — ED Notes (Signed)
Asked pt if he would change into scrubs, pt states "I don't need to change into scrubs, I'm here for a knee surgery and knee replacement". Attempted to explain to pt that they did an xray of his knee and surgery isn't necessary at the moment, but pt is talking to himself and refusing to change into scrubs. Will try again later.

## 2020-09-24 NOTE — ED Notes (Signed)
Pt given meal tray.

## 2020-09-24 NOTE — ED Provider Notes (Signed)
[facility]-EMERGENCY DEPT Provider Note   CSN: [id number] Arrival date & time: 09/24/20  0017     History Chief Complaint  Patient presents with  . Mental Health Problem  . Knee Pain    Dylan Jordan is a 65 y.o. male presents to the Emergency Department complaining of gradual, persistent, progressively worsening right knee pain.  Patient reports history of torn meniscus in the left knee.  Reports pain in the right knee is similar.  Denies trauma or known injury.  Reports it has been gradually worsening.  States he has been taking Tylenol and ibuprofen without significant relief.  Walking makes symptoms worse, nothing makes them better.  Patient endorses mild swelling.  Patient has a history of schizophrenia.  Tonight he also endorses "mental hygiene problems."  Patient reports he is not suicidal, homicidal but reports that he needs help with his mental hygiene.  Does endorse hallucinations, but is unable to provide specifics.  Patient with rapid and pressured speech.  Rambling and run-on sentences.  Reports he was recently kicked out of a shelter and per the records has been seen at 2 other facilities in the last 24 hours.  Records reviewed.  Patient was recently discharged from the hospital for hyperglycemia.  Unable to answer whether or not he is currently taking his medications or what his blood sugar has been running.  Level 5 caveat for psychiatric condition.  The history is provided by the patient and medical records. No language interpreter was used.       Past Medical History:  Diagnosis Date  . Anemia   . Asthma   . Diabetes mellitus without complication (HCC)   . Gout   . Hyperlipidemia   . Hyperosmolar hyperglycemic coma due to diabetes mellitus without ketoacidosis (HCC)   . Hypertension   . Schizophrenia (HCC) 07/06/2018    Patient Active Problem List   Diagnosis Date Noted  . Hyperkalemia, diminished renal excretion 03/29/2020  .  Hyperkalemia   . Schizoaffective disorder, bipolar type (HCC) 02/21/2019  . Hallucinations   . Anxiety 09/18/2018  . Swelling of both lower extremities 09/13/2018  . Type 2 diabetes mellitus without complication, with long-term current use of insulin (HCC)   . Anemia   . Hyperlipidemia 12/18/2017  . 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 Intermittent chest pain 11/25/2017  . Allergic rhinitis with a nonallergic component 07/30/2017  . Food allergy 07/30/2017  . Chronic gout involving toe of left foot without tophus 05/18/2017  . Hemorrhoids 05/04/2017  . Haloperidol adverse reaction 04/16/2017  . Chronic low back pain 01/03/2017  . Chronic cough 06/30/2016  . Drug-induced mood disorder (HCC) 06/14/2016  . Type 2 diabetes mellitus with diabetic neuropathy (HCC) 05/26/2016  . Iron deficiency anemia 05/26/2016  . Chronic kidney disease 05/26/2016  . HTN (hypertension) 05/26/2016  . Tobacco abuse 05/26/2016  . History of substance abuse (HCC) 05/26/2016  . History of alcohol abuse 05/26/2016  . Mild persistent asthma 05/26/2016    Past Surgical History:  Procedure Laterality Date  . REPLACEMENT TOTAL KNEE Left   . TOE SURGERY         Family History  Adopted: Yes  Problem Relation Age of Onset  . Allergic rhinitis Neg Hx   . Angioedema Neg Hx   . Asthma Neg Hx   . Atopy Neg Hx   . Eczema Neg Hx   . Immunodeficiency Neg Hx   . Urticaria Neg Hx     Social  History   Tobacco Use  . Smoking status: Current Some Day Smoker    Packs/day: 0.30    Years: 45.00    Pack years: 13.50    Types: Cigarettes    Start date: 05/16/1971  . Smokeless tobacco: Never Used  . Tobacco comment: Decreased Intake to 10 cigarettes per week or less.  Vaping Use  . Vaping Use: Never used  Substance Use Topics  . Alcohol use: No  . Drug use: No    Home Medications Prior to Admission medications   Medication Sig Start Date End Date Taking? Authorizing Provider   albuterol (PROVENTIL HFA;VENTOLIN HFA) 108 (90 Base) MCG/ACT inhaler Inhale 1-2 puffs into the lungs every 6 (six) hours as needed for wheezing or shortness of breath. 03/25/18   Bobbitt, Heywood Ilesalph Carter, MD  amLODipine (NORVASC) 10 MG tablet Take 10 mg by mouth daily.    [provider]  benztropine (COGENTIN) 1 MG tablet Take 1 tablet (1 mg total) by mouth daily. Patient taking differently: Take 2 mg by mouth 2 (two) times daily. 03/28/19   Dollene ClevelandAnderson, Tineka Uriegas C, DO  desmopressin (DDAVP) 0.2 MG tablet Take 0.2 mg by mouth at bedtime.    [provider]  divalproex (DEPAKOTE) 500 MG DR tablet Take 1 tablet (500 mg total) by mouth every morning. 03/29/19   Peggyann ShoalsAnderson, Rick Carruthers C, DO  divalproex (DEPAKOTE) 500 MG DR tablet Take 4 tablets (2,000 mg total) by mouth every evening. 03/28/19   Peggyann ShoalsAnderson, Jiro Kiester C, DO  ferrous sulfate 325 (65 FE) MG EC tablet Take 1 tablet (325 mg total) by mouth every other day. 07/14/19   Shirley, [place], DO  gabapentin (NEURONTIN) 400 MG capsule Take 400 mg by mouth 3 (three) times daily.    [provider]  haloperidol (HALDOL) 10 MG tablet Take 1 tablet (10 mg total) by mouth 2 (two) times daily. 03/31/20 04/30/20  Dorcas CarrowGhimire, Kuber, MD  haloperidol decanoate (HALDOL DECANOATE) 100 MG/ML injection Inject 200 mg into the muscle every 21 ( twenty-one) days.    [provider]  hydrOXYzine (ATARAX/VISTARIL) 25 MG tablet Take 1 tablet (25 mg total) by mouth 2 (two) times daily as needed for anxiety. 03/28/19   Dollene ClevelandAnderson, Morelia Cassells C, DO  Insulin Glargine (LANTUS SOLOSTAR) 100 UNIT/ML Solostar Pen Inject 35 Units into the skin daily. Patient taking differently: Inject 15 Units into the skin at bedtime.  03/28/19   Dollene ClevelandAnderson, Aquil Duhe C, DO  insulin lispro (HUMALOG) 100 UNIT/ML injection Inject 0-10 Units into the skin in the morning and at bedtime. Glucose 70-199, 0 units 200-249, 2 units 250-299, 4 units 300-349, 6 units 350-399, 8 units 400-449, 10  units > 450 Call MD 02/18/19   [provider]  LITHIUM CARBONATE ER PO Take 450 mg by mouth at bedtime.    [provider]  melatonin 5 MG TABS Take 5 mg by mouth at bedtime.    [provider]  omeprazole (PRILOSEC) 20 MG capsule Take 20 mg by mouth 2 (two) times daily before a meal.    [provider]  polyethylene glycol (MIRALAX / GLYCOLAX) 17 g packet Take 17 g by mouth daily as needed. Patient not taking: Reported on 03/31/2020 03/28/19   Dollene ClevelandAnderson, Ezreal Turay C, DO    Allergies    Chantix [varenicline tartrate], Ativan [lorazepam], Shellfish-derived products, and Penicillins  Review of Systems   Review of Systems  Unable to perform ROS: Psychiatric disorder    Physical Exam Updated Vital Signs BP (!) 152/81 (BP  Location: Right Arm)   Pulse 86   Temp 98.8 F (37.1 C) (Oral)   Resp 18   Ht 6\' 1"  (1.854 m)   Wt 111 kg   SpO2 100%   BMI 32.29 kg/m   Physical Exam Vitals and nursing note reviewed.  Constitutional:      General: He is not in acute distress.    Appearance: He is not diaphoretic.  HENT:     Head: Normocephalic.  Eyes:     General: No scleral icterus.    Conjunctiva/sclera: Conjunctivae normal.  Cardiovascular:     Rate and Rhythm: Normal rate and regular rhythm.     Pulses: Normal pulses.          Radial pulses are 2+ on the right side and 2+ on the left side.  Pulmonary:     Effort: No tachypnea, accessory muscle usage, prolonged expiration, respiratory distress or retractions.     Breath sounds: No stridor.     Comments: Equal chest rise. No increased work of breathing. Abdominal:     General: There is no distension.     Palpations: Abdomen is soft.     Tenderness: There is no abdominal tenderness. There is no guarding or rebound.  Musculoskeletal:     Cervical back: Normal range of motion.     Right knee: Swelling present. No erythema or ecchymosis. Normal range of motion. Tenderness present.     Right lower  leg: No swelling or tenderness.     Comments: Moves all extremities equally and without difficulty.  Full range of motion of the bilateral knees and normal gait.  Mild, generalized tenderness of the right knee without erythema or increased warmth.  Skin:    General: Skin is warm and dry.     Capillary Refill: Capillary refill takes less than 2 seconds.  Neurological:     Mental Status: He is alert.     GCS: GCS eye subscore is 4. GCS verbal subscore is 5. GCS motor subscore is 6.     Comments: Speech is clear and goal oriented.  Psychiatric:        Mood and Affect: Mood normal.     ED Results / Procedures / Treatments   Labs (all labs ordered are listed, but only abnormal results are displayed) Labs Reviewed  URINALYSIS, COMPLETE (UACMP) WITH MICROSCOPIC - Abnormal; Notable for the following components:      Result Value   Glucose, UA >=500 (*)    Protein, ur 30 (*)    All other components within normal limits  CBG MONITORING, ED - Abnormal; Notable for the following components:   Glucose-Capillary 331 (*)    All other components within normal limits  RESP PANEL BY RT-PCR (FLU A&B, COVID) ARPGX2  COMPREHENSIVE METABOLIC PANEL  ETHANOL  RAPID URINE DRUG SCREEN, HOSP PERFORMED  CBC WITH DIFFERENTIAL/PLATELET  ACETAMINOPHEN LEVEL  SALICYLATE LEVEL     Radiology DG Knee Complete 4 Views Right  Result Date: 09/24/2020 CLINICAL DATA:  Pain and swelling of the right knee EXAM: RIGHT KNEE - COMPLETE 4+ VIEW COMPARISON:  09/27/2006 FINDINGS: No evidence of fracture, dislocation, or joint effusion. Mild infrapatellar soft tissue thickening. No gas or opaque foreign body. Mild arterial calcification. Minimal marginal spurring without joint narrowing. Superior patellar enthesophytes. IMPRESSION: Mild anterior soft tissue swelling.  No acute osseous finding. Electronically Signed   By: 09/29/2006 M.D.   On: 09/24/2020 05:43    Procedures Procedures   Medications Ordered in  ED  Medications - No data to display  ED Course  I have reviewed the triage vital signs and the nursing notes.  Pertinent labs & imaging results that were available during my care of the patient were reviewed by me and considered in my medical decision making (see chart for details).    MDM Rules/Calculators/A&P                           Patient presents with complaints of right knee pain.  Plain films without acute abnormality aside from mild soft tissue swelling.  Exam reassuring without evidence of septic joint.  No open wounds.  Patient ambulatory without difficulty.  Patient unable to clearly or concisely give complaint tonight.  Flight of ideas with rapid and pressured speech.  Does admit to hallucinations.  Denies suicidal or homicidal ideation.  Patient recently discharged after admission for DKA.  Medical clearance work-up pending.  Patient will need TTS evaluation for safety of discharge.  No further work-up needed for patient's right knee pain.  5:50 AM At shift change care was transferred to [facility] who will follow pending studies, re-evaulate and determine disposition.    Final Clinical Impression(s) / ED Diagnoses Final diagnoses:  Chronic pain of right knee  Schizophrenia, unspecified type [facility])    Rx / DC Orders ED Discharge Orders    None       Mardene Sayer Boyd Kerbs 09/24/20 1660    Zadie Rhine, MD 09/24/20 [phone]

## 2020-09-24 NOTE — ED Provider Notes (Signed)
Care handoff received from [facility] PA-C at shift change please see previous provider note for full details of visit.  In short 65 year old male is pending TTS evaluation for mental health problems.  Denies any suicidal homicidal ideations but does endorse unspecified hallucinations, rapid pressured speech.  Patient does have a history of diabetes currently pending medical screening labs.  Pending no emergent derangements plan for medical clearance and TTS evaluation.  Physical Exam  BP (!) 152/81 (BP Location: Right Arm)   Pulse 86   Temp 98.8 F (37.1 C) (Oral)   Resp 18   Ht 6\' 1"  (1.854 m)   Wt 111 kg   SpO2 100%   BMI 32.29 kg/m   Physical Exam Constitutional:      General: He is not in acute distress.    Appearance: Normal appearance. He is well-developed. He is not ill-appearing or diaphoretic.  HENT:     Head: Normocephalic and atraumatic.  Eyes:     General: Vision grossly intact. Gaze aligned appropriately.     Pupils: Pupils are equal, round, and reactive to light.  Neck:     Trachea: Trachea and phonation normal.  Pulmonary:     Effort: Pulmonary effort is normal. No respiratory distress.  Abdominal:     General: There is no distension.     Palpations: Abdomen is soft.     Tenderness: There is no abdominal tenderness. There is no guarding or rebound.  Musculoskeletal:        General: Normal range of motion.     Cervical back: Normal range of motion.     Comments: Right knee: No TTP. No significant effusion or soft tissue swelling.  No erythema.  No fluctuance.  Nonweightbearing ROM intact without pain.  Left knee well-healed surgical scar.  Skin:    General: Skin is warm and dry.  Neurological:     Mental Status: He is alert.     GCS: GCS eye subscore is 4. GCS verbal subscore is 5. GCS motor subscore is 6.     Comments: Speech is clear and goal oriented, follows commands Major Cranial nerves without deficit, no facial droop Moves extremities without  ataxia, coordination intact     ED Course/Procedures     Procedures  MDM  I reviewed and interpreted labs which include:  CBG hyperglycemia 331 COVID/influenza panel negative UDS negative Urinalysis shows glucose and protein.  No ketonuria CBC without leukocytosis or thrombocytopenia.  Mild anemia 12.3 appears baseline. CMP shows no emergent electrolyte derangement or LFT elevations.  Creatinine of 1.39 appears baseline.  BUN 28 appears baseline.  Patient does have hyperglycemia 340 but bicarb is normal and no anion gap. Ethanol negative.  Patient does not appear to be in withdrawal. Salicylate and Tylenol levels negative. - I reevaluate the patient he is resting comfortably bed no acute distress asking for some breakfast and coffee.  He reports that he is feeling well at this time.  He did express concern for some knee pain. I agree with the previous provider that this does not appear to be a septic joint. Also low suspicion for DVT, compartment syndrome or other emergent pathologies at the knee.  He does endorse some clicking/popping and pain at the medial joint line possible meniscal pathology, no further ER work-up regarding his knee pain is indicated at this time, outpatient ortho follow-up recommended.  Additionally patient's labs are reassuring today there is no evidence of DKA or other emergent pathologies.  Patient is awaiting TTS evaluation  at this time.  At this time there does not appear to be any evidence of an acute emergency medical condition and the patient appears stable TTS evaluation.  Patient medically cleared at this time.  Case discussed with Dr. Rodena Medin who agrees with plan.  Note: Portions of this report may have been transcribed using voice recognition software. Every effort was made to ensure accuracy; however, inadvertent computerized transcription errors may still be present.     Bill Salinas, PA-C 09/24/20 0913    Wynetta Fines, MD 09/25/20  [phone]

## 2020-09-24 NOTE — Discharge Instructions (Signed)
For your behavioral health needs, you are advised to continue treatment with the [facility]: ° °     [facility] °     [address] °     [place], [place] [address] °     [phone] °

## 2020-09-24 NOTE — Consult Note (Addendum)
  Scott Vanderveer is a 65 y.o. male patient admitted with right knee pain. Patient reported having "mental hygiene problems" and presented with pressured speech. Per chart review multiple recent ED encounters; followed by [facility].   On assessment patient presents tangential and demanding; states he is not taking any medications and refused "any injection". Patient began making demands for "[place] food and water".   Collateral: [facility]: Judeth Cornfield [phone] States patient has been in and out of hospitals, facilities due to behaviors. Patient has been refusing Haldol Deconoate IM 200 mg injection. States patient's current presentation is at baseline and team is currently working to get patient housing. Patient is his own legal guardian.   HPI:   Araf Clugston is a 65 year old male with a psychiatric history of schizoaffective disorder, bipolar type who presented to [facility] for worsening right knee pain and endorsed "mental hygiene problems". UDS (-).  Past Psychiatric History:   -schizoaffective disorder, bipolar type  -drug-induced mood disorder  Plan:   -Contact ACTT Team for care management, discharge, and   safety planning.   -Restart psychotropic medications:    -offer previously prescribed LAI (previously on Haldol    Dec IM); pt refused, ACTT Team notified  -Discharge patient to care of [facility] for   further management of care and needs.   Disposition: Patient does not meet criteria for psychiatric inpatient admission. Supportive therapy provided about ongoing stressors. Discussed crisis plan, support from social network, calling 911, coming to the Emergency Department, and calling Suicide Hotline. [facility] [place]) notified with plan to make contact with patient upon discharge.

## 2020-09-24 NOTE — ED Notes (Signed)
Pt in room alone, speaking to himself and laughing loudly

## 2020-09-24 NOTE — ED Triage Notes (Signed)
Pt has been to [facility] and [facility] tonight and has refused any help at both places Pt appears to be manic at this time

## 2020-09-24 NOTE — ED Notes (Signed)
Upon first initial encounter with pt, pt was opening cabinets and messing with anything he saw. Pt digging in glove boxes and dropping items on floor intentionally. Although pt was pleasant towards staff. This tech locked all cabinets as precaution. Pt asked for coffee, 2% milk, and splenda. Pt provided that at this time. Pt asks for breakfast tray but requests no pork. Pt often talks to himself.

## 2020-09-24 NOTE — ED Notes (Signed)
Pt still refusing to change into scrubs, placed pt belongings (except for clothing that pt is still wearing) in 2 pt belonging bags in cabinet 9-12 by nursing station

## 2020-09-24 NOTE — ED Triage Notes (Signed)
Pt to ED with MULTIPLE medical complaints. Pt was seen at 2 other hospitals today for the same. Pt c/o R knee pain, pack pain, cataracts, hallucinations, "Medication hygeine problems". Pt states he was kicked out of the shelter. Flight of ideas in triage. Denies any SI/HI. VSS, NADN.

## 2020-09-24 NOTE — ED Notes (Signed)
Pt refuses to change into purple scrubs. Pt states "I checked in for knee surgery, I'm not putting those on"

## 2020-10-07 ENCOUNTER — Ambulatory Visit (HOSPITAL_COMMUNITY)
Admission: EM | Admit: 2020-10-07 | Discharge: 2020-10-07 | Disposition: A | Discharge status: Home / Self Care | Payer: Medicaid Other | Attending: Student in an Organized Health Care Education/Training Program | Admitting: Student in an Organized Health Care Education/Training Program

## 2020-10-07 ENCOUNTER — Other Ambulatory Visit: Payer: Self-pay

## 2020-10-07 DIAGNOSIS — F250 Schizoaffective disorder, bipolar type: Secondary | ICD-10-CM | POA: Insufficient documentation

## 2020-10-07 DIAGNOSIS — Z5900 Homelessness unspecified: Secondary | ICD-10-CM | POA: Insufficient documentation

## 2020-10-07 DIAGNOSIS — Z9114 Patient's other noncompliance with medication regimen: Secondary | ICD-10-CM | POA: Insufficient documentation

## 2020-10-07 MED ORDER — METFORMIN HCL ER (OSM) 1000 MG PO TB24: 1000.0000 mg | ORAL_TABLET | Freq: Every day | ORAL | 0 refills | Status: DC

## 2020-10-07 MED ORDER — METFORMIN HCL ER 500 MG PO TB24
1000.0000 mg | ORAL_TABLET | Freq: Every day | ORAL | Status: DC
  Filled 2020-10-07: qty 14

## 2020-10-07 MED ORDER — OLANZAPINE 5 MG PO TABS
5.0000 mg | ORAL_TABLET | Freq: Three times a day (TID) | ORAL | Status: DC
  Filled 2020-10-07: qty 21

## 2020-10-07 MED ORDER — DIVALPROEX SODIUM 500 MG PO DR TAB
500.0000 mg | DELAYED_RELEASE_TABLET | Freq: Three times a day (TID) | ORAL | Status: DC
  Filled 2020-10-07: qty 21

## 2020-10-07 MED ORDER — BENZTROPINE MESYLATE 1 MG PO TABS
ORAL_TABLET | ORAL | Status: AC
  Filled 2020-10-07: qty 2

## 2020-10-07 MED ORDER — BENZTROPINE MESYLATE 2 MG PO TABS: 2.0000 mg | ORAL_TABLET | Freq: Two times a day (BID) | ORAL | 0 refills | Status: DC

## 2020-10-07 MED ORDER — OLANZAPINE 5 MG PO TABS
ORAL_TABLET | ORAL | Status: AC
  Administered 2020-10-07: 5 mg
  Filled 2020-10-07: qty 1

## 2020-10-07 MED ORDER — DIVALPROEX SODIUM ER 500 MG PO TB24
ORAL_TABLET | ORAL | Status: AC
  Administered 2020-10-07: 500 mg
  Filled 2020-10-07: qty 1

## 2020-10-07 MED ORDER — DIVALPROEX SODIUM 500 MG PO DR TAB: 500.0000 mg | DELAYED_RELEASE_TABLET | Freq: Three times a day (TID) | ORAL | 0 refills | Status: DC

## 2020-10-07 MED ORDER — BENZTROPINE MESYLATE 1 MG PO TABS
2.0000 mg | ORAL_TABLET | Freq: Two times a day (BID) | ORAL | Status: DC
  Administered 2020-10-07: 2 mg via ORAL
  Filled 2020-10-07: qty 28

## 2020-10-07 MED ORDER — OLANZAPINE 5 MG PO TABS: 5.0000 mg | ORAL_TABLET | Freq: Three times a day (TID) | ORAL | 0 refills | Status: DC

## 2020-10-07 MED ORDER — METFORMIN HCL 500 MG PO TABS
ORAL_TABLET | ORAL | Status: AC
  Administered 2020-10-07: 500 mg
  Filled 2020-10-07: qty 1

## 2020-10-07 NOTE — Discharge Instructions (Signed)
Patient is instructed to take all prescribed medications as recommended. Report any side effects or adverse reactions to your outpatient psychiatrist. Patient is instructed to abstain from alcohol and illegal drugs while on prescription medications. In the event of worsening symptoms, patient is instructed to call the crisis hotline, 911, or go to the nearest emergency department for evaluation and treatment.   

## 2020-10-07 NOTE — ED Provider Notes (Signed)
[facility]al [facility]gent [facility] Medical Screening Exam  Patient Name: Dylan Jordan MRN: [id number] Date of Evaluation: 10/07/20 Chief Complaint:   homeless and no medications Diagnosis:  Final diagnoses:  Schizoaffective disorder, bipolar type (HCC)    History of Present illness: Dylan Jordan is a 65 y.o. male w. Hx of refractory schizoaffective disorder, bipolar type. Patient presented voluntarily with the [employer] after he called them.   On assessment patient is very irritable and yelling at staff. Patient reports that he is homeless and was told that he could come and stay at [facility] for a few days. . Patient is informed that this is not true and patient gets very upset and irate. Patient reports "I am mentally ill! I need some medications."Patient also reports that he has not been sleeping well. Patient carried unfilled prescriptions of Cogentin 2mg  BID, Zyprexa 5mg  TID, Metformin 100mg  , Depakote 500mg  BID. Patient reports that he would at least like these medications. Patient reports that he is hearing the voices of his family members and they are annoying him. Patient denies SI, HI, and VH. Patient reports that he has not taken in his meds in 4-5 weeks and then at another point says he has not taken them for 2 weeks because he has been in jail for 2 weeks. Patient reports he has not seen his ACT team in 2.5 weeks.  Attempts were made to contact his [facility] but after 2 attempts and leaving a message at [phone] no one responded.    Psychiatric Specialty Exam  Presentation  General Appearance:Casual  Eye Contact:Fair  Speech:Slurred  Speech Volume:Increased  Handedness:No data recorded  Mood and Affect  Mood:Irritable  Affect:Congruent   Thought Process  Thought Processes:Goal Directed  Descriptions of Associations:Intact  Orientation:Full (Time, Place and Person)  Thought Content:Logical    Hallucinations:Auditory  Ideas of Reference:None  Suicidal  Thoughts:No  Homicidal Thoughts:No   Sensorium  Memory:Immediate Good; Recent Fair; Remote Fair  Judgment:Impaired  Insight:Shallow   Executive Functions  Concentration:Fair  Attention Span:Fair  Recall:Fair  Fund of Knowledge:Fair  Language:Fair   Psychomotor Activity  Psychomotor Activity:Normal   Assets  Assets:Desire for Improvement; Resilience   Sleep  Sleep:Poor  Number of hours: No data recorded  No data recorded  Physical Exam: Physical Exam Constitutional:      Appearance: Normal appearance.  HENT:     Head: Normocephalic and atraumatic.     Nose: Nose normal.  Eyes:     Extraocular Movements: Extraocular movements intact.     Pupils: Pupils are equal, round, and reactive to light.  Cardiovascular:     Rate and Rhythm: Normal rate.     Pulses: Normal pulses.  Pulmonary:     Effort: Pulmonary effort is normal.  Musculoskeletal:        General: Normal range of motion.  Skin:    General: Skin is warm and dry.  Neurological:     General: No focal deficit present.     Mental Status: He is alert.    Review of Systems  Constitutional: Negative for chills and fever.  HENT: Negative for hearing loss.   Eyes: Negative for blurred vision.  Respiratory: Negative for cough and wheezing.   Cardiovascular: Negative for chest pain.  Gastrointestinal: Negative for abdominal pain.  Musculoskeletal: Positive for back pain and myalgias.  Neurological: Negative for dizziness.  Psychiatric/Behavioral: Negative for depression and suicidal ideas.   Blood pressure 97/80, pulse 80, temperature 98.6 F (37 C), temperature source Oral, resp. rate 18, SpO2  100 %. There is no height or weight on file to calculate BMI.  Musculoskeletal: Strength & Muscle Tone: within normal limits Gait & Station: walks well with cane Patient leans: forward and on cane   [facility] MSE Discharge Disposition for Follow up and Recommendations: Based on my evaluation the patient  does not appear to have an emergency medical condition and can be discharged with resources and follow up care in outpatient services for Medication Management Schizoaffective disorder, bipolar type Patient has been seen in multiple ED's since his last discharge from a mental health facility with [facility] 06/20/2020. At that time it was determined that patient need dual antipsychotic therapy; including haldol dec and Geodon. However patient's medication lists at each of his ED visits were drastically different and contained different antipsychotics and mood stabilizers as well as antidepressants. On assessment today patient presented with a printed list of unfilled prescriptions that was from within the last month. This was the list used to provide patient with medication. Patient was taken by [facility] to [facility] as his main complaint and reason for coming was for housing.  - If patient ACTT team calls back will recommend they follow up with patient - Patient should continue on his most recent regimen - Provder writing this note did send patient's prescriptions electronically to the pharmacy on file should patient decide he wants to fill his medications in the case that he was having trouble due to the current prescriptions being paper.   PGY-1 Bobbye Morton, MD 10/07/2020, 9:10 AM

## 2020-10-07 NOTE — Discharge Summary (Signed)
Dylan Jordan to be D/C'd home per MD order. Discussed with the patient and all questions fully answered.  Administered medications per MD order with no incident. An After Visit Summary was printed and given to the patient. Patient escorted out and D/C home via [employer].  [name]  10/07/2020 9:28 AM

## 2020-10-07 NOTE — BH Assessment (Signed)
Patient reports voluntary with [employer] requesting medications. Patient reports being off medication for past 4-5 weeks. Patient denies SI/ HI/ AVH or any substance use . Patient reports that he has impaired vision , walk with a cane due to needed a knee replacement and arthritis. Patient is agitated due to lack of sleep . Patient is routine .

## 2020-10-08 ENCOUNTER — Encounter: Payer: Self-pay | Admitting: *Deleted

## 2020-10-08 ENCOUNTER — Telehealth: Payer: Self-pay | Admitting: *Deleted

## 2020-10-08 NOTE — Congregational Nurse Program (Signed)
  Dept: [phone]   Congregational Nurse Program Note  Date of Encounter: 10/08/2020  Past Medical History: Past Medical History:  Diagnosis Date  . Anemia   . Asthma   . Diabetes mellitus without complication (HCC)   . Gout   . Hyperlipidemia   . Hyperosmolar hyperglycemic coma due to diabetes mellitus without ketoacidosis (HCC)   . Hypertension   . Schizophrenia (HCC) 07/06/2018    Encounter Details:  CNP Questionnaire - 10/08/20 1158      Questionnaire   Do you give verbal consent to treat you today? Yes    Visit Setting Church or Organization    Location Patient Served At [facility]    Patient Status Homeless    Medical Provider Yes    Insurance Medicaid    Intervention Refer;Support    Housing/Utilities No permanent housing    Food Have food insecurities    Referrals Other;Behavioral/Mental Health Provider   see note         Client came into [facility] office and asked to have cbg checked and vitals. Vitals 144/89 pule 85 and CBG 298. Client reports he takes several medications and had a bag of medications with him. He had medications in a weekly medication container that he took today. Client is difficult to understand and has several missing teeth. While talking with client discovered he has an ACT team. Called number [phone] in chart and spoke with [name] who reports she is following client. Gave vitals and CBG information to her with verbal consent from client. She or someone from the ACT is to followup with him today. Client was given housing rooming numbers and a food resource booklet. When he left, he followed another from [facility] to [employer] for lunch. Client reports he sometimes has difficulty getting in touch with ACT team and [name]. Gave him ACT team number and gave [facility] information for urgent care if he needed assistance and he could not reach anyone at [facility] or ACT team. Nira Conn RN CN [phone]

## 2020-10-08 NOTE — Telephone Encounter (Signed)
Called ACT team with vitals and cbg information-see CN note

## 2020-10-16 ENCOUNTER — Encounter (HOSPITAL_COMMUNITY): Payer: Self-pay

## 2020-10-16 ENCOUNTER — Emergency Department (HOSPITAL_COMMUNITY)
Admission: EM | Admit: 2020-10-16 | Discharge: 2020-10-16 | Disposition: A | Discharge status: Home / Self Care | Payer: Medicaid Other | Attending: Emergency Medicine | Admitting: Emergency Medicine

## 2020-10-16 ENCOUNTER — Emergency Department (HOSPITAL_COMMUNITY): Payer: Medicaid Other

## 2020-10-16 ENCOUNTER — Other Ambulatory Visit: Payer: Self-pay

## 2020-10-16 DIAGNOSIS — E1165 Type 2 diabetes mellitus with hyperglycemia: Secondary | ICD-10-CM | POA: Insufficient documentation

## 2020-10-16 DIAGNOSIS — E1140 Type 2 diabetes mellitus with diabetic neuropathy, unspecified: Secondary | ICD-10-CM | POA: Diagnosis not present

## 2020-10-16 DIAGNOSIS — E1122 Type 2 diabetes mellitus with diabetic chronic kidney disease: Secondary | ICD-10-CM | POA: Diagnosis not present

## 2020-10-16 DIAGNOSIS — Z794 Long term (current) use of insulin: Secondary | ICD-10-CM | POA: Diagnosis not present

## 2020-10-16 DIAGNOSIS — J45909 Unspecified asthma, uncomplicated: Secondary | ICD-10-CM | POA: Diagnosis not present

## 2020-10-16 DIAGNOSIS — Z79899 Other long term (current) drug therapy: Secondary | ICD-10-CM | POA: Diagnosis not present

## 2020-10-16 DIAGNOSIS — Z96652 Presence of left artificial knee joint: Secondary | ICD-10-CM | POA: Diagnosis not present

## 2020-10-16 DIAGNOSIS — Z20822 Contact with and (suspected) exposure to covid-19: Secondary | ICD-10-CM | POA: Insufficient documentation

## 2020-10-16 DIAGNOSIS — E1169 Type 2 diabetes mellitus with other specified complication: Secondary | ICD-10-CM | POA: Diagnosis not present

## 2020-10-16 DIAGNOSIS — R739 Hyperglycemia, unspecified: Secondary | ICD-10-CM | POA: Diagnosis present

## 2020-10-16 DIAGNOSIS — I129 Hypertensive chronic kidney disease with stage 1 through stage 4 chronic kidney disease, or unspecified chronic kidney disease: Secondary | ICD-10-CM | POA: Insufficient documentation

## 2020-10-16 DIAGNOSIS — E785 Hyperlipidemia, unspecified: Secondary | ICD-10-CM | POA: Insufficient documentation

## 2020-10-16 DIAGNOSIS — N189 Chronic kidney disease, unspecified: Secondary | ICD-10-CM | POA: Diagnosis not present

## 2020-10-16 LAB — CBC
HCT: 39 % (ref 39.0–52.0)
Hemoglobin: 12.1 g/dL — ABNORMAL LOW (ref 13.0–17.0)
MCH: 26.7 pg (ref 26.0–34.0)
MCHC: 31 g/dL (ref 30.0–36.0)
MCV: 86.1 fL (ref 80.0–100.0)
Platelets: 225 10*3/uL (ref 150–400)
RBC: 4.53 MIL/uL (ref 4.22–5.81)
RDW: 14 % (ref 11.5–15.5)
WBC: 6.9 10*3/uL (ref 4.0–10.5)
nRBC: 0 % (ref 0.0–0.2)

## 2020-10-16 LAB — URINALYSIS, ROUTINE W REFLEX MICROSCOPIC
Bacteria, UA: NONE SEEN
Bilirubin Urine: NEGATIVE
Glucose, UA: >=500 mg/dL — AB
Hgb urine dipstick: NEGATIVE
Ketones, ur: NEGATIVE mg/dL
Leukocytes,Ua: NEGATIVE
Nitrite: NEGATIVE
Protein, ur: NEGATIVE mg/dL
Specific Gravity, Urine: 1.018 (ref 1.005–1.030)
pH: 6 (ref 5.0–8.0)

## 2020-10-16 LAB — I-STAT VENOUS BLOOD GAS, ED
Acid-Base Excess: 4 mmol/L — ABNORMAL HIGH (ref 0.0–2.0)
Bicarbonate: 28.9 mmol/L — ABNORMAL HIGH (ref 20.0–28.0)
Calcium, Ion: 1.12 mmol/L — ABNORMAL LOW (ref 1.15–1.40)
HCT: 39 % (ref 39.0–52.0)
Hemoglobin: 13.3 g/dL (ref 13.0–17.0)
O2 Saturation: 94 %
Potassium: 5.2 mmol/L — ABNORMAL HIGH (ref 3.5–5.1)
Sodium: 129 mmol/L — ABNORMAL LOW (ref 135–145)
TCO2: 30 mmol/L (ref 22–32)
pCO2, Ven: 45.5 mmHg (ref 44.0–60.0)
pH, Ven: 7.41 (ref 7.250–7.430)
pO2, Ven: 70 mmHg — ABNORMAL HIGH (ref 32.0–45.0)

## 2020-10-16 LAB — BASIC METABOLIC PANEL
Anion gap: 14 (ref 5–15)
Anion gap: 7 (ref 5–15)
BUN: 19 mg/dL (ref 8–23)
BUN: 16 mg/dL (ref 8–23)
CO2: 23 mmol/L (ref 22–32)
CO2: 30 mmol/L (ref 22–32)
Calcium: 9.1 mg/dL (ref 8.9–10.3)
Calcium: 9.1 mg/dL (ref 8.9–10.3)
Chloride: 91 mmol/L — ABNORMAL LOW (ref 98–111)
Chloride: 102 mmol/L (ref 98–111)
Creatinine, Ser: 1.47 mg/dL — ABNORMAL HIGH (ref 0.61–1.24)
Creatinine, Ser: 1.32 mg/dL — ABNORMAL HIGH (ref 0.61–1.24)
GFR, Estimated: 53 mL/min — ABNORMAL LOW (ref >60)
GFR, Estimated: >60 mL/min (ref >60)
Glucose, Bld: 701 mg/dL (ref 70–99)
Glucose, Bld: 205 mg/dL — ABNORMAL HIGH (ref 70–99)
Potassium: 5.2 mmol/L — ABNORMAL HIGH (ref 3.5–5.1)
Potassium: 3.5 mmol/L (ref 3.5–5.1)
Sodium: 128 mmol/L — ABNORMAL LOW (ref 135–145)
Sodium: 139 mmol/L (ref 135–145)

## 2020-10-16 LAB — ETHANOL: Alcohol, Ethyl (B): <10 mg/dL (ref <10)

## 2020-10-16 LAB — HEPATIC FUNCTION PANEL
ALT: 19 U/L (ref 0–44)
AST: 12 U/L — ABNORMAL LOW (ref 15–41)
Albumin: 3.2 g/dL — ABNORMAL LOW (ref 3.5–5.0)
Alkaline Phosphatase: 76 U/L (ref 38–126)
Bilirubin, Direct: <0.1 mg/dL (ref 0.0–0.2)
Total Bilirubin: 0.3 mg/dL (ref 0.3–1.2)
Total Protein: 7 g/dL (ref 6.5–8.1)

## 2020-10-16 LAB — CBG MONITORING, ED
Glucose-Capillary: >600 mg/dL (ref 70–99)
Glucose-Capillary: 522 mg/dL (ref 70–99)
Glucose-Capillary: 253 mg/dL — ABNORMAL HIGH (ref 70–99)
Glucose-Capillary: 386 mg/dL — ABNORMAL HIGH (ref 70–99)

## 2020-10-16 LAB — MAGNESIUM: Magnesium: 2.4 mg/dL (ref 1.7–2.4)

## 2020-10-16 LAB — RESP PANEL BY RT-PCR (FLU A&B, COVID) ARPGX2
Influenza A by PCR: NEGATIVE
Influenza B by PCR: NEGATIVE
SARS Coronavirus 2 by RT PCR: NEGATIVE

## 2020-10-16 LAB — OSMOLALITY: Osmolality: 318 mOsm/kg — ABNORMAL HIGH (ref 275–295)

## 2020-10-16 LAB — BETA-HYDROXYBUTYRIC ACID: Beta-Hydroxybutyric Acid: 0.07 mmol/L (ref 0.05–0.27)

## 2020-10-16 MED ORDER — INSULIN REGULAR(HUMAN) IN NACL 100-0.9 UT/100ML-% IV SOLN
INTRAVENOUS | Status: DC
  Administered 2020-10-16: 13 [IU]/h via INTRAVENOUS
  Administered 2020-10-16: 9 [IU]/h via INTRAVENOUS
  Filled 2020-10-16: qty 100

## 2020-10-16 MED ORDER — SODIUM CHLORIDE 0.9 % IV BOLUS
1000.0000 mL | Freq: Once | INTRAVENOUS | Status: AC
  Administered 2020-10-16: 1000 mL via INTRAVENOUS

## 2020-10-16 MED ORDER — DEXTROSE 50 % IV SOLN: 0.0000 mL | INTRAVENOUS | Status: DC | PRN

## 2020-10-16 MED ORDER — METFORMIN HCL ER (OSM) 1000 MG PO TB24: 1000.0000 mg | ORAL_TABLET | Freq: Every day | ORAL | 0 refills | Status: DC

## 2020-10-16 MED ORDER — LACTATED RINGERS IV SOLN: INTRAVENOUS | Status: DC

## 2020-10-16 MED ORDER — DEXTROSE IN LACTATED RINGERS 5 % IV SOLN: INTRAVENOUS | Status: DC

## 2020-10-16 NOTE — ED Notes (Signed)
EDP made aware of critical 701 CBG

## 2020-10-16 NOTE — Discharge Instructions (Addendum)
Please make sure you are checking your sugar and taking your medications.  Please try to eat a low carb diet I have given you a prescription for your metformin today.  Please get this filled. You may receive a call from social work.  I have given you a list of local resources and shelters. Please follow up with the health and wellness center.

## 2020-10-16 NOTE — ED Notes (Signed)
EDP made aware that pt is a hard stick & his only IV will not give back blood. IV team consult has been put in d/t incompatible med orders.

## 2020-10-16 NOTE — ED Triage Notes (Signed)
Patient arrived by [facility] with complaint of blood sugar running high.EMS reports that their meter reads high. Patient complains of right ankle pain and denies trauma and states since he lives at shelter hasnt taken any diabetic meds. Staff at shelter patient at normal baseline

## 2020-10-16 NOTE — ED Provider Notes (Signed)
[facility] EMERGENCY DEPARTMENT Provider Note   CSN: [id number] Arrival date & time: 10/16/20  1516     History No chief complaint on file.   Dylan Jordan is a 65 y.o. male with extensive past medical history as listed below, does include schizophrenia, hypertension, diabetes, chronic cough, who presents today for evaluation of high blood sugar.  Level 5 caveat as patient is poor historian and speech is very difficult to understand.  He tells me that he has been out of his long-acting and short acting insulin for about 10 days.  He states that he is also out of his metformin.  He denies any physical symptoms today.  Per staff at shelter patient's speech and behavior is his normal baseline  He was seen by [facility] on 10/08/2020, that point it was noted that his speech was very hard to understand and his many missing teeth were thought to be a contributor. Chart review shows that he has 8 other emergency room visits in the past month through multiple hospitals in the area.  Patient is unable to tell me what medicines he is supposed to be taking, and the doses.   HPI     Past Medical History:  Diagnosis Date  . Anemia   . Asthma   . Diabetes mellitus without complication (HCC)   . Gout   . Hyperlipidemia   . Hyperosmolar hyperglycemic coma due to diabetes mellitus without ketoacidosis (HCC)   . Hypertension   . Schizophrenia (HCC) 07/06/2018    Patient Active Problem List   Diagnosis Date Noted  . Hyperkalemia, diminished renal excretion 03/29/2020  . Hyperkalemia   . Schizoaffective disorder, bipolar type (HCC) 02/21/2019  . Hallucinations   . Anxiety 09/18/2018  . Swelling of both lower extremities 09/13/2018  . Type 2 diabetes mellitus without complication, with long-term current use of insulin (HCC)   . Anemia   . Hyperlipidemia 12/18/2017  . 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 Intermittent chest pain  11/25/2017  . Allergic rhinitis with a nonallergic component 07/30/2017  . Food allergy 07/30/2017  . Chronic gout involving toe of left foot without tophus 05/18/2017  . Hemorrhoids 05/04/2017  . Haloperidol adverse reaction 04/16/2017  . Chronic low back pain 01/03/2017  . Chronic cough 06/30/2016  . Drug-induced mood disorder (HCC) 06/14/2016  . Type 2 diabetes mellitus with diabetic neuropathy (HCC) 05/26/2016  . Iron deficiency anemia 05/26/2016  . Chronic kidney disease 05/26/2016  . HTN (hypertension) 05/26/2016  . Tobacco abuse 05/26/2016  . History of substance abuse (HCC) 05/26/2016  . History of alcohol abuse 05/26/2016  . Mild persistent asthma 05/26/2016    Past Surgical History:  Procedure Laterality Date  . REPLACEMENT TOTAL KNEE Left   . TOE SURGERY         Family History  Adopted: Yes  Problem Relation Age of Onset  . Allergic rhinitis Neg Hx   . Angioedema Neg Hx   . Asthma Neg Hx   . Atopy Neg Hx   . Eczema Neg Hx   . Immunodeficiency Neg Hx   . Urticaria Neg Hx     Social History   Tobacco Use  . Smoking status: Current Some Day Smoker    Packs/day: 0.30    Years: 45.00    Pack years: 13.50    Types: Cigarettes    Start date: 05/16/1971  . Smokeless tobacco: Never Used  . Tobacco comment: Decreased Intake to 10 cigarettes per week  or less.  Vaping Use  . Vaping Use: Never used  Substance Use Topics  . Alcohol use: No  . Drug use: No    Home Medications Prior to Admission medications   Medication Sig Start Date End Date Taking? Authorizing Provider  albuterol (PROVENTIL HFA;VENTOLIN HFA) 108 (90 Base) MCG/ACT inhaler Inhale 1-2 puffs into the lungs every 6 (six) hours as needed for wheezing or shortness of breath. 03/25/18   Bobbitt, Heywood Iles, MD  amLODipine (NORVASC) 10 MG tablet Take 10 mg by mouth daily.    [provider]  benztropine (COGENTIN) 2 MG tablet Take 1 tablet (2 mg total) by mouth 2 (two) times daily. 10/07/20    Bobbye Morton, MD  desmopressin (DDAVP) 0.2 MG tablet Take 0.2 mg by mouth at bedtime.    [provider]  divalproex (DEPAKOTE) 500 MG DR tablet Take 1 tablet (500 mg total) by mouth every 8 (eight) hours. 10/07/20   Bobbye Morton, MD  ferrous sulfate 325 (65 FE) MG EC tablet Take 1 tablet (325 mg total) by mouth every other day. 07/14/19   Shirley, [place], DO  Insulin Glargine (LANTUS SOLOSTAR) 100 UNIT/ML Solostar Pen Inject 35 Units into the skin daily. Patient taking differently: Inject 15 Units into the skin at bedtime.  03/28/19   Dollene Cleveland, DO  insulin lispro (HUMALOG) 100 UNIT/ML injection Inject 0-10 Units into the skin in the morning and at bedtime. Glucose 70-199, 0 units 200-249, 2 units 250-299, 4 units 300-349, 6 units 350-399, 8 units 400-449, 10 units > 450 Call MD 02/18/19   [provider]  melatonin 5 MG TABS Take 5 mg by mouth at bedtime.    [provider]  metformin (FORTAMET) 1000 MG (OSM) 24 hr tablet Take 1 tablet (1,000 mg total) by mouth daily. 10/16/20 11/15/20  Cristina Gong, PA-C  OLANZapine (ZYPREXA) 5 MG tablet Take 1 tablet (5 mg total) by mouth 3 (three) times daily before meals. 10/07/20   Bobbye Morton, MD  omeprazole (PRILOSEC) 20 MG capsule Take 20 mg by mouth 2 (two) times daily before a meal.    [provider]  polyethylene glycol (MIRALAX / GLYCOLAX) 17 g packet Take 17 g by mouth daily as needed. Patient not taking: Reported on 03/31/2020 03/28/19   Dollene Cleveland, DO    Allergies    Chantix [varenicline tartrate], Ativan [lorazepam], Other, Pineapple, Shellfish-derived products, and Penicillins  Review of Systems   Review of Systems  Constitutional: Negative for chills and fever.  Respiratory: Positive for cough. Negative for shortness of breath.   Cardiovascular: Negative for chest pain.  Gastrointestinal: Negative for abdominal pain and nausea.  Endocrine: Positive for polydipsia and  polyuria.  Neurological: Negative for weakness and headaches.   Physical Exam Updated Vital Signs BP 132/86   Pulse 74   Temp 98.4 F (36.9 C) (Oral)   Resp 15   SpO2 97%   Physical Exam Vitals and nursing note reviewed.  Constitutional:      General: He is not in acute distress.    Appearance: He is not diaphoretic.  HENT:     Head: Normocephalic and atraumatic.     Comments: Patient is missing multiple teeth.  There is no unilateral facial droop appreciated. Eyes:     General: No scleral icterus.       Right eye: No discharge.        Left eye: No discharge.     Conjunctiva/sclera: Conjunctivae  normal.  Cardiovascular:     Rate and Rhythm: Normal rate and regular rhythm.  Pulmonary:     Effort: Pulmonary effort is normal. No respiratory distress.     Breath sounds: No stridor.  Abdominal:     General: There is no distension.  Musculoskeletal:        General: No deformity.     Cervical back: Normal range of motion.  Skin:    General: Skin is warm and dry.  Neurological:     Mental Status: He is alert.     Motor: No abnormal muscle tone.     Comments: Patient is awake and alert.  His speech is slurred and difficult to understand as he also has rapid pressured speech.  Moves all 4 extremities spontaneously.  Pupils equal round reactive to light.  Psychiatric:     Comments: Patient has bizarre affect with rapid pressured speech.  He does not appear to be responding to internal stimuli.     ED Results / Procedures / Treatments   Labs (all labs ordered are listed, but only abnormal results are displayed) Labs Reviewed  BASIC METABOLIC PANEL - Abnormal; Notable for the following components:      Result Value   Sodium 128 (*)    Potassium 5.2 (*)    Chloride 91 (*)    Glucose, Bld 701 (*)    Creatinine, Ser 1.47 (*)    GFR, Estimated 53 (*)    All other components within normal limits  CBC - Abnormal; Notable for the following components:   Hemoglobin 12.1 (*)     All other components within normal limits  URINALYSIS, ROUTINE W REFLEX MICROSCOPIC - Abnormal; Notable for the following components:   Color, Urine STRAW (*)    Glucose, UA >=500 (*)    All other components within normal limits  HEPATIC FUNCTION PANEL - Abnormal; Notable for the following components:   Albumin 3.2 (*)    AST 12 (*)    All other components within normal limits  OSMOLALITY - Abnormal; Notable for the following components:   Osmolality 318 (*)    All other components within normal limits  BASIC METABOLIC PANEL - Abnormal; Notable for the following components:   Glucose, Bld 205 (*)    Creatinine, Ser 1.32 (*)    All other components within normal limits  CBG MONITORING, ED - Abnormal; Notable for the following components:   Glucose-Capillary >600 (*)    All other components within normal limits  I-STAT VENOUS BLOOD GAS, ED - Abnormal; Notable for the following components:   pO2, Ven 70.0 (*)    Bicarbonate 28.9 (*)    Acid-Base Excess 4.0 (*)    Sodium 129 (*)    Potassium 5.2 (*)    Calcium, Ion 1.12 (*)    All other components within normal limits  CBG MONITORING, ED - Abnormal; Notable for the following components:   Glucose-Capillary 522 (*)    All other components within normal limits  CBG MONITORING, ED - Abnormal; Notable for the following components:   Glucose-Capillary 386 (*)    All other components within normal limits  CBG MONITORING, ED - Abnormal; Notable for the following components:   Glucose-Capillary 253 (*)    All other components within normal limits  RESP PANEL BY RT-PCR (FLU A&B, COVID) ARPGX2  MAGNESIUM  BETA-HYDROXYBUTYRIC ACID  ETHANOL  RAPID URINE DRUG SCREEN, HOSP PERFORMED  I-STAT BETA HCG BLOOD, ED ([facility], [facility], [facility] ONLY)    EKG  EKG Interpretation  Date/Time:  Saturday October 16 2020 17:55:11 EDT Ventricular Rate:  88 PR Interval:    QRS Duration: 89 QT Interval:  350 QTC Calculation: 424 R Axis:   -18 Text  Interpretation: Sinus rhythm Low voltage, precordial leads Left ventricular hypertrophy Confirmed by Tilden Fossaees, Aydee Mcnew [phone]) on 10/16/2020 5:57:42 PM   Radiology CT Head Wo Contrast  Result Date: 10/16/2020 CLINICAL DATA:  Mental status changes, slurred speech, unknown etiology EXAM: CT HEAD WITHOUT CONTRAST TECHNIQUE: Contiguous axial images were obtained from the base of the skull through the vertex without intravenous contrast. Sagittal and coronal MPR images reconstructed from axial data set. COMPARISON:  02/20/2019 FINDINGS: Brain: Mild atrophy. Normal ventricular morphology. No midline shift or mass effect. Mild small vessel chronic ischemic changes of deep cerebral white matter. No intracranial hemorrhage, mass lesion, or evidence of acute infarction. No extra-axial fluid collections. Vascular: No hyperdense vessels. Skull: Intact Sinuses/Orbits: Minimal opacification of frontal sinus and RIGHT ethmoid air cells. Other: N/A IMPRESSION: Atrophy with mild small vessel chronic ischemic changes of deep cerebral white matter. No acute intracranial abnormalities. Electronically Signed   By: Ulyses SouthwardMark  Boles M.D.   On: 10/16/2020 18:29   DG Chest Portable 1 View  Result Date: 10/16/2020 CLINICAL DATA:  Cough EXAM: PORTABLE CHEST 1 VIEW COMPARISON:  09/20/2020 FINDINGS: The heart size and mediastinal contours are within normal limits. Both lungs are clear. The visualized skeletal structures are unremarkable. IMPRESSION: No acute abnormality of the lungs in AP portable projection. Electronically Signed   By: Lauralyn PrimesAlex  Bibbey M.D.   On: 10/16/2020 16:58    Procedures Procedures   Medications Ordered in ED Medications  lactated ringers infusion ( Intravenous Stopped 10/16/20 2247)  dextrose 50 % solution 0-50 mL (has no administration in time range)  sodium chloride 0.9 % bolus 1,000 mL (0 mLs Intravenous Stopped 10/16/20 1956)    ED Course  I have reviewed the triage vital signs and the nursing  notes.  Pertinent labs & imaging results that were available during my care of the patient were reviewed by me and considered in my medical decision making (see chart for details).  Clinical Course as of 10/16/20 2302  Sat Oct 16, 2020  2036 Patient's beta hydroxybutyric acid is not elevated, he does not have an anion gap, he is not acidotic on VBG. Most recent CBG check was 253. Secure chatted patient's RN will discontinue IV insulin.  As he does not have a gap do not need to do IV insulin and IV glucose combo. Will repeat BMP given that earlier he was hyponatremic, hyperkalemic, hypochloremic with a creatinine of 1.47. [facility] [EH]  2146 Basic metabolic panel(!) His creatinine has improved with his GFR normalizing.  He is no longer hyperkalemic, his chloride has normalized. [EH]  2239 Spoke with patient.  He feels better.  He states that he does not want to leave as he has "nowhere to go."  I informed him that we will give him a list of shelters along with financial resource guide.  He states he needs prescriptions for his medicines.  It appears on chart review that when pharmacy tech attempted to reconcile his PTA meds he became agitated and was swearing at them therefore this was unable to be completed. I will give him a prescription for his metformin.  [EH]    Clinical Course User Index [EH] Norman ClayHammond, Amisha Pospisil W, PA-C   MDM Rules/Calculators/A&P  Patient is a 65 year old man who presents today for evaluation of hyperglycemia. With EMS and on arrival here his CBG is over 600. He tells me he has not had his medicines for about 2 weeks.  His initial BMP shows a sodium of 128, he is slightly hyperkalemic with a sodium of 52.  Chloride is low at 91, CO2 is normal at 23 glucose is significantly elevated at 701.  Creatinine is 1.47, however this appears consistent with his baseline.  With his slurred speech and him being a poor  historian CT head is obtained without intracranial hemorrhage or other acute abnormality. Per EMS staff at shelter stated that this is patient's baseline. Patient was treated in the emergency room with IV fluids, IV insulin.  After this his sugar improved into the 200s.  Beta hydroxybutyric acid is not elevated.  He does not have an anion gap, his urine did not have ketones and on his VBG his pH was 7.410. Patient does not appear to be in DKA. His osmolality is slightly elevated at 318. After treatment in the emergency room repeat BMP was obtained, his creatinine slightly improved down to 1.32, and his glucose improved from 701 down to 205.  His other electrolyte derangements resolved and patient stated that he felt better.  He did report cough, chest x-ray without acute abnormalities and COVID test and flu test is negative.  Patient's hyperglycemia and electrolyte derangements have been corrected while in the emergency room.  At this point he does not appear to require admission. He requested prescriptions for his medications, however cannot clearly tell me what he takes or the doses of the medicines other than metformin. On chart review it appears that when med tech attempted to reconcile his meds he began cursing at them limiting ability for them to reconcile the meds.  I will give him a prescription for his metformin.  Social work consult is placed.  He is given Facilities manager for shelters and financial resources.   This patient was seen as a shared visit with Dr. Madilyn Hook.    Return precautions were discussed with patient who states their understanding.  At the time of discharge patient denied any unaddressed complaints or concerns.  Patient is agreeable for discharge home.  Note: Portions of this report may have been transcribed using voice recognition software. Every effort was made to ensure accuracy; however, inadvertent computerized transcription errors may be present   Final Clinical  Impression(s) / ED Diagnoses Final diagnoses:  Type 2 diabetes mellitus with hyperglycemia, unspecified whether long term insulin use (HCC)    Rx / DC Orders ED Discharge Orders         Ordered    metformin (FORTAMET) 1000 MG (OSM) 24 hr tablet  Daily        10/16/20 2237           Cristina Gong, PA-C 10/16/20 2323    Tilden Fossa, MD 10/17/20 1241

## 2020-10-16 NOTE — ED Notes (Signed)
Pt is demanding social work consult due to his current housing status. This RN informed the pt that the request would be noted and priority was given to his hyperglycemia.

## 2020-10-16 NOTE — ED Notes (Signed)
Patient transported to CT 

## 2020-10-16 NOTE — ED Notes (Signed)
Voicemail left with the Transition of Care Team

## 2020-10-16 NOTE — ED Notes (Signed)
IV team at bedside 

## 2020-10-17 ENCOUNTER — Encounter (HOSPITAL_COMMUNITY): Payer: Self-pay | Admitting: Emergency Medicine

## 2020-10-17 ENCOUNTER — Emergency Department (HOSPITAL_COMMUNITY)
Admission: EM | Admit: 2020-10-17 | Discharge: 2020-10-17 | Disposition: A | Discharge status: Home / Self Care | Payer: Medicaid Other | Attending: Emergency Medicine | Admitting: Emergency Medicine

## 2020-10-17 ENCOUNTER — Other Ambulatory Visit: Payer: Self-pay

## 2020-10-17 ENCOUNTER — Emergency Department (HOSPITAL_COMMUNITY)
Admission: EM | Admit: 2020-10-17 | Discharge: 2020-10-17 | Disposition: A | Discharge status: Home / Self Care | Payer: Medicaid Other | Source: Home / Self Care | Attending: Emergency Medicine | Admitting: Emergency Medicine

## 2020-10-17 ENCOUNTER — Encounter (HOSPITAL_COMMUNITY): Payer: Self-pay

## 2020-10-17 ENCOUNTER — Emergency Department (HOSPITAL_COMMUNITY): Admission: EM | Admit: 2020-10-17 | Discharge: 2020-10-20 | Payer: Medicaid Other | Source: Home / Self Care

## 2020-10-17 ENCOUNTER — Emergency Department (HOSPITAL_COMMUNITY): Payer: Medicaid Other

## 2020-10-17 DIAGNOSIS — Z79899 Other long term (current) drug therapy: Secondary | ICD-10-CM | POA: Insufficient documentation

## 2020-10-17 DIAGNOSIS — N189 Chronic kidney disease, unspecified: Secondary | ICD-10-CM | POA: Insufficient documentation

## 2020-10-17 DIAGNOSIS — Z7984 Long term (current) use of oral hypoglycemic drugs: Secondary | ICD-10-CM | POA: Diagnosis not present

## 2020-10-17 DIAGNOSIS — Z96652 Presence of left artificial knee joint: Secondary | ICD-10-CM | POA: Insufficient documentation

## 2020-10-17 DIAGNOSIS — Z794 Long term (current) use of insulin: Secondary | ICD-10-CM | POA: Insufficient documentation

## 2020-10-17 DIAGNOSIS — R443 Hallucinations, unspecified: Secondary | ICD-10-CM

## 2020-10-17 DIAGNOSIS — Z20822 Contact with and (suspected) exposure to covid-19: Secondary | ICD-10-CM | POA: Insufficient documentation

## 2020-10-17 DIAGNOSIS — Y990 Civilian activity done for income or pay: Secondary | ICD-10-CM | POA: Insufficient documentation

## 2020-10-17 DIAGNOSIS — J4530 Mild persistent asthma, uncomplicated: Secondary | ICD-10-CM | POA: Insufficient documentation

## 2020-10-17 DIAGNOSIS — R440 Auditory hallucinations: Secondary | ICD-10-CM | POA: Insufficient documentation

## 2020-10-17 DIAGNOSIS — E1122 Type 2 diabetes mellitus with diabetic chronic kidney disease: Secondary | ICD-10-CM | POA: Insufficient documentation

## 2020-10-17 DIAGNOSIS — F17210 Nicotine dependence, cigarettes, uncomplicated: Secondary | ICD-10-CM | POA: Insufficient documentation

## 2020-10-17 DIAGNOSIS — F250 Schizoaffective disorder, bipolar type: Secondary | ICD-10-CM | POA: Diagnosis present

## 2020-10-17 DIAGNOSIS — R441 Visual hallucinations: Secondary | ICD-10-CM | POA: Insufficient documentation

## 2020-10-17 DIAGNOSIS — E1165 Type 2 diabetes mellitus with hyperglycemia: Secondary | ICD-10-CM | POA: Insufficient documentation

## 2020-10-17 DIAGNOSIS — I129 Hypertensive chronic kidney disease with stage 1 through stage 4 chronic kidney disease, or unspecified chronic kidney disease: Secondary | ICD-10-CM | POA: Insufficient documentation

## 2020-10-17 DIAGNOSIS — E1140 Type 2 diabetes mellitus with diabetic neuropathy, unspecified: Secondary | ICD-10-CM | POA: Insufficient documentation

## 2020-10-17 DIAGNOSIS — Z046 Encounter for general psychiatric examination, requested by authority: Secondary | ICD-10-CM | POA: Insufficient documentation

## 2020-10-17 DIAGNOSIS — Z5900 Homelessness unspecified: Secondary | ICD-10-CM | POA: Diagnosis not present

## 2020-10-17 DIAGNOSIS — R079 Chest pain, unspecified: Secondary | ICD-10-CM | POA: Insufficient documentation

## 2020-10-17 DIAGNOSIS — R112 Nausea with vomiting, unspecified: Secondary | ICD-10-CM | POA: Insufficient documentation

## 2020-10-17 DIAGNOSIS — R45851 Suicidal ideations: Secondary | ICD-10-CM | POA: Insufficient documentation

## 2020-10-17 DIAGNOSIS — E119 Type 2 diabetes mellitus without complications: Secondary | ICD-10-CM

## 2020-10-17 LAB — CBC WITH DIFFERENTIAL/PLATELET
Abs Immature Granulocytes: 0.03 10*3/uL (ref 0.00–0.07)
Abs Immature Granulocytes: 0.04 10*3/uL (ref 0.00–0.07)
Basophils Absolute: 0 10*3/uL (ref 0.0–0.1)
Basophils Absolute: 0 10*3/uL (ref 0.0–0.1)
Basophils Relative: 0 %
Basophils Relative: 0 %
Eosinophils Absolute: 0.2 10*3/uL (ref 0.0–0.5)
Eosinophils Absolute: 0.1 10*3/uL (ref 0.0–0.5)
Eosinophils Relative: 3 %
Eosinophils Relative: 2 %
HCT: 36.9 % — ABNORMAL LOW (ref 39.0–52.0)
HCT: 38 % — ABNORMAL LOW (ref 39.0–52.0)
Hemoglobin: 11.5 g/dL — ABNORMAL LOW (ref 13.0–17.0)
Hemoglobin: 11.9 g/dL — ABNORMAL LOW (ref 13.0–17.0)
Immature Granulocytes: 1 %
Immature Granulocytes: 1 %
Lymphocytes Relative: 33 %
Lymphocytes Relative: 30 %
Lymphs Abs: 2 10*3/uL (ref 0.7–4.0)
Lymphs Abs: 1.8 10*3/uL (ref 0.7–4.0)
MCH: 26.6 pg (ref 26.0–34.0)
MCH: 26.7 pg (ref 26.0–34.0)
MCHC: 31.2 g/dL (ref 30.0–36.0)
MCHC: 31.3 g/dL (ref 30.0–36.0)
MCV: 85.2 fL (ref 80.0–100.0)
MCV: 85.2 fL (ref 80.0–100.0)
Monocytes Absolute: 0.6 10*3/uL (ref 0.1–1.0)
Monocytes Absolute: 0.5 10*3/uL (ref 0.1–1.0)
Monocytes Relative: 10 %
Monocytes Relative: 8 %
Neutro Abs: 3.3 10*3/uL (ref 1.7–7.7)
Neutro Abs: 3.6 10*3/uL (ref 1.7–7.7)
Neutrophils Relative %: 53 %
Neutrophils Relative %: 59 %
Platelets: 224 10*3/uL (ref 150–400)
Platelets: 225 10*3/uL (ref 150–400)
RBC: 4.33 MIL/uL (ref 4.22–5.81)
RBC: 4.46 MIL/uL (ref 4.22–5.81)
RDW: 14 % (ref 11.5–15.5)
RDW: 14.3 % (ref 11.5–15.5)
WBC: 6.1 10*3/uL (ref 4.0–10.5)
WBC: 6.1 10*3/uL (ref 4.0–10.5)
nRBC: 0 % (ref 0.0–0.2)
nRBC: 0 % (ref 0.0–0.2)

## 2020-10-17 LAB — COMPREHENSIVE METABOLIC PANEL
ALT: 14 U/L (ref 0–44)
ALT: 18 U/L (ref 0–44)
AST: 17 U/L (ref 15–41)
AST: 14 U/L — ABNORMAL LOW (ref 15–41)
Albumin: 3.9 g/dL (ref 3.5–5.0)
Albumin: 3.5 g/dL (ref 3.5–5.0)
Alkaline Phosphatase: 47 U/L (ref 38–126)
Alkaline Phosphatase: 81 U/L (ref 38–126)
Anion gap: 7 (ref 5–15)
Anion gap: 9 (ref 5–15)
BUN: 20 mg/dL (ref 8–23)
BUN: 22 mg/dL (ref 8–23)
CO2: 29 mmol/L (ref 22–32)
CO2: 26 mmol/L (ref 22–32)
Calcium: 9.8 mg/dL (ref 8.9–10.3)
Calcium: 9.3 mg/dL (ref 8.9–10.3)
Chloride: 102 mmol/L (ref 98–111)
Chloride: 99 mmol/L (ref 98–111)
Creatinine, Ser: 1.57 mg/dL — ABNORMAL HIGH (ref 0.61–1.24)
Creatinine, Ser: 1.51 mg/dL — ABNORMAL HIGH (ref 0.61–1.24)
GFR, Estimated: 49 mL/min — ABNORMAL LOW (ref >60)
GFR, Estimated: 51 mL/min — ABNORMAL LOW (ref >60)
Glucose, Bld: 78 mg/dL (ref 70–99)
Glucose, Bld: 519 mg/dL (ref 70–99)
Potassium: 3.9 mmol/L (ref 3.5–5.1)
Potassium: 5.4 mmol/L — ABNORMAL HIGH (ref 3.5–5.1)
Sodium: 138 mmol/L (ref 135–145)
Sodium: 134 mmol/L — ABNORMAL LOW (ref 135–145)
Total Bilirubin: 0.6 mg/dL (ref 0.3–1.2)
Total Bilirubin: 0.6 mg/dL (ref 0.3–1.2)
Total Protein: 6.9 g/dL (ref 6.5–8.1)
Total Protein: 7.1 g/dL (ref 6.5–8.1)

## 2020-10-17 LAB — I-STAT VENOUS BLOOD GAS, ED
Acid-Base Excess: 4 mmol/L — ABNORMAL HIGH (ref 0.0–2.0)
Bicarbonate: 30.1 mmol/L — ABNORMAL HIGH (ref 20.0–28.0)
Calcium, Ion: 1.17 mmol/L (ref 1.15–1.40)
HCT: 36 % — ABNORMAL LOW (ref 39.0–52.0)
Hemoglobin: 12.2 g/dL — ABNORMAL LOW (ref 13.0–17.0)
O2 Saturation: 75 %
Patient temperature: 37
Potassium: 3.9 mmol/L (ref 3.5–5.1)
Sodium: 139 mmol/L (ref 135–145)
TCO2: 32 mmol/L (ref 22–32)
pCO2, Ven: 50.3 mmHg (ref 44.0–60.0)
pH, Ven: 7.385 (ref 7.250–7.430)
pO2, Ven: 41 mmHg (ref 32.0–45.0)

## 2020-10-17 LAB — TROPONIN I (HIGH SENSITIVITY): Troponin I (High Sensitivity): 5 ng/L (ref <18)

## 2020-10-17 LAB — LIPASE, BLOOD: Lipase: 27 U/L (ref 11–51)

## 2020-10-17 LAB — RAPID URINE DRUG SCREEN, HOSP PERFORMED
Amphetamines: NOT DETECTED
Barbiturates: NOT DETECTED
Benzodiazepines: NOT DETECTED
Cocaine: NOT DETECTED
Opiates: NOT DETECTED
Tetrahydrocannabinol: NOT DETECTED

## 2020-10-17 LAB — ACETAMINOPHEN LEVEL: Acetaminophen (Tylenol), Serum: <10 ug/mL — ABNORMAL LOW (ref 10–30)

## 2020-10-17 LAB — CBG MONITORING, ED
Glucose-Capillary: 489 mg/dL — ABNORMAL HIGH (ref 70–99)
Glucose-Capillary: 167 mg/dL — ABNORMAL HIGH (ref 70–99)

## 2020-10-17 LAB — ETHANOL: Alcohol, Ethyl (B): <10 mg/dL (ref <10)

## 2020-10-17 LAB — SALICYLATE LEVEL: Salicylate Lvl: <7 mg/dL — ABNORMAL LOW (ref 7.0–30.0)

## 2020-10-17 MED ORDER — MELATONIN 5 MG PO TABS
5.0000 mg | ORAL_TABLET | Freq: Every day | ORAL | Status: DC
  Administered 2020-10-17 – 2020-10-18 (×2): 5 mg via ORAL
  Filled 2020-10-17 (×3): qty 1

## 2020-10-17 MED ORDER — FERROUS SULFATE 325 (65 FE) MG PO TABS
325.0000 mg | ORAL_TABLET | ORAL | Status: DC
  Administered 2020-10-18 – 2020-10-20 (×2): 325 mg via ORAL
  Filled 2020-10-17 (×2): qty 1

## 2020-10-17 MED ORDER — DIVALPROEX SODIUM 250 MG PO DR TAB
500.0000 mg | DELAYED_RELEASE_TABLET | Freq: Once | ORAL | Status: AC
  Administered 2020-10-17: 500 mg via ORAL
  Filled 2020-10-17: qty 2

## 2020-10-17 MED ORDER — PANTOPRAZOLE SODIUM 40 MG PO TBEC
40.0000 mg | DELAYED_RELEASE_TABLET | Freq: Every day | ORAL | Status: DC
  Administered 2020-10-17 – 2020-10-20 (×4): 40 mg via ORAL
  Filled 2020-10-17 (×4): qty 1

## 2020-10-17 MED ORDER — ONDANSETRON 4 MG PO TBDP
4.0000 mg | ORAL_TABLET | Freq: Once | ORAL | Status: AC
  Administered 2020-10-17: 4 mg via ORAL
  Filled 2020-10-17: qty 1

## 2020-10-17 MED ORDER — METFORMIN HCL 500 MG PO TABS
1000.0000 mg | ORAL_TABLET | Freq: Once | ORAL | Status: AC
  Administered 2020-10-17: 1000 mg via ORAL
  Filled 2020-10-17: qty 2

## 2020-10-17 MED ORDER — INSULIN ASPART 100 UNIT/ML IJ SOLN
10.0000 [IU] | Freq: Once | INTRAMUSCULAR | Status: AC
  Administered 2020-10-17: 10 [IU] via SUBCUTANEOUS
  Filled 2020-10-17: qty 0.1

## 2020-10-17 MED ORDER — ZIPRASIDONE MESYLATE 20 MG IM SOLR
20.0000 mg | INTRAMUSCULAR | Status: DC | PRN
  Filled 2020-10-17: qty 20

## 2020-10-17 MED ORDER — OLANZAPINE 5 MG PO TBDP
5.0000 mg | ORAL_TABLET | Freq: Three times a day (TID) | ORAL | Status: DC | PRN
  Administered 2020-10-19: 5 mg via ORAL
  Filled 2020-10-17: qty 1

## 2020-10-17 MED ORDER — GABAPENTIN 300 MG PO CAPS
600.0000 mg | ORAL_CAPSULE | Freq: Two times a day (BID) | ORAL | Status: DC
  Administered 2020-10-17 – 2020-10-20 (×6): 600 mg via ORAL
  Filled 2020-10-17 (×6): qty 2

## 2020-10-17 MED ORDER — AMLODIPINE BESYLATE 5 MG PO TABS
10.0000 mg | ORAL_TABLET | Freq: Every day | ORAL | Status: DC
  Administered 2020-10-17 – 2020-10-20 (×4): 10 mg via ORAL
  Filled 2020-10-17 (×4): qty 2

## 2020-10-17 MED ORDER — INSULIN ASPART 100 UNIT/ML IJ SOLN
0.0000 [IU] | Freq: Three times a day (TID) | INTRAMUSCULAR | Status: DC
  Administered 2020-10-17: 15 [IU] via SUBCUTANEOUS
  Administered 2020-10-18: 5 [IU] via SUBCUTANEOUS
  Administered 2020-10-18: 11 [IU] via SUBCUTANEOUS
  Administered 2020-10-18: 5 [IU] via SUBCUTANEOUS
  Administered 2020-10-19: 8 [IU] via SUBCUTANEOUS
  Administered 2020-10-19: 11 [IU] via SUBCUTANEOUS
  Administered 2020-10-19 – 2020-10-20 (×2): 5 [IU] via SUBCUTANEOUS
  Administered 2020-10-20: 15 [IU] via SUBCUTANEOUS
  Filled 2020-10-17: qty 0.15

## 2020-10-17 MED ORDER — OLANZAPINE 5 MG PO TABS
5.0000 mg | ORAL_TABLET | Freq: Two times a day (BID) | ORAL | Status: DC
  Administered 2020-10-17 – 2020-10-19 (×5): 5 mg via ORAL
  Filled 2020-10-17 (×5): qty 1

## 2020-10-17 MED ORDER — DESMOPRESSIN ACETATE 0.2 MG PO TABS
0.2000 mg | ORAL_TABLET | Freq: Every day | ORAL | Status: DC
  Administered 2020-10-17 – 2020-10-18 (×2): 0.2 mg via ORAL
  Filled 2020-10-17 (×5): qty 1

## 2020-10-17 MED ORDER — INSULIN ASPART 100 UNIT/ML IJ SOLN
0.0000 [IU] | Freq: Every day | INTRAMUSCULAR | Status: DC
  Administered 2020-10-19: 2 [IU] via SUBCUTANEOUS
  Filled 2020-10-17: qty 0.05

## 2020-10-17 MED ORDER — METFORMIN HCL ER 500 MG PO TB24
1000.0000 mg | ORAL_TABLET | Freq: Every day | ORAL | Status: DC
  Administered 2020-10-18 – 2020-10-20 (×3): 1000 mg via ORAL
  Filled 2020-10-17 (×3): qty 2

## 2020-10-17 MED ORDER — BENZTROPINE MESYLATE 1 MG PO TABS
2.0000 mg | ORAL_TABLET | Freq: Two times a day (BID) | ORAL | Status: DC
  Administered 2020-10-17 – 2020-10-19 (×5): 2 mg via ORAL
  Filled 2020-10-17 (×5): qty 2

## 2020-10-17 MED ORDER — DIVALPROEX SODIUM 500 MG PO DR TAB
500.0000 mg | DELAYED_RELEASE_TABLET | Freq: Three times a day (TID) | ORAL | Status: DC
  Administered 2020-10-17 – 2020-10-20 (×8): 500 mg via ORAL
  Filled 2020-10-17 (×8): qty 1

## 2020-10-17 MED ORDER — CLINDAMYCIN HCL 300 MG PO CAPS
300.0000 mg | ORAL_CAPSULE | Freq: Four times a day (QID) | ORAL | Status: DC
  Administered 2020-10-17 – 2020-10-20 (×12): 300 mg via ORAL
  Filled 2020-10-17 (×15): qty 1

## 2020-10-17 NOTE — ED Notes (Signed)
Glucose 519 provider aware

## 2020-10-17 NOTE — ED Triage Notes (Addendum)
Patient here for 3rd time in 24 hours. Patient cursed staff and refused to leave just prior to checking back in. Patient here because no where to go and doesn't like shelter. When he encountered this RN attempted to enter main hospital. Patient with same complaint as other visits. Asked staff for drinks and lunch on arrival

## 2020-10-17 NOTE — ED Notes (Signed)
Pt refused vitals. RN was notified

## 2020-10-17 NOTE — ED Notes (Signed)
Pt d/c home per MD order. Discharge summary reviewed, pt argumentative with nursing staff, provided pt with bag lunch, No s/s of acute distress noted at discharge.

## 2020-10-17 NOTE — BH Assessment (Signed)
Comprehensive Clinical Assessment (CCA) Note  10/17/2020 Dylan Jordan [id number]  DISPOSITION:  Gave clinical report to Dylan Barb, NP, who determined that Pt shall remain in ED, restart meds, stabilize, request [facility] consult, and reassess in AM.  Advised attending PA and RN.    The patient demonstrates the following risk factors for suicide: Chronic risk factors for suicide include: psychiatric disorder of Schizoaffective Disorder. Acute risk factors for suicide include: unemployment and social withdrawal/isolation and homelessness. Protective factors for this patient include: positive therapeutic relationship. Considering these factors, the overall suicide risk at this point appears to be low. Patient is not appropriate for outpatient follow up.  Flowsheet Row ED from 10/17/2020 in [facility] Most recent reading at 10/17/2020  3:35 PM ED from 10/17/2020 in [facility] EMERGENCY DEPARTMENT Most recent reading at 10/17/2020 10:43 AM ED from 10/17/2020 in [facility] EMERGENCY DEPARTMENT Most recent reading at 10/17/2020 12:47 AM  C-SSRS RISK CATEGORY Low Risk No Risk No Risk     Therefore, a telesitter is recommended due to low risk of suicide.  Chief Complaint:  Chief Complaint  Patient presents with  . Hallucinations    Pt endorsed auditory and visual hallucinations, as well as suicidal ideation.  Pt has a dx of Schizoaffective Disorder, Bipolar I   Visit Diagnosis: Schizoaffective Disorder, Bipolar I, Depressed  NARRATIVE:  Pt is a 65 year old male who presented to [facility] on a voluntary basis with complaint of hallucination and suicidal ideation.  Pt is homeless, lives in the [place] area, and receives ACT Team services through [facility].  Pt is known to [facility] and [facility] EDs, and he has made several presentations to the EDs over the last 48 hours.  Pt has a diagnosis of Schizoaffective Disorder, Bipolar Type, and  he also has a history of non-compliance with medication.  Pt appeared agitated and suspicious, and he was a poor historian due to disorganized speech.  Pt reported that he is experiencing auditory and visual hallucination of undetermined nature.  He also endorsed suicidal ideation without plan or intent.  Pt spoke rapidly and was not responsive to questions asked.  Pt stated that he is unemployed and homeless.  Author spoke with Dylan Jordan at [employer] Team [phone]) who stated that the ACTT is following Pt and that Pt has been refusing his injections recently.  During assessment, Pt presented as alert, oriented and agitated.  Pt's demeanor was suspicious and preoccupied with asking author's name.  Pt's mood was preoccupied and irritable.  Pt's affect was agitated and irritable.  Pt's speech was rapid and garbled.  Thought content was loose.  Organization was tangential.  Pt's memory and concentration could not be adequately assessed.  Pt's insight, judgment, and impulse control were poor.  CCA Screening, Triage and Referral (STR)  Patient Reported Information How did you hear about [place]? Self  Referral name: No data recorded Referral phone number: No data recorded  Whom do you see for routine medical problems? I don't have a doctor  Practice/Facility Name: No data recorded Practice/Facility Phone Number: No data recorded Name of Contact: No data recorded Contact Number: No data recorded Contact Fax Number: No data recorded Prescriber Name: No data recorded Prescriber Address (if known): No data recorded  What Is the Reason for Your Visit/Call Today? Hallucinations, suicidal ideation  How Long Has This Been Causing You Problems? 1-6 months  What Do You Feel Would Help You the Most Today? Treatment for  Depression or other mood problem; Medication(s)   Have You Recently Been in Any Inpatient Treatment (Hospital/Detox/Crisis Center/28-Day Program)? No  Name/Location  of Program/Hospital:No data recorded How Long Were You There? No data recorded When Were You Discharged? No data recorded  Have You Ever Received Services From [facility] Before? Yes  [facility]   Have You Recently Had Any Thoughts About Hurting Yourself? Yes  Are You Planning to Commit Suicide/Harm Yourself At This time? No   Have you Recently Had Thoughts About Hurting Someone Dylan Jordan? No  Explanation: No data recorded  Have You Used Any Alcohol or Drugs in the Past 24 Hours? No (UDS was not available at time of assessment)  How Long Ago Did You Use Drugs or Alcohol? No data recorded What Did You Use and How Much? No data recorded  Do You Currently Have a Therapist/Psychiatrist? No  Name of Therapist/Psychiatrist: No data recorded  Have You Been Recently Discharged From Any Office Practice or Programs? No  Explanation of Discharge From Practice/Program: No data recorded    CCA Screening Triage Referral Assessment Type of Contact: Tele-Assessment  Is this Initial or Reassessment? Initial Assessment  Date Telepsych consult ordered in [facility]:  10/17/2020  Time Telepsych consult ordered in [facility]:  1308   Patient Reported Information Reviewed? Yes  Patient Left Without Being Seen? No data recorded Reason for Not Completing Assessment: No data recorded  Collateral Involvement: NA   Does Patient Have a Court Appointed Legal Guardian? No data recorded Name and Contact of Legal Guardian: self  If Minor and Not Living with Parent(s), Who has Custody? n/a  Is CPS involved or ever been involved? Never  Is APS involved or ever been involved? Never   Patient Determined To Be At Risk for Harm To Self or Others Based on Review of Patient Reported Information or Presenting Complaint? No data recorded Method: No data recorded Availability of Means: No data recorded Intent: No data recorded Notification Required: No data recorded Additional  Information for Danger to Others Potential: No data recorded Additional Comments for Danger to Others Potential: No data recorded Are There Guns or Other Weapons in Your Home? No data recorded Types of Guns/Weapons: No data recorded Are These Weapons Safely Secured?                            No data recorded Who Could Verify You Are Able To Have These Secured: No data recorded Do You Have any Outstanding Charges, Pending Court Dates, Parole/Probation? No data recorded Contacted To Inform of Risk of Harm To Self or Others: No data recorded  Location of Assessment: [facility] ED   Does Patient Present under Involuntary Commitment? No  IVC Papers Initial File Date: No data recorded  [place] of Residence: [place]   Patient Currently Receiving the Following Services: ACTT Psychologist, educational)   Determination of Need: Urgent (48 hours)   Options For Referral: Medication Management     CCA Biopsychosocial Intake/Chief Complaint:  Disorganized thinking, hallucination, suicidal ideation without plan  Current Symptoms/Problems: Pt endorsed visual and auditory hallucination; endorsed SI; pt is homeless   Patient Reported Schizophrenia/Schizoaffective Diagnosis in Past: Yes   Strengths: Community support  Preferences: No data recorded Abilities: No data recorded  Type of Services Patient Feels are Needed: Pt stated he wanted medications   Initial Clinical Notes/Concerns: Pt endorsed SI without plan or intent; he endorsed auditory and  visual hallucination; Pt exhibited disorganized thought   Mental Health Symptoms Depression:  None   Duration of Depressive symptoms: No data recorded  Mania:  Increased Energy; Racing thoughts   Anxiety:   None   Psychosis:  Grossly disorganized speech; Hallucinations   Duration of Psychotic symptoms: Greater than six months   Trauma:  N/A   Obsessions:  N/A   Compulsions:  N/A   Inattention:  N/A    Hyperactivity/Impulsivity:  N/A   Oppositional/Defiant Behaviors:  N/A   Emotional Irregularity:  N/A   Other Mood/Personality Symptoms:  No data recorded   Mental Status Exam Appearance and self-care  Stature:  Average   Weight:  Average weight   Clothing:  Casual   Grooming:  Normal   Cosmetic use:  None   Posture/gait:  Normal   Motor activity:  Agitated   Sensorium  Attention:  Normal   Concentration:  Focuses on irrelevancies; Preoccupied   Orientation:  X5   Recall/memory:  Defective in Short-term; Defective in Recent   Affect and Mood  Affect:  Full Range   Mood:  Hypomania; Irritable   Relating  Eye contact:  Fleeting   Facial expression:  No data recorded  Attitude toward examiner:  Argumentative; Suspicious; Irritable   Thought and Language  Speech flow: Articulation error; Flight of Ideas; Garbled; Pressured   Thought content:  Suspicious   Preoccupation:  No data recorded  Hallucinations:  Auditory; Visual   Organization:  No data recorded  [employer] of Knowledge:  Fair   Intelligence:  Average   Abstraction:  Concrete   Judgement:  No data recorded  Reality Testing:  Distorted   Insight:  Poor   Decision Making:  Impulsive   Social Functioning  Social Maturity:  Impulsive   Social Judgement:  Impropriety   Stress  Stressors:  Housing   Coping Ability:  Exhausted   Skill Deficits:  No data recorded  Supports:  Friends/Service system     Religion: Religion/Spirituality Are You A Religious Person?: No  Leisure/Recreation: Leisure / Recreation Do You Have Hobbies?: No  Exercise/Diet: Exercise/Diet Have You Gained or Lost A Significant Amount of Weight in the Past Six Months?: No Do You Follow a Special Diet?: No Do You Have Any Trouble Sleeping?: Yes Explanation of Sleeping Difficulties: Pt endorsed ''no sleep''   CCA Employment/Education Employment/Work Situation: Employment / Work  Psychologist, occupational Employment situation: On disability  Education: Education Is Patient Currently Attending School?: No   CCA Family/Childhood History Family and Relationship History:    Childhood History:     Child/Adolescent Assessment:     CCA Substance Use Alcohol/Drug Use: Alcohol / Drug Use Pain Medications: Please see MAR Prescriptions: Please see MAR Over the Counter: Please see MAR History of alcohol / drug use?: Yes (Per history; Pt could not be adequately assessed due to AMS)                         ASAM's:  Six Dimensions of Multidimensional Assessment  Dimension 1:  Acute Intoxication and/or Withdrawal Potential:      Dimension 2:  Biomedical Conditions and Complications:      Dimension 3:  Emotional, Behavioral, or Cognitive Conditions and Complications:     Dimension 4:  Readiness to Change:     Dimension 5:  Relapse, Continued use, or Continued Problem Potential:     Dimension 6:  Recovery/Living Environment:     ASAM Severity Score:  ASAM Recommended Level of Treatment:     Substance use Disorder (SUD)    Recommendations for Services/Supports/Treatments:    DSM5 Diagnoses: Patient Active Problem List   Diagnosis Date Noted  . Hyperkalemia, diminished renal excretion 03/29/2020  . Hyperkalemia   . Schizoaffective disorder, bipolar type (HCC) 02/21/2019  . Hallucinations   . Anxiety 09/18/2018  . Swelling of both lower extremities 09/13/2018  . Type 2 diabetes mellitus without complication, with long-term current use of insulin (HCC)   . Anemia   . Hyperlipidemia 12/18/2017  . 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 Intermittent chest pain 11/25/2017  . Allergic rhinitis with a nonallergic component 07/30/2017  . Food allergy 07/30/2017  . Chronic gout involving toe of left foot without tophus 05/18/2017  . Hemorrhoids 05/04/2017  . Haloperidol adverse reaction 04/16/2017  . Chronic low back pain 01/03/2017  .  Chronic cough 06/30/2016  . Drug-induced mood disorder (HCC) 06/14/2016  . Type 2 diabetes mellitus with diabetic neuropathy (HCC) 05/26/2016  . Iron deficiency anemia 05/26/2016  . Chronic kidney disease 05/26/2016  . HTN (hypertension) 05/26/2016  . Tobacco abuse 05/26/2016  . History of substance abuse (HCC) 05/26/2016  . History of alcohol abuse 05/26/2016  . Mild persistent asthma 05/26/2016    Patient Centered Plan: Patient is on the following Treatment Plan(s):     Referrals to Alternative Service(s): Referred to Alternative Service(s):   Place:   Date:   Time:    Referred to Alternative Service(s):   Place:   Date:   Time:    Referred to Alternative Service(s):   Place:   Date:   Time:    Referred to Alternative Service(s):   Place:   Date:   Time:     Earline Mayotteugene T Naraya Stoneberg, [facility]

## 2020-10-17 NOTE — ED Provider Notes (Signed)
[facility] EMERGENCY DEPARTMENT Provider Note   CSN: [id number] Arrival date & time: 10/17/20  0005     History Chief Complaint  Patient presents with  . Chest Pain    Dylan Jordan is a 65 y.o. male.  Presents to ER with concern for chest pain.  Patient reports he has been having intermittent chest pain for many days, past couple weeks.  No alleviating or aggravating factors.  Not associated with exertion.  No current chest pain.  Moderate severity.  Also had report of elevated blood sugars and nausea.  Patient denies ongoing nausea, or ongoing vomiting.  HPI     Past Medical History:  Diagnosis Date  . Anemia   . Asthma   . Diabetes mellitus without complication (HCC)   . Gout   . Hyperlipidemia   . Hyperosmolar hyperglycemic coma due to diabetes mellitus without ketoacidosis (HCC)   . Hypertension   . Schizophrenia (HCC) 07/06/2018    Patient Active Problem List   Diagnosis Date Noted  . Hyperkalemia, diminished renal excretion 03/29/2020  . Hyperkalemia   . Schizoaffective disorder, bipolar type (HCC) 02/21/2019  . Hallucinations   . Anxiety 09/18/2018  . Swelling of both lower extremities 09/13/2018  . Type 2 diabetes mellitus without complication, with long-term current use of insulin (HCC)   . Anemia   . Hyperlipidemia 12/18/2017  . 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 Intermittent chest pain 11/25/2017  . Allergic rhinitis with a nonallergic component 07/30/2017  . Food allergy 07/30/2017  . Chronic gout involving toe of left foot without tophus 05/18/2017  . Hemorrhoids 05/04/2017  . Haloperidol adverse reaction 04/16/2017  . Chronic low back pain 01/03/2017  . Chronic cough 06/30/2016  . Drug-induced mood disorder (HCC) 06/14/2016  . Type 2 diabetes mellitus with diabetic neuropathy (HCC) 05/26/2016  . Iron deficiency anemia 05/26/2016  . Chronic kidney disease 05/26/2016  . HTN (hypertension) 05/26/2016  .  Tobacco abuse 05/26/2016  . History of substance abuse (HCC) 05/26/2016  . History of alcohol abuse 05/26/2016  . Mild persistent asthma 05/26/2016    Past Surgical History:  Procedure Laterality Date  . REPLACEMENT TOTAL KNEE Left   . TOE SURGERY         Family History  Adopted: Yes  Problem Relation Age of Onset  . Allergic rhinitis Neg Hx   . Angioedema Neg Hx   . Asthma Neg Hx   . Atopy Neg Hx   . Eczema Neg Hx   . Immunodeficiency Neg Hx   . Urticaria Neg Hx     Social History   Tobacco Use  . Smoking status: Current Some Day Smoker    Packs/day: 0.30    Years: 45.00    Pack years: 13.50    Types: Cigarettes    Start date: 05/16/1971  . Smokeless tobacco: Never Used  . Tobacco comment: Decreased Intake to 10 cigarettes per week or less.  Vaping Use  . Vaping Use: Never used  Substance Use Topics  . Alcohol use: No  . Drug use: No    Home Medications Prior to Admission medications   Medication Sig Start Date End Date Taking? Authorizing Provider  albuterol (PROVENTIL HFA;VENTOLIN HFA) 108 (90 Base) MCG/ACT inhaler Inhale 1-2 puffs into the lungs every 6 (six) hours as needed for wheezing or shortness of breath. 03/25/18   Bobbitt, Heywood Iles, MD  amLODipine (NORVASC) 10 MG tablet Take 10 mg by mouth daily.    [provider]  benztropine (COGENTIN) 2 MG tablet Take 1 tablet (2 mg total) by mouth 2 (two) times daily. 10/07/20   Bobbye Morton, MD  desmopressin (DDAVP) 0.2 MG tablet Take 0.2 mg by mouth at bedtime.    [provider]  divalproex (DEPAKOTE) 500 MG DR tablet Take 1 tablet (500 mg total) by mouth every 8 (eight) hours. 10/07/20   Bobbye Morton, MD  ferrous sulfate 325 (65 FE) MG EC tablet Take 1 tablet (325 mg total) by mouth every other day. 07/14/19   Shirley, [place], DO  Insulin Glargine (LANTUS SOLOSTAR) 100 UNIT/ML Solostar Pen Inject 35 Units into the skin daily. Patient taking differently: Inject 15 Units into the skin at  bedtime.  03/28/19   Dollene Cleveland, DO  insulin lispro (HUMALOG) 100 UNIT/ML injection Inject 0-10 Units into the skin in the morning and at bedtime. Glucose 70-199, 0 units 200-249, 2 units 250-299, 4 units 300-349, 6 units 350-399, 8 units 400-449, 10 units > 450 Call MD 02/18/19   [provider]  melatonin 5 MG TABS Take 5 mg by mouth at bedtime.    [provider]  metformin (FORTAMET) 1000 MG (OSM) 24 hr tablet Take 1 tablet (1,000 mg total) by mouth daily. 10/16/20 11/15/20  Cristina Gong, PA-C  OLANZapine (ZYPREXA) 5 MG tablet Take 1 tablet (5 mg total) by mouth 3 (three) times daily before meals. 10/07/20   Bobbye Morton, MD  omeprazole (PRILOSEC) 20 MG capsule Take 20 mg by mouth 2 (two) times daily before a meal.    [provider]  polyethylene glycol (MIRALAX / GLYCOLAX) 17 g packet Take 17 g by mouth daily as needed. Patient not taking: Reported on 03/31/2020 03/28/19   Dollene Cleveland, DO    Allergies    Chantix [varenicline tartrate], Ativan [lorazepam], Other, Pineapple, Shellfish-derived products, and Penicillins  Review of Systems   Review of Systems  Constitutional: Positive for fatigue. Negative for chills and fever.  HENT: Negative for ear pain and sore throat.   Eyes: Negative for pain and visual disturbance.  Respiratory: Negative for cough and shortness of breath.   Cardiovascular: Positive for chest pain. Negative for palpitations.  Gastrointestinal: Positive for nausea and vomiting. Negative for abdominal pain.  Genitourinary: Negative for dysuria and hematuria.  Musculoskeletal: Negative for arthralgias and back pain.  Skin: Negative for color change and rash.  Neurological: Negative for seizures and syncope.  All other systems reviewed and are negative.   Physical Exam Updated Vital Signs BP 132/89   Pulse 85   Temp 98.7 F (37.1 C) (Oral)   Resp 20   SpO2 96%   Physical Exam Vitals and nursing note  reviewed.  Constitutional:      Appearance: He is well-developed.  HENT:     Head: Normocephalic and atraumatic.  Eyes:     Conjunctiva/sclera: Conjunctivae normal.  Cardiovascular:     Rate and Rhythm: Normal rate and regular rhythm.     Heart sounds: No murmur heard.   Pulmonary:     Effort: Pulmonary effort is normal. No respiratory distress.     Breath sounds: Normal breath sounds.  Abdominal:     Palpations: Abdomen is soft.     Tenderness: There is no abdominal tenderness.  Musculoskeletal:     Cervical back: Neck supple.  Skin:    General: Skin is warm and dry.  Neurological:     General: No focal deficit present.  Mental Status: He is alert.     ED Results / Procedures / Treatments   Labs (all labs ordered are listed, but only abnormal results are displayed) Labs Reviewed  CBC WITH DIFFERENTIAL/PLATELET - Abnormal; Notable for the following components:      Result Value   Hemoglobin 11.5 (*)    HCT 36.9 (*)    All other components within normal limits  COMPREHENSIVE METABOLIC PANEL - Abnormal; Notable for the following components:   Creatinine, Ser 1.57 (*)    GFR, Estimated 49 (*)    All other components within normal limits  I-STAT VENOUS BLOOD GAS, ED - Abnormal; Notable for the following components:   Bicarbonate 30.1 (*)    Acid-Base Excess 4.0 (*)    HCT 36.0 (*)    Hemoglobin 12.2 (*)    All other components within normal limits  LIPASE, BLOOD  BLOOD GAS, VENOUS  URINALYSIS, ROUTINE W REFLEX MICROSCOPIC  TROPONIN I (HIGH SENSITIVITY)  TROPONIN I (HIGH SENSITIVITY)    EKG None  Radiology DG Chest 2 View  Result Date: 10/17/2020 CLINICAL DATA:  Chest pain, dyspnea EXAM: CHEST - 2 VIEW COMPARISON:  10/16/2020 FINDINGS: Lungs are well expanded, symmetric, and clear. No pneumothorax or pleural effusion. Cardiac size within normal limits. Pulmonary vascularity is normal. Osseous structures are age-appropriate. No acute bone abnormality.  IMPRESSION: No active cardiopulmonary disease. Electronically Signed   By: Helyn NumbersAshesh  Parikh MD   On: 10/17/2020 02:06   CT Head Wo Contrast  Result Date: 10/16/2020 CLINICAL DATA:  Mental status changes, slurred speech, unknown etiology EXAM: CT HEAD WITHOUT CONTRAST TECHNIQUE: Contiguous axial images were obtained from the base of the skull through the vertex without intravenous contrast. Sagittal and coronal MPR images reconstructed from axial data set. COMPARISON:  02/20/2019 FINDINGS: Brain: Mild atrophy. Normal ventricular morphology. No midline shift or mass effect. Mild small vessel chronic ischemic changes of deep cerebral white matter. No intracranial hemorrhage, mass lesion, or evidence of acute infarction. No extra-axial fluid collections. Vascular: No hyperdense vessels. Skull: Intact Sinuses/Orbits: Minimal opacification of frontal sinus and RIGHT ethmoid air cells. Other: N/A IMPRESSION: Atrophy with mild small vessel chronic ischemic changes of deep cerebral white matter. No acute intracranial abnormalities. Electronically Signed   By: Ulyses SouthwardMark  Boles M.D.   On: 10/16/2020 18:29   DG Chest Portable 1 View  Result Date: 10/16/2020 CLINICAL DATA:  Cough EXAM: PORTABLE CHEST 1 VIEW COMPARISON:  09/20/2020 FINDINGS: The heart size and mediastinal contours are within normal limits. Both lungs are clear. The visualized skeletal structures are unremarkable. IMPRESSION: No acute abnormality of the lungs in AP portable projection. Electronically Signed   By: Lauralyn PrimesAlex  Bibbey M.D.   On: 10/16/2020 16:58    Procedures Procedures   Medications Ordered in ED Medications  ondansetron (ZOFRAN-ODT) disintegrating tablet 4 mg (4 mg Oral Given 10/17/20 0050)    ED Course  I have reviewed the triage vital signs and the nursing notes.  Pertinent labs & imaging results that were available during my care of the patient were reviewed by me and considered in my medical decision making (see chart for details).     MDM Rules/Calculators/A&P                         65 year old male presents to ER with concern for intermittent episodes of chest pain nausea and vomiting.  On exam patient appears well in no distress.  No ongoing symptoms at present.  Vitals are  stable.  EKG without acute ischemic change and troponin within normal limits, doubt ACS.  CXR negative.  Basic labs stable.  Patient requesting assistance with resources.  Will ask case management to evaluate prior to DC but ultimately believe patient will be stable for discharge this morning.  Final Clinical Impression(s) / ED Diagnoses Final diagnoses:  Chest pain, unspecified type  Homelessness    Rx / DC Orders ED Discharge Orders    None       Milagros Loll, MD 10/17/20 [phone]

## 2020-10-17 NOTE — ED Triage Notes (Signed)
Pt reports Chest pain. High sugar, emesis, bilateral leg pain.  Pain feels better when he walks around.

## 2020-10-17 NOTE — Progress Notes (Signed)
Notified R Sponseller, PA of patient's BS 489.  Use patient's results to inform selection of most appropriate hyperglycemic condition. Lab Results  Component Value Date/Time   pH, Ven 7.385 10/17/2020 0110   CO2 26 10/17/2020 1355   Anion gap 9 10/17/2020 1355   Glucose, Bld 519 (HH) 10/17/2020 1355   Glucose-Capillary 489 (H) 10/17/2020 1625   Osmolality 318 (H) 10/16/2020 1858   Beta-Hydroxybutyric Acid 0.07 10/16/2020 1858        DKA  HHS   pH, Arterial   <7.3  >7.3   CO2 (from BMET)  <18 mmol/L  >18 mmol/L   Anion Gap  >12  n/a   Plasma Glucose  >250 mg/dL  >678 mg/dL   Osmolality  n/a  >938 mOsm/kg   Beta Hydroxybutyric Acid (BHA)  >3 mmol/L*  <3 mmol/L*   Mental Status  Varies c/ severity  Stupor/coma  * BHA value > 62mmol/L is generally diagnostic for DKA.  However, DKA could be present with BHA as low as 1.5 mmol/L if other factors are present.

## 2020-10-17 NOTE — ED Provider Notes (Signed)
Emergency Medicine Provider Triage Evaluation Note  Edword Cu , a 65 y.o. male  was evaluated in triage.  Pt complains of auditory and visual hallucinations as well as SI and HI.  Unsure when this started.  States that he is on a lot of medications.  Wanting 3 tall glasses of water.  Review of Systems  Positive: Hallucinations, SI, HI Negative: Fever  Physical Exam  BP 134/80 (BP Location: Right Arm)   Pulse 84   Temp 98.2 F (36.8 C) (Oral)   Resp 18   SpO2 100%  Gen:   Awake, no distress   Resp:  Normal effort  MSK:   Moves extremities without difficulty  Other:  Patient speech is difficult to understand  Medical Decision Making  Medically screening exam initiated at 1:42 PM.  Appropriate orders placed.  Keionte Swicegood was informed that the remainder of the evaluation will be completed by another provider, this initial triage assessment does not replace that evaluation, and the importance of remaining in the ED until their evaluation is complete.  Ordered medical screening labs and TTS evaluation   Dietrich Pates, PA-C 10/17/20 1344    Lorre Nick, MD 10/17/20 [phone]

## 2020-10-17 NOTE — Progress Notes (Signed)
..   Transition of Care [facility]) - Emergency Department Mini Assessment   Patient Details  Name: Dylan Jordan MRN: [id number] Date of Birth: [birth year]  Transition [facility]) CM/SW Contact:    Kerrick Miler C Tarpley-Carter, LCSWA Phone Number: 10/17/2020, 4:32 PM   Clinical Narrative: TOC [name] spoke with Cordelia Pen [place]int[place] DSS APS in regards to pts mental capacity to care for himself.    Bettylee Feig Tarpley-Carter, MSW, LCSW-A Pronouns:  She, Her, Hers                  [name] Transitions of CareClinical Social Worker [email or web address] [phone]  ED Mini Assessment: What brought you to the Emergency Department? : Hallucinations, SI, HI  Barriers to Discharge: No Barriers Identified     Means of departure: Not know  Interventions which prevented an admission or readmission: Other (must enter comment) (APS Report)    Patient Contact and Communications Key Contact 1: [facility] DSS APS     Contact Date: 10/17/20,     Contact Phone Number: [phone] Call outcome: [name] contacted [place] DSS APS in regards to pts mental capacity to care for himself.  Pt is currently homeless and experiencing hallucinations, SI, HI.      Choice offered to / list presented to : NA  Admission diagnosis:  audio/video hallucinations Patient Active Problem List   Diagnosis Date Noted  . Hyperkalemia, diminished renal excretion 03/29/2020  . Hyperkalemia   . Schizoaffective disorder, bipolar type (HCC) 02/21/2019  . Hallucinations   . Anxiety 09/18/2018  . Swelling of both lower extremities 09/13/2018  . Type 2 diabetes mellitus without complication, with long-term current use of insulin (HCC)   . Anemia   . Hyperlipidemia 12/18/2017  . 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 Intermittent chest pain 11/25/2017  . Allergic rhinitis with a nonallergic component 07/30/2017  . Food allergy 07/30/2017  . Chronic gout involving  toe of left foot without tophus 05/18/2017  . Hemorrhoids 05/04/2017  . Haloperidol adverse reaction 04/16/2017  . Chronic low back pain 01/03/2017  . Chronic cough 06/30/2016  . Drug-induced mood disorder (HCC) 06/14/2016  . Type 2 diabetes mellitus with diabetic neuropathy (HCC) 05/26/2016  . Iron deficiency anemia 05/26/2016  . Chronic kidney disease 05/26/2016  . HTN (hypertension) 05/26/2016  . Tobacco abuse 05/26/2016  . History of substance abuse (HCC) 05/26/2016  . History of alcohol abuse 05/26/2016  . Mild persistent asthma 05/26/2016   PCP:  Pcp, No Pharmacy:   [facility] [place], [place] - [address] [place] [address] Phone: [phone] Fax: [phone]  [facility] [place], [place] - [address] [place] [address] Phone: [phone] Fax: [phone]  [name] [address] [place] [address] Phone: [phone] Fax: [phone]

## 2020-10-17 NOTE — Progress Notes (Signed)
[name] met with patient to review primary care options and homeless resources. [name] introduced self and role to patient and reviewed options and informed him the information will be attached to his AVS. [name] inquired if he had additional questions and noted none at this time. Please reach out to [facility] for additional discharge supports.

## 2020-10-17 NOTE — Discharge Instructions (Addendum)
If you are in need of a shelter please call [facility] ([facility]) at [phone] between the hours of 9am-5pm Mon-Fri.  [facility] will contact all the local shelters to find openings.  Right now they are requiring people to quarantine at a hotel before they can go to an open bed but [facility] may be able to set that up as well.  They are not open on weekends.  On Monday-Friday morning at 8 am until 1 pm, you can also go to the [employer] (see below) to seek shelter in the [facility] prior to entering a shelter. You can also call the number provided (see the above paragraph) to seek placement into the program by calling [facility].   Interactive resource center [facility]) [address] [place], [place] [address] Phone: [phone] Fax: [phone]  For Free Breakfasts and Lunches 7 days a week you can go to: [facility] [address] [place], [place], [place] [address] Hours: Open today  8AM-5PM Phone: [phone] Breakfast: 6:30-7:30 am Lunch served: 10:40 am - 12:40pm         DAY Conservator, museum/gallery [facility]) M-F 8am-3pm   [address] [place], [place] [address]   [phone] Services include: laundry, barbering, support groups, case management, phone  & computer access, showers, AA/NA mtgs, mental health/substance abuse nurse, job skills class, disability information, VA assistance, spiritual classes, etc.   [facility] [address] [place], [place]   [phone] Provides breakfast each weekday morning except Wednesdays, and an evening community meal every Friday. Access to showers is available during breakfast hours and telephones for seeking work are also provided. Also offers job referral and counseling for the homeless and unemployed.  HOMELESS SHELTERS [facility]   [employer] [phone] N[address]     [facility] [address] [place], [place] [address]     [address] [place]  [phone]      [phone]  [facility]   [facility] [address]    [address] [place] [address]     [place], [place] [address] [phone]       [phone]  [facility] (women only) [address] [place], [place] [address] [phone]  [facility]: Marcy Panning (overflow shelter: [address] [place], [place] check in at 6pm for placement in local shelter).  [address] [place], [place], [place] [address] Phone: [phone]  Crisis Services [facility]     [facility]   Crisis Services      [facility] [phone]. [address]     [address] [place], [place] [address]     [place]egino [place], [place] [address]    [facility] Mobile Crisis Management - [phone]  Primary Health Care  For private/commercial insurance please contact the phone number on the back of your insurance card or contact your insurance provider for covered primary health care providers in your area.  For Medicaid covered primary care please consult the physician listed on your Medicaid card. If there is no physician listed, or the physician is no longer in practice, please contact the Department of Social Services in the county your [place] card is issued from for support.    [place]  [employer]  [address] [place],  [place]  [address] Main: [phone] Fax: [phone] Mon-Fri: 8:30AM-5:30PM  [facility] is a patient-centered practice. We provide integrated care in an effort to achieve positive patient experiences and improve patient health care outcomes. Our team is here to support patients in their wellness journeys as well as to provide chronic disease management, lab testing, health education and much more.   [facility]  [address] [place] [place], [place] [address] Phone: [phone] Mon-Fri: 8:30AM-5:30PM   We provide services to children, adolescents, adults and families living with physical, behavioral and mental health challenges.  Our multidisciplinary team delivers evidence based practices to ensure positive outcomes for our patients. We provide an array of Primary Care, Behavioral Health, and Psychiatric services to children, adolescents, adults and families.       [facility] Primary Care at [facility]  [address] [place]aryel Gerald  [place], [place] [address] Main: [phone]  Mon-Fri: 8AM-8PM,  Saturday and Sunday 8AM to 4PM  Located in the [address] shopping plaza off [employer], we provide primary care services for families in [place]. [facility]  [address]  [place], [place] [address] Phone: [phone] Mon-Fri: 8:30AM-5:30PM   [facility] provides primary care services to adults and children 12 years and older in [place].    [facility]  Family Medicine at [facility] [address] [place], [address] Phone: [phone] Mon-Fri: 8:30am - 5:30pm  Pediatrics at [facility] [address] [place], [address] Phone: [phone] Mon-Fri: 8:30am - 5:30pm Saturdays (October - March): 9am - 1pm  Family Medicine at [facility]. [address] [place], [address] Phone: [phone] M, W & F: 8:00am - 5:00pm OUR DOCTORS   [facility] is your medical home. A medical home is a doctor's office where the staff knows you, your children, and your family. Here we know your health history, here you should feel comforted and cared for, knowing you're receiving top-notch care from professionals devoted to your well-being. [facility] offers integrated  behavioral health care to all patients. In addition our advanced care teams are compromised of a Dietician, (LCSW) Licensed Visual merchandiser and a Control and instrumentation engineer.  Our aim is to address mental health and the social drivers by offering education, support and classes for patients through all walks of life.    Outside [facility] [place] [address],  [place], [place] [address] Phone: [phone]   [facility] [place] provides basic healthcare for adults of [place] who cannot afford health insurance and do not qualify for Medicare or Medicaid.  [facility] [address],  [place], [place] [address] Phone: [phone]   We are a non-profit organization that functions on grants, fundraisers and donations. We serve uninsured [place] residents age 60 and over that fall at or below 150% of the federal poverty guidelines.   [facility] [address] [place],  [place], [place] [address] Phone:  [phone]  It is the mission of the [facility] to recognize the right of low income, uninsured citizens of [place] to have access to health care that compassionately meets their basic medical and pharmacy needs. [facility] [place]) [address] [place],  [place], [place] [address] Phone: [phone]  Provide health care services and medicines to uninsured patients who don't qualify for federal or private insurance and have family incomes below 200% of the Federal Poverty Level.

## 2020-10-17 NOTE — ED Provider Notes (Signed)
Emergency Medicine Provider Triage Evaluation Note  Dylan Jordan , a 65 y.o. male  was evaluated in triage.  Pt complains of central chest pain onset several days ago.  He reports CP off and on for several years..  Pt reports associated SOB.  Pt also with vomiting for several hours. Pt also reports chronic back pain.  Records show pt was seen earlier today and treated for hyperglycemia.  No evaluation of chest pain.    Review of Systems  Positive: Chest pain, high sugar, vomiting, SOB Negative: Recent falls, headache,   Physical Exam  BP (!) 157/83 (BP Location: Left Arm)   Pulse 98   Temp 98.7 F (37.1 C) (Oral)   Resp (!) 24   SpO2 96%  Gen:   Awake, no distress, difficult to understand  Resp:  Normal effort  MSK:   Moves extremities without difficulty  Other:  Actively vomiting in the room  Medical Decision Making  Medically screening exam initiated at 12:43 AM.  Appropriate orders placed.  Dylan Jordan was informed that the remainder of the evaluation will be completed by another provider, this initial triage assessment does not replace that evaluation, and the importance of remaining in the ED until their evaluation is complete.  Labs, ECG, CXR and zofran   Dylan Jordan, Dylan Jordan 10/17/20 0048    Zadie Rhine, MD 10/17/20 [phone]

## 2020-10-17 NOTE — Discharge Instructions (Addendum)
Please go to behavioral health urgent care - they can do a psych assessment and modify your psych  medications  Contact stephanie (ACT team)

## 2020-10-17 NOTE — Consult Note (Addendum)
  Dylan Jordan is a 65 year old male who presents to [facility] for evaluation of auditory and visual hallucinations; patient later suicidal and homicidal ideations during his triage. He has a psychiatric history of schizoaffective disorder, bipolar type and presents disorganized, elevated, and tangential; extensive medical history. Patient has presented to ED x4 other times in the past 24 hours for various physical and social complaints. TOC/SW consult placed to address social needs, possible APS report.   Plan:   -TOC/SW consult placed   -Psychotropic medications restarted:    -Benztropine 2 mg BID   -Divalproex DR 500 mg q8hrs   -Olanzapine 5 mg BID   -Medical medications restarted:    -Desmopressin 0.2 mg HS   -Gabapentin 600 mg BID   -Amlodipine 10 mg daily   -Melatonin 5 mg HS   -Omeprazole 20 mg capsule BID before meals   -Metformin 1000 mg 24 hr tab daily   -Ferrous Sulfate 325 mg daily   -Agitation orders placed   -Continuous observation for further stabilization and treatment;   to be reassessed by psychiatry in the morning.

## 2020-10-17 NOTE — TOC Progression Note (Signed)
Transition of Care [facility]) - Progression Note    Patient Details  Name: Dylan Jordan MRN: [id number] Date of Birth: 1955-12-25  [facility]) CM/SW Contact  Lockie Pares, RN Phone Number: 10/17/2020, 4:57 PM  Clinical Narrative:     Spoke with sister  Ms. MCCrae, She has been taking care of patient his whole adult life. She is trying to get guardianship. She lives in [place] and worries about him living on the street and making poor choices, however she cannot take him in, he is often aggressive. She is looking into assisted living for Mr. Likins. Her daughter has taken care of him also but cannot take him in either. She feels he is a danger to himself and others and thinks he needs to be inpatient psych to work out his issues. And be more compliant with medications.     Barriers to Discharge: No Barriers Identified  Expected Discharge Plan and Services                                                 Social Determinants of Health (SDOH) Interventions    Readmission Risk Interventions No flowsheet data found.

## 2020-10-17 NOTE — Care Management (Signed)
PCP and listing of shelters in the area attached to patient instructions.

## 2020-10-17 NOTE — ED Provider Notes (Signed)
[facility] EMERGENCY DEPARTMENT Provider Note   CSN: [id number] Arrival date & time: 10/17/20  1031     History Chief Complaint  Patient presents with  . malingering    Dylan Jordan is a 65 y.o. male presents to the ED for "my mental hallucinations".  States he is seeing cats, dogs, squirrels and they are "annoying".  History of schizophrenia.  States he does not take any medicines for this.  Patient is requesting a diet Coke and apple juice.  Denies SI, HI.  This is patient's fourth ED visit in the last 24 hours.  HPI     Past Medical History:  Diagnosis Date  . Anemia   . Asthma   . Diabetes mellitus without complication (HCC)   . Gout   . Hyperlipidemia   . Hyperosmolar hyperglycemic coma due to diabetes mellitus without ketoacidosis (HCC)   . Hypertension   . Schizophrenia (HCC) 07/06/2018    Patient Active Problem List   Diagnosis Date Noted  . Hyperkalemia, diminished renal excretion 03/29/2020  . Hyperkalemia   . Schizoaffective disorder, bipolar type (HCC) 02/21/2019  . Hallucinations   . Anxiety 09/18/2018  . Swelling of both lower extremities 09/13/2018  . Type 2 diabetes mellitus without complication, with long-term current use of insulin (HCC)   . Anemia   . Hyperlipidemia 12/18/2017  . 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 Intermittent chest pain 11/25/2017  . Allergic rhinitis with a nonallergic component 07/30/2017  . Food allergy 07/30/2017  . Chronic gout involving toe of left foot without tophus 05/18/2017  . Hemorrhoids 05/04/2017  . Haloperidol adverse reaction 04/16/2017  . Chronic low back pain 01/03/2017  . Chronic cough 06/30/2016  . Drug-induced mood disorder (HCC) 06/14/2016  . Type 2 diabetes mellitus with diabetic neuropathy (HCC) 05/26/2016  . Iron deficiency anemia 05/26/2016  . Chronic kidney disease 05/26/2016  . HTN (hypertension) 05/26/2016  . Tobacco abuse 05/26/2016  . History of  substance abuse (HCC) 05/26/2016  . History of alcohol abuse 05/26/2016  . Mild persistent asthma 05/26/2016    Past Surgical History:  Procedure Laterality Date  . REPLACEMENT TOTAL KNEE Left   . TOE SURGERY         Family History  Adopted: Yes  Problem Relation Age of Onset  . Allergic rhinitis Neg Hx   . Angioedema Neg Hx   . Asthma Neg Hx   . Atopy Neg Hx   . Eczema Neg Hx   . Immunodeficiency Neg Hx   . Urticaria Neg Hx     Social History   Tobacco Use  . Smoking status: Current Some Day Smoker    Packs/day: 0.30    Years: 45.00    Pack years: 13.50    Types: Cigarettes    Start date: 05/16/1971  . Smokeless tobacco: Never Used  . Tobacco comment: Decreased Intake to 10 cigarettes per week or less.  Vaping Use  . Vaping Use: Never used  Substance Use Topics  . Alcohol use: No  . Drug use: No    Home Medications Prior to Admission medications   Medication Sig Start Date End Date Taking? Authorizing Provider  albuterol (PROVENTIL HFA;VENTOLIN HFA) 108 (90 Base) MCG/ACT inhaler Inhale 1-2 puffs into the lungs every 6 (six) hours as needed for wheezing or shortness of breath. 03/25/18   Bobbitt, Heywood Iles, MD  amLODipine (NORVASC) 10 MG tablet Take 10 mg by mouth daily.    [provider]  benztropine (COGENTIN) 2 MG tablet Take 1 tablet (2 mg total) by mouth 2 (two) times daily. 10/07/20   Bobbye MortonMcQuilla, Jai B, MD  desmopressin (DDAVP) 0.2 MG tablet Take 0.2 mg by mouth at bedtime.    [provider]  divalproex (DEPAKOTE) 500 MG DR tablet Take 1 tablet (500 mg total) by mouth every 8 (eight) hours. 10/07/20   Bobbye MortonMcQuilla, Jai B, MD  ferrous sulfate 325 (65 FE) MG EC tablet Take 1 tablet (325 mg total) by mouth every other day. 07/14/19   Shirley, [place], DO  Insulin Glargine (LANTUS SOLOSTAR) 100 UNIT/ML Solostar Pen Inject 35 Units into the skin daily. Patient taking differently: Inject 15 Units into the skin at bedtime.  03/28/19   Dollene ClevelandAnderson, Hannah C,  DO  insulin lispro (HUMALOG) 100 UNIT/ML injection Inject 0-10 Units into the skin in the morning and at bedtime. Glucose 70-199, 0 units 200-249, 2 units 250-299, 4 units 300-349, 6 units 350-399, 8 units 400-449, 10 units > 450 Call MD 02/18/19   [provider]  melatonin 5 MG TABS Take 5 mg by mouth at bedtime.    [provider]  metformin (FORTAMET) 1000 MG (OSM) 24 hr tablet Take 1 tablet (1,000 mg total) by mouth daily. 10/16/20 11/15/20  Cristina GongHammond, Elizabeth W, PA-C  OLANZapine (ZYPREXA) 5 MG tablet Take 1 tablet (5 mg total) by mouth 3 (three) times daily before meals. 10/07/20   Bobbye MortonMcQuilla, Jai B, MD  omeprazole (PRILOSEC) 20 MG capsule Take 20 mg by mouth 2 (two) times daily before a meal.    [provider]  polyethylene glycol (MIRALAX / GLYCOLAX) 17 g packet Take 17 g by mouth daily as needed. Patient not taking: Reported on 03/31/2020 03/28/19   Dollene ClevelandAnderson, Hannah C, DO    Allergies    Chantix [varenicline tartrate], Ativan [lorazepam], Other, Pineapple, Shellfish-derived products, and Penicillins  Review of Systems   Review of Systems  Psychiatric/Behavioral: Positive for hallucinations.  All other systems reviewed and are negative.   Physical Exam Updated Vital Signs BP (!) 159/103   Pulse (!) 107   Temp 98.6 F (37 C) (Oral)   Resp 14   SpO2 99%   Physical Exam Vitals and nursing note reviewed.  Constitutional:      General: He is not in acute distress.    Appearance: He is well-developed.     Comments: NAD.  HENT:     Head: Normocephalic and atraumatic.     Right Ear: External ear normal.     Left Ear: External ear normal.     Nose: Nose normal.  Eyes:     General: No scleral icterus.    Conjunctiva/sclera: Conjunctivae normal.  Cardiovascular:     Rate and Rhythm: Normal rate and regular rhythm.     Heart sounds: Normal heart sounds.  Pulmonary:     Effort: Pulmonary effort is normal.     Breath sounds: Normal breath sounds.   Musculoskeletal:        General: No deformity. Normal range of motion.     Cervical back: Normal range of motion and neck supple.  Skin:    General: Skin is warm and dry.     Capillary Refill: Capillary refill takes less than 2 seconds.  Neurological:     Mental Status: He is alert and oriented to person, place, and time.  Psychiatric:     Comments: Good eye contact.  Speech is slightly pressured.  Appears to have normal behavior.  Ambulatory with  his cane.  Denies SI, HI.  Endorses visual hallucinations including seeing cats, dogs, squirrels.     ED Results / Procedures / Treatments   Labs (all labs ordered are listed, but only abnormal results are displayed) Labs Reviewed - No data to display  EKG None  Radiology DG Chest 2 View  Result Date: 10/17/2020 CLINICAL DATA:  Chest pain, dyspnea EXAM: CHEST - 2 VIEW COMPARISON:  10/16/2020 FINDINGS: Lungs are well expanded, symmetric, and clear. No pneumothorax or pleural effusion. Cardiac size within normal limits. Pulmonary vascularity is normal. Osseous structures are age-appropriate. No acute bone abnormality. IMPRESSION: No active cardiopulmonary disease. Electronically Signed   By: Helyn Numbers MD   On: 10/17/2020 02:06   CT Head Wo Contrast  Result Date: 10/16/2020 CLINICAL DATA:  Mental status changes, slurred speech, unknown etiology EXAM: CT HEAD WITHOUT CONTRAST TECHNIQUE: Contiguous axial images were obtained from the base of the skull through the vertex without intravenous contrast. Sagittal and coronal MPR images reconstructed from axial data set. COMPARISON:  02/20/2019 FINDINGS: Brain: Mild atrophy. Normal ventricular morphology. No midline shift or mass effect. Mild small vessel chronic ischemic changes of deep cerebral white matter. No intracranial hemorrhage, mass lesion, or evidence of acute infarction. No extra-axial fluid collections. Vascular: No hyperdense vessels. Skull: Intact Sinuses/Orbits: Minimal opacification  of frontal sinus and RIGHT ethmoid air cells. Other: N/A IMPRESSION: Atrophy with mild small vessel chronic ischemic changes of deep cerebral white matter. No acute intracranial abnormalities. Electronically Signed   By: Ulyses Southward M.D.   On: 10/16/2020 18:29   DG Chest Portable 1 View  Result Date: 10/16/2020 CLINICAL DATA:  Cough EXAM: PORTABLE CHEST 1 VIEW COMPARISON:  09/20/2020 FINDINGS: The heart size and mediastinal contours are within normal limits. Both lungs are clear. The visualized skeletal structures are unremarkable. IMPRESSION: No acute abnormality of the lungs in AP portable projection. Electronically Signed   By: Lauralyn Primes M.D.   On: 10/16/2020 16:58    Procedures Procedures   Medications Ordered in ED Medications - No data to display  ED Course  I have reviewed the triage vital signs and the nursing notes.  Pertinent labs & imaging results that were available during my care of the patient were reviewed by me and considered in my medical decision making (see chart for details).    MDM Rules/Calculators/A&P                          65 year old male with psychiatric history presents to the ED for visual hallucinations.  States he does not take any psychiatric medicines.  Denies SI, HI.  I have reviewed patient's chart.  This is his fourth ED visit.  He was last seen by psych NP on 5/13 for "mental hygiene problems".  He had pressured speech at that time.  He was refusing psych medications and injections.  He did not meet inpatient criteria at that time and he was discharged with act team follow-up.  I suspect patient is having worsening of his chronic hallucinations.  He has had hallucinations well-documented on his chart.  I do not think he is responding to internal stimuli.  He denies SI and HI.  I do not think emergent lab work or imaging is necessary today.  I think he can be discharged and referred to behavioral health or urgent care. Final Clinical Impression(s) / ED  Diagnoses Final diagnoses:  Continuous auditory hallucinations    Rx / DC Orders  ED Discharge Orders    None       Jerrell Mylar 10/17/20 1108    Derwood Kaplan, MD 10/18/20 [id number]

## 2020-10-17 NOTE — ED Provider Notes (Signed)
[facility]-EMERGENCY DEPT Provider Note   CSN: [id number] Arrival date & time: 10/17/20  1315     History Chief Complaint  Patient presents with  . Hallucinations    Pt endorsed auditory and visual hallucinations, as well as suicidal ideation.  Pt has a dx of Schizoaffective Disorder, Bipolar I    Dylan Jordan is a 65 y.o. male with extensive psychiatric history who presents to the emergency department for evaluation of his auditory and visual hallucinations, he also endorses suicidal ideation and homicidal ideation to provider in triage, however denies those thoughts at this time.  Patient states he supposed be multiple medications, however he is not currently taking any.  Of note, history is provided by the patient however he speaks very unclearly and is difficult to understand.  Level 5 caveat due to underlying psychiatric illness. He does state that his knees are hurting secondary to chronic pain, and reiterates that he is seeing and hearing things that he knows that this provider cannot, however that he knows to be real. Additionally he states he has a broken tooth that he thinks it is becoming infected due to the pain he is experiencing in his mouth, denies any difficulty swallowing , or sore throat.  Of note, this is the patient's fifth emergency department evaluation in the last 24 hours.  He has been found to be hyperglycemic at all them and was most recently treated for hyperglycemia at his last ED visit; he has not been found to be in DKA at any of these visits.  States he supposed be on insulin but has not taken it for a long time.  HPI     Past Medical History:  Diagnosis Date  . Anemia   . Asthma   . Diabetes mellitus without complication (HCC)   . Gout   . Hyperlipidemia   . Hyperosmolar hyperglycemic coma due to diabetes mellitus without ketoacidosis (HCC)   . Hypertension   . Schizophrenia (HCC) 07/06/2018    Patient Active Problem List    Diagnosis Date Noted  . Hyperkalemia, diminished renal excretion 03/29/2020  . Hyperkalemia   . Schizoaffective disorder, bipolar type (HCC) 02/21/2019  . Hallucinations   . Anxiety 09/18/2018  . Swelling of both lower extremities 09/13/2018  . Type 2 diabetes mellitus without complication, with long-term current use of insulin (HCC)   . Anemia   . Hyperlipidemia 12/18/2017  . 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 Intermittent chest pain 11/25/2017  . Allergic rhinitis with a nonallergic component 07/30/2017  . Food allergy 07/30/2017  . Chronic gout involving toe of left foot without tophus 05/18/2017  . Hemorrhoids 05/04/2017  . Haloperidol adverse reaction 04/16/2017  . Chronic low back pain 01/03/2017  . Chronic cough 06/30/2016  . Drug-induced mood disorder (HCC) 06/14/2016  . Type 2 diabetes mellitus with diabetic neuropathy (HCC) 05/26/2016  . Iron deficiency anemia 05/26/2016  . Chronic kidney disease 05/26/2016  . HTN (hypertension) 05/26/2016  . Tobacco abuse 05/26/2016  . History of substance abuse (HCC) 05/26/2016  . History of alcohol abuse 05/26/2016  . Mild persistent asthma 05/26/2016    Past Surgical History:  Procedure Laterality Date  . REPLACEMENT TOTAL KNEE Left   . TOE SURGERY         Family History  Adopted: Yes  Problem Relation Age of Onset  . Allergic rhinitis Neg Hx   . Angioedema Neg Hx   . Asthma Neg Hx   . Atopy Neg  Hx   . Eczema Neg Hx   . Immunodeficiency Neg Hx   . Urticaria Neg Hx     Social History   Tobacco Use  . Smoking status: Current Some Day Smoker    Packs/day: 0.30    Years: 45.00    Pack years: 13.50    Types: Cigarettes    Start date: 05/16/1971  . Smokeless tobacco: Never Used  . Tobacco comment: Decreased Intake to 10 cigarettes per week or less.  Vaping Use  . Vaping Use: Never used  Substance Use Topics  . Alcohol use: No  . Drug use: No    Home Medications Prior to Admission  medications   Medication Sig Start Date End Date Taking? Authorizing Provider  albuterol (PROVENTIL HFA;VENTOLIN HFA) 108 (90 Base) MCG/ACT inhaler Inhale 1-2 puffs into the lungs every 6 (six) hours as needed for wheezing or shortness of breath. 03/25/18   Bobbitt, Heywood Iles, MD  amLODipine (NORVASC) 10 MG tablet Take 10 mg by mouth daily.    [provider]  benztropine (COGENTIN) 2 MG tablet Take 1 tablet (2 mg total) by mouth 2 (two) times daily. 10/07/20   Bobbye Morton, MD  desmopressin (DDAVP) 0.2 MG tablet Take 0.2 mg by mouth at bedtime.    [provider]  divalproex (DEPAKOTE) 500 MG DR tablet Take 1 tablet (500 mg total) by mouth every 8 (eight) hours. 10/07/20   Bobbye Morton, MD  ferrous sulfate 325 (65 FE) MG EC tablet Take 1 tablet (325 mg total) by mouth every other day. 07/14/19   Shirley, [place], DO  Insulin Glargine (LANTUS SOLOSTAR) 100 UNIT/ML Solostar Pen Inject 35 Units into the skin daily. Patient taking differently: Inject 15 Units into the skin at bedtime.  03/28/19   Dollene Cleveland, DO  insulin lispro (HUMALOG) 100 UNIT/ML injection Inject 0-10 Units into the skin in the morning and at bedtime. Glucose 70-199, 0 units 200-249, 2 units 250-299, 4 units 300-349, 6 units 350-399, 8 units 400-449, 10 units > 450 Call MD 02/18/19   [provider]  melatonin 5 MG TABS Take 5 mg by mouth at bedtime.    [provider]  metformin (FORTAMET) 1000 MG (OSM) 24 hr tablet Take 1 tablet (1,000 mg total) by mouth daily. 10/16/20 11/15/20  Cristina Gong, PA-C  OLANZapine (ZYPREXA) 5 MG tablet Take 1 tablet (5 mg total) by mouth 3 (three) times daily before meals. 10/07/20   Bobbye Morton, MD  omeprazole (PRILOSEC) 20 MG capsule Take 20 mg by mouth 2 (two) times daily before a meal.    [provider]  polyethylene glycol (MIRALAX / GLYCOLAX) 17 g packet Take 17 g by mouth daily as needed. Patient not taking: Reported on  03/31/2020 03/28/19   Dollene Cleveland, DO    Allergies    Chantix [varenicline tartrate], Ativan [lorazepam], Other, Pineapple, Shellfish-derived products, and Penicillins  Review of Systems   Review of Systems  Constitutional: Negative.   HENT: Positive for dental problem. Negative for congestion, sneezing, sore throat, tinnitus, trouble swallowing and voice change.   Respiratory: Negative.   Cardiovascular: Negative.   Musculoskeletal: Positive for arthralgias.  Psychiatric/Behavioral: Positive for hallucinations.    Physical Exam Updated Vital Signs BP 126/79 (BP Location: Left Arm)   Pulse 80   Temp 98.2 F (36.8 C) (Oral)   Resp 18   SpO2 99%   Physical Exam Vitals and nursing note reviewed.  Constitutional:  Appearance: He is overweight. He is not ill-appearing or toxic-appearing.  HENT:     Head: Normocephalic and atraumatic.     Nose: Nose normal.     Mouth/Throat:     Mouth: Mucous membranes are moist.     Dentition: Abnormal dentition. Dental tenderness, gingival swelling and dental caries present.     Pharynx: Oropharynx is clear. Uvula midline. No oropharyngeal exudate or posterior oropharyngeal erythema.     Tonsils: No tonsillar exudate.      Comments: No sublingual or submental tenderness to palpation, no sign of oropharyngeal abscess Eyes:     General: Lids are normal. Vision grossly intact.        Right eye: No discharge.        Left eye: No discharge.     Extraocular Movements: Extraocular movements intact.     Conjunctiva/sclera: Conjunctivae normal.     Pupils: Pupils are equal, round, and reactive to light.  Neck:     Trachea: Trachea and phonation normal.  Cardiovascular:     Rate and Rhythm: Normal rate and regular rhythm.     Pulses: Normal pulses.     Heart sounds: Normal heart sounds. No murmur heard.   Pulmonary:     Effort: Pulmonary effort is normal. No tachypnea, bradypnea, accessory muscle usage or respiratory distress.      Breath sounds: Normal breath sounds. No wheezing or rales.  Chest:     Chest wall: No mass, lacerations, deformity, swelling, tenderness, crepitus or edema.  Abdominal:     General: Bowel sounds are normal. There is no distension.     Palpations: Abdomen is soft.     Tenderness: There is no abdominal tenderness. There is no right CVA tenderness, left CVA tenderness, guarding or rebound.  Musculoskeletal:        General: No deformity.     Cervical back: Normal range of motion and neck supple. No edema, rigidity or crepitus. No pain with movement or spinous process tenderness.     Right lower leg: No edema.     Left lower leg: No edema.  Lymphadenopathy:     Cervical: No cervical adenopathy.  Skin:    General: Skin is warm and dry.     Capillary Refill: Capillary refill takes less than 2 seconds.     Findings: No rash.  Neurological:     Mental Status: He is alert. Mental status is at baseline.     Cranial Nerves: Cranial nerves are intact.     Sensory: Sensation is intact.     Motor: Motor function is intact.     Gait: Gait is intact.  Psychiatric:        Attention and Perception: He perceives auditory and visual hallucinations.        Mood and Affect: Affect is labile.        Speech: Speech is slurred and tangential.        Thought Content: Thought content does not include homicidal or suicidal ideation. Thought content does not include homicidal or suicidal plan.     Comments: Patient does appear to be responding to internal stimuli at this time, rating feverishly in the inside comfort of his book, states that he knows that "you cannot see or hear things that I see but I know that they are there".     ED Results / Procedures / Treatments   Labs (all labs ordered are listed, but only abnormal results are displayed) Labs Reviewed  COMPREHENSIVE METABOLIC PANEL -  Abnormal; Notable for the following components:      Result Value   Sodium 134 (*)    Potassium 5.4 (*)    Glucose,  Bld 519 (*)    Creatinine, Ser 1.51 (*)    AST 14 (*)    GFR, Estimated 51 (*)    All other components within normal limits  CBC WITH DIFFERENTIAL/PLATELET - Abnormal; Notable for the following components:   Hemoglobin 11.9 (*)    HCT 38.0 (*)    All other components within normal limits  ACETAMINOPHEN LEVEL - Abnormal; Notable for the following components:   Acetaminophen (Tylenol), Serum <10 (*)    All other components within normal limits  SALICYLATE LEVEL - Abnormal; Notable for the following components:   Salicylate Lvl <7.0 (*)    All other components within normal limits  CBG MONITORING, ED - Abnormal; Notable for the following components:   Glucose-Capillary 489 (*)    All other components within normal limits  ETHANOL  RAPID URINE DRUG SCREEN, HOSP PERFORMED  HEMOGLOBIN A1C  CBG MONITORING, ED    EKG EKG Interpretation  Date/Time:  Sunday October 17 2020 14:31:53 EDT Ventricular Rate:  87 PR Interval:  181 QRS Duration: 90 QT Interval:  368 QTC Calculation: 443 R Axis:   -3 Text Interpretation: Sinus rhythm Low voltage, precordial leads Abnormal R-wave progression, early transition Probable anteroseptal infarct, old Confirmed by Lorre Nick ([id number]) on 10/17/2020 4:42:47 PM   Radiology DG Chest 2 View  Result Date: 10/17/2020 CLINICAL DATA:  Chest pain, dyspnea EXAM: CHEST - 2 VIEW COMPARISON:  10/16/2020 FINDINGS: Lungs are well expanded, symmetric, and clear. No pneumothorax or pleural effusion. Cardiac size within normal limits. Pulmonary vascularity is normal. Osseous structures are age-appropriate. No acute bone abnormality. IMPRESSION: No active cardiopulmonary disease. Electronically Signed   By: Helyn Numbers MD   On: 10/17/2020 02:06   CT Head Wo Contrast  Result Date: 10/16/2020 CLINICAL DATA:  Mental status changes, slurred speech, unknown etiology EXAM: CT HEAD WITHOUT CONTRAST TECHNIQUE: Contiguous axial images were obtained from the base of the skull  through the vertex without intravenous contrast. Sagittal and coronal MPR images reconstructed from axial data set. COMPARISON:  02/20/2019 FINDINGS: Brain: Mild atrophy. Normal ventricular morphology. No midline shift or mass effect. Mild small vessel chronic ischemic changes of deep cerebral white matter. No intracranial hemorrhage, mass lesion, or evidence of acute infarction. No extra-axial fluid collections. Vascular: No hyperdense vessels. Skull: Intact Sinuses/Orbits: Minimal opacification of frontal sinus and RIGHT ethmoid air cells. Other: N/A IMPRESSION: Atrophy with mild small vessel chronic ischemic changes of deep cerebral white matter. No acute intracranial abnormalities. Electronically Signed   By: Ulyses Southward M.D.   On: 10/16/2020 18:29   DG Chest Portable 1 View  Result Date: 10/16/2020 CLINICAL DATA:  Cough EXAM: PORTABLE CHEST 1 VIEW COMPARISON:  09/20/2020 FINDINGS: The heart size and mediastinal contours are within normal limits. Both lungs are clear. The visualized skeletal structures are unremarkable. IMPRESSION: No acute abnormality of the lungs in AP portable projection. Electronically Signed   By: Lauralyn Primes M.D.   On: 10/16/2020 16:58    Procedures Procedures   Medications Ordered in ED Medications  OLANZapine (ZYPREXA) tablet 5 mg (5 mg Oral Given 10/17/20 1534)  divalproex (DEPAKOTE) DR tablet 500 mg (500 mg Oral Given 10/17/20 1534)  benztropine (COGENTIN) tablet 2 mg (2 mg Oral Given 10/17/20 1534)  clindamycin (CLEOCIN) capsule 300 mg (300 mg Oral Given 10/17/20 1710)  insulin  aspart (novoLOG) injection 0-15 Units (15 Units Subcutaneous Given 10/17/20 1707)  insulin aspart (novoLOG) injection 0-5 Units (has no administration in time range)  insulin aspart (novoLOG) injection 10 Units (10 Units Subcutaneous Given 10/17/20 1706)    ED Course  I have reviewed the triage vital signs and the nursing notes.  Pertinent labs & imaging results that were available during my care of  the patient were reviewed by me and considered in my medical decision making (see chart for details).  Clinical Course as of 10/17/20 1744  Wynelle Link Oct 17, 2020  1622 Personally spoke with the patient's sister, Marshell Levan, who states that she is attempting to gain guardianship over this patient.  Additionally she would like for him to be placed in a facility near [place] where she is located, so that she may be more directly involved in his care.  I appreciate her insight into the care of this patient. [RS]    Clinical Course User Index [RS] Amna Welker, Idelia Salm   MDM Rules/Calculators/A&P                         65 year old male who presents with concern for AVH.  Extensive history of psychiatric illness, noncompliant with medications.  Vitals are normal intake.  Cardiopulmonary exam is normal, abdominal exam is benign.  Patient is well-appearing, patient did undergo multiple laboratory studies at his previous ED visit this afternoon, approximately 2 hours prior to my initial exam, no need to repeat laboratory studies, however repeat CBG did reveal glucose over 500.  Will administer 10 units of subcu insulin at this time, sliding scale insulin and a diabetic or any other orders have been placed. Patient was evaluated by psych nitro prior to my initial evaluation.  The recommendations for the patient to remain in the emergency department overnight so that his medications can be restarted and hopefully behavioral health can obtain placement for him in a secure facility.  Personally spoke with the patient sister on the phone as above; her chronic information has been added to the patient's chart.  No further work-up warranted at this time will, will continue to monitor CBG.  This chart was dictated using voice recognition software, Dragon. Despite the best efforts of this provider to proofread and correct errors, errors may still occur which can change documentation meaning.  Final Clinical  Impression(s) / ED Diagnoses Final diagnoses:  None    Rx / DC Orders ED Discharge Orders    None       Sherrilee Gilles 10/17/20 1744    Lorre Nick, MD 10/25/20 1445

## 2020-10-17 NOTE — ED Triage Notes (Signed)
Patient reports he is coming from [facility] c/o visual and auditory hallucinations. He reports he is seeing "small people" and hearing voices, but cannot verbalize what the voices are saying. He verbalizes SI and HI. He states he does not know how he plans to hurt himself or anyone else. He asks for food and drink during triage.

## 2020-10-17 NOTE — ED Notes (Signed)
Pt changed into purple scrubs and belongings put in pt belongings cabinet

## 2020-10-17 NOTE — ED Notes (Addendum)
Sister Trude Mcburney  [phone] called and wants placement for him. She is not on the list of contacts but states he called her and told her he was here.

## 2020-10-18 LAB — I-STAT BETA HCG BLOOD, ED (MC, WL, AP ONLY): I-stat hCG, quantitative: <5 m[IU]/mL (ref <5)

## 2020-10-18 LAB — BASIC METABOLIC PANEL
Anion gap: 5 (ref 5–15)
BUN: 21 mg/dL (ref 8–23)
CO2: 29 mmol/L (ref 22–32)
Calcium: 9.1 mg/dL (ref 8.9–10.3)
Chloride: 102 mmol/L (ref 98–111)
Creatinine, Ser: 1.5 mg/dL — ABNORMAL HIGH (ref 0.61–1.24)
GFR, Estimated: 52 mL/min — ABNORMAL LOW (ref >60)
Glucose, Bld: 242 mg/dL — ABNORMAL HIGH (ref 70–99)
Potassium: 4.4 mmol/L (ref 3.5–5.1)
Sodium: 136 mmol/L (ref 135–145)

## 2020-10-18 LAB — CBG MONITORING, ED
Glucose-Capillary: 239 mg/dL — ABNORMAL HIGH (ref 70–99)
Glucose-Capillary: 341 mg/dL — ABNORMAL HIGH (ref 70–99)
Glucose-Capillary: 242 mg/dL — ABNORMAL HIGH (ref 70–99)

## 2020-10-18 MED ORDER — BENZONATATE 100 MG PO CAPS
100.0000 mg | ORAL_CAPSULE | Freq: Once | ORAL | Status: DC
  Filled 2020-10-18: qty 1

## 2020-10-18 MED ORDER — ACETAMINOPHEN 325 MG PO TABS
650.0000 mg | ORAL_TABLET | Freq: Four times a day (QID) | ORAL | Status: DC | PRN
  Administered 2020-10-19: 650 mg via ORAL
  Filled 2020-10-18: qty 2

## 2020-10-18 MED ORDER — IBUPROFEN 800 MG PO TABS
800.0000 mg | ORAL_TABLET | Freq: Once | ORAL | Status: AC
  Administered 2020-10-18: 800 mg via ORAL
  Filled 2020-10-18: qty 1

## 2020-10-18 NOTE — ED Notes (Signed)
Patient irritated with the noise made by other patient on the unit.  Patient attempted to confront other patient.  Staff successfully deescalated patient and escorted him back to room.

## 2020-10-18 NOTE — ED Notes (Signed)
Pt alert this shift. Confused. Slurred speech.  Uncooperative at times. Irritable. Medication compliant.  Pt able to complete ADLs. Pt able to ambulate

## 2020-10-18 NOTE — Consult Note (Addendum)
[facility]   Reason for Consult:  Psychiatric re evaluation Referring Physician:  Dr. Deretha EmoryZackowski, EDP Patient Identification: Dylan Jordan MRN:  [id number] Principal Diagnosis: Schizoaffective disorder, bipolar type (HCC) Diagnosis:  Principal Problem:   Schizoaffective disorder, bipolar type (HCC) Active Problems:   Type 2 diabetes mellitus without complication, with long-term current use of insulin (HCC)   Total Time spent with patient: 30 minutes  Subjective:   Dylan Jordan is a 65 y.o. male patient admitted with with complaints of hallucinations endorses auditory and visual, as well as suicidal ideation.  History of schizoaffective disorder and bipolar 1.  Has been noncompliant with his medications, blood sugars have been uncontrolled. In 400 range today.   HPI: Seen today face-to-face, "I came to the hospital volunteered "I want to get mental health care" .  Patient lethargic, irritable, with a labile affect throughout interview, easily aroused.  Became irritable with questioning.  Speech slurred, volume soft, mumbling about "money "denies auditory and visual hallucinations, denies suicidal ideation no intent no plan denies homicidal ideations.  Denies substance abuse and alcohol use.  Reports that he does not have a therapist, has not been sleeping for days and not eating.  Reports "no support systems "denies taking current medications.  EDP note earlier today, endorses auditory and visual hallucinations as well as suicidal ideation.  Blood sugars remain in the 400s range. [facility]   During evaluation Dylan Jordan is lying in bed;he is oriented to person, place, and year; irritable to questions; and mood congruent with affect.  Patient is speaking  with slurred speech and  loud volume, and normal pace; with very little  eye contact. His thought process is incoherent and mumbles about "money" There is no  indication that he is currently responding to internal/external stimuli or experiencing delusional thought content.  Patient denies suicidal/self-harm/homicidal ideation without intent or plan, psychosis, and paranoia, according to EDP note earlier today, patient presented with auditory and visual hallucinations as well as suicidal ideation. Not compliant with medications.   Patient has remained irritable throughout interview with times of appearing to be asleep then speaking loudly to answer providers questions.   Collateral: "no support systems"   Recommendation:   Disposition:   Recommend psychiatric Inpatient admission when medically cleared. Blood sugars in 400 range today.        Past Psychiatric History: Schizoaffective disorder, bipolar 1  Risk to Self:  Yes Risk to Others:  Yes Prior Inpatient Therapy:  Unknown  prior Outpatient Therapy:  Unknown Past Medical History:  Past Medical History:  Diagnosis Date  . Anemia   . Asthma   . Diabetes mellitus without complication (HCC)   . Gout   . Hyperlipidemia   . Hyperosmolar hyperglycemic coma due to diabetes mellitus without ketoacidosis (HCC)   . Hypertension   . Schizophrenia (HCC) 07/06/2018    Past Surgical History:  Procedure Laterality Date  . REPLACEMENT TOTAL KNEE Left   . TOE SURGERY     Family History:  Family History  Adopted: Yes  Problem Relation Age of Onset  . Allergic rhinitis Neg Hx   . Angioedema Neg Hx   . Asthma Neg Hx   . Atopy Neg Hx   . Eczema Neg Hx   . Immunodeficiency Neg Hx   . Urticaria Neg Hx    Family Psychiatric  History: unknown Social History:  Social History   Substance and Sexual Activity  Alcohol Use No     Social History   Substance and Sexual Activity  Drug Use No    Social History   Socioeconomic History  . Marital status: Single    Spouse name: Not on file  . Number of children: Not on file  . Years of education: Not on file  . Highest education level: Not  on file  Occupational History  . Not on file  Tobacco Use  . Smoking status: Current Some Day Smoker    Packs/day: 0.30    Years: 45.00    Pack years: 13.50    Types: Cigarettes    Start date: 05/16/1971  . Smokeless tobacco: Never Used  . Tobacco comment: Decreased Intake to 10 cigarettes per week or less.  Vaping Use  . Vaping Use: Never used  Substance and Sexual Activity  . Alcohol use: No  . Drug use: No  . Sexual activity: Not on file  Other Topics Concern  . Not on file  Social History Narrative   Lives by himself at home. Closest family member is niece.       ETOH none   Drugs non e   Current smoker, none for three days    Social Determinants of Health   Financial Resource Strain: Not on file  Food Insecurity: Not on file  Transportation Needs: Not on file  Physical Activity: Not on file  Stress: Not on file  Social Connections: Not on file   Additional Social History:    Allergies:   Allergies  Allergen Reactions  . Chantix [Varenicline Tartrate] Other (See Comments)    Nightmares  . Ativan [Lorazepam] Other (See Comments)    Agitation (and "5 others like it") Will need to call [facility] [place] (phone) [phone] and [phone] to fax a release-   . Other Other (See Comments)    Chinese Rubber Tree Stem Bark- sneezing  . Pineapple Other (See Comments)    Pt states "all my allergies make me feel eerie." " It does not make me feel absolute."   . Penicillins Diarrhea    Labs:  Results for orders placed or performed during the hospital encounter of 10/17/20 (from the past 48 hour(s))  Urine rapid drug screen (hosp performed)     Status: None   Collection Time: 10/17/20  1:43 PM  Result Value Ref Range   Opiates NONE DETECTED NONE DETECTED   Cocaine NONE DETECTED NONE DETECTED   Benzodiazepines NONE DETECTED NONE DETECTED   Amphetamines NONE DETECTED NONE DETECTED   Tetrahydrocannabinol NONE DETECTED NONE DETECTED   Barbiturates  NONE DETECTED NONE DETECTED    Comment: (NOTE) DRUG SCREEN FOR MEDICAL PURPOSES ONLY.  IF CONFIRMATION IS NEEDED FOR ANY PURPOSE, NOTIFY LAB WITHIN 5 DAYS.  LOWEST DETECTABLE LIMITS FOR URINE DRUG SCREEN Drug Class                     Cutoff (ng/mL) Amphetamine and metabolites    1000 Barbiturate and metabolites    200 Benzodiazepine                 200 Tricyclics and metabolites     300 Opiates and metabolites        300 Cocaine and metabolites        300 THC                            50 Performed at  [facility], [address], [place], [place] [address]   Comprehensive metabolic panel     Status: Abnormal   Collection Time: 10/17/20  1:55 PM  Result Value Ref Range   Sodium 134 (L) 135 - 145 mmol/L   Potassium 5.4 (H) 3.5 - 5.1 mmol/L   Chloride 99 98 - 111 mmol/L   CO2 26 22 - 32 mmol/L   Glucose, Bld 519 (HH) 70 - 99 mg/dL    Comment: Glucose reference range applies only to samples taken after fasting for at least 8 hours. REPEATED TO VERIFY CRITICAL RESULT CALLED TO, READ BACK BY AND VERIFIED WITH: LITHICUM,E. RN AT 1510 10/17/20 MULLINS,T    BUN 22 8 - 23 mg/dL   Creatinine, Ser 6.04 (H) 0.61 - 1.24 mg/dL   Calcium 9.3 8.9 - 54.0 mg/dL   Total Protein 7.1 6.5 - 8.1 g/dL   Albumin 3.5 3.5 - 5.0 g/dL   AST 14 (L) 15 - 41 U/L   ALT 18 0 - 44 U/L   Alkaline Phosphatase 81 38 - 126 U/L   Total Bilirubin 0.6 0.3 - 1.2 mg/dL   GFR, Estimated 51 (L) >60 mL/min    Comment: (NOTE) Calculated using the CKD-EPI Creatinine Equation (2021)    Anion gap 9 5 - 15    Comment: Performed at [facility], [address], [place], [place] [address]  Ethanol     Status: None   Collection Time: 10/17/20  1:55 PM  Result Value Ref Range   Alcohol, Ethyl (B) <10 <10 mg/dL    Comment: (NOTE) Lowest detectable limit for serum alcohol is 10 mg/dL.  For medical purposes only. Performed at [facility], [address] [place], [place] [address]   CBC with Diff     Status: Abnormal   Collection Time: 10/17/20  1:55 PM  Result Value Ref Range   WBC 6.1 4.0 - 10.5 K/uL   RBC 4.46 4.22 - 5.81 MIL/uL   Hemoglobin 11.9 (L) 13.0 - 17.0 g/dL   HCT 95.6 (L) 21.3 - 08.6 %   MCV 85.2 80.0 - 100.0 fL   MCH 26.7 26.0 - 34.0 pg   MCHC 31.3 30.0 - 36.0 g/dL   RDW 57.8 46.9 - 62.9 %   Platelets 225 150 - 400 K/uL   nRBC 0.0 0.0 - 0.2 %   Neutrophils Relative % 59 %   Neutro Abs 3.6 1.7 - 7.7 K/uL   Lymphocytes Relative 30 %   Lymphs Abs 1.8 0.7 - 4.0 K/uL   Monocytes Relative 8 %   Monocytes Absolute 0.5 0.1 - 1.0 K/uL   Eosinophils Relative 2 %   Eosinophils Absolute 0.1 0.0 - 0.5 K/uL   Basophils Relative 0 %   Basophils Absolute 0.0 0.0 - 0.1 K/uL   Immature Granulocytes 1 %   Abs Immature Granulocytes 0.04 0.00 - 0.07 K/uL    Comment: Performed at [facility], [address] [place], [place] [address]  Acetaminophen level     Status: Abnormal   Collection Time: 10/17/20  1:55 PM  Result Value Ref Range   Acetaminophen (Tylenol), Serum <10 (L) 10 - 30 ug/mL    Comment: (NOTE) Therapeutic concentrations vary significantly. A range of 10-30 ug/mL  may be an effective concentration for many patients. However, some  are best treated at concentrations outside of this range. Acetaminophen concentrations >150 ug/mL at 4 hours after ingestion  and >50 ug/mL  at 12 hours after ingestion are often associated with  toxic reactions.  Performed at [facility], [address] [place], [place] [address]   Salicylate level     Status: Abnormal   Collection Time: 10/17/20  1:55 PM  Result Value Ref Range   Salicylate Lvl <7.0 (L) 7.0 - 30.0 mg/dL    Comment: Performed at [facility], [address], [place], [place] [address]  CBG monitoring, ED     Status: Abnormal   Collection Time: 10/17/20  4:25 PM  Result Value Ref Range   Glucose-Capillary  489 (H) 70 - 99 mg/dL    Comment: Glucose reference range applies only to samples taken after fasting for at least 8 hours.  POC CBG, ED     Status: Abnormal   Collection Time: 10/17/20  8:59 PM  Result Value Ref Range   Glucose-Capillary 167 (H) 70 - 99 mg/dL    Comment: Glucose reference range applies only to samples taken after fasting for at least 8 hours.  POC CBG, ED     Status: Abnormal   Collection Time: 10/18/20  9:44 AM  Result Value Ref Range   Glucose-Capillary 239 (H) 70 - 99 mg/dL    Comment: Glucose reference range applies only to samples taken after fasting for at least 8 hours.  POC CBG, ED     Status: Abnormal   Collection Time: 10/18/20 12:53 PM  Result Value Ref Range   Glucose-Capillary 341 (H) 70 - 99 mg/dL    Comment: Glucose reference range applies only to samples taken after fasting for at least 8 hours.    Current Facility-Administered Medications  Medication Dose Route Frequency Provider Last Rate Last Admin  . acetaminophen (TYLENOL) tablet 650 mg  650 mg Oral Q6H PRN Vanetta Mulders, MD      . amLODipine (NORVASC) tablet 10 mg  10 mg Oral Daily Leevy-Johnson, Brooke A, NP   10 mg at 10/18/20 1014  . benztropine (COGENTIN) tablet 2 mg  2 mg Oral BID Leevy-Johnson, Brooke A, NP   2 mg at 10/18/20 1013  . clindamycin (CLEOCIN) capsule 300 mg  300 mg Oral Q6H Sponseller, Rebekah R, PA-C   300 mg at 10/18/20 1311  . desmopressin (DDAVP) tablet 0.2 mg  0.2 mg Oral QHS Leevy-Johnson, Brooke A, NP   0.2 mg at 10/17/20 2300  . divalproex (DEPAKOTE) DR tablet 500 mg  500 mg Oral Q8H Leevy-Johnson, Brooke A, NP   500 mg at 10/18/20 0620  . ferrous sulfate tablet 325 mg  325 mg Oral QODAY Leevy-Johnson, Brooke A, NP   325 mg at 10/18/20 1015  . gabapentin (NEURONTIN) capsule 600 mg  600 mg Oral BID Leevy-Johnson, Brooke A, NP   600 mg at 10/18/20 1013  . insulin aspart (novoLOG) injection 0-15 Units  0-15 Units Subcutaneous TID WC Lorre Nick, MD   11 Units at  10/18/20 1312  . insulin aspart (novoLOG) injection 0-5 Units  0-5 Units Subcutaneous QHS Lorre Nick, MD      . melatonin tablet 5 mg  5 mg Oral QHS Leevy-Johnson, Brooke A, NP   5 mg at 10/17/20 2051  . metFORMIN (GLUCOPHAGE-XR) 24 hr tablet 1,000 mg  1,000 mg Oral Q breakfast Leevy-Johnson, Brooke A, NP   1,000 mg at 10/18/20 1017  . OLANZapine (ZYPREXA) tablet 5 mg  5 mg Oral BID Leevy-Johnson, Brooke A, NP   5 mg at 10/18/20 1014  . OLANZapine zydis (ZYPREXA) disintegrating  tablet 5 mg  5 mg Oral Q8H PRN Leevy-Johnson, Brooke A, NP       And  . ziprasidone (GEODON) injection 20 mg  20 mg Intramuscular PRN Leevy-Johnson, Brooke A, NP      . pantoprazole (PROTONIX) EC tablet 40 mg  40 mg Oral Daily Leevy-Johnson, Brooke A, NP   40 mg at 10/18/20 1014   Current Outpatient Medications  Medication Sig Dispense Refill  . albuterol (PROVENTIL HFA;VENTOLIN HFA) 108 (90 Base) MCG/ACT inhaler Inhale 1-2 puffs into the lungs every 6 (six) hours as needed for wheezing or shortness of breath. (Patient not taking: Reported on 10/17/2020) 1 Inhaler 2  . amLODipine (NORVASC) 10 MG tablet Take 10 mg by mouth daily. (Patient not taking: Reported on 10/17/2020)    . benztropine (COGENTIN) 2 MG tablet Take 1 tablet (2 mg total) by mouth 2 (two) times daily. (Patient not taking: No sig reported) 30 tablet 0  . desmopressin (DDAVP) 0.2 MG tablet Take 0.2 mg by mouth at bedtime. (Patient not taking: Reported on 10/17/2020)    . divalproex (DEPAKOTE) 500 MG DR tablet Take 1 tablet (500 mg total) by mouth every 8 (eight) hours. (Patient not taking: No sig reported) 90 tablet 0  . ferrous sulfate 325 (65 FE) MG EC tablet Take 1 tablet (325 mg total) by mouth every other day. (Patient not taking: Reported on 10/17/2020) 45 tablet 0  . gabapentin (NEURONTIN) 600 MG tablet Take 600 mg by mouth 2 (two) times daily. (Patient not taking: No sig reported)    . Insulin Glargine (LANTUS SOLOSTAR) 100 UNIT/ML Solostar Pen Inject 35  Units into the skin daily. (Patient not taking: Reported on 10/17/2020) 15 mL 11  . insulin lispro (HUMALOG) 100 UNIT/ML injection Inject 0-10 Units into the skin in the morning and at bedtime. Glucose 70-199, 0 units 200-249, 2 units 250-299, 4 units 300-349, 6 units 350-399, 8 units 400-449, 10 units > 450 Call MD (Patient not taking: Reported on 10/17/2020)    . melatonin 5 MG TABS Take 5 mg by mouth at bedtime. (Patient not taking: Reported on 10/17/2020)    . metformin (FORTAMET) 1000 MG (OSM) 24 hr tablet Take 1 tablet (1,000 mg total) by mouth daily. (Patient not taking: Reported on 10/17/2020) 30 tablet 0  . OLANZapine (ZYPREXA) 5 MG tablet Take 1 tablet (5 mg total) by mouth 3 (three) times daily before meals. (Patient not taking: Reported on 10/17/2020) 30 tablet 0  . omeprazole (PRILOSEC) 20 MG capsule Take 20 mg by mouth 2 (two) times daily before a meal. (Patient not taking: Reported on 10/17/2020)    . polyethylene glycol (MIRALAX / GLYCOLAX) 17 g packet Take 17 g by mouth daily as needed. (Patient not taking: No sig reported) 14 each 0    Musculoskeletal: Strength & Muscle Tone: within normal limits Gait & Station: Did not observe mobility Patient leans: Right            Psychiatric Specialty Exam:  Presentation  General Appearance: Disheveled  Eye Contact:Minimal  Speech:Slurred  Speech Volume:Increased  Handedness:Right   Mood and Affect  Mood:Angry; Irritable; Labile  Affect:Congruent   Thought Process  Thought Processes:Disorganized  Descriptions of Associations:Loose  Orientation:Full (Time, Place and Person)  Thought Content:Logical  History of Schizophrenia/Schizoaffective disorder:Yes  Duration of Psychotic Symptoms:Greater than six months  Hallucinations:Hallucinations: None  Ideas of Reference:None  Suicidal Thoughts:Suicidal Thoughts: No (denied during interview)  Homicidal Thoughts:Homicidal Thoughts: No (denied during  interview)   Sensorium  Memory:Immediate Poor; Recent Poor; Remote Poor  Judgment:Impaired  Insight:Poor   Executive Functions  Concentration:Poor  Attention Span:Poor  Recall:Poor  Fund of Knowledge:Poor  Language:Poor   Psychomotor Activity  Psychomotor Activity:Psychomotor Activity: Other (comment) (appeared to be asleep throughtout interview)   Assets  Assets:Desire for Improvement   Sleep  Sleep:Sleep: Poor Number of Hours of Sleep: 0   Physical Exam: Physical Exam Constitutional:      General: He is sleeping. He is not in acute distress. Pulmonary:     Effort: Pulmonary effort is normal.  Skin:    General: Skin is warm and dry.  Neurological:     Mental Status: He is oriented to person, place, and time and easily aroused. He is lethargic.  Psychiatric:        Attention and Perception: He is inattentive. He does not perceive auditory or visual hallucinations.        Mood and Affect: Affect is labile.        Speech: Speech is slurred.        Behavior: Behavior is agitated.        Thought Content: Thought content is not delusional. Thought content does not include homicidal or suicidal ideation. Thought content does not include homicidal or suicidal plan.    Review of Systems  Unable to perform ROS: Medical condition  Respiratory: Positive for cough (throughtout interview, observed coughing).    Blood pressure 128/90, pulse 90, temperature 98.3 F (36.8 C), temperature source Oral, resp. rate 18, SpO2 97 %. There is no height or weight on file to calculate BMI.  Treatment Plan Summary: Daily contact with patient to assess and evaluate symptoms and progress in treatment and Plan Recommend inpatient psychiatric admission once medically cleared.  Disposition: Recommend psychiatric Inpatient admission when medically cleared. Notified Dr. Deretha Emory and RN of disposition via secure chat.    Novella Olive, NP 10/18/2020 4:25 PM

## 2020-10-18 NOTE — ED Provider Notes (Signed)
Patient is medically cleared.   Vanetta Mulders, MD 10/18/20 1432

## 2020-10-18 NOTE — Progress Notes (Signed)
Inpatient Diabetes Program Recommendations  AACE/ADA: New Consensus Statement on Inpatient Glycemic Control (2015)  Target Ranges:  Prepandial:   less than 140 mg/dL      Peak postprandial:   less than 180 mg/dL (1-2 hours)      Critically ill patients:  140 - 180 mg/dL   Lab Results  Component Value Date   GLUCAP 167 (H) 10/17/2020   HGBA1C 8.5 (H) 03/15/2020    Review of Glycemic Control Results for Dylan Jordan, Dylan Jordan (MRN [id number]) as of 10/18/2020 08:59  Ref. Range 10/17/2020 16:25 10/17/2020 20:59  Glucose-Capillary Latest Ref Range: 70 - 99 mg/dL 973 (H) 532 (H)   Diabetes history: DM 2 Outpatient Diabetes medications: Metformin 1000 mg Daily, Lantus and Humalog (pt can't verify doses therefore MD did not refill when pt was in ED due to running out of medications)  Current orders for Inpatient glycemic control:  Novolog 0-15 units tid + hs Metformin 1000 mg Daily  Inpatient Diabetes Program Recommendations:    Note Glucose 519 only came down with 25 units of Novolog down to 167 this am.  -  Start Lantus 15 units  Thanks,  Christena Deem RN, MSN, BC-ADM Inpatient Diabetes Coordinator Team Pager [phone] (8a-5p)

## 2020-10-18 NOTE — BH Assessment (Addendum)
[facility] Assessment Progress Note  Per Dorena Bodo, NP this voluntary pt requires psychiatric hospitalization at this time.  [facility] is reportedly currently unable to accommodate him due to unstable CBG's.  At 14:16 this writer referred pt to [facility].  Decision is pending as of this writing.  Doylene Canning, [place] Behavioral Health Coordinator [phone]

## 2020-10-18 NOTE — ED Provider Notes (Signed)
Emergency Medicine Observation Re-evaluation Note  Dylan Jordan is a 65 y.o. male, seen on rounds today.  Pt initially presented to the ED for complaints of Hallucinations (Pt endorsed auditory and visual hallucinations, as well as suicidal ideation.  Pt has a dx of Schizoaffective Disorder, Bipolar I) Currently, the patient is requesting Tylenol requesting oral codeine and oral Xanax.  Was originally requesting some kind and no spray for asthma.  Patient is awaiting reevaluated today by psych.  They evaluated him yesterday.  Suspect patient will meet criteria for psychiatric admission.  Patient has been noncompliant on his medications.  But blood sugars are under better control.  We will repeat basic metabolic panel.  Since patient's potassium was 5.4 yesterday with a glucose of 519.  CO2 was 26.  Physical Exam  BP 128/90 (BP Location: Right Arm)   Pulse 90   Temp 98.3 F (36.8 C) (Oral)   Resp 18   SpO2 97%  Physical Exam General: Awake alert some pressured speech.  Having several demands Cardiac: Regular rate and rhythm Lungs: Clear to auscultation no wheezing Psych: Pressured speech but cooperative.  ED Course / MDM  EKG:EKG Interpretation  Date/Time:  Sunday October 17 2020 14:31:53 EDT Ventricular Rate:  87 PR Interval:  181 QRS Duration: 90 QT Interval:  368 QTC Calculation: 443 R Axis:   -3 Text Interpretation: Sinus rhythm Low voltage, precordial leads Abnormal R-wave progression, early transition Probable anteroseptal infarct, old Confirmed by Lorre Nick ([id number]) on 10/17/2020 4:42:47 PM   I have reviewed the labs performed to date as well as medications administered while in observation.  Recent changes in the last 24 hours include as stated we will order BMP.  Plan  Current plan is for we will repeat basic metabolic panel to check his potassium.  Have ordered Tylenol that he has requested.  Patient's Tylenol level not elevated.  Patient is awaiting reevaluation by  psychiatry today.  Suspect he will meet criteria for inpatient treatment. Patient is under full IVC at this time.   Vanetta Mulders, MD 10/18/20 1230

## 2020-10-19 LAB — CBG MONITORING, ED
Glucose-Capillary: 347 mg/dL — ABNORMAL HIGH (ref 70–99)
Glucose-Capillary: 242 mg/dL — ABNORMAL HIGH (ref 70–99)
Glucose-Capillary: 270 mg/dL — ABNORMAL HIGH (ref 70–99)
Glucose-Capillary: 225 mg/dL — ABNORMAL HIGH (ref 70–99)
Glucose-Capillary: 267 mg/dL — ABNORMAL HIGH (ref 70–99)

## 2020-10-19 LAB — RESP PANEL BY RT-PCR (FLU A&B, COVID) ARPGX2
Influenza A by PCR: NEGATIVE
Influenza B by PCR: NEGATIVE
SARS Coronavirus 2 by RT PCR: NEGATIVE

## 2020-10-19 LAB — HEMOGLOBIN A1C
Hgb A1c MFr Bld: 13.6 % — ABNORMAL HIGH (ref 4.8–5.6)
Mean Plasma Glucose: 344 mg/dL

## 2020-10-19 MED ORDER — INSULIN GLARGINE 100 UNIT/ML ~~LOC~~ SOLN
20.0000 [IU] | Freq: Once | SUBCUTANEOUS | Status: AC
  Administered 2020-10-19: 20 [IU] via SUBCUTANEOUS
  Filled 2020-10-19: qty 0.2

## 2020-10-19 MED ORDER — BENZTROPINE MESYLATE 1 MG PO TABS
1.0000 mg | ORAL_TABLET | Freq: Two times a day (BID) | ORAL | Status: DC
  Administered 2020-10-19 – 2020-10-20 (×2): 1 mg via ORAL
  Filled 2020-10-19 (×4): qty 1

## 2020-10-19 MED ORDER — HALOPERIDOL 5 MG PO TABS
15.0000 mg | ORAL_TABLET | Freq: Two times a day (BID) | ORAL | Status: DC
  Administered 2020-10-19 – 2020-10-20 (×2): 15 mg via ORAL
  Filled 2020-10-19 (×4): qty 3

## 2020-10-19 MED ORDER — STERILE WATER FOR INJECTION IJ SOLN
INTRAMUSCULAR | Status: AC
  Filled 2020-10-19: qty 10

## 2020-10-19 NOTE — ED Notes (Signed)
Pt said," I need codeine for my cough and a spray."  No wheezing, SOB or coughing noted.

## 2020-10-19 NOTE — ED Notes (Signed)
Refused antibiotic and insulin. Said to come back in 35 minutes.

## 2020-10-19 NOTE — ED Notes (Signed)
Pt is very controlling, selective, and argumentative when taking his medications. Currently he is not taking his Lantus until "I am ready, come back in 35 minutes."

## 2020-10-19 NOTE — ED Notes (Signed)
Able to do ADLs

## 2020-10-19 NOTE — BH Assessment (Signed)
[facility] Assessment Progress Note  Per Dorena Bodo, NP, this pt requires psychiatric hospitalization.  Mordecai Rasmussen, MD reports that he has accepted pt to [facility] to the service of Dr Neale Burly to Rm 316.  This Clinical research associate spoke to this voluntary pt about the plan.  Pt initially agreed to go, then stated that he would not go anywhere until we fixed his cataracts and his chronic cough.  He also insisted on receiving only name brand pharmaceuticals, not generics.  This has been discussed with EDP Mancel Bale, MD who agrees to determine whether pt meets criteria for IVC.  Pt's nurse, Carollee Herter, have been notified.  If IVC is initiated, please fax IVC documents to [phone] and call report to [phone].  Pt will then be transported via [facility].     Doylene Canning, [place] Behavioral Health Coordinator [phone]

## 2020-10-19 NOTE — Progress Notes (Signed)
Inpatient Diabetes Program Recommendations  AACE/ADA: New Consensus Statement on Inpatient Glycemic Control (2015)  Target Ranges:  Prepandial:   less than 140 mg/dL      Peak postprandial:   less than 180 mg/dL (1-2 hours)      Critically ill patients:  140 - 180 mg/dL   Lab Results  Component Value Date   GLUCAP 347 (H) 10/19/2020   HGBA1C 13.6 (H) 10/17/2020    Review of Glycemic Control Results for KEIR, VIERNES (MRN [id number]) as of 10/19/2020 08:42  Ref. Range 10/18/2020 09:44 10/18/2020 12:53 10/18/2020 17:12 10/19/2020 05:46  Glucose-Capillary Latest Ref Range: 70 - 99 mg/dL 150 (H) 569 (H) 794 (H) 347 (H)  Diabetes history: DM 2 Outpatient Diabetes medications: Metformin 1000 mg Daily, Lantus and Humalog (pt can't verify doses therefore MD did not refill when pt was in ED due to running out of medications)  Current orders for Inpatient glycemic control:  Novolog 0-15 units tid + hs Metformin 1000 mg Daily  Inpatient Diabetes Program Recommendations:   Please add Lantus 20 units daily.   Thanks,  Beryl Meager, RN, BC-ADM Inpatient Diabetes Coordinator Pager [phone] (8a-5p)

## 2020-10-19 NOTE — ED Provider Notes (Signed)
Currently, patient is refusing psychiatric placement, demands to have his cataract repaired and his chronic cough be resolved prior to being admitted to a psychiatric facility.  I have completed involuntary petition, and first examination paperwork on the patient.  I have been in contact with the admitting psychiatrist, who has written orders for medication management.  Anticipate transfer to the psychiatric hospital this afternoon   Mancel Bale, MD 10/19/20 [phone]

## 2020-10-19 NOTE — ED Notes (Signed)
Pt allowed Lantus to be given and wanted it in the back side of his hip. Injection put in subcutaneous tissue.

## 2020-10-19 NOTE — BH Assessment (Signed)
Clinician requested nursing to place TTS machine in patient's room for TTS re-assessment.

## 2020-10-19 NOTE — Consult Note (Signed)
  Psychiatry: I was asked by psychiatry team to review this patient's chart for possible transfer to [facility] psychiatry ward.  Chart reviewed.  Case discussed with inpatient team.  We will be able to accept this patient to our inpatient psychiatry ward this evening.  I will place discharge readmission orders.  Meanwhile, I am going to discontinue the olanzapine, which may possibly be contributing to his elevated blood sugar, and replace it with Haldol 15 twice a day which had been part of his regimen as recently as a couple months ago.  Looks like patient has done well on high doses of Haldol in the past.

## 2020-10-19 NOTE — ED Notes (Signed)
Pt said to come back at 11:30 for Lantus.

## 2020-10-19 NOTE — BH Assessment (Addendum)
Reassessment 10/19/2020:  Patient due for a re-assessment. Upon chart review:" Dylan Jordan is a 65 year old male who presents to [facility] for evaluation of auditory and visual hallucinations; patient later suicidal and homicidal ideations during his triage. He has a psychiatric history of schizoaffective disorder, bipolar type and presents disorganized, elevated, and tangential; extensive medical history. Patient has presented to ED x4 other times in the past 24 hours (arrival to ED 6/5) for various physical and social complaints".   Clinician re-assessed patient via tele assessment. He was observantly laying in the bed, back turned, and covered up in a white sheet. Patient's sitter encouraged patient to cooperative with the TTS re-assessment. Patient yelled, "I'm hearing voices" and "I am manic depressive". He reports on-gong symptoms for the past 2 weeks. States that he has received inpatient psychiatric services and has an ACTT provider ([facility]).  Patient refused to answer any further questions. He became belligerent and made no eye contact. Patient rambled with non sensible/pressured speech. Patient is irate throughout our assessment.Tangential thought processes. Agitated and suspicious, poor historian, disorganized.   Clinician unable to determine if patient has SI and/or HI. He did mention AVH's but refused to disclose any details.   Disposition: Per Hillery Jacks, NP and Dorena Bodo, NP, patient meets inpatient treatment. Disposition Counselor to seek appropriate placement. Patients nurse (Diane, RN), EDP (Dr. Shyrl Numbers), Disposition Counselor (Doylene Canning), and Charge Nurse Kennyth Arnold, RN) provided updates.

## 2020-10-19 NOTE — ED Provider Notes (Signed)
Emergency Medicine Observation Re-evaluation Note  Dylan Jordan is a 65 y.o. male, seen on rounds today.  Pt initially presented to the ED for complaints of Hallucinations (Pt endorsed auditory and visual hallucinations, as well as suicidal ideation.  Pt has a dx of Schizoaffective Disorder, Bipolar I) Currently, the patient is for patient to be placed.  He is slightly agitated.  Physical Exam  BP 128/73 (BP Location: Left Arm)   Pulse 84   Temp 98.2 F (36.8 C) (Oral)   Resp 16   SpO2 96%  Physical Exam General: No acute distress Cardiac: Regular rate Lungs: No respiratory distress Psych: Alert  ED Course / MDM  EKG:EKG Interpretation  Date/Time:  Sunday October 17 2020 14:31:53 EDT Ventricular Rate:  87 PR Interval:  181 QRS Duration: 90 QT Interval:  368 QTC Calculation: 443 R Axis:   -3 Text Interpretation: Sinus rhythm Low voltage, precordial leads Abnormal R-wave progression, early transition Probable anteroseptal infarct, old Confirmed by Lorre Nick ([id number]) on 10/17/2020 4:42:47 PM   I have reviewed the labs performed to date as well as medications administered while in observation.  Recent changes in the last 24 hours include: No problems overnight, patient slightly more agitated now per nurse.  -Patient's blood sugar is elevated.  We received a call from diabetes coordinator to start patient on Lantus 20 units daily.  Plan  Current plan is for patient to stay in the ER, waiting for placement. Patient is not under full IVC at this time.   Derwood Kaplan, MD 10/19/20 1151

## 2020-10-19 NOTE — ED Notes (Signed)
Pt is argumentative with everyone and is also difficult to understand.

## 2020-10-20 ENCOUNTER — Other Ambulatory Visit: Payer: Self-pay

## 2020-10-20 ENCOUNTER — Inpatient Hospital Stay
Admission: RE | Admit: 2020-10-20 | Discharge: 2020-10-28 | DRG: 885 | Disposition: A | Discharge status: Home / Self Care | Payer: Medicaid Other | Source: Intra-hospital | Attending: Behavioral Health | Admitting: Behavioral Health

## 2020-10-20 ENCOUNTER — Encounter: Payer: Self-pay | Admitting: Psychiatry

## 2020-10-20 DIAGNOSIS — J4530 Mild persistent asthma, uncomplicated: Secondary | ICD-10-CM | POA: Diagnosis present

## 2020-10-20 DIAGNOSIS — Z9119 Patient's noncompliance with other medical treatment and regimen: Secondary | ICD-10-CM

## 2020-10-20 DIAGNOSIS — Z96652 Presence of left artificial knee joint: Secondary | ICD-10-CM | POA: Diagnosis present

## 2020-10-20 DIAGNOSIS — N1831 Chronic kidney disease, stage 3a: Secondary | ICD-10-CM | POA: Diagnosis not present

## 2020-10-20 DIAGNOSIS — R45850 Homicidal ideations: Secondary | ICD-10-CM | POA: Diagnosis present

## 2020-10-20 DIAGNOSIS — R45851 Suicidal ideations: Secondary | ICD-10-CM | POA: Diagnosis present

## 2020-10-20 DIAGNOSIS — M25561 Pain in right knee: Secondary | ICD-10-CM | POA: Diagnosis present

## 2020-10-20 DIAGNOSIS — F17210 Nicotine dependence, cigarettes, uncomplicated: Secondary | ICD-10-CM | POA: Diagnosis present

## 2020-10-20 DIAGNOSIS — K219 Gastro-esophageal reflux disease without esophagitis: Secondary | ICD-10-CM | POA: Diagnosis present

## 2020-10-20 DIAGNOSIS — F259 Schizoaffective disorder, unspecified: Secondary | ICD-10-CM | POA: Diagnosis not present

## 2020-10-20 DIAGNOSIS — F250 Schizoaffective disorder, bipolar type: Secondary | ICD-10-CM | POA: Diagnosis present

## 2020-10-20 DIAGNOSIS — Z20822 Contact with and (suspected) exposure to covid-19: Secondary | ICD-10-CM | POA: Diagnosis present

## 2020-10-20 DIAGNOSIS — Z7984 Long term (current) use of oral hypoglycemic drugs: Secondary | ICD-10-CM | POA: Diagnosis not present

## 2020-10-20 DIAGNOSIS — Z79899 Other long term (current) drug therapy: Secondary | ICD-10-CM | POA: Diagnosis not present

## 2020-10-20 DIAGNOSIS — K5901 Slow transit constipation: Secondary | ICD-10-CM | POA: Diagnosis present

## 2020-10-20 DIAGNOSIS — E785 Hyperlipidemia, unspecified: Secondary | ICD-10-CM | POA: Diagnosis present

## 2020-10-20 DIAGNOSIS — Z794 Long term (current) use of insulin: Secondary | ICD-10-CM | POA: Diagnosis not present

## 2020-10-20 DIAGNOSIS — F203 Undifferentiated schizophrenia: Secondary | ICD-10-CM | POA: Diagnosis present

## 2020-10-20 DIAGNOSIS — R079 Chest pain, unspecified: Secondary | ICD-10-CM | POA: Diagnosis not present

## 2020-10-20 DIAGNOSIS — I10 Essential (primary) hypertension: Secondary | ICD-10-CM | POA: Diagnosis present

## 2020-10-20 DIAGNOSIS — G4700 Insomnia, unspecified: Secondary | ICD-10-CM | POA: Diagnosis present

## 2020-10-20 DIAGNOSIS — E1165 Type 2 diabetes mellitus with hyperglycemia: Secondary | ICD-10-CM | POA: Diagnosis present

## 2020-10-20 DIAGNOSIS — J45909 Unspecified asthma, uncomplicated: Secondary | ICD-10-CM | POA: Diagnosis not present

## 2020-10-20 DIAGNOSIS — I129 Hypertensive chronic kidney disease with stage 1 through stage 4 chronic kidney disease, or unspecified chronic kidney disease: Secondary | ICD-10-CM | POA: Diagnosis not present

## 2020-10-20 DIAGNOSIS — E1140 Type 2 diabetes mellitus with diabetic neuropathy, unspecified: Secondary | ICD-10-CM | POA: Diagnosis not present

## 2020-10-20 DIAGNOSIS — Z5900 Homelessness unspecified: Secondary | ICD-10-CM | POA: Diagnosis present

## 2020-10-20 LAB — GLUCOSE, CAPILLARY
Glucose-Capillary: 286 mg/dL — ABNORMAL HIGH (ref 70–99)
Glucose-Capillary: 386 mg/dL — ABNORMAL HIGH (ref 70–99)

## 2020-10-20 LAB — CBG MONITORING, ED
Glucose-Capillary: 237 mg/dL — ABNORMAL HIGH (ref 70–99)
Glucose-Capillary: 378 mg/dL — ABNORMAL HIGH (ref 70–99)

## 2020-10-20 MED ORDER — CLINDAMYCIN HCL 150 MG PO CAPS: 300.0000 mg | ORAL_CAPSULE | Freq: Four times a day (QID) | ORAL | Status: DC

## 2020-10-20 MED ORDER — ZIPRASIDONE MESYLATE 20 MG IM SOLR
20.0000 mg | INTRAMUSCULAR | Status: AC | PRN
  Administered 2020-10-20: 20 mg via INTRAMUSCULAR
  Filled 2020-10-20: qty 20

## 2020-10-20 MED ORDER — AMLODIPINE BESYLATE 5 MG PO TABS
10.0000 mg | ORAL_TABLET | Freq: Every day | ORAL | Status: DC
  Administered 2020-10-21 – 2020-10-28 (×8): 10 mg via ORAL
  Filled 2020-10-20 (×8): qty 2

## 2020-10-20 MED ORDER — LISINOPRIL 5 MG PO TABS
15.0000 mg | ORAL_TABLET | Freq: Every day | ORAL | Status: DC
  Administered 2020-10-20 – 2020-10-28 (×9): 15 mg via ORAL
  Filled 2020-10-20 (×9): qty 3

## 2020-10-20 MED ORDER — INSULIN ASPART 100 UNIT/ML IJ SOLN
0.0000 [IU] | Freq: Every day | INTRAMUSCULAR | Status: DC
Start: 2020-10-20 — End: 2020-10-21
  Administered 2020-10-20: 5 [IU] via SUBCUTANEOUS
  Filled 2020-10-20: qty 1

## 2020-10-20 MED ORDER — INSULIN ASPART 100 UNIT/ML IJ SOLN
0.0000 [IU] | Freq: Three times a day (TID) | INTRAMUSCULAR | Status: DC
Start: 2020-10-20 — End: 2020-10-21
  Administered 2020-10-20 – 2020-10-21 (×2): 8 [IU] via SUBCUTANEOUS
  Filled 2020-10-20 (×2): qty 1

## 2020-10-20 MED ORDER — ATORVASTATIN CALCIUM 20 MG PO TABS
40.0000 mg | ORAL_TABLET | Freq: Every day | ORAL | Status: DC
  Administered 2020-10-20 – 2020-10-27 (×8): 40 mg via ORAL
  Filled 2020-10-20 (×8): qty 2

## 2020-10-20 MED ORDER — METFORMIN HCL ER 500 MG PO TB24
1000.0000 mg | ORAL_TABLET | Freq: Every day | ORAL | Status: DC
  Administered 2020-10-21 – 2020-10-28 (×8): 1000 mg via ORAL
  Filled 2020-10-20 (×9): qty 2

## 2020-10-20 MED ORDER — GABAPENTIN 300 MG PO CAPS: 600.0000 mg | ORAL_CAPSULE | Freq: Two times a day (BID) | ORAL | Status: DC

## 2020-10-20 MED ORDER — INSULIN GLARGINE 100 UNIT/ML ~~LOC~~ SOLN
20.0000 [IU] | Freq: Every day | SUBCUTANEOUS | Status: DC
  Administered 2020-10-20 – 2020-10-21 (×2): 20 [IU] via SUBCUTANEOUS
  Filled 2020-10-20 (×2): qty 0.2

## 2020-10-20 MED ORDER — FERROUS SULFATE 325 (65 FE) MG PO TABS
325.0000 mg | ORAL_TABLET | ORAL | Status: DC
  Administered 2020-10-22 – 2020-10-28 (×4): 325 mg via ORAL
  Filled 2020-10-20 (×4): qty 1

## 2020-10-20 MED ORDER — BENZTROPINE MESYLATE 1 MG PO TABS
1.0000 mg | ORAL_TABLET | Freq: Two times a day (BID) | ORAL | Status: DC
  Administered 2020-10-20 – 2020-10-28 (×16): 1 mg via ORAL
  Filled 2020-10-20 (×16): qty 1

## 2020-10-20 MED ORDER — GABAPENTIN 300 MG PO CAPS
600.0000 mg | ORAL_CAPSULE | Freq: Three times a day (TID) | ORAL | Status: DC
  Administered 2020-10-20 – 2020-10-28 (×24): 600 mg via ORAL
  Filled 2020-10-20 (×24): qty 2

## 2020-10-20 MED ORDER — PANTOPRAZOLE SODIUM 40 MG PO TBEC
40.0000 mg | DELAYED_RELEASE_TABLET | Freq: Every day | ORAL | Status: DC
  Administered 2020-10-21 – 2020-10-28 (×8): 40 mg via ORAL
  Filled 2020-10-20 (×9): qty 1

## 2020-10-20 MED ORDER — ALUM & MAG HYDROXIDE-SIMETH 200-200-20 MG/5ML PO SUSP: 30.0000 mL | ORAL | Status: DC | PRN

## 2020-10-20 MED ORDER — DIVALPROEX SODIUM 500 MG PO DR TAB
500.0000 mg | DELAYED_RELEASE_TABLET | Freq: Three times a day (TID) | ORAL | Status: DC
  Administered 2020-10-20 – 2020-10-21 (×2): 500 mg via ORAL
  Filled 2020-10-20 (×2): qty 1

## 2020-10-20 MED ORDER — NICOTINE POLACRILEX 2 MG MT GUM: 2.0000 mg | CHEWING_GUM | OROMUCOSAL | Status: DC | PRN

## 2020-10-20 MED ORDER — ZIPRASIDONE MESYLATE 20 MG IM SOLR: 20.0000 mg | Freq: Four times a day (QID) | INTRAMUSCULAR | Status: DC | PRN

## 2020-10-20 MED ORDER — HALOPERIDOL 5 MG PO TABS: 15.0000 mg | ORAL_TABLET | Freq: Two times a day (BID) | ORAL | Status: DC

## 2020-10-20 MED ORDER — ACETAMINOPHEN 325 MG PO TABS: 650.0000 mg | ORAL_TABLET | Freq: Four times a day (QID) | ORAL | Status: DC | PRN

## 2020-10-20 MED ORDER — MELATONIN 5 MG PO TABS
5.0000 mg | ORAL_TABLET | Freq: Every day | ORAL | Status: DC
  Administered 2020-10-20 – 2020-10-27 (×8): 5 mg via ORAL
  Filled 2020-10-20 (×8): qty 1

## 2020-10-20 MED ORDER — DESMOPRESSIN ACETATE 0.2 MG PO TABS
0.2000 mg | ORAL_TABLET | Freq: Every day | ORAL | Status: DC
  Administered 2020-10-20 – 2020-10-27 (×8): 0.2 mg via ORAL
  Filled 2020-10-20 (×9): qty 1

## 2020-10-20 MED ORDER — OLANZAPINE 5 MG PO TBDP
5.0000 mg | ORAL_TABLET | Freq: Three times a day (TID) | ORAL | Status: DC | PRN
  Administered 2020-10-21: 5 mg via ORAL
  Filled 2020-10-20: qty 1

## 2020-10-20 MED ORDER — ALBUTEROL SULFATE HFA 108 (90 BASE) MCG/ACT IN AERS
1.0000 | INHALATION_SPRAY | RESPIRATORY_TRACT | Status: DC | PRN
  Administered 2020-10-20 – 2020-10-27 (×8): 2 via RESPIRATORY_TRACT
  Filled 2020-10-20: qty 6.7

## 2020-10-20 MED ORDER — ACETAMINOPHEN 325 MG PO TABS
650.0000 mg | ORAL_TABLET | Freq: Four times a day (QID) | ORAL | Status: DC | PRN
  Administered 2020-10-24 – 2020-10-28 (×12): 650 mg via ORAL
  Filled 2020-10-20 (×13): qty 2

## 2020-10-20 MED ORDER — MAGNESIUM HYDROXIDE 400 MG/5ML PO SUSP: 30.0000 mL | Freq: Every day | ORAL | Status: DC | PRN

## 2020-10-20 MED ORDER — BISACODYL 5 MG PO TBEC
5.0000 mg | DELAYED_RELEASE_TABLET | Freq: Every day | ORAL | Status: DC
  Administered 2020-10-20 – 2020-10-28 (×9): 5 mg via ORAL
  Filled 2020-10-20 (×9): qty 1

## 2020-10-20 MED ORDER — BISACODYL 5 MG PO TBEC
5.0000 mg | DELAYED_RELEASE_TABLET | Freq: Every day | ORAL | Status: DC | PRN
  Filled 2020-10-20: qty 1

## 2020-10-20 MED ORDER — HALOPERIDOL 5 MG PO TABS
20.0000 mg | ORAL_TABLET | Freq: Two times a day (BID) | ORAL | Status: DC
  Administered 2020-10-20: 20 mg via ORAL
  Filled 2020-10-20 (×2): qty 4

## 2020-10-20 NOTE — Progress Notes (Signed)
Pt was agitated, irritable and non compliant when arriving on the unit. His speech is hard to understand however he was expressing delusions and angry about his belongings being lost at [facility].He was not easily redirected. He was med compliant. He ate his meal. Pt was educated on the care plan and he verbalized understanding. Torrie Mayers RN

## 2020-10-20 NOTE — Progress Notes (Signed)
Pt rates depression and anxiety both 7/10. Pt denies SI, HI and VH but is having AH. Pt was was cooperative and answered most of my questions. He fell asleep and is calm. Torrie Mayers RN

## 2020-10-20 NOTE — H&P (Signed)
Psychiatric Admission Assessment Adult  Patient Identification: Dylan Jordan MRN:  [id number] Date of Evaluation:  10/20/2020 Chief Complaint:  Schizophrenia, undifferentiated (HCC) [F20.3] Principal Diagnosis: Schizoaffective disorder, bipolar type (HCC) Diagnosis:  Principal Problem:   Schizoaffective disorder, bipolar type (HCC) Active Problems:   HTN (hypertension)   Mild persistent asthma   Slow transit constipation   Uncontrolled type 2 diabetes mellitus with hyperglycemia (HCC)  CC "Work for me, I'll work for you."  History of Present Illness: 65 year old male with schizoaffective disorder bipolar type presenting for disorganized behavior, elevated mood, auditory and visual hallucinations, suicidal ideations, and homicidal ideations.  On admission patient is irate in cursing and refusing to come in the building with the presence of 6 police officers.  He required escort to the back call and as needed medication.  On my assessment he is refusing to answer any questions.  He notes that the other hospital has lost all of his belongings.  He also states we need to call and get his Social Security reinstated.  He does not [place] to answer any review of system questions.  He will also not participate in giving any history.  Per chart review patient has a history of multiple past admissions for treatment resistant schizoaffective disorder.  Most notably he [facility].  In February of this year he was on Haldol long-acting injectable 400 mg monthly and Geodon 60 mg twice a day.  As of May 2020 his regimen has changed to Depakote 500 twice a day Zyprexa 5 mg 3 times a day and Cogentin.  Patient also has a history of hyperglycemia and poorly controlled diabetes.  Zyprexa was discontinued due to its metabolic profile.  He was restarted on Haldol at outside hospital.  Associated Signs/Symptoms: Depression Symptoms:  insomnia, recurrent thoughts of  death, Duration of Depression Symptoms: No data recorded (Hypo) Manic Symptoms:  Distractibility, Flight of Ideas, Impulsivity, Irritable Mood, Anxiety Symptoms:  Excessive Worry, Psychotic Symptoms:  Hallucinations: Auditory Visual PTSD Symptoms: Negative Total Time spent with patient: 30 minutes  Past Psychiatric History: Multiple past hospitalizations.  He has failed monotherapy with Abilify, Invega, Risperdal, Geodon, and Haldol.  He is followed by strategic act team.  Most recently was on Depakote, Zyprexa and Cogentin.  He has a history of noncompliance.  He also has failed ECT in the past with 12 sessions showing no improvement on psychosis.  No history of suicide attempts.  Denies any recent substance abuse and UDS and alcohol levels negative on admission.  Is the patient at risk to self? Yes.    Has the patient been a risk to self in the past 6 months? Yes.    Has the patient been a risk to self within the distant past? Yes.    Is the patient a risk to others? Yes.    Has the patient been a risk to others in the past 6 months? Yes.    Has the patient been a risk to others within the distant past? Yes.     Prior Inpatient Therapy:   Prior Outpatient Therapy:    Alcohol Screening:   Substance Abuse History in the last 12 months:  No. Consequences of Substance Abuse: Negative Previous Psychotropic Medications: Yes  Psychological Evaluations: No  Past Medical History:  Past Medical History:  Diagnosis Date  . Anemia   . Asthma   . Diabetes mellitus without complication (HCC)   . Gout   . Hyperlipidemia   .  Hyperosmolar hyperglycemic coma due to diabetes mellitus without ketoacidosis (HCC)   . Hypertension   . Schizophrenia (HCC) 07/06/2018    Past Surgical History:  Procedure Laterality Date  . REPLACEMENT TOTAL KNEE Left   . TOE SURGERY     Family History:  Family History  Adopted: Yes  Problem Relation Age of Onset  . Allergic rhinitis Neg Hx   . Angioedema  Neg Hx   . Asthma Neg Hx   . Atopy Neg Hx   . Eczema Neg Hx   . Immunodeficiency Neg Hx   . Urticaria Neg Hx    Family Psychiatric  History: per chart review no known family psychiatric history Tobacco Screening:   Social History:  Social History   Substance and Sexual Activity  Alcohol Use No     Social History   Substance and Sexual Activity  Drug Use No    Additional Social History:                           Allergies:   Allergies  Allergen Reactions  . Chantix [Varenicline Tartrate] Other (See Comments)    Nightmares  . Ativan [Lorazepam] Other (See Comments)    Agitation (and "5 others like it") Will need to call [facility] [place] (phone) [phone] and [phone] to fax a release-   . Other Other (See Comments)    Chinese Rubber Tree Stem Bark- sneezing  . Pineapple Other (See Comments)    Pt states "all my allergies make me feel eerie." " It does not make me feel absolute."   . Penicillins Diarrhea   Lab Results:  Results for orders placed or performed during the hospital encounter of 10/17/20 (from the past 48 hour(s))  POC CBG, ED     Status: Abnormal   Collection Time: 10/18/20  5:12 PM  Result Value Ref Range   Glucose-Capillary 242 (H) 70 - 99 mg/dL    Comment: Glucose reference range applies only to samples taken after fasting for at least 8 hours.  Basic metabolic panel     Status: Abnormal   Collection Time: 10/18/20  5:46 PM  Result Value Ref Range   Sodium 136 135 - 145 mmol/L   Potassium 4.4 3.5 - 5.1 mmol/L    Comment: DELTA CHECK NOTED   Chloride 102 98 - 111 mmol/L   CO2 29 22 - 32 mmol/L   Glucose, Bld 242 (H) 70 - 99 mg/dL    Comment: Glucose reference range applies only to samples taken after fasting for at least 8 hours.   BUN 21 8 - 23 mg/dL   Creatinine, Ser 6.60 (H) 0.61 - 1.24 mg/dL   Calcium 9.1 8.9 - 63.0 mg/dL   GFR, Estimated 52 (L) >60 mL/min    Comment: (NOTE) Calculated using the CKD-EPI  Creatinine Equation (2021)    Anion gap 5 5 - 15    Comment: Performed at [facility], [address], [place], [place] [address]  POC CBG, ED     Status: Abnormal   Collection Time: 10/19/20  5:46 AM  Result Value Ref Range   Glucose-Capillary 347 (H) 70 - 99 mg/dL    Comment: Glucose reference range applies only to samples taken after fasting for at least 8 hours.  POC CBG, ED     Status: Abnormal   Collection Time: 10/19/20 10:22 AM  Result Value Ref Range   Glucose-Capillary 242 (H) 70 -  99 mg/dL    Comment: Glucose reference range applies only to samples taken after fasting for at least 8 hours.  POC CBG, ED     Status: Abnormal   Collection Time: 10/19/20 12:07 PM  Result Value Ref Range   Glucose-Capillary 270 (H) 70 - 99 mg/dL    Comment: Glucose reference range applies only to samples taken after fasting for at least 8 hours.  POC CBG, ED     Status: Abnormal   Collection Time: 10/19/20  4:17 PM  Result Value Ref Range   Glucose-Capillary 225 (H) 70 - 99 mg/dL    Comment: Glucose reference range applies only to samples taken after fasting for at least 8 hours.  Resp Panel by RT-PCR (Flu A&B, Covid) Nasopharyngeal Swab     Status: None   Collection Time: 10/19/20  6:20 PM   Specimen: Nasopharyngeal Swab; Nasopharyngeal(NP) swabs in vial transport medium  Result Value Ref Range   SARS Coronavirus 2 by RT PCR NEGATIVE NEGATIVE    Comment: (NOTE) SARS-CoV-2 target nucleic acids are NOT DETECTED.  The SARS-CoV-2 RNA is generally detectable in upper respiratory specimens during the acute phase of infection. The lowest concentration of SARS-CoV-2 viral copies this assay can detect is 138 copies/mL. A negative result does not preclude SARS-Cov-2 infection and should not be used as the sole basis for treatment or other patient management decisions. A negative result may occur with  improper specimen collection/handling, submission of specimen other than  nasopharyngeal swab, presence of viral mutation(s) within the areas targeted by this assay, and inadequate number of viral copies(<138 copies/mL). A negative result must be combined with clinical observations, patient history, and epidemiological information. The expected result is Negative.  Fact Sheet for Patients:  BloggerCourse.com  Fact Sheet for Healthcare Providers:  SeriousBroker.it  This test is no t yet approved or cleared by the [place] FDA and  has been authorized for detection and/or diagnosis of SARS-CoV-2 by FDA under an Emergency Use Authorization (EUA). This EUA will remain  in effect (meaning this test can be used) for the duration of the COVID-19 declaration under Section 564(b)(1) of the Act, 21 U.S.C.section 360bbb-3(b)(1), unless the authorization is terminated  or revoked sooner.       Influenza A by PCR NEGATIVE NEGATIVE   Influenza B by PCR NEGATIVE NEGATIVE    Comment: (NOTE) The Xpert Xpress SARS-CoV-2/FLU/RSV plus assay is intended as an aid in the diagnosis of influenza from Nasopharyngeal swab specimens and should not be used as a sole basis for treatment. Nasal washings and aspirates are unacceptable for Xpert Xpress SARS-CoV-2/FLU/RSV testing.  Fact Sheet for Patients: BloggerCourse.com  Fact Sheet for Healthcare Providers: SeriousBroker.it  This test is not yet approved or cleared by the [place] FDA and has been authorized for detection and/or diagnosis of SARS-CoV-2 by FDA under an Emergency Use Authorization (EUA). This EUA will remain in effect (meaning this test can be used) for the duration of the COVID-19 declaration under Section 564(b)(1) of the Act, 21 U.S.C. section 360bbb-3(b)(1), unless the authorization is terminated or revoked.  Performed at [facility], [address] [place], [place]  [address]   POC CBG, ED     Status: Abnormal   Collection Time: 10/19/20  8:24 PM  Result Value Ref Range   Glucose-Capillary 267 (H) 70 - 99 mg/dL    Comment: Glucose reference range applies only to samples taken after fasting for at least 8 hours.  POC CBG, ED  Status: Abnormal   Collection Time: 10/20/20  9:20 AM  Result Value Ref Range   Glucose-Capillary 237 (H) 70 - 99 mg/dL    Comment: Glucose reference range applies only to samples taken after fasting for at least 8 hours.  POC CBG, ED     Status: Abnormal   Collection Time: 10/20/20  1:16 PM  Result Value Ref Range   Glucose-Capillary 378 (H) 70 - 99 mg/dL    Comment: Glucose reference range applies only to samples taken after fasting for at least 8 hours.    Blood Alcohol level:  Lab Results  Component Value Date   ETH <10 10/17/2020   ETH <10 10/16/2020    Metabolic Disorder Labs:  Lab Results  Component Value Date   HGBA1C 13.6 (H) 10/17/2020   MPG 344 10/17/2020   MPG 197.25 03/15/2020   No results found for: PROLACTIN Lab Results  Component Value Date   CHOL 187 03/18/2018   TRIG 352 (H) 03/18/2018   HDL 40 03/18/2018   CHOLHDL 4.7 03/18/2018   LDLCALC 77 03/18/2018    Current Medications: Current Facility-Administered Medications  Medication Dose Route Frequency Provider Last Rate Last Admin  . acetaminophen (TYLENOL) tablet 650 mg  650 mg Oral Q6H PRN Clapacs, John T, MD      . alum & mag hydroxide-simeth (MAALOX/MYLANTA) 200-200-20 MG/5ML suspension 30 mL  30 mL Oral Q4H PRN Clapacs, Jackquline Denmark, MD      . Melene Muller ON 10/21/2020] amLODipine (NORVASC) tablet 10 mg  10 mg Oral Daily Clapacs, John T, MD      . atorvastatin (LIPITOR) tablet 40 mg  40 mg Oral QHS Jesse Sans, MD      . benztropine (COGENTIN) tablet 1 mg  1 mg Oral BID Clapacs, John T, MD      . bisacodyl (DULCOLAX) EC tablet 5 mg  5 mg Oral Daily PRN Jesse Sans, MD      . clindamycin (CLEOCIN) capsule 300 mg  300 mg Oral Q6H Clapacs,  John T, MD      . desmopressin (DDAVP) tablet 0.2 mg  0.2 mg Oral QHS Clapacs, John T, MD      . divalproex (DEPAKOTE) DR tablet 500 mg  500 mg Oral Q8H Clapacs, John T, MD      . Melene Muller ON 10/22/2020] ferrous sulfate tablet 325 mg  325 mg Oral QODAY Clapacs, John T, MD      . gabapentin (NEURONTIN) capsule 600 mg  600 mg Oral TID Jesse Sans, MD      . haloperidol (HALDOL) tablet 20 mg  20 mg Oral BID Jesse Sans, MD      . insulin aspart (novoLOG) injection 0-15 Units  0-15 Units Subcutaneous TID WC Clapacs, John T, MD      . insulin aspart (novoLOG) injection 0-5 Units  0-5 Units Subcutaneous QHS Clapacs, John T, MD      . insulin glargine (LANTUS) injection 20 Units  20 Units Subcutaneous Daily Les Pou M, MD      . lisinopril (ZESTRIL) tablet 15 mg  15 mg Oral Daily Les Pou M, MD      . magnesium hydroxide (MILK OF MAGNESIA) suspension 30 mL  30 mL Oral Daily PRN Clapacs, John T, MD      . melatonin tablet 5 mg  5 mg Oral QHS Clapacs, John T, MD      . Melene Muller ON 10/21/2020] metFORMIN (GLUCOPHAGE-XR) 24 hr tablet 1,000 mg  1,000 mg Oral Q breakfast Clapacs, John T, MD      . nicotine polacrilex (NICORETTE) gum 2 mg  2 mg Oral Q4H PRN Jesse Sans, MD      . OLANZapine zydis (ZYPREXA) disintegrating tablet 5 mg  5 mg Oral Q8H PRN Clapacs, John T, MD       And  . ziprasidone (GEODON) injection 20 mg  20 mg Intramuscular PRN Clapacs, Jackquline Denmark, MD      . Melene Muller ON 10/21/2020] pantoprazole (PROTONIX) EC tablet 40 mg  40 mg Oral Daily Clapacs, John T, MD       PTA Medications: Medications Prior to Admission  Medication Sig Dispense Refill Last Dose  . albuterol (PROVENTIL HFA;VENTOLIN HFA) 108 (90 Base) MCG/ACT inhaler Inhale 1-2 puffs into the lungs every 6 (six) hours as needed for wheezing or shortness of breath. (Patient not taking: Reported on 10/17/2020) 1 Inhaler 2   . amLODipine (NORVASC) 10 MG tablet Take 10 mg by mouth daily. (Patient not taking: Reported on 10/17/2020)      . benztropine (COGENTIN) 2 MG tablet Take 1 tablet (2 mg total) by mouth 2 (two) times daily. (Patient not taking: No sig reported) 30 tablet 0   . desmopressin (DDAVP) 0.2 MG tablet Take 0.2 mg by mouth at bedtime. (Patient not taking: Reported on 10/17/2020)     . divalproex (DEPAKOTE) 500 MG DR tablet Take 1 tablet (500 mg total) by mouth every 8 (eight) hours. (Patient not taking: No sig reported) 90 tablet 0   . ferrous sulfate 325 (65 FE) MG EC tablet Take 1 tablet (325 mg total) by mouth every other day. (Patient not taking: Reported on 10/17/2020) 45 tablet 0   . gabapentin (NEURONTIN) 600 MG tablet Take 600 mg by mouth 2 (two) times daily. (Patient not taking: No sig reported)     . Insulin Glargine (LANTUS SOLOSTAR) 100 UNIT/ML Solostar Pen Inject 35 Units into the skin daily. (Patient not taking: Reported on 10/17/2020) 15 mL 11   . insulin lispro (HUMALOG) 100 UNIT/ML injection Inject 0-10 Units into the skin in the morning and at bedtime. Glucose 70-199, 0 units 200-249, 2 units 250-299, 4 units 300-349, 6 units 350-399, 8 units 400-449, 10 units > 450 Call MD (Patient not taking: Reported on 10/17/2020)     . melatonin 5 MG TABS Take 5 mg by mouth at bedtime. (Patient not taking: Reported on 10/17/2020)     . metformin (FORTAMET) 1000 MG (OSM) 24 hr tablet Take 1 tablet (1,000 mg total) by mouth daily. (Patient not taking: Reported on 10/17/2020) 30 tablet 0   . OLANZapine (ZYPREXA) 5 MG tablet Take 1 tablet (5 mg total) by mouth 3 (three) times daily before meals. (Patient not taking: Reported on 10/17/2020) 30 tablet 0   . omeprazole (PRILOSEC) 20 MG capsule Take 20 mg by mouth 2 (two) times daily before a meal. (Patient not taking: Reported on 10/17/2020)     . polyethylene glycol (MIRALAX / GLYCOLAX) 17 g packet Take 17 g by mouth daily as needed. (Patient not taking: No sig reported) 14 each 0     Musculoskeletal: Strength & Muscle Tone: within normal limits Gait & Station:  normal Patient leans: N/A            Psychiatric Specialty Exam:  Presentation  General Appearance: Disheveled  Eye Contact:Minimal  Speech:Pressured  Speech Volume:Increased  Handedness:Right   Mood and Affect  Mood:Angry; Irritable  Affect:Labile   Thought Process  Thought Processes:Goal Directed  Duration of Psychotic Symptoms: Greater than six months  Past Diagnosis of Schizophrenia or Psychoactive disorder: Yes  Descriptions of Associations:Intact  Orientation:Full (Time, Place and Person)  Thought Content:Perseveration  Hallucinations:Hallucinations: Auditory; Visual  Ideas of Reference:Paranoia  Suicidal Thoughts:Suicidal Thoughts: Yes, Passive  Homicidal Thoughts:Homicidal Thoughts: Yes, Passive   Sensorium  Memory:Immediate Poor; Recent Poor; Remote Poor  Judgment:Impaired  Insight:Poor   Executive Functions  Concentration:Poor  Attention Span:Poor  Recall:Poor  Fund of Knowledge:Poor  Language:Poor   Psychomotor Activity  Psychomotor Activity:Psychomotor Activity: Increased   Assets  Assets:Desire for Improvement   Sleep  Sleep:Sleep: Poor    Physical Exam: Physical Exam Vitals and nursing note reviewed.  Constitutional:      Appearance: Normal appearance.  HENT:     Head: Normocephalic and atraumatic.     Right Ear: External ear normal.     Left Ear: External ear normal.     Nose: Nose normal.     Mouth/Throat:     Mouth: Mucous membranes are moist.     Pharynx: Oropharynx is clear.  Eyes:     Extraocular Movements: Extraocular movements intact.     Conjunctiva/sclera: Conjunctivae normal.     Pupils: Pupils are equal, round, and reactive to light.  Cardiovascular:     Rate and Rhythm: Normal rate.     Pulses: Normal pulses.  Pulmonary:     Effort: Pulmonary effort is normal.     Breath sounds: Normal breath sounds.  Abdominal:     General: Abdomen is flat.     Palpations: Abdomen is soft.   Musculoskeletal:        General: No swelling. Normal range of motion.     Cervical back: Normal range of motion and neck supple.  Skin:    General: Skin is warm and dry.  Neurological:     General: No focal deficit present.     Mental Status: He is alert and oriented to person, place, and time.  Psychiatric:        Attention and Perception: He is inattentive. He perceives auditory and visual hallucinations.        Mood and Affect: Affect is angry.        Speech: Speech is rapid and pressured.        Behavior: Behavior is agitated and aggressive.        Thought Content: Thought content is paranoid. Thought content includes homicidal and suicidal ideation.        Cognition and Memory: Cognition is impaired. Memory is impaired.        Judgment: Judgment is inappropriate.    Review of Systems  Unable to perform ROS: Acuity of condition   There were no vitals taken for this visit. There is no height or weight on file to calculate BMI.  Treatment Plan Summary: Daily contact with patient to assess and evaluate symptoms and progress in treatment and Medication management  1.  Schizoaffective disorder, bipolar type -Continue Depakote 500 mg 3 times a day, check valproic acid level in the morning - Increase Haldol 20 mg twice a day (patient previously on Haldol 400 mg monthly).  Continue Cogentin 1 mg twice a day for EPS prophylaxis  2.  Hypertension - Continue amlodipine 10 mg daily, restart lisinopril 15 mg daily  3.  Hyperlipidemia - Restart Lipitor 40 mg daily at bedtime  4.  Constipation - Dulcolax 5 mg daily  5.  Diabetes mellitus, uncontrolled - Resume metformin 1000 mg daily Lantus  20 mg daily.  Correctional insulin with meals and bedtime, diabetic coordinator consult.  Hemoglobin A1c 13.6 at outside hospital.  Continue gabapentin 600 mg 3 times a day for neuropathic pain  6.  Tobacco use disorder -Nicorette gum  7.  GERD -Protonix 40 mg daily    Observation  Level/Precautions:  15 minute checks  Laboratory:  lipid panel, valproic acid level  Psychotherapy:    Medications:    Consultations:    Discharge Concerns:    Estimated LOS:  Other:     Physician Treatment Plan for Primary Diagnosis: Schizoaffective disorder, bipolar type (HCC) Long Term Goal(s): Improvement in symptoms so as ready for discharge  Short Term Goals: Ability to identify changes in lifestyle to reduce recurrence of condition will improve, Ability to verbalize feelings will improve, Ability to disclose and discuss suicidal ideas, Ability to demonstrate self-control will improve, Ability to identify and develop effective coping behaviors will improve, Ability to maintain clinical measurements within normal limits will improve and Compliance with prescribed medications will improve  Physician Treatment Plan for Secondary Diagnosis: Principal Problem:   Schizoaffective disorder, bipolar type (HCC) Active Problems:   HTN (hypertension)   Mild persistent asthma   Slow transit constipation   Uncontrolled type 2 diabetes mellitus with hyperglycemia (HCC)  Long Term Goal(s): Improvement in symptoms so as ready for discharge  Short Term Goals: Ability to identify changes in lifestyle to reduce recurrence of condition will improve, Ability to verbalize feelings will improve, Ability to disclose and discuss suicidal ideas, Ability to demonstrate self-control will improve, Ability to identify and develop effective coping behaviors will improve, Ability to maintain clinical measurements within normal limits will improve and Compliance with prescribed medications will improve  I certify that inpatient services furnished can reasonably be expected to improve the patient's condition.    Jesse Sans, MD 6/8/20224:04 PM

## 2020-10-20 NOTE — ED Provider Notes (Signed)
Emergency Medicine Observation Re-evaluation Note  Dylan Jordan is a 65 y.o. male, seen on rounds today.  Pt initially presented to the ED for complaints of Hallucinations (Pt endorsed auditory and visual hallucinations, as well as suicidal ideation.  Pt has a dx of Schizoaffective Disorder, Bipolar I) Currently, the patient is resting comfortably.  Physical Exam  BP (!) 134/93 (BP Location: Right Arm)   Pulse 96   Temp 98.8 F (37.1 C) (Oral)   Resp 20   Ht 6\' 4"  (1.93 m)   Wt 103.2 kg   SpO2 98%   BMI 27.69 kg/m  Physical Exam General: resting comfortably   ED Course / MDM  EKG:EKG Interpretation  Date/Time:  Sunday October 17 2020 14:31:53 EDT Ventricular Rate:  87 PR Interval:  181 QRS Duration: 90 QT Interval:  368 QTC Calculation: 443 R Axis:   -3 Text Interpretation: Sinus rhythm Low voltage, precordial leads Abnormal R-wave progression, early transition Probable anteroseptal infarct, old Confirmed by 05-19-2005 (Lorre Nick) on 10/17/2020 4:42:47 PM   I have reviewed the labs performed to date as well as medications administered while in observation.  Recent changes in the last 24 hours include no acute events reported by staff.  Plan  Current plan is for placement - likely at [facility] Patient is under full IVC at this time.   [facility], MD 10/20/20 [phone]

## 2020-10-20 NOTE — ED Notes (Signed)
Pt DC off unit to [facility] per provider. Pt alert, no s/s of distress.  DC information given to sheriff. Belongings missing, Hospital doctor notified for a report. Pt ambulatory.  Pt off unit in w/c, escorted and transported by [facility]

## 2020-10-20 NOTE — ED Notes (Signed)
Pt still refusing vitals.  

## 2020-10-20 NOTE — Tx Team (Signed)
Initial Treatment Plan 10/20/2020 5:11 PM [name] ZOX:[id number]   PATIENT STRESSORS: Medication non-compliance   PATIENT IDENTIFIED PROBLEMS: Financial issues  Disorganized thoughts                   DISCHARGE CRITERIA:  Improved stabilization in mood, thinking, and/or behavior Need for constant or close observation no longer present  PRELIMINARY DISCHARGE PLAN: Return to previous living arrangement  PATIENT/FAMILY INVOLVEMENT: This treatment plan has been presented to and reviewed with the patient, Dylan Jordan.The patient has been given the opportunity to ask questions and make suggestions.  Celene Kras, RN 10/20/2020, 5:11 PM

## 2020-10-20 NOTE — BHH Suicide Risk Assessment (Signed)
[facility] Admission Suicide Risk Assessment   Nursing information obtained from:    Demographic factors:    Current Mental Status:    Loss Factors:    Historical Factors:    Risk Reduction Factors:     Total Time spent with patient: 30 minutes Principal Problem: Schizoaffective disorder, bipolar type (HCC) Diagnosis:  Principal Problem:   Schizoaffective disorder, bipolar type (HCC) Active Problems:   HTN (hypertension)   Mild persistent asthma   Slow transit constipation   Uncontrolled type 2 diabetes mellitus with hyperglycemia (HCC)  Subjective Data: 65 year old male with schizoaffective disorder bipolar type presenting for disorganized behavior, elevated mood, auditory and visual hallucinations, suicidal ideations, and homicidal ideations.  On admission patient is irate in cursing and refusing to come in the building with the presence of 6 police officers.  He required escort to the back call and as needed medication.  On my assessment he is refusing to answer any questions.  He notes that the other hospital has lost all of his belongings.  He also states we need to call and get his Social Security reinstated.  He does not [place] to answer any review of system questions.  He will also not participate in giving any history.  Per chart review patient has a history of multiple past admissions for treatment resistant schizoaffective disorder.  Most notably he failed 12 sessions of ECT at [facility] in February 2022.  In February of this year he was on Haldol long-acting injectable 400 mg monthly and Geodon 60 mg twice a day.  As of May 2020 his regimen has changed to Depakote 500 twice a day Zyprexa 5 mg 3 times a day and Cogentin.  Patient also has a history of hyperglycemia and poorly controlled diabetes.  Zyprexa was discontinued due to its metabolic profile.  He was restarted on Haldol at outside hospital.  Continued Clinical Symptoms:    The "Alcohol Use Disorders Identification Test",  Guidelines for Use in Primary Care, Second Edition.  World Science writer [facility]). Score between 0-7:  no or low risk or alcohol related problems. Score between 8-15:  moderate risk of alcohol related problems. Score between 16-19:  high risk of alcohol related problems. Score 20 or above:  warrants further diagnostic evaluation for alcohol dependence and treatment.   CLINICAL FACTORS:   Severe Anxiety and/or Agitation Schizophrenia:   Paranoid or undifferentiated type More than one psychiatric diagnosis Currently Psychotic Unstable or Poor Therapeutic Relationship Previous Psychiatric Diagnoses and Treatments Medical Diagnoses and Treatments/Surgeries   Musculoskeletal: Strength & Muscle Tone: within normal limits Gait & Station: normal Patient leans: N/A  Psychiatric Specialty Exam:  Presentation  General Appearance: Disheveled  Eye Contact:Minimal  Speech:Pressured  Speech Volume:Increased  Handedness:Right   Mood and Affect  Mood:Angry; Irritable  Affect:Labile   Thought Process  Thought Processes:Goal Directed  Descriptions of Associations:Intact  Orientation:Full (Time, Place and Person)  Thought Content:Perseveration  History of Schizophrenia/Schizoaffective disorder:Yes  Duration of Psychotic Symptoms:Greater than six months  Hallucinations:Hallucinations: Auditory; Visual  Ideas of Reference:Paranoia  Suicidal Thoughts:Suicidal Thoughts: Yes, Passive  Homicidal Thoughts:Homicidal Thoughts: Yes, Passive   Sensorium  Memory:Immediate Poor; Recent Poor; Remote Poor  Judgment:Impaired  Insight:Poor   Executive Functions  Concentration:Poor  Attention Span:Poor  Recall:Poor  Fund of Knowledge:Poor  Language:Poor   Psychomotor Activity  Psychomotor Activity:Psychomotor Activity: Increased   Assets  Assets:Desire for Improvement   Sleep  Sleep:Sleep: Poor    Physical Exam: Physical Exam ROS There were no vitals  taken for this  visit. There is no height or weight on file to calculate BMI.   COGNITIVE FEATURES THAT CONTRIBUTE TO RISK:  Closed-mindedness and Loss of executive function    SUICIDE RISK:   Mild:  Suicidal ideation of limited frequency, intensity, duration, and specificity.  There are no identifiable plans, no associated intent, mild dysphoria and related symptoms, good self-control (both objective and subjective assessment), few other risk factors, and identifiable protective factors, including available and accessible social support.  PLAN OF CARE: Continue inpatient admission, see H&P for details.   I certify that inpatient services furnished can reasonably be expected to improve the patient's condition.   Jesse Sans, MD 10/20/2020, 4:14 PM

## 2020-10-20 NOTE — ED Notes (Addendum)
Pt refused vitals and blood sugar, will try again later.

## 2020-10-21 LAB — LIPID PANEL
Cholesterol: 206 mg/dL — ABNORMAL HIGH (ref 0–200)
HDL: 35 mg/dL — ABNORMAL LOW (ref >40)
LDL Cholesterol: 98 mg/dL (ref 0–99)
Total CHOL/HDL Ratio: 5.9 RATIO
Triglycerides: 367 mg/dL — ABNORMAL HIGH (ref <150)
VLDL: 73 mg/dL — ABNORMAL HIGH (ref 0–40)

## 2020-10-21 LAB — GLUCOSE, CAPILLARY
Glucose-Capillary: 257 mg/dL — ABNORMAL HIGH (ref 70–99)
Glucose-Capillary: 407 mg/dL — ABNORMAL HIGH (ref 70–99)
Glucose-Capillary: 411 mg/dL — ABNORMAL HIGH (ref 70–99)
Glucose-Capillary: 172 mg/dL — ABNORMAL HIGH (ref 70–99)
Glucose-Capillary: 308 mg/dL — ABNORMAL HIGH (ref 70–99)

## 2020-10-21 LAB — VALPROIC ACID LEVEL: Valproic Acid Lvl: 39 ug/mL — ABNORMAL LOW (ref 50.0–100.0)

## 2020-10-21 MED ORDER — DIVALPROEX SODIUM 500 MG PO DR TAB
1000.0000 mg | DELAYED_RELEASE_TABLET | Freq: Two times a day (BID) | ORAL | Status: DC
  Administered 2020-10-21 – 2020-10-25 (×4): 1000 mg via ORAL
  Filled 2020-10-21 (×7): qty 2

## 2020-10-21 MED ORDER — PALIPERIDONE ER 3 MG PO TB24
9.0000 mg | ORAL_TABLET | Freq: Every day | ORAL | Status: DC
  Administered 2020-10-21 – 2020-10-28 (×8): 9 mg via ORAL
  Filled 2020-10-21 (×8): qty 3

## 2020-10-21 MED ORDER — INSULIN ASPART 100 UNIT/ML IJ SOLN
0.0000 [IU] | Freq: Three times a day (TID) | INTRAMUSCULAR | Status: DC
Start: 2020-10-21 — End: 2020-10-28
  Administered 2020-10-21: 3 [IU] via SUBCUTANEOUS
  Administered 2020-10-21: 15 [IU] via SUBCUTANEOUS
  Administered 2020-10-22: 3 [IU] via SUBCUTANEOUS
  Administered 2020-10-22: 5 [IU] via SUBCUTANEOUS
  Administered 2020-10-22: 8 [IU] via SUBCUTANEOUS
  Administered 2020-10-23: 2 [IU] via SUBCUTANEOUS
  Administered 2020-10-23: 8 [IU] via SUBCUTANEOUS
  Administered 2020-10-23 – 2020-10-24 (×3): 5 [IU] via SUBCUTANEOUS
  Administered 2020-10-24: 3 [IU] via SUBCUTANEOUS
  Administered 2020-10-25: 11 [IU] via SUBCUTANEOUS
  Administered 2020-10-25: 8 [IU] via SUBCUTANEOUS
  Administered 2020-10-26: 3 [IU] via SUBCUTANEOUS
  Administered 2020-10-26: 15 [IU] via SUBCUTANEOUS
  Administered 2020-10-26: 5 [IU] via SUBCUTANEOUS
  Administered 2020-10-27: 8 [IU] via SUBCUTANEOUS
  Administered 2020-10-27 – 2020-10-28 (×2): 5 [IU] via SUBCUTANEOUS
  Administered 2020-10-28: 0 [IU] via SUBCUTANEOUS
  Filled 2020-10-21 (×12): qty 1

## 2020-10-21 MED ORDER — OLANZAPINE 10 MG PO TABS
10.0000 mg | ORAL_TABLET | Freq: Three times a day (TID) | ORAL | Status: DC | PRN
  Administered 2020-10-21 – 2020-10-25 (×5): 10 mg via ORAL
  Filled 2020-10-21 (×5): qty 1

## 2020-10-21 MED ORDER — ZIPRASIDONE MESYLATE 20 MG IM SOLR
20.0000 mg | Freq: Three times a day (TID) | INTRAMUSCULAR | Status: DC | PRN
  Administered 2020-10-22: 20 mg via INTRAMUSCULAR
  Filled 2020-10-21: qty 20

## 2020-10-21 MED ORDER — INSULIN GLARGINE 100 UNIT/ML ~~LOC~~ SOLN
25.0000 [IU] | Freq: Every day | SUBCUTANEOUS | Status: DC
  Administered 2020-10-22: 25 [IU] via SUBCUTANEOUS
  Filled 2020-10-21: qty 0.25

## 2020-10-21 MED ORDER — INSULIN ASPART 100 UNIT/ML IJ SOLN
0.0000 [IU] | Freq: Every day | INTRAMUSCULAR | Status: DC
  Administered 2020-10-21: 4 [IU] via SUBCUTANEOUS
  Administered 2020-10-22 – 2020-10-24 (×2): 3 [IU] via SUBCUTANEOUS
  Filled 2020-10-21 (×2): qty 1

## 2020-10-21 MED ORDER — INSULIN ASPART 100 UNIT/ML IJ SOLN
4.0000 [IU] | Freq: Three times a day (TID) | INTRAMUSCULAR | Status: DC
  Administered 2020-10-21 – 2020-10-25 (×12): 4 [IU] via SUBCUTANEOUS
  Filled 2020-10-21 (×6): qty 1

## 2020-10-21 MED ORDER — LIVING WELL WITH DIABETES BOOK
Freq: Once | Status: AC
  Filled 2020-10-21: qty 1

## 2020-10-21 NOTE — Progress Notes (Signed)
PT Cancellation Note  Patient Details Name: Dylan Jordan MRN: [id number] DOB: 07-28-1955   Cancelled Treatment:    Reason Eval/Treat Not Completed: Other (comment)  Patient agitated and uncooperative at this time. Unable to complete evaluation. Will re-attempt at later time/day. (Patient up walking around without AD)   Carina Chaplin 10/21/2020, 11:23 AM

## 2020-10-21 NOTE — BHH Counselor (Signed)
[name] spoke to [employer] with written consent of patient to discuss housing and social security. Representative with [facility] confirms that the patient is homeless as he has been banned from all housing resources they have pursued. Patient is followed by Dr. Jeannine Kitten. Furthermore, patient DOES NOT have a guardian at this time.  Patient requested [name] discuss progress of social security funds. [facility] reports that they have submitted the application for SS and remains in process with no updates. [name] will inform patient of status.   Signed:  Corky Crafts, MSW, [place], LCASA 10/21/2020 3:31 PM

## 2020-10-21 NOTE — BHH Counselor (Addendum)
[name] attempted to complete PSA with patient. During PSA, patient's answers were unreliable and/or not useful. For example, when attempting to obtain childhood history, patient reported he does not know who raised him, unable to provide details about living environment during childhood. When asked about memory impairment, he replied he has no issues remembering and that he "lived alone in [place], [place] as an adult, teenager, and child. . . in a house"   Patient has little insight into reason for hospitalization. Reports that he is here because of "prejudices against a brother." Additionally he reports that he is here because " knowledge and intellegence do not agree;" at this point, the patient demanded that the writer be a scribe and write as he spoke incoherently.   [name] will make additional attempts to complete PSA with more reliable information.   Patient has been agitated throughout most of the day and posturing himself in a threatening manor. BMU staff closely monitoring situation.   Signed:  Corky Crafts, MSW, LCSWA, LCASA 10/21/2020 11:59 AM

## 2020-10-21 NOTE — Progress Notes (Signed)
Recreation Therapy Notes   Date: 10/21/2020  Time: 10:00 am   Location: Craft room  Behavioral response: N/A   Intervention Topic: Communication    Discussion/Intervention: Patient did not attend group.   Clinical Observations/Feedback:  Patient did not attend group.   Rusty Glodowski LRT/CTRS         Kandiss Ihrig 10/21/2020 12:17 PM

## 2020-10-21 NOTE — Progress Notes (Signed)
Inpatient Diabetes Program Recommendations  AACE/ADA: New Consensus Statement on Inpatient Glycemic Control (2015)  Target Ranges:  Prepandial:   less than 140 mg/dL      Peak postprandial:   less than 180 mg/dL (1-2 hours)      Critically ill patients:  140 - 180 mg/dL   Lab Results  Component Value Date   GLUCAP 257 (H) 10/21/2020   HGBA1C 13.6 (H) 10/17/2020    Review of Glycemic Control Results for Dylan Jordan, Dylan Jordan (MRN [id number]) as of 10/21/2020 07:30  Ref. Range 10/20/2020 09:20 10/20/2020 13:16 10/20/2020 16:11 10/20/2020 21:28 10/21/2020 06:45  Glucose-Capillary Latest Ref Range: 70 - 99 mg/dL 127 (H) 517 (H) 001 (H) 386 (H) 257 (H)   Diabetes history: DM2 Outpatient Diabetes medications: Lantus 35 units QD (not taking), Humalog 0-10 units TID (not taking), Metformin 1000 mg QD (not taking) Current orders for Inpatient glycemic control: Lantus 20 units QD, Novolog 0-15 units TID and 0-5 units QHS  Inpatient Diabetes Program Recommendations:    Please consider,  Lantus 25 units QD Novolog 4 units TID with meals if eats at least 50%  Will continue to follow while inpatient.  Thank you, Dulce Sellar, RN, BSN Diabetes Coordinator Inpatient Diabetes Program [phone] (team pager from 8a-5p)

## 2020-10-21 NOTE — Progress Notes (Signed)
[facility] MD Progress Note  10/21/2020 12:07 PM Dylan Jordan  MRN:  [id number]  CC "The hospital stole my stuff!"  Subjective:  65 year old male with schizoaffective disorder bipolar type presenting for disorganized behavior, elevated mood, auditory and visual hallucinations, suicidal ideations, and homicidal ideations. No acute events overnight, medication compliant aside from Haldol, ADLs impaired.  This morning patient seen one-on-one.  He quickly becomes very agitated yelling that the other hospital stole all his belongings.  He demands I retrieve his clothes, wallet, cane.  He then goes on a long series tangential demands that I cannot quite understand secondary to his pressured speech and dysarthria.  There are mention of teeth, Social Security cards, and disability.  Patient is irate and begins yelling and posturing when I attempt to reassure him that we have called [name] to inquire about his belongings.  Interview terminated at this point for safety.  Per nursing staff patient did say he refused Haldol because he is supposed to be on [place].  Haldol discontinued and replaced with Hinda Glatter that he took willingly.  Fingerstick glucose levels remain very elevated this morning.  Appreciate diabetic coordinator input.  Lantus increased to 25 units, schedule NovoLog 4 units with meals along with sliding scale coverage.  Principal Problem: Schizoaffective disorder, bipolar type (HCC) Diagnosis: Principal Problem:   Schizoaffective disorder, bipolar type (HCC) Active Problems:   HTN (hypertension)   Mild persistent asthma   Slow transit constipation   Uncontrolled type 2 diabetes mellitus with hyperglycemia (HCC)  Total Time spent with patient: 30 minutes  Past Psychiatric History: See H&P  Past Medical History:  Past Medical History:  Diagnosis Date   Anemia    Asthma    Diabetes mellitus without complication (HCC)    Gout    Hyperlipidemia    Hyperosmolar hyperglycemic coma due to  diabetes mellitus without ketoacidosis (HCC)    Hypertension    Schizophrenia (HCC) 07/06/2018    Past Surgical History:  Procedure Laterality Date   REPLACEMENT TOTAL KNEE Left    TOE SURGERY     Family History:  Family History  Adopted: Yes  Problem Relation Age of Onset   Allergic rhinitis Neg Hx    Angioedema Neg Hx    Asthma Neg Hx    Atopy Neg Hx    Eczema Neg Hx    Immunodeficiency Neg Hx    Urticaria Neg Hx    Family Psychiatric  History: See H&P Social History:  Social History   Substance and Sexual Activity  Alcohol Use No     Social History   Substance and Sexual Activity  Drug Use No    Social History   Socioeconomic History   Marital status: Single    Spouse name: Not on file   Number of children: Not on file   Years of education: Not on file   Highest education level: Not on file  Occupational History   Not on file  Tobacco Use   Smoking status: Some Days    Packs/day: 0.30    Years: 45.00    Pack years: 13.50    Types: Cigarettes    Start date: 05/16/1971   Smokeless tobacco: Never   Tobacco comments:    Decreased Intake to 10 cigarettes per week or less.  Vaping Use   Vaping Use: Never used  Substance and Sexual Activity   Alcohol use: No   Drug use: No   Sexual activity: Not on file  Other Topics Concern   Not  on file  Social History Narrative   Lives by himself at home. Closest family member is niece.       ETOH none   Drugs non e   Current smoker, none for three days    Social Determinants of Health   Financial Resource Strain: Not on file  Food Insecurity: Not on file  Transportation Needs: Not on file  Physical Activity: Not on file  Stress: Not on file  Social Connections: Not on file   Additional Social History:                         Sleep: Fair  Appetite:  Fair  Current Medications: Current Facility-Administered Medications  Medication Dose Route Frequency Provider Last Rate Last Admin    acetaminophen (TYLENOL) tablet 650 mg  650 mg Oral Q6H PRN Clapacs, John T, MD       albuterol (VENTOLIN HFA) 108 (90 Base) MCG/ACT inhaler 1-2 puff  1-2 puff Inhalation Q4H PRN Jesse Sans, MD   2 puff at 10/20/20 1639   alum & mag hydroxide-simeth (MAALOX/MYLANTA) 200-200-20 MG/5ML suspension 30 mL  30 mL Oral Q4H PRN Clapacs, John T, MD       amLODipine (NORVASC) tablet 10 mg  10 mg Oral Daily Clapacs, John T, MD   10 mg at 10/21/20 0744   atorvastatin (LIPITOR) tablet 40 mg  40 mg Oral QHS Jesse Sans, MD   40 mg at 10/20/20 2129   benztropine (COGENTIN) tablet 1 mg  1 mg Oral BID Clapacs, John T, MD   1 mg at 10/21/20 0745   bisacodyl (DULCOLAX) EC tablet 5 mg  5 mg Oral Daily Jesse Sans, MD   5 mg at 10/21/20 0745   desmopressin (DDAVP) tablet 0.2 mg  0.2 mg Oral QHS Clapacs, John T, MD   0.2 mg at 10/20/20 2256   divalproex (DEPAKOTE) DR tablet 1,000 mg  1,000 mg Oral Q12H Jesse Sans, MD       Melene Muller ON 10/22/2020] ferrous sulfate tablet 325 mg  325 mg Oral QODAY Clapacs, John T, MD       gabapentin (NEURONTIN) capsule 600 mg  600 mg Oral TID Jesse Sans, MD   600 mg at 10/21/20 1137   insulin aspart (novoLOG) injection 0-15 Units  0-15 Units Subcutaneous TID WC Jesse Sans, MD   15 Units at 10/21/20 1138   insulin aspart (novoLOG) injection 0-5 Units  0-5 Units Subcutaneous QHS Jesse Sans, MD       insulin aspart (novoLOG) injection 4 Units  4 Units Subcutaneous TID WC Jesse Sans, MD   4 Units at 10/21/20 1137   [START ON 10/22/2020] insulin glargine (LANTUS) injection 25 Units  25 Units Subcutaneous Daily Les Pou M, MD       lisinopril (ZESTRIL) tablet 15 mg  15 mg Oral Daily Jesse Sans, MD   15 mg at 10/21/20 0744   living well with diabetes book MISC   Does not apply Once Clapacs, Jackquline Denmark, MD       magnesium hydroxide (MILK OF MAGNESIA) suspension 30 mL  30 mL Oral Daily PRN Clapacs, Jackquline Denmark, MD       melatonin tablet 5 mg  5 mg Oral  QHS Clapacs, John T, MD   5 mg at 10/20/20 2130   metFORMIN (GLUCOPHAGE-XR) 24 hr tablet 1,000 mg  1,000 mg Oral Q breakfast Clapacs, Jackquline Denmark, MD  1,000 mg at 10/21/20 0834   nicotine polacrilex (NICORETTE) gum 2 mg  2 mg Oral Q4H PRN Jesse Sans, MD       OLANZapine [facility]) tablet 10 mg  10 mg Oral Q8H PRN Jesse Sans, MD       paliperidone (INVEGA) 24 hr tablet 9 mg  9 mg Oral Daily Les Pou M, MD   9 mg at 10/21/20 1049   pantoprazole (PROTONIX) EC tablet 40 mg  40 mg Oral Daily Clapacs, Jackquline Denmark, MD   40 mg at 10/21/20 0745   ziprasidone (GEODON) injection 20 mg  20 mg Intramuscular Q8H PRN Jesse Sans, MD        Lab Results:  Results for orders placed or performed during the hospital encounter of 10/20/20 (from the past 48 hour(s))  Glucose, capillary     Status: Abnormal   Collection Time: 10/20/20  4:11 PM  Result Value Ref Range   Glucose-Capillary 286 (H) 70 - 99 mg/dL    Comment: Glucose reference range applies only to samples taken after fasting for at least 8 hours.  Glucose, capillary     Status: Abnormal   Collection Time: 10/20/20  9:28 PM  Result Value Ref Range   Glucose-Capillary 386 (H) 70 - 99 mg/dL    Comment: Glucose reference range applies only to samples taken after fasting for at least 8 hours.  Lipid panel     Status: Abnormal   Collection Time: 10/21/20  6:37 AM  Result Value Ref Range   Cholesterol 206 (H) 0 - 200 mg/dL   Triglycerides 196 (H) <150 mg/dL   HDL 35 (L) >22 mg/dL   Total CHOL/HDL Ratio 5.9 RATIO   VLDL 73 (H) 0 - 40 mg/dL   LDL Cholesterol 98 0 - 99 mg/dL    Comment:        Total Cholesterol/HDL:CHD Risk Coronary Heart Disease Risk Table                     Men   Women  1/2 Average Risk   3.4   3.3  Average Risk       5.0   4.4  2 X Average Risk   9.6   7.1  3 X Average Risk  23.4   11.0        Use the calculated Patient Ratio above and the CHD Risk Table to determine the patient's CHD Risk.        ATP III  CLASSIFICATION (LDL):  <100     mg/dL   Optimal  297-989  mg/dL   Near or Above                    Optimal  130-159  mg/dL   Borderline  211-941  mg/dL   High  >740     mg/dL   Very High Performed at [facility], [address] [place], [place] [address]   Valproic acid level     Status: Abnormal   Collection Time: 10/21/20  6:37 AM  Result Value Ref Range   Valproic Acid Lvl 39 (L) 50.0 - 100.0 ug/mL    Comment: Performed at [facility], [address] [place], [place] [address]  Glucose, capillary     Status: Abnormal   Collection Time: 10/21/20  6:45 AM  Result Value Ref Range   Glucose-Capillary 257 (H) 70 - 99 mg/dL    Comment: Glucose reference range applies  only to samples taken after fasting for at least 8 hours.   Comment 1 Notify RN   Glucose, capillary     Status: Abnormal   Collection Time: 10/21/20 11:31 AM  Result Value Ref Range   Glucose-Capillary 407 (H) 70 - 99 mg/dL    Comment: Glucose reference range applies only to samples taken after fasting for at least 8 hours.    Blood Alcohol level:  Lab Results  Component Value Date   ETH <10 10/17/2020   ETH <10 10/16/2020    Metabolic Disorder Labs: Lab Results  Component Value Date   HGBA1C 13.6 (H) 10/17/2020   MPG 344 10/17/2020   MPG 197.25 03/15/2020   No results found for: PROLACTIN Lab Results  Component Value Date   CHOL 206 (H) 10/21/2020   TRIG 367 (H) 10/21/2020   HDL 35 (L) 10/21/2020   CHOLHDL 5.9 10/21/2020   VLDL 73 (H) 10/21/2020   LDLCALC 98 10/21/2020   LDLCALC 77 03/18/2018    Physical Findings: AIMS:  , ,  ,  ,    CIWA:    COWS:     Musculoskeletal: Strength & Muscle Tone: within normal limits Gait & Station: normal Patient leans: N/A  Psychiatric Specialty Exam:  Presentation  General Appearance: Disheveled  Eye Contact:Other (comment) ([place], glaring)  Speech:Pressured; Slurred  Speech Volume:Increased  Handedness:Right   Mood and  Affect  Mood:Angry; Irritable  Affect:Labile   Thought Process  Thought Processes:Disorganized  Descriptions of Associations:Intact  Orientation:Full (Time, Place and Person)  Thought Content:Obsessions; Delusions; Scattered  History of Schizophrenia/Schizoaffective disorder:Yes  Duration of Psychotic Symptoms:Greater than six months  Hallucinations:Hallucinations: Auditory; Visual  Ideas of Reference:Paranoia; Percusatory; Delusions  Suicidal Thoughts:Suicidal Thoughts: Yes, Passive  Homicidal Thoughts:Homicidal Thoughts: Yes, Passive   Sensorium  Memory:Immediate Poor; Recent Poor; Remote Poor  Judgment:Impaired  Insight:Poor   Executive Functions  Concentration:Poor  Attention Span:Poor  Recall:Poor  Fund of Knowledge:Poor  Language:Poor   Psychomotor Activity  Psychomotor Activity:Psychomotor Activity: Increased   Assets  Assets:Desire for Improvement   Sleep  Sleep:Sleep: Fair Number of Hours of Sleep: 7    Physical Exam: Physical Exam ROS Blood pressure (!) 123/92, pulse (!) 119, temperature 98.2 F (36.8 C), temperature source Oral, resp. rate 16, height 6\' 4"  (1.93 m), weight 103.2 kg, SpO2 (!) 69 %. Body mass index is 27.69 kg/m.   Treatment Plan Summary: Daily contact with patient to assess and evaluate symptoms and progress in treatment and Medication management 1.  Schizoaffective disorder, bipolar type - Increase Depakote 1000 mg BID (VPA level 39 on 500 mg TID), -Discontinue Haldol has patient unwilling to take this medication -Start Invega 9 mg nightly as patient states he is willing to take this medication.  We will try to encourage him to transition to long-acting injectable if no side effects noted -Continue Cogentin 1 mg twice a day for EPS prophylaxis   2.  Hypertension - Continue amlodipine 10 mg daily, lisinopril 15 mg daily   3.  Hyperlipidemia - Continue Lipitor 40 mg daily at bedtime   4.  Constipation -  Dulcolax 5 mg daily   5.  Diabetes mellitus, uncontrolled - Appreciate input from diabetic coordinator.  Continue metformin 1000 mg daily Lantus 25 mg daily.  NovoLog 4 units 3 times daily with meals in addition to correctional insulin with meals and bedtime.   Hemoglobin A1c 13.6 at outside hospital.  Continue gabapentin 600 mg 3 times a day for neuropathic pain   6.  Tobacco use disorder -Nicorette gum   7.  GERD -Protonix 40 mg daily Jesse Sans, MD 10/21/2020, 12:07 PM

## 2020-10-21 NOTE — BHH Group Notes (Signed)
LCSW Group Therapy Note  10/21/2020 2:17 PM  Type of Therapy/Topic:  Group Therapy:  Balance in Life  Participation Level:  None  Description of Group:    This group will address the concept of balance and how it feels and looks when one is unbalanced. Patients will be encouraged to process areas in their lives that are out of balance and identify reasons for remaining unbalanced. Facilitators will guide patients in utilizing problem-solving interventions to address and correct the stressor making their life unbalanced. Understanding and applying boundaries will be explored and addressed for obtaining and maintaining a balanced life. Patients will be encouraged to explore ways to assertively make their unbalanced needs known to significant others in their lives, using other group members and facilitator for support and feedback.  Therapeutic Goals: Patient will identify two or more emotions or situations they have that consume much of in their lives. Patient will identify signs/triggers that life has become out of balance:  Patient will identify two ways to set boundaries in order to achieve balance in their lives:  Patient will demonstrate ability to communicate their needs through discussion and/or role plays  Summary of Patient Progress: Patient came into the group late and then left shortly afterwards.  Therapeutic Modalities:   Cognitive Behavioral Therapy Solution-Focused Therapy Assertiveness Training  [employer]. Algis Greenhouse, MSW, LCSW, LCAS 10/21/2020 2:17 PM

## 2020-10-21 NOTE — Progress Notes (Signed)
D: Pt alert and oriented. Pt does not report any anxiety/depression at this time. Pt is irritable/intrusive/needy/and demanding. Pt denies experiencing any pain at this time. Pt denies experiencing any SI/HI, or AVH at this time.   Pt continues to try to be in control of the TV in the dayroom. Pt likes take the remote and keep it to himself. Moods are labile.  A: Scheduled medications administered to pt, per MD orders. Support and encouragement provided. Frequent verbal contact made. Routine safety checks conducted q15 minutes.   R: No adverse drug reactions noted. Pt verbally contracts for safety at this time. Pt complaint with most medications. Pt does not interacts well with others on the unit. Pt remains safe at this time. Will continue to monitor.

## 2020-10-21 NOTE — Progress Notes (Signed)
   10/21/20 2100  Psych Admission Type (Psych Patients Only)  Admission Status Other (Comment)  Psychosocial Assessment  Patient Complaints Anxiety;Agitation;Irritability  Eye Contact Intense  Facial Expression Sullen  Affect Labile;Sullen  Speech Loud  Interaction Demanding  Motor Activity Restless  Appearance/Hygiene Unremarkable  Behavior Characteristics Agressive verbally;Intrusive;Irritable;Agitated  Mood Labile;Irritable;Sullen  Aggressive Behavior  Type of Behavior Verbal  Effect No apparent injury  Thought Process  Coherency WDL  Content Blaming others  Delusions None reported or observed  Perception Hallucinations  Hallucination Auditory  Judgment Impaired  Confusion Mild  Danger to Self  Current suicidal ideation? Denies  Danger to Others  Danger to Others None reported or observed  Danger to Others Abnormal  Harmful Behavior to others No threats or harm toward other people   Patient continues to be intrusive, loud agitated and verbally aggressive towards staff needs lots of redirection. Prn Zyprexa 10 mg given with little effect. Prn albuterol given at 2139 for wheezing.   Support and encouragement provided as needed. Q 15 minutes safety checks ongoing without self harm gestures. Will continue to monitor.

## 2020-10-21 NOTE — Plan of Care (Signed)
  Problem: Education: Goal: Emotional status will improve Outcome: Progressing Goal: Mental status will improve Outcome: Progressing   Problem: Coping: Goal: Coping ability will improve Outcome: Progressing   

## 2020-10-21 NOTE — BHH Counselor (Signed)
Patient repeatedly presents to nurses station demanding social worker act as a Neurosurgeon while he is on the phone with social security. It is unclear whether the patient is actually calling the social security office. Phones are turned off at this time as it is outside of scheduled phone hours. Patient is elevated and the writer is unable to understand the patient. Patient instructed to return to his room.   Signed:  Corky Crafts, MSW, [place], LCASA 10/21/2020 1:33 PM

## 2020-10-21 NOTE — Progress Notes (Signed)
This Clinical research associate has contacted [facility] ED to locate pt's belongings. Iva Lento contacted this Clinical research associate back stating they have looked and were unable to locate pt's belonging. This Clinical research associate contacted [employer] back and made him aware that this Clinical research associate saw a note in the chart on the 5th of June at 1436 written by Horton Chin stating the pt changed into scrubs and pt's belonging were placed in the cabinet. Iva Lento gave this Clinical research associate the number to [employer]. This Clinical research associate contacted her and made her aware of the note and asked if there was any luck locating the pt's belongings. Kennyth Arnold Toben stated she was going to get in touch with the RN that wrote the not to get more details. This Clinical research associate gave them my name the unit number to be able to contact me if anything was located. This Clinical research associate also contacted the sheriff's department to see if the pt's belongings traveled with the pt and was left behind. The sheriff's department reported no belonging traveled with the pt that [facility] ED reported them to be lost before transport and that was the reason to the pt's anger. This Clinical research associate will check for and update tomorrow.

## 2020-10-21 NOTE — Progress Notes (Signed)
Patient is pleasant this evening with much better mood. Blood sugar was 386 and received 5 units. Educated on food and the effects on blood sugar level, but was not receptive to teaching. Was med compliant and received meds without incident. Will continue to monitor with 15 minute safety checks and encouraged to seek staff with any concerns.    Dylan Butler-Nicholson, LPN

## 2020-10-22 LAB — GLUCOSE, CAPILLARY
Glucose-Capillary: 277 mg/dL — ABNORMAL HIGH (ref 70–99)
Glucose-Capillary: 195 mg/dL — ABNORMAL HIGH (ref 70–99)
Glucose-Capillary: 211 mg/dL — ABNORMAL HIGH (ref 70–99)
Glucose-Capillary: 297 mg/dL — ABNORMAL HIGH (ref 70–99)

## 2020-10-22 MED ORDER — CLINDAMYCIN HCL 150 MG PO CAPS
300.0000 mg | ORAL_CAPSULE | Freq: Four times a day (QID) | ORAL | Status: AC
  Administered 2020-10-22 – 2020-10-27 (×19): 300 mg via ORAL
  Filled 2020-10-22 (×19): qty 2

## 2020-10-22 MED ORDER — INSULIN GLARGINE 100 UNIT/ML ~~LOC~~ SOLN
30.0000 [IU] | Freq: Every day | SUBCUTANEOUS | Status: DC
  Administered 2020-10-23 – 2020-10-25 (×3): 30 [IU] via SUBCUTANEOUS
  Filled 2020-10-22 (×3): qty 0.3

## 2020-10-22 NOTE — Progress Notes (Signed)
D: Pt alert and oriented. Pt rates depression *010 and anxiety 10/10. Pt reports experiencing bilat leg pain, prn meds offered and refus. Pt denies experiencing any SI/HI, or AVH at this time.   Pt has had multiple outburst. Pt was confrontational with [name] at nurse's station attempting to get into nurse's station by sticking his leg in the door. When told to back up pt stated what are you going to do if I don't. Later on the pt was banging on the MD door yelling "You get double portions, I want double portions too!" Then when [name] group was in session this pt was asked to leave d/t behavior in the hallway prior to group. Pt became loud and confrontational again. This Clinical research associate, security and MHT has to assist pt in leaving group. Pt was given a room on the back hall where it was quiet and was give medication to help pt become calm and in control of his emotions. Pt did take a nap and wake up in a much better mood. Pt was apologetic for his behavior and rude language.   A: Scheduled medications administered to pt, per MD orders. Support and encouragement provided. Frequent verbal contact made. Routine safety checks conducted q15 minutes.   R: No adverse drug reactions noted. Pt verbally contracts for safety at this time. Pt interacts poorly with others on the unit. Pt remains safe at this time. Will continue to monitor.

## 2020-10-22 NOTE — Progress Notes (Signed)
Inpatient Diabetes Program Recommendations  AACE/ADA: New Consensus Statement on Inpatient Glycemic Control (2015)  Target Ranges:  Prepandial:   less than 140 mg/dL      Peak postprandial:   less than 180 mg/dL (1-2 hours)      Critically ill patients:  140 - 180 mg/dL   Lab Results  Component Value Date   GLUCAP 277 (H) 10/22/2020   HGBA1C 13.6 (H) 10/17/2020    Review of Glycemic Control Results for ANTHONI, GEERTS (MRN [id number]) as of 10/22/2020 08:21  Ref. Range 10/21/2020 11:31 10/21/2020 12:34 10/21/2020 16:18 10/21/2020 20:47 10/22/2020 07:40  Glucose-Capillary Latest Ref Range: 70 - 99 mg/dL 657 (H) 903 (H) 833 (H) 308 (H) 277 (H)    Inpatient Diabetes Program Recommendations:    Lantus 30 units QAM.  Will continue to follow while inpatient (0.3 units/kg).  Thank you, Dulce Sellar, RN, BSN Diabetes Coordinator Inpatient Diabetes Program [phone] (team pager from 8a-5p)

## 2020-10-22 NOTE — BHH Group Notes (Addendum)
LCSW Group Therapy Note  10/22/2020 2:10 PM  Type of Therapy and Topic:  Group Therapy:  Feelings around Relapse and Recovery  Participation Level:  Did Not Attend   Description of Group:    Patients in this group will discuss emotions they experience before and after a relapse. They will process how experiencing these feelings, or avoidance of experiencing them, relates to having a relapse. Facilitator will guide patients to explore emotions they have related to recovery. Patients will be encouraged to process which emotions are more powerful. They will be guided to discuss the emotional reaction significant others in their lives may have to their relapse or recovery. Patients will be assisted in exploring ways to respond to the emotions of others without this contributing to a relapse.  Therapeutic Goals: Patient will identify two or more emotions that lead to a relapse for them Patient will identify two emotions that result when they relapse Patient will identify two emotions related to recovery Patient will demonstrate ability to communicate their needs through discussion and/or role plays   Summary of Patient Progress: Patient not appropriate for group session at this time due to ongoing aggression and disruptive behaviors. Patient asked to leave after observed yelling in the hallway prior to entering the group room. Patient refused to leave, [name] requested assistance from MHT and security to remove the patient from group. Patient slammed fist on the table and refused to leave momentarily prior to leaving under his own accord. BMU staff will continue to monitor behaviors and revisit attendance at a later time.    Therapeutic Modalities:   Cognitive Behavioral Therapy Solution-Focused Therapy Assertiveness Training Relapse Prevention Therapy   Gwenevere Ghazi, MSW, [place], [place] 10/22/2020 2:10 PM

## 2020-10-22 NOTE — BHH Counselor (Addendum)
Patient has given [name] team written consent to contact [facility] strictly related to housing and income/SSI.  Patient has declined for [name] to arrange aftercare. [name] included [facility] contact information in AVS for patient to reach out upon discharge.   Signed:  Corky Crafts, MSW, [place], LCASA 10/22/2020 4:17 PM

## 2020-10-22 NOTE — BHH Counselor (Signed)
Patient repeatedly presents to the nurses station attempting to enter the nurses station. [name] instructed patient to back up out of the nurses station. Patient replied, "what if I don't" posturing in an aggressive manor. Patient eventually removed himself from the doorway enough for [name] to close door. Patient returned to his room.   Signed:  Corky Crafts, MSW, [place], LCASA 10/22/2020 2:22 PM

## 2020-10-22 NOTE — Progress Notes (Signed)
[facility]  10/22/2020 11:01 AM Dylan Jordan  MRN:  [id number]  CC " call Social Security and be my scribe."  Subjective:  65 year old male with schizoaffective disorder bipolar type presenting for disorganized behavior, elevated mood, auditory and visual hallucinations, suicidal ideations, and homicidal ideations. No acute events overnight, medication compliant aside from Haldol, ADLs impaired.  This morning patient seen one-on-one.  Patient continues to be quite angry and entitled today, but is less verbally aggressive than yesterday.  He continues to have unrealistic demands of getting his cataracts removed, tooth repaired, knee repaired, and housing prior to discharge.  He is unwilling to be into corrupted to explain the role of her hospital.  He continues to demand that we call Social Security and acted as his Neurosurgeonscribe.  He is also unwilling to listen to us when we inform him his ACT team has already made contact with Social Security on his behalf.  Principal Problem: Schizoaffective disorder, bipolar type (HCC) Diagnosis: Principal Problem:   Schizoaffective disorder, bipolar type (HCC) Active Problems:   HTN (hypertension)   Mild persistent asthma   Slow transit constipation   Uncontrolled type 2 diabetes mellitus with hyperglycemia (HCC)  Total Time spent with patient: 30 minutes  Past Psychiatric History: See H&P  Past Medical History:  Past Medical History:  Diagnosis Date   Anemia    Asthma    Diabetes mellitus without complication (HCC)    Gout    Hyperlipidemia    Hyperosmolar hyperglycemic coma due to diabetes mellitus without ketoacidosis (HCC)    Hypertension    Schizophrenia (HCC) 07/06/2018    Past Surgical History:  Procedure Laterality Date   REPLACEMENT TOTAL KNEE Left    TOE SURGERY     Family History:  Family History  Adopted: Yes  Problem Relation Age of Onset   Allergic rhinitis Neg Hx    Angioedema Neg Hx    Asthma Neg Hx    Atopy Neg  Hx    Eczema Neg Hx    Immunodeficiency Neg Hx    Urticaria Neg Hx    Family Psychiatric  History: See H&P Social History:  Social History   Substance and Sexual Activity  Alcohol Use No     Social History   Substance and Sexual Activity  Drug Use No    Social History   Socioeconomic History   Marital status: Single    Spouse name: Not on file   Number of children: Not on file   Years of education: Not on file   Highest education level: Not on file  Occupational History   Not on file  Tobacco Use   Smoking status: Some Days    Packs/day: 0.30    Years: 45.00    Pack years: 13.50    Types: Cigarettes    Start date: 05/16/1971   Smokeless tobacco: Never   Tobacco comments:    Decreased Intake to 10 cigarettes per week or less.  Vaping Use   Vaping Use: Never used  Substance and Sexual Activity   Alcohol use: No   Drug use: No   Sexual activity: Not on file  Other Topics Concern   Not on file  Social History Narrative   Lives by himself at home. Closest family member is niece.       ETOH none   Drugs non e   Current smoker, none for three days    Social Determinants of Health   Financial Resource Strain: Not on file  Food Insecurity: Not on file  Transportation Needs: Not on file  Physical Activity: Not on file  Stress: Not on file  Social Connections: Not on file   Additional Social History:                         Sleep: Fair  Appetite:  Fair  Current Medications: Current Facility-Administered Medications  Medication Dose Route Frequency Provider Last Rate Last Admin   acetaminophen (TYLENOL) tablet 650 mg  650 mg Oral Q6H PRN Clapacs, John T, MD       albuterol (VENTOLIN HFA) 108 (90 Base) MCG/ACT inhaler 1-2 puff  1-2 puff Inhalation Q4H PRN Jesse Sans, MD   2 puff at 10/22/20 0944   alum & mag hydroxide-simeth (MAALOX/MYLANTA) 200-200-20 MG/5ML suspension 30 mL  30 mL Oral Q4H PRN Clapacs, John T, MD       amLODipine (NORVASC)  tablet 10 mg  10 mg Oral Daily Clapacs, John T, MD   10 mg at 10/22/20 0752   atorvastatin (LIPITOR) tablet 40 mg  40 mg Oral QHS Jesse Sans, MD   40 mg at 10/21/20 2113   benztropine (COGENTIN) tablet 1 mg  1 mg Oral BID Clapacs, Jackquline Denmark, MD   1 mg at 10/22/20 0752   bisacodyl (DULCOLAX) EC tablet 5 mg  5 mg Oral Daily Jesse Sans, MD   5 mg at 10/22/20 0086   clindamycin (CLEOCIN) capsule 300 mg  300 mg Oral Q6H Jesse Sans, MD       desmopressin (DDAVP) tablet 0.2 mg  0.2 mg Oral QHS Clapacs, John T, MD   0.2 mg at 10/21/20 2110   divalproex (DEPAKOTE) DR tablet 1,000 mg  1,000 mg Oral Q12H Jesse Sans, MD   1,000 mg at 10/21/20 2100   ferrous sulfate tablet 325 mg  325 mg Oral QODAY Clapacs, Jackquline Denmark, MD   325 mg at 10/22/20 0944   gabapentin (NEURONTIN) capsule 600 mg  600 mg Oral TID Jesse Sans, MD   600 mg at 10/22/20 0752   insulin aspart (novoLOG) injection 0-15 Units  0-15 Units Subcutaneous TID WC Jesse Sans, MD   8 Units at 10/22/20 0744   insulin aspart (novoLOG) injection 0-5 Units  0-5 Units Subcutaneous QHS Jesse Sans, MD   4 Units at 10/21/20 2116   insulin aspart (novoLOG) injection 4 Units  4 Units Subcutaneous TID WC Jesse Sans, MD   4 Units at 10/22/20 0744   [START ON 10/23/2020] insulin glargine (LANTUS) injection 30 Units  30 Units Subcutaneous Daily Les Pou M, MD       lisinopril (ZESTRIL) tablet 15 mg  15 mg Oral Daily Jesse Sans, MD   15 mg at 10/22/20 0751   magnesium hydroxide (MILK OF MAGNESIA) suspension 30 mL  30 mL Oral Daily PRN Clapacs, Jackquline Denmark, MD       melatonin tablet 5 mg  5 mg Oral QHS Clapacs, John T, MD   5 mg at 10/21/20 2110   metFORMIN (GLUCOPHAGE-XR) 24 hr tablet 1,000 mg  1,000 mg Oral Q breakfast Clapacs, John T, MD   1,000 mg at 10/22/20 0751   nicotine polacrilex (NICORETTE) gum 2 mg  2 mg Oral Q4H PRN Jesse Sans, MD       OLANZapine [facility]) tablet 10 mg  10 mg Oral Q8H PRN Jesse Sans, MD   10 mg at  10/22/20 0850   paliperidone (INVEGA) 24 hr tablet 9 mg  9 mg Oral Daily Jesse Sans, MD   9 mg at 10/22/20 0752   pantoprazole (PROTONIX) EC tablet 40 mg  40 mg Oral Daily Clapacs, Jackquline Denmark, MD   40 mg at 10/22/20 0752   ziprasidone (GEODON) injection 20 mg  20 mg Intramuscular Q8H PRN Jesse Sans, MD        Lab Results:  Results for orders placed or performed during the hospital encounter of 10/20/20 (from the past 48 hour(s))  Glucose, capillary     Status: Abnormal   Collection Time: 10/20/20  4:11 PM  Result Value Ref Range   Glucose-Capillary 286 (H) 70 - 99 mg/dL    Comment: Glucose reference range applies only to samples taken after fasting for at least 8 hours.  Glucose, capillary     Status: Abnormal   Collection Time: 10/20/20  9:28 PM  Result Value Ref Range   Glucose-Capillary 386 (H) 70 - 99 mg/dL    Comment: Glucose reference range applies only to samples taken after fasting for at least 8 hours.  Lipid panel     Status: Abnormal   Collection Time: 10/21/20  6:37 AM  Result Value Ref Range   Cholesterol 206 (H) 0 - 200 mg/dL   Triglycerides 960 (H) <150 mg/dL   HDL 35 (L) >45 mg/dL   Total CHOL/HDL Ratio 5.9 RATIO   VLDL 73 (H) 0 - 40 mg/dL   LDL Cholesterol 98 0 - 99 mg/dL    Comment:        Total Cholesterol/HDL:CHD Risk Coronary Heart Disease Risk Table                     Men   Women  1/2 Average Risk   3.4   3.3  Average Risk       5.0   4.4  2 X Average Risk   9.6   7.1  3 X Average Risk  23.4   11.0        Use the calculated Patient Ratio above and the CHD Risk Table to determine the patient's CHD Risk.        ATP III CLASSIFICATION (LDL):  <100     mg/dL   Optimal  409-811  mg/dL   Near or Above                    Optimal  130-159  mg/dL   Borderline  914-782  mg/dL   High  >956     mg/dL   Very High Performed at [facility], [address] [place], [place] [address]   Valproic acid level     Status: Abnormal    Collection Time: 10/21/20  6:37 AM  Result Value Ref Range   Valproic Acid Lvl 39 (L) 50.0 - 100.0 ug/mL    Comment: Performed at [facility], [address] [place], [place] [address]  Glucose, capillary     Status: Abnormal   Collection Time: 10/21/20  6:45 AM  Result Value Ref Range   Glucose-Capillary 257 (H) 70 - 99 mg/dL    Comment: Glucose reference range applies only to samples taken after fasting for at least 8 hours.   Comment 1 Notify RN   Glucose, capillary     Status: Abnormal   Collection Time: 10/21/20 11:31 AM  Result Value Ref Range   Glucose-Capillary 407 (H) 70 -  99 mg/dL    Comment: Glucose reference range applies only to samples taken after fasting for at least 8 hours.  Glucose, capillary     Status: Abnormal   Collection Time: 10/21/20 12:34 PM  Result Value Ref Range   Glucose-Capillary 411 (H) 70 - 99 mg/dL    Comment: Glucose reference range applies only to samples taken after fasting for at least 8 hours.   Comment 1 Notify RN   Glucose, capillary     Status: Abnormal   Collection Time: 10/21/20  4:18 PM  Result Value Ref Range   Glucose-Capillary 172 (H) 70 - 99 mg/dL    Comment: Glucose reference range applies only to samples taken after fasting for at least 8 hours.   Comment 1 Notify RN   Glucose, capillary     Status: Abnormal   Collection Time: 10/21/20  8:47 PM  Result Value Ref Range   Glucose-Capillary 308 (H) 70 - 99 mg/dL    Comment: Glucose reference range applies only to samples taken after fasting for at least 8 hours.  Glucose, capillary     Status: Abnormal   Collection Time: 10/22/20  7:40 AM  Result Value Ref Range   Glucose-Capillary 277 (H) 70 - 99 mg/dL    Comment: Glucose reference range applies only to samples taken after fasting for at least 8 hours.    Blood Alcohol level:  Lab Results  Component Value Date   ETH <10 10/17/2020   ETH <10 10/16/2020    Metabolic Disorder Labs: Lab Results  Component  Value Date   HGBA1C 13.6 (H) 10/17/2020   MPG 344 10/17/2020   MPG 197.25 03/15/2020   No results found for: PROLACTIN Lab Results  Component Value Date   CHOL 206 (H) 10/21/2020   TRIG 367 (H) 10/21/2020   HDL 35 (L) 10/21/2020   CHOLHDL 5.9 10/21/2020   VLDL 73 (H) 10/21/2020   LDLCALC 98 10/21/2020   LDLCALC 77 03/18/2018    Physical Findings: AIMS: Facial and Oral Movements Muscles of Facial Expression: None, normal Lips and Perioral Area: None, normal Jaw: None, normal Tongue: None, normal,Extremity Movements Upper (arms, wrists, hands, fingers): None, normal Lower (legs, knees, ankles, toes): None, normal, Trunk Movements Neck, shoulders, hips: None, normal, Overall Severity Severity of abnormal movements (highest score from questions above): None, normal Incapacitation due to abnormal movements: None, normal Patient's awareness of abnormal movements (rate only patient's report): No Awareness, Dental Status Current problems with teeth and/or dentures?: Yes Does patient usually wear dentures?: No  CIWA:    COWS:     Musculoskeletal: Strength & Muscle Tone: within normal limits Gait & Station: normal Patient leans: N/A  Psychiatric Specialty Exam:  Presentation  General Appearance: Disheveled  Eye Contact:Other (comment) ([place], glaring)  Speech:Pressured; Slurred  Speech Volume:Increased  Handedness:Right   Mood and Affect  Mood:Angry; Irritable  Affect:Labile   Thought Process  Thought Processes:Disorganized  Descriptions of Associations:Intact  Orientation:Full (Time, Place and Person)  Thought Content:Obsessions; Delusions; Scattered  History of Schizophrenia/Schizoaffective disorder:Yes  Duration of Psychotic Symptoms:Greater than six months  Hallucinations:Denies Ideas of Reference:Paranoia; Percusatory; Delusions  Suicidal Thoughts:Denies Homicidal Thoughts:Denies  Sensorium  Memory:Immediate Poor; Recent Poor; Remote  Poor  Judgment:Impaired  Insight:Poor   Executive Functions  Concentration:Poor  Attention Span:Poor  Recall:Poor  Fund of Knowledge:Poor  Language:Poor   Psychomotor Activity  Psychomotor Activity:Psychomotor Activity: Increased   Assets  Assets:Desire for Improvement   Sleep  Sleep:Fair, 7.45   Physical Exam: Physical Exam ROS  Blood pressure 137/76, pulse (!) 107, temperature 98 F (36.7 C), temperature source Oral, resp. rate (!) 99, height  (1.93 m), weight 103.2 kg, SpO2 99 %. Body mass index is 27.69 kg/m.   Treatment Plan Summary: Daily contact with patient to assess and evaluate symptoms and progress in treatment and Medication management 1.  Schizoaffective disorder, bipolar type - Continue Depakote 1000 mg BID- increased 10/21/20 (VPA level 39 on 500 mg TID), -Discontinue Haldol has patient unwilling to take this medication -Start Invega 9 mg nightly as patient states he is willing to take this medication.  We will try to encourage him to transition to long-acting injectable if no side effects noted -Continue Cogentin 1 mg twice a day for EPS prophylaxis   2.  Hypertension - Continue amlodipine 10 mg daily, lisinopril 15 mg daily   3.  Hyperlipidemia - Continue Lipitor 40 mg daily at bedtime   4.  Constipation - Dulcolax 5 mg daily   5.  Diabetes mellitus, uncontrolled - Appreciate input from diabetic coordinator.  Continue metformin 1000 mg daily, increase Lantus 30  mg daily.  NovoLog 4 units 3 times daily with meals in addition to correctional insulin with meals and bedtime.   Hemoglobin A1c 13.6 at outside hospital.  Continue gabapentin 600 mg 3 times a day for neuropathic pain   6.  Tobacco use disorder -Nicorette gum   7.  GERD -Protonix 40 mg daily  10/22/20: Psychiatric exam above reviewed and remains accurate. Assessment and plan above reviewed and updated.   Jesse Sans, MD 10/22/2020, 11:01 AM

## 2020-10-22 NOTE — Tx Team (Addendum)
Interdisciplinary Treatment and Diagnostic Plan Update  10/22/2020 Time of Session: 9:00AM Dylan Jordan MRN: [id number]  Principal Diagnosis: Schizoaffective disorder, bipolar type [facility] [place])  Secondary Diagnoses: Principal Problem:   Schizoaffective disorder, bipolar type ([place]) Active Problems:   HTN (hypertension)   Mild persistent asthma   Slow transit constipation   Uncontrolled type 2 diabetes mellitus with hyperglycemia ([place])   Current Medications:  Current Facility-Administered Medications  Medication Dose Route Frequency Provider Last Rate Last Admin   acetaminophen (TYLENOL) tablet 650 mg  650 mg Oral Q6H PRN Clapacs, John T, MD       albuterol (VENTOLIN HFA) 108 (90 Base) MCG/ACT inhaler 1-2 puff  1-2 puff Inhalation Q4H PRN Salley Scarlet, MD   2 puff at 10/21/20 2139   alum & mag hydroxide-simeth (MAALOX/MYLANTA) 200-200-20 MG/5ML suspension 30 mL  30 mL Oral Q4H PRN Clapacs, John T, MD       amLODipine (NORVASC) tablet 10 mg  10 mg Oral Daily Clapacs, John T, MD   10 mg at 10/22/20 0752   atorvastatin (LIPITOR) tablet 40 mg  40 mg Oral QHS Salley Scarlet, MD   40 mg at 10/21/20 2113   benztropine (COGENTIN) tablet 1 mg  1 mg Oral BID Clapacs, Madie Reno, MD   1 mg at 10/22/20 0752   bisacodyl (DULCOLAX) EC tablet 5 mg  5 mg Oral Daily Salley Scarlet, MD   5 mg at 10/22/20 7672   desmopressin (DDAVP) tablet 0.2 mg  0.2 mg Oral QHS Clapacs, John T, MD   0.2 mg at 10/21/20 2110   divalproex (DEPAKOTE) DR tablet 1,000 mg  1,000 mg Oral Q12H Salley Scarlet, MD   1,000 mg at 10/21/20 2100   ferrous sulfate tablet 325 mg  325 mg Oral QODAY Clapacs, John T, MD       gabapentin (NEURONTIN) capsule 600 mg  600 mg Oral TID Salley Scarlet, MD   600 mg at 10/22/20 0752   insulin aspart (novoLOG) injection 0-15 Units  0-15 Units Subcutaneous TID WC Salley Scarlet, MD   8 Units at 10/22/20 0744   insulin aspart (novoLOG) injection 0-5 Units  0-5 Units Subcutaneous QHS Salley Scarlet, MD   4 Units at 10/21/20 2116   insulin aspart (novoLOG) injection 4 Units  4 Units Subcutaneous TID WC Salley Scarlet, MD   4 Units at 10/22/20 0744   insulin glargine (LANTUS) injection 25 Units  25 Units Subcutaneous Daily Salley Scarlet, MD   25 Units at 10/22/20 0832   lisinopril (ZESTRIL) tablet 15 mg  15 mg Oral Daily Salley Scarlet, MD   15 mg at 10/22/20 0751   magnesium hydroxide (MILK OF MAGNESIA) suspension 30 mL  30 mL Oral Daily PRN Clapacs, Madie Reno, MD       melatonin tablet 5 mg  5 mg Oral QHS Clapacs, John T, MD   5 mg at 10/21/20 2110   metFORMIN (GLUCOPHAGE-XR) 24 hr tablet 1,000 mg  1,000 mg Oral Q breakfast Clapacs, John T, MD   1,000 mg at 10/22/20 0751   nicotine polacrilex (NICORETTE) gum 2 mg  2 mg Oral Q4H PRN Salley Scarlet, MD       OLANZapine [facility]) tablet 10 mg  10 mg Oral Q8H PRN Salley Scarlet, MD   10 mg at 10/22/20 0850   paliperidone (INVEGA) 24 hr tablet 9 mg  9 mg Oral Daily Salley Scarlet, MD   9  mg at 10/22/20 0752   pantoprazole (PROTONIX) EC tablet 40 mg  40 mg Oral Daily Clapacs, Madie Reno, MD   40 mg at 10/22/20 1194   ziprasidone (GEODON) injection 20 mg  20 mg Intramuscular Q8H PRN Salley Scarlet, MD       PTA Medications: Medications Prior to Admission  Medication Sig Dispense Refill Last Dose   albuterol (PROVENTIL HFA;VENTOLIN HFA) 108 (90 Base) MCG/ACT inhaler Inhale 1-2 puffs into the lungs every 6 (six) hours as needed for wheezing or shortness of breath. (Patient not taking: Reported on 10/17/2020) 1 Inhaler 2    amLODipine (NORVASC) 10 MG tablet Take 10 mg by mouth daily. (Patient not taking: Reported on 10/17/2020)      benztropine (COGENTIN) 2 MG tablet Take 1 tablet (2 mg total) by mouth 2 (two) times daily. (Patient not taking: No sig reported) 30 tablet 0    desmopressin (DDAVP) 0.2 MG tablet Take 0.2 mg by mouth at bedtime. (Patient not taking: Reported on 10/17/2020)      divalproex (DEPAKOTE) 500 MG DR tablet Take 1 tablet  (500 mg total) by mouth every 8 (eight) hours. (Patient not taking: No sig reported) 90 tablet 0    ferrous sulfate 325 (65 FE) MG EC tablet Take 1 tablet (325 mg total) by mouth every other day. (Patient not taking: Reported on 10/17/2020) 45 tablet 0    gabapentin (NEURONTIN) 600 MG tablet Take 600 mg by mouth 2 (two) times daily. (Patient not taking: No sig reported)      Insulin Glargine (LANTUS SOLOSTAR) 100 UNIT/ML Solostar Pen Inject 35 Units into the skin daily. (Patient not taking: Reported on 10/17/2020) 15 mL 11    insulin lispro (HUMALOG) 100 UNIT/ML injection Inject 0-10 Units into the skin in the morning and at bedtime. Glucose 70-199, 0 units 200-249, 2 units 250-299, 4 units 300-349, 6 units 350-399, 8 units 400-449, 10 units > 450 Call MD (Patient not taking: Reported on 10/17/2020)      melatonin 5 MG TABS Take 5 mg by mouth at bedtime. (Patient not taking: Reported on 10/17/2020)      metformin (FORTAMET) 1000 MG (OSM) 24 hr tablet Take 1 tablet (1,000 mg total) by mouth daily. (Patient not taking: Reported on 10/17/2020) 30 tablet 0    OLANZapine (ZYPREXA) 5 MG tablet Take 1 tablet (5 mg total) by mouth 3 (three) times daily before meals. (Patient not taking: Reported on 10/17/2020) 30 tablet 0    omeprazole (PRILOSEC) 20 MG capsule Take 20 mg by mouth 2 (two) times daily before a meal. (Patient not taking: Reported on 10/17/2020)      polyethylene glycol (MIRALAX / GLYCOLAX) 17 g packet Take 17 g by mouth daily as needed. (Patient not taking: No sig reported) 14 each 0     Patient Stressors:    Patient Strengths:    Treatment Modalities: Medication Management, Group therapy, Case management,  1 to 1 session with clinician, Psychoeducation, Recreational therapy.   Physician Treatment Plan for Primary Diagnosis: Schizoaffective disorder, bipolar type ([place]) Long Term Goal(s): Improvement in symptoms so as ready for discharge Improvement in symptoms so as ready for discharge   Short  Term Goals: Ability to identify changes in lifestyle to reduce recurrence of condition will improve Ability to verbalize feelings will improve Ability to disclose and discuss suicidal ideas Ability to demonstrate self-control will improve Ability to identify and develop effective coping behaviors will improve Ability to maintain clinical measurements within normal limits  will improve Compliance with prescribed medications will improve Ability to identify changes in lifestyle to reduce recurrence of condition will improve Ability to verbalize feelings will improve Ability to disclose and discuss suicidal ideas Ability to demonstrate self-control will improve Ability to identify and develop effective coping behaviors will improve Ability to maintain clinical measurements within normal limits will improve Compliance with prescribed medications will improve  Medication Management: Evaluate patient's response, side effects, and tolerance of medication regimen.  Therapeutic Interventions: 1 to 1 sessions, Unit Group sessions and Medication administration.  Evaluation of Outcomes: Not Met  Physician Treatment Plan for Secondary Diagnosis: Principal Problem:   Schizoaffective disorder, bipolar type ([place]) Active Problems:   HTN (hypertension)   Mild persistent asthma   Slow transit constipation   Uncontrolled type 2 diabetes mellitus with hyperglycemia ([place])  Long Term Goal(s): Improvement in symptoms so as ready for discharge Improvement in symptoms so as ready for discharge   Short Term Goals: Ability to identify changes in lifestyle to reduce recurrence of condition will improve Ability to verbalize feelings will improve Ability to disclose and discuss suicidal ideas Ability to demonstrate self-control will improve Ability to identify and develop effective coping behaviors will improve Ability to maintain clinical measurements within normal limits will improve Compliance with  prescribed medications will improve Ability to identify changes in lifestyle to reduce recurrence of condition will improve Ability to verbalize feelings will improve Ability to disclose and discuss suicidal ideas Ability to demonstrate self-control will improve Ability to identify and develop effective coping behaviors will improve Ability to maintain clinical measurements within normal limits will improve Compliance with prescribed medications will improve     Medication Management: Evaluate patient's response, side effects, and tolerance of medication regimen.  Therapeutic Interventions: 1 to 1 sessions, Unit Group sessions and Medication administration.  Evaluation of Outcomes: Not Met   RN Treatment Plan for Primary Diagnosis: Schizoaffective disorder, bipolar type ([place]) Long Term Goal(s): Knowledge of disease and therapeutic regimen to maintain health will improve  Short Term Goals: Ability to remain free from injury will improve, Ability to verbalize frustration and anger appropriately will improve, Ability to demonstrate self-control, Ability to participate in decision making will improve, Ability to verbalize feelings will improve, Ability to disclose and discuss suicidal ideas, Ability to identify and develop effective coping behaviors will improve, and Compliance with prescribed medications will improve  Medication Management: RN will administer medications as ordered by provider, will assess and evaluate patient's response and provide education to patient for prescribed medication. RN will report any adverse and/or side effects to prescribing provider.  Therapeutic Interventions: 1 on 1 counseling sessions, Psychoeducation, Medication administration, Evaluate responses to treatment, Monitor vital signs and CBGs as ordered, Perform/monitor CIWA, COWS, AIMS and Fall Risk screenings as ordered, Perform wound care treatments as ordered.  Evaluation of Outcomes: Not Met   LCSW  Treatment Plan for Primary Diagnosis: Schizoaffective disorder, bipolar type ([place]) Long Term Goal(s): Safe transition to appropriate next level of care at discharge, Engage patient in therapeutic group addressing interpersonal concerns.  Short Term Goals: Engage patient in aftercare planning with referrals and resources, Increase social support, Increase ability to appropriately verbalize feelings, Increase emotional regulation, Facilitate acceptance of mental health diagnosis and concerns, Facilitate patient progression through stages of change regarding substance use diagnoses and concerns, Identify triggers associated with mental health/substance abuse issues, and Increase skills for wellness and recovery  Therapeutic Interventions: Assess for all discharge needs, 1 to 1 time with Social worker, Explore available  resources and support systems, Assess for adequacy in community support network, Educate family and significant other(s) on suicide prevention, Complete Psychosocial Assessment, Interpersonal group therapy.  Evaluation of Outcomes: Not Met   Progress in Treatment: Attending groups: Yes. Participating in groups: No. Taking medication as prescribed: No. Toleration medication: Yes. Family/Significant other contact made: No, will contact:  when given permission Patient understands diagnosis: No. Discussing patient identified problems/goals with staff: Yes. Medical problems stabilized or resolved: No. Denies suicidal/homicidal ideation: Yes. Issues/concerns per patient self-inventory: No. Other: None  New problem(s) identified: No, Describe:  None  New Short Term/Long Term Goal(s): detox, elimination of symptoms of psychosis, medication management for mood stabilization; elimination of SI thoughts; development of comprehensive mental wellness/sobriety plan.   Patient Goals:  "I want my teeth and knees fixed...housing, adjustments to my social security and funding, and cataracts  removed"  Discharge Plan or Barriers: [name] will assist pt with appropriate discharge plan.  Reason for Continuation of Hospitalization: Aggression Delusions  Hallucinations [place] Medical Issues Medication stabilization  Estimated Length of Stay: 1- 7 days  Recreational Therapy: Patient Stressors: N/A Patient Goal: Patient will engage in groups without prompting or encouragement from LRT x3 group sessions within 5 recreation therapy group sessions.  Attendees: Patient: Dylan Starace" Jordan 10/22/2020 9:01 AM  Physician: Salley Scarlet, MD  10/22/2020 9:01 AM  Nursing: Marla Roe, RN  10/22/2020 9:01 AM  RN Care Manager: 10/22/2020 9:01 AM  Social Worker: Hedy Camara "Robbie" [place], MSW, LCSW, LCAS  10/22/2020 9:01 AM  Recreational Therapist:  10/22/2020 9:01 AM  Other: Kiva [place], MSW, LCSW-A  10/22/2020 9:01 AM  Other: Paulla Dolly, LCSW-A,LCAS-A  10/22/2020 9:01 AM  Other: 10/22/2020 9:01 AM    Scribe for Treatment Team: Kiva A [place], [place] 10/22/2020 9:01 AM

## 2020-10-22 NOTE — [facility] Suicide Risk Assessment (Signed)
[facility] INPATIENT:  Family/Significant Other Suicide Prevention Education  Suicide Prevention Education:  Patient Refusal for Family/Significant Other Suicide Prevention Education: The patient Dylan Jordan has refused to provide written consent for family/significant other to be provided Family/Significant Other Suicide Prevention Education during admission and/or prior to discharge.  Physician notified.  Corky Crafts 10/22/2020, 4:16 PM

## 2020-10-22 NOTE — Progress Notes (Signed)
Recreation Therapy Notes  Date: 10/22/2020   Time: 9:30 am   Location: Courtyard      Behavioral response: Appropriate   Intervention Topic:  Social- Skills    Discussion/Intervention:  Group content on today was focused on Pharmacist, community. The group defined social skills and identified ways they use social skills. Patients expressed what obstacles they face when trying to be social. Participants described the importance of social skills. The group listed ways to improve social skills and reasons to improve social skills. Individuals had an opportunity to learn new and improve social skills as well as identify their weaknesses. Clinical Observations/Feedback: Patient came to group late and was able to participate in group. Individual was social with staff and peers while participating in the intervention. Participant left group early due to unknown reasons.  [name] LRT/CTRS           Molly Savarino 10/22/2020 11:18 AM

## 2020-10-22 NOTE — [facility] Counselor (Signed)
Adult Comprehensive Assessment  Patient ID: Dylan Jordan, male   DOB: 1955-05-22, 65 y.o.   MRN: [id number]  Information Source: Information source: Patient  Current Stressors:  Patient states their primary concerns and needs for treatment are:: Patient shows limited incite related to reason for hospitalization. Reports that "knowlege and inteligence do not agree." Remains tangetial and responses are loosely associated with questions. Patient states their goals for this hospitilization and ongoing recovery are:: Reports his goal is to leave the hospital. 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 Physical health (include injuries & life threatening diseases): Patient reports concerns about his penis, though when asked if he needs medical attention, he replied that "only a wife can help" with his problem. [name] assumes patient is referring to sexual dysfuction related to medication side-effects. Social relationships: none reported Substance abuse: none reported Bereavement / Loss: none reported  Living/Environment/Situation:  Living Arrangements: Alone Living conditions (as described by patient or guardian): Paitent provided written concent for [name] to discuss housing with [facility] representative who describes his housing situation as unsteady due to patient being disruptive and banned from all placements. Who else lives in the home?: N/A, patient is homeless How long has patient lived in current situation?: unknown What is atmosphere in current home: Temporary  Family History:  Marital status: Single Are you sexually active?: No What is your sexual orientation?:  unknown Has your sexual activity been affected by drugs, alcohol, medication, or emotional stress?: unknown Does patient have children?: No  Childhood History:  By whom was/is the patient raised?:  (Patient reffuses to answer, repeating himself saying "i do not know." However, patient denies memory impairment.) Additional childhood history information: Patient refuses to answer Description of patient's relationship with caregiver when they were a child: Patient refuses to answer Patient's description of current relationship with people who raised him/her: Patient refuses to answer How were you disciplined when you got in trouble as a child/adolescent?: Patient refuses to answer Does patient have siblings?: Yes Number of Siblings: 1 Description of patient's current relationship with siblings: [name] aware of 1 sister whom he calls while on the unit. Did patient suffer any verbal/emotional/physical/sexual abuse as a child?:  (unknown) Did patient suffer from severe childhood neglect?:  (unknown) Has patient ever been sexually abused/assaulted/raped as an adolescent or adult?:  (unknown) Was the patient ever a victim of a crime or a disaster?:  (unknown) Witnessed domestic violence?:  (unknown) Has patient been affected by domestic violence as an adult?:  (unknown)  Education:  Highest grade of school patient has completed: unknown, per observation of documents written by patient, it appears his literacy is sufficient to document short lists, albeit severely limited. Currently a student?: No Learning disability?: Yes What learning problems does patient have?: [name] suspects patient is intelectually disabled, though unaware if patient has undergone formal testing.  Employment/Work Situation:   Employment Situation: Unemployed (it is unclear if patient is currently on SSDI) Patient's Job has Been Impacted by Current Illness: Yes (Patient has difficulty following simple instructions, becomes easily  agitated, and often uses agression to achieve needs.) Has Patient ever Been in the [employer]?: No  Financial Resources:   Financial resources: Medicaid Does patient have a Lawyer or guardian?:  No (Per [facility], patient does not have a guardian.)  Alcohol/Substance Abuse:   What has been your use of drugs/alcohol within the last 12 months?: Patient denies If attempted suicide, did drugs/alcohol play a role in this?: No Alcohol/Substance Abuse Treatment Hx: Denies past history Has alcohol/substance abuse ever caused legal problems?: No  Social Support System:   Patient's Community Support System: None Describe Community Support System: Patient reports that he is isolated. Type of faith/religion: Islam How does patient's faith help to cope with current illness?: Patient is observed praying while on the unit.  Leisure/Recreation:   Do You Have Hobbies?: No  Strengths/Needs:   What is the patient's perception of their strengths?: unable to assess Patient states they can use these personal strengths during their treatment to contribute to their recovery: unable to assess Patient states these barriers may affect/interfere with their treatment: unable to assess Patient states these barriers may affect their return to the community: Patient reports that he is homeless. Other important information patient would like considered in planning for their treatment: Patient has become fixated on calling the social security office. [name] discuss matter with [facility] who reports that the patient application has been submitted and in process. No further action required.  Discharge Plan:   Currently receiving community mental health services: Yes (From Whom) Patient states concerns and preferences for aftercare planning are: Patient sees Dr. Jeannine Kitten with [facility] for medication management. Patient states they will know when they are safe and ready for discharge when: Patient believes he  is ready for discharge. Does patient have access to transportation?: No Does patient have financial barriers related to discharge medications?: No Patient description of barriers related to discharge medications: [facility]. Plan for no access to transportation at discharge: [name] to assist with transportation at discharge. Plan for living situation after discharge: unknown, [name] to continue to follow. Will patient be returning to same living situation after discharge?: No  Summary/Recommendations:   Summary and Recommendations (to be completed by the evaluator): Patient presents to the hospital with limited incite related to reason for hospitalization. Per chart review and discussions with [facility] services, Patient was admitted after multiple ED visits related to auditory and visual halucinations, agressive behaviors, and disorganized thoughts pertaining to extensive psychosocial issues (social security, homelessness, etc.). [facility] services report that the patient has been banned from all housing placements due to baseline disorganized and agresive behaviors. Recomendations include crisis stabilization, comprehensive wellness plan, encouraged group therapy when appropriate, and medication management.  Corky Crafts. 10/22/2020

## 2020-10-22 NOTE — Progress Notes (Signed)
PT Cancellation Note  Patient Details Name: Dylan Jordan MRN: [id number] DOB: [birth year]   Cancelled Treatment:    Reason Eval/Treat Not Completed: Other (comment). Per RN, pt with recent medication administration, pt is resting. PT to re-attempt as able.    Olga Coaster PT, DPT 2:42 PM,10/22/20

## 2020-10-22 NOTE — Progress Notes (Signed)
Recreation Therapy Notes  INPATIENT RECREATION THERAPY ASSESSMENT  Patient Details Name: Dylan Jordan MRN: [id number] DOB: 1955-08-12 Today's Date: 10/22/2020       Information Obtained From: Patient (Patient unable to participate in assessment due to behavior)  Able to Participate in Assessment/Interview:    Patient Presentation:    Reason for Admission (Per Patient):    Patient Stressors:    Coping Skills:      Leisure Interests (2+):     Frequency of Recreation/Participation:    Awareness of Community Resources:     [employer]:     Current Use:    If no, Barriers?:    Expressed Interest in [employer] Information:    [place] of Residence:     Patient Main Form of Transportation:    Patient Strengths:     Patient Identified Areas of Improvement:     Patient Goal for Hospitalization:     Current SI (including self-harm):     Current HI:     Current AVH:    Staff Intervention Plan:    Consent to Intern Participation:    Dylan Jordan 10/22/2020, 1:50 PM

## 2020-10-23 LAB — GLUCOSE, CAPILLARY
Glucose-Capillary: 229 mg/dL — ABNORMAL HIGH (ref 70–99)
Glucose-Capillary: 264 mg/dL — ABNORMAL HIGH (ref 70–99)
Glucose-Capillary: 132 mg/dL — ABNORMAL HIGH (ref 70–99)
Glucose-Capillary: 112 mg/dL — ABNORMAL HIGH (ref 70–99)

## 2020-10-23 MED ORDER — DIPHENHYDRAMINE HCL 25 MG PO CAPS
25.0000 mg | ORAL_CAPSULE | Freq: Four times a day (QID) | ORAL | Status: DC | PRN
  Administered 2020-10-23 – 2020-10-26 (×5): 25 mg via ORAL
  Filled 2020-10-23 (×5): qty 1

## 2020-10-23 MED ORDER — TRAZODONE HCL 50 MG PO TABS
50.0000 mg | ORAL_TABLET | Freq: Every day | ORAL | Status: DC
  Administered 2020-10-23: 50 mg via ORAL
  Filled 2020-10-23: qty 1

## 2020-10-23 NOTE — Plan of Care (Signed)
  Problem: Education: Goal: Emotional status will improve Outcome: Not Progressing Goal: Mental status will improve Outcome: Not Progressing Goal: Verbalization of understanding the information provided will improve Outcome: Not Progressing   

## 2020-10-23 NOTE — [facility] Group Notes (Signed)
[facility] Group Notes: (Clinical Social Work)   10/23/2020      Type of Therapy:  Group Therapy   Participation Level:  Did Not Attend - was invited individually by Nurse/MHT and chose not to attend. Patient unable to attend due to recent behaviors on the unit.    Susa Simmonds, LCSWA 10/23/2020  2:23 PM

## 2020-10-23 NOTE — Progress Notes (Signed)
Patient is restless and at the back door multiple times within the hour. He continues to say that he did not bang on the doctor's doors yesterday.

## 2020-10-23 NOTE — Progress Notes (Signed)
[facility]  10/23/2020 2:33 PM Dylan Jordan  MRN:  [id number]  CC " I dont have any problem, I dont need to be here"  Subjective:  65 year old male with schizoaffective disorder bipolar type presenting for disorganized behavior, elevated mood, auditory and visual hallucinations, suicidal ideations, and homicidal ideations. No acute events overnight, medication compliant aside from Haldol, ADLs impaired. Per nursing- slept 4.45 hrsPatient has labile mood. Demanding (2) diet colas before agreeing to take QHS meds. He denies SI/HI/AVH depression and anxiety at this encounter. He refused to take his Depakote stating that he is not willing to take Haldol or Depakote, but will take Zyprexa. He is a argumentative on every subject discussed with him. He will continue to be monitored with Q 15 minute safety checks.   Pt seen in his room, pt continue to be labile, irritable, hyper verbal, tangential speech, demanding to release him, states he will only take " two medications" , pt unable discuss his concerns in rational way and unable to discuss alternatives, pt argumentative and loud, poor insight,   Principal Problem: Schizoaffective disorder, bipolar type (HCC) Diagnosis: Principal Problem:   Schizoaffective disorder, bipolar type (HCC) Active Problems:   HTN (hypertension)   Mild persistent asthma   Slow transit constipation   Uncontrolled type 2 diabetes mellitus with hyperglycemia (HCC)  Total Time spent with patient: 25 min  Past Psychiatric History: See H&P  Past Medical History:  Past Medical History:  Diagnosis Date   Anemia    Asthma    Diabetes mellitus without complication (HCC)    Gout    Hyperlipidemia    Hyperosmolar hyperglycemic coma due to diabetes mellitus without ketoacidosis (HCC)    Hypertension    Schizophrenia (HCC) 07/06/2018    Past Surgical History:  Procedure Laterality Date   REPLACEMENT TOTAL KNEE Left    TOE SURGERY     Family History:  Family  History  Adopted: Yes  Problem Relation Age of Onset   Allergic rhinitis Neg Hx    Angioedema Neg Hx    Asthma Neg Hx    Atopy Neg Hx    Eczema Neg Hx    Immunodeficiency Neg Hx    Urticaria Neg Hx    Family Psychiatric  History: See H&P Social History:  Social History   Substance and Sexual Activity  Alcohol Use No     Social History   Substance and Sexual Activity  Drug Use No    Social History   Socioeconomic History   Marital status: Single    Spouse name: Not on file   Number of children: Not on file   Years of education: Not on file   Highest education level: Not on file  Occupational History   Not on file  Tobacco Use   Smoking status: Some Days    Packs/day: 0.30    Years: 45.00    Pack years: 13.50    Types: Cigarettes    Start date: 05/16/1971   Smokeless tobacco: Never   Tobacco comments:    Decreased Intake to 10 cigarettes per week or less.  Vaping Use   Vaping Use: Never used  Substance and Sexual Activity   Alcohol use: No   Drug use: No   Sexual activity: Not on file  Other Topics Concern   Not on file  Social History Narrative   Lives by himself at home. Closest family member is niece.       ETOH none   Drugs non  e   Current smoker, none for three days    Social Determinants of Health   Financial Resource Strain: Not on file  Food Insecurity: Not on file  Transportation Needs: Not on file  Physical Activity: Not on file  Stress: Not on file  Social Connections: Not on file   Additional Social History:                         Sleep: Fair  Appetite:  Fair  Current Medications: Current Facility-Administered Medications  Medication Dose Route Frequency Provider Last Rate Last Admin   acetaminophen (TYLENOL) tablet 650 mg  650 mg Oral Q6H PRN Clapacs, John T, MD       albuterol (VENTOLIN HFA) 108 (90 Base) MCG/ACT inhaler 1-2 puff  1-2 puff Inhalation Q4H PRN Jesse Sans, MD   2 puff at 10/23/20 0814   alum & mag  hydroxide-simeth (MAALOX/MYLANTA) 200-200-20 MG/5ML suspension 30 mL  30 mL Oral Q4H PRN Clapacs, John T, MD       amLODipine (NORVASC) tablet 10 mg  10 mg Oral Daily Clapacs, John T, MD   10 mg at 10/23/20 0809   atorvastatin (LIPITOR) tablet 40 mg  40 mg Oral QHS Jesse Sans, MD   40 mg at 10/22/20 2138   benztropine (COGENTIN) tablet 1 mg  1 mg Oral BID Clapacs, Jackquline Denmark, MD   1 mg at 10/23/20 0809   bisacodyl (DULCOLAX) EC tablet 5 mg  5 mg Oral Daily Jesse Sans, MD   5 mg at 10/23/20 0809   clindamycin (CLEOCIN) capsule 300 mg  300 mg Oral Q6H Jesse Sans, MD   300 mg at 10/23/20 1017   desmopressin (DDAVP) tablet 0.2 mg  0.2 mg Oral QHS Clapacs, John T, MD   0.2 mg at 10/22/20 2140   divalproex (DEPAKOTE) DR tablet 1,000 mg  1,000 mg Oral Q12H Jesse Sans, MD   1,000 mg at 10/22/20 2139   ferrous sulfate tablet 325 mg  325 mg Oral QODAY Clapacs, Jackquline Denmark, MD   325 mg at 10/22/20 0944   gabapentin (NEURONTIN) capsule 600 mg  600 mg Oral TID Jesse Sans, MD   600 mg at 10/23/20 1131   insulin aspart (novoLOG) injection 0-15 Units  0-15 Units Subcutaneous TID WC Jesse Sans, MD   8 Units at 10/23/20 1133   insulin aspart (novoLOG) injection 0-5 Units  0-5 Units Subcutaneous QHS Jesse Sans, MD   3 Units at 10/22/20 2139   insulin aspart (novoLOG) injection 4 Units  4 Units Subcutaneous TID WC Jesse Sans, MD   4 Units at 10/23/20 1133   insulin glargine (LANTUS) injection 30 Units  30 Units Subcutaneous Daily Jesse Sans, MD   30 Units at 10/23/20 0808   lisinopril (ZESTRIL) tablet 15 mg  15 mg Oral Daily Jesse Sans, MD   15 mg at 10/23/20 0809   magnesium hydroxide (MILK OF MAGNESIA) suspension 30 mL  30 mL Oral Daily PRN Clapacs, Jackquline Denmark, MD       melatonin tablet 5 mg  5 mg Oral QHS Clapacs, John T, MD   5 mg at 10/22/20 2139   metFORMIN (GLUCOPHAGE-XR) 24 hr tablet 1,000 mg  1,000 mg Oral Q breakfast Clapacs, Jackquline Denmark, MD   1,000 mg at 10/23/20  0809   nicotine polacrilex (NICORETTE) gum 2 mg  2 mg Oral Q4H PRN Les Pou  M, MD       OLANZapine (ZYPREXA) tablet 10 mg  10 mg Oral Q8H PRN Jesse Sans, MD   10 mg at 10/23/20 0827   paliperidone (INVEGA) 24 hr tablet 9 mg  9 mg Oral Daily Jesse Sans, MD   9 mg at 10/23/20 0809   pantoprazole (PROTONIX) EC tablet 40 mg  40 mg Oral Daily Clapacs, Jackquline Denmark, MD   40 mg at 10/23/20 0809   ziprasidone (GEODON) injection 20 mg  20 mg Intramuscular Q8H PRN Jesse Sans, MD   20 mg at 10/22/20 1324    Lab Results:  Results for orders placed or performed during the hospital encounter of 10/20/20 (from the past 48 hour(s))  Glucose, capillary     Status: Abnormal   Collection Time: 10/21/20  4:18 PM  Result Value Ref Range   Glucose-Capillary 172 (H) 70 - 99 mg/dL    Comment: Glucose reference range applies only to samples taken after fasting for at least 8 hours.   Comment 1 Notify RN   Glucose, capillary     Status: Abnormal   Collection Time: 10/21/20  8:47 PM  Result Value Ref Range   Glucose-Capillary 308 (H) 70 - 99 mg/dL    Comment: Glucose reference range applies only to samples taken after fasting for at least 8 hours.  Glucose, capillary     Status: Abnormal   Collection Time: 10/22/20  7:40 AM  Result Value Ref Range   Glucose-Capillary 277 (H) 70 - 99 mg/dL    Comment: Glucose reference range applies only to samples taken after fasting for at least 8 hours.  Glucose, capillary     Status: Abnormal   Collection Time: 10/22/20 11:24 AM  Result Value Ref Range   Glucose-Capillary 195 (H) 70 - 99 mg/dL    Comment: Glucose reference range applies only to samples taken after fasting for at least 8 hours.  Glucose, capillary     Status: Abnormal   Collection Time: 10/22/20  4:22 PM  Result Value Ref Range   Glucose-Capillary 211 (H) 70 - 99 mg/dL    Comment: Glucose reference range applies only to samples taken after fasting for at least 8 hours.  Glucose,  capillary     Status: Abnormal   Collection Time: 10/22/20  8:09 PM  Result Value Ref Range   Glucose-Capillary 297 (H) 70 - 99 mg/dL    Comment: Glucose reference range applies only to samples taken after fasting for at least 8 hours.   Comment 1 Notify RN   Glucose, capillary     Status: Abnormal   Collection Time: 10/23/20  7:31 AM  Result Value Ref Range   Glucose-Capillary 229 (H) 70 - 99 mg/dL    Comment: Glucose reference range applies only to samples taken after fasting for at least 8 hours.  Glucose, capillary     Status: Abnormal   Collection Time: 10/23/20 11:29 AM  Result Value Ref Range   Glucose-Capillary 264 (H) 70 - 99 mg/dL    Comment: Glucose reference range applies only to samples taken after fasting for at least 8 hours.    Blood Alcohol level:  Lab Results  Component Value Date   [facility] <10 10/17/2020   ETH <10 10/16/2020    Metabolic Disorder Labs: Lab Results  Component Value Date   HGBA1C 13.6 (H) 10/17/2020   MPG 344 10/17/2020   MPG 197.25 03/15/2020   No results found for: PROLACTIN Lab Results  Component Value Date   CHOL 206 (H) 10/21/2020   TRIG 367 (H) 10/21/2020   HDL 35 (L) 10/21/2020   CHOLHDL 5.9 10/21/2020   VLDL 73 (H) 10/21/2020   LDLCALC 98 10/21/2020   LDLCALC 77 03/18/2018    Physical Findings: AIMS: Facial and Oral Movements Muscles of Facial Expression: None, normal Lips and Perioral Area: None, normal Jaw: None, normal Tongue: None, normal,Extremity Movements Upper (arms, wrists, hands, fingers): None, normal Lower (legs, knees, ankles, toes): None, normal, Trunk Movements Neck, shoulders, hips: None, normal, Overall Severity Severity of abnormal movements (highest score from questions above): None, normal Incapacitation due to abnormal movements: None, normal Patient's awareness of abnormal movements (rate only patient's report): No Awareness, Dental Status Current problems with teeth and/or dentures?: Yes Does patient  usually wear dentures?: No  CIWA:    COWS:     Musculoskeletal: Strength & Muscle Tone: within normal limits Gait & Station: normal Patient leans: N/A  Psychiatric Specialty Exam:  Presentation  General Appearance: Disheveled  Eye Contact:Other (comment) ([place], glaring)  Speech:Pressured; Slurred  Speech Volume:Increased  Handedness:Right   Mood and Affect  Mood:Angry; Irritable  Affect:Labile   Thought Process  Thought Processes:Disorganized  Descriptions of Associations:tangential Orientation:Full (Time, Place and Person)  Thought Content:Obsessions; Delusions; Scattered  History of Schizophrenia/Schizoaffective disorder:Yes  Duration of Psychotic Symptoms:Greater than six months  Hallucinations:Denies Ideas of Reference:Paranoia; Percusatory; Delusions  Suicidal Thoughts:Denies Homicidal Thoughts:Denies  Sensorium  Memory:Immediate Poor; Recent Poor; Remote Poor  Judgment:Impaired  Insight:Poor   Executive Functions  Concentration:Poor  Attention Span:Poor  Recall:Poor  Fund of Knowledge:Poor  Language:Poor   Psychomotor Activity  Psychomotor Activity:increased, agitates easily,    Assets  Assets:Desire for Improvement   Sleep  Sleep:Fair, 7.45   Physical Exam: Physical Exam Pulmonary:     Effort: Pulmonary effort is normal.  Musculoskeletal:     Cervical back: Normal range of motion.  Neurological:     General: No focal deficit present.     Mental Status: He is alert.   ROS Blood pressure (!) 134/116, pulse 89, temperature 97.9 F (36.6 C), temperature source Oral, resp. rate 16, height  (1.93 m), weight 103.2 kg, SpO2 100 %. Body mass index is 27.69 kg/m.   Treatment Plan Summary: Daily contact with patient to assess and evaluate symptoms and progress in treatment and Medication management . Pt labile,psychotic, agitates easily. Increase trazodone for sleep.  1.  Schizoaffective disorder, bipolar type -  Continue Depakote 1000 mg BID- increased 10/21/20 (VPA level 39 on 500 mg TID), -Discontinue Haldol has patient unwilling to take this medication - Invega 9 mg nightly as patient states he is willing to take this medication.  We will try to encourage him to transition to long-acting injectable if no side effects noted -Continue Cogentin 1 mg twice a day for EPS prophylaxis   2.  Hypertension - Continue amlodipine 10 mg daily, lisinopril 15 mg daily   3.  Hyperlipidemia - Continue Lipitor 40 mg daily at bedtime   4.  Constipation - Dulcolax 5 mg daily   5.  Diabetes mellitus, uncontrolled - Appreciate input from diabetic coordinator.  Continue metformin 1000 mg daily, increase Lantus 30  mg daily.  NovoLog 4 units 3 times daily with meals in addition to correctional insulin with meals and bedtime.   Hemoglobin A1c 13.6 at outside hospital.  Continue gabapentin 600 mg 3 times a day for neuropathic pain   6.  Tobacco use disorder -Nicorette gum   7.  GERD -Protonix 40 mg daily  10/23/20: Psychiatric exam above reviewed and remains accurate. Assessment and plan above reviewed and updated.   Beverly Sessions, MD 10/23/2020, 2:33 PM

## 2020-10-23 NOTE — Progress Notes (Signed)
PT Cancellation Note  Patient Details Name: Dylan Jordan MRN: [id number] DOB: [birth year]   Cancelled Treatment:    Reason Eval/Treat Not Completed: Other (comment) Spoke with nurse to assess appropriateness of PT this morning.  She reports that pt's mood/behavior is not congruent with working with PT at this time.  Will check back this PM to assess if he is appropriate, will maintain on caseload.   Malachi Pro, DPT 10/23/2020, 9:54 AM

## 2020-10-23 NOTE — Progress Notes (Signed)
Patient has labile mood. Demanding (2) diet colas before agreeing to take QHS meds. He denies SI/HI/AVH depression and anxiety at this encounter. He refused to take his Depakote stating that he is not willing to take Haldol or Depakote, but will take Zyprexa. He is a argumentative on every subject discussed with him. He will continue to be monitored with Q 15 minute safety checks.    Cleo Butler-Nicholson, LPN

## 2020-10-23 NOTE — Progress Notes (Signed)
Patient is initially calm this morning and was cooperative with taking vital signs. He denies SI, HI, and AVH. He states that he will take all his medications except for Depakote. Patient then begins to become argumentative, saying, "why does a 65 year old man need a medicine to control his thoughts".  Patient also begins demanding orange juice. This writer explained that patient's blood sugars were very high and he could not get orange juice. However, patient was given coffee and Diet Coke. Later in the morning, patient begins repeatedly banging on the back halls. When this writer asked what I could help him with, patient walks away. Patient is also heard screaming in the back hall. Patient remains safe on the unit at this time.

## 2020-10-24 LAB — GLUCOSE, CAPILLARY
Glucose-Capillary: 241 mg/dL — ABNORMAL HIGH (ref 70–99)
Glucose-Capillary: 223 mg/dL — ABNORMAL HIGH (ref 70–99)
Glucose-Capillary: 163 mg/dL — ABNORMAL HIGH (ref 70–99)
Glucose-Capillary: 280 mg/dL — ABNORMAL HIGH (ref 70–99)

## 2020-10-24 MED ORDER — TRAZODONE HCL 100 MG PO TABS
100.0000 mg | ORAL_TABLET | Freq: Every day | ORAL | Status: DC
  Administered 2020-10-24 – 2020-10-27 (×3): 100 mg via ORAL
  Filled 2020-10-24 (×5): qty 1

## 2020-10-24 NOTE — [facility] Group Notes (Signed)
LCSW Group Therapy Note  10/24/2020 3:26 PM  Type of Therapy and Topic:  Group Therapy:  Creating Healthy Boundaries  Participation Level:  Active   Description of Group:   Patients in this group will discuss how creating boundaries supports mental health wellness and sobriety. Participants will consider examples of both healthy and unhealthy boundaries and potential consequences of both. Participants are asked to share how they have implemented healthy boundaries in the past and encouraged to create new boundaries to implement in their lives post hospitalization.   Therapeutic Goals: Patient will identify two or more emotions that lead to a relapse for them Patient will identify two emotions that result when they relapse Patient will identify two emotions related to recovery Patient will demonstrate ability to communicate their needs through discussion and/or role plays   Summary of Patient Progress: Patient was present for the entirety of the group session. Patient was an active listener and attempted to contribute to the topic of discussion. Patient was often tangential to the topic of discussion and hyper-focused on past family transgressions without consideration to others in the group. Patient remained mostly respectful to others in the group and allowed for others to contribute.     Therapeutic Modalities:   Cognitive Behavioral Therapy Solution-Focused Therapy Assertiveness Training Relapse Prevention Therapy   Gwenevere Ghazi, MSW, [place], Bridget Hartshorn 10/24/2020 3:26 PM

## 2020-10-24 NOTE — Progress Notes (Signed)
Patient refused vital signs.  Informed staff members not to come into his room.

## 2020-10-24 NOTE — Progress Notes (Signed)
Patient is cooperative with vital signs and assessment this morning. Patient was agreeable to remain calm throughout the day. He denies SI, HI, and AVH. Patient slept 4.25 hours and appetite is good. Patient complaining of right knee pain, Tylenol PRN was given. Patient is compliant with medications except for Depakote. Patient states that he is on too many medications. Support and encouragement provided. Patient remains safe on the unit at this time.

## 2020-10-24 NOTE — Progress Notes (Signed)
Patient took his medication after he awoke from resting.  He was requesting coffee with 3 splenda's and a milk.  Said he would like [place] to hold his breakfast until he awakes.

## 2020-10-24 NOTE — Progress Notes (Signed)
Inpatient Diabetes Program Recommendations  AACE/ADA: New Consensus Statement on Inpatient Glycemic Control   Target Ranges:  Prepandial:   less than 140 mg/dL      Peak postprandial:   less than 180 mg/dL (1-2 hours)      Critically ill patients:  140 - 180 mg/dL   Review of Glycemic Control  Current orders for Inpatient glycemic control: Lantus 30 units daily, Novolog 0-15 units TID with meals, Novolog 0-5 units QHS, Novolog 4 units TID with meals, Metformin XR 1000 mg QAM  Inpatient Diabetes Program Recommendations:    Insulin: Please consider increasing Lantus to 34 units daily. If agreeable, please also order one time Lantus 4 units x1 now since Lantus 30 units already given this am.   Thanks, Orlando Penner, RN, MSN, CDE Diabetes Coordinator Inpatient Diabetes Program [phone] (Team Pager from 8am to 5pm)

## 2020-10-24 NOTE — Progress Notes (Signed)
[facility] MD Progress Note  10/24/2020 11:50 AM Dylan Jordan  MRN:  [id number]  CC "I'm ok"  Subjective:  65 year old male with schizoaffective disorder bipolar type presenting for disorganized behavior, elevated mood, auditory and visual hallucinations, suicidal ideations, and homicidal ideations. No acute events overnight, medication compliant aside from Haldol, ADLs impaired. Per nursing- slept 4.15 hrs. Patient labile and demanding at times, but calmer today, took meds except depakote,    Pt continue to be labile,  hyperverbal but less irritable, pt agreeable to remain calm, pt not wanting to take depakote and alternatives,  not wantinmg to take " more meds"  poor insight,   Principal Problem: Schizoaffective disorder, bipolar type (HCC) Diagnosis: Principal Problem:   Schizoaffective disorder, bipolar type (HCC) Active Problems:   HTN (hypertension)   Mild persistent asthma   Slow transit constipation   Uncontrolled type 2 diabetes mellitus with hyperglycemia (HCC)  Total Time spent with patient: 25 min  Past Psychiatric History: See H&P  Past Medical History:  Past Medical History:  Diagnosis Date   Anemia    Asthma    Diabetes mellitus without complication (HCC)    Gout    Hyperlipidemia    Hyperosmolar hyperglycemic coma due to diabetes mellitus without ketoacidosis (HCC)    Hypertension    Schizophrenia (HCC) 07/06/2018    Past Surgical History:  Procedure Laterality Date   REPLACEMENT TOTAL KNEE Left    TOE SURGERY     Family History:  Family History  Adopted: Yes  Problem Relation Age of Onset   Allergic rhinitis Neg Hx    Angioedema Neg Hx    Asthma Neg Hx    Atopy Neg Hx    Eczema Neg Hx    Immunodeficiency Neg Hx    Urticaria Neg Hx    Family Psychiatric  History: See H&P Social History:  Social History   Substance and Sexual Activity  Alcohol Use No     Social History   Substance and Sexual Activity  Drug Use No    Social History    Socioeconomic History   Marital status: Single    Spouse name: Not on file   Number of children: Not on file   Years of education: Not on file   Highest education level: Not on file  Occupational History   Not on file  Tobacco Use   Smoking status: Some Days    Packs/day: 0.30    Years: 45.00    Pack years: 13.50    Types: Cigarettes    Start date: 05/16/1971   Smokeless tobacco: Never   Tobacco comments:    Decreased Intake to 10 cigarettes per week or less.  Vaping Use   Vaping Use: Never used  Substance and Sexual Activity   Alcohol use: No   Drug use: No   Sexual activity: Not on file  Other Topics Concern   Not on file  Social History Narrative   Lives by himself at home. Closest family member is niece.       ETOH none   Drugs non e   Current smoker, none for three days    Social Determinants of Health   Financial Resource Strain: Not on file  Food Insecurity: Not on file  Transportation Needs: Not on file  Physical Activity: Not on file  Stress: Not on file  Social Connections: Not on file   Additional Social History:  Sleep: Fair  Appetite:  Fair  Current Medications: Current Facility-Administered Medications  Medication Dose Route Frequency Provider Last Rate Last Admin   acetaminophen (TYLENOL) tablet 650 mg  650 mg Oral Q6H PRN Clapacs, Jackquline Denmark, MD   650 mg at 10/24/20 0713   albuterol (VENTOLIN HFA) 108 (90 Base) MCG/ACT inhaler 1-2 puff  1-2 puff Inhalation Q4H PRN Jesse Sans, MD   2 puff at 10/24/20 0735   alum & mag hydroxide-simeth (MAALOX/MYLANTA) 200-200-20 MG/5ML suspension 30 mL  30 mL Oral Q4H PRN Clapacs, John T, MD       amLODipine (NORVASC) tablet 10 mg  10 mg Oral Daily Clapacs, John T, MD   10 mg at 10/24/20 0729   atorvastatin (LIPITOR) tablet 40 mg  40 mg Oral QHS Jesse Sans, MD   40 mg at 10/23/20 2107   benztropine (COGENTIN) tablet 1 mg  1 mg Oral BID Clapacs, Jackquline Denmark, MD   1 mg at  10/24/20 0729   bisacodyl (DULCOLAX) EC tablet 5 mg  5 mg Oral Daily Jesse Sans, MD   5 mg at 10/24/20 9381   clindamycin (CLEOCIN) capsule 300 mg  300 mg Oral Q6H Jesse Sans, MD   300 mg at 10/24/20 0932   desmopressin (DDAVP) tablet 0.2 mg  0.2 mg Oral QHS Clapacs, John T, MD   0.2 mg at 10/23/20 2107   diphenhydrAMINE (BENADRYL) capsule 25 mg  25 mg Oral Q6H PRN Beverly Sessions, MD   25 mg at 10/24/20 0415   divalproex (DEPAKOTE) DR tablet 1,000 mg  1,000 mg Oral Q12H Jesse Sans, MD   1,000 mg at 10/23/20 1500   ferrous sulfate tablet 325 mg  325 mg Oral QODAY Clapacs, Jackquline Denmark, MD   325 mg at 10/24/20 0932   gabapentin (NEURONTIN) capsule 600 mg  600 mg Oral TID Jesse Sans, MD   600 mg at 10/24/20 1120   insulin aspart (novoLOG) injection 0-15 Units  0-15 Units Subcutaneous TID WC Jesse Sans, MD   5 Units at 10/24/20 1121   insulin aspart (novoLOG) injection 0-5 Units  0-5 Units Subcutaneous QHS Jesse Sans, MD   3 Units at 10/22/20 2139   insulin aspart (novoLOG) injection 4 Units  4 Units Subcutaneous TID WC Jesse Sans, MD   4 Units at 10/24/20 1120   insulin glargine (LANTUS) injection 30 Units  30 Units Subcutaneous Daily Jesse Sans, MD   30 Units at 10/24/20 0729   lisinopril (ZESTRIL) tablet 15 mg  15 mg Oral Daily Jesse Sans, MD   15 mg at 10/24/20 0175   magnesium hydroxide (MILK OF MAGNESIA) suspension 30 mL  30 mL Oral Daily PRN Clapacs, Jackquline Denmark, MD       melatonin tablet 5 mg  5 mg Oral QHS Clapacs, John T, MD   5 mg at 10/23/20 2108   metFORMIN (GLUCOPHAGE-XR) 24 hr tablet 1,000 mg  1,000 mg Oral Q breakfast Clapacs, Jackquline Denmark, MD   1,000 mg at 10/24/20 1025   nicotine polacrilex (NICORETTE) gum 2 mg  2 mg Oral Q4H PRN Jesse Sans, MD       OLANZapine [facility]) tablet 10 mg  10 mg Oral Q8H PRN Jesse Sans, MD   10 mg at 10/23/20 0827   paliperidone (INVEGA) 24 hr tablet 9 mg  9 mg Oral Daily Jesse Sans, MD   9 mg at  10/24/20 [phone]  pantoprazole (PROTONIX) EC tablet 40 mg  40 mg Oral Daily Clapacs, Jackquline Denmark, MD   40 mg at 10/24/20 0728   traZODone (DESYREL) tablet 50 mg  50 mg Oral QHS Beverly Sessions, MD   50 mg at 10/23/20 2107   ziprasidone (GEODON) injection 20 mg  20 mg Intramuscular Q8H PRN Jesse Sans, MD   20 mg at 10/22/20 1324    Lab Results:  Results for orders placed or performed during the hospital encounter of 10/20/20 (from the past 48 hour(s))  Glucose, capillary     Status: Abnormal   Collection Time: 10/22/20  4:22 PM  Result Value Ref Range   Glucose-Capillary 211 (H) 70 - 99 mg/dL    Comment: Glucose reference range applies only to samples taken after fasting for at least 8 hours.  Glucose, capillary     Status: Abnormal   Collection Time: 10/22/20  8:09 PM  Result Value Ref Range   Glucose-Capillary 297 (H) 70 - 99 mg/dL    Comment: Glucose reference range applies only to samples taken after fasting for at least 8 hours.   Comment 1 Notify RN   Glucose, capillary     Status: Abnormal   Collection Time: 10/23/20  7:31 AM  Result Value Ref Range   Glucose-Capillary 229 (H) 70 - 99 mg/dL    Comment: Glucose reference range applies only to samples taken after fasting for at least 8 hours.  Glucose, capillary     Status: Abnormal   Collection Time: 10/23/20 11:29 AM  Result Value Ref Range   Glucose-Capillary 264 (H) 70 - 99 mg/dL    Comment: Glucose reference range applies only to samples taken after fasting for at least 8 hours.  Glucose, capillary     Status: Abnormal   Collection Time: 10/23/20  4:10 PM  Result Value Ref Range   Glucose-Capillary 132 (H) 70 - 99 mg/dL    Comment: Glucose reference range applies only to samples taken after fasting for at least 8 hours.  Glucose, capillary     Status: Abnormal   Collection Time: 10/23/20  7:45 PM  Result Value Ref Range   Glucose-Capillary 112 (H) 70 - 99 mg/dL    Comment: Glucose reference range applies only to  samples taken after fasting for at least 8 hours.  Glucose, capillary     Status: Abnormal   Collection Time: 10/24/20  7:24 AM  Result Value Ref Range   Glucose-Capillary 241 (H) 70 - 99 mg/dL    Comment: Glucose reference range applies only to samples taken after fasting for at least 8 hours.  Glucose, capillary     Status: Abnormal   Collection Time: 10/24/20 11:18 AM  Result Value Ref Range   Glucose-Capillary 223 (H) 70 - 99 mg/dL    Comment: Glucose reference range applies only to samples taken after fasting for at least 8 hours.    Blood Alcohol level:  Lab Results  Component Value Date   ETH <10 10/17/2020   ETH <10 10/16/2020    Metabolic Disorder Labs: Lab Results  Component Value Date   HGBA1C 13.6 (H) 10/17/2020   MPG 344 10/17/2020   MPG 197.25 03/15/2020   No results found for: PROLACTIN Lab Results  Component Value Date   CHOL 206 (H) 10/21/2020   TRIG 367 (H) 10/21/2020   HDL 35 (L) 10/21/2020   CHOLHDL 5.9 10/21/2020   VLDL 73 (H) 10/21/2020   LDLCALC 98 10/21/2020   LDLCALC 77 03/18/2018  Physical Findings: AIMS: Facial and Oral Movements Muscles of Facial Expression: None, normal Lips and Perioral Area: None, normal Jaw: None, normal Tongue: None, normal,Extremity Movements Upper (arms, wrists, hands, fingers): None, normal Lower (legs, knees, ankles, toes): None, normal, Trunk Movements Neck, shoulders, hips: None, normal, Overall Severity Severity of abnormal movements (highest score from questions above): None, normal Incapacitation due to abnormal movements: None, normal Patient's awareness of abnormal movements (rate only patient's report): No Awareness, Dental Status Current problems with teeth and/or dentures?: Yes Does patient usually wear dentures?: No  CIWA:    COWS:     Musculoskeletal: Strength & Muscle Tone: within normal limits Gait & Station: normal Patient leans: N/A  Psychiatric Specialty Exam:  Presentation   General Appearance: Disheveled  Eye Contact:Other (comment) (Intesne, glaring)  Speech:Pressured; Slurred  Speech Volume:Increased  Handedness:Right   Mood and Affect  Mood:Angry; Irritable  Affect:Labile   Thought Process  Thought Processes:Disorganized  Descriptions of Associations:tangential Orientation:Full (Time, Place and Person)  Thought Content:Obsessions; Delusions; Scattered  History of Schizophrenia/Schizoaffective disorder:Yes  Duration of Psychotic Symptoms:Greater than six months  Hallucinations:Denies Ideas of Reference:Paranoia; Percusatory; Delusions  Suicidal Thoughts:Denies Homicidal Thoughts:Denies  Sensorium  Memory:Immediate Poor; Recent Poor; Remote Poor  Judgment:Impaired  Insight:Poor   Executive Functions  Concentration:Poor  Attention Span:Poor  Recall:Poor  Fund of Knowledge:Poor  Language:Poor   Psychomotor Activity  Psychomotor Activity:increased, agitates easily,    Assets  Assets:Desire for Improvement   Sleep  Sleep:Fair, 7.45   Physical Exam: Physical Exam Pulmonary:     Effort: Pulmonary effort is normal.  Musculoskeletal:        General: Normal range of motion.     Cervical back: Normal range of motion.  Neurological:     General: No focal deficit present.     Mental Status: He is alert.   ROS Blood pressure (!) 141/90, pulse 80, temperature 97.8 F (36.6 C), temperature source Oral, resp. rate 16, height 6\' 4"  (1.93 m), weight 103.2 kg, SpO2 100 %. Body mass index is 27.69 kg/m.   Treatment Plan Summary: Daily contact with patient to assess and evaluate symptoms and progress in treatment and Medication management . Pt labile,psychotic, slightly calmer today. Increase trazodone for sleep.  1.  Schizoaffective disorder, bipolar type - Continue Depakote 1000 mg BID if he agrees to take, was  increased 10/21/20 (VPA level 39 on 500 mg TID), -Discontinue Haldol has patient unwilling to take this  medication - Invega 9 mg nightly as patient states he is willing to take this medication.  We will try to encourage him to transition to long-acting injectable if no side effects noted -Continue Cogentin 1 mg twice a day for EPS prophylaxis   2.  Hypertension - Continue amlodipine 10 mg daily, lisinopril 15 mg daily   3.  Hyperlipidemia - Continue Lipitor 40 mg daily at bedtime   4.  Constipation - Dulcolax 5 mg daily   5.  Diabetes mellitus, uncontrolled - Appreciate input from diabetic coordinator.  Continue metformin 1000 mg daily, increase Lantus 30  mg daily.  NovoLog 4 units 3 times daily with meals in addition to correctional insulin with meals and bedtime.   Hemoglobin A1c 13.6 at outside hospital.  Continue gabapentin 600 mg 3 times a day for neuropathic pain   6.  Tobacco use disorder -Nicorette gum   7.  GERD -Protonix 40 mg daily  10/24/20: Psychiatric exam above reviewed and remains accurate. Assessment and plan above reviewed and updated.   12/24/20,  MD 10/24/2020, 11:50 AM

## 2020-10-24 NOTE — Progress Notes (Signed)
Patient was banging on the door and demanding, requesting for snack and Coke.  He refused his Depakote but took all his other prescribed medications.  He did not require insulin due to CBG readings.  He was given snack, several coffees, water bottle, and diet coke.  He denies SI, HI, and AVH. He stated he wanted to sleep after eating his snack.  Patient remained safe on the unit at all times.

## 2020-10-24 NOTE — Evaluation (Signed)
Physical Therapy Evaluation Patient Details Name: Dylan Jordan MRN: [id number] DOB: Oct 17, 1955 Today's Date: 10/24/2020   History of Present Illness  65 year old male with schizoaffective disorder bipolar type presenting for disorganized behavior, elevated mood, auditory and visual hallucinations, suicidal ideations, and homicidal ideations.  Clinical Impression  Pt did relatively well with PT exam. He was able to ambulate many 100s of feet with both walker and w/o AD.  He had more prevalent limp (on R) w/o AD but did not show any overt LOBs or unsteadiness.  Pt scored a 48/56 on the Berg Balance Scale and would benefit from continued AD use, however functional balance for gait, etc was appropriate on flat surfaces/predicable setting w/o AD.  Educated pt on HEP and considerations for balance and chronic R knee pain.  Overall pt did well and is appropriate for d/c from a PT standpoint w/o further ADs/follow up. Will maintain on caseload to continue working on balance and HEP.      Follow Up Recommendations No PT follow up    Equipment Recommendations  None recommended by PT    Recommendations for Other Services       Precautions / Restrictions Precautions Precautions: None Restrictions Weight Bearing Restrictions: No      Mobility  Bed Mobility Overal bed mobility: Independent                  Transfers Overall transfer level: Independent Equipment used: None             General transfer comment: Pt able to rise w/o direct assist, stabilizes well  Ambulation/Gait Ambulation/Gait assistance: Supervision Gait Distance (Feet): 800 Feet Assistive device: Rolling walker (2 wheeled)       General Gait Details: Pt able to ambulate confidently with walker and though he had mild limp on the R he generally did well and did not have any LOBs.  Stairs            Wheelchair Mobility    Modified Rankin (Stroke Patients Only)       Balance Overall balance  assessment: Modified Independent                               Standardized Balance Assessment Standardized Balance Assessment : Berg Balance Test Berg Balance Test Sit to Stand: Able to stand without using hands and stabilize independently Standing Unsupported: Able to stand safely 2 minutes Sitting with Back Unsupported but Feet Supported on Floor or Stool: Able to sit safely and securely 2 minutes Stand to Sit: Sits safely with minimal use of hands Transfers: Able to transfer safely, minor use of hands Standing Unsupported with Eyes Closed: Able to stand 10 seconds safely Standing Ubsupported with Feet Together: Able to place feet together independently and stand 1 minute safely From Standing, Reach Forward with Outstretched Arm: Can reach forward >12 cm safely (5") From Standing Position, Pick up Object from Floor: Able to pick up shoe safely and easily From Standing Position, Turn to Look Behind Over each Shoulder: Looks behind one side only/other side shows less weight shift Turn 360 Degrees: Able to turn 360 degrees safely in 4 seconds or less Standing Unsupported, Alternately Place Feet on Step/Stool: Able to stand independently and complete 8 steps >20 seconds Standing Unsupported, One Foot in Front: Able to take small step independently and hold 30 seconds Standing on One Leg: Tries to lift leg/unable to hold 3 seconds but remains standing independently Total  Score: 48         Pertinent Vitals/Pain Pain Assessment: 0-10 Pain Score: 3  Pain Location: 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    Home Living Family/patient expects to be discharged to:: Unsure Living Arrangements: Alone               Additional Comments: pt reports that he lives in an apartment; staff reports that he is, in reality, homeless    Prior Function Level of Independence: Independent with assistive device(s)         Comments: Pt uses SPC at baseline, reports he can do all  he needs w/o assist, does not drive but does a lot of walking     Hand Dominance        Extremity/Trunk Assessment   Upper Extremity Assessment Upper Extremity Assessment: Overall WFL for tasks assessed    Lower Extremity Assessment Lower Extremity Assessment: Overall WFL for tasks assessed       Communication   Communication: No difficulties  Cognition Arousal/Alertness: Awake/alert Behavior During Therapy: Restless Overall Cognitive Status: Within Functional Limits for tasks assessed                                 General Comments: Pt with history of agitation in [facility] unit, does not display any during PT exam      General Comments General comments (skin integrity, edema, etc.): Pt with relatively confident balance w/o AD, but clearly more stable with it.  Recommended to staff that his baseline SPC is more appropriate, but as that is not an option in [facility] that a walker is preferred.    Exercises     Assessment/Plan    PT Assessment Patient needs continued PT services  PT Problem List Decreased balance;Decreased safety awareness       PT Treatment Interventions DME instruction;Gait training;Balance training;Neuromuscular re-education;Therapeutic exercise;Therapeutic activities;Patient/family education    PT Goals (Current goals can be found in the Care Plan section)  Acute Rehab PT Goals Patient Stated Goal: get out of the hospital PT Goal Formulation: With patient Time For Goal Achievement: 11/07/20 Potential to Achieve Goals: Good    Frequency Min 2X/week   Barriers to discharge        Co-evaluation               AM-PAC PT "6 Clicks" Mobility  Outcome Measure Help needed turning from your back to your side while in a flat bed without using bedrails?: None Help needed moving from lying on your back to sitting on the side of a flat bed without using bedrails?: None Help needed moving to and from a bed to a chair (including a  wheelchair)?: None Help needed standing up from a chair using your arms (e.g., wheelchair or bedside chair)?: None Help needed to walk in hospital room?: None Help needed climbing 3-5 steps with a railing? : None 6 Click Score: 24    End of Session Equipment Utilized During Treatment: Gait belt Activity Tolerance: Patient tolerated treatment well Patient left: in bed Nurse Communication: Mobility status PT Visit Diagnosis: Unsteadiness on feet (R26.81)    Time: 0935-1000 PT Time Calculation (min) (ACUTE ONLY): 25 min   Charges:   PT Evaluation $PT Eval Low Complexity: 1 Low PT Treatments $Therapeutic Activity: 8-22 mins        Malachi Pro, DPT 10/24/2020, 1:42 PM

## 2020-10-25 LAB — GLUCOSE, CAPILLARY
Glucose-Capillary: 311 mg/dL — ABNORMAL HIGH (ref 70–99)
Glucose-Capillary: 260 mg/dL — ABNORMAL HIGH (ref 70–99)
Glucose-Capillary: 107 mg/dL — ABNORMAL HIGH (ref 70–99)
Glucose-Capillary: 191 mg/dL — ABNORMAL HIGH (ref 70–99)

## 2020-10-25 MED ORDER — INSULIN GLARGINE 100 UNIT/ML ~~LOC~~ SOLN
35.0000 [IU] | Freq: Every day | SUBCUTANEOUS | Status: DC
  Administered 2020-10-26: 35 [IU] via SUBCUTANEOUS
  Filled 2020-10-25 (×2): qty 0.35

## 2020-10-25 MED ORDER — PALIPERIDONE PALMITATE ER 234 MG/1.5ML IM SUSY
234.0000 mg | PREFILLED_SYRINGE | Freq: Once | INTRAMUSCULAR | Status: DC
  Filled 2020-10-25: qty 1.5

## 2020-10-25 MED ORDER — LIDOCAINE 5 % EX OINT
TOPICAL_OINTMENT | Freq: Two times a day (BID) | CUTANEOUS | Status: DC | PRN
  Administered 2020-10-26: 1 via TOPICAL
  Filled 2020-10-25: qty 35.44

## 2020-10-25 MED ORDER — INSULIN ASPART 100 UNIT/ML IJ SOLN
7.0000 [IU] | Freq: Three times a day (TID) | INTRAMUSCULAR | Status: DC
  Administered 2020-10-25 – 2020-10-26 (×5): 7 [IU] via SUBCUTANEOUS
  Filled 2020-10-25 (×5): qty 1

## 2020-10-25 NOTE — Progress Notes (Signed)
   10/25/20 0900  Clinical Encounter Type  Visited With Other (Comment) (Nurse)  Visit Type Other (Comment) (Following up on request)  Referral From Chaplain  Consult/Referral To Chaplain  Spiritual Encounters  Spiritual Needs Sacred text  Dylan Jordan followed-up on a request that was made to provide a large print Q'uran. Chaplain Jordan did not yet meet with patient but discussed needs with the nursing staff. A large-print text is not available but the chaplain has a plastic magnifier that she will bring on 6/14 that may be helpful if it is material that can be safely utilized within the unit. Dylan Jordan will attempt additional follow-up to speak directly with the PT later today to help meet his spiritual needs.

## 2020-10-25 NOTE — [facility] Counselor (Addendum)
ADDENDUM  [name] attempted multiple times to contact the Social Security office to assist patient. Once connected with a representative, the call was dropped on both instances, [name] will continue to monitor situation and coordinate with ACTT to serve patient needs.   Signed:  Corky Crafts, MSW, [place], LCASA 10/25/2020 4:11 PM   Patient continues to repeatedly present to the nurses station demanding social work call social security. [name] confirmed with patient's ACTT team that they are following patient social security claim and that it remains in processing. Information relayed to patient again and provided information for both his ACTT and Social Security.   Signed:  Corky Crafts, MSW, LCSWA, LCASA 10/25/2020 10:30 AM
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
## 2020-10-25 NOTE — [facility] Group Notes (Addendum)
LCSW Group Therapy Note     10/25/2020 2:42 PM     Type of Therapy and Topic:  Group Therapy:  Overcoming Obstacles     Participation Level:  Active     Description of Group:     In this group patients will be encouraged to explore what they see as obstacles to their own wellness and recovery. They will be guided to discuss their thoughts, feelings, and behaviors related to these obstacles. The group will process together ways to cope with barriers, with attention given to specific choices patients can make. Each patient will be challenged to identify changes they are motivated to make in order to overcome their obstacles. This group will be process-oriented, with patients participating in exploration of their own experiences as well as giving and receiving support and challenge from other group members.     Therapeutic Goals:  1.    Patient will identify personal and current obstacles as they relate to admission.  2.    Patient will identify barriers that currently interfere with their wellness or overcoming obstacles.  3.    Patient will identify feelings, thought process and behaviors related to these barriers.  4.    Patient will identify two changes they are willing to make to overcome these obstacles:        Summary of Patient Progress:   Patient was present for the entirety of the group session. Patient was an active listener and participated in the topic of discussion, provided helpful advice to others, and added nuance to topic of conversation.  Patient shared some advice about limiting alcohol use to another pt saying "try to say no in the moment." He discussed his past obstacles of overcoming polysubstance use and how his faith in Allah helps him limit or stop use.    Therapeutic Modalities:    Cognitive Behavioral Therapy  Solution Focused Therapy  Motivational Interviewing  Relapse Prevention Therapy     Serenidy Waltz [place], MSW, LCSW-A  10/25/2020 2:42  PM

## 2020-10-25 NOTE — Plan of Care (Signed)
  Problem: Education: Goal: Knowledge of [facility] Education information/materials will improve Outcome: Progressing Goal: Emotional status will improve Outcome: Progressing Goal: Mental status will improve Outcome: Progressing Goal: Verbalization of understanding the information provided will improve Outcome: Progressing   Problem: Activity: Goal: Interest or engagement in activities will improve Outcome: Progressing Goal: Sleeping patterns will improve Outcome: Progressing   Problem: Coping: Goal: Ability to verbalize frustrations and anger appropriately will improve Outcome: Progressing Goal: Ability to demonstrate self-control will improve Outcome: Progressing   

## 2020-10-25 NOTE — Progress Notes (Signed)
D: Pt alert and oriented. Pt rates depression 6/10 and anxiety 10/10. Pt reports experiencing 10/10 bilat leg pain, prn med given. Pt denies experiencing any SI/HI, or AVH at this time.   Pt is at the nurse's station frequently making request and demands. Pt becomes upset when demands/request are unable to be met or are not met immediately. Pt makes many racial comments and LGBTQ comments when request are denied stating it's because his black or because he's not gay. Pt reassured that this facility does not tolerate discrimination in any form and that's not the case.  A: Scheduled medications administered to pt, per MD orders. Support and encouragement provided. Frequent verbal contact made. Routine safety checks conducted q15 minutes.   R: No adverse drug reactions noted. Pt verbally contracts for safety at this time. Pt remains safe at this time. Will continue to monitor.

## 2020-10-25 NOTE — Progress Notes (Signed)
   10/25/20 1308  Clinical Encounter Type  Visited With Patient  Visit Type Initial;Spiritual support;Social support  Referral From Chaplain  Consult/Referral To Chaplain  Spiritual Encounters  Spiritual Needs Sacred text  Dylan Jordan received a referral from another chaplain regarding patient's need for a large print Quran. This was not available but [employer] printed some large sized text that contained some meditations and several key verses to share with Dylan Jordan. Chaplain Burris provided this material and established a relationship of care and support with Dylan Jordan. Chaplain Burris offered general pastoral support and will plan to follow-up as able or as needed.

## 2020-10-25 NOTE — Progress Notes (Signed)
Inpatient Diabetes Program Recommendations  AACE/ADA: New Consensus Statement on Inpatient Glycemic Control   Target Ranges:  Prepandial:   less than 140 mg/dL      Peak postprandial:   less than 180 mg/dL (1-2 hours)      Critically ill patients:  140 - 180 mg/dL  Results for DABNEY, SCHANZ (MRN [id number]) as of 10/25/2020 07:25  Ref. Range 10/24/2020 07:24 10/24/2020 11:18 10/24/2020 16:11 10/24/2020 19:56 10/25/2020 07:09  Glucose-Capillary Latest Ref Range: 70 - 99 mg/dL 287 (H) 867 (H) 672 (H) 280 (H) 311 (H)   Review of Glycemic Control  Current orders for Inpatient glycemic control: Lantus 30 units daily, Novolog 0-15 units TID with meals, Novolog 0-5 units QHS, Novolog 4 units TID with meals, Metformin XR 1000 mg QAM   Inpatient Diabetes Program Recommendations:     Insulin: Please consider increasing Lantus to 35 units daily and meal coverage to Novolog 7 units TID with meals.  Thanks, Orlando Penner, RN, MSN, CDE Diabetes Coordinator Inpatient Diabetes Program [phone] (Team Pager from 8am to 5pm)

## 2020-10-25 NOTE — Progress Notes (Signed)
Patient has had a better affect today. More pleasant and more cooperative.  Less demanding with his needs and more understanding of his diabetes and the need to eat better and healthier.He is active on the unit and has been engaging well with other patients.  He denies SI/HI?AVH depression and anxiety at this encounter. Will continue to monitor and encouraged to come to staff with a ny concerns.   Cleo Butler-Nicholson, LPN

## 2020-10-25 NOTE — Progress Notes (Signed)
Patient is cooperative.  He denies SI/HI/AVH has complaint of R knee pain, rated 8/10 received Tylenol and tolerated without incident.  Knee does appear to be swollen, advised to rest knee.  Blood sugar was 280 at QHS reading, received 3 units of insulin.  Refused his Depakote medication again, but was complaint with all other meds.  Has to be redirected at times but is no behavior issue. Remains safe with 15 minute safety checks and encouraged to come to staff with any concerns.     Cleo Butler-Nicholson, LPN

## 2020-10-25 NOTE — Progress Notes (Addendum)
Pt refused invega injection, MD made aware. Pt stated it's too high a dose that'll make you MR. Pt states he's fine with his pills. Pt also refused Depakote this morning, MD also aware of this.

## 2020-10-25 NOTE — Plan of Care (Signed)
  Problem: Education: Goal: Emotional status will improve Outcome: Progressing Goal: Mental status will improve Outcome: Progressing Goal: Verbalization of understanding the information provided will improve Outcome: Progressing   Problem: Coping: Goal: Coping ability will improve Outcome: Progressing

## 2020-10-25 NOTE — Progress Notes (Signed)
[facility]  10/25/2020 12:39 PM Parthenia Ameshilip Germer  MRN:  [id number]  CC " I need a few things."  Subjective:  65 year old male with schizoaffective disorder bipolar type presenting for disorganized behavior, elevated mood, auditory and visual hallucinations, suicidal ideations, and homicidal ideations. No acute events overnight, medication compliant aside from Depakote, ADLs improved.  This morning patient seen one-on-one.  He is significantly calmer than last seen on Friday.  He continues to have a list of request, but is much more redirectable.  He shows me a list of questions he has for me.  Firstly he asked for not addicting pain medications to assist with his knee pain.  Given poor kidney function ibuprofen does not seem to be a good option for patient.  We will continue Tylenol and provide topical cream to assist with pain.  He also continues to state he needs help calling the Social Security office.  He states he is aware that his act team has submitted an application on his behalf.  However, he notes that someone from social security called his phone to do a follow-up questionnaire with him.  [name] team reaching out to make sure that this is an individual that actually works for [employer] and not a spam call.  He also requests that we find his belongings at [facility].  Nursing has called multiple times to check with ED staff, care coordinators and his belongings have not been found.  However, there was a note in the chart that patient did in fact have belongings and this does not appear to be a delusion.  He also requests help getting section 8 housing.  He was surprisingly calm and understanding when we explained that the hospital cannot secure housing on his behalf.  We did discuss long-term shelters such as the [facility] where he did not have to leave during the day and that would eventually help him find housing.  He seemed somewhat agreeable to this plan.  His glucose  levels have continued to be very elevated.  Appreciate diabetic coordinator assistance.  Will increase both Lantus and NovoLog.  Principal Problem: Schizoaffective disorder, bipolar type (HCC) Diagnosis: Principal Problem:   Schizoaffective disorder, bipolar type (HCC) Active Problems:   HTN (hypertension)   Mild persistent asthma   Slow transit constipation   Uncontrolled type 2 diabetes mellitus with hyperglycemia (HCC)  Total Time spent with patient: 30 minutes  Past Psychiatric History: See H&P  Past Medical History:  Past Medical History:  Diagnosis Date   Anemia    Asthma    Diabetes mellitus without complication (HCC)    Gout    Hyperlipidemia    Hyperosmolar hyperglycemic coma due to diabetes mellitus without ketoacidosis (HCC)    Hypertension    Schizophrenia (HCC) 07/06/2018    Past Surgical History:  Procedure Laterality Date   REPLACEMENT TOTAL KNEE Left    TOE SURGERY     Family History:  Family History  Adopted: Yes  Problem Relation Age of Onset   Allergic rhinitis Neg Hx    Angioedema Neg Hx    Asthma Neg Hx    Atopy Neg Hx    Eczema Neg Hx    Immunodeficiency Neg Hx    Urticaria Neg Hx    Family Psychiatric  History: See H&P Social History:  Social History   Substance and Sexual Activity  Alcohol Use No     Social History   Substance and Sexual Activity  Drug Use No  Social History   Socioeconomic History   Marital status: Single    Spouse name: Not on file   Number of children: Not on file   Years of education: Not on file   Highest education level: Not on file  Occupational History   Not on file  Tobacco Use   Smoking status: Some Days    Packs/day: 0.30    Years: 45.00    Pack years: 13.50    Types: Cigarettes    Start date: 05/16/1971   Smokeless tobacco: Never   Tobacco comments:    Decreased Intake to 10 cigarettes per week or less.  Vaping Use   Vaping Use: Never used  Substance and Sexual Activity   Alcohol use: No    Drug use: No   Sexual activity: Not on file  Other Topics Concern   Not on file  Social History Narrative   Lives by himself at home. Closest family member is niece.       ETOH none   Drugs non e   Current smoker, none for three days    Social Determinants of Health   Financial Resource Strain: Not on file  Food Insecurity: Not on file  Transportation Needs: Not on file  Physical Activity: Not on file  Stress: Not on file  Social Connections: Not on file   Additional Social History:      Sleep: Fair  Appetite:  Fair  Current Medications: Current Facility-Administered Medications  Medication Dose Route Frequency Provider Last Rate Last Admin   acetaminophen (TYLENOL) tablet 650 mg  650 mg Oral Q6H PRN Clapacs, Jackquline Denmark, MD   650 mg at 10/25/20 0804   albuterol (VENTOLIN HFA) 108 (90 Base) MCG/ACT inhaler 1-2 puff  1-2 puff Inhalation Q4H PRN Jesse Sans, MD   2 puff at 10/25/20 0759   alum & mag hydroxide-simeth (MAALOX/MYLANTA) 200-200-20 MG/5ML suspension 30 mL  30 mL Oral Q4H PRN Clapacs, John T, MD       amLODipine (NORVASC) tablet 10 mg  10 mg Oral Daily Clapacs, John T, MD   10 mg at 10/25/20 0756   atorvastatin (LIPITOR) tablet 40 mg  40 mg Oral QHS Jesse Sans, MD   40 mg at 10/24/20 2121   benztropine (COGENTIN) tablet 1 mg  1 mg Oral BID Clapacs, Jackquline Denmark, MD   1 mg at 10/25/20 0756   bisacodyl (DULCOLAX) EC tablet 5 mg  5 mg Oral Daily Jesse Sans, MD   5 mg at 10/25/20 0757   clindamycin (CLEOCIN) capsule 300 mg  300 mg Oral Q6H Jesse Sans, MD   300 mg at 10/25/20 0800   desmopressin (DDAVP) tablet 0.2 mg  0.2 mg Oral QHS Clapacs, John T, MD   0.2 mg at 10/24/20 2125   diphenhydrAMINE (BENADRYL) capsule 25 mg  25 mg Oral Q6H PRN Beverly Sessions, MD   25 mg at 10/24/20 2121   divalproex (DEPAKOTE) DR tablet 1,000 mg  1,000 mg Oral Q12H Jesse Sans, MD   1,000 mg at 10/23/20 1500   ferrous sulfate tablet 325 mg  325 mg Oral QODAY Clapacs,  Jackquline Denmark, MD   325 mg at 10/24/20 0932   gabapentin (NEURONTIN) capsule 600 mg  600 mg Oral TID Jesse Sans, MD   600 mg at 10/25/20 1207   insulin aspart (novoLOG) injection 0-15 Units  0-15 Units Subcutaneous TID WC Jesse Sans, MD   8 Units at 10/25/20 1210  insulin aspart (novoLOG) injection 0-5 Units  0-5 Units Subcutaneous QHS Jesse Sans, MD   3 Units at 10/24/20 2216   insulin aspart (novoLOG) injection 7 Units  7 Units Subcutaneous TID WC Jesse Sans, MD   7 Units at 10/25/20 1210   [START ON 10/26/2020] insulin glargine (LANTUS) injection 35 Units  35 Units Subcutaneous Daily Jesse Sans, MD       lidocaine (XYLOCAINE) 5 % ointment   Topical BID PRN Jesse Sans, MD       lisinopril (ZESTRIL) tablet 15 mg  15 mg Oral Daily Jesse Sans, MD   15 mg at 10/25/20 0756   magnesium hydroxide (MILK OF MAGNESIA) suspension 30 mL  30 mL Oral Daily PRN Clapacs, Jackquline Denmark, MD       melatonin tablet 5 mg  5 mg Oral QHS Clapacs, John T, MD   5 mg at 10/24/20 2122   metFORMIN (GLUCOPHAGE-XR) 24 hr tablet 1,000 mg  1,000 mg Oral Q breakfast Clapacs, John T, MD   1,000 mg at 10/25/20 0756   nicotine polacrilex (NICORETTE) gum 2 mg  2 mg Oral Q4H PRN Jesse Sans, MD       OLANZapine [facility]) tablet 10 mg  10 mg Oral Q8H PRN Jesse Sans, MD   10 mg at 10/25/20 0806   paliperidone (INVEGA SUSTENNA) injection 234 mg  234 mg Intramuscular Once Jesse Sans, MD       paliperidone (INVEGA) 24 hr tablet 9 mg  9 mg Oral Daily Jesse Sans, MD   9 mg at 10/25/20 0755   pantoprazole (PROTONIX) EC tablet 40 mg  40 mg Oral Daily Clapacs, Jackquline Denmark, MD   40 mg at 10/25/20 0756   traZODone (DESYREL) tablet 100 mg  100 mg Oral QHS Beverly Sessions, MD   100 mg at 10/24/20 2123   ziprasidone (GEODON) injection 20 mg  20 mg Intramuscular Q8H PRN Jesse Sans, MD   20 mg at 10/22/20 1324    Lab Results:  Results for orders placed or performed during the hospital encounter  of 10/20/20 (from the past 48 hour(s))  Glucose, capillary     Status: Abnormal   Collection Time: 10/23/20  4:10 PM  Result Value Ref Range   Glucose-Capillary 132 (H) 70 - 99 mg/dL    Comment: Glucose reference range applies only to samples taken after fasting for at least 8 hours.  Glucose, capillary     Status: Abnormal   Collection Time: 10/23/20  7:45 PM  Result Value Ref Range   Glucose-Capillary 112 (H) 70 - 99 mg/dL    Comment: Glucose reference range applies only to samples taken after fasting for at least 8 hours.  Glucose, capillary     Status: Abnormal   Collection Time: 10/24/20  7:24 AM  Result Value Ref Range   Glucose-Capillary 241 (H) 70 - 99 mg/dL    Comment: Glucose reference range applies only to samples taken after fasting for at least 8 hours.  Glucose, capillary     Status: Abnormal   Collection Time: 10/24/20 11:18 AM  Result Value Ref Range   Glucose-Capillary 223 (H) 70 - 99 mg/dL    Comment: Glucose reference range applies only to samples taken after fasting for at least 8 hours.  Glucose, capillary     Status: Abnormal   Collection Time: 10/24/20  4:11 PM  Result Value Ref Range   Glucose-Capillary 163 (H) 70 -  99 mg/dL    Comment: Glucose reference range applies only to samples taken after fasting for at least 8 hours.  Glucose, capillary     Status: Abnormal   Collection Time: 10/24/20  7:56 PM  Result Value Ref Range   Glucose-Capillary 280 (H) 70 - 99 mg/dL    Comment: Glucose reference range applies only to samples taken after fasting for at least 8 hours.  Glucose, capillary     Status: Abnormal   Collection Time: 10/25/20  7:09 AM  Result Value Ref Range   Glucose-Capillary 311 (H) 70 - 99 mg/dL    Comment: Glucose reference range applies only to samples taken after fasting for at least 8 hours.  Glucose, capillary     Status: Abnormal   Collection Time: 10/25/20 11:41 AM  Result Value Ref Range   Glucose-Capillary 260 (H) 70 - 99 mg/dL     Comment: Glucose reference range applies only to samples taken after fasting for at least 8 hours.   Comment 1 Notify RN     Blood Alcohol level:  Lab Results  Component Value Date   ETH <10 10/17/2020   ETH <10 10/16/2020    Metabolic Disorder Labs: Lab Results  Component Value Date   HGBA1C 13.6 (H) 10/17/2020   MPG 344 10/17/2020   MPG 197.25 03/15/2020   No results found for: PROLACTIN Lab Results  Component Value Date   CHOL 206 (H) 10/21/2020   TRIG 367 (H) 10/21/2020   HDL 35 (L) 10/21/2020   CHOLHDL 5.9 10/21/2020   VLDL 73 (H) 10/21/2020   LDLCALC 98 10/21/2020   LDLCALC 77 03/18/2018    Physical Findings: AIMS: Facial and Oral Movements Muscles of Facial Expression: None, normal Lips and Perioral Area: None, normal Jaw: None, normal Tongue: None, normal,Extremity Movements Upper (arms, wrists, hands, fingers): None, normal Lower (legs, knees, ankles, toes): None, normal, Trunk Movements Neck, shoulders, hips: None, normal, Overall Severity Severity of abnormal movements (highest score from questions above): None, normal Incapacitation due to abnormal movements: None, normal Patient's awareness of abnormal movements (rate only patient's report): No Awareness, Dental Status Current problems with teeth and/or dentures?: Yes Does patient usually wear dentures?: No  CIWA:    COWS:     Musculoskeletal: Strength & Muscle Tone: within normal limits Gait & Station: normal Patient leans: N/A  Psychiatric Specialty Exam:  Presentation  General Appearance: Casual  Eye Contact:Good  Speech:Normal Rate  Speech Volume:Increased  Handedness:Right   Mood and Affect  Mood:Euthymic  Affect:Congruent   Thought Process  Thought Processes:Coherent  Descriptions of Associations:Intact  Orientation:Full (Time, Place and Person)  Thought Content:Perseveration  History of Schizophrenia/Schizoaffective disorder:Yes  Duration of Psychotic  Symptoms:Greater than six months  Hallucinations:Denies Ideas of Reference:Paranoia; Percusatory; Delusions  Suicidal Thoughts:Denies Homicidal Thoughts:Denies  Sensorium  Memory:Immediate Fair; Recent Fair; Remote Fair  Judgment:Intact  Insight:Present   Executive Functions  Concentration:Fair  Attention Span:Fair  Recall:Fair  Fund of Knowledge:Fair  Language:Fair   Psychomotor Activity  Psychomotor Activity:Psychomotor Activity: Normal   Assets  Assets:Communication Skills; Desire for Improvement; Financial Resources/Insurance; Resilience; Social Support   Sleep  Sleep:Fair, 5 hours   Physical Exam: Physical Exam ROS Blood pressure 132/85, pulse 87, temperature 98.3 F (36.8 C), temperature source Oral, resp. rate 18, height 6\' 4"  (1.93 m), weight 103.2 kg, SpO2 100 %. Body mass index is 27.69 kg/m.   Treatment Plan Summary: Daily contact with patient to assess and evaluate symptoms and progress in treatment and Medication management 1.  Schizoaffective disorder, bipolar type - Continue Depakote 1000 mg BID- increased 10/21/20 (VPA level 39 on 500 mg TID), --Continue Invega 9 mg nightly, will off Invega Sustenna 234 mg IM long-acting injectable today -Continue Cogentin 1 mg twice a day for EPS prophylaxis   2.  Hypertension - Continue amlodipine 10 mg daily, lisinopril 15 mg daily   3.  Hyperlipidemia - Continue Lipitor 40 mg daily at bedtime   4.  Constipation - Dulcolax 5 mg daily   5.  Diabetes mellitus, uncontrolled - Appreciate input from diabetic coordinator.  Continue metformin 1000 mg daily, increase Lantus 35  mg daily.  NovoLog 7 units 3 times daily with meals in addition to correctional insulin with meals and bedtime.   Hemoglobin A1c 13.6 at outside hospital.  Continue gabapentin 600 mg 3 times a day for neuropathic pain   6.  Tobacco use disorder -Nicorette gum   7.  GERD -Protonix 40 mg daily  10/25/20: Psychiatric exam above  reviewed and remains accurate. Assessment and plan above reviewed and updated.    Jesse Sans, MD 10/25/2020, 12:39 PM

## 2020-10-26 LAB — BASIC METABOLIC PANEL
Anion gap: 7 (ref 5–15)
BUN: 28 mg/dL — ABNORMAL HIGH (ref 8–23)
CO2: 20 mmol/L — ABNORMAL LOW (ref 22–32)
Calcium: 9 mg/dL (ref 8.9–10.3)
Chloride: 106 mmol/L (ref 98–111)
Creatinine, Ser: 1.33 mg/dL — ABNORMAL HIGH (ref 0.61–1.24)
GFR, Estimated: 60 mL/min — ABNORMAL LOW (ref >60)
Glucose, Bld: 306 mg/dL — ABNORMAL HIGH (ref 70–99)
Potassium: 5.1 mmol/L (ref 3.5–5.1)
Sodium: 133 mmol/L — ABNORMAL LOW (ref 135–145)

## 2020-10-26 LAB — GLUCOSE, CAPILLARY
Glucose-Capillary: 403 mg/dL — ABNORMAL HIGH (ref 70–99)
Glucose-Capillary: 197 mg/dL — ABNORMAL HIGH (ref 70–99)
Glucose-Capillary: 223 mg/dL — ABNORMAL HIGH (ref 70–99)
Glucose-Capillary: 160 mg/dL — ABNORMAL HIGH (ref 70–99)

## 2020-10-26 MED ORDER — OLANZAPINE 10 MG PO TABS
10.0000 mg | ORAL_TABLET | Freq: Every day | ORAL | Status: DC
  Administered 2020-10-26 – 2020-10-27 (×2): 10 mg via ORAL
  Filled 2020-10-26 (×2): qty 1

## 2020-10-26 NOTE — Plan of Care (Signed)
  Problem: Group Participation Goal: STG - Patient will engage in interactions with peers and staff in pro-social manner at least 2x within 5 recreation therapy group sessions Description: STG - Patient will engage in interactions with peers and staff in pro-social manner at least 2x within 5 recreation therapy group sessions Outcome: Progressing   

## 2020-10-26 NOTE — [facility] Group Notes (Signed)
LCSW Group Therapy Note  10/26/2020 4:17 PM  Type of Therapy/Topic:  Group Therapy:  Feelings about Diagnosis  Participation Level:  Active   Description of Group:   This group will allow patients to explore their thoughts and feelings about diagnoses they have received. Patients will be guided to explore their level of understanding and acceptance of these diagnoses. Facilitator will encourage patients to process their thoughts and feelings about the reactions of others to their diagnosis and will guide patients in identifying ways to discuss their diagnosis with significant others in their lives. This group will be process-oriented, with patients participating in exploration of their own experiences, giving and receiving support, and processing challenge from other group members.   Therapeutic Goals: Patient will demonstrate understanding of diagnosis as evidenced by identifying two or more symptoms of the disorder Patient will be able to express two feelings regarding the diagnosis Patient will demonstrate their ability to communicate their needs through discussion and/or role play  Summary of Patient Progress: Patient was presented to group late. Patient was an active listener and participated in the topic of discussion, provided helpful advice to others, and added nuance to topic of conversation. Patient was dominating in conversation at times, though noticeably attempted to control himself. Patient has improved in group setting since being allowed to participate. Patient shared that his mental illness has been isolating; [name] allowed others in the group to reflect sentiments and provide constructive feedback.     Therapeutic Modalities:   Cognitive Behavioral Therapy Brief Therapy Feelings Identification   Gwenevere Ghazi, MSW, [place], Bridget Hartshorn 10/26/2020 4:17 PM

## 2020-10-26 NOTE — Progress Notes (Signed)
Patient this morning with review of POC/CBG taken by nightshift BHT, observed to have POC/CBG of 403. Patient attending Dr. Neale Burly notified with additional care orders given.

## 2020-10-26 NOTE — Progress Notes (Signed)
   10/26/20 1425  Clinical Encounter Type  Visited With Patient  Visit Type Follow-up;Spiritual support;Social support  Referral From Other (Comment) (rounding)  Spiritual Encounters  Spiritual Needs Sacred text;Other (Comment) (reading aid)  Chaplain Burris checked-in with Mr. Doom who was pleased with the reading material shared with him on 6/13. Luna Fuse has offered to locate some additional resource to facilitate his reading of the Quran but did not have that material today. Mr. Ursua seemed appreciative and welcoming. Will follow-up on 6/15 in the afternoon.

## 2020-10-26 NOTE — Progress Notes (Signed)
Pt. This morning endorses continued pain in bilateral knees (chronic), but refuses additional intervention for this.

## 2020-10-26 NOTE — Progress Notes (Addendum)
Physical Therapy Treatment Patient Details Name: Dylan Jordan MRN: [id number] DOB: [birth year] Today's Date: 10/26/2020    History of Present Illness      PT Comments    Pt walking in day room independently upon arrival.  RW in room but he chooses not to use it.  He is able to walk for 10 minutes on unit with no AD but with notable limp RLE with one LOB that he recovered on his own by using rail when he turned his head quickly.  He is able to open hand hold door for me x 3 without issue.  Discussed use of RW to assist with balance and relieve knee pain but he declines to use it or trial during session.  Stated he prefers [facility] but given he is on BM unit they are discouraged "It's not like I'm going to hit someone with it."     Follow Up Recommendations  No PT follow up     Equipment Recommendations  None recommended by PT    Recommendations for Other Services       Precautions / Restrictions Precautions Precautions: Fall Restrictions Weight Bearing Restrictions: No    Mobility  Bed Mobility               General bed mobility comments: up in day room upon arrival.  Anticipate no difficulties    Transfers Overall transfer level: Independent               General transfer comment: Pt able to rise w/o direct assist, stabilizes well  Ambulation/Gait Ambulation/Gait assistance: Supervision Gait Distance (Feet): 600 Feet Assistive device: None Gait Pattern/deviations: Wide base of support Gait velocity: decreased, increased lateral sway   General Gait Details: resistant to RW despite encouragement, on LOB with head turn   Stairs             Wheelchair Mobility    Modified Rankin (Stroke Patients Only)       Balance Overall balance assessment: Needs assistance Sitting-balance support: Feet supported Sitting balance-Leahy Scale: Normal     Standing balance support: No upper extremity supported Standing balance-Leahy Scale: Fair                               Cognition Arousal/Alertness: Awake/alert Behavior During Therapy: WFL for tasks assessed/performed Overall Cognitive Status: Within Functional Limits for tasks assessed                                 General Comments: Pt with history of agitation in BH unit, does not display any during PT exam      Exercises      General Comments        Pertinent Vitals/Pain Pain Assessment: Faces Faces Pain Scale: Hurts a little bit Pain Location: 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 Pain Descriptors / Indicators: Sore Pain Intervention(s): Monitored during session    Home Living                      Prior Function            PT Goals (current goals can now be found in the care plan section) Progress towards PT goals: Progressing toward goals    Frequency    Min 2X/week      PT Plan      Co-evaluation  AM-PAC PT "6 Clicks" Mobility   Outcome Measure  Help needed turning from your back to your side while in a flat bed without using bedrails?: None Help needed moving from lying on your back to sitting on the side of a flat bed without using bedrails?: None Help needed moving to and from a bed to a chair (including a wheelchair)?: None Help needed standing up from a chair using your arms (e.g., wheelchair or bedside chair)?: None Help needed to walk in hospital room?: None Help needed climbing 3-5 steps with a railing? : None 6 Click Score: 24    End of Session Equipment Utilized During Treatment: Gait belt Activity Tolerance: Patient tolerated treatment well Patient left: in bed Nurse Communication: Mobility status PT Visit Diagnosis: Unsteadiness on feet (R26.81)     Time: 5885-0277 PT Time Calculation (min) (ACUTE ONLY): 10 min  Charges:  $Gait Training: 8-22 mins                    Danielle Dess, PTA 10/26/20, 2:33 PM , 2:30 PM

## 2020-10-26 NOTE — Progress Notes (Addendum)
Spoke to Dr. Neale Burly about lunch coming to unit late after blood sugar POC collected and insulin not delivered from pharmacy and available on the unit. Per attending, patient can have calculated meal time coverage per meal time coverage POC/CBG. Pt. Given without complication.

## 2020-10-26 NOTE — Progress Notes (Signed)
Recreation Therapy Notes   Date: 10/26/2020  Time: 10:00 am   Location: Craft room    Behavioral response: Appropriate  Intervention Topic:  Time Management   Discussion/Intervention:  Group content today was focused on time management. The group defined time management and identified healthy ways to manage time. Individuals expressed how much of the 24 hours they use in a day. Patients expressed how much time they use just for themselves personally. The group expressed how they have managed their time in the past. Individuals participated in the intervention "Managing Life" where they had a chance to see how much of the 24 hours they use and where it goes. Clinical Observations/Feedback: Patient came to group and defined time management as taking your time and managing it. He stated that he manages his time by having an objective, reading and watching television. Participant express that time management is important to sharpen up awareness of time. Individual was social with staff and peers while participating in the intervention. Cookie Pore LRT/CTRS        Faith Patricelli 10/26/2020 11:32 AM        Cord Wilczynski 10/26/2020 11:32 AM        Brixon Zhen 10/26/2020 11:32 AM

## 2020-10-26 NOTE — Progress Notes (Signed)
[facility] MD Progress Note  10/26/2020 2:42 PM Dylan Jordan  MRN:  [id number]  CC " I feel better."  Subjective:  65 year old male with schizoaffective disorder bipolar type presenting for disorganized behavior, elevated mood, auditory and visual hallucinations, suicidal ideations, and homicidal ideations. No acute events overnight, medication compliant aside from Depakote, ADLs improved.  This morning patient seen one-on-one.  He is much more pleasant today.  He continues to state that he feels he is doing much better on Invega and Zyprexa, and does not wish to take any other medications.  At this time patient appears to be at his psychiatric baseline and is sleeping over 7 hours per night.  We will can discontinue Depakote.  He denies any suicidal ideations, homicidal ideations, visual hallucinations, auditory hallucinations.  At this time the lability of his sugar precludes the safety of his discharge.  Blood glucose levels hallucinations in the 400s this morning.  Time spent going over diabetic carb modified diet.  Patient does request materials to read over on how to better address his oral intake.   Principal Problem: Schizoaffective disorder, bipolar type (HCC) Diagnosis: Principal Problem:   Schizoaffective disorder, bipolar type (HCC) Active Problems:   HTN (hypertension)   Mild persistent asthma   Slow transit constipation   Uncontrolled type 2 diabetes mellitus with hyperglycemia (HCC)  Total Time spent with patient: 30 minutes  Past Psychiatric History: See H&P  Past Medical History:  Past Medical History:  Diagnosis Date   Anemia    Asthma    Diabetes mellitus without complication (HCC)    Gout    Hyperlipidemia    Hyperosmolar hyperglycemic coma due to diabetes mellitus without ketoacidosis (HCC)    Hypertension    Schizophrenia (HCC) 07/06/2018    Past Surgical History:  Procedure Laterality Date   REPLACEMENT TOTAL KNEE Left    TOE SURGERY     Family History:   Family History  Adopted: Yes  Problem Relation Age of Onset   Allergic rhinitis Neg Hx    Angioedema Neg Hx    Asthma Neg Hx    Atopy Neg Hx    Eczema Neg Hx    Immunodeficiency Neg Hx    Urticaria Neg Hx    Family Psychiatric  History: See H&P Social History:  Social History   Substance and Sexual Activity  Alcohol Use No     Social History   Substance and Sexual Activity  Drug Use No    Social History   Socioeconomic History   Marital status: Single    Spouse name: Not on file   Number of children: Not on file   Years of education: Not on file   Highest education level: Not on file  Occupational History   Not on file  Tobacco Use   Smoking status: Some Days    Packs/day: 0.30    Years: 45.00    Pack years: 13.50    Types: Cigarettes    Start date: 05/16/1971   Smokeless tobacco: Never   Tobacco comments:    Decreased Intake to 10 cigarettes per week or less.  Vaping Use   Vaping Use: Never used  Substance and Sexual Activity   Alcohol use: No   Drug use: No   Sexual activity: Not on file  Other Topics Concern   Not on file  Social History Narrative   Lives by himself at home. Closest family member is niece.       ETOH none   Drugs non  e   Current smoker, none for three days    Social Determinants of Health   Financial Resource Strain: Not on file  Food Insecurity: Not on file  Transportation Needs: Not on file  Physical Activity: Not on file  Stress: Not on file  Social Connections: Not on file   Additional Social History:      Sleep: Fair  Appetite:  Fair  Current Medications: Current Facility-Administered Medications  Medication Dose Route Frequency Provider Last Rate Last Admin   acetaminophen (TYLENOL) tablet 650 mg  650 mg Oral Q6H PRN Clapacs, John T, MD   650 mg at 10/26/20 0800   albuterol (VENTOLIN HFA) 108 (90 Base) MCG/ACT inhaler 1-2 puff  1-2 puff Inhalation Q4H PRN Jesse Sans, MD   2 puff at 10/25/20 0759   alum &  mag hydroxide-simeth (MAALOX/MYLANTA) 200-200-20 MG/5ML suspension 30 mL  30 mL Oral Q4H PRN Clapacs, John T, MD       amLODipine (NORVASC) tablet 10 mg  10 mg Oral Daily Clapacs, John T, MD   10 mg at 10/26/20 0738   atorvastatin (LIPITOR) tablet 40 mg  40 mg Oral QHS Jesse Sans, MD   40 mg at 10/25/20 2144   benztropine (COGENTIN) tablet 1 mg  1 mg Oral BID Clapacs, Jackquline Denmark, MD   1 mg at 10/26/20 0739   bisacodyl (DULCOLAX) EC tablet 5 mg  5 mg Oral Daily Jesse Sans, MD   5 mg at 10/26/20 0738   clindamycin (CLEOCIN) capsule 300 mg  300 mg Oral Q6H Jesse Sans, MD   300 mg at 10/26/20 0800   desmopressin (DDAVP) tablet 0.2 mg  0.2 mg Oral QHS Clapacs, John T, MD   0.2 mg at 10/25/20 2144   diphenhydrAMINE (BENADRYL) capsule 25 mg  25 mg Oral Q6H PRN Beverly Sessions, MD   25 mg at 10/25/20 2147   ferrous sulfate tablet 325 mg  325 mg Oral QODAY Clapacs, Jackquline Denmark, MD   325 mg at 10/26/20 0934   gabapentin (NEURONTIN) capsule 600 mg  600 mg Oral TID Jesse Sans, MD   600 mg at 10/26/20 1231   insulin aspart (novoLOG) injection 0-15 Units  0-15 Units Subcutaneous TID WC Jesse Sans, MD   3 Units at 10/26/20 1254   insulin aspart (novoLOG) injection 0-5 Units  0-5 Units Subcutaneous QHS Jesse Sans, MD   3 Units at 10/24/20 2216   insulin aspart (novoLOG) injection 7 Units  7 Units Subcutaneous TID WC Jesse Sans, MD   7 Units at 10/26/20 1255   insulin glargine (LANTUS) injection 35 Units  35 Units Subcutaneous Daily Jesse Sans, MD   35 Units at 10/26/20 0740   lidocaine (XYLOCAINE) 5 % ointment   Topical BID PRN Jesse Sans, MD       lisinopril (ZESTRIL) tablet 15 mg  15 mg Oral Daily Jesse Sans, MD   15 mg at 10/26/20 4098   magnesium hydroxide (MILK OF MAGNESIA) suspension 30 mL  30 mL Oral Daily PRN Clapacs, Jackquline Denmark, MD       melatonin tablet 5 mg  5 mg Oral QHS Clapacs, John T, MD   5 mg at 10/25/20 2142   metFORMIN (GLUCOPHAGE-XR) 24 hr tablet  1,000 mg  1,000 mg Oral Q breakfast Clapacs, Jackquline Denmark, MD   1,000 mg at 10/26/20 0738   nicotine polacrilex (NICORETTE) gum 2 mg  2 mg Oral Q4H  PRN Jesse SansFreeman, Arianna Haydon M, MD       OLANZapine [employer](ZYPREXA) tablet 10 mg  10 mg Oral QHS Jesse SansFreeman, Ossie Beltran M, MD       paliperidone [facility](INVEGA SUSTENNA) injection 234 mg  234 mg Intramuscular Once Jesse SansFreeman, Taz Vanness M, MD       paliperidone (INVEGA) 24 hr tablet 9 mg  9 mg Oral Daily Jesse SansFreeman, Rolla Kedzierski M, MD   9 mg at 10/26/20 0738   pantoprazole (PROTONIX) EC tablet 40 mg  40 mg Oral Daily Clapacs, Jackquline DenmarkJohn T, MD   40 mg at 10/26/20 0738   traZODone (DESYREL) tablet 100 mg  100 mg Oral QHS Beverly SessionsSubedi, Jagannath, MD   100 mg at 10/25/20 2145   ziprasidone (GEODON) injection 20 mg  20 mg Intramuscular Q8H PRN Jesse SansFreeman, Breeann Reposa M, MD   20 mg at 10/22/20 1324    Lab Results:  Results for orders placed or performed during the hospital encounter of 10/20/20 (from the past 48 hour(s))  Glucose, capillary     Status: Abnormal   Collection Time: 10/24/20  4:11 PM  Result Value Ref Range   Glucose-Capillary 163 (H) 70 - 99 mg/dL    Comment: Glucose reference range applies only to samples taken after fasting for at least 8 hours.  Glucose, capillary     Status: Abnormal   Collection Time: 10/24/20  7:56 PM  Result Value Ref Range   Glucose-Capillary 280 (H) 70 - 99 mg/dL    Comment: Glucose reference range applies only to samples taken after fasting for at least 8 hours.  Glucose, capillary     Status: Abnormal   Collection Time: 10/25/20  7:09 AM  Result Value Ref Range   Glucose-Capillary 311 (H) 70 - 99 mg/dL    Comment: Glucose reference range applies only to samples taken after fasting for at least 8 hours.  Glucose, capillary     Status: Abnormal   Collection Time: 10/25/20 11:41 AM  Result Value Ref Range   Glucose-Capillary 260 (H) 70 - 99 mg/dL    Comment: Glucose reference range applies only to samples taken after fasting for at least 8 hours.   Comment 1 Notify RN   Glucose,  capillary     Status: Abnormal   Collection Time: 10/25/20  4:25 PM  Result Value Ref Range   Glucose-Capillary 107 (H) 70 - 99 mg/dL    Comment: Glucose reference range applies only to samples taken after fasting for at least 8 hours.   Comment 1 Notify RN   Glucose, capillary     Status: Abnormal   Collection Time: 10/25/20  8:42 PM  Result Value Ref Range   Glucose-Capillary 191 (H) 70 - 99 mg/dL    Comment: Glucose reference range applies only to samples taken after fasting for at least 8 hours.  Glucose, capillary     Status: Abnormal   Collection Time: 10/26/20  7:00 AM  Result Value Ref Range   Glucose-Capillary 403 (H) 70 - 99 mg/dL    Comment: Glucose reference range applies only to samples taken after fasting for at least 8 hours.  Basic metabolic panel     Status: Abnormal   Collection Time: 10/26/20  9:40 AM  Result Value Ref Range   Sodium 133 (L) 135 - 145 mmol/L   Potassium 5.1 3.5 - 5.1 mmol/L   Chloride 106 98 - 111 mmol/L   CO2 20 (L) 22 - 32 mmol/L   Glucose, Bld 306 (H) 70 - 99 mg/dL  Comment: Glucose reference range applies only to samples taken after fasting for at least 8 hours.   BUN 28 (H) 8 - 23 mg/dL   Creatinine, Ser 4.78 (H) 0.61 - 1.24 mg/dL   Calcium 9.0 8.9 - 29.5 mg/dL   GFR, Estimated 60 (L) >60 mL/min    Comment: (NOTE) Calculated using the CKD-EPI Creatinine Equation (2021)    Anion gap 7 5 - 15    Comment: Performed at [facility], [address] [place], [place] [address]  Glucose, capillary     Status: Abnormal   Collection Time: 10/26/20 11:13 AM  Result Value Ref Range   Glucose-Capillary 197 (H) 70 - 99 mg/dL    Comment: Glucose reference range applies only to samples taken after fasting for at least 8 hours.    Blood Alcohol level:  Lab Results  Component Value Date   ETH <10 10/17/2020   ETH <10 10/16/2020    Metabolic Disorder Labs: Lab Results  Component Value Date   HGBA1C 13.6 (H) 10/17/2020   MPG 344  10/17/2020   MPG 197.25 03/15/2020   No results found for: PROLACTIN Lab Results  Component Value Date   CHOL 206 (H) 10/21/2020   TRIG 367 (H) 10/21/2020   HDL 35 (L) 10/21/2020   CHOLHDL 5.9 10/21/2020   VLDL 73 (H) 10/21/2020   LDLCALC 98 10/21/2020   LDLCALC 77 03/18/2018    Physical Findings: AIMS: Facial and Oral Movements Muscles of Facial Expression: None, normal Lips and Perioral Area: None, normal Jaw: None, normal Tongue: None, normal,Extremity Movements Upper (arms, wrists, hands, fingers): None, normal Lower (legs, knees, ankles, toes): None, normal, Trunk Movements Neck, shoulders, hips: None, normal, Overall Severity Severity of abnormal movements (highest score from questions above): None, normal Incapacitation due to abnormal movements: None, normal Patient's awareness of abnormal movements (rate only patient's report): No Awareness, Dental Status Current problems with teeth and/or dentures?: Yes Does patient usually wear dentures?: No  CIWA:    COWS:     Musculoskeletal: Strength & Muscle Tone: within normal limits Gait & Station: normal Patient leans: N/A  Psychiatric Specialty Exam:  Presentation  General Appearance: Casual  Eye Contact:Good  Speech:Normal Rate  Speech Volume:Increased  Handedness:Right   Mood and Affect  Mood:Euthymic  Affect:Congruent   Thought Process  Thought Processes:Coherent  Descriptions of Associations:Intact  Orientation:Full (Time, Place and Person)  Thought Content:Perseveration  History of Schizophrenia/Schizoaffective disorder:Yes  Duration of Psychotic Symptoms:Greater than six months  Hallucinations:Denies Ideas of Reference: None Suicidal Thoughts:Denies Homicidal Thoughts:Denies  Sensorium  Memory:Immediate Fair; Recent Fair; Remote Fair  Judgment:Intact  Insight:Present   Executive Functions  Concentration:Fair  Attention Span:Fair  Recall:Fair  Fund of  Knowledge:Fair  Language:Fair   Psychomotor Activity  Psychomotor Activity:Psychomotor Activity: Normal   Assets  Assets:Communication Skills; Desire for Improvement; Financial Resources/Insurance; Resilience; Social Support   Sleep  Sleep:Fair, 7.75 hours   Physical Exam: Physical Exam ROS Blood pressure (!) 148/84, pulse 84, temperature 98.7 F (37.1 C), temperature source Oral, resp. rate 17, height 6\' 4"  (1.93 m), weight 103.2 kg, SpO2 100 %. Body mass index is 27.69 kg/m.   Treatment Plan Summary: Daily contact with patient to assess and evaluate symptoms and progress in treatment and Medication management 1.  Schizoaffective disorder, bipolar type - Discontinue Depakote due to medication noncompliance --Continue Invega 9 mg nightly, patient declines LAI - Will schedule Zyprexa 10 mg QHS as patient has received this nightly. Zyprexa not ideal given his uncontrolled diabetes, however,  this is the only medication he will agree to take.  He has failed monotherapy with Invega, Zyprexa, Haldol, Risperdal, and Geodon.  He was on Clazuril in the past, but refuses all blood work making it impossible to continue administering this medication.  He has also failed a 12 session index course of ECT. -Continue Cogentin 1 mg twice a day for EPS prophylaxis   2.  Hypertension - Continue amlodipine 10 mg daily, lisinopril 15 mg daily   3.  Hyperlipidemia - Continue Lipitor 40 mg daily at bedtime   4.  Constipation - Dulcolax 5 mg daily   5.  Diabetes mellitus, uncontrolled - Appreciate input from diabetic coordinator.  Continue metformin 1000 mg daily, continue Lantus 35  mg daily.  NovoLog 7 units 3 times daily with meals in addition to correctional insulin with meals and bedtime.   Hemoglobin A1c 13.6 at outside hospital.  Continue gabapentin 600 mg 3 times a day for neuropathic pain   6.  Tobacco use disorder -Nicorette gum   7.  GERD -Protonix 40 mg daily  10/26/20:  Psychiatric exam above reviewed and remains accurate. Assessment and plan above reviewed and updated.     Jesse Sans, MD 10/26/2020, 2:42 PM

## 2020-10-26 NOTE — Plan of Care (Addendum)
Patient during assessments this morning presents appreciably with a dysphoric, agitated, and disorganized interpersonal style. Pt. Attempted to be given all of his morning medications, but was only selectively complaint (I.e., refused his depakote). Pt. Thought process appreciably disorganized with religious themes. Pt. During our engagement was only minimally cooperative and willing to engage, but denied si/hi/avh, and denied safety concerns on the unit. Pt. Endorsed physical pain that he states is chronic in bilateral knees, given PRN medicine for this. Pt. Asked about sleeping and eating but refused to answer. Pt. Refused to answer about toleration of his medicines.   Patient has been only selectively complaint with medications and unit procedures thus far. Pt. Has been observed eating good thus far, observed up eating all of his breakfast. Pt. Has been able to remain safe on the unit thus far. Pt. Thus far mostly observed pacing around the unit talking to himself, potentially internally preoccupied, though it is unclear. Pt. Blood sugars monitored per md orders.  Q x 15 minute observation checks in place/maintained for safety. Patient is provided with education throughout shift when appropriate and able.  Patient is given/offered medications per orders. Patient is encouraged to attend groups, participate in unit activities and continue with plan of care. Pt. Chart and plans of care reviewed. Pt. Given support and encouragement when appropriate and able.     Problem: Health Behavior/Discharge Planning: Goal: Compliance with treatment plan for underlying cause of condition will improve Outcome: Not Progressing Note: Patient only selectively complaint with medications.    Problem: Safety: Goal: Periods of time without injury will increase Outcome: Progressing Note: Patient has been able to remain safe on the unit thus far.    Problem: Education: Goal: Will be free of psychotic symptoms Outcome:  Not Progressing Note: Patient continues to present behaviorally disorganized, and disorganized in his thought process, with religious themes.

## 2020-10-27 LAB — GLUCOSE, CAPILLARY
Glucose-Capillary: 290 mg/dL — ABNORMAL HIGH (ref 70–99)
Glucose-Capillary: 244 mg/dL — ABNORMAL HIGH (ref 70–99)
Glucose-Capillary: 81 mg/dL (ref 70–99)
Glucose-Capillary: 165 mg/dL — ABNORMAL HIGH (ref 70–99)

## 2020-10-27 MED ORDER — INSULIN ASPART 100 UNIT/ML IJ SOLN
7.0000 [IU] | Freq: Once | INTRAMUSCULAR | Status: AC
  Administered 2020-10-27: 7 [IU] via SUBCUTANEOUS
  Filled 2020-10-27: qty 1

## 2020-10-27 MED ORDER — CAMPHOR-MENTHOL 0.5-0.5 % EX LOTN
TOPICAL_LOTION | CUTANEOUS | Status: DC | PRN
  Filled 2020-10-27: qty 222

## 2020-10-27 MED ORDER — INSULIN GLARGINE 100 UNIT/ML ~~LOC~~ SOLN
40.0000 [IU] | Freq: Every day | SUBCUTANEOUS | Status: DC
  Administered 2020-10-28: 40 [IU] via SUBCUTANEOUS
  Filled 2020-10-27: qty 0.4

## 2020-10-27 MED ORDER — NICOTINE 21 MG/24HR TD PT24
21.0000 mg | MEDICATED_PATCH | Freq: Every day | TRANSDERMAL | Status: DC
  Administered 2020-10-27 – 2020-10-28 (×2): 21 mg via TRANSDERMAL
  Filled 2020-10-27 (×2): qty 1

## 2020-10-27 MED ORDER — INSULIN GLARGINE 100 UNIT/ML ~~LOC~~ SOLN
35.0000 [IU] | Freq: Once | SUBCUTANEOUS | Status: AC
  Administered 2020-10-27: 35 [IU] via SUBCUTANEOUS
  Filled 2020-10-27: qty 0.35

## 2020-10-27 MED ORDER — INSULIN ASPART 100 UNIT/ML IJ SOLN
9.0000 [IU] | Freq: Three times a day (TID) | INTRAMUSCULAR | Status: DC
  Administered 2020-10-27 – 2020-10-28 (×4): 9 [IU] via SUBCUTANEOUS
  Filled 2020-10-27 (×4): qty 1

## 2020-10-27 NOTE — Tx Team (Signed)
Interdisciplinary Treatment and Diagnostic Plan Update  10/27/2020 Time of Session:  8:30AM  Dylan Jordan MRN: [id number]  Principal Diagnosis: Schizoaffective disorder, bipolar type (HCC)  Secondary Diagnoses: Principal Problem:   Schizoaffective disorder, bipolar type (HCC) Active Problems:   HTN (hypertension)   Mild persistent asthma   Slow transit constipation   Uncontrolled type 2 diabetes mellitus with hyperglycemia (HCC)   Current Medications:  Current Facility-Administered Medications  Medication Dose Route Frequency Provider Last Rate Last Admin   acetaminophen (TYLENOL) tablet 650 mg  650 mg Oral Q6H PRN Clapacs, John T, MD   650 mg at 10/27/20 0554   albuterol (VENTOLIN HFA) 108 (90 Base) MCG/ACT inhaler 1-2 puff  1-2 puff Inhalation Q4H PRN Jesse Sans, MD   2 puff at 10/25/20 0759   alum & mag hydroxide-simeth (MAALOX/MYLANTA) 200-200-20 MG/5ML suspension 30 mL  30 mL Oral Q4H PRN Clapacs, John T, MD       amLODipine (NORVASC) tablet 10 mg  10 mg Oral Daily Clapacs, John T, MD   10 mg at 10/27/20 0813   atorvastatin (LIPITOR) tablet 40 mg  40 mg Oral QHS Jesse Sans, MD   40 mg at 10/26/20 2147   benztropine (COGENTIN) tablet 1 mg  1 mg Oral BID Clapacs, Jackquline Denmark, MD   1 mg at 10/27/20 0815   bisacodyl (DULCOLAX) EC tablet 5 mg  5 mg Oral Daily Jesse Sans, MD   5 mg at 10/27/20 0815   clindamycin (CLEOCIN) capsule 300 mg  300 mg Oral Q6H Jesse Sans, MD   300 mg at 10/27/20 0277   desmopressin (DDAVP) tablet 0.2 mg  0.2 mg Oral QHS Clapacs, John T, MD   0.2 mg at 10/26/20 2149   diphenhydrAMINE (BENADRYL) capsule 25 mg  25 mg Oral Q6H PRN Beverly Sessions, MD   25 mg at 10/26/20 2149   ferrous sulfate tablet 325 mg  325 mg Oral QODAY Clapacs, Jackquline Denmark, MD   325 mg at 10/26/20 0934   gabapentin (NEURONTIN) capsule 600 mg  600 mg Oral TID Jesse Sans, MD   600 mg at 10/27/20 0815   insulin aspart (novoLOG) injection 0-15 Units  0-15 Units  Subcutaneous TID WC Jesse Sans, MD   8 Units at 10/27/20 0807   insulin aspart (novoLOG) injection 0-5 Units  0-5 Units Subcutaneous QHS Jesse Sans, MD   3 Units at 10/24/20 2216   insulin aspart (novoLOG) injection 9 Units  9 Units Subcutaneous TID WC Jesse Sans, MD       [START ON 10/28/2020] insulin glargine (LANTUS) injection 40 Units  40 Units Subcutaneous Daily Jesse Sans, MD       lidocaine (XYLOCAINE) 5 % ointment   Topical BID PRN Jesse Sans, MD   1 application at 10/26/20 1700   lisinopril (ZESTRIL) tablet 15 mg  15 mg Oral Daily Jesse Sans, MD   15 mg at 10/27/20 0813   magnesium hydroxide (MILK OF MAGNESIA) suspension 30 mL  30 mL Oral Daily PRN Clapacs, Jackquline Denmark, MD       melatonin tablet 5 mg  5 mg Oral QHS Clapacs, John T, MD   5 mg at 10/26/20 2148   metFORMIN (GLUCOPHAGE-XR) 24 hr tablet 1,000 mg  1,000 mg Oral Q breakfast Clapacs, Jackquline Denmark, MD   1,000 mg at 10/27/20 0814   nicotine polacrilex (NICORETTE) gum 2 mg  2 mg Oral Q4H PRN Jesse Sans,  MD       OLANZapine (ZYPREXA) tablet 10 mg  10 mg Oral QHS Jesse SansFreeman, Megan M, MD   10 mg at 10/26/20 2148   paliperidone (INVEGA SUSTENNA) injection 234 mg  234 mg Intramuscular Once Jesse SansFreeman, Megan M, MD       paliperidone (INVEGA) 24 hr tablet 9 mg  9 mg Oral Daily Jesse SansFreeman, Megan M, MD   9 mg at 10/27/20 0814   pantoprazole (PROTONIX) EC tablet 40 mg  40 mg Oral Daily Clapacs, Jackquline DenmarkJohn T, MD   40 mg at 10/27/20 0816   traZODone (DESYREL) tablet 100 mg  100 mg Oral QHS Beverly SessionsSubedi, Jagannath, MD   100 mg at 10/25/20 2145   ziprasidone (GEODON) injection 20 mg  20 mg Intramuscular Q8H PRN Jesse SansFreeman, Megan M, MD   20 mg at 10/22/20 1324   PTA Medications: Medications Prior to Admission  Medication Sig Dispense Refill Last Dose   albuterol (PROVENTIL HFA;VENTOLIN HFA) 108 (90 Base) MCG/ACT inhaler Inhale 1-2 puffs into the lungs every 6 (six) hours as needed for wheezing or shortness of breath. (Patient not taking:  Reported on 10/17/2020) 1 Inhaler 2    amLODipine (NORVASC) 10 MG tablet Take 10 mg by mouth daily. (Patient not taking: Reported on 10/17/2020)      benztropine (COGENTIN) 2 MG tablet Take 1 tablet (2 mg total) by mouth 2 (two) times daily. (Patient not taking: No sig reported) 30 tablet 0    desmopressin (DDAVP) 0.2 MG tablet Take 0.2 mg by mouth at bedtime. (Patient not taking: Reported on 10/17/2020)      divalproex (DEPAKOTE) 500 MG DR tablet Take 1 tablet (500 mg total) by mouth every 8 (eight) hours. (Patient not taking: No sig reported) 90 tablet 0    ferrous sulfate 325 (65 FE) MG EC tablet Take 1 tablet (325 mg total) by mouth every other day. (Patient not taking: Reported on 10/17/2020) 45 tablet 0    gabapentin (NEURONTIN) 600 MG tablet Take 600 mg by mouth 2 (two) times daily. (Patient not taking: No sig reported)      Insulin Glargine (LANTUS SOLOSTAR) 100 UNIT/ML Solostar Pen Inject 35 Units into the skin daily. (Patient not taking: Reported on 10/17/2020) 15 mL 11    insulin lispro (HUMALOG) 100 UNIT/ML injection Inject 0-10 Units into the skin in the morning and at bedtime. Glucose 70-199, 0 units 200-249, 2 units 250-299, 4 units 300-349, 6 units 350-399, 8 units 400-449, 10 units > 450 Call MD (Patient not taking: Reported on 10/17/2020)      melatonin 5 MG TABS Take 5 mg by mouth at bedtime. (Patient not taking: Reported on 10/17/2020)      metformin (FORTAMET) 1000 MG (OSM) 24 hr tablet Take 1 tablet (1,000 mg total) by mouth daily. (Patient not taking: Reported on 10/17/2020) 30 tablet 0    OLANZapine (ZYPREXA) 5 MG tablet Take 1 tablet (5 mg total) by mouth 3 (three) times daily before meals. (Patient not taking: Reported on 10/17/2020) 30 tablet 0    omeprazole (PRILOSEC) 20 MG capsule Take 20 mg by mouth 2 (two) times daily before a meal. (Patient not taking: Reported on 10/17/2020)      polyethylene glycol (MIRALAX / GLYCOLAX) 17 g packet Take 17 g by mouth daily as needed. (Patient not  taking: No sig reported) 14 each 0     Patient Stressors:    Patient Strengths:    Treatment Modalities: Medication Management, Group therapy, Case management,  1  to 1 session with clinician, Psychoeducation, Recreational therapy.   Physician Treatment Plan for Primary Diagnosis: Schizoaffective disorder, bipolar type (HCC) Long Term Goal(s): Improvement in symptoms so as ready for discharge Improvement in symptoms so as ready for discharge   Short Term Goals: Ability to identify changes in lifestyle to reduce recurrence of condition will improve Ability to verbalize feelings will improve Ability to disclose and discuss suicidal ideas Ability to demonstrate self-control will improve Ability to identify and develop effective coping behaviors will improve Ability to maintain clinical measurements within normal limits will improve Compliance with prescribed medications will improve Ability to identify changes in lifestyle to reduce recurrence of condition will improve Ability to verbalize feelings will improve Ability to disclose and discuss suicidal ideas Ability to demonstrate self-control will improve Ability to identify and develop effective coping behaviors will improve Ability to maintain clinical measurements within normal limits will improve Compliance with prescribed medications will improve  Medication Management: Evaluate patient's response, side effects, and tolerance of medication regimen.  Therapeutic Interventions: 1 to 1 sessions, Unit Group sessions and Medication administration.  Evaluation of Outcomes: Progressing  Physician Treatment Plan for Secondary Diagnosis: Principal Problem:   Schizoaffective disorder, bipolar type (HCC) Active Problems:   HTN (hypertension)   Mild persistent asthma   Slow transit constipation   Uncontrolled type 2 diabetes mellitus with hyperglycemia (HCC)  Long Term Goal(s): Improvement in symptoms so as ready for  discharge Improvement in symptoms so as ready for discharge   Short Term Goals: Ability to identify changes in lifestyle to reduce recurrence of condition will improve Ability to verbalize feelings will improve Ability to disclose and discuss suicidal ideas Ability to demonstrate self-control will improve Ability to identify and develop effective coping behaviors will improve Ability to maintain clinical measurements within normal limits will improve Compliance with prescribed medications will improve Ability to identify changes in lifestyle to reduce recurrence of condition will improve Ability to verbalize feelings will improve Ability to disclose and discuss suicidal ideas Ability to demonstrate self-control will improve Ability to identify and develop effective coping behaviors will improve Ability to maintain clinical measurements within normal limits will improve Compliance with prescribed medications will improve     Medication Management: Evaluate patient's response, side effects, and tolerance of medication regimen.  Therapeutic Interventions: 1 to 1 sessions, Unit Group sessions and Medication administration.  Evaluation of Outcomes: Progressing   RN Treatment Plan for Primary Diagnosis: Schizoaffective disorder, bipolar type (HCC) Long Term Goal(s): Knowledge of disease and therapeutic regimen to maintain health will improve  Short Term Goals: Ability to remain free from injury will improve, Ability to verbalize frustration and anger appropriately will improve, Ability to demonstrate self-control, Ability to participate in decision making will improve, Ability to verbalize feelings will improve, Ability to disclose and discuss suicidal ideas, Ability to identify and develop effective coping behaviors will improve, and Compliance with prescribed medications will improve  Medication Management: RN will administer medications as ordered by provider, will assess and evaluate  patient's response and provide education to patient for prescribed medication. RN will report any adverse and/or side effects to prescribing provider.  Therapeutic Interventions: 1 on 1 counseling sessions, Psychoeducation, Medication administration, Evaluate responses to treatment, Monitor vital signs and CBGs as ordered, Perform/monitor CIWA, COWS, AIMS and Fall Risk screenings as ordered, Perform wound care treatments as ordered.  Evaluation of Outcomes: Progressing   LCSW Treatment Plan for Primary Diagnosis: Schizoaffective disorder, bipolar type (HCC) Long Term Goal(s): Safe transition to appropriate  next level of care at discharge, Engage patient in therapeutic group addressing interpersonal concerns.  Short Term Goals: Engage patient in aftercare planning with referrals and resources, Increase social support, Increase ability to appropriately verbalize feelings, Increase emotional regulation, Facilitate acceptance of mental health diagnosis and concerns, Facilitate patient progression through stages of change regarding substance use diagnoses and concerns, Identify triggers associated with mental health/substance abuse issues, and Increase skills for wellness and recovery  Therapeutic Interventions: Assess for all discharge needs, 1 to 1 time with Social worker, Explore available resources and support systems, Assess for adequacy in community support network, Educate family and significant other(s) on suicide prevention, Complete Psychosocial Assessment, Interpersonal group therapy.  Evaluation of Outcomes: Progressing   Progress in Treatment: Attending groups: Yes. Participating in groups: Yes. Taking medication as prescribed: Yes. Toleration medication: Yes. Family/Significant other contact made: No, will contact:  pt declined collateral contact.  SPE completed with the patient.   Patient understands diagnosis: Yes. Discussing patient identified problems/goals with staff:  Yes. Medical problems stabilized or resolved: Yes. Denies suicidal/homicidal ideation: Yes. Issues/concerns per patient self-inventory: No. Other: None  New problem(s) identified: No, Describe:  None  New Short Term/Long Term Goal(s): detox, elimination of symptoms of psychosis, medication management for mood stabilization; elimination of SI thoughts; development of comprehensive mental wellness/sobriety plan. Update 10/27/2020:  elimination of symptoms of psychosis, medication management for mood stabilization; elimination of SI thoughts; development of comprehensive mental wellness/sobriety plan.   Patient Goals:  "I want my teeth and knees fixed...housing, adjustments to my social security and funding, and cataracts removed" Update 10/27/2020: No changes at this time.  Discharge Plan or Barriers: [name] will assist pt with appropriate discharge plan.  Update 10/27/2020: [name] team has been in contact with the patient's ACT team and assisting in development of a plan for discharge.   Reason for Continuation of Hospitalization: Aggression Delusions  Hallucinations Man[facility] Medical Issues Medication stabilization  Estimated Length of Stay: 1- 7 days  Recreational Therapy: Patient Stressors: N/A Patient Goal: Patient will engage in groups without prompting or encouragement from LRT x3 group sessions within 5 recreation therapy group sessions.  Attendees: Patient:  10/27/2020 11:01 AM  Physician: Jesse Sans, MD  10/27/2020 11:01 AM  Nursing:   10/27/2020 11:01 AM  RN Care Manager: 10/27/2020 11:01 AM  Social Worker: Penni Homans, MSW, LCSW   10/27/2020 11:01 AM  Recreational Therapist:  10/27/2020 11:01 AM  Other:   10/27/2020 11:01 AM  Other:   10/27/2020 11:01 AM  Other: 10/27/2020 11:01 AM    Scribe for Treatment Team: Harden Mo, LCSW 10/27/2020 11:01 AM

## 2020-10-27 NOTE — [facility] Group Notes (Signed)
LCSW Group Therapy Note  10/27/2020 12:26 PM  Type of Therapy/Topic:  Group Therapy:  Emotion Regulation  Participation Level:  Minimal   Description of Group:   The purpose of this group is to assist patients in learning to regulate negative emotions and experience positive emotions. Patients will be guided to discuss ways in which they have been vulnerable to their negative emotions. These vulnerabilities will be juxtaposed with experiences of positive emotions or situations, and patients will be challenged to use positive emotions to combat negative ones. Special emphasis will be placed on coping with negative emotions in conflict situations, and patients will process healthy conflict resolution skills.  Therapeutic Goals: Patient will identify two positive emotions or experiences to reflect on in order to balance out negative emotions Patient will label two or more emotions that they find the most difficult to experience Patient will demonstrate positive conflict resolution skills through discussion and/or role plays  Summary of Patient Progress: Patient was present in group.  Patient was often off topic and had to be redirected.  Patient was often difficult to understand.  [name] had to stop the patient from talking over others and interrupting and allow others to talk. Patient became upset at this and had to be escorted from group.    Therapeutic Modalities:   Cognitive Behavioral Therapy Feelings Identification Dialectical Behavioral Therapy  Penni Homans, MSW, LCSW 10/27/2020 12:26 PM

## 2020-10-27 NOTE — Progress Notes (Signed)
Inpatient Diabetes Program Recommendations  AACE/ADA: New Consensus Statement on Inpatient Glycemic Control   Target Ranges:  Prepandial:   less than 140 mg/dL      Peak postprandial:   less than 180 mg/dL (1-2 hours)      Critically ill patients:  140 - 180 mg/dL   Results for NERO, [id number] (MRN [id number]) as of 10/27/2020 07:56  Ref. Range 10/26/2020 07:00 10/26/2020 11:13 10/26/2020 16:25 10/26/2020 20:41 10/27/2020 07:06  Glucose-Capillary Latest Ref Range: 70 - 99 mg/dL 782 (H) 423 (H) 536 (H) 160 (H) 290 (H)    Review of Glycemic Control  Current orders for Inpatient glycemic control: Lantus 35 units daily, Novolog 0-15 units TID with meals, Novolog 0-5 units QHS, Novolog 7 units TID with meals, Metformin XR 1000 mg QAM   Inpatient Diabetes Program Recommendations:     Insulin: Please consider increasing Lantus to 40 units daily and meal coverage to Novolog 9 units TID with meals.   Thanks, Orlando Penner, RN, MSN, CDE Diabetes Coordinator Inpatient Diabetes Program [phone] (Team Pager from 8am to 5pm)

## 2020-10-27 NOTE — Progress Notes (Signed)
Patient alert and oriented x 3, affect is blunted, his thoughts are disorganized, speech is tangential and slurred, he appears irritable, angry and argumentative, he was intrusive at the nurses station with multiple request. Patient currently denies SI/HI/AVH, he is not receptive to staff and does not follow staff advice on snack recommendations. 15 minutes safety checks maintained will continue to monitor .

## 2020-10-27 NOTE — Plan of Care (Signed)
Pt denies depression, anxiety, SI, HI and AVH. Pt was educated on care plan and verbalizes understanding. Pt is irritable. Torrie Mayers RN Problem: Education: Goal: Knowledge of [facility] Education information/materials will improve Outcome: Progressing Goal: Emotional status will improve Outcome: Progressing Goal: Mental status will improve Outcome: Progressing Goal: Verbalization of understanding the information provided will improve Outcome: Progressing   Problem: Activity: Goal: Interest or engagement in activities will improve Outcome: Progressing Goal: Sleeping patterns will improve Outcome: Progressing   Problem: Coping: Goal: Ability to verbalize frustrations and anger appropriately will improve Outcome: Progressing Goal: Ability to demonstrate self-control will improve Outcome: Progressing   Problem: Health Behavior/Discharge Planning: Goal: Identification of resources available to assist in meeting health care needs will improve Outcome: Progressing Goal: Compliance with treatment plan for underlying cause of condition will improve Outcome: Progressing   Problem: Physical Regulation: Goal: Ability to maintain clinical measurements within normal limits will improve Outcome: Progressing   Problem: Safety: Goal: Periods of time without injury will increase Outcome: Progressing   Problem: Activity: Goal: Will verbalize the importance of balancing activity with adequate rest periods Outcome: Progressing   Problem: Education: Goal: Will be free of psychotic symptoms Outcome: Progressing Goal: Knowledge of the prescribed therapeutic regimen will improve Outcome: Progressing   Problem: Coping: Goal: Coping ability will improve Outcome: Progressing Goal: Will verbalize feelings Outcome: Progressing   Problem: Health Behavior/Discharge Planning: Goal: Compliance with prescribed medication regimen will improve Outcome: Progressing   Problem:  Nutritional: Goal: Ability to achieve adequate nutritional intake will improve Outcome: Progressing   Problem: Role Relationship: Goal: Ability to communicate needs accurately will improve Outcome: Progressing Goal: Ability to interact with others will improve Outcome: Progressing   Problem: Safety: Goal: Ability to redirect hostility and anger into socially appropriate behaviors will improve Outcome: Progressing Goal: Ability to remain free from injury will improve Outcome: Progressing   Problem: Self-Care: Goal: Ability to participate in self-care as condition permits will improve Outcome: Progressing   Problem: Self-Concept: Goal: Will verbalize positive feelings about self Outcome: Progressing

## 2020-10-27 NOTE — Progress Notes (Signed)
Recreation Therapy Notes   Date: 10/27/2020  Time: 10:00 am   Location: Craft room    Behavioral response: Appropriate  Intervention Topic:  Problem Solving    Discussion/Intervention:  Group content on today was focused on problem solving. The group described what problem solving is. Patients expressed how problems affect them and how they deal with problems. Individuals identified healthy ways to deal with problems. Patients explained what normally happens to them when they do not deal with problems. The group expressed reoccurring problems for them. The group participated in the intervention "Ways to Solve problems" where patients were given a chance to explore different ways to solve problems.  Clinical Observations/Feedback: Patient came to group late and was focused on what peers and staff had to say about problem solving. Individual was social with staff and peers while participating in the intervention. Dylan Jordan LRT/CTRS         [name] 10/27/2020 12:30 PM

## 2020-10-27 NOTE — Progress Notes (Signed)
Pt has been cooperative, med compliant and less irritable towards the second half of the day.Pt has no complaints. He went outside and attended groups. Torrie Mayers RN

## 2020-10-27 NOTE — Progress Notes (Signed)
[facility] MD Progress Note  10/27/2020 2:22 PM Dylan Jordan  MRN:  [id number]  CC "Can I get some Biofreeze?"  Subjective:  65 year old male with schizoaffective disorder bipolar type presenting for disorganized behavior, elevated mood, auditory and visual hallucinations, suicidal ideations, and homicidal ideations. No acute events overnight, medication compliant aside from Depakote, ADLs improved.  This morning patient seen one-on-one.  He endorses some bilateral knee pain that is worse on the right.  He notes he does not want any narcotics or anything that can be addictive.  He requests Biofreeze that he uses over-the-counter.  He did not feel the lidocaine gel was very helpful.  He also notes he would like to have the 21 mg NicoDerm patch as he feels this works better for him.  Aside from this he denies any other complaints.  He denies suicidal ideations, homicidal ideations, visual hallucinations, auditory hallucinations.  His housing coordinator from his ACT team is coming this afternoon to meet with patient.  Patient does appear to be at his psychiatric baseline. His blood glucose levels continue to be quite elevated but less so than yesterday.  Appreciate diabetic coordinator input, will adjust Lantus and NovoLog accordingly.   Principal Problem: Schizoaffective disorder, bipolar type (HCC) Diagnosis: Principal Problem:   Schizoaffective disorder, bipolar type (HCC) Active Problems:   HTN (hypertension)   Mild persistent asthma   Slow transit constipation   Uncontrolled type 2 diabetes mellitus with hyperglycemia (HCC)  Total Time spent with patient: 30 minutes  Past Psychiatric History: See H&P  Past Medical History:  Past Medical History:  Diagnosis Date   Anemia    Asthma    Diabetes mellitus without complication (HCC)    Gout    Hyperlipidemia    Hyperosmolar hyperglycemic coma due to diabetes mellitus without ketoacidosis (HCC)    Hypertension    Schizophrenia (HCC) 07/06/2018     Past Surgical History:  Procedure Laterality Date   REPLACEMENT TOTAL KNEE Left    TOE SURGERY     Family History:  Family History  Adopted: Yes  Problem Relation Age of Onset   Allergic rhinitis Neg Hx    Angioedema Neg Hx    Asthma Neg Hx    Atopy Neg Hx    Eczema Neg Hx    Immunodeficiency Neg Hx    Urticaria Neg Hx    Family Psychiatric  History: See H&P Social History:  Social History   Substance and Sexual Activity  Alcohol Use No     Social History   Substance and Sexual Activity  Drug Use No    Social History   Socioeconomic History   Marital status: Single    Spouse name: Not on file   Number of children: Not on file   Years of education: Not on file   Highest education level: Not on file  Occupational History   Not on file  Tobacco Use   Smoking status: Some Days    Packs/day: 0.30    Years: 45.00    Pack years: 13.50    Types: Cigarettes    Start date: 05/16/1971   Smokeless tobacco: Never   Tobacco comments:    Decreased Intake to 10 cigarettes per week or less.  Vaping Use   Vaping Use: Never used  Substance and Sexual Activity   Alcohol use: No   Drug use: No   Sexual activity: Not on file  Other Topics Concern   Not on file  Social History Narrative   Lives by  himself at home. Closest family member is niece.       ETOH none   Drugs non e   Current smoker, none for three days    Social Determinants of Health   Financial Resource Strain: Not on file  Food Insecurity: Not on file  Transportation Needs: Not on file  Physical Activity: Not on file  Stress: Not on file  Social Connections: Not on file   Additional Social History:      Sleep: Fair  Appetite:  Fair  Current Medications: Current Facility-Administered Medications  Medication Dose Route Frequency Provider Last Rate Last Admin   acetaminophen (TYLENOL) tablet 650 mg  650 mg Oral Q6H PRN Clapacs, John T, MD   650 mg at 10/27/20 0554   albuterol (VENTOLIN HFA)  108 (90 Base) MCG/ACT inhaler 1-2 puff  1-2 puff Inhalation Q4H PRN Jesse Sans, MD   2 puff at 10/25/20 0759   alum & mag hydroxide-simeth (MAALOX/MYLANTA) 200-200-20 MG/5ML suspension 30 mL  30 mL Oral Q4H PRN Clapacs, John T, MD       amLODipine (NORVASC) tablet 10 mg  10 mg Oral Daily Clapacs, John T, MD   10 mg at 10/27/20 0813   atorvastatin (LIPITOR) tablet 40 mg  40 mg Oral QHS Jesse Sans, MD   40 mg at 10/26/20 2147   benztropine (COGENTIN) tablet 1 mg  1 mg Oral BID Clapacs, Jackquline Denmark, MD   1 mg at 10/27/20 0815   bisacodyl (DULCOLAX) EC tablet 5 mg  5 mg Oral Daily Jesse Sans, MD   5 mg at 10/27/20 0815   camphor-menthol (SARNA) lotion   Topical PRN Jesse Sans, MD       clindamycin (CLEOCIN) capsule 300 mg  300 mg Oral Q6H Jesse Sans, MD   300 mg at 10/27/20 8786   desmopressin (DDAVP) tablet 0.2 mg  0.2 mg Oral QHS Clapacs, John T, MD   0.2 mg at 10/26/20 2149   diphenhydrAMINE (BENADRYL) capsule 25 mg  25 mg Oral Q6H PRN Beverly Sessions, MD   25 mg at 10/26/20 2149   ferrous sulfate tablet 325 mg  325 mg Oral QODAY Clapacs, Jackquline Denmark, MD   325 mg at 10/26/20 0934   gabapentin (NEURONTIN) capsule 600 mg  600 mg Oral TID Jesse Sans, MD   600 mg at 10/27/20 1131   insulin aspart (novoLOG) injection 0-15 Units  0-15 Units Subcutaneous TID WC Jesse Sans, MD   5 Units at 10/27/20 1131   insulin aspart (novoLOG) injection 0-5 Units  0-5 Units Subcutaneous QHS Jesse Sans, MD   3 Units at 10/24/20 2216   insulin aspart (novoLOG) injection 9 Units  9 Units Subcutaneous TID WC Jesse Sans, MD   9 Units at 10/27/20 1132   [START ON 10/28/2020] insulin glargine (LANTUS) injection 40 Units  40 Units Subcutaneous Daily Les Pou M, MD       lisinopril (ZESTRIL) tablet 15 mg  15 mg Oral Daily Jesse Sans, MD   15 mg at 10/27/20 0813   magnesium hydroxide (MILK OF MAGNESIA) suspension 30 mL  30 mL Oral Daily PRN Clapacs, Jackquline Denmark, MD        melatonin tablet 5 mg  5 mg Oral QHS Clapacs, John T, MD   5 mg at 10/26/20 2148   metFORMIN (GLUCOPHAGE-XR) 24 hr tablet 1,000 mg  1,000 mg Oral Q breakfast Clapacs, Jackquline Denmark, MD   1,000  mg at 10/27/20 0814   nicotine (NICODERM CQ - dosed in mg/24 hours) patch 21 mg  21 mg Transdermal Daily Jesse SansFreeman, Elyanna Wallick M, MD       OLANZapine [facility](ZYPREXA) tablet 10 mg  10 mg Oral QHS Jesse SansFreeman, Jachob Mcclean M, MD   10 mg at 10/26/20 2148   paliperidone (INVEGA SUSTENNA) injection 234 mg  234 mg Intramuscular Once Jesse SansFreeman, Dexton Zwilling M, MD       paliperidone (INVEGA) 24 hr tablet 9 mg  9 mg Oral Daily Jesse SansFreeman, Margarett Viti M, MD   9 mg at 10/27/20 0814   pantoprazole (PROTONIX) EC tablet 40 mg  40 mg Oral Daily Clapacs, Jackquline DenmarkJohn T, MD   40 mg at 10/27/20 0816   traZODone (DESYREL) tablet 100 mg  100 mg Oral QHS Beverly SessionsSubedi, Jagannath, MD   100 mg at 10/25/20 2145   ziprasidone (GEODON) injection 20 mg  20 mg Intramuscular Q8H PRN Jesse SansFreeman, Jannah Guardiola M, MD   20 mg at 10/22/20 1324    Lab Results:  Results for orders placed or performed during the hospital encounter of 10/20/20 (from the past 48 hour(s))  Glucose, capillary     Status: Abnormal   Collection Time: 10/25/20  4:25 PM  Result Value Ref Range   Glucose-Capillary 107 (H) 70 - 99 mg/dL    Comment: Glucose reference range applies only to samples taken after fasting for at least 8 hours.   Comment 1 Notify RN   Glucose, capillary     Status: Abnormal   Collection Time: 10/25/20  8:42 PM  Result Value Ref Range   Glucose-Capillary 191 (H) 70 - 99 mg/dL    Comment: Glucose reference range applies only to samples taken after fasting for at least 8 hours.  Glucose, capillary     Status: Abnormal   Collection Time: 10/26/20  7:00 AM  Result Value Ref Range   Glucose-Capillary 403 (H) 70 - 99 mg/dL    Comment: Glucose reference range applies only to samples taken after fasting for at least 8 hours.  Basic metabolic panel     Status: Abnormal   Collection Time: 10/26/20  9:40 AM  Result  Value Ref Range   Sodium 133 (L) 135 - 145 mmol/L   Potassium 5.1 3.5 - 5.1 mmol/L   Chloride 106 98 - 111 mmol/L   CO2 20 (L) 22 - 32 mmol/L   Glucose, Bld 306 (H) 70 - 99 mg/dL    Comment: Glucose reference range applies only to samples taken after fasting for at least 8 hours.   BUN 28 (H) 8 - 23 mg/dL   Creatinine, Ser 1.611.33 (H) 0.61 - 1.24 mg/dL   Calcium 9.0 8.9 - 09.610.3 mg/dL   GFR, Estimated 60 (L) >60 mL/min    Comment: (NOTE) Calculated using the CKD-EPI Creatinine Equation (2021)    Anion gap 7 5 - 15    Comment: Performed at [facility], [address] [place], [place] [id number]  Glucose, capillary     Status: Abnormal   Collection Time: 10/26/20 11:13 AM  Result Value Ref Range   Glucose-Capillary 197 (H) 70 - 99 mg/dL    Comment: Glucose reference range applies only to samples taken after fasting for at least 8 hours.  Glucose, capillary     Status: Abnormal   Collection Time: 10/26/20  4:25 PM  Result Value Ref Range   Glucose-Capillary 223 (H) 70 - 99 mg/dL    Comment: Glucose reference range applies only to samples taken  after fasting for at least 8 hours.  Glucose, capillary     Status: Abnormal   Collection Time: 10/26/20  8:41 PM  Result Value Ref Range   Glucose-Capillary 160 (H) 70 - 99 mg/dL    Comment: Glucose reference range applies only to samples taken after fasting for at least 8 hours.  Glucose, capillary     Status: Abnormal   Collection Time: 10/27/20  7:06 AM  Result Value Ref Range   Glucose-Capillary 290 (H) 70 - 99 mg/dL    Comment: Glucose reference range applies only to samples taken after fasting for at least 8 hours.  Glucose, capillary     Status: Abnormal   Collection Time: 10/27/20 11:21 AM  Result Value Ref Range   Glucose-Capillary 244 (H) 70 - 99 mg/dL    Comment: Glucose reference range applies only to samples taken after fasting for at least 8 hours.   Comment 1 Notify RN     Blood Alcohol level:  Lab Results   Component Value Date   ETH <10 10/17/2020   ETH <10 10/16/2020    Metabolic Disorder Labs: Lab Results  Component Value Date   HGBA1C 13.6 (H) 10/17/2020   MPG 344 10/17/2020   MPG 197.25 03/15/2020   No results found for: PROLACTIN Lab Results  Component Value Date   CHOL 206 (H) 10/21/2020   TRIG 367 (H) 10/21/2020   HDL 35 (L) 10/21/2020   CHOLHDL 5.9 10/21/2020   VLDL 73 (H) 10/21/2020   LDLCALC 98 10/21/2020   LDLCALC 77 03/18/2018    Physical Findings: AIMS: Facial and Oral Movements Muscles of Facial Expression: None, normal Lips and Perioral Area: None, normal Jaw: None, normal Tongue: None, normal,Extremity Movements Upper (arms, wrists, hands, fingers): None, normal Lower (legs, knees, ankles, toes): None, normal, Trunk Movements Neck, shoulders, hips: None, normal, Overall Severity Severity of abnormal movements (highest score from questions above): None, normal Incapacitation due to abnormal movements: None, normal Patient's awareness of abnormal movements (rate only patient's report): No Awareness, Dental Status Current problems with teeth and/or dentures?: Yes Does patient usually wear dentures?: No  CIWA:    COWS:     Musculoskeletal: Strength & Muscle Tone: within normal limits Gait & Station: normal Patient leans: N/A  Psychiatric Specialty Exam:  Presentation  General Appearance: Casual  Eye Contact:Good  Speech:Normal Rate  Speech Volume:Increased  Handedness:Right   Mood and Affect  Mood:Euthymic  Affect:Congruent   Thought Process  Thought Processes:Coherent  Descriptions of Associations:Intact  Orientation:Full (Time, Place and Person)  Thought Content:Perseveration  History of Schizophrenia/Schizoaffective disorder:Yes  Duration of Psychotic Symptoms:Greater than six months  Hallucinations:Denies Ideas of Reference: None Suicidal Thoughts:Denies Homicidal Thoughts:Denies  Sensorium  Memory:Immediate Fair;  Recent Fair; Remote Fair  Judgment:Intact  Insight:Present   Executive Functions  Concentration:Fair  Attention Span:Fair  Recall:Fair  Fund of Knowledge:Fair  Language:Fair   Psychomotor Activity  Psychomotor Activity:No data recorded   Assets  Assets:Communication Skills; Desire for Improvement; Financial Resources/Insurance; Resilience; Social Support   Sleep  Sleep:Fair, 7.75 hours   Physical Exam: Physical Exam ROS Blood pressure (!) 143/81, pulse 86, temperature 98 F (36.7 C), temperature source Oral, resp. rate 17, height 6\' 4"  (1.93 m), weight 103.2 kg, SpO2 99 %. Body mass index is 27.69 kg/m.   Treatment Plan Summary: Daily contact with patient to assess and evaluate symptoms and progress in treatment and Medication management 1.  Schizoaffective disorder, bipolar type - Discontinue Depakote due to medication noncompliance --Continue Invega 9  mg nightly, patient declines LAI - Continue Zyprexa 10 mg QHS as patient has received this nightly. Zyprexa not ideal given his uncontrolled diabetes, however, this is the only medication he will agree to take.  He has failed monotherapy with Invega, Zyprexa, Haldol, Risperdal, and Geodon.  He was on Clazuril in the past, but refuses all blood work making it impossible to continue administering this medication.  He has also failed a 12 session index course of ECT. -Continue Cogentin 1 mg twice a day for EPS prophylaxis   2.  Hypertension - Continue amlodipine 10 mg daily, lisinopril 15 mg daily   3.  Hyperlipidemia - Continue Lipitor 40 mg daily at bedtime   4.  Constipation - Dulcolax 5 mg daily   5.  Diabetes mellitus, uncontrolled - Appreciate input from diabetic coordinator.  Continue metformin 1000 mg daily, increase Lantus 40 mg daily.  Increase NovoLog 9 units 3 times daily with meals in addition to correctional insulin with meals and bedtime.   Hemoglobin A1c 13.6 at outside hospital.  Continue  gabapentin 600 mg 3 times a day for neuropathic pain   6.  Tobacco use disorder -Nicorette gum   7.  GERD -Protonix 40 mg daily  10/27/20: Psychiatric exam above reviewed and remains accurate. Assessment and plan above reviewed and updated.      Jesse Sans, MD 10/27/2020, 2:22 PM

## 2020-10-28 ENCOUNTER — Emergency Department
Admission: EM | Admit: 2020-10-28 | Discharge: 2020-10-30 | Disposition: A | Discharge status: Home / Self Care | Payer: Medicaid Other | Attending: Emergency Medicine | Admitting: Emergency Medicine

## 2020-10-28 ENCOUNTER — Other Ambulatory Visit: Payer: Self-pay

## 2020-10-28 DIAGNOSIS — F259 Schizoaffective disorder, unspecified: Secondary | ICD-10-CM | POA: Diagnosis not present

## 2020-10-28 DIAGNOSIS — F17210 Nicotine dependence, cigarettes, uncomplicated: Secondary | ICD-10-CM | POA: Insufficient documentation

## 2020-10-28 DIAGNOSIS — E1140 Type 2 diabetes mellitus with diabetic neuropathy, unspecified: Secondary | ICD-10-CM | POA: Insufficient documentation

## 2020-10-28 DIAGNOSIS — Z7984 Long term (current) use of oral hypoglycemic drugs: Secondary | ICD-10-CM | POA: Insufficient documentation

## 2020-10-28 DIAGNOSIS — Z794 Long term (current) use of insulin: Secondary | ICD-10-CM | POA: Insufficient documentation

## 2020-10-28 DIAGNOSIS — N1831 Chronic kidney disease, stage 3a: Secondary | ICD-10-CM | POA: Insufficient documentation

## 2020-10-28 DIAGNOSIS — I129 Hypertensive chronic kidney disease with stage 1 through stage 4 chronic kidney disease, or unspecified chronic kidney disease: Secondary | ICD-10-CM | POA: Insufficient documentation

## 2020-10-28 DIAGNOSIS — Z5900 Homelessness unspecified: Secondary | ICD-10-CM

## 2020-10-28 DIAGNOSIS — Z96652 Presence of left artificial knee joint: Secondary | ICD-10-CM | POA: Insufficient documentation

## 2020-10-28 DIAGNOSIS — Z79899 Other long term (current) drug therapy: Secondary | ICD-10-CM | POA: Insufficient documentation

## 2020-10-28 DIAGNOSIS — J45909 Unspecified asthma, uncomplicated: Secondary | ICD-10-CM | POA: Insufficient documentation

## 2020-10-28 DIAGNOSIS — E1165 Type 2 diabetes mellitus with hyperglycemia: Secondary | ICD-10-CM | POA: Insufficient documentation

## 2020-10-28 LAB — BASIC METABOLIC PANEL
Anion gap: 6 (ref 5–15)
BUN: 29 mg/dL — ABNORMAL HIGH (ref 8–23)
CO2: 19 mmol/L — ABNORMAL LOW (ref 22–32)
Calcium: 8.8 mg/dL — ABNORMAL LOW (ref 8.9–10.3)
Chloride: 105 mmol/L (ref 98–111)
Creatinine, Ser: 1.07 mg/dL (ref 0.61–1.24)
GFR, Estimated: >60 mL/min (ref >60)
Glucose, Bld: 404 mg/dL — ABNORMAL HIGH (ref 70–99)
Potassium: 5.5 mmol/L — ABNORMAL HIGH (ref 3.5–5.1)
Sodium: 130 mmol/L — ABNORMAL LOW (ref 135–145)

## 2020-10-28 LAB — CBG MONITORING, ED: Glucose-Capillary: 388 mg/dL — ABNORMAL HIGH (ref 70–99)

## 2020-10-28 LAB — GLUCOSE, CAPILLARY
Glucose-Capillary: 238 mg/dL — ABNORMAL HIGH (ref 70–99)
Glucose-Capillary: 77 mg/dL (ref 70–99)

## 2020-10-28 MED ORDER — PALIPERIDONE ER 9 MG PO TB24: 9.0000 mg | ORAL_TABLET | Freq: Every day | ORAL | 1 refills | Status: DC

## 2020-10-28 MED ORDER — INSULIN ASPART 100 UNIT/ML IJ SOLN: 9.0000 [IU] | Freq: Three times a day (TID) | INTRAMUSCULAR | 1 refills | Status: DC

## 2020-10-28 MED ORDER — PANTOPRAZOLE SODIUM 40 MG PO TBEC: 40.0000 mg | DELAYED_RELEASE_TABLET | Freq: Every day | ORAL | 1 refills | Status: DC

## 2020-10-28 MED ORDER — MELATONIN 10 MG PO TABS: 10.0000 mg | ORAL_TABLET | Freq: Every day | ORAL | 1 refills | Status: DC

## 2020-10-28 MED ORDER — OLANZAPINE 10 MG PO TABS: 10.0000 mg | ORAL_TABLET | Freq: Every day | ORAL | 1 refills | Status: DC

## 2020-10-28 MED ORDER — ALBUTEROL SULFATE HFA 108 (90 BASE) MCG/ACT IN AERS: 1.0000 | INHALATION_SPRAY | RESPIRATORY_TRACT | 1 refills | Status: DC | PRN

## 2020-10-28 MED ORDER — INSULIN GLARGINE 100 UNIT/ML ~~LOC~~ SOLN
40.0000 [IU] | Freq: Every day | SUBCUTANEOUS | Status: DC
  Administered 2020-10-29 – 2020-10-30 (×2): 40 [IU] via SUBCUTANEOUS
  Filled 2020-10-28 (×4): qty 0.4

## 2020-10-28 MED ORDER — AMLODIPINE BESYLATE 5 MG PO TABS
10.0000 mg | ORAL_TABLET | Freq: Every day | ORAL | Status: DC
Start: 2020-10-28 — End: 2020-10-30
  Administered 2020-10-28 – 2020-10-30 (×3): 10 mg via ORAL
  Filled 2020-10-28 (×3): qty 2

## 2020-10-28 MED ORDER — OLANZAPINE 10 MG PO TABS
10.0000 mg | ORAL_TABLET | Freq: Every day | ORAL | Status: DC
  Administered 2020-10-29 – 2020-10-30 (×2): 10 mg via ORAL
  Filled 2020-10-28 (×2): qty 1

## 2020-10-28 MED ORDER — INSULIN ASPART 100 UNIT/ML IJ SOLN
9.0000 [IU] | Freq: Three times a day (TID) | INTRAMUSCULAR | Status: DC
  Administered 2020-10-29 – 2020-10-30 (×5): 9 [IU] via SUBCUTANEOUS
  Filled 2020-10-28 (×5): qty 1

## 2020-10-28 MED ORDER — FERROUS SULFATE 325 (65 FE) MG PO TABS: 325.0000 mg | ORAL_TABLET | ORAL | 1 refills | Status: DC

## 2020-10-28 MED ORDER — INSULIN GLARGINE 100 UNIT/ML ~~LOC~~ SOLN: 40.0000 [IU] | Freq: Every day | SUBCUTANEOUS | 2 refills | Status: DC

## 2020-10-28 MED ORDER — OLANZAPINE 10 MG PO TABS
10.0000 mg | ORAL_TABLET | Freq: Every day | ORAL | Status: DC
  Administered 2020-10-28 – 2020-10-29 (×2): 10 mg via ORAL
  Filled 2020-10-28 (×2): qty 1

## 2020-10-28 MED ORDER — PANTOPRAZOLE SODIUM 40 MG PO TBEC
40.0000 mg | DELAYED_RELEASE_TABLET | Freq: Every day | ORAL | Status: DC
  Administered 2020-10-29 – 2020-10-30 (×2): 40 mg via ORAL
  Filled 2020-10-28 (×2): qty 1

## 2020-10-28 MED ORDER — LISINOPRIL 5 MG PO TABS
15.0000 mg | ORAL_TABLET | Freq: Every day | ORAL | Status: DC
  Administered 2020-10-29 – 2020-10-30 (×2): 15 mg via ORAL
  Filled 2020-10-28 (×2): qty 1

## 2020-10-28 MED ORDER — BENZTROPINE MESYLATE 1 MG PO TABS
1.0000 mg | ORAL_TABLET | Freq: Two times a day (BID) | ORAL | Status: DC
  Administered 2020-10-28 – 2020-10-30 (×4): 1 mg via ORAL
  Filled 2020-10-28 (×4): qty 1

## 2020-10-28 MED ORDER — METFORMIN HCL ER (OSM) 1000 MG PO TB24: 1000.0000 mg | ORAL_TABLET | Freq: Every day | ORAL | 1 refills | Status: DC

## 2020-10-28 MED ORDER — GABAPENTIN 300 MG PO CAPS: 600.0000 mg | ORAL_CAPSULE | Freq: Three times a day (TID) | ORAL | 1 refills | Status: DC

## 2020-10-28 MED ORDER — BENZTROPINE MESYLATE 1 MG PO TABS: 1.0000 mg | ORAL_TABLET | Freq: Two times a day (BID) | ORAL | 1 refills | Status: DC

## 2020-10-28 MED ORDER — ATORVASTATIN CALCIUM 20 MG PO TABS
40.0000 mg | ORAL_TABLET | Freq: Every day | ORAL | Status: DC
  Administered 2020-10-28 – 2020-10-29 (×2): 40 mg via ORAL
  Filled 2020-10-28 (×2): qty 2

## 2020-10-28 MED ORDER — AMLODIPINE BESYLATE 10 MG PO TABS: 10.0000 mg | ORAL_TABLET | Freq: Every day | ORAL | 1 refills | Status: DC

## 2020-10-28 MED ORDER — LISINOPRIL 10 MG PO TABS: 15.0000 mg | ORAL_TABLET | Freq: Every day | ORAL | 1 refills | Status: DC

## 2020-10-28 MED ORDER — DESMOPRESSIN ACETATE 0.2 MG PO TABS: 0.2000 mg | ORAL_TABLET | Freq: Every day | ORAL | 1 refills | Status: DC

## 2020-10-28 MED ORDER — PALIPERIDONE ER 3 MG PO TB24
9.0000 mg | ORAL_TABLET | Freq: Every day | ORAL | Status: DC
  Administered 2020-10-29 – 2020-10-30 (×2): 9 mg via ORAL
  Filled 2020-10-28 (×3): qty 3

## 2020-10-28 MED ORDER — ATORVASTATIN CALCIUM 20 MG PO TABS: 40.0000 mg | ORAL_TABLET | Freq: Every day | ORAL | 1 refills | Status: DC

## 2020-10-28 MED ORDER — METFORMIN HCL ER 500 MG PO TB24
1000.0000 mg | ORAL_TABLET | Freq: Every day | ORAL | Status: DC
  Administered 2020-10-29 – 2020-10-30 (×2): 1000 mg via ORAL
  Filled 2020-10-28 (×3): qty 2

## 2020-10-28 MED ORDER — MELATONIN 5 MG PO TABS
10.0000 mg | ORAL_TABLET | Freq: Every day | ORAL | Status: DC
  Administered 2020-10-28 – 2020-10-29 (×2): 10 mg via ORAL
  Filled 2020-10-28 (×2): qty 2

## 2020-10-28 MED ORDER — GABAPENTIN 300 MG PO CAPS
600.0000 mg | ORAL_CAPSULE | Freq: Three times a day (TID) | ORAL | Status: DC
  Administered 2020-10-28 – 2020-10-30 (×5): 600 mg via ORAL
  Filled 2020-10-28 (×5): qty 2

## 2020-10-28 MED ORDER — PATIROMER SORBITEX CALCIUM 8.4 G PO PACK
16.8000 g | PACK | Freq: Every day | ORAL | Status: DC
  Administered 2020-10-28 – 2020-10-30 (×3): 16.8 g via ORAL
  Filled 2020-10-28 (×3): qty 2

## 2020-10-28 MED ORDER — DESMOPRESSIN ACETATE 0.2 MG PO TABS
0.2000 mg | ORAL_TABLET | Freq: Every day | ORAL | Status: DC
  Administered 2020-10-28 – 2020-10-29 (×2): 0.2 mg via ORAL
  Filled 2020-10-28 (×4): qty 1

## 2020-10-28 NOTE — Progress Notes (Signed)
Recreation Therapy Notes  INPATIENT RECREATION TR PLAN  Patient Details Name: Dylan Jordan MRN: [id number] DOB: Nov 01, 1955 Today's Date: 10/28/2020  Rec Therapy Plan Is patient appropriate for Therapeutic Recreation?: Yes Treatment times per week: at least 3 Estimated Length of Stay: 5-7 days TR Treatment/Interventions: Group participation (Comment)  Discharge Criteria Pt will be discharged from therapy if:: Discharged Treatment plan/goals/alternatives discussed and agreed upon by:: Patient/family  Discharge Summary Short term goals set: Patient will engage in interactions with peers and staff in pro-social manner at least 2x within 5 recreation therapy group sessions Short term goals met: Complete Progress toward goals comments: Groups attended Which groups?: Social skills, Goal setting, Other (Comment) (Time Management, Problem Solving) Reason goals not met: N/A Therapeutic equipment acquired: Discharge bag Reason patient discharged from therapy: Discharge from hospital Pt/family agrees with progress & goals achieved: Yes Date patient discharged from therapy: 10/28/20   Bianca Raneri 10/28/2020, 3:31 PM

## 2020-10-28 NOTE — BHH Counselor (Addendum)
ADDENDUM  [name] was able to reach Alex at [facility] who instructed that the patient interview would be conducted on site and did not request a phone prescreening. Patient will be transported by [employer] with spare clothes, toiletries, snacks, and shoes provided by the hospital.   Signed:  Corky Crafts, MSW, [place], LCASA 10/28/2020 10:34 AM    [name] was unable to reach [facility] to check on bed availability at this time. [name] left voicemail with callback request and contact information. [name] will make additional attempts.  Signed:  Corky Crafts, MSW, [place], LCASA 10/28/2020 9:50 AM

## 2020-10-28 NOTE — Progress Notes (Signed)
Recreation Therapy Notes   Date: 10/28/2020  Time: 10:00 am   Location: Courtyard   Behavioral response: Appropriate  Intervention Topic:  Social Skills   Discussion/Intervention:  Group content on today was focused on social skills. The group defined social skills and identified ways they use social skills. Patients expressed what obstacles they face when trying to be social. Participants described the importance of social skills. The group listed ways to improve social skills and reasons to improve social skills. Individuals had an opportunity to learn new and improve social skills as well as identify their weaknesses. Clinical Observations/Feedback: Patient came to group and was focused on what peers and staff had to say about social skills. Individual was social with staff and peers while participating in the intervention. Ijeoma Loor LRT/CTRS         Antron Seth 10/28/2020 11:49 AM

## 2020-10-28 NOTE — Progress Notes (Signed)
Patient alert and oriented x 4, affect is blunted, his thoughts are disorganized, speech is tangential and slurred, patient's mood is agitated and disorganized, he appears religiously preoccupied. Patient is not receptive to staff, he appears irritable, angry and argumentative using profanities towards staff. Patient is  intrusive, he is at the nurses station, ruminating on several subjects, his speech is tangential. Patient currently denies SI/HI/AVH, he does not follow staff advice on snack recommendations concerning his diabetes  diagnosis. 15 minutes safety checks maintained will continue to monitor

## 2020-10-28 NOTE — ED Notes (Signed)
Pt ambulatory in hallway with steady gait, without any distress noted.

## 2020-10-28 NOTE — Progress Notes (Signed)
  [facility] Adult Case Management Discharge Plan :  Will you be returning to the same living situation after discharge:  No. At discharge, do you have transportation home?: Yes,  [name] to assist with transportation.  Do you have the ability to pay for your medications: Yes,  Medicaid  Release of information consent forms completed and in the chart;  Patient's signature needed at discharge.  Patient to Follow up at:  Follow-up Information     [facility] Call.   Why: Please call to once dischared to inform ACTT team of recent hospitalization. Contact information: [address] [place] [address] [phone]         [facility] (Out[facility] Go to.   Why: Please present for walkin hours as needed, As needed Contact information: [address]  [place], [place] [address]  Phone: [phone]                Next level of care provider has access to [facility] Link:no  Safety Planning and Suicide Prevention discussed: Yes,  SPE copmleted with patient, declined collateral contact.   Have you used any form of tobacco in the last 30 days? (Cigarettes, Smokeless Tobacco, Cigars, and/or Pipes): Yes  Has patient been referred to the Quitline?: Patient refused referral  Patient has been referred for addiction treatment: Yes  Corky Crafts, LCSWA 10/28/2020, 11:24 AM

## 2020-10-28 NOTE — Discharge Summary (Signed)
Physician Discharge Summary Note  Patient:  Dylan Jordan is an 65 y.o., male MRN:  [id number] DOB:  10/03/1955 Patient phone:  [phone] (home)  Patient address:   [address] [place] [address],  Total Time spent with patient: 35 minutes- 25 minutes face-to-face contact with patient, 10 minutes documentation, coordination of care, scripts   Date of Admission:  10/20/2020 Date of Discharge: 10/28/2020  Reason for Admission:  65 year old male with schizoaffective disorder bipolar type presenting for disorganized behavior, elevated mood, auditory and visual hallucinations, suicidal ideations, and homicidal ideations.   Principal Problem: Schizoaffective disorder, bipolar type [facility]) Discharge Diagnoses: Principal Problem:   Schizoaffective disorder, bipolar type (HCC) Active Problems:   HTN (hypertension)   Mild persistent asthma   Slow transit constipation   Uncontrolled type 2 diabetes mellitus with hyperglycemia (HCC)   Past Psychiatric History:  Multiple past hospitalizations.  He has failed monotherapy with Abilify, Invega, Risperdal, Geodon, and Haldol.  He is [facility] by [facility].  Most recently was on Depakote, Zyprexa and Cogentin.  He has a history of noncompliance.  He also has failed ECT in the past with 12 sessions showing no improvement on psychosis.  No history of suicide attempts.  Denies any recent substance abuse and UDS and alcohol levels negative on admission.  Past Medical History:  Past Medical History:  Diagnosis Date   Anemia    Asthma    Diabetes mellitus without complication (HCC)    Gout    Hyperlipidemia    Hyperosmolar hyperglycemic coma due to diabetes mellitus without ketoacidosis (HCC)    Hypertension    Schizophrenia (HCC) 07/06/2018    Past Surgical History:  Procedure Laterality Date   REPLACEMENT TOTAL KNEE Left    TOE SURGERY     Family History:  Family History  Adopted: Yes  Problem Relation Age of Onset   Allergic  rhinitis Neg Hx    Angioedema Neg Hx    Asthma Neg Hx    Atopy Neg Hx    Eczema Neg Hx    Immunodeficiency Neg Hx    Urticaria Neg Hx    Family Psychiatric  History: Patient denies,  per chart review no known family psychiatric history Social History:  Social History   Substance and Sexual Activity  Alcohol Use No     Social History   Substance and Sexual Activity  Drug Use No    Social History   Socioeconomic History   Marital status: Single    Spouse name: Not on file   Number of children: Not on file   Years of education: Not on file   Highest education level: Not on file  Occupational History   Not on file  Tobacco Use   Smoking status: Some Days    Packs/day: 0.30    Years: 45.00    Pack years: 13.50    Types: Cigarettes    Start date: 05/16/1971   Smokeless tobacco: Never   Tobacco comments:    Decreased Intake to 10 cigarettes per week or less.  Vaping Use   Vaping Use: Never used  Substance and Sexual Activity   Alcohol use: No   Drug use: No   Sexual activity: Not on file  Other Topics Concern   Not on file  Social History Narrative   Lives by himself at home. Closest family member is niece.       ETOH none   Drugs non e   Current smoker, none for three days  Social Determinants of Health   Financial Resource Strain: Not on file  Food Insecurity: Not on file  Transportation Needs: Not on file  Physical Activity: Not on file  Stress: Not on file  Social Connections: Not on file    Hospital Course:  65 year old male with schizoaffective disorder bipolar type presenting for disorganized behavior, elevated mood, auditory and visual hallucinations, suicidal ideations, and homicidal ideations. Patient was started on Invega and titrated to 9 mg QHS. He had some improvement, but not adequate improvement with this medication. He also refused to take the long-acting injectable. Effort was made to take him off Zyprexa and transition to Haldol given  poorly controlled diabetes, however, this was the only other medication patient was willing to take. On this combination mood stabilized, he began sleeping, and could better participate in his care. At baseline he can be terse and demanding, but he became much more reasonable and organized as time progressed. He denies suicidal ideations, homicidal ideations, visual hallucinations, and auditory hallucinations. He no longer appears internally preoccupied. Chronic medical issues were addressed during hospital stay and managed with medication list below. Unfortunately, he has also burned most bridges for housing and temporary shelter at a hotel leaving him with no option other than to go to a shelter at discharge. He remains on a waiting list for TCL and section-8 housing and is followed by [facility].   Physical Findings: AIMS: Facial and Oral Movements Muscles of Facial Expression: None, normal Lips and Perioral Area: None, normal Jaw: None, normal Tongue: None, normal,Extremity Movements Upper (arms, wrists, hands, fingers): None, normal Lower (legs, knees, ankles, toes): None, normal, Trunk Movements Neck, shoulders, hips: None, normal, Overall Severity Severity of abnormal movements (highest score from questions above): None, normal Incapacitation due to abnormal movements: None, normal Patient's awareness of abnormal movements (rate only patient's report): No Awareness, Dental Status Current problems with teeth and/or dentures?: Yes Does patient usually wear dentures?: No  CIWA:    COWS:     Musculoskeletal: Strength & Muscle Tone: within normal limits Gait & Station: normal Patient leans: N/A   Psychiatric Specialty Exam:  Presentation  General Appearance: Casual  Eye Contact:Good  Speech:Normal Rate  Speech Volume:Normal  Handedness:Right   Mood and Affect  Mood:Euthymic  Affect:Congruent   Thought Process  Thought Processes:Goal Directed;  Coherent  Descriptions of Associations:Intact  Orientation:Full (Time, Place and Person)  Thought Content:Logical  History of Schizophrenia/Schizoaffective disorder:Yes  Duration of Psychotic Symptoms:Greater than six months  Hallucinations:Hallucinations: None  Ideas of Reference:None  Suicidal Thoughts:Suicidal Thoughts: No  Homicidal Thoughts:Homicidal Thoughts: No   Sensorium  Memory:Immediate Fair; Recent Fair; Remote Fair  Judgment:Intact  Insight:Present   Executive Functions  Concentration:Good  Attention Span:Good  Recall:Good  Fund of Knowledge:Good  Language:Good   Psychomotor Activity  Psychomotor Activity:Psychomotor Activity: Normal   Assets  Assets:Communication Skills; Desire for Improvement; Financial Resources/Insurance; Leisure Time; Social Support   Sleep  Sleep:Sleep: Fair    Physical Exam: Physical Exam Vitals and nursing note reviewed.  Constitutional:      Appearance: Normal appearance.  HENT:     Head: Normocephalic and atraumatic.     Right Ear: External ear normal.     Left Ear: External ear normal.     Nose: Nose normal.     Mouth/Throat:     Mouth: Mucous membranes are moist.     Pharynx: Oropharynx is clear.  Eyes:     Extraocular Movements: Extraocular movements intact.     Conjunctiva/sclera:  Conjunctivae normal.     Pupils: Pupils are equal, round, and reactive to light.  Cardiovascular:     Rate and Rhythm: Normal rate.     Pulses: Normal pulses.  Pulmonary:     Effort: Pulmonary effort is normal.     Breath sounds: Normal breath sounds.  Abdominal:     General: Abdomen is flat.     Palpations: Abdomen is soft.  Musculoskeletal:        General: No swelling. Normal range of motion.     Cervical back: Normal range of motion and neck supple.  Skin:    General: Skin is warm and dry.  Neurological:     General: No focal deficit present.     Mental Status: He is alert and oriented to person, place, and  time.  Psychiatric:        Mood and Affect: Mood normal.        Behavior: Behavior normal.        Thought Content: Thought content normal.        Judgment: Judgment normal.   Review of Systems  Constitutional: Negative.   HENT: Negative.    Eyes: Negative.   Respiratory: Negative.    Cardiovascular: Negative.   Gastrointestinal: Negative.   Genitourinary: Negative.   Musculoskeletal:  Positive for joint pain. Negative for myalgias.  Skin: Negative.   Neurological: Negative.   Endo/Heme/Allergies:  Positive for environmental allergies. Does not bruise/bleed easily.  Psychiatric/Behavioral:  Negative for depression, hallucinations, memory loss, substance abuse and suicidal ideas. The patient is not nervous/anxious and does not have insomnia.   Blood pressure 140/85, pulse 84, temperature 98 F (36.7 C), temperature source Oral, resp. rate 17, height 6\' 4"  (1.93 m), weight 103.2 kg, SpO2 99 %. Body mass index is 27.69 kg/m.   Social History   Tobacco Use  Smoking Status Some Days   Packs/day: 0.30   Years: 45.00   Pack years: 13.50   Types: Cigarettes   Start date: 05/16/1971  Smokeless Tobacco Never  Tobacco Comments   Decreased Intake to 10 cigarettes per week or less.   Tobacco Cessation:  A prescription for an FDA-approved tobacco cessation medication was offered at discharge and the patient refused   Blood Alcohol level:  Lab Results  Component Value Date   [facility] <10 10/17/2020   ETH <10 10/16/2020    Metabolic Disorder Labs:  Lab Results  Component Value Date   HGBA1C 13.6 (H) 10/17/2020   MPG 344 10/17/2020   MPG 197.25 03/15/2020   No results found for: PROLACTIN Lab Results  Component Value Date   CHOL 206 (H) 10/21/2020   TRIG 367 (H) 10/21/2020   HDL 35 (L) 10/21/2020   CHOLHDL 5.9 10/21/2020   VLDL 73 (H) 10/21/2020   LDLCALC 98 10/21/2020   LDLCALC 77 03/18/2018    See Psychiatric Specialty Exam and Suicide Risk Assessment completed by  Attending Physician prior to discharge.  Discharge destination:  Other:  [facility]  Is patient on multiple antipsychotic therapies at discharge:  Yes,   Do you recommend tapering to monotherapy for antipsychotics?  No   Has Patient had three or more failed trials of antipsychotic monotherapy by history:  Yes,   Antipsychotic medications that previously failed include:   1.  Zyprexa., 2.  [facility] and 3.  Haldol. 4. Risperdal, 5. Geodon, 6. Clozaril  Recommended Plan for Multiple Antipsychotic Therapies: NA  Discharge Instructions     Diet Carb Modified   Complete  by: As directed    Increase activity slowly   Complete by: As directed       Allergies as of 10/28/2020       Reactions   Chantix [varenicline Tartrate] Other (See Comments)   Nightmares   Ativan [lorazepam] Other (See Comments)   Agitation (and "5 others like it") Will need to call [facility] [place] (phone) [phone] and [phone] to fax a release-   Other Other (See Comments)   Chinese Rubber Tree Stem Bark- sneezing   Pineapple Other (See Comments)   Pt states "all my allergies make me feel eerie." " It does not make me feel absolute."    Penicillins Diarrhea        Medication List     STOP taking these medications    divalproex 500 MG DR tablet Commonly known as: DEPAKOTE   ferrous sulfate 325 (65 FE) MG EC tablet Replaced by: ferrous sulfate 325 (65 FE) MG tablet   gabapentin 600 MG tablet Commonly known as: NEURONTIN Replaced by: gabapentin 300 MG capsule   insulin lispro 100 UNIT/ML injection Commonly known as: HUMALOG   Lantus SoloStar 100 UNIT/ML Solostar Pen Generic drug: insulin glargine Replaced by: insulin glargine 100 UNIT/ML injection   omeprazole 20 MG capsule Commonly known as: PRILOSEC Replaced by: pantoprazole 40 MG tablet   polyethylene glycol 17 g packet Commonly known as: MIRALAX / GLYCOLAX       TAKE these medications      Indication   albuterol 108 (90 Base) MCG/ACT inhaler Commonly known as: VENTOLIN HFA Inhale 1-2 puffs into the lungs every 4 (four) hours as needed for wheezing or shortness of breath. What changed: when to take this  Indication: Asthma   amLODipine 10 MG tablet Commonly known as: NORVASC Take 1 tablet (10 mg total) by mouth daily.  Indication: High Blood Pressure Disorder   atorvastatin 20 MG tablet Commonly known as: LIPITOR Take 2 tablets (40 mg total) by mouth at bedtime.  Indication: High Amount of Fats in the Blood   benztropine 1 MG tablet Commonly known as: COGENTIN Take 1 tablet (1 mg total) by mouth 2 (two) times daily. What changed:  medication strength how much to take  Indication: Extrapyramidal Reaction caused by Medications   desmopressin 0.2 MG tablet Commonly known as: DDAVP Take 1 tablet (0.2 mg total) by mouth at bedtime.  Indication: Bedwetting   ferrous sulfate 325 (65 FE) MG tablet Take 1 tablet (325 mg total) by mouth every other day. Replaces: ferrous sulfate 325 (65 FE) MG EC tablet  Indication: Iron Deficiency   gabapentin 300 MG capsule Commonly known as: NEURONTIN Take 2 capsules (600 mg total) by mouth 3 (three) times daily. Replaces: gabapentin 600 MG tablet  Indication: Diabetes with Nerve Disease   insulin aspart 100 UNIT/ML injection Commonly known as: novoLOG Inject 9 Units into the skin 3 (three) times daily with meals.  Indication: Type 2 Diabetes   insulin glargine 100 UNIT/ML injection Commonly known as: LANTUS Inject 0.4 mLs (40 Units total) into the skin daily. Start taking on: October 29, 2020 Replaces: Lantus SoloStar 100 UNIT/ML Solostar Pen  Indication: Type 2 Diabetes   lisinopril 10 MG tablet Commonly known as: ZESTRIL Take 1.5 tablets (15 mg total) by mouth daily. Start taking on: October 29, 2020  Indication: High Blood Pressure Disorder   Melatonin 10 MG Tabs Take 10 mg by mouth at bedtime. What changed:  medication  strength how much to take  Indication: Trouble Sleeping   metformin 1000 MG (OSM) 24 hr tablet Commonly known as: FORTAMET Take 1 tablet (1,000 mg total) by mouth daily with breakfast. Start taking on: October 29, 2020 What changed: when to take this  Indication: Type 2 Diabetes   OLANZapine 10 MG tablet Commonly known as: ZYPREXA Take 1 tablet (10 mg total) by mouth at bedtime. What changed:  medication strength how much to take when to take this  Indication: Schizophrenia   paliperidone 9 MG 24 hr tablet Commonly known as: INVEGA Take 1 tablet (9 mg total) by mouth daily. Start taking on: October 29, 2020  Indication: Schizoaffective Disorder   pantoprazole 40 MG tablet Commonly known as: PROTONIX Take 1 tablet (40 mg total) by mouth daily. Start taking on: October 29, 2020 Replaces: omeprazole 20 MG capsule  Indication: Gastroesophageal Reflux Disease        Follow-up Information     [facility] Call.   Why: Please call to once dischared to inform ACTT team of recent hospitalization. Contact information: [address] [place] [address] [phone]                 Follow-up recommendations:  Activity:  as tolerated Diet:  carb modified  Comments:  Printed 30-day scripts with 1 refill provided at discharge. Patient on dual antipsychotics due to failing monotherapy with 6 agents. He has also previously failed a 12-session index course of ECT. He refuses blood work most of the time making Depakote and Lithium unsafe to use. Unfortunately, he has also burned most bridges for housing and temporary shelter at a hotel leaving him with no option other than to go to a shelter at discharge. He remains on a waiting list for TCL and section-8 housing and is followed by [facility].    Signed: Jesse Sans, MD 10/28/2020, 9:34 AM

## 2020-10-28 NOTE — BHH Counselor (Signed)
Patient's ACTT member visited with patient in group therapy room to discuss housing, income, and medication. [name] informed ACTT that the patient is scheduled for discharge, likely to [facility]. [facility] intends on transferring patient's case to [place] team for him to be followed in [place].   Signed:  Corky Crafts, MSW, [place], LCASA 10/28/2020 9:35 AM

## 2020-10-28 NOTE — Plan of Care (Signed)
Patient during assessments this morning endorsed his mood as "fine" and affect was constricted to flat with an irritable edge. Pt. Denied si/hi/avh and endorsed ability to continue to remain safe on the unit. Pt. Endorsed continued chronic pain in bilateral knees (given PRN tylenol with good results). Pt. Endorsed no problems with sleep or eating, just expressed he continues to be annoyed about dietary restrictions put into place to control his blood sugar. Pt. Endorsed continued toleration of his medications. Pt. Orientation appreciably grossly intact. Pt. Thought process appreciably continues to present concrete, circumstantial, and tangential.    Patient has been complaint with medications and unit procedures thus far with direction, encouragement, redirection, and limit setting. Pt. Has been observed eating good thus far, observed getting up to eat breakfast. Pt. Has been able to remain safe on the unit thus far. Pt. Blood sugars are monitored per MD orders. Pt. Thus far mostly observed watching tv in the day room and or resting in his room.   Q x 15 minute observation checks in place/maintained for safety. Patient is provided with education throughout shift when appropriate and able, as well as when information regarding pending discharge comes in.  Patient is given/offered medications per orders. Patient is encouraged to attend groups, participate in unit activities and continue with plan of care until pending discharge is complete. Pt. Chart and plans of care reviewed. Pt. Given support and encouragement when appropriate and able. Will continue to process pending discharge.    Problem: Education: Goal: Knowledge of [place] [facility] Education information/materials will improve Outcome: Progressing Note: Pt. Today more accepting of information provided.    Problem: Health Behavior/Discharge Planning: Goal: Compliance with treatment plan for underlying cause of condition will improve Outcome:  Progressing Note: Pt. Today more amenable to taking his morning medications.    Problem: Safety: Goal: Periods of time without injury will increase Outcome: Progressing Note: Pt. Denied si/hi/avh and endorsed ability to continue to remain safe on the unit.    Problem: Education: Goal: Will be free of psychotic symptoms Outcome: Progressing Note: Pt. Has not been observed RTIS or disorganized in his thought process thus far, but remains concrete and tangential.

## 2020-10-28 NOTE — Plan of Care (Signed)
  Problem: Group Participation Goal: STG - Patient will engage in interactions with peers and staff in pro-social manner at least 2x within 5 recreation therapy group sessions Description: STG - Patient will engage in interactions with peers and staff in pro-social manner at least 2x within 5 recreation therapy group sessions Outcome: Completed/Met

## 2020-10-28 NOTE — ED Triage Notes (Addendum)
Pt to ER after being discharged to homeless shelter from behavioral medicine today. Pt arrived to shelter and was told that they couldn't take care of him due to his extensive medical history. Pt reports wanting to be admitted back to the behavioral medical unit and to stay through the weekend so they can find him a place to stay. Denies HI/ SI. Pt reports auditory hallucinations and paranoia. Denies any other complaints.

## 2020-10-28 NOTE — Progress Notes (Addendum)
Patient denies SI/HI/AVH, able to contract for safety at this time. Pt appears calm and cooperative, and no distress noted.   All Personal items in locker returned to pt. Upon discharge.   Pt States he will comply with discharge planning put into place and take MEDS as prescribed. Pt escorted out of the building by this writer/staff.

## 2020-10-28 NOTE — ED Notes (Signed)
See triage note, nothing to add at this time.  

## 2020-10-28 NOTE — BHH Suicide Risk Assessment (Signed)
[facility] Discharge Suicide Risk Assessment   Principal Problem: Schizoaffective disorder, bipolar type [facility]) Discharge Diagnoses: Principal Problem:   Schizoaffective disorder, bipolar type (HCC) Active Problems:   HTN (hypertension)   Mild persistent asthma   Slow transit constipation   Uncontrolled type 2 diabetes mellitus with hyperglycemia (HCC)   Total Time spent with patient: 35 minutes- 25 minutes face-to-face contact with patient, 10 minutes documentation, coordination of care, scripts   Musculoskeletal: Strength & Muscle Tone: within normal limits Gait & Station: normal Patient leans: N/A  Psychiatric Specialty Exam  Presentation  General Appearance: Casual  Eye Contact:Good  Speech:Normal Rate  Speech Volume:Normal  Handedness:Right   Mood and Affect  Mood:Euthymic  Duration of Depression Symptoms: No data recorded Affect:Congruent   Thought Process  Thought Processes:Goal Directed; Coherent  Descriptions of Associations:Intact  Orientation:Full (Time, Place and Person)  Thought Content:Logical  History of Schizophrenia/Schizoaffective disorder:Yes  Duration of Psychotic Symptoms:Greater than six months  Hallucinations:Hallucinations: None  Ideas of Reference:None  Suicidal Thoughts:Suicidal Thoughts: No  Homicidal Thoughts:Homicidal Thoughts: No   Sensorium  Memory:Immediate Fair; Recent Fair; Remote Fair  Judgment:Intact  Insight:Present   Executive Functions  Concentration:Good  Attention Span:Good  Recall:Good  Fund of Knowledge:Good  Language:Good   Psychomotor Activity  Psychomotor Activity:Psychomotor Activity: Normal   Assets  Assets:Communication Skills; Desire for Improvement; Financial Resources/Insurance; Leisure Time; Social Support   Sleep  Sleep:Sleep: Fair   Physical Exam: Physical Exam ROS Blood pressure 140/85, pulse 84, temperature 98 F (36.7 C), temperature source Oral, resp. rate 17, height  6\' 4"  (1.93 m), weight 103.2 kg, SpO2 99 %. Body mass index is 27.69 kg/m.  Mental Status Per Nursing Assessment::   On Admission:  NA  Demographic Factors:  Male  Loss Factors: NA  Historical Factors: Impulsivity  Risk Reduction Factors:   Sense of responsibility to family, Religious beliefs about death, Living with another person, especially a relative, Positive social support, Positive therapeutic relationship, and Positive coping skills or problem solving skills  Continued Clinical Symptoms:  Schizophrenia:   Paranoid or undifferentiated type Previous Psychiatric Diagnoses and Treatments Medical Diagnoses and Treatments/Surgeries  Cognitive Features That Contribute To Risk:  None    Suicide Risk:  Minimal: No identifiable suicidal ideation.  Patients presenting with no risk factors but with morbid ruminations; may be classified as minimal risk based on the severity of the depressive symptoms   Follow-up Information     [facility] Call.   Why: Please call to once dischared to inform ACTT team of recent hospitalization. Contact information: [address] [place] [phone]                 Plan Of Care/Follow-up recommendations:  Activity:  as tolerated Diet:  carb modified diet  [phone], MD 10/28/2020, 9:33 AM

## 2020-10-28 NOTE — ED Notes (Signed)
Pt given [place] lunch tray at this time.

## 2020-10-28 NOTE — ED Notes (Signed)
First Nurse note   Presents back to ED states he was discharged for Beh med to shelter in [place]    States when he got there they would not accept him  So he is back    Homeless  Denies any si /hi
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
## 2020-10-28 NOTE — BH Assessment (Signed)
Comprehensive Clinical Assessment (CCA) Screening, Triage and Referral Note  10/28/2020 Dylan Jordan [id number]  Dylan Jordan 65 year old male who presents to the ER because he was denied at [facility]. Patient was discharge from [facility] today and per his report, he was denied at [facility] because of his medical conditions. He was unable to go into details about it because "that's all they told me." Patient reports he is only here because he need help with somewhere to live.  During the interview the patient was calm, cooperative and pleasant. He was able to provide appropriate answers to the questions. Throughout the interview, he denied SI/HI but reported of having history of AV/H.  Chief Complaint:  Chief Complaint  Patient presents with   Homeless   Visit Diagnosis: Schizoaffective Disorder  Patient Reported Information How did you hear about [place]? Self  What Is the Reason for Your Visit/Call Today? Was denied at [facility]  How Long Has This Been Causing You Problems? <Week  What Do You Feel Would Help You the Most Today? Housing Assistance   Have You Recently Had Any Thoughts About Hurting Yourself? No  Are You Planning to Commit Suicide/Harm Yourself At This time? No   Have you Recently Had Thoughts About Hurting Someone Dylan Jordan? No  Are You Planning to Harm Someone at This Time? No  Explanation: No data recorded  Have You Used Any Alcohol or Drugs in the Past 24 Hours? No  How Long Ago Did You Use Drugs or Alcohol? No data recorded What Did You Use and How Much? No data recorded  Do You Currently Have a Therapist/Psychiatrist? No  Name of Therapist/Psychiatrist: No data recorded  Have You Been Recently Discharged From Any Office Practice or Programs? No  Explanation of Discharge From Practice/Program: No data recorded   CCA Screening Triage Referral Assessment Type of Contact: Face-to-Face  Telemedicine Service Delivery:   Is this  Initial or Reassessment? Initial Assessment  Date Telepsych consult ordered in [facility]:  10/17/20  Time Telepsych consult ordered in [facility]:  1308  Location of Assessment: [facility] ED  Provider Location: No data recorded  Collateral Involvement: NA   Does Patient Have a Court Appointed Legal Guardian? No data recorded Name and Contact of Legal Guardian: self  If Minor and Not Living with Parent(s), Who has Custody? n/a  Is CPS involved or ever been involved? Never  Is APS involved or ever been involved? Never  Patient Determined To Be At Risk for Harm To Self or Others Based on Review of Patient Reported Information or Presenting Complaint? No  Method: No data recorded Availability of Means: No data recorded Intent: No data recorded Notification Required: No data recorded Additional Information for Danger to Others Potential: No data recorded Additional Comments for Danger to Others Potential: No data recorded Are There Guns or Other Weapons in Your Home? No data recorded Types of Guns/Weapons: No data recorded Are These Weapons Safely Secured?                            No data recorded Who Could Verify You Are Able To Have These Secured: No data recorded Do You Have any Outstanding Charges, Pending Court Dates, Parole/Probation? No data recorded Contacted To Inform of Risk of Harm To Self or Others: No data recorded  Does Patient Present under Involuntary Commitment? No  IVC Papers Initial File Date: No data recorded  [place] of Residence: [address]  Patient Currently Receiving the Following Services: ACTT (Assertive Community Treatment)   Determination of Need: Emergent (2 hours)   Options For Referral: Medication Management   Discharge Disposition:    Dylan Gilford MS, LCAS, [facility], [facility] Therapeutic Triage Specialist 10/28/2020 7:02 PM

## 2020-10-28 NOTE — Plan of Care (Signed)
Care Planning Closed Out For Pending Discharge.  Problem: Education: Goal: Knowledge of [facility] Education information/materials will improve 10/28/2020 1204 by Lenox Ponds, RN Outcome: Adequate for Discharge 10/28/2020 0948 by Lenox Ponds, RN Outcome: Progressing Note: Pt. Today more accepting of information provided.  Goal: Emotional status will improve Outcome: Adequate for Discharge Goal: Mental status will improve Outcome: Adequate for Discharge Goal: Verbalization of understanding the information provided will improve Outcome: Adequate for Discharge   Problem: Activity: Goal: Interest or engagement in activities will improve Outcome: Adequate for Discharge Goal: Sleeping patterns will improve Outcome: Adequate for Discharge   Problem: Coping: Goal: Ability to verbalize frustrations and anger appropriately will improve Outcome: Adequate for Discharge Goal: Ability to demonstrate self-control will improve Outcome: Adequate for Discharge   Problem: Health Behavior/Discharge Planning: Goal: Identification of resources available to assist in meeting health care needs will improve Outcome: Adequate for Discharge Goal: Compliance with treatment plan for underlying cause of condition will improve 10/28/2020 1204 by Lenox Ponds, RN Outcome: Adequate for Discharge 10/28/2020 0948 by Lenox Ponds, RN Outcome: Progressing Note: Pt. Today more amenable to taking his morning medications.    Problem: Physical Regulation: Goal: Ability to maintain clinical measurements within normal limits will improve Outcome: Adequate for Discharge   Problem: Safety: Goal: Periods of time without injury will increase 10/28/2020 1204 by Lenox Ponds, RN Outcome: Adequate for Discharge 10/28/2020 0948 by Lenox Ponds, RN Outcome: Progressing Note: Pt. Denied si/hi/avh and endorsed ability to continue to remain safe on the unit.    Problem: Activity: Goal: Will  verbalize the importance of balancing activity with adequate rest periods Outcome: Adequate for Discharge   Problem: Education: Goal: Will be free of psychotic symptoms 10/28/2020 1204 by Lenox Ponds, RN Outcome: Adequate for Discharge 10/28/2020 0948 by Lenox Ponds, RN Outcome: Progressing Note: Pt. Has not been observed RTIS or disorganized in his thought process thus far, but remains concrete and tangential.  Goal: Knowledge of the prescribed therapeutic regimen will improve Outcome: Adequate for Discharge   Problem: Coping: Goal: Coping ability will improve Outcome: Adequate for Discharge Goal: Will verbalize feelings Outcome: Adequate for Discharge   Problem: Health Behavior/Discharge Planning: Goal: Compliance with prescribed medication regimen will improve Outcome: Adequate for Discharge   Problem: Nutritional: Goal: Ability to achieve adequate nutritional intake will improve Outcome: Adequate for Discharge   Problem: Role Relationship: Goal: Ability to communicate needs accurately will improve Outcome: Adequate for Discharge Goal: Ability to interact with others will improve Outcome: Adequate for Discharge   Problem: Safety: Goal: Ability to redirect hostility and anger into socially appropriate behaviors will improve Outcome: Adequate for Discharge Goal: Ability to remain free from injury will improve Outcome: Adequate for Discharge   Problem: Self-Care: Goal: Ability to participate in self-care as condition permits will improve Outcome: Adequate for Discharge   Problem: Self-Concept: Goal: Will verbalize positive feelings about self Outcome: Adequate for Discharge

## 2020-10-29 ENCOUNTER — Other Ambulatory Visit: Payer: Self-pay

## 2020-10-29 LAB — CBG MONITORING, ED
Glucose-Capillary: 167 mg/dL — ABNORMAL HIGH (ref 70–99)
Glucose-Capillary: 178 mg/dL — ABNORMAL HIGH (ref 70–99)
Glucose-Capillary: 211 mg/dL — ABNORMAL HIGH (ref 70–99)
Glucose-Capillary: 280 mg/dL — ABNORMAL HIGH (ref 70–99)
Glucose-Capillary: 308 mg/dL — ABNORMAL HIGH (ref 70–99)
Glucose-Capillary: 364 mg/dL — ABNORMAL HIGH (ref 70–99)

## 2020-10-29 LAB — POTASSIUM
Potassium: 5.5 mmol/L — ABNORMAL HIGH (ref 3.5–5.1)
Potassium: 5.3 mmol/L — ABNORMAL HIGH (ref 3.5–5.1)

## 2020-10-29 MED ORDER — FERROUS SULFATE 325 (65 FE) MG PO TABS: 325.0000 mg | ORAL_TABLET | ORAL | 1 refills | Status: AC

## 2020-10-29 MED ORDER — INSULIN GLARGINE 100 UNIT/ML ~~LOC~~ SOLN: 40.0000 [IU] | Freq: Every day | SUBCUTANEOUS | 2 refills | Status: AC

## 2020-10-29 MED ORDER — INSULIN ASPART 100 UNIT/ML IJ SOLN: 9.0000 [IU] | Freq: Three times a day (TID) | INTRAMUSCULAR | 1 refills | Status: AC

## 2020-10-29 MED ORDER — BENZTROPINE MESYLATE 1 MG PO TABS: 1.0000 mg | ORAL_TABLET | Freq: Two times a day (BID) | ORAL | 1 refills | Status: AC

## 2020-10-29 MED ORDER — ALBUTEROL SULFATE HFA 108 (90 BASE) MCG/ACT IN AERS: 1.0000 | INHALATION_SPRAY | RESPIRATORY_TRACT | 1 refills | Status: AC | PRN

## 2020-10-29 MED ORDER — MELATONIN 10 MG PO TABS: 10.0000 mg | ORAL_TABLET | Freq: Every day | ORAL | 1 refills | Status: AC

## 2020-10-29 MED ORDER — GABAPENTIN 300 MG PO CAPS: 600.0000 mg | ORAL_CAPSULE | Freq: Three times a day (TID) | ORAL | 0 refills | Status: AC

## 2020-10-29 MED ORDER — METFORMIN HCL ER (OSM) 1000 MG PO TB24: 1000.0000 mg | ORAL_TABLET | Freq: Every day | ORAL | 1 refills | Status: DC

## 2020-10-29 MED ORDER — NICOTINE 21 MG/24HR TD PT24
21.0000 mg | MEDICATED_PATCH | Freq: Every day | TRANSDERMAL | 0 refills | Status: DC
Start: 2020-10-29 — End: 2020-11-28

## 2020-10-29 MED ORDER — OLANZAPINE 10 MG PO TABS: 10.0000 mg | ORAL_TABLET | Freq: Two times a day (BID) | ORAL | 1 refills | Status: AC

## 2020-10-29 MED ORDER — NICOTINE 21 MG/24HR TD PT24
21.0000 mg | MEDICATED_PATCH | Freq: Once | TRANSDERMAL | Status: AC
  Administered 2020-10-29: 21 mg via TRANSDERMAL
  Filled 2020-10-29: qty 1

## 2020-10-29 MED ORDER — PANTOPRAZOLE SODIUM 40 MG PO TBEC: 40.0000 mg | DELAYED_RELEASE_TABLET | Freq: Every day | ORAL | 1 refills | Status: AC

## 2020-10-29 MED ORDER — AMLODIPINE BESYLATE 10 MG PO TABS: 10.0000 mg | ORAL_TABLET | Freq: Every day | ORAL | 1 refills | Status: AC

## 2020-10-29 MED ORDER — INSULIN ASPART 100 UNIT/ML IJ SOLN
9.0000 [IU] | Freq: Once | INTRAMUSCULAR | Status: AC
  Administered 2020-10-29: 9 [IU] via SUBCUTANEOUS
  Filled 2020-10-29: qty 1

## 2020-10-29 MED ORDER — LISINOPRIL 10 MG PO TABS: 10.0000 mg | ORAL_TABLET | Freq: Every day | ORAL | 1 refills | Status: AC

## 2020-10-29 MED ORDER — ACETAMINOPHEN 325 MG PO TABS
ORAL_TABLET | ORAL | Status: AC
  Filled 2020-10-29: qty 2

## 2020-10-29 MED ORDER — ACETAMINOPHEN 325 MG PO TABS
650.0000 mg | ORAL_TABLET | Freq: Once | ORAL | Status: AC
  Administered 2020-10-29: 650 mg via ORAL

## 2020-10-29 MED ORDER — DESMOPRESSIN ACETATE 0.2 MG PO TABS: 0.2000 mg | ORAL_TABLET | Freq: Every day | ORAL | 1 refills | Status: AC

## 2020-10-29 MED ORDER — ATORVASTATIN CALCIUM 20 MG PO TABS: 20.0000 mg | ORAL_TABLET | Freq: Every day | ORAL | 1 refills | Status: AC

## 2020-10-29 MED ORDER — PALIPERIDONE ER 9 MG PO TB24: 9.0000 mg | ORAL_TABLET | Freq: Every day | ORAL | 1 refills | Status: AC

## 2020-10-29 NOTE — ED Provider Notes (Signed)
Emergency Medicine Observation Re-evaluation Note  Khaleel Beckom is a 65 y.o. male, seen on rounds today.  Pt initially presented to the ED for complaints of Homeless Currently, the patient is resting comfortably.  Physical Exam  BP 136/90   Pulse 96   Temp 98.4 F (36.9 C) (Oral)   Resp 18   Ht 6\' 4"  (1.93 m)   Wt 103.2 kg   SpO2 100%   BMI 27.69 kg/m  Physical Exam Gen: No acute distress  Resp: Normal rise and fall of chest Neuro: Moving all four extremities Psych: Resting currently, calm and cooperative when awake    ED Course / MDM  EKG:   I have reviewed the labs performed to date as well as medications administered while in observation.  Recent changes in the last 24 hours include no acute events overnight.  Plan  Current plan is for social work evaluation for disposition.  Patient was just seen and discharged by psychiatry and sent to a homeless shelter.  Homeless shelter turned him away stating that he was too medically complex. Patient is not under full IVC at this time.   Leeba Barbe, , DO 10/29/20 [phone]

## 2020-10-29 NOTE — ED Notes (Signed)
This RN to bedside, pt requesting a room, pt requesting to go outside, this RN explained that this RN did not have a room to put him in, and that patient could not go outside right now. Pt noted to have changed himself into burgundy scrubs that came with patient. Pt continues to speak to himself at this time.
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
## 2020-10-29 NOTE — ED Notes (Signed)
Medication administered per MD order. Pt requesting nicotine patch at this time.

## 2020-10-29 NOTE — ED Notes (Signed)
Chaplain paged at request of the patient.

## 2020-10-29 NOTE — TOC Transition Note (Signed)
Transition of Care [facility]) - CM/SW Discharge Note   Patient Details  Name: Dylan Jordan MRN: [id number] Date of Birth: [birth year]  [facility] CM/SW Contact:  Marina Goodell Phone Number: [phone] 10/29/2020, 4:45 PM   Clinical Narrative:     Patient will d/c to [facility] [phone].  [facility] will pick up patient.  EDP/ED Staff updated.         Patient Goals and CMS Choice        Discharge Placement                       Discharge Plan and Services                                     Social Determinants of Health (SDOH) Interventions     Readmission Risk Interventions No flowsheet data found.

## 2020-10-29 NOTE — ED Notes (Signed)
Pt visualized in NAD, can be heard sitting in bed speaking to himself and laughing to himself at this time.

## 2020-10-29 NOTE — ED Notes (Signed)
No change in patient condition, waiting on consulting provider at this time.

## 2020-10-29 NOTE — ED Notes (Signed)
Pt in hallway bed. No s/s of distress. Pt states bilateral knee pain, but is walking with no deficits.

## 2020-10-29 NOTE — ED Notes (Signed)
Pt requesting breakfast tray at this time. Explained waiting for breakfast tray to be delivered. Pt states understanding at this time. Visualized straightening up his bed at this time.

## 2020-10-29 NOTE — ED Notes (Signed)
Pt given meal tray per his request, pt states "I'm hungry can't you see how little I'm getting?"

## 2020-10-29 NOTE — ED Notes (Signed)
Pt sitting up on side of bed eating lunch at this time.

## 2020-10-29 NOTE — ED Provider Notes (Signed)
-----------------------------------------   9:09 AM on 10/29/2020 -----------------------------------------  Repeat potassium is still slightly elevated, however the patient has no peaked T waves or other EKG abnormalities.  His creatinine is at baseline.  Do not suspect any clinically significant hyperglycemia.  The patient will be receiving insulin for his diabetes which should bring the potassium down.  There is no indication for further emergent treatment for hyperkalemia.  ED ECG REPORT I, Dionne Bucy, the attending physician, personally viewed and interpreted this ECG.  Date: 10/29/2020 EKG Time: 0836  Rate: 76 Rhythm: normal sinus rhythm QRS Axis: normal Intervals: normal ST/T Wave abnormalities: normal Narrative Interpretation: no evidence of acute ischemia    Dionne Bucy, MD 10/29/20 [phone]

## 2020-10-29 NOTE — ED Notes (Signed)
Pt sitting up on side of bed eating dinner. Pt requesting another diet coke, this RN explained patient has had 8 diet cokes today, unable to give another one at this time. Medication administered as ordered.

## 2020-10-29 NOTE — TOC Initial Note (Signed)
Transition of Care [facility]) - Initial/Assessment Note    Patient Details  Name: Dylan Jordan MRN: [id number] Date of Birth: Apr 04, 1956  Transition of Care [facility]) CM/SW Contact:    Marina Goodell Phone Number: [phone] 10/29/2020, 10:12 AM  Clinical Narrative:                  Patient presents to [facility] after discharge to [facility] where the patient stated he would not be able to do the physical work requirements.  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  Patient stated he would be abel to do the work and agreed to return to [employer].  [name] spoke with Dylan Jordan at [facility] and he stated the patient would need to agree to all of their requirements to stay.  The requirements are as follow : 65 y/o, valid I.D., not on sex offender registry, 40 hrs work requirement, 4 church services per week, first 7 days cannot leave property, no outside job for the first 7 months, no outside vehicle for 12 months.   [name] spoke with patient who stated he was willing to follow the requirements but did not have his ID. Patient stated it was lost at [facility] ED.  [name] stated I would contact [facility] ED and ask about patient's ID, [name] asked patient if I could call his sister Dylan Jordan (Sister)  [phone], patient agreed.  [name] updated RN.     Patient Goals and CMS Choice        Expected Discharge Plan and Services                                                Prior Living Arrangements/Services                       Activities of Daily Living      Permission Sought/Granted                  Emotional Assessment              Admission diagnosis:  Homeless Patient Active Problem List   Diagnosis Date Noted   Homelessness 09/05/2020    Stage 3a chronic kidney disease (HCC) 04/27/2020   Slow transit constipation 04/27/2020   Noncompliance with diabetes treatment 04/20/2020   Uncontrolled type 2 diabetes mellitus with hyperglycemia (HCC) 04/20/2020   Hyperkalemia, diminished renal excretion 03/29/2020   Hyperkalemia    Schizoaffective disorder, bipolar type (HCC) 02/21/2019   Hallucinations    Anxiety 09/18/2018   Swelling of both lower extremities 09/13/2018   Type 2 diabetes mellitus without complication, with long-term current use of insulin (HCC)    Anemia    Hyperlipidemia 12/18/2017   Mixed stress and urge urinary incontinence 11/25/2017   Intermittent chest pain 11/25/2017   Allergic rhinitis with a nonallergic component 07/30/2017   Food allergy 07/30/2017   Chronic gout involving toe of left foot without tophus 05/18/2017   Hemorrhoids 05/04/2017   Chronic low back pain 01/03/2017   Chronic cough 06/30/2016   Drug-induced mood disorder (HCC) 06/14/2016   Type 2 diabetes mellitus with diabetic  neuropathy (HCC) 05/26/2016   Iron deficiency anemia 05/26/2016   Chronic kidney disease 05/26/2016   HTN (hypertension) 05/26/2016   Tobacco abuse 05/26/2016   History of substance abuse (HCC) 05/26/2016   History of alcohol abuse 05/26/2016   Mild persistent asthma 05/26/2016   PCP:  Pcp, No Pharmacy:   [facility] [place], [place] - [address] [place] [address] Phone: [phone] Fax: [phone]  [facility] [place], [place] - [address] [place] [address] Phone: [phone] Fax: [phone]  [name] [address] [place] [address] Phone: [phone] Fax: [phone]     Social Determinants of Health (SDOH) Interventions    Readmission Risk Interventions No flowsheet data found.

## 2020-10-29 NOTE — ED Notes (Signed)
Chaplain at bedside Pt asked for RN to call niece at [phone] but there was no answer.

## 2020-10-29 NOTE — ED Notes (Signed)
EDP made aware of repeat potassium at this time. CBG checked by this RN. Pt requesting more graham crackers. This RN attempted to explain due to diabetes pt should wait for meal tray, pt states "211 after 2 meal trays is good". Charge RN aware of patient requesting a room. Continuing to await SW at this time.

## 2020-10-29 NOTE — TOC Progression Note (Signed)
Transition of Care [facility]) - Progression Note    Patient Details  Name: Dylan Jordan MRN: [id number] Date of Birth: 1955-10-12  Transition of Care [facility]) CM/SW Contact  Marina Goodell Phone Number: [phone] 10/29/2020, 5:54 PM  Clinical Narrative:     [name] spoke with [facility] Director requesting assistance for patient medications.  TOC Director will contact pharmacy and then update weekend ED [name].  This [name] updated EDP/ED Staff.  This [name] called and left voicemail for Renella Cunas (Sister) [phone] [facility]).       Expected Discharge Plan and Services                                                 Social Determinants of Health (SDOH) Interventions    Readmission Risk Interventions No flowsheet data found.

## 2020-10-29 NOTE — ED Notes (Signed)
Pt given 2nd meal tray at this time.

## 2020-10-29 NOTE — ED Notes (Signed)
This RN to bedside, introduced self to patient. Repeat blood work collected by this RN and sent to lab, repeat CBG obtained. Pt alert and oriented. Pt visualized ambulatory without difficulty to the bathroom at this time.

## 2020-10-29 NOTE — ED Notes (Signed)
EDP made aware of repeat potassium, EKG obtained by this RN per EDP.

## 2020-10-29 NOTE — ED Notes (Signed)
Additional diet cokes provided.

## 2020-10-29 NOTE — ED Notes (Signed)
Pt given phone to speak with social worker at this time.

## 2020-10-29 NOTE — ED Notes (Signed)
Pt requesting to go outside, this RN explained patient could not go outside at this time. Pt states understanding. No change in patient condition at this time. Pt continues to rest in bed, continues to be visualized speaking to himself.

## 2020-10-29 NOTE — ED Notes (Signed)
Pt visualized resting in bed with eyes closed.

## 2020-10-29 NOTE — TOC Progression Note (Addendum)
Transition of Care [facility]) - Progression Note    Patient Details  Name: Dylan Jordan MRN: [id number] Date of Birth: 17-Nov-1955  Transition [facility] Care [facility]) CM/SW Contact  Marina Goodell Phone Number: [phone] 10/29/2020, 6:30 PM  Clinical Narrative:     [name] spoke with Judeth Cornfield from patient's ACT team [phone], and she requested the EDP send all of the patient's prescriptions to [facility] in [place].  The ACT Team will deliver the prescriptions to the patient tomorrow 10/30/2020 and they will contact the weekend ED [name] when they are on their way to drop them off.  Patient will then discharge to [facility]. Weekend ED [name] will contact transportation.  [name] updated EDP with the request and he will e-prescribe the medications to [facility] this evening.   [name] spoke with Renella Cunas (Sister) [phone] [facility]), and updated her on patient's status.  Ms Blenda Bridegroom lives in [place] and is unable to assist the patient.       Expected Discharge Plan and Services                                                 Social Determinants of Health (SDOH) Interventions    Readmission Risk Interventions No flowsheet data found.

## 2020-10-29 NOTE — ED Notes (Signed)
Pt resting in bed with NAD noted at this time.   Per SW, pt unable to go to [facility] due to not physically having any of his medications. Pt with paper scripts in his possession however no physical medications. Per SW medication management closed and she is unable to call the ACT team or get in touch with patient's sister.   EDP and Charge RN updated on plan of care.

## 2020-10-29 NOTE — ED Notes (Signed)
Pt continues to be in NAD, sitting up on side of bed at this time and speaking to himself.

## 2020-10-29 NOTE — ED Notes (Signed)
Pt provided with warm blanket. HOB lowered for comfort and lights dimmed for comfort. Pt states he is going to try and sleep now.

## 2020-10-29 NOTE — ED Notes (Signed)
Pt ambulatory to the bathroom at this time.

## 2020-10-29 NOTE — TOC Progression Note (Deleted)
Transition of Care [facility]) - Progression Note    Patient Details  Name: Calib Wadhwa MRN: [id number] Date of Birth: 1956-03-29  Transition of Care [facility]) CM/SW Contact  Marina Goodell Phone Number: [phone] 10/29/2020, 4:43 PM  Clinical Narrative:     Patient will d/c to [facility] [phone].  [facility] will pick up patient.  EDP/ED Staff updated.        Expected Discharge Plan and Services                                                 Social Determinants of Health (SDOH) Interventions    Readmission Risk Interventions No flowsheet data found.

## 2020-10-30 ENCOUNTER — Other Ambulatory Visit: Payer: Self-pay

## 2020-10-30 LAB — CBG MONITORING, ED
Glucose-Capillary: 154 mg/dL — ABNORMAL HIGH (ref 70–99)
Glucose-Capillary: 110 mg/dL — ABNORMAL HIGH (ref 70–99)

## 2020-10-30 MED ORDER — HYDROCODONE-ACETAMINOPHEN 5-325 MG PO TABS: 1.0000 | ORAL_TABLET | Freq: Four times a day (QID) | ORAL | 0 refills | Status: DC | PRN

## 2020-10-30 MED ORDER — NICOTINE 21 MG/24HR TD PT24
21.0000 mg | MEDICATED_PATCH | Freq: Once | TRANSDERMAL | Status: DC
  Administered 2020-10-30: 21 mg via TRANSDERMAL
  Filled 2020-10-30: qty 1

## 2020-10-30 MED ORDER — HYDROCODONE-ACETAMINOPHEN 5-325 MG PO TABS
1.0000 | ORAL_TABLET | Freq: Four times a day (QID) | ORAL | Status: DC | PRN
  Administered 2020-10-30: 1 via ORAL
  Filled 2020-10-30: qty 1

## 2020-10-30 MED ORDER — HYDROCODONE-ACETAMINOPHEN 5-325 MG PO TABS: 1.0000 | ORAL_TABLET | Freq: Four times a day (QID) | ORAL | 0 refills | Status: AC | PRN

## 2020-10-30 NOTE — ED Notes (Signed)
Attempted to call ACT Team with the number provided by SW. Attempt unsuccessful, attempted to call the crisis line and was told that they would call me back momentarily.

## 2020-10-30 NOTE — ED Notes (Signed)
[name] at bedside.

## 2020-10-30 NOTE — ED Notes (Addendum)
Breakfast meal tray given 

## 2020-10-30 NOTE — ED Notes (Signed)
Pt ambulatory to the restroom this time with no assistance.

## 2020-10-30 NOTE — ED Notes (Signed)
  Patient given something to drink and sitting on side of the bed.

## 2020-10-30 NOTE — TOC Progression Note (Signed)
Transition of Care [facility]) - Progression Note    Patient Details  Name: Dylan Jordan MRN: [id number] Date of Birth: 27-Jan-1956  Transition of Care [facility]) CM/SW Contact  Catalia Massett [place], [place] Phone Number:[phone] 10/30/2020, 10:43 AM  Clinical Narrative:    Phone call to ACT Team crisis line to follow up on prescriptions that needed to be dropped off to patient before he presents to the [facility]. Message left with the answering service, awaiting return call. Patient agreeable to discharge to the [facility] today.   [address] LCSW Transition of Care [phone]         Expected Discharge Plan and Services                                                 Social Determinants of Health (SDOH) Interventions    Readmission Risk Interventions No flowsheet data found.

## 2020-10-30 NOTE — ED Notes (Signed)
Member of the ACT dropped off pt's prescription at this time. Unit secretary notified to call transportation

## 2020-10-30 NOTE — TOC Progression Note (Signed)
Transition of Care [facility]) - Progression Note    Patient Details  Name: Dylan Jordan MRN: [id number] Date of Birth: 11/08/1955  Transition of Care [facility]) CM/SW Contact  Francy Mcilvaine [place], [place] Phone Number:[phone] 10/30/2020, 11:06 AM  Clinical Narrative:     Phone call to the [facility], spoke with Lowella Bandy, patient is able to return there and would need to present there before 9 pm. Per RN, Almira Coaster with the [facility] ACT Team checking to see if prescriptions are ready and will bring them to patient before discharging to the [employer].  [address] LCSW Transition of Care [phone]        Expected Discharge Plan and Services                                                 Social Determinants of Health (SDOH) Interventions    Readmission Risk Interventions No flowsheet data found.

## 2020-10-30 NOTE — ED Provider Notes (Signed)
-----------------------------------------   3:41 PM on 10/30/2020 -----------------------------------------  The patient has remained stable since yesterday with no recurrent issues.  He is planned for discharge to shelter in [place].  He has been prescribed all of his medications.  Return precautions given, and he expresses understanding.   Dionne Bucy, MD 10/30/20 [phone]

## 2020-10-30 NOTE — ED Notes (Signed)
Pt given graham crackers and drink. 

## 2020-10-30 NOTE — ED Notes (Signed)
Pt requesting to see chaplin at this time. Chaplin at bedside.

## 2020-10-30 NOTE — ED Notes (Signed)
Requesting doctor at bedside. Dr. Marisa Severin notified and at bedside.

## 2020-10-30 NOTE — ED Notes (Signed)
Almira Coaster, with the [facility] ACT Team states she will see if his prescriptions are ready and will bring them if they are.

## 2020-10-30 NOTE — TOC Transition Note (Signed)
Transition of Care [facility]) - CM/SW Discharge Note   Patient Details  Name: Dylan Jordan MRN: [id number] Date of Birth: Oct 11, 1955  Transition of Care [facility]) CM/SW Contact:  Verna Czech [place], [place] Phone Number:[phone] 10/30/2020, 2:27 PM   Clinical Narrative:     ACT team member dropped off patient's prescriptions. Unit Secretary arranged for transportation to the [facility].  [address] LCSW Transition of Care [phone]         Patient Goals and CMS Choice        Discharge Placement                       Discharge Plan and Services                                     Social Determinants of Health (SDOH) Interventions     Readmission Risk Interventions No flowsheet data found.

## 2020-11-08 ENCOUNTER — Encounter (HOSPITAL_COMMUNITY): Payer: Self-pay | Admitting: Emergency Medicine

## 2020-11-08 ENCOUNTER — Ambulatory Visit (HOSPITAL_COMMUNITY)
Admission: EM | Admit: 2020-11-08 | Discharge: 2020-11-08 | Disposition: A | Discharge status: Home / Self Care | Payer: Medicaid Other | Attending: Psychiatry | Admitting: Psychiatry

## 2020-11-08 ENCOUNTER — Ambulatory Visit (HOSPITAL_COMMUNITY): Admission: EM | Admit: 2020-11-08 | Discharge: 2020-11-09 | Payer: Medicaid Other | Source: Home / Self Care

## 2020-11-08 ENCOUNTER — Other Ambulatory Visit: Payer: Self-pay

## 2020-11-08 ENCOUNTER — Emergency Department (HOSPITAL_COMMUNITY)
Admission: EM | Admit: 2020-11-08 | Discharge: 2020-11-08 | Disposition: A | Discharge status: Home / Self Care | Payer: Medicaid Other | Attending: Physician Assistant | Admitting: Physician Assistant

## 2020-11-08 DIAGNOSIS — F250 Schizoaffective disorder, bipolar type: Secondary | ICD-10-CM

## 2020-11-08 DIAGNOSIS — E1140 Type 2 diabetes mellitus with diabetic neuropathy, unspecified: Secondary | ICD-10-CM | POA: Diagnosis not present

## 2020-11-08 DIAGNOSIS — E1122 Type 2 diabetes mellitus with diabetic chronic kidney disease: Secondary | ICD-10-CM | POA: Insufficient documentation

## 2020-11-08 DIAGNOSIS — Z96652 Presence of left artificial knee joint: Secondary | ICD-10-CM | POA: Diagnosis not present

## 2020-11-08 DIAGNOSIS — Y900 Blood alcohol level of less than 20 mg/100 ml: Secondary | ICD-10-CM | POA: Insufficient documentation

## 2020-11-08 DIAGNOSIS — F17210 Nicotine dependence, cigarettes, uncomplicated: Secondary | ICD-10-CM | POA: Insufficient documentation

## 2020-11-08 DIAGNOSIS — R443 Hallucinations, unspecified: Secondary | ICD-10-CM

## 2020-11-08 DIAGNOSIS — I129 Hypertensive chronic kidney disease with stage 1 through stage 4 chronic kidney disease, or unspecified chronic kidney disease: Secondary | ICD-10-CM | POA: Insufficient documentation

## 2020-11-08 DIAGNOSIS — R440 Auditory hallucinations: Secondary | ICD-10-CM | POA: Insufficient documentation

## 2020-11-08 DIAGNOSIS — Z79899 Other long term (current) drug therapy: Secondary | ICD-10-CM | POA: Insufficient documentation

## 2020-11-08 DIAGNOSIS — Z794 Long term (current) use of insulin: Secondary | ICD-10-CM | POA: Diagnosis not present

## 2020-11-08 DIAGNOSIS — Z5900 Homelessness unspecified: Secondary | ICD-10-CM | POA: Insufficient documentation

## 2020-11-08 DIAGNOSIS — Z7984 Long term (current) use of oral hypoglycemic drugs: Secondary | ICD-10-CM | POA: Insufficient documentation

## 2020-11-08 DIAGNOSIS — J45909 Unspecified asthma, uncomplicated: Secondary | ICD-10-CM | POA: Insufficient documentation

## 2020-11-08 DIAGNOSIS — N1831 Chronic kidney disease, stage 3a: Secondary | ICD-10-CM | POA: Diagnosis not present

## 2020-11-08 LAB — COMPREHENSIVE METABOLIC PANEL
ALT: 19 U/L (ref 0–44)
AST: 15 U/L (ref 15–41)
Albumin: 3.6 g/dL (ref 3.5–5.0)
Alkaline Phosphatase: 80 U/L (ref 38–126)
Anion gap: 11 (ref 5–15)
BUN: 15 mg/dL (ref 8–23)
CO2: 21 mmol/L — ABNORMAL LOW (ref 22–32)
Calcium: 9.4 mg/dL (ref 8.9–10.3)
Chloride: 106 mmol/L (ref 98–111)
Creatinine, Ser: 1.43 mg/dL — ABNORMAL HIGH (ref 0.61–1.24)
GFR, Estimated: 55 mL/min — ABNORMAL LOW (ref >60)
Glucose, Bld: 321 mg/dL — ABNORMAL HIGH (ref 70–99)
Potassium: 4.4 mmol/L (ref 3.5–5.1)
Sodium: 138 mmol/L (ref 135–145)
Total Bilirubin: 0.3 mg/dL (ref 0.3–1.2)
Total Protein: 7.7 g/dL (ref 6.5–8.1)

## 2020-11-08 LAB — CBC WITH DIFFERENTIAL/PLATELET
Abs Immature Granulocytes: 0.02 10*3/uL (ref 0.00–0.07)
Basophils Absolute: 0.1 10*3/uL (ref 0.0–0.1)
Basophils Relative: 1 %
Eosinophils Absolute: 0.1 10*3/uL (ref 0.0–0.5)
Eosinophils Relative: 2 %
HCT: 41.6 % (ref 39.0–52.0)
Hemoglobin: 12.6 g/dL — ABNORMAL LOW (ref 13.0–17.0)
Immature Granulocytes: 0 %
Lymphocytes Relative: 31 %
Lymphs Abs: 1.9 10*3/uL (ref 0.7–4.0)
MCH: 27.1 pg (ref 26.0–34.0)
MCHC: 30.3 g/dL (ref 30.0–36.0)
MCV: 89.5 fL (ref 80.0–100.0)
Monocytes Absolute: 0.4 10*3/uL (ref 0.1–1.0)
Monocytes Relative: 7 %
Neutro Abs: 3.7 10*3/uL (ref 1.7–7.7)
Neutrophils Relative %: 59 %
Platelets: 261 10*3/uL (ref 150–400)
RBC: 4.65 MIL/uL (ref 4.22–5.81)
RDW: 13.9 % (ref 11.5–15.5)
WBC: 6.2 10*3/uL (ref 4.0–10.5)
nRBC: 0 % (ref 0.0–0.2)

## 2020-11-08 LAB — ETHANOL: Alcohol, Ethyl (B): <10 mg/dL (ref <10)

## 2020-11-08 LAB — ACETAMINOPHEN LEVEL: Acetaminophen (Tylenol), Serum: <10 ug/mL — ABNORMAL LOW (ref 10–30)

## 2020-11-08 LAB — SALICYLATE LEVEL: Salicylate Lvl: <7 mg/dL — ABNORMAL LOW (ref 7.0–30.0)

## 2020-11-08 MED ORDER — ACETAMINOPHEN 325 MG PO TABS
650.0000 mg | ORAL_TABLET | Freq: Once | ORAL | Status: AC
  Administered 2020-11-08: 650 mg via ORAL
  Filled 2020-11-08: qty 2

## 2020-11-08 NOTE — BH Assessment (Signed)
Dylan Jordan is a 65 year old male presenting voluntary to [facility] due to auditory hallucinations and requesting food. Patient denied SI, HI and self-harming behaviors. Patient reported hearing voices "can't figure them out, paranoid", patient doesn't know what voices are saying. Patient reported taking prescribed psych medications and states medications are working. Patient was very demanding during assessment, yelling and talking over TTS clinician and provider. Patient demanded TTS clinician and provider seat down. Patient speech was garbled at times. Patient was seen at [facility] earlier today and discharged with resources, a bus ticket and will follow up with [facility] ACT Team. Patient was escorted off [facility] property and then went to [facility] where he was discharged and recommended to follow-up with ACT Team.  Routine

## 2020-11-08 NOTE — ED Triage Notes (Signed)
Pt reports A/V hallucinations, and homicidal ideations. Denies SI, etoh/drug use.

## 2020-11-08 NOTE — BH Assessment (Signed)
Pt BIB [employer] voluntarily with chief complaint of paranoia, AH and HI. Pt repots feeling paranoid and having AH last Friday. Pt states that he is unable to understand what voices are saying however denies voices telling him to harm self or others. Pt reports HI towards his sister and niece whom he does not have contact with. Pt denies knowing where his family live. Pt mumbling making it difficult for TTS to understand him. Pt coughing loud and TTS had difficult time waking him for triage. Pt reports being homeless for "too long". Pt denies VH and substance use. Reports taking [place] and zoloft.   Pt is urgent

## 2020-11-08 NOTE — ED Provider Notes (Signed)
Emergency Medicine Provider Triage Evaluation Note  Dylan Jordan , a 65 y.o. male  was evaluated in triage.  Pt complains of hallucinations and hi. Denies si.  Review of Systems  Positive: Hallucinations, hi Negative: si  Physical Exam  BP (!) 148/88 (BP Location: Right Arm)   Pulse 88   Temp 98.7 F (37.1 C)   Resp 18   SpO2 100%  Gen:   Awake, no distress   Resp:  Normal effort  MSK:   Moves extremities without difficulty   Medical Decision Making  Medically screening exam initiated at 5:41 PM.  Appropriate orders placed.  Dylan Jordan was informed that the remainder of the evaluation will be completed by another provider, this initial triage assessment does not replace that evaluation, and the importance of remaining in the ED until their evaluation is complete.     [name], PA-C 11/08/20 1742    Dylan Logan, DO 11/11/20 1109

## 2020-11-08 NOTE — Consult Note (Signed)
Chez Bulnes 65 y.o. male was just seen at [facility] 11/08/20.  He was psychiatrically cleared and discharged with resources.  Patient was given a bus ticket.  Patient left [facility] and went to [facility] ED  Patient malingering for secondary gain wanting a place to stay.  Patient seen by TTS, Child psychotherapist, and a provider.  Resources were given and psychiatrically cleared.  Patient also has a ACTT team with [facility].    Per Provider assessment at [facility] 11/08/20:  Reviewed by this provider History of Present illness: Dylan Jordan is a 65 y.o. male patient presented to [facility] as a walk in brought in voluntarily by [employer] with complaints of "I need housing"   Dylan Jordan, 65 y.o., male patient seen face to face by this provider, consulted with Dr. Bronwen Betters; and chart reviewed on 11/08/20.   During evaluation Dylan Jordan is in sitting position in no acute distress.He is alert, oriented x 4. He is non cooperative with questions at times. He is anxious with congruent affect.  He makes good eye contact. He gets agitated with questions and states "I don't know" to multiple questions. His speech is loud and pressured at times. States he has a problem with drool and he is missing teeth, which makes his voice garbled/slurred at times. He does not appear to be responding to internal/external stimuli or delusional thoughts. Patient talks over this provider and refuses to answer some questions. Patient is argumentative. Patient denies suicidal/self-harm/homicidal ideation.  Denies access to firearms/weapons.   Patient is inconsistent when discussing his current symptoms. Stated to counselor on intake he was homicidal. Denies being homicidal with this Clinical research associate. States he is paranoid but refuses to answer any questions pertaining to what he is paranoid about. States he has auditory hallucinations, he hears voices but when asked to elaborate he states, "I don't know". He refused to answer. States, "I need to be  in a room on a ward". States if he is discharged he has no where to go, "I am homeless'. Patient expressed his frustration with being his age and being homeless. States, "I need a place to stay". States he can not stay in a shelter because he takes medications and people steal from him. Sates, "I need some one to help me get a place to stay and to help me get a check each month". Explained to patient we do not handle housing, patient states "then what do you offer".   Reports he has ACTT, but refuses to let counselor contact them. States, "they don't do nothing for me". Denies any other out patient psychiatric services. States he sees [facility] and last visit was 08/2020. Denies any alcohol or substance use. States he takes [place] and zoloft but refuses to tell this Clinical research associate who prescribes medications.    Disposition:  Psychiatrically cleared No evidence of imminent risk to self or others at present.   Patient does not meet criteria for psychiatric inpatient admission. Supportive therapy provided about ongoing stressors. Refer to IOP. Discussed crisis plan, support from social network, calling 911, coming to the Emergency Department, and calling Suicide Hotline.   SW referral made to help patient with CST team referral.   Patient was given a bus ticket and discharged.     There is sufficient evidence that patient is malingering for secondary gain.  Patient seen earlier today and assessed by Nurse Practitioner, TTS counselor, and consulted with Dr. Earlene Plater.  Patient has available resources and outpatient  psychiatric services available at this time.  Patient presented with inconsistent complaints and changing with each person he spoke with. Patient refused to allow staff to contact ACTT team member.  Patient asking for a place to stay.        There is no evidence of a psychiatric condition which is amendable to inpatient psychiatric hospitalization.  Patients' suicidal/homicidal  ideation and psychosis appears to be conditional and is a means of manipulation and attention seeking without a true intention to harm himself or others.  Patient did not appear to be responding to internal or external stimuli.  His behavior is more consistent with someone who is acting in self-preservation, rather than someone who is acutely suicidal.  Given all that is stated above and including the fact that the patient shows no signs of mania or psychosis and has a liner, coherent thought process throughout the interview today, the patient does not meet criteria for inpatient psychiatric treatment or further psychiatric evaluation at this time.  Patient would benefit from following up with his ACTT team and current outpatient psychiatric provider and resources that were given at discharge from [facility].    Consulted with Dr. Nelly Rout an she is in agreement that patient's disposition is the same:  Psychiatrically cleared and no need for reassessment by TTS counselor or Psychiatric provider.     Sent a secure message to [place], [employer], PA-C informing: Patient was just seen at [facility].  He was psychiatric cleared and discharged around 3:30 PM today 11/08/20.  Patient was assessed by Nurse Practitioner, TTS counselor, and consulted with Dr. Earlene Plater.  He was given resources and has a ACTT team.  Patient malingering for secondary gain asking for a place to stay.  There is no need for a second TTS consult or psychiatric evaluation less than 3 hours after he was last seen.  Consulted with Dr. Nelly Rout about related to patient presenting to [facility] ED after discharge and disposition continues to be psychiatrically cleared.  No need for TTS or psychiatric consult.    Dylan Grant B. Katrenia Alkins, NP

## 2020-11-08 NOTE — Discharge Instructions (Addendum)

## 2020-11-08 NOTE — ED Provider Notes (Signed)
[facility] EMERGENCY DEPARTMENT Provider Note   CSN: [id number] Arrival date & time: 11/08/20  1623     History Chief Complaint  Patient presents with   Hallucinations    Dylan Jordan is a 65 y.o. male.  HPI   65 y/o male with a history of anemia, asthma, diabetes, gout, hyperlipidemia, HHS, hypertension, schizophrenia, homelessness, who presents to the emergency department today for evaluation of hallucinations.  Patient states he is hearing voices.  He denies SI.  He made vague statements of wanting to harm "everyone except somebody" but did not report any specific person he wanted to harm or any specific plan.  He does not report any medical complaints at this time.  On chart review it was noted the patient was evaluated earlier today at behavioral health urgent care and was discharged and felt to be psychiatrically cleared.  He was advised to follow-up with his act team.  Past Medical History:  Diagnosis Date   Anemia    Asthma    Diabetes mellitus without complication (HCC)    Gout    Hyperlipidemia    Hyperosmolar hyperglycemic coma due to diabetes mellitus without ketoacidosis (HCC)    Hypertension    Schizophrenia (HCC) 07/06/2018    Patient Active Problem List   Diagnosis Date Noted   Homelessness 09/05/2020   Stage 3a chronic kidney disease (HCC) 04/27/2020   Slow transit constipation 04/27/2020   Noncompliance with diabetes treatment 04/20/2020   Uncontrolled type 2 diabetes mellitus with hyperglycemia (HCC) 04/20/2020   Hyperkalemia, diminished renal excretion 03/29/2020   Hyperkalemia    Schizoaffective disorder, bipolar type (HCC) 02/21/2019   Hallucinations    Anxiety 09/18/2018   Swelling of both lower extremities 09/13/2018   Type 2 diabetes mellitus without complication, with long-term current use of insulin (HCC)    Anemia    Hyperlipidemia 12/18/2017   Mixed stress and urge urinary incontinence 11/25/2017   Intermittent chest  pain 11/25/2017   Allergic rhinitis with a nonallergic component 07/30/2017   Food allergy 07/30/2017   Chronic gout involving toe of left foot without tophus 05/18/2017   Hemorrhoids 05/04/2017   Chronic low back pain 01/03/2017   Chronic cough 06/30/2016   Drug-induced mood disorder (HCC) 06/14/2016   Type 2 diabetes mellitus with diabetic neuropathy (HCC) 05/26/2016   Iron deficiency anemia 05/26/2016   Chronic kidney disease 05/26/2016   HTN (hypertension) 05/26/2016   Tobacco abuse 05/26/2016   History of substance abuse (HCC) 05/26/2016   History of alcohol abuse 05/26/2016   Mild persistent asthma 05/26/2016    Past Surgical History:  Procedure Laterality Date   REPLACEMENT TOTAL KNEE Left    TOE SURGERY         Family History  Adopted: Yes  Problem Relation Age of Onset   Allergic rhinitis Neg Hx    Angioedema Neg Hx    Asthma Neg Hx    Atopy Neg Hx    Eczema Neg Hx    Immunodeficiency Neg Hx    Urticaria Neg Hx     Social History   Tobacco Use   Smoking status: Some Days    Packs/day: 0.30    Years: 45.00    Pack years: 13.50    Types: Cigarettes    Start date: 05/16/1971   Smokeless tobacco: Never   Tobacco comments:    Decreased Intake to 10 cigarettes per week or less.  Vaping Use   Vaping Use: Never used  Substance Use Topics  Alcohol use: No   Drug use: No    Home Medications Prior to Admission medications   Medication Sig Start Date End Date Taking? Authorizing Provider  albuterol (VENTOLIN HFA) 108 (90 Base) MCG/ACT inhaler Inhale 1-2 puffs into the lungs every 4 (four) hours as needed for wheezing or shortness of breath. 10/29/20   Willy Eddy, MD  amLODipine (NORVASC) 10 MG tablet Take 1 tablet (10 mg total) by mouth daily. 10/29/20   Willy Eddy, MD  atorvastatin (LIPITOR) 20 MG tablet Take 1 tablet (20 mg total) by mouth at bedtime. 10/29/20 12/28/20  Willy Eddy, MD  benztropine (COGENTIN) 1 MG tablet Take 1 tablet (1 mg  total) by mouth 2 (two) times daily. 10/29/20   Willy Eddy, MD  desmopressin (DDAVP) 0.2 MG tablet Take 1 tablet (0.2 mg total) by mouth at bedtime. 10/29/20   Willy Eddy, MD  ferrous sulfate 325 (65 FE) MG tablet Take 1 tablet (325 mg total) by mouth every other day. 10/29/20   Willy Eddy, MD  gabapentin (NEURONTIN) 300 MG capsule Take 2 capsules (600 mg total) by mouth 3 (three) times daily. 10/29/20 11/28/20  Willy Eddy, MD  insulin aspart (NOVOLOG) 100 UNIT/ML injection Inject 9 Units into the skin 3 (three) times daily with meals. 10/29/20   Willy Eddy, MD  insulin glargine (LANTUS) 100 UNIT/ML injection Inject 0.4 mLs (40 Units total) into the skin daily. 10/29/20   Willy Eddy, MD  lisinopril (ZESTRIL) 10 MG tablet Take 1 tablet (10 mg total) by mouth daily. 10/29/20 11/28/20  Willy Eddy, MD  Melatonin 10 MG TABS Take 10 mg by mouth at bedtime. 10/29/20   Willy Eddy, MD  metformin (FORTAMET) 1000 MG (OSM) 24 hr tablet Take 1 tablet (1,000 mg total) by mouth daily with breakfast. 10/29/20   Willy Eddy, MD  nicotine (NICODERM CQ - DOSED IN MG/24 HOURS) 21 mg/24hr patch Place 1 patch (21 mg total) onto the skin daily. 10/29/20   Willy Eddy, MD  OLANZapine (ZYPREXA) 10 MG tablet Take 1 tablet (10 mg total) by mouth in the morning and at bedtime. 10/29/20 11/28/20  Willy Eddy, MD  paliperidone (INVEGA) 9 MG 24 hr tablet Take 1 tablet (9 mg total) by mouth daily. 10/29/20   Willy Eddy, MD  pantoprazole (PROTONIX) 40 MG tablet Take 1 tablet (40 mg total) by mouth daily. 10/29/20   Willy Eddy, MD    Allergies    Chantix [varenicline tartrate], Ativan [lorazepam], Other, Pineapple, and Penicillins  Review of Systems   Review of Systems  Constitutional:  Negative for chills and fever.  HENT:  Negative for ear pain and sore throat.   Eyes:  Negative for pain and visual disturbance.  Respiratory:  Negative for cough and  shortness of breath.   Cardiovascular:  Negative for chest pain.  Gastrointestinal:  Negative for abdominal pain, constipation, diarrhea, nausea and vomiting.  Genitourinary:  Negative for dysuria and hematuria.  Musculoskeletal:  Negative for back pain.  Skin:  Negative for rash.  Neurological:  Negative for seizures and syncope.  Psychiatric/Behavioral:  Positive for hallucinations. Negative for suicidal ideas.   All other systems reviewed and are negative.  Physical Exam Updated Vital Signs BP (!) 148/88 (BP Location: Right Arm)   Pulse 88   Temp 98.7 F (37.1 C)   Resp 18   SpO2 100%   Physical Exam Vitals and nursing note reviewed.  Constitutional:      Appearance: He is well-developed.  HENT:  Head: Normocephalic and atraumatic.  Eyes:     Conjunctiva/sclera: Conjunctivae normal.  Cardiovascular:     Rate and Rhythm: Normal rate and regular rhythm.     Heart sounds: No murmur heard. Pulmonary:     Effort: Pulmonary effort is normal. No respiratory distress.     Breath sounds: Normal breath sounds.  Abdominal:     General: Bowel sounds are normal.     Palpations: Abdomen is soft.     Tenderness: There is no abdominal tenderness.  Musculoskeletal:     Cervical back: Neck supple.  Skin:    General: Skin is warm and dry.  Neurological:     Mental Status: He is alert.  Psychiatric:        Attention and Perception: He is inattentive. He perceives auditory hallucinations.        Thought Content: Thought content does not include suicidal ideation. Thought content does not include homicidal or suicidal plan.    ED Results / Procedures / Treatments   Labs (all labs ordered are listed, but only abnormal results are displayed) Labs Reviewed  COMPREHENSIVE METABOLIC PANEL - Abnormal; Notable for the following components:      Result Value   CO2 21 (*)    Glucose, Bld 321 (*)    Creatinine, Ser 1.43 (*)    GFR, Estimated 55 (*)    All other components within  normal limits  CBC WITH DIFFERENTIAL/PLATELET - Abnormal; Notable for the following components:   Hemoglobin 12.6 (*)    All other components within normal limits  ACETAMINOPHEN LEVEL - Abnormal; Notable for the following components:   Acetaminophen (Tylenol), Serum <10 (*)    All other components within normal limits  SALICYLATE LEVEL - Abnormal; Notable for the following components:   Salicylate Lvl <7.0 (*)    All other components within normal limits  ETHANOL    EKG None  Radiology No results found.  Procedures Procedures   Medications Ordered in ED Medications  acetaminophen (TYLENOL) tablet 650 mg (650 mg Oral Given 11/08/20 1933)    ED Course  I have reviewed the triage vital signs and the nursing notes.  Pertinent labs & imaging results that were available during my care of the patient were reviewed by me and considered in my medical decision making (see chart for details).    MDM Rules/Calculators/A&P                          65 year old male presenting the emergency department today for evaluation of hallucinations.  He was seen at behavioral urgent care earlier today and was cleared by psychiatry.  His laboratory work today is reassuring.  He is not reporting SI does not have any plan to harm others.  He is experiencing hallucinations but does not appear to pose any threat to himself or others at this time and feel he is appropriate for discharge.  Do not feel he requires repeat psychiatric evaluation today.  It is recommended that he follow-up with his act team and his behavioral health care provider.  Final Clinical Impression(s) / ED Diagnoses Final diagnoses:  Hallucinations    Rx / DC Orders ED Discharge Orders     None        Rayne Du 11/08/20 2006    Terrilee Files, MD 11/09/20 1018

## 2020-11-08 NOTE — BHH Counselor (Signed)
Pt is refusing to leave premises of the [facility]. [name] and [facility] provider staff have provided pt. with alternative resources to leave the urgent care via [employer] or [employer].  Pt is refusing to leave and states that he "must be housed." After several attempts to request pt exit, Medical sales representative C. Contacted [employer] to inform them of pt. trespassing.   Shyana Kulakowski [place], MSW, LCSW-A 6/27/20223:45 PM

## 2020-11-08 NOTE — BH Assessment (Addendum)
Comprehensive Clinical Assessment (CCA) Note  11/08/2020 Dylan Jordan [id number]  Disposition: Per Vernard Gambles, NP, patient is psychiatrically cleared and recommended for discharge.   Flowsheet Row ED from 11/08/2020 in [facility] ED from 10/28/2020 in [facility] Admission (Discharged) from 10/20/2020 in [facility] INPATIENT BEHAVIORAL MEDICINE  C-SSRS RISK CATEGORY No Risk Error: Q3, 4, or 5 should not be populated when Q2 is No Error: Q3, 4, or 5 should not be populated when Q2 is No      The patient demonstrates the following risk factors for suicide: Chronic risk factors for suicide include: psychiatric disorder of schizophrenia and demographic factors (male, >16 y/o). Acute risk factors for suicide include: family or marital conflict, unemployment, loss (financial, interpersonal, professional), and recent discharge from inpatient psychiatry. Protective factors for this patient include:  none . Considering these factors, the overall suicide risk at this point appears to be low. Patient is appropriate for outpatient follow up.    Dylan Jordan is a 65 year old male presenting to [facility] voluntarily with chief complaint of AH and paranoia. Patient unable to express details of paranoia and AH and state that it is noises that he cannot make out. Patient denies command hallucination. Patient is very agitated and does not want TTS to speak. Patient becomes very agitated when discussing his prior ACT provider. Patient states he does not want anything to do with his last ACT team because they did not do what they said they were going to do. Patient reports needing housing and stating that he wants to go to a nice room on a "ward". Patient reports being in a shelter but was unable to stay due to him taking too many medications and having mental health issues per patient report. Patient is willing to go to a shelter. Patient denies SI, HI,  AVH. Patient reports that his family is not helpful and does not care about him, however, denies HI towards anyone during assessment with NP. Patient refuses to let TTS call his prior ACT provider to get collateral information. Patient is agreeable to receive other enhanced services but none with ACT providers. Social work to discuss enhanced services and housing options.    Chief Complaint:  Chief Complaint  Patient presents with   Schizophrenia    Pt reports AH, paranoia and homelessness.    Visit Diagnosis: Schizoaffective, bipolar type    CCA Screening, Triage and Referral (STR)  Patient Reported Information How did you hear about [place]? Self  What Is the Reason for Your Visit/Call Today? Paranoid and AH and HI  How Long Has This Been Causing You Problems? 1 wk - 1 month  What Do You Feel Would Help You the Most Today? Housing Assistance; Treatment for Depression or other mood problem   Have You Recently Had Any Thoughts About Hurting Yourself? No  Are You Planning to Commit Suicide/Harm Yourself At This time? No   Have you Recently Had Thoughts About Hurting Someone Dylan Jordan? Yes  Are You Planning to Harm Someone at This Time? No  Explanation: No data recorded  Have You Used Any Alcohol or Drugs in the Past 24 Hours? No  How Long Ago Did You Use Drugs or Alcohol? No data recorded What Did You Use and How Much? No data recorded  Do You Currently Have a Therapist/Psychiatrist? No  Name of Therapist/Psychiatrist: No data recorded  Have You Been Recently Discharged From Any Office Practice or Programs? No  Explanation of Discharge From  Practice/Program: No data recorded    CCA Screening Triage Referral Assessment Type of Contact: Face-to-Face  Telemedicine Service Delivery:   Is this Initial or Reassessment? Initial Assessment  Date Telepsych consult ordered in [facility]:  10/17/20  Time Telepsych consult ordered in [facility]:  1308  Location of Assessment: [facility] ED  Provider  Location: No data recorded  Collateral Involvement: NA   Does Patient Have a Court Appointed Legal Guardian? No data recorded Name and Contact of Legal Guardian: self  If Minor and Not Living with Parent(s), Who has Custody? n/a  Is CPS involved or ever been involved? Never  Is APS involved or ever been involved? Never   Patient Determined To Be At Risk for Harm To Self or Others Based on Review of Patient Reported Information or Presenting Complaint? No  Method: No data recorded Availability of Means: No data recorded Intent: No data recorded Notification Required: No data recorded Additional Information for Danger to Others Potential: No data recorded Additional Comments for Danger to Others Potential: No data recorded Are There Guns or Other Weapons in Your Home? No data recorded Types of Guns/Weapons: No data recorded Are These Weapons Safely Secured?                            No data recorded Who Could Verify You Are Able To Have These Secured: No data recorded Do You Have any Outstanding Charges, Pending Court Dates, Parole/Probation? No data recorded Contacted To Inform of Risk of Harm To Self or Others: No data recorded   Does Patient Present under Involuntary Commitment? No  IVC Papers Initial File Date: No data recorded  [place] of Residence: [place]   Patient Currently Receiving the Following Services: ACTT Psychologist, educational)   Determination of Need: Urgent (48 hours)   Options For Referral: Medication Management; Outpatient Therapy     CCA Biopsychosocial Patient Reported Schizophrenia/Schizoaffective Diagnosis in Past: Yes   Strengths: Community support   Mental Health Symptoms Depression:   None   Duration of Depressive symptoms:    Mania:   Increased Energy; Racing thoughts   Anxiety:    None   Psychosis:   Grossly disorganized speech; Hallucinations   Duration of Psychotic symptoms:  Duration of Psychotic Symptoms:  Greater than six months   Trauma:   N/A   Obsessions:   N/A   Compulsions:   N/A   Inattention:   N/A   Hyperactivity/Impulsivity:   N/A   Oppositional/Defiant Behaviors:   N/A   Emotional Irregularity:   N/A   Other Mood/Personality Symptoms:  No data recorded   Mental Status Exam Appearance and self-care  Stature:   Average   Weight:   Average weight   Clothing:   Casual   Grooming:   Normal   Cosmetic use:   None   Posture/gait:   Normal   Motor activity:   Agitated   Sensorium  Attention:   Normal   Concentration:   Focuses on irrelevancies; Preoccupied   Orientation:   X5   Recall/memory:   Defective in Short-term; Defective in Recent   Affect and Mood  Affect:   Negative   Mood:   Irritable   Relating  Eye contact:   Fleeting   Facial expression:   Responsive   Attitude toward examiner:   Argumentative; Suspicious; Irritable; Sarcastic   Thought and Language  Speech flow:  Articulation error; Flight of Ideas; Garbled; Pressured   Thought  content:   Suspicious   Preoccupation:  No data recorded  Hallucinations:   Auditory   Organization:  No data recorded  [employer] of Knowledge:   Fair   Intelligence:   Average   Abstraction:   Concrete   Judgement:   Fair   Reality Testing:   Distorted   Insight:   Poor   Decision Making:   Impulsive   Social Functioning  Social Maturity:   Impulsive   Social Judgement:   Impropriety   Stress  Stressors:   Housing; Illness; Family conflict   Coping Ability:   Exhausted   Skill Deficits:  No data recorded  Supports:   Friends/Service system     Religion: Religion/Spirituality Are You A Religious Person?: No  Leisure/Recreation: Leisure / Recreation Do You Have Hobbies?: No  Exercise/Diet: Exercise/Diet Have You Gained or Lost A Significant Amount of Weight in the Past Six Months?: No Do You Follow a Special Diet?: No Do  You Have Any Trouble Sleeping?: Yes Explanation of Sleeping Difficulties: Pt endorsed ''no sleep''   CCA Employment/Education Employment/Work Situation: Employment / Work Situation Employment Situation: Unemployed (it is unclear if patient is currently on SSDI) Patient's Job has Been Impacted by Current Illness: Yes (Patient has difficulty following simple instructions, becomes easily agitated, and often uses agression to achieve needs.) Has Patient ever Been in the [employer]?: No  Education:     CCA Family/Childhood History Family and Relationship History: Family history Marital status: Single Does patient have children?: No  Childhood History:  Childhood History By whom was/is the patient raised?:  (Patient reffuses to answer, repeating himself saying "i do not know." However, patient denies memory impairment.) Description of patient's current relationship with siblings: [name] aware of 1 sister whom he calls while on the unit. Did patient suffer any verbal/emotional/physical/sexual abuse as a child?:  (unknown) Did patient suffer from severe childhood neglect?:  (unknown) Has patient ever been sexually abused/assaulted/raped as an adolescent or adult?:  (unknown) Was the patient ever a victim of a crime or a disaster?:  (unknown) Witnessed domestic violence?:  (unknown) Has patient been affected by domestic violence as an adult?:  (unknown)  Child/Adolescent Assessment:     CCA Substance Use Alcohol/Drug Use: Alcohol / Drug Use Pain Medications: Please see MAR Prescriptions: Please see MAR Over the Counter: Please see MAR History of alcohol / drug use?: No history of alcohol / drug abuse (Per history; Pt could not be adequately assessed due to AMS)                         ASAM's:  Six Dimensions of Multidimensional Assessment  Dimension 1:  Acute Intoxication and/or Withdrawal Potential:      Dimension 2:  Biomedical Conditions and Complications:       Dimension 3:  Emotional, Behavioral, or Cognitive Conditions and Complications:     Dimension 4:  Readiness to Change:     Dimension 5:  Relapse, Continued use, or Continued Problem Potential:     Dimension 6:  Recovery/Living Environment:     ASAM Severity Score:    ASAM Recommended Level of Treatment:     Substance use Disorder (SUD)    Recommendations for Services/Supports/Treatments:    Discharge Disposition:    DSM5 Diagnoses: Patient Active Problem List   Diagnosis Date Noted   Homelessness 09/05/2020   Stage 3a chronic kidney disease (HCC) 04/27/2020   Slow transit constipation 04/27/2020   Noncompliance  with diabetes treatment 04/20/2020   Uncontrolled type 2 diabetes mellitus with hyperglycemia (HCC) 04/20/2020   Hyperkalemia, diminished renal excretion 03/29/2020   Hyperkalemia    Schizoaffective disorder, bipolar type (HCC) 02/21/2019   Hallucinations    Anxiety 09/18/2018   Swelling of both lower extremities 09/13/2018   Type 2 diabetes mellitus without complication, with long-term current use of insulin (HCC)    Anemia    Hyperlipidemia 12/18/2017   Mixed stress and urge urinary incontinence 11/25/2017   Intermittent chest pain 11/25/2017   Allergic rhinitis with a nonallergic component 07/30/2017   Food allergy 07/30/2017   Chronic gout involving toe of left foot without tophus 05/18/2017   Hemorrhoids 05/04/2017   Chronic low back pain 01/03/2017   Chronic cough 06/30/2016   Drug-induced mood disorder (HCC) 06/14/2016   Type 2 diabetes mellitus with diabetic neuropathy (HCC) 05/26/2016   Iron deficiency anemia 05/26/2016   Chronic kidney disease 05/26/2016   HTN (hypertension) 05/26/2016   Tobacco abuse 05/26/2016   History of substance abuse (HCC) 05/26/2016   History of alcohol abuse 05/26/2016   Mild persistent asthma 05/26/2016     Referrals to Alternative Service(s): Referred to Alternative Service(s):   Place:   Date:   Time:    Referred  to Alternative Service(s):   Place:   Date:   Time:    Referred to Alternative Service(s):   Place:   Date:   Time:    Referred to Alternative Service(s):   Place:   Date:   Time:     Audree CamelFalencio L Loney Domingo, [facility]

## 2020-11-08 NOTE — ED Provider Notes (Addendum)
Behavioral Health Urgent Care Medical Screening Exam  Patient Name: Dylan Jordan MRN: [id number] Date of Evaluation: 11/08/20 Chief Complaint:   Diagnosis:  Final diagnoses:  None    History of Present illness: Dylan Jordan is a 65 y.o. male patient presented to [facility] as a walk in brought in voluntarily by [employer] with complaints of "I need housing"  Dylan Jordan, 65 y.o., male patient seen face to face by this provider, consulted with Dr. Bronwen Betters; and chart reviewed on 11/08/20.   During evaluation Dylan Jordan is in sitting position in no acute distress.He is alert, oriented x 4. He is non cooperative with questions at times. He is anxious with congruent affect.  He makes good eye contact. He gets agitated with questions and states "I don't know" to multiple questions. His speech is loud and pressured at times. States he has a problem with drool and he is missing teeth, which makes his voice garbled/slurred at times. He does not appear to be responding to internal/external stimuli or delusional thoughts. Patient talks over this provider and refuses to answer some questions. Patient is argumentative. Patient denies suicidal/self-harm/homicidal ideation.  Denies access to firearms/weapons.   Patient is inconsistent when discussing his current symptoms. Stated to counselor on intake he was homicidal. Denies being homicidal with this Clinical research associate. States he is paranoid but refuses to answer any questions pertaining to what he is paranoid about. States he has auditory hallucinations, he hears voices but when asked to elaborate he states, "I don't know". He refused to answer. States, "I need to be in a room on a ward". States if he is discharged he has no where to go, "I am homeless'. Patient expressed his frustration with being his age and being homeless. States, "I need a place to stay". States he can not stay in a shelter because he takes medications and people steal from him. Sates, "I need some one to  help me get a place to stay and to help me get a check each month". Explained to patient we do not handle housing, patient states "then what do you offer".   Reports he has ACTT, but refuses to let counselor contact them. States, "they don't do nothing for me". Denies any other out patient psychiatric services. States he sees [facility] and last visit was 08/2020. Denies any alcohol or substance use. States he takes [place] and zoloft but refuses to tell this Clinical research associate who prescribes medications.   SW referral made to help patient with CST team referral.    Psychiatric Specialty Exam  Presentation  General Appearance:Casual  Eye Contact:Good  Speech:Clear and Coherent; Pressured; Garbled  Speech Volume:Increased (at times)  Handedness:Right   Mood and Affect  Mood:Anxious  Affect:Congruent   Thought Process  Thought Processes:Coherent  Descriptions of Associations:Intact  Orientation:Full (Time, Place and Person)  Thought Content:Logical  Diagnosis of Schizophrenia or Schizoaffective disorder in past: Yes  Duration of Psychotic Symptoms: Greater than six months  Hallucinations:None; Auditory states he hears voices but doesnt know if they say anything  Ideas of Reference:None  Suicidal Thoughts:No  Homicidal Thoughts:No   Sensorium  Memory:Immediate Good; Recent Good; Remote Good  Judgment:Fair  Insight:Fair   Executive Functions  Concentration:Good  Attention Span:Good  Recall:Good  Fund of Knowledge:Good  Language:Good   Psychomotor Activity  Psychomotor Activity:Normal   Assets  Assets:Communication Skills; Desire for Improvement; Resilience; Physical Health; Social Support   Sleep  Sleep:Fair  Number of hours: 5   No data recorded  Physical  Exam: Physical Exam Vitals and nursing note reviewed.  Constitutional:      Appearance: He is well-developed.  HENT:     Head: Normocephalic and atraumatic.  Eyes:      Conjunctiva/sclera: Conjunctivae normal.  Cardiovascular:     Rate and Rhythm: Normal rate and regular rhythm.     Heart sounds: No murmur heard. Pulmonary:     Effort: Pulmonary effort is normal. No respiratory distress.     Breath sounds: Normal breath sounds.  Abdominal:     Tenderness: There is no abdominal tenderness. There is no guarding.  Musculoskeletal:        General: Normal range of motion.     Cervical back: Normal range of motion.  Skin:    General: Skin is warm and dry.  Neurological:     Mental Status: He is alert and oriented to person, place, and time.  Psychiatric:        Attention and Perception: Attention normal. He perceives auditory hallucinations.        Mood and Affect: Mood is anxious.        Speech: Speech is rapid and pressured.        Behavior: Behavior normal.        Thought Content: Thought content normal.        Cognition and Memory: Cognition normal.        Judgment: Judgment is impulsive.   Review of Systems  Constitutional: Negative.   HENT: Negative.    Eyes: Negative.   Respiratory: Negative.    Cardiovascular: Negative.   Musculoskeletal: Negative.   Skin: Negative.   Neurological: Negative.   Psychiatric/Behavioral:  Positive for hallucinations. The patient is nervous/anxious.   Blood pressure 129/69, pulse 100, temperature 99.2 F (37.3 C), temperature source Oral, resp. rate 16, SpO2 98 %. There is no height or weight on file to calculate BMI.  Musculoskeletal: Strength & Muscle Tone: within normal limits Gait & Station: normal Patient leans: N/A   [facility] MSE Discharge Disposition for Follow up and Recommendations: Based on my evaluation the patient does not appear to have an emergency medical condition and can be discharged with resources and follow up care in outpatient services for Shelters and CST team referral.   No evidence of imminent risk to self or others at present.    Patient does not meet criteria for psychiatric  inpatient admission. Discussed crisis plan, support from social network, calling 911, coming to the Emergency Department, and calling Suicide Hotline.   Patient given bus pass and discharged.   Ardis Hughs, NP 11/08/2020, 2:11 PM

## 2020-11-08 NOTE — ED Notes (Signed)
Dylan Jordan to be D/C'd home per NP order. Discussed with patient and all questions answered. After visit summary printed and given to patient. Patient escorted out and given bus pass to D/C home via bus. [employer] 11/08/2020

## 2020-11-08 NOTE — ED Provider Notes (Addendum)
Behavioral Health Urgent Care Medical Screening Exam  Patient Name: Dylan Jordan MRN: [id number] Date of Evaluation: 11/09/20 Chief Complaint:   Diagnosis:  Final diagnoses:  Schizoaffective disorder, bipolar type (HCC)  Homelessness  Hallucination    History of Present illness: Dylan Jordan "Dylan Jordan" is a 65 y.o. male. Patient presented voluntarily to [facility] via law enforcement with chief complaint of auditory hallucination and hunger. Patient was evaluated here at [facility] earlier and discharge with recommendation to follow with his ACT Team. Patient then went to [facility] with similar complaint, he was evaluated and this discharged. The presented back to [facility] with the above complaint.   Patient was assessed by this NP. Patient is alert and oriented X4; on approach patient is yelling and agitated at staff. He expressed that he is homeless and frustrated due to lack of housing. He is endorsing auditory hallucination of hearing "voices that I can't make out what they are saying." He report that auditory hallucination is not new or worse from baseline. He reports compliance with psychotropics. Per chart review, his prescriptions were last refilled/sent to [facility] by Dr. Neale Burly on 10/29/20. Patient denied difficulty obtaining medications. He reports that his medications are helpful in controlling and relieving auditory hallucination. He denied any medical complaint. No chest pain, SOB, or acute distress.   He denies self-harming, SI, HI, VH, paranoia, and no delusional thought contenet noted during this assessment. Patient denied HI but did express that he wants to beat up his niece for stealing his money and because she is involved in illegal activity; he informed this Clinical research associate that his niece has Tax inspector. He becomes upset during assessment and begins to yell and talk over TTS counselor as well as this Clinical research associate. He then demanded both TTS Counselor and Clinical research associate to seat down and begins to  ramble about women feeling like they are in control and in charge. He continued to deny SI/HI/paranioa, access to weapon/firearm.   Patient was once again referred back to his ACT Team and informed to follow up outpatient as he has no new complaint or acute psychiatric complaint. When nursing staff attempted to discharge patient, patient became irate, slamming his hands on the table and attempting to flip over furniture. Security was called to assist. Patient was heard yelling he will not leave and wants police to take him to jail. [employer] contacted to escort off property.     Psychiatric Specialty Exam  Presentation  General Appearance:Casual  Eye Contact:Good  Speech:Garbled (pt report speech is garbled due to missing teeth. provider is able to understand pt.)  Speech Volume:Increased  Handedness:Right   Mood and Affect  Mood:Irritable  Affect:Congruent   Thought Process  Thought Processes:Coherent  Descriptions of Associations:Intact  Orientation:Full (Time, Place and Person)  Thought Content:WDL  Diagnosis of Schizophrenia or Schizoaffective disorder in past: Yes  Duration of Psychotic Symptoms: Greater than six months  Hallucinations:Auditory "I cant make out what the voices are saying"  Ideas of Reference:None  Suicidal Thoughts:No  Homicidal Thoughts:No   Sensorium  Memory:Immediate Good; Recent Good; Remote Good  Judgment:Fair  Insight:Fair   Executive Functions  Concentration:Good  Attention Span:Good  Recall:Good  Fund of Knowledge:Good  Language:Good   Psychomotor Activity  Psychomotor Activity:Normal   Assets  Assets:Desire for Improvement; Financial Resources/Insurance; Social Support   Sleep  Sleep:Fair  Number of hours: 6   No data recorded  Physical Exam: Physical Exam Vitals and nursing note reviewed.  Constitutional:      General: He is  not in acute distress.    Appearance: He is well-developed. He is not  ill-appearing or toxic-appearing.  HENT:     Head: Normocephalic and atraumatic.  Eyes:     Conjunctiva/sclera: Conjunctivae normal.  Cardiovascular:     Rate and Rhythm: Tachycardia present.  Pulmonary:     Effort: Pulmonary effort is normal. No respiratory distress.     Breath sounds: Normal breath sounds.  Abdominal:     Palpations: Abdomen is soft.     Tenderness: There is no abdominal tenderness.  Musculoskeletal:     Cervical back: Normal range of motion.  Skin:    General: Skin is warm and dry.  Neurological:     Mental Status: He is alert and oriented to person, place, and time.  Psychiatric:        Attention and Perception: He perceives auditory hallucinations. He does not perceive visual hallucinations.        Mood and Affect: Affect is angry.        Behavior: Behavior is agitated, aggressive and combative.        Thought Content: Thought content is not paranoid or delusional. Thought content does not include homicidal or suicidal ideation. Thought content does not include homicidal or suicidal plan.        Cognition and Memory: Cognition normal.        Judgment: Judgment is inappropriate.   Review of Systems  Constitutional: Negative.   HENT: Negative.    Eyes: Negative.   Respiratory:  Negative for cough, hemoptysis, shortness of breath and wheezing.   Cardiovascular:  Positive for leg swelling. Negative for chest pain and palpitations.  Gastrointestinal: Negative.   Genitourinary: Negative.   Musculoskeletal: Negative.   Skin: Negative.   Neurological: Negative.   Endo/Heme/Allergies: Negative.   Psychiatric/Behavioral:  Positive for hallucinations. Negative for depression, substance abuse and suicidal ideas.   Blood pressure (!) 138/96, pulse (!) 102, temperature 98.4 F (36.9 C), temperature source Oral, resp. rate 18, SpO2 96 %. There is no height or weight on file to calculate BMI.  Musculoskeletal: Strength & Muscle Tone: within normal limits Gait &  Station: normal Patient leans: Right   [facility] MSE Discharge Disposition for Follow up and Recommendations: Based on my evaluation the patient does not appear to have an emergency medical condition and can be discharged with resources and follow up care in outpatient services for Medication Management and Individual Therapy  Patient was once again referred back to his ACT Team and informed to follow up outpatient as he has no new complaint or acute psychiatric complaint. When nursing staff attempted to discharge patient, patient became irate, slamming his hands on the table and attempting to flip over furniture. Security was called to assist. Patient was heard yelling he will not leave and wants police to take him to jail. [employer] contacted to escort off property.    Maricela Bo, NP 11/09/2020, 12:21 AM

## 2020-11-08 NOTE — Discharge Instructions (Addendum)
Follow up with your act team and behavioral health provider

## 2020-11-08 NOTE — BHH Counselor (Signed)
[name] provided referral for pt for community support team at [facility], [place]. [name] provided a copy of form for pt to have so pt can call as needed.  Pt was provided a bus pass for transportation support upon discharge.   Damari Suastegui [place], MSW, LCSW-A 6/27/20223:34 PM

## 2020-11-09 DIAGNOSIS — F250 Schizoaffective disorder, bipolar type: Secondary | ICD-10-CM | POA: Insufficient documentation

## 2020-11-09 DIAGNOSIS — Z5900 Homelessness unspecified: Secondary | ICD-10-CM | POA: Insufficient documentation

## 2020-11-09 NOTE — Discharge Instructions (Addendum)

## 2020-11-09 NOTE — ED Notes (Signed)
This nurse went to d/c patient. Upon being awaken patient slammed his hands on the table in the room and began to yell that he is crazy and was not going to leave this facility. [employer] had to be called and patient had to be taken off campus via the police.

## 2020-11-10 ENCOUNTER — Telehealth (HOSPITAL_COMMUNITY): Payer: Self-pay

## 2020-11-10 NOTE — BH Assessment (Signed)
Care Management - Follow Up [facility] Discharges   Writer attempted to make contact with patient today and was unsuccessful.  Patient phone is disconnected.  Per chart review, Patient receives services with [facility] ACT Team.

## 2020-11-14 ENCOUNTER — Encounter (HOSPITAL_COMMUNITY): Payer: Self-pay | Admitting: Emergency Medicine

## 2020-11-14 ENCOUNTER — Emergency Department (HOSPITAL_COMMUNITY)
Admission: EM | Admit: 2020-11-14 | Discharge: 2020-11-15 | Disposition: A | Discharge status: Home / Self Care | Payer: Medicaid Other | Attending: Emergency Medicine | Admitting: Emergency Medicine

## 2020-11-14 DIAGNOSIS — Z765 Malingerer [conscious simulation]: Secondary | ICD-10-CM

## 2020-11-14 DIAGNOSIS — I129 Hypertensive chronic kidney disease with stage 1 through stage 4 chronic kidney disease, or unspecified chronic kidney disease: Secondary | ICD-10-CM | POA: Diagnosis not present

## 2020-11-14 DIAGNOSIS — Z5900 Homelessness unspecified: Secondary | ICD-10-CM | POA: Insufficient documentation

## 2020-11-14 DIAGNOSIS — Z794 Long term (current) use of insulin: Secondary | ICD-10-CM | POA: Diagnosis not present

## 2020-11-14 DIAGNOSIS — Z7984 Long term (current) use of oral hypoglycemic drugs: Secondary | ICD-10-CM | POA: Diagnosis not present

## 2020-11-14 DIAGNOSIS — F250 Schizoaffective disorder, bipolar type: Secondary | ICD-10-CM | POA: Diagnosis not present

## 2020-11-14 DIAGNOSIS — J4530 Mild persistent asthma, uncomplicated: Secondary | ICD-10-CM | POA: Insufficient documentation

## 2020-11-14 DIAGNOSIS — E1122 Type 2 diabetes mellitus with diabetic chronic kidney disease: Secondary | ICD-10-CM | POA: Insufficient documentation

## 2020-11-14 DIAGNOSIS — N1831 Chronic kidney disease, stage 3a: Secondary | ICD-10-CM | POA: Insufficient documentation

## 2020-11-14 DIAGNOSIS — R45850 Homicidal ideations: Secondary | ICD-10-CM | POA: Insufficient documentation

## 2020-11-14 DIAGNOSIS — Z008 Encounter for other general examination: Secondary | ICD-10-CM

## 2020-11-14 DIAGNOSIS — E1140 Type 2 diabetes mellitus with diabetic neuropathy, unspecified: Secondary | ICD-10-CM | POA: Diagnosis not present

## 2020-11-14 DIAGNOSIS — Z79899 Other long term (current) drug therapy: Secondary | ICD-10-CM | POA: Insufficient documentation

## 2020-11-14 DIAGNOSIS — R443 Hallucinations, unspecified: Secondary | ICD-10-CM | POA: Diagnosis present

## 2020-11-14 DIAGNOSIS — Z20822 Contact with and (suspected) exposure to covid-19: Secondary | ICD-10-CM | POA: Insufficient documentation

## 2020-11-14 DIAGNOSIS — Z96652 Presence of left artificial knee joint: Secondary | ICD-10-CM | POA: Diagnosis not present

## 2020-11-14 DIAGNOSIS — F17210 Nicotine dependence, cigarettes, uncomplicated: Secondary | ICD-10-CM | POA: Diagnosis not present

## 2020-11-14 LAB — CBC WITH DIFFERENTIAL/PLATELET
Abs Immature Granulocytes: 0.02 10*3/uL (ref 0.00–0.07)
Basophils Absolute: 0 10*3/uL (ref 0.0–0.1)
Basophils Relative: 1 %
Eosinophils Absolute: 0.2 10*3/uL (ref 0.0–0.5)
Eosinophils Relative: 3 %
HCT: 39.4 % (ref 39.0–52.0)
Hemoglobin: 12.1 g/dL — ABNORMAL LOW (ref 13.0–17.0)
Immature Granulocytes: 0 %
Lymphocytes Relative: 30 %
Lymphs Abs: 1.8 10*3/uL (ref 0.7–4.0)
MCH: 27 pg (ref 26.0–34.0)
MCHC: 30.7 g/dL (ref 30.0–36.0)
MCV: 87.9 fL (ref 80.0–100.0)
Monocytes Absolute: 0.4 10*3/uL (ref 0.1–1.0)
Monocytes Relative: 7 %
Neutro Abs: 3.5 10*3/uL (ref 1.7–7.7)
Neutrophils Relative %: 59 %
Platelets: 230 10*3/uL (ref 150–400)
RBC: 4.48 MIL/uL (ref 4.22–5.81)
RDW: 14.3 % (ref 11.5–15.5)
WBC: 6 10*3/uL (ref 4.0–10.5)
nRBC: 0 % (ref 0.0–0.2)

## 2020-11-14 LAB — ETHANOL: Alcohol, Ethyl (B): <10 mg/dL (ref <10)

## 2020-11-14 LAB — COMPREHENSIVE METABOLIC PANEL
ALT: 16 U/L (ref 0–44)
AST: 14 U/L — ABNORMAL LOW (ref 15–41)
Albumin: 3.3 g/dL — ABNORMAL LOW (ref 3.5–5.0)
Alkaline Phosphatase: 61 U/L (ref 38–126)
Anion gap: 7 (ref 5–15)
BUN: 16 mg/dL (ref 8–23)
CO2: 19 mmol/L — ABNORMAL LOW (ref 22–32)
Calcium: 9.2 mg/dL (ref 8.9–10.3)
Chloride: 113 mmol/L — ABNORMAL HIGH (ref 98–111)
Creatinine, Ser: 1.33 mg/dL — ABNORMAL HIGH (ref 0.61–1.24)
GFR, Estimated: 60 mL/min — ABNORMAL LOW (ref >60)
Glucose, Bld: 207 mg/dL — ABNORMAL HIGH (ref 70–99)
Potassium: 3.9 mmol/L (ref 3.5–5.1)
Sodium: 139 mmol/L (ref 135–145)
Total Bilirubin: 0.8 mg/dL (ref 0.3–1.2)
Total Protein: 7.1 g/dL (ref 6.5–8.1)

## 2020-11-14 LAB — RESP PANEL BY RT-PCR (FLU A&B, COVID) ARPGX2
Influenza A by PCR: NEGATIVE
Influenza B by PCR: NEGATIVE
SARS Coronavirus 2 by RT PCR: NEGATIVE

## 2020-11-14 MED ORDER — NICOTINE 21 MG/24HR TD PT24
21.0000 mg | MEDICATED_PATCH | Freq: Every day | TRANSDERMAL | Status: DC
  Administered 2020-11-15: 21 mg via TRANSDERMAL
  Filled 2020-11-14: qty 1

## 2020-11-14 MED ORDER — INSULIN ASPART 100 UNIT/ML IJ SOLN: 0.0000 [IU] | Freq: Three times a day (TID) | INTRAMUSCULAR | Status: DC

## 2020-11-14 MED ORDER — RISPERIDONE 2 MG PO TBDP
2.0000 mg | ORAL_TABLET | Freq: Three times a day (TID) | ORAL | Status: DC | PRN
  Administered 2020-11-15: 2 mg via ORAL
  Filled 2020-11-14 (×3): qty 1

## 2020-11-14 MED ORDER — ZIPRASIDONE MESYLATE 20 MG IM SOLR: 20.0000 mg | INTRAMUSCULAR | Status: DC | PRN

## 2020-11-14 NOTE — ED Triage Notes (Signed)
Pt here from outside the jail , told ems he was having hallucinations  and homicidal , denies SI hx of same

## 2020-11-14 NOTE — ED Provider Notes (Signed)
Emergency Department Provider Note   I have reviewed the triage vital signs and the nursing notes.   HISTORY  Chief Complaint No chief complaint on file.   HPI Dylan Jordan is a 65 y.o. male with PMH reviewed below including schizophrenia presents to the ED with hallucinations and HI. He was apparently walking around outside of the jail. He tell me that he wants "a pitcher of Diet Coke, cheeseburger, and sweet potato fries." He tells me that he is having hallucinations and that he is wanting to hurt people saying "I can hurt you. You think I can't hurt you?"   Level 5 caveat: Psychosis.   Past Medical History:  Diagnosis Date   Anemia    Asthma    Diabetes mellitus without complication (HCC)    Gout    Hyperlipidemia    Hyperosmolar hyperglycemic coma due to diabetes mellitus without ketoacidosis (HCC)    Hypertension    Schizophrenia (HCC) 07/06/2018    Patient Active Problem List   Diagnosis Date Noted   Homelessness 09/05/2020   Stage 3a chronic kidney disease (HCC) 04/27/2020   Slow transit constipation 04/27/2020   Noncompliance with diabetes treatment 04/20/2020   Uncontrolled type 2 diabetes mellitus with hyperglycemia (HCC) 04/20/2020   Hyperkalemia, diminished renal excretion 03/29/2020   Hyperkalemia    Schizoaffective disorder, bipolar type (HCC) 02/21/2019   Hallucinations    Anxiety 09/18/2018   Swelling of both lower extremities 09/13/2018   Type 2 diabetes mellitus without complication, with Ramsey Guadamuz-term current use of insulin (HCC)    Anemia    Hyperlipidemia 12/18/2017   Mixed stress and urge urinary incontinence 11/25/2017   Intermittent chest pain 11/25/2017   Allergic rhinitis with a nonallergic component 07/30/2017   Food allergy 07/30/2017   Chronic gout involving toe of left foot without tophus 05/18/2017   Hemorrhoids 05/04/2017   Chronic low back pain 01/03/2017   Chronic cough 06/30/2016   Drug-induced mood disorder (HCC) 06/14/2016    Type 2 diabetes mellitus with diabetic neuropathy (HCC) 05/26/2016   Iron deficiency anemia 05/26/2016   Chronic kidney disease 05/26/2016   HTN (hypertension) 05/26/2016   Tobacco abuse 05/26/2016   History of substance abuse (HCC) 05/26/2016   History of alcohol abuse 05/26/2016   Mild persistent asthma 05/26/2016    Past Surgical History:  Procedure Laterality Date   REPLACEMENT TOTAL KNEE Left    TOE SURGERY      Allergies Chantix [varenicline tartrate], Ativan [lorazepam], Other, Pineapple, and Penicillins  Family History  Adopted: Yes  Problem Relation Age of Onset   Allergic rhinitis Neg Hx    Angioedema Neg Hx    Asthma Neg Hx    Atopy Neg Hx    Eczema Neg Hx    Immunodeficiency Neg Hx    Urticaria Neg Hx     Social History Social History   Tobacco Use   Smoking status: Some Days    Packs/day: 0.30    Years: 45.00    Pack years: 13.50    Types: Cigarettes    Start date: 05/16/1971   Smokeless tobacco: Never   Tobacco comments:    Decreased Intake to 10 cigarettes per week or less.  Vaping Use   Vaping Use: Never used  Substance Use Topics   Alcohol use: No   Drug use: No    Review of Systems  Constitutional: No fever/chills Eyes: No visual changes. ENT: No sore throat. Cardiovascular: Denies chest pain. Respiratory: Denies shortness of breath. Gastrointestinal: No  abdominal pain.  No nausea, no vomiting.  No diarrhea.  No constipation. Genitourinary: Negative for dysuria. Musculoskeletal: Negative for back pain. Skin: Negative for rash. Neurological: Negative for headaches, focal weakness or numbness. Psychiatric: Hallucinations and HI.   10-point ROS otherwise negative.  ____________________________________________   PHYSICAL EXAM:  VITAL SIGNS: ED Triage Vitals [11/14/20 2024]  Enc Vitals Group     BP (!) 147/82     Pulse Rate (!) 105     Resp 18     Temp 98.9 F (37.2 C)     Temp Source Oral     SpO2 96 %   Constitutional:  Alert and oriented. Well appearing and in no acute distress. Eyes: Conjunctivae are normal.  Head: Atraumatic. Nose: No congestion/rhinnorhea. Mouth/Throat: Mucous membranes are moist.  Neck: No stridor.   Cardiovascular: Well perfused.   Respiratory: Normal respiratory effort.  Gastrointestinal: No distention.  Musculoskeletal: No gross deformities of extremities. Neurologic:  Normal speech and language. No gross focal neurologic deficits are appreciated.  Skin:  Skin is warm, dry and intact. No rash noted. Psych: Patient has a somewhat tangential thought process.  He is intermittently verbally aggressive but not physically aggressive.    ____________________________________________   LABS (all labs ordered are listed, but only abnormal results are displayed)  Labs Reviewed  COMPREHENSIVE METABOLIC PANEL - Abnormal; Notable for the following components:      Result Value   Chloride 113 (*)    CO2 19 (*)    Glucose, Bld 207 (*)    Creatinine, Ser 1.33 (*)    Albumin 3.3 (*)    AST 14 (*)    GFR, Estimated 60 (*)    All other components within normal limits  CBC WITH DIFFERENTIAL/PLATELET - Abnormal; Notable for the following components:   Hemoglobin 12.1 (*)    All other components within normal limits  RESP PANEL BY RT-PCR (FLU A&B, COVID) ARPGX2  ETHANOL  RAPID URINE DRUG SCREEN, HOSP PERFORMED   ____________________________________________   PROCEDURES  Procedure(s) performed:   Procedures  None  ____________________________________________   INITIAL IMPRESSION / ASSESSMENT AND PLAN / ED COURSE  Pertinent labs & imaging results that were available during my care of the patient were reviewed by me and considered in my medical decision making (see chart for details).   Patient presents to the emergency department with tangential thought process and report of hallucinations.  He is being verbally threatening with me at times but not physically imposing.  I was  able to de-escalate verbally.  We will have psychiatry evaluate.  Labs reviewed.  Patient evaluated by the psychiatry team.  He did verbalize wanting to hurt them as well.  Denies suicidal ideation.  Plan for overnight observation in AM re-evaluation.  ____________________________________________  FINAL CLINICAL IMPRESSION(S) / ED DIAGNOSES  Final diagnoses:  Encounter for psychological evaluation     MEDICATIONS GIVEN DURING THIS VISIT:  Medications  risperiDONE (RISPERDAL M-TABS) disintegrating tablet 2 mg (has no administration in time range)    And  ziprasidone (GEODON) injection 20 mg (has no administration in time range)  insulin aspart (novoLOG) injection 0-15 Units (has no administration in time range)  nicotine (NICODERM CQ - dosed in mg/24 hours) patch 21 mg (has no administration in time range)    Note:  This document was prepared using Dragon voice recognition software and may include unintentional dictation errors.  Alona Bene, MD, [facility] Emergency Medicine    Dinari Stgermaine, Arlyss Repress, MD 11/14/20 [phone]

## 2020-11-14 NOTE — ED Notes (Signed)
Pt moved to room 4 for TTS at this time.

## 2020-11-14 NOTE — BH Assessment (Signed)
Comprehensive Clinical Assessment (CCA) Note  11/15/2020 Dylan Jordan [id number]  Disposition: Dr. Jacqulyn Bath recommends pt to be observed and reassessed by psychiatry. Disposition discussed with Antony Madura, RN.   Roselyn Bering, NP recommended the pt to be psychically cleared, Dr. Jacqulyn Bath changed the pt's disposition.   The patient demonstrates the following risk factors for suicide: Chronic risk factors for suicide include: psychiatric disorder of Schizoaffective disorder, bipolar type (HCC) and demographic factors (male, >89 y/o). Acute risk factors for suicide include:  UTA . Protective factors for this patient include:  Pt denies, SI and self-injurious behaviors . Considering these factors, the overall suicide risk at this point appears to be "not filed". Patient is appropriate for outpatient follow up.  Dylan Jordan is a 65 year old male who presents voluntary and unaccompanied to [facility]. Pt was a poor historian during the assessment. Pt reports his name is "Dylan Jordan," his birth date is May 29, 1982 and is 65 years old. Clinician asked the pt, "what brought you to the hospital?" Pt reports, "auditory and visual hallucinations, "babies talk to me." Clinician asked additional questions pertaining to the pt's hallucinations ("what do the babies say, how often they talk to you, how old are the babies, where are you when the babies talk to you, etc.") however the pt didn't answer but wanted clinician to answer his questions. Clinician continued to re-focus the assessment on the pt however pt continued to ask why clinician wouldn't answer his questions. Pt mocked clinician voice and asked inappropriate questions for example, if she drank alcohol. Clinician asked the pt if the voices tell him to hurt himself or others, pt replied, "I'll like to hurt you because you won't talk to me, when you're right next door." Pt reported, he wants to hurt this clinician not kill her because she's (I'm) just  too nice. Pt reports, "I can hear nobody, somebody in the hallway." Pt denies, SI, self-injurious behaviors.   Pt denies, substance use. Pt's UDS is pending. Per chart pt is linked to [facility], unsure if the pt is prescribed medication and taking it as prescribed.   Pt was alert in scrubs on a hospital bed, pt answer very few questions during the assessment. Pt also laughed and responded to questions inappropriately during the assessment. At times the pt's speech was clear and other times when the pt rambled his speech was slurred and difficult to understand. Pt's mood was irritable. Pt's affect was labile. Pt's insight was lacking. Pt's judgement was poor. Clinician asked the pt if discharged he can contract for safety. Pt reports, he will find clinician. Pt also threatened Dr. Jacqulyn Bath.   Diagnosis: Schizoaffective disorder, bipolar type [facility])  *Clinician asked the many times if he has family supports and/or collaterals pt did not respond*   Chief Complaint: No chief complaint on file.  Visit Diagnosis:     CCA Screening, Triage and Referral (STR)  Patient Reported Information How did you hear about [place]? Self  What Is the Reason for Your Visit/Call Today? Per EDP note: "is a 65 y.o. male with PMH reviewed below including schizophrenia presents to the ED with hallucinations and HI. He was apparently walking around outside of the jail. He tell me that he wants "a pitcher of Diet Coke, cheeseburger, and sweet potato fries." He tells me that he is having hallucinations and that he is wanting to hurt people saying "I can hurt you. You think I can't hurt you?"  How Long Has  This Been Causing You Problems? 1 wk - 1 month  What Do You Feel Would Help You the Most Today? Treatment for Depression or other mood problem   Have You Recently Had Any Thoughts About Hurting Yourself? No  Are You Planning to Commit Suicide/Harm Yourself At This time? No   Have you Recently Had  Thoughts About Hurting Someone Karolee Ohslse? Yes (Pt reported, wanting to hurt Dr. Jacqulyn BathLong and clinician.)  Are You Planning to Harm Someone at This Time? No  Explanation: No data recorded  Have You Used Any Alcohol or Drugs in the Past 24 Hours? No  How Long Ago Did You Use Drugs or Alcohol? No data recorded What Did You Use and How Much? No data recorded  Do You Currently Have a Therapist/Psychiatrist? -- (Per chart pt is linked to [facility].)  Name of Therapist/Psychiatrist: No data recorded  Have You Been Recently Discharged From Any Office Practice or Programs? No  Explanation of Discharge From Practice/Program: No data recorded    CCA Screening Triage Referral Assessment Type of Contact: Tele-Assessment  Telemedicine Service Delivery: Telemedicine service delivery: This service was provided via telemedicine using a 2-way, interactive audio and video technology  Is this Initial or Reassessment? Initial Assessment  Date Telepsych consult ordered in [facility]:  11/14/20  [facility]  [facility]  [facility]  [facility]   Collateral Involvement: Pt respond when clinician asked about collateral involvement.   Does Patient Have a Automotive engineerCourt Appointed Legal Guardian? No data recorded Name and Contact of Legal Guardian: self  If Minor and Not Living with Parent(s), Who has Custody? n/a  Is CPS involved or ever been involved? Never  Is APS involved or ever been involved? Never   Patient Determined To Be At Risk for Harm To Self or Others Based on Review of Patient Reported Information or Presenting Complaint? Yes, for Harm to Others  Method: No Plan  Availability of Means: -- (UTA)  Intent: Vague intent or NA  Notification Required: Identifiable person is aware  Additional Information for Danger to Others Potential: Active psychosis  Additional Comments for Danger to Others Potential:  Pt reports, hearing and seeing things.  Are There Guns or Other Weapons in Your Home? -- (UTA)  Types of Guns/Weapons: No data recorded Are These Weapons Safely Secured?                            No data recorded Who Could Verify You Are Able To Have These Secured: No data recorded Do You Have any Outstanding Charges, Pending Court Dates, Parole/Probation? No data recorded Contacted To Inform of Risk of Harm To Self or Others: Other: Comment (Pt threatened Dr. Jacqulyn BathLong and clinician.)    Does Patient Present under Involuntary Commitment? No  IVC Papers Initial File Date: No data recorded  [place]   Patient Currently Receiving the Following Services: ACTT Psychologist, educational([facility]) (Per chart.)   Determination of Need: Urgent (48 hours)   Options For Referral: Other: Comment (Pt to be observed and reassessed by psychiatry.)     CCA Biopsychosocial Patient Reported Schizophrenia/Schizoaffective Diagnosis in Past: Yes   Strengths: UTA   Mental Health Symptoms Depression:   -- (UTA)   Duration of Depressive symptoms:    Mania:   -- (UTA)   Anxiety:    -- (UTA)   Psychosis:   Hallucinations  Duration of Psychotic symptoms:    Trauma:   -- (UTA)   Obsessions:   -- (UTA)   Compulsions:   -- (UTA)   Inattention:   -- (UTA)   Hyperactivity/Impulsivity:   -- (UTA)   Oppositional/Defiant Behaviors:   -- (UTA)   Emotional Irregularity:   N/A   Other Mood/Personality Symptoms:  No data recorded   Mental Status Exam Appearance and self-care  Stature:   Average   Weight:   Average weight   Clothing:   -- (Pt in scrubs.)   Grooming:   Normal   Cosmetic use:   None   Posture/gait:   -- (Pt sitting in hospital bed.)   Motor activity:   Agitated   Sensorium  Attention:   Inattentive   Concentration:   Focuses on irrelevancies   Orientation:   Object   Recall/memory:   Defective in Immediate   Affect  and Mood  Affect:   Labile (Irritable.)   Mood:   Irritable   Relating  Eye contact:   -- (Fair.)   Facial expression:   Tense   Attitude toward examiner:   Threatening   Thought and Language  Speech flow:  Slurred   Thought content:   Suspicious   Preoccupation:   Homicidal   Hallucinations:   Auditory; Visual   Organization:  No data recorded  [employer] of Knowledge:   Poor   Intelligence:   Average   Abstraction:   -- (UTA)   Judgement:   Poor   Reality Testing:   Distorted   Insight:   Lacking   Decision Making:   Impulsive   Social Functioning  Social Maturity:   -- Industrial/product designer)   Social Judgement:   Impropriety   Stress  Stressors:   Other (Comment) (UTA)   Coping Ability:   -- (UTA)   Skill Deficits:   Communication   Supports:   Other (Comment) (Pt not answer.)     Religion: Religion/Spirituality Are You A Religious Person?:  (UTA)  Leisure/Recreation: Leisure / Recreation Do You Have Hobbies?:  (UTA)  Exercise/Diet: Exercise/Diet Do You Exercise?:  (UTA) Do You Follow a Special Diet?:  (UTA) Do You Have Any Trouble Sleeping?:  (UTA)   CCA Employment/Education Employment/Work Situation: Employment / Work Situation Employment Situation:  (UTA) Has Patient ever Been in Equities trader?:  Industrial/product designer)  Education: Education Is Patient Currently Attending School?:  (UTA) Last Grade Completed:  (UTA) Did You Attend College?:  (UTA)   CCA Family/Childhood History Family and Relationship History: Family history Marital status: Single (Per chart.) Does patient have children?: No (Per chart.)  Childhood History:  Childhood History By whom was/is the patient raised?:  (UTA) Did patient suffer any verbal/emotional/physical/sexual abuse as a child?:  (UTA) Did patient suffer from severe childhood neglect?:  (UTA) Has patient ever been sexually abused/assaulted/raped as an adolescent or adult?:  (UTA) Was the  patient ever a victim of a crime or a disaster?:  (UTA) Witnessed domestic violence?:  (UTA)  Child/Adolescent Assessment:     CCA Substance Use Alcohol/Drug Use: Alcohol / Drug Use Pain Medications: See MAR Prescriptions: See MAR Over the Counter: See MAR History of alcohol / drug use?: No history of alcohol / drug abuse (Pt denies.)    ASAM's:  Six Dimensions of Multidimensional Assessment  Dimension 1:  Acute Intoxication and/or Withdrawal Potential:      Dimension 2:  Biomedical Conditions and Complications:      Dimension 3:  Emotional, Behavioral, or Cognitive Conditions and Complications:     Dimension 4:  Readiness to Change:     Dimension 5:  Relapse, Continued use, or Continued Problem Potential:     Dimension 6:  Recovery/Living Environment:     ASAM Severity Score:    ASAM Recommended Level of Treatment:     Substance use Disorder (SUD)    Recommendations for Services/Supports/Treatments: Recommendations for Services/Supports/Treatments Recommendations For Services/Supports/Treatments: Other (Comment) (Pt to be observed and reassessed by psychiatry.)  Discharge Disposition:    DSM5 Diagnoses: Patient Active Problem List   Diagnosis Date Noted   Homelessness 09/05/2020   Stage 3a chronic kidney disease (HCC) 04/27/2020   Slow transit constipation 04/27/2020   Noncompliance with diabetes treatment 04/20/2020   Uncontrolled type 2 diabetes mellitus with hyperglycemia (HCC) 04/20/2020   Hyperkalemia, diminished renal excretion 03/29/2020   Hyperkalemia    Schizoaffective disorder, bipolar type (HCC) 02/21/2019   Hallucinations    Anxiety 09/18/2018   Swelling of both lower extremities 09/13/2018   Type 2 diabetes mellitus without complication, with long-term current use of insulin (HCC)    Anemia    Hyperlipidemia 12/18/2017   Mixed stress and urge urinary incontinence 11/25/2017   Intermittent chest pain 11/25/2017   Allergic rhinitis with a  nonallergic component 07/30/2017   Food allergy 07/30/2017   Chronic gout involving toe of left foot without tophus 05/18/2017   Hemorrhoids 05/04/2017   Chronic low back pain 01/03/2017   Chronic cough 06/30/2016   Drug-induced mood disorder (HCC) 06/14/2016   Type 2 diabetes mellitus with diabetic neuropathy (HCC) 05/26/2016   Iron deficiency anemia 05/26/2016   Chronic kidney disease 05/26/2016   HTN (hypertension) 05/26/2016   Tobacco abuse 05/26/2016   History of substance abuse (HCC) 05/26/2016   History of alcohol abuse 05/26/2016   Mild persistent asthma 05/26/2016     Referrals to Alternative Service(s): Referred to Alternative Service(s):   Place:   Date:   Time:    Referred to Alternative Service(s):   Place:   Date:   Time:    Referred to Alternative Service(s):   Place:   Date:   Time:    Referred to Alternative Service(s):   Place:   Date:   Time:     Redmond Pulling, [facility] Comprehensive Clinical Assessment (CCA) Screening, Triage and Referral Note  11/15/2020 Dylan Jordan [id number]  Chief Complaint: No chief complaint on file.  Visit Diagnosis:   Patient Reported Information How did you hear about [place]? Self  What Is the Reason for Your Visit/Call Today? Per EDP note: "is a 65 y.o. male with PMH reviewed below including schizophrenia presents to the ED with hallucinations and HI. He was apparently walking around outside of the jail. He tell me that he wants "a pitcher of Diet Coke, cheeseburger, and sweet potato fries." He tells me that he is having hallucinations and that he is wanting to hurt people saying "I can hurt you. You think I can't hurt you?"  How Long Has This Been Causing You Problems? 1 wk - 1 month  What Do You Feel Would Help You the Most Today? Treatment for Depression or other mood problem   Have You Recently Had Any Thoughts About Hurting Yourself? No  Are You Planning to Commit Suicide/Harm Yourself At This time? No   Have you  Recently Had Thoughts About Hurting Someone [name]? Yes (Pt reported, wanting to hurt Dr. Jacqulyn Bath and clinician.)  Are You Planning to Harm Someone at  This Time? No  Explanation: No data recorded  Have You Used Any Alcohol or Drugs in the Past 24 Hours? No  How Long Ago Did You Use Drugs or Alcohol? No data recorded What Did You Use and How Much? No data recorded  Do You Currently Have a Therapist/Psychiatrist? -- (Per chart pt is linked to [facility].)  Name of Therapist/Psychiatrist: No data recorded  Have You Been Recently Discharged From Any Office Practice or Programs? No  Explanation of Discharge From Practice/Program: No data recorded   CCA Screening Triage Referral Assessment Type of Contact: Tele-Assessment  Telemedicine Service Delivery: Telemedicine service delivery: This service was provided via telemedicine using a 2-way, interactive audio and video technology  Is this Initial or Reassessment? Initial Assessment  Date Telepsych consult ordered in [facility]:  11/14/20  Time Telepsych consult ordered in [facility]:  2156  Location of Assessment: [facility] ED  Provider Location: [facility] Assessment Services   Collateral Involvement: Pt respond when clinician asked about collateral involvement.   Does Patient Have a Automotive engineer Guardian? No data recorded Name and Contact of Legal Guardian: self  If Minor and Not Living with Parent(s), Who has Custody? n/a  Is CPS involved or ever been involved? Never  Is APS involved or ever been involved? Never   Patient Determined To Be At Risk for Harm To Self or Others Based on Review of Patient Reported Information or Presenting Complaint? Yes, for Harm to Others  Method: No Plan  Availability of Means: -- (UTA)  Intent: Vague intent or NA  Notification Required: Identifiable person is aware  Additional Information for Danger to Others Potential: Active psychosis  Additional Comments for Danger to Others  Potential: Pt reports, hearing and seeing things.  Are There Guns or Other Weapons in Your Home? -- (UTA)  Types of Guns/Weapons: No data recorded Are These Weapons Safely Secured?                            No data recorded Who Could Verify You Are Able To Have These Secured: No data recorded Do You Have any Outstanding Charges, Pending Court Dates, Parole/Probation? No data recorded Contacted To Inform of Risk of Harm To Self or Others: Other: Comment (Pt threatened Dr. Jacqulyn Bath and clinician.)   Does Patient Present under Involuntary Commitment? No  IVC Papers Initial File Date: No data recorded  [place] of Residence: [place]   Patient Currently Receiving the Following Services: ACTT Psychologist, educational) (Per chart.)   Determination of Need: Urgent (48 hours)   Options For Referral: Other: Comment (Pt to be observed and reassessed by psychiatry.)   Discharge Disposition:     Redmond Pulling, [facility]       Redmond Pulling, MS, [facility], [facility] Triage Specialist [phone]

## 2020-11-15 ENCOUNTER — Encounter (HOSPITAL_COMMUNITY): Payer: Self-pay | Admitting: Emergency Medicine

## 2020-11-15 ENCOUNTER — Emergency Department (HOSPITAL_COMMUNITY)
Admission: EM | Admit: 2020-11-15 | Discharge: 2020-11-16 | Disposition: A | Discharge status: Home / Self Care | Payer: Medicaid Other | Attending: Emergency Medicine | Admitting: Emergency Medicine

## 2020-11-15 ENCOUNTER — Emergency Department (HOSPITAL_COMMUNITY)
Admission: EM | Admit: 2020-11-15 | Discharge: 2020-11-15 | Disposition: A | Discharge status: Home / Self Care | Payer: Medicaid Other | Source: Home / Self Care | Attending: Emergency Medicine | Admitting: Emergency Medicine

## 2020-11-15 ENCOUNTER — Other Ambulatory Visit: Payer: Self-pay

## 2020-11-15 ENCOUNTER — Encounter (HOSPITAL_COMMUNITY): Payer: Self-pay | Admitting: Registered Nurse

## 2020-11-15 DIAGNOSIS — E785 Hyperlipidemia, unspecified: Secondary | ICD-10-CM | POA: Diagnosis not present

## 2020-11-15 DIAGNOSIS — N183 Chronic kidney disease, stage 3 unspecified: Secondary | ICD-10-CM | POA: Insufficient documentation

## 2020-11-15 DIAGNOSIS — Z765 Malingerer [conscious simulation]: Secondary | ICD-10-CM | POA: Diagnosis present

## 2020-11-15 DIAGNOSIS — E1165 Type 2 diabetes mellitus with hyperglycemia: Secondary | ICD-10-CM | POA: Insufficient documentation

## 2020-11-15 DIAGNOSIS — E1122 Type 2 diabetes mellitus with diabetic chronic kidney disease: Secondary | ICD-10-CM | POA: Insufficient documentation

## 2020-11-15 DIAGNOSIS — Z794 Long term (current) use of insulin: Secondary | ICD-10-CM | POA: Diagnosis not present

## 2020-11-15 DIAGNOSIS — R440 Auditory hallucinations: Secondary | ICD-10-CM | POA: Insufficient documentation

## 2020-11-15 DIAGNOSIS — Z96652 Presence of left artificial knee joint: Secondary | ICD-10-CM | POA: Insufficient documentation

## 2020-11-15 DIAGNOSIS — E1169 Type 2 diabetes mellitus with other specified complication: Secondary | ICD-10-CM | POA: Insufficient documentation

## 2020-11-15 DIAGNOSIS — Z79899 Other long term (current) drug therapy: Secondary | ICD-10-CM | POA: Insufficient documentation

## 2020-11-15 DIAGNOSIS — I129 Hypertensive chronic kidney disease with stage 1 through stage 4 chronic kidney disease, or unspecified chronic kidney disease: Secondary | ICD-10-CM | POA: Diagnosis not present

## 2020-11-15 DIAGNOSIS — Z5900 Homelessness unspecified: Secondary | ICD-10-CM | POA: Insufficient documentation

## 2020-11-15 DIAGNOSIS — R441 Visual hallucinations: Secondary | ICD-10-CM | POA: Diagnosis not present

## 2020-11-15 DIAGNOSIS — F250 Schizoaffective disorder, bipolar type: Secondary | ICD-10-CM

## 2020-11-15 DIAGNOSIS — Z7984 Long term (current) use of oral hypoglycemic drugs: Secondary | ICD-10-CM | POA: Diagnosis not present

## 2020-11-15 DIAGNOSIS — F17210 Nicotine dependence, cigarettes, uncomplicated: Secondary | ICD-10-CM | POA: Insufficient documentation

## 2020-11-15 DIAGNOSIS — J45909 Unspecified asthma, uncomplicated: Secondary | ICD-10-CM | POA: Insufficient documentation

## 2020-11-15 DIAGNOSIS — N1831 Chronic kidney disease, stage 3a: Secondary | ICD-10-CM | POA: Insufficient documentation

## 2020-11-15 DIAGNOSIS — R739 Hyperglycemia, unspecified: Secondary | ICD-10-CM

## 2020-11-15 DIAGNOSIS — E1140 Type 2 diabetes mellitus with diabetic neuropathy, unspecified: Secondary | ICD-10-CM | POA: Insufficient documentation

## 2020-11-15 LAB — I-STAT CHEM 8, ED
BUN: 17 mg/dL (ref 8–23)
Calcium, Ion: 1.3 mmol/L (ref 1.15–1.40)
Chloride: 108 mmol/L (ref 98–111)
Creatinine, Ser: 1.1 mg/dL (ref 0.61–1.24)
Glucose, Bld: 236 mg/dL — ABNORMAL HIGH (ref 70–99)
HCT: 38 % — ABNORMAL LOW (ref 39.0–52.0)
Hemoglobin: 12.9 g/dL — ABNORMAL LOW (ref 13.0–17.0)
Potassium: 4.1 mmol/L (ref 3.5–5.1)
Sodium: 140 mmol/L (ref 135–145)
TCO2: 21 mmol/L — ABNORMAL LOW (ref 22–32)

## 2020-11-15 LAB — RAPID URINE DRUG SCREEN, HOSP PERFORMED
Amphetamines: NOT DETECTED
Barbiturates: NOT DETECTED
Benzodiazepines: NOT DETECTED
Cocaine: NOT DETECTED
Opiates: NOT DETECTED
Tetrahydrocannabinol: NOT DETECTED

## 2020-11-15 LAB — CBG MONITORING, ED: Glucose-Capillary: 193 mg/dL — ABNORMAL HIGH (ref 70–99)

## 2020-11-15 NOTE — ED Provider Notes (Addendum)
[facility]-EMERGENCY DEPT Provider Note   CSN: [id number] Arrival date & time: 11/15/20  2243     History Chief Complaint  Patient presents with   Hallucinations    Dylan Jordan is a 65 y.o. male.  Comes to the ER stating that he has having auditory and visual hallucinations.  This is his third visit to the emergency department today with similar concerns.  He was cleared by psychiatry earlier today, as they did not feel he was a danger to himself or others.  His psychosis is at his baseline.  Patient is homeless.      Past Medical History:  Diagnosis Date   Anemia    Asthma    Diabetes mellitus without complication (HCC)    Gout    Hyperlipidemia    Hyperosmolar hyperglycemic coma due to diabetes mellitus without ketoacidosis (HCC)    Hypertension    Schizophrenia (HCC) 07/06/2018    Patient Active Problem List   Diagnosis Date Noted   Malingering 11/15/2020   Homelessness 09/05/2020   Stage 3a chronic kidney disease (HCC) 04/27/2020   Slow transit constipation 04/27/2020   Noncompliance with diabetes treatment 04/20/2020   Uncontrolled type 2 diabetes mellitus with hyperglycemia (HCC) 04/20/2020   Hyperkalemia, diminished renal excretion 03/29/2020   Hyperkalemia    Schizoaffective disorder, bipolar type (HCC) 02/21/2019   Hallucinations    Anxiety 09/18/2018   Swelling of both lower extremities 09/13/2018   Type 2 diabetes mellitus without complication, with long-term current use of insulin (HCC)    Anemia    Hyperlipidemia 12/18/2017   Mixed stress and urge urinary incontinence 11/25/2017   Intermittent chest pain 11/25/2017   Allergic rhinitis with a nonallergic component 07/30/2017   Food allergy 07/30/2017   Chronic gout involving toe of left foot without tophus 05/18/2017   Hemorrhoids 05/04/2017   Chronic low back pain 01/03/2017   Chronic cough 06/30/2016   Drug-induced mood disorder (HCC) 06/14/2016   Type 2 diabetes mellitus  with diabetic neuropathy (HCC) 05/26/2016   Iron deficiency anemia 05/26/2016   Chronic kidney disease 05/26/2016   HTN (hypertension) 05/26/2016   Tobacco abuse 05/26/2016   History of substance abuse (HCC) 05/26/2016   History of alcohol abuse 05/26/2016   Mild persistent asthma 05/26/2016    Past Surgical History:  Procedure Laterality Date   REPLACEMENT TOTAL KNEE Left    TOE SURGERY         Family History  Adopted: Yes  Problem Relation Age of Onset   Allergic rhinitis Neg Hx    Angioedema Neg Hx    Asthma Neg Hx    Atopy Neg Hx    Eczema Neg Hx    Immunodeficiency Neg Hx    Urticaria Neg Hx     Social History   Tobacco Use   Smoking status: Some Days    Packs/day: 0.30    Years: 45.00    Pack years: 13.50    Types: Cigarettes    Start date: 05/16/1971   Smokeless tobacco: Never   Tobacco comments:    Decreased Intake to 10 cigarettes per week or less.  Vaping Use   Vaping Use: Never used  Substance Use Topics   Alcohol use: No   Drug use: No    Home Medications Prior to Admission medications   Medication Sig Start Date End Date Taking? Authorizing Provider  albuterol (VENTOLIN HFA) 108 (90 Base) MCG/ACT inhaler Inhale 1-2 puffs into the lungs every 4 (four) hours as needed  for wheezing or shortness of breath. 10/29/20   Willy Eddy, MD  amLODipine (NORVASC) 10 MG tablet Take 1 tablet (10 mg total) by mouth daily. 10/29/20   Willy Eddy, MD  atorvastatin (LIPITOR) 20 MG tablet Take 1 tablet (20 mg total) by mouth at bedtime. 10/29/20 12/28/20  Willy Eddy, MD  benztropine (COGENTIN) 1 MG tablet Take 1 tablet (1 mg total) by mouth 2 (two) times daily. 10/29/20   Willy Eddy, MD  desmopressin (DDAVP) 0.2 MG tablet Take 1 tablet (0.2 mg total) by mouth at bedtime. 10/29/20   Willy Eddy, MD  ferrous sulfate 325 (65 FE) MG tablet Take 1 tablet (325 mg total) by mouth every other day. 10/29/20   Willy Eddy, MD  gabapentin  (NEURONTIN) 300 MG capsule Take 2 capsules (600 mg total) by mouth 3 (three) times daily. 10/29/20 11/28/20  Willy Eddy, MD  insulin aspart (NOVOLOG) 100 UNIT/ML injection Inject 9 Units into the skin 3 (three) times daily with meals. 10/29/20   Willy Eddy, MD  insulin glargine (LANTUS) 100 UNIT/ML injection Inject 0.4 mLs (40 Units total) into the skin daily. 10/29/20   Willy Eddy, MD  lisinopril (ZESTRIL) 10 MG tablet Take 1 tablet (10 mg total) by mouth daily. 10/29/20 11/28/20  Willy Eddy, MD  Melatonin 10 MG TABS Take 10 mg by mouth at bedtime. 10/29/20   Willy Eddy, MD  metformin (FORTAMET) 1000 MG (OSM) 24 hr tablet Take 1 tablet (1,000 mg total) by mouth daily with breakfast. 10/29/20   Willy Eddy, MD  nicotine (NICODERM CQ - DOSED IN MG/24 HOURS) 21 mg/24hr patch Place 1 patch (21 mg total) onto the skin daily. 10/29/20   Willy Eddy, MD  OLANZapine (ZYPREXA) 10 MG tablet Take 1 tablet (10 mg total) by mouth in the morning and at bedtime. 10/29/20 11/28/20  Willy Eddy, MD  paliperidone (INVEGA) 9 MG 24 hr tablet Take 1 tablet (9 mg total) by mouth daily. 10/29/20   Willy Eddy, MD  pantoprazole (PROTONIX) 40 MG tablet Take 1 tablet (40 mg total) by mouth daily. 10/29/20   Willy Eddy, MD    Allergies    Chantix [varenicline tartrate], Ativan [lorazepam], Other, Pineapple, and Penicillins  Review of Systems   Review of Systems  Psychiatric/Behavioral:  Positive for hallucinations.   All other systems reviewed and are negative.  Physical Exam Updated Vital Signs BP 126/69   Pulse 96   Temp 98.4 F (36.9 C) (Oral)   Resp 18   SpO2 96%   Physical Exam  ED Results / Procedures / Treatments   Labs (all labs ordered are listed, but only abnormal results are displayed) Labs Reviewed - No data to display  EKG None  Radiology No results found.  Procedures Procedures   Medications Ordered in ED Medications - No data to  display  ED Course  I have reviewed the triage vital signs and the nursing notes.  Pertinent labs & imaging results that were available during my care of the patient were reviewed by me and considered in my medical decision making (see chart for details).    MDM Rules/Calculators/A&P                          Patient with chronic psychiatric condition secondary to schizophrenia presents to the emergency department stating that he is having hallucinations.  He apparently told nursing staff earlier he was homicidal and earlier in the day at a previous visit stated  that he was suicidal.  Currently he will not answer any questions for me.  He was cleared earlier today by psychiatry.  At this time it is felt he is malingering secondary to his homelessness.  Final Clinical Impression(s) / ED Diagnoses Final diagnoses:  Malingering    Rx / DC Orders ED Discharge Orders     None        Destyne Goodreau, Canary Brim, MD 11/15/20 2324    Gilda Crease, MD 12/09/20 [id number]    Gilda Crease, MD 12/09/20 [phone]

## 2020-11-15 NOTE — Progress Notes (Signed)
[name] contacted patients ACT Team at [facility] [phone]). [name] was told that patient is homeless and has no where to go. Patient does not have a legal guardian. [name] was told that someone would contact [name] back. [name] will inform provider and give patient homeless resources.

## 2020-11-15 NOTE — Discharge Instructions (Addendum)
Contact your ACT team (our social work team has provided that info to you).

## 2020-11-15 NOTE — ED Notes (Signed)
Pt's belongings placed in locker #1 

## 2020-11-15 NOTE — ED Notes (Signed)
Patient was given A Diet Coke and [employer].

## 2020-11-15 NOTE — Discharge Instructions (Addendum)
Take your medicines as directed.  Go to the [facility] if needed for medical condition problems.  Go to shelter of your choice if needed for housing.  Go to the behavioral health urgent care if needed for problems with your psychiatric illness.

## 2020-11-15 NOTE — ED Notes (Addendum)
Patient was given 2 CUPS OF WATER.

## 2020-11-15 NOTE — ED Provider Notes (Signed)
Emergency Medicine Observation Re-evaluation Note  Dylan Jordan is a 65 y.o. male, seen on rounds today.  Pt initially presented to the ED for complaints of No chief complaint on file. Currently, the patient is calm. No complaints.  Physical Exam  BP (!) 141/70 (BP Location: Right Arm)   Pulse 73   Temp 98.8 F (37.1 C) (Oral)   Resp 18   Ht 6\' 4"  (1.93 m)   Wt 103.2 kg   SpO2 96%   BMI 27.69 kg/m  Physical Exam General: no distress Cardiac: regular rate Lungs: no resp distress Psych: alert, cooperative  ED Course / MDM  EKG:   I have reviewed the labs performed to date as well as medications administered while in observation.  Recent changes in the last 24 hours include - no new changes.  Plan  Current plan is for to be discharged. Psych team have rounded and cleared him. Patient is not under full IVC at this time.   , MD 11/15/20 [phone]

## 2020-11-15 NOTE — ED Notes (Signed)
RN attempted to give pt insulin. Pt refusing and telling the RN that she is not diabetic and therefore does not understand diabetic medications.

## 2020-11-15 NOTE — ED Provider Notes (Addendum)
[facility] EMERGENCY DEPARTMENT Provider Note   CSN: [id number] Arrival date & time: 11/15/20  1437     History Chief Complaint  Patient presents with   Dizziness   Near Syncope    Haniel Fix is a 65 y.o. male.  HPI Verbal discussion with patient at 4:58 PM.  At this time he states "I need a social consult."  He states he does not have a place to stay.  He acknowledges leaving here several hours ago after being evaluated for hallucinations and homicidal ideation.  He was discharged from this ED today at 12:19 PM.  He returned to triage, by EMS at 1437.  Apparently he admitted about half a mile down the road, where he stopped at the fire station, reported needing help, and was transferred here by EMS.  EMS felt that he was lethargic and noticed that he was sweating.  His blood pressure was 128/72, supine and went to 93/60 standing.  Apparently at that time he had a heart rate of 120.  On arrival here his vital signs normalized.  An IV was started by EMS.  Is not clear if he was given intravenous fluids.  Patient was evaluated earlier today by TTS, and was found to have a consistent, psychiatric disorder, without concerns for harm to self or others.  He was deemed stable for discharge to follow-up with outpatient ACT team.  Social work saw him and gave him resources.  We also contacted his ACT team in the community who has previously helped him.  They stated that they would not help him today because of his ongoing refusal to follow through with treatment plans.  He is reported to have "burned multiple bridges."  Patient does not report any other acute, or ongoing medical problems.    Past Medical History:  Diagnosis Date   Anemia    Asthma    Diabetes mellitus without complication (HCC)    Gout    Hyperlipidemia    Hyperosmolar hyperglycemic coma due to diabetes mellitus without ketoacidosis (HCC)    Hypertension    Schizophrenia (HCC) 07/06/2018    Patient  Active Problem List   Diagnosis Date Noted   Malingering 11/15/2020   Homelessness 09/05/2020   Stage 3a chronic kidney disease (HCC) 04/27/2020   Slow transit constipation 04/27/2020   Noncompliance with diabetes treatment 04/20/2020   Uncontrolled type 2 diabetes mellitus with hyperglycemia (HCC) 04/20/2020   Hyperkalemia, diminished renal excretion 03/29/2020   Hyperkalemia    Schizoaffective disorder, bipolar type (HCC) 02/21/2019   Hallucinations    Anxiety 09/18/2018   Swelling of both lower extremities 09/13/2018   Type 2 diabetes mellitus without complication, with long-term current use of insulin (HCC)    Anemia    Hyperlipidemia 12/18/2017   Mixed stress and urge urinary incontinence 11/25/2017   Intermittent chest pain 11/25/2017   Allergic rhinitis with a nonallergic component 07/30/2017   Food allergy 07/30/2017   Chronic gout involving toe of left foot without tophus 05/18/2017   Hemorrhoids 05/04/2017   Chronic low back pain 01/03/2017   Chronic cough 06/30/2016   Drug-induced mood disorder (HCC) 06/14/2016   Type 2 diabetes mellitus with diabetic neuropathy (HCC) 05/26/2016   Iron deficiency anemia 05/26/2016   Chronic kidney disease 05/26/2016   HTN (hypertension) 05/26/2016   Tobacco abuse 05/26/2016   History of substance abuse (HCC) 05/26/2016   History of alcohol abuse 05/26/2016   Mild persistent asthma 05/26/2016    Past Surgical History:  Procedure Laterality Date   REPLACEMENT TOTAL KNEE Left    TOE SURGERY         Family History  Adopted: Yes  Problem Relation Age of Onset   Allergic rhinitis Neg Hx    Angioedema Neg Hx    Asthma Neg Hx    Atopy Neg Hx    Eczema Neg Hx    Immunodeficiency Neg Hx    Urticaria Neg Hx     Social History   Tobacco Use   Smoking status: Some Days    Packs/day: 0.30    Years: 45.00    Pack years: 13.50    Types: Cigarettes    Start date: 05/16/1971   Smokeless tobacco: Never   Tobacco comments:     Decreased Intake to 10 cigarettes per week or less.  Vaping Use   Vaping Use: Never used  Substance Use Topics   Alcohol use: No   Drug use: No    Home Medications Prior to Admission medications   Medication Sig Start Date End Date Taking? Authorizing Provider  albuterol (VENTOLIN HFA) 108 (90 Base) MCG/ACT inhaler Inhale 1-2 puffs into the lungs every 4 (four) hours as needed for wheezing or shortness of breath. 10/29/20   Willy Eddy, MD  amLODipine (NORVASC) 10 MG tablet Take 1 tablet (10 mg total) by mouth daily. 10/29/20   Willy Eddy, MD  atorvastatin (LIPITOR) 20 MG tablet Take 1 tablet (20 mg total) by mouth at bedtime. 10/29/20 12/28/20  Willy Eddy, MD  benztropine (COGENTIN) 1 MG tablet Take 1 tablet (1 mg total) by mouth 2 (two) times daily. 10/29/20   Willy Eddy, MD  desmopressin (DDAVP) 0.2 MG tablet Take 1 tablet (0.2 mg total) by mouth at bedtime. 10/29/20   Willy Eddy, MD  ferrous sulfate 325 (65 FE) MG tablet Take 1 tablet (325 mg total) by mouth every other day. 10/29/20   Willy Eddy, MD  gabapentin (NEURONTIN) 300 MG capsule Take 2 capsules (600 mg total) by mouth 3 (three) times daily. 10/29/20 11/28/20  Willy Eddy, MD  insulin aspart (NOVOLOG) 100 UNIT/ML injection Inject 9 Units into the skin 3 (three) times daily with meals. 10/29/20   Willy Eddy, MD  insulin glargine (LANTUS) 100 UNIT/ML injection Inject 0.4 mLs (40 Units total) into the skin daily. 10/29/20   Willy Eddy, MD  lisinopril (ZESTRIL) 10 MG tablet Take 1 tablet (10 mg total) by mouth daily. 10/29/20 11/28/20  Willy Eddy, MD  Melatonin 10 MG TABS Take 10 mg by mouth at bedtime. 10/29/20   Willy Eddy, MD  metformin (FORTAMET) 1000 MG (OSM) 24 hr tablet Take 1 tablet (1,000 mg total) by mouth daily with breakfast. 10/29/20   Willy Eddy, MD  nicotine (NICODERM CQ - DOSED IN MG/24 HOURS) 21 mg/24hr patch Place 1 patch (21 mg total) onto the skin  daily. 10/29/20   Willy Eddy, MD  OLANZapine (ZYPREXA) 10 MG tablet Take 1 tablet (10 mg total) by mouth in the morning and at bedtime. 10/29/20 11/28/20  Willy Eddy, MD  paliperidone (INVEGA) 9 MG 24 hr tablet Take 1 tablet (9 mg total) by mouth daily. 10/29/20   Willy Eddy, MD  pantoprazole (PROTONIX) 40 MG tablet Take 1 tablet (40 mg total) by mouth daily. 10/29/20   Willy Eddy, MD    Allergies    Chantix [varenicline tartrate], Ativan [lorazepam], Other, Pineapple, and Penicillins  Review of Systems   Review of Systems  All other systems reviewed and are negative.  Physical Exam Updated Vital Signs BP (!) 130/91   Pulse 82   Temp 98.6 F (37 C) (Oral)   Resp 20   Ht 6\' 4"  (1.93 m)   Wt 103 kg   SpO2 100%   BMI 27.64 kg/m   Physical Exam Vitals and nursing note reviewed.  Constitutional:      Appearance: He is well-developed. He is not ill-appearing.  HENT:     Head: Normocephalic and atraumatic.     Right Ear: External ear normal.     Left Ear: External ear normal.  Eyes:     Conjunctiva/sclera: Conjunctivae normal.     Pupils: Pupils are equal, round, and reactive to light.  Neck:     Trachea: Phonation normal.  Cardiovascular:     Rate and Rhythm: Normal rate.  Pulmonary:     Effort: Pulmonary effort is normal.  Abdominal:     General: There is no distension.  Musculoskeletal:        General: Normal range of motion.     Cervical back: Normal range of motion and neck supple.  Skin:    General: Skin is warm and dry.  Neurological:     Mental Status: He is alert and oriented to person, place, and time.     Cranial Nerves: No cranial nerve deficit.     Sensory: No sensory deficit.     Motor: No abnormal muscle tone.     Coordination: Coordination normal.  Psychiatric:        Mood and Affect: Mood normal.        Behavior: Behavior normal.        Thought Content: Thought content normal.        Judgment: Judgment normal.     Comments:  He does not appear to responding to internal stimuli.  He is respectful and cooperative.    ED Results / Procedures / Treatments   Labs (all labs ordered are listed, but only abnormal results are displayed) Labs Reviewed  I-STAT CHEM 8, ED - Abnormal; Notable for the following components:      Result Value   Glucose, Bld 236 (*)    TCO2 21 (*)    Hemoglobin 12.9 (*)    HCT 38.0 (*)    All other components within normal limits    EKG EKG Interpretation  Date/Time:  Monday November 15 2020 14:39:51 EDT Ventricular Rate:  97 PR Interval:  161 QRS Duration: 89 QT Interval:  359 QTC Calculation: 442 R Axis:   -15 Text Interpretation: Sinus rhythm Atrial premature complexes Probable left atrial enlargement Borderline left axis deviation Low voltage, precordial leads since last tracing no significant change Confirmed by [id number] [phone]) on 11/15/2020 5:59:47 PM  Radiology No results found.  Procedures Procedures   Medications Ordered in ED Medications - No data to display  ED Course  I have reviewed the triage vital signs and the nursing notes.  Pertinent labs & imaging results that were available during my care of the patient were reviewed by me and considered in my medical decision making (see chart for details).    MDM Rules/Calculators/A&P                           Patient Vitals for the past 24 hrs:  BP Temp Temp src Pulse Resp SpO2 Height Weight  11/15/20 1749 (!) 130/91 -- -- 82 20 100 % -- --  11/15/20 1615 (!) 146/89 -- --  85 18 100 % -- --  11/15/20 1442 -- -- -- -- -- -- 6\' 4"  (1.93 m) 103 kg  11/15/20 1441 (!) 113/91 98.6 F (37 C) Oral 90 19 99 % -- --  11/15/20 1438 -- -- -- -- -- 98 % -- --    6:19 PM Reevaluation with update and discussion. After initial assessment and treatment, an updated evaluation reveals patient is alert and communicative.  He states he "lost the paper he was given earlier."  He states he has his medications.  Findings were  discussed and questions answered. Mancel BaleElliott Laysa Kimmey   Medical Decision Making:  This patient is presenting for evaluation of needing a place to stay, which does not require a range of treatment options, and is not a complaint that involves a high risk of morbidity and mortality. The differential diagnoses include homelessness, chronic psychiatric illness. I decided to review old records, and in summary elderly male, with stable schizophrenia, who has had numerous ED evaluations, and psychiatric healthcare encounters including with his outpatient ACT team.  He has had over a dozen encounters in the month of June, 2022. I did not require additional historical information from anyone.  Clinical Laboratory Tests Ordered, included  i-STAT 8 . Review indicates normal except glucose high.   Critical Interventions-clinical evaluation, laboratory testing, observation and reassessment  After These Interventions, the Patient was reevaluated and was found able for discharge.  Patient with recurrent visits to the ED, referral to urgent care, [facility] [place].  He is homeless.  He has access to psychiatric care in the community.  CRITICAL CARE-no Performed by: Mancel BaleElliott Nataliyah Packham  Nursing Notes Reviewed/ Care Coordinated Applicable Imaging Reviewed Interpretation of Laboratory Data incorporated into ED treatment  The patient appears reasonably screened and/or stabilized for discharge and I doubt any other medical condition or other [facility] requiring further screening, evaluation, or treatment in the ED at this time prior to discharge.  Plan: Home Medications-continue usual; Home Treatments-low-carb diet; return here if the recommended treatment, does not improve the symptoms; Recommended follow up-psychiatric care providers as needed     Final Clinical Impression(s) / ED Diagnoses Final diagnoses:  Homelessness  Hyperglycemia    Rx / DC Orders ED Discharge Orders      None        Mancel BaleWentz, Yogesh Cominsky, MD 11/15/20 Kristeen [id number]    Mancel BaleWentz, Chanan Detwiler, MD 11/15/20 1824

## 2020-11-15 NOTE — Progress Notes (Signed)
[name] spoke with patient and provided him with information for his ACT Team and shelter resources. Patient stated he has no ACT Team then pointed to the TV and stated they are acting. Patient also referred to himself by a different name. When [name] tried to explain shelter resources patient stated he is not going anywhere. [name] notified RN that security will need to be called to escort patient out of hospital.

## 2020-11-15 NOTE — Progress Notes (Signed)
[name] spoke with [name] from patients ACT Team. [name] was told that patient has burned his bridges with multiple resources that have been provided to him. [name] stated he was directed by his boss not to pick-up patient anymore. [name] asked if patient had a legal guardian and [name] was told no. [name] stated patient is free to go.

## 2020-11-15 NOTE — ED Triage Notes (Signed)
Pt [facility] stating that he is having auditory/visual hallucinations. Pt has hx of schizophrenia and was seen at [facility] early today for same. Pt does endorse SI, but does not have a plan. Pt is homeless. CBG was 381. Pt is calm and cooperative at this time

## 2020-11-15 NOTE — ED Triage Notes (Signed)
Pt arrives via [facility] from fire dept. Pt was discharge earlier this morning, was wandering on street, went to fire dept stating he felt dizzy like he was going to pass out. Upon Ems arrival pt was lethargic and diaphoretic, sitting BP 128/72, standing 93/60, HR 120. 20g R hand

## 2020-11-15 NOTE — Consult Note (Signed)
Telepsych Consultation   Reason for Consult:  Psychosis Referring Physician:  Maia Plan, MD Location of Patient: [facility] ED Location of Provider: Other: [facility]  Patient Identification: Dylan Jordan MRN:  [id number] Principal Diagnosis: Schizoaffective disorder, bipolar type (HCC) Diagnosis:  Principal Problem:   Schizoaffective disorder, bipolar type [facility]) Active Problems:   Malingering   Total Time spent with patient: 30 minutes  Subjective:   Dylan Jordan is a 65 y.o. male patient admitted [facility] ED with complaints of hallucinations.  HPI:  Dylan Jordan, 65 y.o., male patient seen via tele health by this provider, consulted with Dr. Nelly Rout; and chart reviewed on 11/15/20., male patient seen via tele health by this provider, consulted with Dr. Nelly Rout; and chart reviewed on 11/15/20.  On evaluation Dylan Jordan continued to demonstrate the indifference to the assessment as he did when initially presented to [facility] ED.  Patient gave the incorrect name "Pizza Phoffa" When asked where he was, he stated, "I went to the past" When asked where he lived, he states "I live in the future."  When asked where he lived he stated "I live."  When asked about suicidal ideation his response was "I live." When asked about homicidal ideation patient responded, "I only want to kill you."  Patient asked about auditory hallucinations, stating he is hearing voices but unable to describe what the voices are saying.  Patient asked about his ACTT team, and he stated "I don't know who that is.  Patient eventually admitted that he knew he was in a hospital, and that he was in [place]; but when asked about his date of birth he stated he was born [birth year] (rearranging the number of the year), then stating he was 11 yrs. old.  Patient asked to sit up and open his eyes to participate in assessment and he stated "my eyes are open" but they were closed.  During evaluation Dylan Jordan is laying supine in bed with his eyes closed.  Patient is in no acute distress.  He refuses to open eyes stating they are open.  He would not  turn towards camera.  Patient is aware that he is in a hospital but gave the name Dylan Jordan and not [name].  Patient states he is in [place] but purposely refuses to tell what city he is in and only stating that he has went to the past but lives in the future.  Observed patient concentrating on answer and rearranging his response.  Patient was calm throughout assessment.  His mood is indifferent or unconcerned; just laying in bed with eyes closed affect was congruent.  He does not appear to be responding to internal/external stimuli or delusional thoughts; but states he is hearing voices that he is unable to describe.  Patient denies suicidal ideation but states he wants to kill this provider when asked about suicidal ideation.  Patient not answering question about paranoia.  Patient asked to call his nurse back in room and stated "you call nurse."    Spoke to patients nurse for collateral and was informed that patient wouldn't take his insulin this morning, that he had been calm but giving a false name.  States that he ate breakfast and asked to take a shower this morning then back in bed.    Patient has had multiple urgent care and ED visits with similar complaints of hearing voices that he is unable to describe or figure out.  Often denies suicidal/homicidal ideation.  Patient is usually demanding and agitated or irritable when he doesn't get his way. On 11/08/20 patient went from [facility] to  [facility] ED and back to [facility] with same complaint.  Patient has ACTT services with [facility].  Will have social work to call and set up follow up.   There is no evidence of a psychiatric condition which is amendable to inpatient psychiatric hospitalization.  Patients' homicidal ideation and psychosis appears to be conditional and is a means of manipulation and attention seeking without a true intention to harm himself or others.  His behavior is more consistent with someone who is acting in self-preservation,  rather than someone who is acutely suicidal/homicidal or psychotic.  Given all that is stated above and including the fact that the patient shows no signs of mania or psychosis and has a liner, coherent thought process throughout the interview today, the patient does not meet criteria for inpatient psychiatric treatment or further psychiatric evaluation at this time.  Patient would benefit from outpatient treatment and following up with his ACTT team.  Patient has been give resources for other community services and outpatient psychiatric services.     Past Psychiatric History: Schizoaffective bipolar type, substance abuse  Risk to Self:  Denies Risk to Others:  No Prior Inpatient Therapy:  Yes Prior Outpatient Therapy:  Yes  Past Medical History:  Past Medical History:  Diagnosis Date   Anemia    Asthma    Diabetes mellitus without complication (HCC)    Gout    Hyperlipidemia    Hyperosmolar hyperglycemic coma due to diabetes mellitus without ketoacidosis (HCC)    Hypertension    Schizophrenia (HCC) 07/06/2018    Past Surgical History:  Procedure Laterality Date   REPLACEMENT TOTAL KNEE Left    TOE SURGERY     Family History:  Family History  Adopted: Yes  Problem Relation Age of Onset   Allergic rhinitis Neg Hx    Angioedema Neg Hx    Asthma Neg Hx    Atopy Neg Hx    Eczema Neg Hx    Immunodeficiency Neg Hx    Urticaria Neg Hx    Family Psychiatric  History: Unaware Social History:  Social History   Substance and Sexual Activity  Alcohol Use No     Social History   Substance and Sexual Activity  Drug Use No    Social History   Socioeconomic History   Marital status: Single    Spouse name: Not on file   Number of children: Not on file   Years of education: Not on file   Highest education level: Not on file  Occupational History   Not on file  Tobacco Use   Smoking status: Some Days    Packs/day: 0.30    Years: 45.00    Pack years: 13.50    Types:  Cigarettes    Start date: 05/16/1971   Smokeless tobacco: Never   Tobacco comments:    Decreased Intake to 10 cigarettes per week or less.  Vaping Use   Vaping Use: Never used  Substance and Sexual Activity   Alcohol use: No   Drug use: No   Sexual activity: Not on file  Other Topics Concern   Not on file  Social History Narrative   Lives by himself at home. Closest family member is niece.       ETOH none   Drugs non e   Current smoker, none for three days    Social Determinants of Health   Financial Resource Strain: Not on file  Food Insecurity: Not on file  Transportation Needs: Not on  file  Physical Activity: Not on file  Stress: Not on file  Social Connections: Not on file   Additional Social History:    Allergies:   Allergies  Allergen Reactions   Chantix [Varenicline Tartrate] Other (See Comments)    Nightmares   Ativan [Lorazepam] Other (See Comments)    Agitation (and "5 others like it") Will need to call [facility] [place] (phone) [phone] and [phone] to fax a release-    Other Other (See Comments)    Chinese Rubber Tree Stem Bark- sneezing   Pineapple Other (See Comments)    Pt states "all my allergies make me feel eerie." " It does not make me feel absolute."    Penicillins Diarrhea    Labs:  Results for orders placed or performed during the hospital encounter of 11/14/20 (from the past 48 hour(s))  Comprehensive metabolic panel     Status: Abnormal   Collection Time: 11/14/20  9:15 PM  Result Value Ref Range   Sodium 139 135 - 145 mmol/L   Potassium 3.9 3.5 - 5.1 mmol/L   Chloride 113 (H) 98 - 111 mmol/L   CO2 19 (L) 22 - 32 mmol/L   Glucose, Bld 207 (H) 70 - 99 mg/dL    Comment: Glucose reference range applies only to samples taken after fasting for at least 8 hours.   BUN 16 8 - 23 mg/dL   Creatinine, Ser 2.951.33 (H) 0.61 - 1.24 mg/dL   Calcium 9.2 8.9 - 62.110.3 mg/dL   Total Protein 7.1 6.5 - 8.1 g/dL   Albumin 3.3 (L) 3.5 -  5.0 g/dL   AST 14 (L) 15 - 41 U/L   ALT 16 0 - 44 U/L   Alkaline Phosphatase 61 38 - 126 U/L   Total Bilirubin 0.8 0.3 - 1.2 mg/dL   GFR, Estimated 60 (L) >60 mL/min    Comment: (NOTE) Calculated using the CKD-EPI Creatinine Equation (2021)    Anion gap 7 5 - 15    Comment: Performed at [facility], [address] [place], [place] [address]  Ethanol     Status: None   Collection Time: 11/14/20  9:15 PM  Result Value Ref Range   Alcohol, Ethyl (B) <10 <10 mg/dL    Comment: (NOTE) Lowest detectable limit for serum alcohol is 10 mg/dL.  For medical purposes only. Performed at [facility], [address] [place], [place] [address]   CBC with Diff     Status: Abnormal   Collection Time: 11/14/20  9:15 PM  Result Value Ref Range   WBC 6.0 4.0 - 10.5 K/uL   RBC 4.48 4.22 - 5.81 MIL/uL   Hemoglobin 12.1 (L) 13.0 - 17.0 g/dL   HCT 62.939.4 52.839.0 - 41.352.0 %   MCV 87.9 80.0 - 100.0 fL   MCH 27.0 26.0 - 34.0 pg   MCHC 30.7 30.0 - 36.0 g/dL   RDW 24.414.3 01.011.5 - 27.215.5 %   Platelets 230 150 - 400 K/uL   nRBC 0.0 0.0 - 0.2 %   Neutrophils Relative % 59 %   Neutro Abs 3.5 1.7 - 7.7 K/uL   Lymphocytes Relative 30 %   Lymphs Abs 1.8 0.7 - 4.0 K/uL   Monocytes Relative 7 %   Monocytes Absolute 0.4 0.1 - 1.0 K/uL   Eosinophils Relative 3 %   Eosinophils Absolute 0.2 0.0 - 0.5 K/uL   Basophils Relative 1 %   Basophils Absolute 0.0 0.0 -  0.1 K/uL   Immature Granulocytes 0 %   Abs Immature Granulocytes 0.02 0.00 - 0.07 K/uL    Comment: Performed at [facility], [address], [place], [place] [address]  Resp Panel by RT-PCR (Flu A&B, Covid) Nasopharyngeal Swab     Status: None   Collection Time: 11/14/20 10:52 PM   Specimen: Nasopharyngeal Swab; Nasopharyngeal(NP) swabs in vial transport medium  Result Value Ref Range   SARS Coronavirus 2 by RT PCR NEGATIVE NEGATIVE    Comment: (NOTE) SARS-CoV-2 target nucleic acids are NOT DETECTED.  The SARS-CoV-2 RNA is generally  detectable in upper respiratory specimens during the acute phase of infection. The lowest concentration of SARS-CoV-2 viral copies this assay can detect is 138 copies/mL. A negative result does not preclude SARS-Cov-2 infection and should not be used as the sole basis for treatment or other patient management decisions. A negative result may occur with  improper specimen collection/handling, submission of specimen other than nasopharyngeal swab, presence of viral mutation(s) within the areas targeted by this assay, and inadequate number of viral copies(<138 copies/mL). A negative result must be combined with clinical observations, patient history, and epidemiological information. The expected result is Negative.  Fact Sheet for Patients:  BloggerCourse.com  Fact Sheet for Healthcare Providers:  SeriousBroker.it  This test is no t yet approved or cleared by the [place] FDA and  has been authorized for detection and/or diagnosis of SARS-CoV-2 by FDA under an Emergency Use Authorization (EUA). This EUA will remain  in effect (meaning this test can be used) for the duration of the COVID-19 declaration under Section 564(b)(1) of the Act, 21 U.S.C.section 360bbb-3(b)(1), unless the authorization is terminated  or revoked sooner.       Influenza A by PCR NEGATIVE NEGATIVE   Influenza B by PCR NEGATIVE NEGATIVE    Comment: (NOTE) The Xpert Xpress SARS-CoV-2/FLU/RSV plus assay is intended as an aid in the diagnosis of influenza from Nasopharyngeal swab specimens and should not be used as a sole basis for treatment. Nasal washings and aspirates are unacceptable for Xpert Xpress SARS-CoV-2/FLU/RSV testing.  Fact Sheet for Patients: BloggerCourse.com  Fact Sheet for Healthcare Providers: SeriousBroker.it  This test is not yet approved or cleared by the [place] FDA and has  been authorized for detection and/or diagnosis of SARS-CoV-2 by FDA under an Emergency Use Authorization (EUA). This EUA will remain in effect (meaning this test can be used) for the duration of the COVID-19 declaration under Section 564(b)(1) of the Act, 21 U.S.C. section 360bbb-3(b)(1), unless the authorization is terminated or revoked.  Performed at [facility], [address] [place], [place] [address]   Urine rapid drug screen (hosp performed)     Status: None   Collection Time: 11/14/20 11:47 PM  Result Value Ref Range   Opiates NONE DETECTED NONE DETECTED   Cocaine NONE DETECTED NONE DETECTED   Benzodiazepines NONE DETECTED NONE DETECTED   Amphetamines NONE DETECTED NONE DETECTED   Tetrahydrocannabinol NONE DETECTED NONE DETECTED   Barbiturates NONE DETECTED NONE DETECTED    Comment: (NOTE) DRUG SCREEN FOR MEDICAL PURPOSES ONLY.  IF CONFIRMATION IS NEEDED FOR ANY PURPOSE, NOTIFY LAB WITHIN 5 DAYS.  LOWEST DETECTABLE LIMITS FOR URINE DRUG SCREEN Drug Class                     Cutoff (ng/mL) Amphetamine and metabolites    1000 Barbiturate and metabolites    200 Benzodiazepine  200 Tricyclics and metabolites     300 Opiates and metabolites        300 Cocaine and metabolites        300 THC                            50 Performed at [facility], [address], [place], [place] [address]   CBG monitoring, ED     Status: Abnormal   Collection Time: 11/15/20  7:46 AM  Result Value Ref Range   Glucose-Capillary 193 (H) 70 - 99 mg/dL    Comment: Glucose reference range applies only to samples taken after fasting for at least 8 hours.    Medications:  Current Facility-Administered Medications  Medication Dose Route Frequency Provider Last Rate Last Admin   insulin aspart (novoLOG) injection 0-15 Units  0-15 Units Subcutaneous TID WC Long, Arlyss Repress, MD       nicotine (NICODERM CQ - dosed in mg/24 hours) patch 21 mg  21 mg Transdermal Daily  Long, Arlyss Repress, MD   21 mg at 11/15/20 0950   risperiDONE (RISPERDAL M-TABS) disintegrating tablet 2 mg  2 mg Oral Q8H PRN Long, Arlyss Repress, MD   2 mg at 11/15/20 0408   And   ziprasidone (GEODON) injection 20 mg  20 mg Intramuscular PRN Long, Arlyss Repress, MD       Current Outpatient Medications  Medication Sig Dispense Refill   albuterol (VENTOLIN HFA) 108 (90 Base) MCG/ACT inhaler Inhale 1-2 puffs into the lungs every 4 (four) hours as needed for wheezing or shortness of breath. 18 g 1   amLODipine (NORVASC) 10 MG tablet Take 1 tablet (10 mg total) by mouth daily. 30 tablet 1   atorvastatin (LIPITOR) 20 MG tablet Take 1 tablet (20 mg total) by mouth at bedtime. 30 tablet 1   benztropine (COGENTIN) 1 MG tablet Take 1 tablet (1 mg total) by mouth 2 (two) times daily. 60 tablet 1   desmopressin (DDAVP) 0.2 MG tablet Take 1 tablet (0.2 mg total) by mouth at bedtime. 30 tablet 1   ferrous sulfate 325 (65 FE) MG tablet Take 1 tablet (325 mg total) by mouth every other day. 15 tablet 1   gabapentin (NEURONTIN) 300 MG capsule Take 2 capsules (600 mg total) by mouth 3 (three) times daily. 180 capsule 0   insulin aspart (NOVOLOG) 100 UNIT/ML injection Inject 9 Units into the skin 3 (three) times daily with meals. 10 mL 1   insulin glargine (LANTUS) 100 UNIT/ML injection Inject 0.4 mLs (40 Units total) into the skin daily. 10 mL 2   lisinopril (ZESTRIL) 10 MG tablet Take 1 tablet (10 mg total) by mouth daily. 30 tablet 1   Melatonin 10 MG TABS Take 10 mg by mouth at bedtime. 30 tablet 1   metformin (FORTAMET) 1000 MG (OSM) 24 hr tablet Take 1 tablet (1,000 mg total) by mouth daily with breakfast. 30 tablet 1   nicotine (NICODERM CQ - DOSED IN MG/24 HOURS) 21 mg/24hr patch Place 1 patch (21 mg total) onto the skin daily. 28 patch 0   OLANZapine (ZYPREXA) 10 MG tablet Take 1 tablet (10 mg total) by mouth in the morning and at bedtime. 30 tablet 1   paliperidone (INVEGA) 9 MG 24 hr tablet Take 1 tablet (9 mg  total) by mouth daily. 30 tablet 1   pantoprazole (PROTONIX) 40 MG tablet Take 1 tablet (40 mg total) by  mouth daily. 30 tablet 1    Musculoskeletal: Strength & Muscle Tone: within normal limits Gait & Station: normal Patient leans: N/A    Psychiatric Specialty Exam:  Presentation  General Appearance: Appropriate for Environment  Eye Contact:None  Speech:Clear and Coherent; Normal Rate  Speech Volume:Normal  Handedness:Right   Mood and Affect  Mood:-- (Indifferent or unconcerned)  Affect:Congruent   Thought Process  Thought Processes:Linear  Descriptions of Associations:Intact  Orientation:Full (Time, Place and Person) (Patient giving a false name but answers to correct name.  He gave the correct month and day of birth but rearranged the year ([birth year]).  Correct state, and place.  It is obvious that patient is taking his time to give the incorrect information.  Acting.)  Thought Content:Other (comment) (Patient purposely giving incorrect information.  It is noted that patient is concentration on factitious answers that he is giving.)  History of Schizophrenia/Schizoaffective disorder:Yes  Duration of Psychotic Symptoms:Greater than six months  Hallucinations:Hallucinations: -- (Reporting he is hearing voices) Description of Auditory Hallucinations: Patient unable to describe what voices are saying.  Patient calm, and doesn't appear to be responding to internal or external stimuli  Ideas of Reference:None  Suicidal Thoughts:Suicidal Thoughts: No  Homicidal Thoughts:Homicidal Thoughts: No (When asked about wanting to hurt of kill others he responded "I only want to kill you")   Sensorium  Memory:Other (comment) (Patient would not answer questions.)  Judgment:Intact  Insight:Shallow   Executive Functions  Concentration:Good  Attention Span:Good  Recall:Other (comment) (Unable to determine related to patients response to questions)  Fund of  Knowledge:Good  Language:Good   Psychomotor Activity  Psychomotor Activity:Psychomotor Activity: Normal   Assets  Assets:Communication Skills; Social Support; Financial Resources/Insurance   Sleep  Sleep:Sleep: Good    Physical Exam: Physical Exam Vitals and nursing note reviewed. Exam conducted with a chaperone present.  Constitutional:      General: He is not in acute distress.    Appearance: Normal appearance. He is not ill-appearing.  Cardiovascular:     Rate and Rhythm: Normal rate.  Pulmonary:     Effort: Pulmonary effort is normal.  Neurological:     Mental Status: He is alert and oriented to person, place, and time.  Psychiatric:        Attention and Perception: Attention and perception normal. He does not perceive auditory or visual hallucinations.        Mood and Affect: Affect normal.        Speech: Speech normal.        Thought Content: Thought content is not paranoid or delusional. Thought content does not include homicidal or suicidal ideation.        Cognition and Memory: Cognition normal.   Review of Systems  Reason unable to perform ROS: Patient refusing to participate.  Psychiatric/Behavioral:  Negative for suicidal ideas. Hallucinations: Reporting auditory hallucinations.  Blood pressure (!) 141/70, pulse 73, temperature 98.8 F (37.1 C), temperature source Oral, resp. rate 18, height 6\' 4"  (1.93 m), weight 103.2 kg, SpO2 96 %. Body mass index is 27.69 kg/m.  Treatment Plan Summary: Plan Psychiatrically clear to follow up with ACTT.  Social work consult to contact ACTT team  Disposition:  Psychiatrically clear No evidence of imminent risk to self or others at present.   Patient does not meet criteria for psychiatric inpatient admission. Supportive therapy provided about ongoing stressors. Discussed crisis plan, support from social network, calling 911, coming to the Emergency Department, and calling Suicide Hotline.  This service was provided  via telemedicine using a 2-way, interactive audio and Immunologist.  Names of all persons participating in this telemedicine service and their role in this encounter. Name: Assunta Found, NP Role: PMHNP  Name: Dr. Nelly Rout Role: Psychiatrist  Name: Parthenia Ames Role: Patient  Name: Marylene Land, RN Role: Patient's nurse:  Sherron Monday to via telephone for collateral information on patients behavior Sent a secure message informing:  Psychiatric consult completed and patient psychiatrically cleared.  Ordered social work/TOC consult to get in touch with patients ACTT team at [facility].  Please inform MD only default listed    Kyna Blahnik, NP 11/15/2020 10:24 AM

## 2020-11-15 NOTE — ED Notes (Signed)
Security at bedside, discharge information given to patient.

## 2020-11-15 NOTE — ED Notes (Signed)
Patient seen by SW ACTT information given. Patient verbally stating he does not want to leave the facility,

## 2020-11-16 NOTE — ED Notes (Signed)
Patient refusing to leave, security at bedside. Bus pass given to patient.

## 2020-11-22 ENCOUNTER — Emergency Department (HOSPITAL_COMMUNITY)
Admission: EM | Admit: 2020-11-22 | Discharge: 2020-11-23 | Disposition: A | Discharge status: Home / Self Care | Payer: Medicaid Other | Attending: Emergency Medicine | Admitting: Emergency Medicine

## 2020-11-22 DIAGNOSIS — G8929 Other chronic pain: Secondary | ICD-10-CM | POA: Insufficient documentation

## 2020-11-22 DIAGNOSIS — E1122 Type 2 diabetes mellitus with diabetic chronic kidney disease: Secondary | ICD-10-CM | POA: Insufficient documentation

## 2020-11-22 DIAGNOSIS — Z79899 Other long term (current) drug therapy: Secondary | ICD-10-CM | POA: Diagnosis not present

## 2020-11-22 DIAGNOSIS — R609 Edema, unspecified: Secondary | ICD-10-CM

## 2020-11-22 DIAGNOSIS — R600 Localized edema: Secondary | ICD-10-CM | POA: Diagnosis not present

## 2020-11-22 DIAGNOSIS — Z7984 Long term (current) use of oral hypoglycemic drugs: Secondary | ICD-10-CM | POA: Insufficient documentation

## 2020-11-22 DIAGNOSIS — J4530 Mild persistent asthma, uncomplicated: Secondary | ICD-10-CM | POA: Diagnosis not present

## 2020-11-22 DIAGNOSIS — F17210 Nicotine dependence, cigarettes, uncomplicated: Secondary | ICD-10-CM | POA: Diagnosis not present

## 2020-11-22 DIAGNOSIS — Z794 Long term (current) use of insulin: Secondary | ICD-10-CM | POA: Diagnosis not present

## 2020-11-22 DIAGNOSIS — E1140 Type 2 diabetes mellitus with diabetic neuropathy, unspecified: Secondary | ICD-10-CM | POA: Diagnosis not present

## 2020-11-22 DIAGNOSIS — N1831 Chronic kidney disease, stage 3a: Secondary | ICD-10-CM | POA: Insufficient documentation

## 2020-11-22 DIAGNOSIS — M25561 Pain in right knee: Secondary | ICD-10-CM | POA: Diagnosis not present

## 2020-11-22 DIAGNOSIS — R202 Paresthesia of skin: Secondary | ICD-10-CM | POA: Insufficient documentation

## 2020-11-22 DIAGNOSIS — Z96652 Presence of left artificial knee joint: Secondary | ICD-10-CM | POA: Diagnosis not present

## 2020-11-22 DIAGNOSIS — I129 Hypertensive chronic kidney disease with stage 1 through stage 4 chronic kidney disease, or unspecified chronic kidney disease: Secondary | ICD-10-CM | POA: Insufficient documentation

## 2020-11-22 NOTE — ED Notes (Signed)
Called x1 no response

## 2020-11-22 NOTE — ED Notes (Signed)
This RN called pt again for triage, no answer. Approached [employer] to inquire about pt's IVC status, pt's location in lobby. Was advised by [employer] That [facility] transported pt to hospital but that pt was not in fact under IVC. According to front desk/registration, [facility] did not present paperwork, just escorted pt. Registration did not place ID band on pt. Pt located in lobby, ID band placed by staff, pt to be triaged according to chief complaint.

## 2020-11-22 NOTE — ED Notes (Signed)
Pt called for triage, no response. 

## 2020-11-23 ENCOUNTER — Emergency Department (HOSPITAL_COMMUNITY): Payer: Medicaid Other

## 2020-11-23 ENCOUNTER — Emergency Department (HOSPITAL_COMMUNITY)
Admission: EM | Admit: 2020-11-23 | Discharge: 2020-11-24 | Disposition: A | Discharge status: Home / Self Care | Payer: Medicaid Other | Source: Home / Self Care | Attending: Emergency Medicine | Admitting: Emergency Medicine

## 2020-11-23 ENCOUNTER — Encounter (HOSPITAL_COMMUNITY): Payer: Self-pay | Admitting: Emergency Medicine

## 2020-11-23 ENCOUNTER — Other Ambulatory Visit: Payer: Self-pay

## 2020-11-23 DIAGNOSIS — G8929 Other chronic pain: Secondary | ICD-10-CM | POA: Insufficient documentation

## 2020-11-23 DIAGNOSIS — D631 Anemia in chronic kidney disease: Secondary | ICD-10-CM | POA: Insufficient documentation

## 2020-11-23 DIAGNOSIS — E1140 Type 2 diabetes mellitus with diabetic neuropathy, unspecified: Secondary | ICD-10-CM | POA: Insufficient documentation

## 2020-11-23 DIAGNOSIS — Z79899 Other long term (current) drug therapy: Secondary | ICD-10-CM | POA: Insufficient documentation

## 2020-11-23 DIAGNOSIS — F17210 Nicotine dependence, cigarettes, uncomplicated: Secondary | ICD-10-CM | POA: Insufficient documentation

## 2020-11-23 DIAGNOSIS — Z7984 Long term (current) use of oral hypoglycemic drugs: Secondary | ICD-10-CM | POA: Insufficient documentation

## 2020-11-23 DIAGNOSIS — J4530 Mild persistent asthma, uncomplicated: Secondary | ICD-10-CM | POA: Insufficient documentation

## 2020-11-23 DIAGNOSIS — Z794 Long term (current) use of insulin: Secondary | ICD-10-CM | POA: Insufficient documentation

## 2020-11-23 DIAGNOSIS — I129 Hypertensive chronic kidney disease with stage 1 through stage 4 chronic kidney disease, or unspecified chronic kidney disease: Secondary | ICD-10-CM | POA: Insufficient documentation

## 2020-11-23 DIAGNOSIS — Z96652 Presence of left artificial knee joint: Secondary | ICD-10-CM | POA: Insufficient documentation

## 2020-11-23 DIAGNOSIS — M25561 Pain in right knee: Secondary | ICD-10-CM | POA: Insufficient documentation

## 2020-11-23 DIAGNOSIS — E1122 Type 2 diabetes mellitus with diabetic chronic kidney disease: Secondary | ICD-10-CM | POA: Insufficient documentation

## 2020-11-23 DIAGNOSIS — N1831 Chronic kidney disease, stage 3a: Secondary | ICD-10-CM | POA: Insufficient documentation

## 2020-11-23 LAB — URINALYSIS, ROUTINE W REFLEX MICROSCOPIC
Bilirubin Urine: NEGATIVE
Glucose, UA: 150 mg/dL — AB
Hgb urine dipstick: NEGATIVE
Ketones, ur: NEGATIVE mg/dL
Leukocytes,Ua: NEGATIVE
Nitrite: NEGATIVE
Protein, ur: 30 mg/dL — AB
Specific Gravity, Urine: 1.008 (ref 1.005–1.030)
pH: 5 (ref 5.0–8.0)

## 2020-11-23 LAB — CBC WITH DIFFERENTIAL/PLATELET
Abs Immature Granulocytes: 0.01 10*3/uL (ref 0.00–0.07)
Basophils Absolute: 0 10*3/uL (ref 0.0–0.1)
Basophils Relative: 0 %
Eosinophils Absolute: 0.1 10*3/uL (ref 0.0–0.5)
Eosinophils Relative: 2 %
HCT: 39.3 % (ref 39.0–52.0)
Hemoglobin: 12.4 g/dL — ABNORMAL LOW (ref 13.0–17.0)
Immature Granulocytes: 0 %
Lymphocytes Relative: 33 %
Lymphs Abs: 1.6 10*3/uL (ref 0.7–4.0)
MCH: 27.3 pg (ref 26.0–34.0)
MCHC: 31.6 g/dL (ref 30.0–36.0)
MCV: 86.4 fL (ref 80.0–100.0)
Monocytes Absolute: 0.4 10*3/uL (ref 0.1–1.0)
Monocytes Relative: 8 %
Neutro Abs: 2.8 10*3/uL (ref 1.7–7.7)
Neutrophils Relative %: 57 %
Platelets: 317 10*3/uL (ref 150–400)
RBC: 4.55 MIL/uL (ref 4.22–5.81)
RDW: 14.1 % (ref 11.5–15.5)
WBC: 4.9 10*3/uL (ref 4.0–10.5)
nRBC: 0 % (ref 0.0–0.2)

## 2020-11-23 LAB — COMPREHENSIVE METABOLIC PANEL
ALT: 18 U/L (ref 0–44)
AST: 16 U/L (ref 15–41)
Albumin: 3.6 g/dL (ref 3.5–5.0)
Alkaline Phosphatase: 85 U/L (ref 38–126)
Anion gap: 6 (ref 5–15)
BUN: 15 mg/dL (ref 8–23)
CO2: 22 mmol/L (ref 22–32)
Calcium: 9.7 mg/dL (ref 8.9–10.3)
Chloride: 110 mmol/L (ref 98–111)
Creatinine, Ser: 1.09 mg/dL (ref 0.61–1.24)
GFR, Estimated: >60 mL/min (ref >60)
Glucose, Bld: 267 mg/dL — ABNORMAL HIGH (ref 70–99)
Potassium: 4.6 mmol/L (ref 3.5–5.1)
Sodium: 138 mmol/L (ref 135–145)
Total Bilirubin: 0.6 mg/dL (ref 0.3–1.2)
Total Protein: 7.4 g/dL (ref 6.5–8.1)

## 2020-11-23 LAB — BRAIN NATRIURETIC PEPTIDE: B Natriuretic Peptide: 15.2 pg/mL (ref 0.0–100.0)

## 2020-11-23 LAB — ETHANOL: Alcohol, Ethyl (B): <10 mg/dL (ref <10)

## 2020-11-23 NOTE — ED Triage Notes (Signed)
Patient was brought to ED via [facility] sherriff's department for bilateral leg swelling.  Patient was at his niece's house and asked to leave.  Patient denies any SI or HI.

## 2020-11-23 NOTE — Discharge Instructions (Addendum)
As discussed, your evaluation today has been largely reassuring.  But, it is important that you monitor your condition carefully, and do not hesitate to return to the ED if you develop new, or concerning changes in your condition. ? ?Otherwise, please follow-up with your physician for appropriate ongoing care. ? ?

## 2020-11-23 NOTE — ED Provider Notes (Signed)
[facility] EMERGENCY DEPARTMENT Provider Note   CSN: [id number] Arrival date & time: 11/22/20  2030     History Chief Complaint  Patient presents with   Leg Swelling    Dylan Jordan is a 65 y.o. male.  HPI Patient with multiple medical issues including schizophrenia, diabetes, hypertension presents after initially going to the sheriff department, but there he was found to have bilateral leg swelling and sent here for testing. Patient notes swelling symmetric in the lower extremities unclear time of onset with associated tingling sensation.  He acknowledges not taking any medication in a long time. He is homeless. He does note that he was seen at [facility] facility yesterday, is unclear what occurred. He denies discomfort beyond the level of mid calf, dyspnea, chest pain or any other complaints.    Past Medical History:  Diagnosis Date   Anemia    Asthma    Diabetes mellitus without complication (HCC)    Gout    Hyperlipidemia    Hyperosmolar hyperglycemic coma due to diabetes mellitus without ketoacidosis (HCC)    Hypertension    Schizophrenia (HCC) 07/06/2018    Patient Active Problem List   Diagnosis Date Noted   Malingering 11/15/2020   Homelessness 09/05/2020   Stage 3a chronic kidney disease (HCC) 04/27/2020   Slow transit constipation 04/27/2020   Noncompliance with diabetes treatment 04/20/2020   Uncontrolled type 2 diabetes mellitus with hyperglycemia (HCC) 04/20/2020   Hyperkalemia, diminished renal excretion 03/29/2020   Hyperkalemia    Schizoaffective disorder, bipolar type (HCC) 02/21/2019   Hallucinations    Anxiety 09/18/2018   Swelling of both lower extremities 09/13/2018   Type 2 diabetes mellitus without complication, with long-term current use of insulin (HCC)    Anemia    Hyperlipidemia 12/18/2017   Mixed stress and urge urinary incontinence 11/25/2017   Intermittent chest pain 11/25/2017   Allergic rhinitis with a  nonallergic component 07/30/2017   Food allergy 07/30/2017   Chronic gout involving toe of left foot without tophus 05/18/2017   Hemorrhoids 05/04/2017   Chronic low back pain 01/03/2017   Chronic cough 06/30/2016   Drug-induced mood disorder (HCC) 06/14/2016   Type 2 diabetes mellitus with diabetic neuropathy (HCC) 05/26/2016   Iron deficiency anemia 05/26/2016   Chronic kidney disease 05/26/2016   HTN (hypertension) 05/26/2016   Tobacco abuse 05/26/2016   History of substance abuse (HCC) 05/26/2016   History of alcohol abuse 05/26/2016   Mild persistent asthma 05/26/2016    Past Surgical History:  Procedure Laterality Date   REPLACEMENT TOTAL KNEE Left    TOE SURGERY         Family History  Adopted: Yes  Problem Relation Age of Onset   Allergic rhinitis Neg Hx    Angioedema Neg Hx    Asthma Neg Hx    Atopy Neg Hx    Eczema Neg Hx    Immunodeficiency Neg Hx    Urticaria Neg Hx     Social History   Tobacco Use   Smoking status: Some Days    Packs/day: 0.30    Years: 45.00    Pack years: 13.50    Types: Cigarettes    Start date: 05/16/1971   Smokeless tobacco: Never   Tobacco comments:    Decreased Intake to 10 cigarettes per week or less.  Vaping Use   Vaping Use: Never used  Substance Use Topics   Alcohol use: No   Drug use: No    Home  Medications Prior to Admission medications   Medication Sig Start Date End Date Taking? Authorizing Provider  albuterol (VENTOLIN HFA) 108 (90 Base) MCG/ACT inhaler Inhale 1-2 puffs into the lungs every 4 (four) hours as needed for wheezing or shortness of breath. 10/29/20   Willy Eddy, MD  amLODipine (NORVASC) 10 MG tablet Take 1 tablet (10 mg total) by mouth daily. 10/29/20   Willy Eddy, MD  atorvastatin (LIPITOR) 20 MG tablet Take 1 tablet (20 mg total) by mouth at bedtime. 10/29/20 12/28/20  Willy Eddy, MD  benztropine (COGENTIN) 1 MG tablet Take 1 tablet (1 mg total) by mouth 2 (two) times daily.  10/29/20   Willy Eddy, MD  desmopressin (DDAVP) 0.2 MG tablet Take 1 tablet (0.2 mg total) by mouth at bedtime. 10/29/20   Willy Eddy, MD  ferrous sulfate 325 (65 FE) MG tablet Take 1 tablet (325 mg total) by mouth every other day. 10/29/20   Willy Eddy, MD  gabapentin (NEURONTIN) 300 MG capsule Take 2 capsules (600 mg total) by mouth 3 (three) times daily. 10/29/20 11/28/20  Willy Eddy, MD  insulin aspart (NOVOLOG) 100 UNIT/ML injection Inject 9 Units into the skin 3 (three) times daily with meals. 10/29/20   Willy Eddy, MD  insulin glargine (LANTUS) 100 UNIT/ML injection Inject 0.4 mLs (40 Units total) into the skin daily. 10/29/20   Willy Eddy, MD  lisinopril (ZESTRIL) 10 MG tablet Take 1 tablet (10 mg total) by mouth daily. 10/29/20 11/28/20  Willy Eddy, MD  Melatonin 10 MG TABS Take 10 mg by mouth at bedtime. 10/29/20   Willy Eddy, MD  metformin (FORTAMET) 1000 MG (OSM) 24 hr tablet Take 1 tablet (1,000 mg total) by mouth daily with breakfast. 10/29/20   Willy Eddy, MD  nicotine (NICODERM CQ - DOSED IN MG/24 HOURS) 21 mg/24hr patch Place 1 patch (21 mg total) onto the skin daily. 10/29/20   Willy Eddy, MD  OLANZapine (ZYPREXA) 10 MG tablet Take 1 tablet (10 mg total) by mouth in the morning and at bedtime. 10/29/20 11/28/20  Willy Eddy, MD  paliperidone (INVEGA) 9 MG 24 hr tablet Take 1 tablet (9 mg total) by mouth daily. 10/29/20   Willy Eddy, MD  pantoprazole (PROTONIX) 40 MG tablet Take 1 tablet (40 mg total) by mouth daily. 10/29/20   Willy Eddy, MD    Allergies    Chantix [varenicline tartrate], Ativan [lorazepam], Other, Pineapple, and Penicillins  Review of Systems   Review of Systems  Constitutional:        Per HPI, otherwise negative  HENT:         Per HPI, otherwise negative  Respiratory:         Per HPI, otherwise negative  Cardiovascular:        Per HPI, otherwise negative  Gastrointestinal:   Negative for vomiting.  Endocrine:       Negative aside from HPI  Genitourinary:        Neg aside from HPI   Musculoskeletal:        Per HPI, otherwise negative  Skin: Negative.   Neurological:  Negative for syncope.  Psychiatric/Behavioral:         Per HPI otherwise negative   Physical Exam Updated Vital Signs BP 135/70 (BP Location: Left Arm)   Pulse 95   Temp 98.7 F (37.1 C) (Oral)   Resp 16   Ht 6\' 2"  (1.88 m)   Wt 98 kg   SpO2 95%   BMI 27.73 kg/m  Physical Exam Vitals and nursing note reviewed.  Constitutional:      General: He is not in acute distress.    Appearance: He is well-developed.  HENT:     Head: Normocephalic and atraumatic.  Eyes:     Conjunctiva/sclera: Conjunctivae normal.  Cardiovascular:     Rate and Rhythm: Normal rate and regular rhythm.     Pulses: Normal pulses.  Pulmonary:     Effort: Pulmonary effort is normal. No respiratory distress.     Breath sounds: No stridor.  Abdominal:     General: There is no distension.  Musculoskeletal:     Right lower leg: Edema present.     Left lower leg: Edema present.     Comments: Nonpitting mild edema roughly symmetric calves inferiorly.  Beyond the edema lower extremities are grossly unremarkable  Skin:    General: Skin is warm and dry.  Neurological:     Mental Status: He is alert and oriented to person, place, and time.     Comments: Distal sensation and reflexes wnl  Psychiatric:     Comments: Withdrawn, but pleasantly interactive    ED Results / Procedures / Treatments   Labs (all labs ordered are listed, but only abnormal results are displayed) Labs Reviewed  COMPREHENSIVE METABOLIC PANEL - Abnormal; Notable for the following components:      Result Value   Glucose, Bld 267 (*)    All other components within normal limits  CBC WITH DIFFERENTIAL/PLATELET - Abnormal; Notable for the following components:   Hemoglobin 12.4 (*)    All other components within normal limits  URINALYSIS,  ROUTINE W REFLEX MICROSCOPIC - Abnormal; Notable for the following components:   Color, Urine STRAW (*)    Glucose, UA 150 (*)    Protein, ur 30 (*)    Bacteria, UA RARE (*)    All other components within normal limits  ETHANOL  BRAIN NATRIURETIC PEPTIDE    EKG EKG Interpretation  Date/Time:  Tuesday November 23 2020 12:31:31 EDT Ventricular Rate:  67 PR Interval:  199 QRS Duration: 97 QT Interval:  400 QTC Calculation: 423 R Axis:   2 Text Interpretation: Sinus rhythm Atrial premature complexes Abnormal R-wave progression, early transition Abnormal ECG Confirmed by Gerhard Munch [phone]) on 11/23/2020 1:24:43 PM  Radiology No results found.  Procedures Procedures   Medications Ordered in ED Medications - No data to display  ED Course  I have reviewed the triage vital signs and the nursing notes.  Pertinent labs & imaging results that were available during my care of the patient were reviewed by me and considered in my medical decision making (see chart for details).   EMR notable for 15 prior ED visits in six months  1:24 PM Patient in no distress, hemodynamically unremarkable. Chart review, as above notable for multiple recent ED visits, as well as social work evaluation last week, during which she was provided multiple resources. Here, patient is in no distress, awake, alert, does have some slight swelling, but no evidence for hepatorenal dysfunction, nor congestive heart failure, no evidence for infection, low suspicion for DVT.  Patient appropriate for outpatient follow-up. Patient provided resources, and social work will contact the patient as available for additional assistance.  Final Clinical Impression(s) / ED Diagnoses Final diagnoses:  Peripheral edema     Gerhard Munch, MD 11/23/20 1331

## 2020-11-23 NOTE — ED Notes (Signed)
Pt provided sand which and drink bag

## 2020-11-23 NOTE — ED Triage Notes (Signed)
Pt endorses bilateral knee pain.

## 2020-11-23 NOTE — ED Provider Notes (Signed)
Emergency Medicine Provider Triage Evaluation Note  Dylan Jordan , a 65 y.o. male  was evaluated in triage.  Pt complains of right knee pain, going on for several years, denies any recent trauma to the area, patient has been seen here multiple times, for multiple complaints, rigidity seen here yesterday, very different complaint.  Review of Systems  Positive: Right knee pain Negative: Calf pain, shortness of breath  Physical Exam  BP 130/82   Pulse 78   Temp 98.6 F (37 C) (Oral)   Resp 16   SpO2 100%  Gen:   Awake, no distress   Resp:  Normal effort  MSK:   Moves extremities without difficulty  Other:    Medical Decision Making  Medically screening exam initiated at 7:24 PM.  Appropriate orders placed.  Dylan Jordan was informed that the remainder of the evaluation will be completed by another provider, this initial triage assessment does not replace that evaluation, and the importance of remaining in the ED until their evaluation is complete.  Patient presents with right knee pain imaging been ordered, patient need further work-up in the emergency department.   Carroll Sage, PA-C 11/23/20 1925    Melene Plan, DO 11/23/20 1931

## 2020-11-24 ENCOUNTER — Encounter (HOSPITAL_COMMUNITY): Payer: Self-pay | Admitting: Emergency Medicine

## 2020-11-24 ENCOUNTER — Other Ambulatory Visit: Payer: Self-pay

## 2020-11-24 ENCOUNTER — Encounter (HOSPITAL_COMMUNITY): Payer: Self-pay | Admitting: Registered Nurse

## 2020-11-24 ENCOUNTER — Ambulatory Visit (HOSPITAL_COMMUNITY)
Admission: EM | Admit: 2020-11-24 | Discharge: 2020-11-24 | Disposition: A | Discharge status: Home / Self Care | Payer: Medicaid Other | Attending: Registered Nurse | Admitting: Registered Nurse

## 2020-11-24 ENCOUNTER — Emergency Department (HOSPITAL_COMMUNITY)
Admission: EM | Admit: 2020-11-24 | Discharge: 2020-11-24 | Disposition: A | Discharge status: Home / Self Care | Payer: Medicaid Other | Attending: Emergency Medicine | Admitting: Emergency Medicine

## 2020-11-24 DIAGNOSIS — F17210 Nicotine dependence, cigarettes, uncomplicated: Secondary | ICD-10-CM | POA: Insufficient documentation

## 2020-11-24 DIAGNOSIS — Z7984 Long term (current) use of oral hypoglycemic drugs: Secondary | ICD-10-CM | POA: Insufficient documentation

## 2020-11-24 DIAGNOSIS — I129 Hypertensive chronic kidney disease with stage 1 through stage 4 chronic kidney disease, or unspecified chronic kidney disease: Secondary | ICD-10-CM | POA: Diagnosis not present

## 2020-11-24 DIAGNOSIS — E1122 Type 2 diabetes mellitus with diabetic chronic kidney disease: Secondary | ICD-10-CM | POA: Insufficient documentation

## 2020-11-24 DIAGNOSIS — Z765 Malingerer [conscious simulation]: Secondary | ICD-10-CM

## 2020-11-24 DIAGNOSIS — N1831 Chronic kidney disease, stage 3a: Secondary | ICD-10-CM | POA: Diagnosis not present

## 2020-11-24 DIAGNOSIS — Z5900 Homelessness unspecified: Secondary | ICD-10-CM | POA: Diagnosis not present

## 2020-11-24 DIAGNOSIS — M25561 Pain in right knee: Secondary | ICD-10-CM | POA: Insufficient documentation

## 2020-11-24 DIAGNOSIS — H5789 Other specified disorders of eye and adnexa: Secondary | ICD-10-CM | POA: Insufficient documentation

## 2020-11-24 DIAGNOSIS — G8929 Other chronic pain: Secondary | ICD-10-CM | POA: Diagnosis not present

## 2020-11-24 DIAGNOSIS — F250 Schizoaffective disorder, bipolar type: Secondary | ICD-10-CM | POA: Diagnosis not present

## 2020-11-24 DIAGNOSIS — E119 Type 2 diabetes mellitus without complications: Secondary | ICD-10-CM | POA: Insufficient documentation

## 2020-11-24 DIAGNOSIS — Z794 Long term (current) use of insulin: Secondary | ICD-10-CM | POA: Diagnosis not present

## 2020-11-24 DIAGNOSIS — Z79899 Other long term (current) drug therapy: Secondary | ICD-10-CM | POA: Diagnosis not present

## 2020-11-24 DIAGNOSIS — J45909 Unspecified asthma, uncomplicated: Secondary | ICD-10-CM | POA: Insufficient documentation

## 2020-11-24 DIAGNOSIS — M25562 Pain in left knee: Secondary | ICD-10-CM | POA: Diagnosis not present

## 2020-11-24 LAB — CBG MONITORING, ED: Glucose-Capillary: 180 mg/dL — ABNORMAL HIGH (ref 70–99)

## 2020-11-24 NOTE — ED Triage Notes (Signed)
Per ems, pt reports bilateral knee pain that started several years ago and having difficulty getting around recently. Left knee replacement 4 years ago. EMS VSS. Pt also is reporting blindness. Pt has been vision impaired for many years but recently broke his glasses.

## 2020-11-24 NOTE — ED Provider Notes (Signed)
[facility] EMERGENCY DEPARTMENT Provider Note   CSN: [id number] Arrival date & time: 11/23/20  1801     History Chief Complaint  Patient presents with   Knee Pain    Dylan Jordan is a 65 y.o. male.  The history is provided by the patient and medical records.  Knee Pain  66 y.o. M with hx of anemia, asthma, DM, gout, HLP, HTN, schizophrenia, presenting to the ED for right knee pain.  This has been ongoing for "a while".  Denies new injury.  He tells me that his knee is "shattered" but remains ambulatory with cane.  Patient has had numerous ED visits recently at various hospitals.  Past Medical History:  Diagnosis Date   Anemia    Asthma    Diabetes mellitus without complication (HCC)    Gout    Hyperlipidemia    Hyperosmolar hyperglycemic coma due to diabetes mellitus without ketoacidosis (HCC)    Hypertension    Schizophrenia (HCC) 07/06/2018    Patient Active Problem List   Diagnosis Date Noted   Malingering 11/15/2020   Homelessness 09/05/2020   Stage 3a chronic kidney disease (HCC) 04/27/2020   Slow transit constipation 04/27/2020   Noncompliance with diabetes treatment 04/20/2020   Uncontrolled type 2 diabetes mellitus with hyperglycemia (HCC) 04/20/2020   Hyperkalemia, diminished renal excretion 03/29/2020   Hyperkalemia    Schizoaffective disorder, bipolar type (HCC) 02/21/2019   Hallucinations    Anxiety 09/18/2018   Swelling of both lower extremities 09/13/2018   Type 2 diabetes mellitus without complication, with long-term current use of insulin (HCC)    Anemia    Hyperlipidemia 12/18/2017   Mixed stress and urge urinary incontinence 11/25/2017   Intermittent chest pain 11/25/2017   Allergic rhinitis with a nonallergic component 07/30/2017   Food allergy 07/30/2017   Chronic gout involving toe of left foot without tophus 05/18/2017   Hemorrhoids 05/04/2017   Chronic low back pain 01/03/2017   Chronic cough 06/30/2016   Drug-induced  mood disorder (HCC) 06/14/2016   Type 2 diabetes mellitus with diabetic neuropathy (HCC) 05/26/2016   Iron deficiency anemia 05/26/2016   Chronic kidney disease 05/26/2016   HTN (hypertension) 05/26/2016   Tobacco abuse 05/26/2016   History of substance abuse (HCC) 05/26/2016   History of alcohol abuse 05/26/2016   Mild persistent asthma 05/26/2016    Past Surgical History:  Procedure Laterality Date   REPLACEMENT TOTAL KNEE Left    TOE SURGERY         Family History  Adopted: Yes  Problem Relation Age of Onset   Allergic rhinitis Neg Hx    Angioedema Neg Hx    Asthma Neg Hx    Atopy Neg Hx    Eczema Neg Hx    Immunodeficiency Neg Hx    Urticaria Neg Hx     Social History   Tobacco Use   Smoking status: Some Days    Packs/day: 0.30    Years: 45.00    Pack years: 13.50    Types: Cigarettes    Start date: 05/16/1971   Smokeless tobacco: Never   Tobacco comments:    Decreased Intake to 10 cigarettes per week or less.  Vaping Use   Vaping Use: Never used  Substance Use Topics   Alcohol use: No   Drug use: No    Home Medications Prior to Admission medications   Medication Sig Start Date End Date Taking? Authorizing Provider  albuterol (VENTOLIN HFA) 108 (90 Base) MCG/ACT inhaler Inhale  1-2 puffs into the lungs every 4 (four) hours as needed for wheezing or shortness of breath. 10/29/20   Willy Eddy, MD  amLODipine (NORVASC) 10 MG tablet Take 1 tablet (10 mg total) by mouth daily. 10/29/20   Willy Eddy, MD  atorvastatin (LIPITOR) 20 MG tablet Take 1 tablet (20 mg total) by mouth at bedtime. 10/29/20 12/28/20  Willy Eddy, MD  benztropine (COGENTIN) 1 MG tablet Take 1 tablet (1 mg total) by mouth 2 (two) times daily. 10/29/20   Willy Eddy, MD  desmopressin (DDAVP) 0.2 MG tablet Take 1 tablet (0.2 mg total) by mouth at bedtime. 10/29/20   Willy Eddy, MD  ferrous sulfate 325 (65 FE) MG tablet Take 1 tablet (325 mg total) by mouth every other  day. 10/29/20   Willy Eddy, MD  gabapentin (NEURONTIN) 300 MG capsule Take 2 capsules (600 mg total) by mouth 3 (three) times daily. 10/29/20 11/28/20  Willy Eddy, MD  insulin aspart (NOVOLOG) 100 UNIT/ML injection Inject 9 Units into the skin 3 (three) times daily with meals. 10/29/20   Willy Eddy, MD  insulin glargine (LANTUS) 100 UNIT/ML injection Inject 0.4 mLs (40 Units total) into the skin daily. 10/29/20   Willy Eddy, MD  lisinopril (ZESTRIL) 10 MG tablet Take 1 tablet (10 mg total) by mouth daily. 10/29/20 11/28/20  Willy Eddy, MD  Melatonin 10 MG TABS Take 10 mg by mouth at bedtime. 10/29/20   Willy Eddy, MD  metformin (FORTAMET) 1000 MG (OSM) 24 hr tablet Take 1 tablet (1,000 mg total) by mouth daily with breakfast. 10/29/20   Willy Eddy, MD  nicotine (NICODERM CQ - DOSED IN MG/24 HOURS) 21 mg/24hr patch Place 1 patch (21 mg total) onto the skin daily. 10/29/20   Willy Eddy, MD  OLANZapine (ZYPREXA) 10 MG tablet Take 1 tablet (10 mg total) by mouth in the morning and at bedtime. 10/29/20 11/28/20  Willy Eddy, MD  paliperidone (INVEGA) 9 MG 24 hr tablet Take 1 tablet (9 mg total) by mouth daily. 10/29/20   Willy Eddy, MD  pantoprazole (PROTONIX) 40 MG tablet Take 1 tablet (40 mg total) by mouth daily. 10/29/20   Willy Eddy, MD    Allergies    Chantix [varenicline tartrate], Ativan [lorazepam], Other, Pineapple, and Penicillins  Review of Systems   Review of Systems  Musculoskeletal:  Positive for arthralgias.  All other systems reviewed and are negative.  Physical Exam Updated Vital Signs BP 135/84 (BP Location: Left Arm)   Pulse (!) 134   Temp 98.3 F (36.8 C) (Oral)   Resp 18   SpO2 99%   Physical Exam Vitals and nursing note reviewed.  Constitutional:      Appearance: He is well-developed.  HENT:     Head: Normocephalic and atraumatic.  Eyes:     Conjunctiva/sclera: Conjunctivae normal.     Pupils: Pupils  are equal, round, and reactive to light.  Cardiovascular:     Rate and Rhythm: Normal rate and regular rhythm.     Heart sounds: Normal heart sounds.  Pulmonary:     Effort: Pulmonary effort is normal.     Breath sounds: Normal breath sounds.  Abdominal:     General: Bowel sounds are normal.     Palpations: Abdomen is soft.  Musculoskeletal:        General: Normal range of motion.     Cervical back: Normal range of motion.     Comments: Right knee is grossly normal in appearance without swelling, erythema, or  acute deformity, he is ambulatory up and down hallway with this cane without issue  Skin:    General: Skin is warm and dry.  Neurological:     Mental Status: He is alert and oriented to person, place, and time.    ED Results / Procedures / Treatments   Labs (all labs ordered are listed, but only abnormal results are displayed) Labs Reviewed - No data to display  EKG None  Radiology DG Knee Complete 4 Views Right  Result Date: 11/23/2020 CLINICAL DATA:  Chronic right knee pain. History of right knee replacement. EXAM: RIGHT KNEE - COMPLETE 4+ VIEW COMPARISON:  Plain films right knee 09/24/2020. FINDINGS: The patient has undergone right knee replacement since the prior examination. No hardware complication is identified. No bony or joint abnormality is seen. Soft tissues are negative. IMPRESSION: No acute abnormality or finding to explain the patient's symptoms. Right knee arthroplasty in place. Electronically Signed   By: Drusilla Kanner M.D.   On: 11/23/2020 21:17    Procedures Procedures   Medications Ordered in ED Medications - No data to display  ED Course  I have reviewed the triage vital signs and the nursing notes.  Pertinent labs & imaging results that were available during my care of the patient were reviewed by me and considered in my medical decision making (see chart for details).    MDM Rules/Calculators/A&P  65 year old male here with chronic right  knee pain.  Denies any new injury, yet tells me his knee is "shattered".  He does not have any visible swelling, overlying erythema, or acute deformity of the knee and remains ambulatory with his cane which appears to be baseline.  X-ray is negative.  Patient given discharge papers and became belligerent yelling for social worker.  He has been evaluated by social work in this facility as well as other local hospitals and has yet to follow-up as instructed.  He is currently homeless.  Do not feel that he needs to help here to see [name] in AM, unlikely to change his acute management.  Advised to follow-up with community health and wellness.  Can return here for new/acute changes.  Final Clinical Impression(s) / ED Diagnoses Final diagnoses:  Chronic pain of right knee    Rx / DC Orders ED Discharge Orders     None        Garlon Hatchet, PA-C 11/24/20 [id number]    Benjiman Core, MD 11/24/20 [phone]

## 2020-11-24 NOTE — ED Notes (Signed)
Pt discharged from triage by EDP. Security called for stand by assistance. Pt ambulatory at d/c with all belongings.

## 2020-11-24 NOTE — ED Notes (Signed)
Pt discharged with resources printed on AVS for follow up care. Verbalized understanding. Pt given a bus pass for transport. Safety maintained.

## 2020-11-24 NOTE — BH Assessment (Signed)
Comprehensive Clinical Assessment (CCA) Note  11/24/2020 Dylan Jordan [id number]  Disposition:  Gave clinical report to S. Rankin, NP, who determined that Pt does not meet inpatient criteria and may be discharged.  Pt discharged with bus pass.  The patient demonstrates the following risk factors for suicide: Chronic risk factors for suicide include: psychiatric disorder of Schizoaffective Disorder . Acute risk factors for suicide include: unemployment and possible homelessness . Protective factors for this patient include: hope for the future and religious beliefs against suicide. Considering these factors, the overall suicide risk at this point appears to be low. Patient is appropriate for outpatient follow up.   Flowsheet Row ED from 11/23/2020 in [facility] EMERGENCY DEPARTMENT ED from 11/22/2020 in [facility] EMERGENCY DEPARTMENT ED from 11/15/2020 in [facility]ENCY [facility]PT  C-SSRS RISK CATEGORY Error: Question 6 not populated Error: Q3, 4, or 5 should not be populated when Q2 is No High Risk       Chief Complaint:  Chief Complaint  Patient presents with   Delusional    Pt reported that his name is Dylan Jordan, that he lives at [facility], and that he came in out of concern for his ''visual ship's.''  He emphasized the possessive.     Visit Diagnosis: Schizoaffective Disorder, Bipolar I Type   Narrative:  Pt is a 65 year old male who presented to [facility] on a voluntary basis (transported by police) with apparent altered mental status.  Pt is known to the various EDs and [facility] and has recently been designated as malingering.  Pt has a diagnosis of Schizoaffective Disorder, Bipolar I type, and he stated that has not had medication (or received services from an ACT Team) in about four months.  Pt has no fixed address -- he stated that he lives at [facility] with his father or in [place], [place], and he is unemployed.  Pt has been  assessed five times since the start of June 2022.    Pt was restless in demeanor during assessment.  When asked why he came to the hospital, Pt responded that he was concerned about his ''visual ship's.''  He did not explain what he meant, but insisted that author spell it.  Pt reported that he lives at [facility], that he also lives in [place], [place], that he is not a communist but a devout Muslim.  He also stated that he has not been on his medication for about four months.  Per history, Pt used to receive outpatient psychiatric services through an ACT Team, but he stated that he no longer receives them.    Pt denied suicidal ideation, homicidal ideation, self-injurious behavior, and substance use concerns.  Pt requested food, diet soda, and medication.    During assessment, Pt presented as alert to time, place, situation, and person.  He had fair eye contact and was cooperative.  Demeanor was restless.  Pt was dressed in street clothes, and he carried a duffel bag containing towels, bedding, and several books.  Pt's mood and affect were anxious and preoccupied.  Pt's speech was loud, and he sometimes repeated himself and sang.  Speech organization was tangential.  Pt's thought processes were rapid.  Thought content suggested delusion.  Pt may also experience hallucination.  Pt's memory and concentration could not be adequately assessed, but they seemed poor.  Insight, judgment, and impulse control were poor.  CCA Screening, Triage and Referral (STR)  Patient Reported Information How did  you hear about us? Self  What Is the Reason for Your Visit/Call Today? Pt is a 65 year old male who [facility], with a history of Schizophrenia, stated that he came in because of ''visual ship's.''  Pt stated also that [place], that he and his father live at [facility]  How Long Has This Been Causing You Problems? 1-6 months  What Do You Feel Would Help  You the Most Today? Medication(s)   Have You Recently Had Any Thoughts About Hurting Yourself? No  Are You Planning to Commit Suicide/Harm Yourself At This time? No   Have you Recently Had Thoughts About Hurting Someone Karolee Ohslse? No  Are You Planning to Harm Someone at This Time? No  Explanation: No data recorded  Have You Used Any Alcohol or Drugs in the Past 24 Hours? No  How Long Ago Did You Use Drugs or Alcohol? No data recorded What Did You Use and How Much? No data recorded  Do You Currently Have a Therapist/Psychiatrist? -- (Per chart pt is linked to [facility].)  Name of Therapist/Psychiatrist: No data recorded  Have You Been Recently Discharged From Any Office Practice or Programs? No  Explanation of Discharge From Practice/Program: No data recorded    CCA Screening Triage Referral Assessment Type of Contact: Tele-Assessment  Telemedicine Service Delivery:   Is this Initial or Reassessment? Initial Assessment  Date Telepsych consult ordered in [facility]:  11/14/20  [facility]  [facility]  [facility]  [facility]   Collateral Involvement: Pt respond when clinician asked about collateral involvement.   Does Patient Have a Automotive engineerCourt Appointed Legal Guardian? No data recorded Name and Contact of Legal Guardian: self  If Minor and Not Living with Parent(s), Who has Custody? n/a  Is CPS involved or ever been involved? Never  Is APS involved or ever been involved? Never   Patient Determined To Be At Risk for Harm To Self or Others Based on Review of Patient Reported Information or Presenting Complaint? Yes, for Harm to Others  Method: No Plan  Availability of Means: -- (UTA)  Intent: Vague intent or NA  Notification Required: Identifiable person is aware  Additional Information for Danger to Others Potential: Active psychosis  Additional Comments for Danger to  Others Potential: Pt reports, hearing and seeing things.  Are There Guns or Other Weapons in Your Home? -- (UTA)  Types of Guns/Weapons: No data recorded Are These Weapons Safely Secured?                            No data recorded Who Could Verify You Are Able To Have These Secured: No data recorded Do You Have any Outstanding Charges, Pending Court Dates, Parole/Probation? No data recorded Contacted To Inform of Risk of Harm To Self or Others: Other: Comment (Pt threatened Dr. Jacqulyn BathLong and clinician.)    Does Patient Present under Involuntary Commitment? No  IVC Papers Initial File Date: No data recorded  [place]   Patient Currently Receiving the Following Services: ACTT Psychologist, educational([facility]) (Per chart.)   Determination of Need: Urgent (48 hours)   Options For Referral: Outpatient Therapy; Medication Management     CCA Biopsychosocial Patient Reported Schizophrenia/Schizoaffective Diagnosis in Past: Yes   Strengths: Unknown   Mental Health Symptoms Depression:   Sleep (too much  or little)   Duration of Depressive symptoms:  Duration of Depressive Symptoms: Greater than two weeks   Mania:   None   Anxiety:    None   Psychosis:   Hallucinations   Duration of Psychotic symptoms:  Duration of Psychotic Symptoms: Greater than six months   Trauma:   None   Obsessions:   None   Compulsions:   None   Inattention:   None   Hyperactivity/Impulsivity:   None   Oppositional/Defiant Behaviors:   None   Emotional Irregularity:   None   Other Mood/Personality Symptoms:  No data recorded   Mental Status Exam Appearance and self-care  Stature:   Average   Weight:   Average weight   Clothing:   Casual   Grooming:   Normal   Cosmetic use:   None   Posture/gait:   Normal   Motor activity:   Agitated   Sensorium  Attention:   Distractible   Concentration:   Focuses on irrelevancies   Orientation:    Object; Person; Place; Situation   Recall/memory:   Defective in Immediate; Defective in Short-term   Affect and Mood  Affect:   Labile   Mood:   Pessimistic; Irritable   Relating  Eye contact:   Fleeting   Facial expression:   Tense   Attitude toward examiner:   Dramatic; Irritable   Thought and Language  Speech flow:  Pressured   Thought content:   Suspicious   Preoccupation:   None   Hallucinations:   Auditory; Visual   Organization:  No data recorded  [employer] of Knowledge:   Average   Intelligence:   Average   Abstraction:   Concrete   Judgement:   Poor   Reality Testing:   Distorted   Insight:   Poor   Decision Making:   Impulsive   Social Functioning  Social Maturity:   Impulsive   Social Judgement:   Heedless   Stress  Stressors:   Other (Comment)   Coping Ability:   Normal   Skill Deficits:   None   Supports:   Support needed     Religion:    Leisure/Recreation: Leisure / Recreation Do You Have Hobbies?: No  Exercise/Diet: Exercise/Diet Do You Exercise?: No Have You Gained or Lost A Significant Amount of Weight in the Past Six Months?: No Do You Follow a Special Diet?: No Do You Have Any Trouble Sleeping?: Yes Explanation of Sleeping Difficulties: 'No sleep in several days''   CCA Employment/Education Employment/Work Situation: Employment / Work Situation Employment Situation: Unemployed Patient's Job has Been Impacted by Current Illness: No Has Patient ever Been in Equities trader?: No  Education: Education Is Patient Currently Attending School?: No   CCA Family/Childhood History Family and Relationship History: Family history Marital status: Single Does patient have children?: No  Childhood History:     Child/Adolescent Assessment:     CCA Substance Use Alcohol/Drug Use: Alcohol / Drug Use Pain Medications: Please see MAR Prescriptions: Please see MAr Over the  Counter: Please see MAR History of alcohol / drug use?: No history of alcohol / drug abuse                         ASAM's:  Six Dimensions of Multidimensional Assessment  Dimension 1:  Acute Intoxication and/or Withdrawal Potential:      Dimension 2:  Biomedical Conditions and Complications:      Dimension 3:  Emotional,  Behavioral, or Cognitive Conditions and Complications:     Dimension 4:  Readiness to Change:     Dimension 5:  Relapse, Continued use, or Continued Problem Potential:     Dimension 6:  Recovery/Living Environment:     ASAM Severity Score:    ASAM Recommended Level of Treatment:     Substance use Disorder (SUD)    Recommendations for Services/Supports/Treatments:    Discharge Disposition:    DSM5 Diagnoses: Patient Active Problem List   Diagnosis Date Noted   Malingering 11/15/2020   Homelessness 09/05/2020   Stage 3a chronic kidney disease (HCC) 04/27/2020   Slow transit constipation 04/27/2020   Noncompliance with diabetes treatment 04/20/2020   Uncontrolled type 2 diabetes mellitus with hyperglycemia (HCC) 04/20/2020   Hyperkalemia, diminished renal excretion 03/29/2020   Hyperkalemia    Schizoaffective disorder, bipolar type (HCC) 02/21/2019   Hallucinations    Anxiety 09/18/2018   Swelling of both lower extremities 09/13/2018   Type 2 diabetes mellitus without complication, with long-term current use of insulin (HCC)    Anemia    Hyperlipidemia 12/18/2017   Mixed stress and urge urinary incontinence 11/25/2017   Intermittent chest pain 11/25/2017   Allergic rhinitis with a nonallergic component 07/30/2017   Food allergy 07/30/2017   Chronic gout involving toe of left foot without tophus 05/18/2017   Hemorrhoids 05/04/2017   Chronic low back pain 01/03/2017   Chronic cough 06/30/2016   Drug-induced mood disorder (HCC) 06/14/2016   Type 2 diabetes mellitus with diabetic neuropathy (HCC) 05/26/2016   Iron deficiency anemia 05/26/2016    Chronic kidney disease 05/26/2016   HTN (hypertension) 05/26/2016   Tobacco abuse 05/26/2016   History of substance abuse (HCC) 05/26/2016   History of alcohol abuse 05/26/2016   Mild persistent asthma 05/26/2016     Referrals to Alternative Service(s): Referred to Alternative Service(s):   Place:   Date:   Time:    Referred to Alternative Service(s):   Place:   Date:   Time:    Referred to Alternative Service(s):   Place:   Date:   Time:    Referred to Alternative Service(s):   Place:   Date:   Time:     Earline Mayotte, [facility]

## 2020-11-24 NOTE — ED Provider Notes (Signed)
[facility] Provider Note   CSN: [id number] Arrival date & time: 11/24/20  1923     History Chief Complaint  Patient presents with   Knee Pain    Dylan Jordan is a 65 y.o. male.  HPI  Patient with significant medical history of anemia, asthma, dementia, gout, hypertension, schizophrenia, malingering presents with chief complaint of knee pain, this is going on for a long time, makes it hard for him to walk around.  Also endorses that he is having some with his eyesight issues which has been on for a long time there has been no changes in his eyesight, still at has his baseline.  States he lost his glasses and feels like this is the problem.  After reviewing patient's chart patient been seen multiple times for similar complaints, he was just seen here yesterday for same complaint, imaging was unremarkable, patient was discharged. Past Medical History:  Diagnosis Date   Anemia    Asthma    Diabetes mellitus without complication (HCC)    Gout    Hyperlipidemia    Hyperosmolar hyperglycemic coma due to diabetes mellitus without ketoacidosis (HCC)    Hypertension    Schizophrenia (HCC) 07/06/2018    Patient Active Problem List   Diagnosis Date Noted   Malingering 11/15/2020   Homelessness 09/05/2020   Stage 3a chronic kidney disease (HCC) 04/27/2020   Slow transit constipation 04/27/2020   Noncompliance with diabetes treatment 04/20/2020   Uncontrolled type 2 diabetes mellitus with hyperglycemia (HCC) 04/20/2020   Hyperkalemia, diminished renal excretion 03/29/2020   Hyperkalemia    Schizoaffective disorder, bipolar type (HCC) 02/21/2019   Hallucinations    Anxiety 09/18/2018   Swelling of both lower extremities 09/13/2018   Type 2 diabetes mellitus without complication, with long-term current use of insulin (HCC)    Anemia    Hyperlipidemia 12/18/2017   Mixed stress and urge urinary incontinence 11/25/2017   Intermittent chest pain  11/25/2017   Allergic rhinitis with a nonallergic component 07/30/2017   Food allergy 07/30/2017   Chronic gout involving toe of left foot without tophus 05/18/2017   Hemorrhoids 05/04/2017   Chronic low back pain 01/03/2017   Chronic cough 06/30/2016   Drug-induced mood disorder (HCC) 06/14/2016   Type 2 diabetes mellitus with diabetic neuropathy (HCC) 05/26/2016   Iron deficiency anemia 05/26/2016   Chronic kidney disease 05/26/2016   HTN (hypertension) 05/26/2016   Tobacco abuse 05/26/2016   History of substance abuse (HCC) 05/26/2016   History of alcohol abuse 05/26/2016   Mild persistent asthma 05/26/2016    Past Surgical History:  Procedure Laterality Date   REPLACEMENT TOTAL KNEE Left    TOE SURGERY         Family History  Adopted: Yes  Problem Relation Age of Onset   Allergic rhinitis Neg Hx    Angioedema Neg Hx    Asthma Neg Hx    Atopy Neg Hx    Eczema Neg Hx    Immunodeficiency Neg Hx    Urticaria Neg Hx     Social History   Tobacco Use   Smoking status: Some Days    Packs/day: 0.30    Years: 45.00    Pack years: 13.50    Types: Cigarettes    Start date: 05/16/1971   Smokeless tobacco: Never   Tobacco comments:    Decreased Intake to 10 cigarettes per week or less.  Vaping Use   Vaping Use: Never used  Substance Use Topics  Alcohol use: No   Drug use: No    Home Medications Prior to Admission medications   Medication Sig Start Date End Date Taking? Authorizing Provider  albuterol (VENTOLIN HFA) 108 (90 Base) MCG/ACT inhaler Inhale 1-2 puffs into the lungs every 4 (four) hours as needed for wheezing or shortness of breath. 10/29/20   Willy Eddy, MD  amLODipine (NORVASC) 10 MG tablet Take 1 tablet (10 mg total) by mouth daily. 10/29/20   Willy Eddy, MD  atorvastatin (LIPITOR) 20 MG tablet Take 1 tablet (20 mg total) by mouth at bedtime. 10/29/20 12/28/20  Willy Eddy, MD  benztropine (COGENTIN) 1 MG tablet Take 1 tablet (1 mg  total) by mouth 2 (two) times daily. 10/29/20   Willy Eddy, MD  desmopressin (DDAVP) 0.2 MG tablet Take 1 tablet (0.2 mg total) by mouth at bedtime. 10/29/20   Willy Eddy, MD  ferrous sulfate 325 (65 FE) MG tablet Take 1 tablet (325 mg total) by mouth every other day. 10/29/20   Willy Eddy, MD  gabapentin (NEURONTIN) 300 MG capsule Take 2 capsules (600 mg total) by mouth 3 (three) times daily. 10/29/20 11/28/20  Willy Eddy, MD  insulin aspart (NOVOLOG) 100 UNIT/ML injection Inject 9 Units into the skin 3 (three) times daily with meals. 10/29/20   Willy Eddy, MD  insulin glargine (LANTUS) 100 UNIT/ML injection Inject 0.4 mLs (40 Units total) into the skin daily. 10/29/20   Willy Eddy, MD  lisinopril (ZESTRIL) 10 MG tablet Take 1 tablet (10 mg total) by mouth daily. 10/29/20 11/28/20  Willy Eddy, MD  Melatonin 10 MG TABS Take 10 mg by mouth at bedtime. 10/29/20   Willy Eddy, MD  metformin (FORTAMET) 1000 MG (OSM) 24 hr tablet Take 1 tablet (1,000 mg total) by mouth daily with breakfast. 10/29/20   Willy Eddy, MD  nicotine (NICODERM CQ - DOSED IN MG/24 HOURS) 21 mg/24hr patch Place 1 patch (21 mg total) onto the skin daily. 10/29/20   Willy Eddy, MD  OLANZapine (ZYPREXA) 10 MG tablet Take 1 tablet (10 mg total) by mouth in the morning and at bedtime. 10/29/20 11/28/20  Willy Eddy, MD  paliperidone (INVEGA) 9 MG 24 hr tablet Take 1 tablet (9 mg total) by mouth daily. 10/29/20   Willy Eddy, MD  pantoprazole (PROTONIX) 40 MG tablet Take 1 tablet (40 mg total) by mouth daily. 10/29/20   Willy Eddy, MD    Allergies    Chantix [varenicline tartrate], Ativan [lorazepam], Other, Pineapple, and Penicillins  Review of Systems   Review of Systems  Constitutional:  Negative for chills and fever.  HENT:  Negative for congestion.   Eyes:  Positive for visual disturbance.  Respiratory:  Negative for shortness of breath.    Cardiovascular:  Negative for chest pain.  Gastrointestinal:  Negative for abdominal pain.  Genitourinary:  Negative for enuresis.  Musculoskeletal:  Negative for back pain.       Knee pain   Skin:  Negative for rash.  Neurological:  Negative for dizziness.  Hematological:  Does not bruise/bleed easily.   Physical Exam Updated Vital Signs BP (!) 152/82 (BP Location: Left Arm)   Pulse 74   Temp 98.2 F (36.8 C) (Oral)   Resp 17   Ht 6\' 2"  (1.88 m)   Wt 98 kg   SpO2 99%   BMI 27.73 kg/m   Physical Exam Vitals and nursing note reviewed.  Constitutional:      General: He is not in acute distress.  Appearance: He is not ill-appearing.  HENT:     Head: Normocephalic and atraumatic.     Nose: No congestion.  Eyes:     Extraocular Movements: Extraocular movements intact.     Conjunctiva/sclera: Conjunctivae normal.     Pupils: Pupils are equal, round, and reactive to light.  Cardiovascular:     Rate and Rhythm: Normal rate and regular rhythm.     Pulses: Normal pulses.     Heart sounds: No murmur heard.   No friction rub. No gallop.  Pulmonary:     Effort: Pulmonary effort is normal.  Musculoskeletal:     Comments: Patient was able to move lower extremities out difficulty.  Skin:    General: Skin is warm and dry.  Neurological:     Mental Status: He is alert.  Psychiatric:        Mood and Affect: Mood normal.    ED Results / Procedures / Treatments   Labs (all labs ordered are listed, but only abnormal results are displayed) Labs Reviewed - No data to display  EKG None  Radiology DG Knee Complete 4 Views Right  Result Date: 11/23/2020 CLINICAL DATA:  Chronic right knee pain. History of right knee replacement. EXAM: RIGHT KNEE - COMPLETE 4+ VIEW COMPARISON:  Plain films right knee 09/24/2020. FINDINGS: The patient has undergone right knee replacement since the prior examination. No hardware complication is identified. No bony or joint abnormality is seen. Soft  tissues are negative. IMPRESSION: No acute abnormality or finding to explain the patient's symptoms. Right knee arthroplasty in place. Electronically Signed   By: Drusilla Kanner M.D.   On: 11/23/2020 21:17    Procedures Procedures   Medications Ordered in ED Medications - No data to display  ED Course  I have reviewed the triage vital signs and the nursing notes.  Pertinent labs & imaging results that were available during my care of the patient were reviewed by me and considered in my medical decision making (see chart for details).    MDM Rules/Calculators/A&P                         Initial impression-patient presents with knee pain and eyesight.  He is alert, does not appear in distress, vital signs reassuring.  Work-up-due to well-appearing patient, benign for exam, further lab or imaging ordered at this time.  Rule out- I have low suspicion for septic arthritis as patient denies IV drug use, skin exam was performed no erythematous, edematous, warm joints noted on exam, no new heart murmur heard on exam.  Low suspicion for fracture or dislocation as x-ray does not feel any significant findings. low suspicion for ligament or tendon damage as area was palpated no gross defects noted, they had full range of motion as well as 5/5 strength.  Low suspicion for compartment syndrome as area was palpated it was soft to the touch, neurovascular fully intact.  I have low suspicion for acute eye abnormality as he denies  eye pain, there is no drainage or discharge present my exam, patient states eyesight is at its baseline.  Suspect this is more a chronic issue.   Plan-  Knee pain-suspect this is an acute on chronic issue, will have him follow-up with his PCP for further evaluation. Eyesight-suspect acute on chronic we will have him follow-up with ophthalmology for evaluation.  Vital signs have remained stable, no indication for hospital admission.  Patient discussed with attending and they  agreed  with assessment and plan.  Patient given at home care as well strict return precautions.  Patient verbalized that they understood agreed to said plan.  Final Clinical Impression(s) / ED Diagnoses Final diagnoses:  Chronic pain of both knees    Rx / DC Orders ED Discharge Orders     None        Barnie Del 11/24/20 2039    Derwood Kaplan, MD 11/24/20 2307

## 2020-11-24 NOTE — Discharge Instructions (Addendum)
Please follow-up with [facility] health and wellness for further evaluation of your knee pain.  Please follow-up with ophthalmology for your eye issues.  Come back to the emergency department if you develop chest pain, shortness of breath, severe abdominal pain, uncontrolled nausea, vomiting, diarrhea.

## 2020-11-24 NOTE — ED Notes (Addendum)
Pt awoke from sleeping, stated "I'm going to find my dick" and began walking towards department exit.RN attempted to inform patient of risks of leaving prior to MD discharge.

## 2020-11-24 NOTE — ED Notes (Addendum)
Pt reports he hasn't taken his home/psych medications in 4 months. Pt reports he is famished.

## 2020-11-24 NOTE — ED Notes (Signed)
Pt left bed and came back to registration. Pt taken back to H021.

## 2020-11-24 NOTE — ED Notes (Signed)
Pt continuously keep requesting immediate care. Pt is raising his voice. Explained to pt wait time.

## 2020-11-24 NOTE — ED Provider Notes (Signed)
Behavioral Health Urgent Care Medical Screening Exam  Patient Name: Dylan Jordan MRN: [id number] Date of Evaluation: 11/24/20 Chief Complaint:   Diagnosis:  Final diagnoses:  Malingering  Homelessness  Schizoaffective disorder, bipolar type (HCC)    History of Present illness: Dylan Jordan is a 65 y.o. male patient presented to [facility] as a walk in via [place] [employer] with complaints of "seeing ships"  Dylan Jordan, 65 y.o., male patient seen face to face by this provider, consulted with Dr. Earlene Plater; and chart reviewed on 11/24/20.  On evaluation Dylan Jordan asked what happened for him to be brought to urgent care and he states "I told them I was seeing ships"  Patient asked if there was anything he needed he stated "Something to eat.  No pork and lots of Coke."  Patient asked if he was having any thoughts of wanting to kill himself or anyone else he stated "I don't want to kill myself and nobody else either, and I ain't paranoid."  Patient asked what I could help him with and he stated "I want to go to [facility]."  Patient informed that he would not be sent to [facility] and if there was anything else he could be helped with He stated "Something to eat; and go to [facility]."  Patient asked out his outpatient psychiatric services "I don't have any, don't need any."  Patient informed that the [employer] was willing to take him where he needed to go but would not take him to [facility]  Patient stated again "[facility]" and laughed.   During evaluation Dylan Jordan is sitting in chair folding clothing and putting papers in plastic bad.  He is in no acute distress.  He is alert, oriented x 3, calm, cooperative; and his mood is euthymic with congruent affect.  He/She does not appear to be responding to internal/external stimuli or delusional thoughts; but continues to state he is seeing ships.  Patient denies suicidal/self-harm/homicidal ideation, and paranoia.    Patient has had multiple urgent care and  ED visits with similar complaints of hearing voices or seeing things.  Usually, will not describe what he is hearing or seeing.  Today he states he is seeing ships.  Often denies suicidal/homicidal ideation.  Patient is typically demanding; getting agitated or irritable when he doesn't get his way.  Patient is well known for going to ED to ED on the same day.  Patient discharged from ED on 11/22/20.  Patient has ACTT team with [facility] but denies that he knows what that is.   There is no evidence of a psychiatric condition which is amendable to inpatient psychiatric hospitalization.  Patients' homicidal ideation and psychosis appears to be conditional and is a means of manipulation and attention seeking without a true intention to harm himself or others.  His behavior is more consistent with someone who is acting in self-preservation, rather than someone who is acutely suicidal/homicidal or psychotic.  Given all that is stated above and including the fact that the patient shows no signs of mania or psychosis and has a liner, coherent thought process throughout the interview today, the patient does not meet criteria for inpatient psychiatric treatment or further psychiatric evaluation at this time.  Patient would benefit from outpatient treatment and following up with his ACTT team.  Patient was also given resources for outpatient psychiatric services, shelters, and community services.      Psychiatric Specialty Exam  Presentation  General Appearance:Appropriate for Environment  Eye Contact:Minimal  Speech:Clear and  Coherent; Normal Rate  Speech Volume:Normal  Handedness:Right   Mood and Affect  Mood:Euthymic  Affect:Congruent   Thought Process  Thought Processes:Linear  Descriptions of Associations:Intact  Orientation:Full (Time, Place and Person)  Thought Content:WDL  Diagnosis of Schizophrenia or Schizoaffective disorder in past: Yes  Duration of Psychotic Symptoms:  Greater than six months  Hallucinations:Visual Patient unable to describe what voices are saying.  Patient calm, and doesn't appear to be responding to internal or external stimuli I told them I was seeing ships "[address], [employer], [address], [employer], [employer] [address]"  Ideas of Reference:None  Suicidal Thoughts:No  Homicidal Thoughts:No   Sensorium  Memory:Other (comment) (Unable to assess patient will not answer questions)  Judgment:Intact  Insight:Shallow   Executive Functions  Concentration:Good  Attention Span:Good  Recall:Good  Fund of Knowledge:Fair  Language:Good   Psychomotor Activity  Psychomotor Activity:Normal   Assets  Assets:Communication Skills; Resilience; Desire for Improvement   Sleep  Sleep:Good  Number of hours: 6   Nutritional Assessment (For OBS and FBC admissions only) Has the patient had a weight loss or gain of 10 pounds or more in the last 3 months?: No Has the patient had a decrease in food intake/or appetite?: No Does the patient have dental problems?: No Does the patient have eating habits or behaviors that may be indicators of an eating disorder including binging or inducing vomiting?: No Has the patient been eating poorly because of a decreased appetite?: No    Physical Exam: Physical Exam Vitals and nursing note reviewed. Exam conducted with a chaperone present.  Constitutional:      General: He is not in acute distress.    Appearance: Normal appearance. He is not ill-appearing.  Cardiovascular:     Rate and Rhythm: Normal rate.  Pulmonary:     Effort: Pulmonary effort is normal.  Musculoskeletal:        General: Normal range of motion.     Cervical back: Normal range of motion.  Skin:    General: Skin is warm and dry.  Neurological:     Mental Status: He is alert and oriented to person, place, and time.  Psychiatric:        Attention and Perception: Attention normal. He perceives visual hallucinations.         Mood and Affect: Mood and affect normal.        Speech: Speech normal.        Behavior: Behavior normal. Behavior is cooperative.        Thought Content: Thought content normal. Thought content is not paranoid or delusional. Thought content does not include homicidal or suicidal ideation.        Cognition and Memory: Cognition normal.        Judgment: Judgment is impulsive.   Review of Systems  Unable to perform ROS: Other (Would not answer all questions)  Psychiatric/Behavioral:  Depression: Stable. Hallucinations: Patient states "I told them I was seeing ships". Suicidal ideas: States "I don't want to kill myself and nobody else either". The patient is not nervous/anxious and does not have insomnia.        Patient states he was brought to urgent care because "I told them I was seeing ships.  Patient will not answer all question.  He is folding up his clothing, and putting papers in a plastic bag "It's go to be neat."  Stating putting papers in plastic bag "so they don't get wet."    There were no vitals taken for  this visit. There is no height or weight on file to calculate BMI.  Musculoskeletal: Strength & Muscle Tone: within normal limits Gait & Station: normal Patient leans: N/A   [facility] MSE Discharge Disposition for Follow up and Recommendations: Based on my evaluation the patient does not appear to have an emergency medical condition and can be discharged with resources and follow up care in outpatient services for Medication Management, Individual Therapy, Group Therapy, and Follow up with ACTT team and resources given.  Follow-up Information     Call  [facility], [employer].   Specialty: Professional Counselor Why: schedule an appointment for medication management and therapy Contact information: [employer] [facility] [place] [address] [place] [address] [phone]         Go to  [facility].   Specialty: Urgent  Care Why: Open Access:  Monday - Thursday from 8 am to 11 am for medication management and therapy intake.  On Friday from 1 pm to 4 pm for therapy intake only Contact information: [address] [place] [address] [phone]        [facility].   Why: Walk in hours Monday thru Thursday 8:00 AM to 3:00 PM first come first serve Contact information: [address]  [address] [place] [address] [phone]                Patient also given handouts with resources   Jadakiss Barish, NP 11/24/2020, 5:08 PM

## 2020-11-25 ENCOUNTER — Emergency Department (HOSPITAL_COMMUNITY): Payer: Medicaid Other

## 2020-11-25 ENCOUNTER — Emergency Department (HOSPITAL_COMMUNITY)
Admission: EM | Admit: 2020-11-25 | Discharge: 2020-11-25 | Disposition: A | Discharge status: Home / Self Care | Payer: Medicaid Other | Attending: Emergency Medicine | Admitting: Emergency Medicine

## 2020-11-25 DIAGNOSIS — I129 Hypertensive chronic kidney disease with stage 1 through stage 4 chronic kidney disease, or unspecified chronic kidney disease: Secondary | ICD-10-CM | POA: Insufficient documentation

## 2020-11-25 DIAGNOSIS — J4530 Mild persistent asthma, uncomplicated: Secondary | ICD-10-CM | POA: Insufficient documentation

## 2020-11-25 DIAGNOSIS — Z794 Long term (current) use of insulin: Secondary | ICD-10-CM | POA: Diagnosis not present

## 2020-11-25 DIAGNOSIS — E1122 Type 2 diabetes mellitus with diabetic chronic kidney disease: Secondary | ICD-10-CM | POA: Insufficient documentation

## 2020-11-25 DIAGNOSIS — M79671 Pain in right foot: Secondary | ICD-10-CM

## 2020-11-25 DIAGNOSIS — Z7984 Long term (current) use of oral hypoglycemic drugs: Secondary | ICD-10-CM | POA: Diagnosis not present

## 2020-11-25 DIAGNOSIS — M79672 Pain in left foot: Secondary | ICD-10-CM | POA: Insufficient documentation

## 2020-11-25 DIAGNOSIS — N1831 Chronic kidney disease, stage 3a: Secondary | ICD-10-CM | POA: Diagnosis not present

## 2020-11-25 DIAGNOSIS — Z79899 Other long term (current) drug therapy: Secondary | ICD-10-CM | POA: Insufficient documentation

## 2020-11-25 DIAGNOSIS — Z96652 Presence of left artificial knee joint: Secondary | ICD-10-CM | POA: Diagnosis not present

## 2020-11-25 DIAGNOSIS — F17210 Nicotine dependence, cigarettes, uncomplicated: Secondary | ICD-10-CM | POA: Insufficient documentation

## 2020-11-25 LAB — CBG MONITORING, ED: Glucose-Capillary: 124 mg/dL — ABNORMAL HIGH (ref 70–99)

## 2020-11-25 NOTE — ED Provider Notes (Signed)
Emergency Medicine Provider Triage Evaluation Note  Dylan Jordan , a 65 y.o. male  was evaluated in triage.  Pt complains of bilateral foot pain for "awhile". Pain worse with ambulation. No fever or chills. He has been seen for the same numerous times.   Review of Systems  Positive: arthralgia Negative: fever  Physical Exam  BP 134/86   Pulse 82   Temp 98.8 F (37.1 C) (Oral)   Resp 14   SpO2 99%  Gen:   Awake, no distress   Resp:  Normal effort  MSK:   Moves extremities without difficulty  Other:    Medical Decision Making  Medically screening exam initiated at 6:25 PM.  Appropriate orders placed.  Dylan Jordan was informed that the remainder of the evaluation will be completed by another provider, this initial triage assessment does not replace that evaluation, and the importance of remaining in the ED until their evaluation is complete.  It appears patient has not had any recent x-rays. X-rays ordered. Full ROM of toes and ankle. Low suspicion for septic joint.    Dylan Jordan 11/25/20 Dylan Jordan    Dylan Dibbles, MD 11/28/20 [phone]

## 2020-11-25 NOTE — ED Triage Notes (Signed)
Pt c/o bilateral foot/leg swelling/pain, states "for awhile." 10/10 pain, ambulatory in traige/lobby. Seen frequently for same

## 2020-11-25 NOTE — ED Notes (Signed)
Pt given discharge paperwork, sandwich bag, a drink, and a bus pass.

## 2020-11-25 NOTE — ED Provider Notes (Signed)
[facility] EMERGENCY DEPARTMENT Provider Note   CSN: [id number] Arrival date & time: 11/25/20  1804     History Chief Complaint  Patient presents with   Foot Pain   Leg Swelling    Dylan Jordan is a 65 y.o. male with a past medical history of diabetes, hypertension, schizophrenia presenting to the ED for bilateral foot pain.  This has been going on for several months.  Also wanting to get his blood sugar checked.  Denies any injuries or falls.  No fevers or numbness  HPI     Past Medical History:  Diagnosis Date   Anemia    Asthma    Diabetes mellitus without complication (HCC)    Gout    Hyperlipidemia    Hyperosmolar hyperglycemic coma due to diabetes mellitus without ketoacidosis (HCC)    Hypertension    Schizophrenia (HCC) 07/06/2018    Patient Active Problem List   Diagnosis Date Noted   Malingering 11/15/2020   Homelessness 09/05/2020   Stage 3a chronic kidney disease (HCC) 04/27/2020   Slow transit constipation 04/27/2020   Noncompliance with diabetes treatment 04/20/2020   Uncontrolled type 2 diabetes mellitus with hyperglycemia (HCC) 04/20/2020   Hyperkalemia, diminished renal excretion 03/29/2020   Hyperkalemia    Schizoaffective disorder, bipolar type (HCC) 02/21/2019   Hallucinations    Anxiety 09/18/2018   Swelling of both lower extremities 09/13/2018   Type 2 diabetes mellitus without complication, with long-term current use of insulin (HCC)    Anemia    Hyperlipidemia 12/18/2017   Mixed stress and urge urinary incontinence 11/25/2017   Intermittent chest pain 11/25/2017   Allergic rhinitis with a nonallergic component 07/30/2017   Food allergy 07/30/2017   Chronic gout involving toe of left foot without tophus 05/18/2017   Hemorrhoids 05/04/2017   Chronic low back pain 01/03/2017   Chronic cough 06/30/2016   Drug-induced mood disorder (HCC) 06/14/2016   Type 2 diabetes mellitus with diabetic neuropathy (HCC) 05/26/2016    Iron deficiency anemia 05/26/2016   Chronic kidney disease 05/26/2016   HTN (hypertension) 05/26/2016   Tobacco abuse 05/26/2016   History of substance abuse (HCC) 05/26/2016   History of alcohol abuse 05/26/2016   Mild persistent asthma 05/26/2016    Past Surgical History:  Procedure Laterality Date   REPLACEMENT TOTAL KNEE Left    TOE SURGERY         Family History  Adopted: Yes  Problem Relation Age of Onset   Allergic rhinitis Neg Hx    Angioedema Neg Hx    Asthma Neg Hx    Atopy Neg Hx    Eczema Neg Hx    Immunodeficiency Neg Hx    Urticaria Neg Hx     Social History   Tobacco Use   Smoking status: Some Days    Packs/day: 0.30    Years: 45.00    Pack years: 13.50    Types: Cigarettes    Start date: 05/16/1971   Smokeless tobacco: Never   Tobacco comments:    Decreased Intake to 10 cigarettes per week or less.  Vaping Use   Vaping Use: Never used  Substance Use Topics   Alcohol use: No   Drug use: No    Home Medications Prior to Admission medications   Medication Sig Start Date End Date Taking? Authorizing Provider  albuterol (VENTOLIN HFA) 108 (90 Base) MCG/ACT inhaler Inhale 1-2 puffs into the lungs every 4 (four) hours as needed for wheezing or shortness of breath. 10/29/20  Willy Eddy, MD  amLODipine (NORVASC) 10 MG tablet Take 1 tablet (10 mg total) by mouth daily. 10/29/20   Willy Eddy, MD  atorvastatin (LIPITOR) 20 MG tablet Take 1 tablet (20 mg total) by mouth at bedtime. 10/29/20 12/28/20  Willy Eddy, MD  benztropine (COGENTIN) 1 MG tablet Take 1 tablet (1 mg total) by mouth 2 (two) times daily. 10/29/20   Willy Eddy, MD  desmopressin (DDAVP) 0.2 MG tablet Take 1 tablet (0.2 mg total) by mouth at bedtime. 10/29/20   Willy Eddy, MD  ferrous sulfate 325 (65 FE) MG tablet Take 1 tablet (325 mg total) by mouth every other day. 10/29/20   Willy Eddy, MD  gabapentin (NEURONTIN) 300 MG capsule Take 2 capsules (600 mg  total) by mouth 3 (three) times daily. 10/29/20 11/28/20  Willy Eddy, MD  insulin aspart (NOVOLOG) 100 UNIT/ML injection Inject 9 Units into the skin 3 (three) times daily with meals. 10/29/20   Willy Eddy, MD  insulin glargine (LANTUS) 100 UNIT/ML injection Inject 0.4 mLs (40 Units total) into the skin daily. 10/29/20   Willy Eddy, MD  lisinopril (ZESTRIL) 10 MG tablet Take 1 tablet (10 mg total) by mouth daily. 10/29/20 11/28/20  Willy Eddy, MD  Melatonin 10 MG TABS Take 10 mg by mouth at bedtime. 10/29/20   Willy Eddy, MD  metformin (FORTAMET) 1000 MG (OSM) 24 hr tablet Take 1 tablet (1,000 mg total) by mouth daily with breakfast. 10/29/20   Willy Eddy, MD  nicotine (NICODERM CQ - DOSED IN MG/24 HOURS) 21 mg/24hr patch Place 1 patch (21 mg total) onto the skin daily. 10/29/20   Willy Eddy, MD  OLANZapine (ZYPREXA) 10 MG tablet Take 1 tablet (10 mg total) by mouth in the morning and at bedtime. 10/29/20 11/28/20  Willy Eddy, MD  paliperidone (INVEGA) 9 MG 24 hr tablet Take 1 tablet (9 mg total) by mouth daily. 10/29/20   Willy Eddy, MD  pantoprazole (PROTONIX) 40 MG tablet Take 1 tablet (40 mg total) by mouth daily. 10/29/20   Willy Eddy, MD    Allergies    Chantix [varenicline tartrate], Ativan [lorazepam], Other, Pineapple, and Penicillins  Review of Systems   Review of Systems  Constitutional:  Negative for chills and fever.  Musculoskeletal:  Positive for arthralgias and myalgias.  Neurological:  Negative for numbness.   Physical Exam Updated Vital Signs BP 134/86   Pulse 82   Temp 98.8 F (37.1 C) (Oral)   Resp 14   SpO2 99%   Physical Exam Vitals and nursing note reviewed.  Constitutional:      General: He is not in acute distress.    Appearance: He is well-developed. He is not diaphoretic.  HENT:     Head: Normocephalic and atraumatic.  Eyes:     General: No scleral icterus.    Conjunctiva/sclera: Conjunctivae  normal.  Pulmonary:     Effort: Pulmonary effort is normal. No respiratory distress.  Musculoskeletal:     Cervical back: Normal range of motion.     Comments: 2+ DP pulse palpated bilaterally.  Normal range of motion of bilateral ankles and digits.  No overlying skin changes.  No erythema, edema or warmth of joint.  Skin:    Findings: No rash.  Neurological:     Mental Status: He is alert.    ED Results / Procedures / Treatments   Labs (all labs ordered are listed, but only abnormal results are displayed) Labs Reviewed  CBG MONITORING, ED - Abnormal; Notable  for the following components:      Result Value   Glucose-Capillary 124 (*)    All other components within normal limits    EKG None  Radiology DG Foot Complete Left  Result Date: 11/25/2020 CLINICAL DATA:  Chronic foot pain EXAM: LEFT FOOT - COMPLETE 3+ VIEW COMPARISON:  None. FINDINGS: There is no evidence of fracture or dislocation. Pes planus and hallux valgus. Mild degenerative change of the dorsal midfoot and first digit . Soft tissues are unremarkable. IMPRESSION: No acute osseous abnormality. Mild degenerative change of the dorsal midfoot and first digit. Pes planus and hallux valgus. Electronically Signed   By: Maudry Mayhew MD   On: 11/25/2020 19:40   DG Foot Complete Right  Result Date: 11/25/2020 CLINICAL DATA:  Chronic right greater than left foot pain, no known injury EXAM: RIGHT FOOT COMPLETE - 3+ VIEW COMPARISON:  April 17, 2017 FINDINGS: There is no evidence of fracture or dislocation. Pes planus and hallux valgus. Mild first digit and dorsal midfoot degenerative change. Soft tissues are unremarkable. IMPRESSION: No acute osseous abnormality. Mild degenerative change of the dorsal midfoot and first digit. Pes planus and hallux valgus. Electronically Signed   By: Maudry Mayhew MD   On: 11/25/2020 19:35    Procedures Procedures   Medications Ordered in ED Medications - No data to display  ED Course  I  have reviewed the triage vital signs and the nursing notes.  Pertinent labs & imaging results that were available during my care of the patient were reviewed by me and considered in my medical decision making (see chart for details).  Clinical Course as of 11/25/20 2224  Thu Nov 25, 2020  2223 Glucose-Capillary(!): 124 [HK]    Clinical Course User Index [HK] Dietrich Pates, [place]   MDM Rules/Calculators/A&P                          65 year old male presenting to the ED for bilateral foot pain.  This has been going on for several months.  No injury or trauma.  No overlying skin changes of the joint that would concerning for infectious cause.  Equal and intact distal pulses bilaterally.  X-rays of bilateral feet ordered in triage are negative for acute abnormality.  He does have degenerative changes which could be the cause of chronic pain.  CBG checked 124.  Will discharge with Tylenol for pain.  Return precautions given.   Patient is hemodynamically stable, in NAD, and able to ambulate in the ED. Evaluation does not show pathology that would require ongoing emergent intervention or inpatient treatment. I explained the diagnosis to the patient. Pain has been managed and has no complaints prior to discharge. Patient is comfortable with above plan and is stable for discharge at this time. All questions were answered prior to disposition. Strict return precautions for returning to the ED were discussed. Encouraged follow up with PCP.   An After Visit Summary was printed and given to the patient.   Portions of this note were generated with Scientist, clinical (histocompatibility and immunogenetics). Dictation errors may occur despite best attempts at proofreading.  Final Clinical Impression(s) / ED Diagnoses Final diagnoses:  Bilateral foot pain    Rx / DC Orders ED Discharge Orders     None        Dietrich Pates, PA-C 11/25/20 2224    Milagros Loll, MD 11/26/20 1202

## 2020-11-25 NOTE — Discharge Instructions (Addendum)
Take Tylenol as needed for pain. Continue your other home medications as previously prescribed. Return to the ER if you start to experience chest pain, shortness of breath, increased leg swelling, injuries or falls

## 2020-11-26 ENCOUNTER — Encounter (HOSPITAL_COMMUNITY): Payer: Self-pay | Admitting: Emergency Medicine

## 2020-11-26 ENCOUNTER — Other Ambulatory Visit: Payer: Self-pay

## 2020-11-26 ENCOUNTER — Emergency Department (HOSPITAL_COMMUNITY): Admission: EM | Admit: 2020-11-26 | Payer: Medicaid Other | Source: Home / Self Care

## 2020-11-26 ENCOUNTER — Emergency Department (HOSPITAL_COMMUNITY)
Admission: EM | Admit: 2020-11-26 | Discharge: 2020-11-27 | Disposition: A | Discharge status: Home / Self Care | Payer: Medicaid Other | Attending: Emergency Medicine | Admitting: Emergency Medicine

## 2020-11-26 DIAGNOSIS — Z794 Long term (current) use of insulin: Secondary | ICD-10-CM | POA: Insufficient documentation

## 2020-11-26 DIAGNOSIS — R45851 Suicidal ideations: Secondary | ICD-10-CM | POA: Diagnosis not present

## 2020-11-26 DIAGNOSIS — Z96652 Presence of left artificial knee joint: Secondary | ICD-10-CM | POA: Diagnosis not present

## 2020-11-26 DIAGNOSIS — R441 Visual hallucinations: Secondary | ICD-10-CM | POA: Diagnosis not present

## 2020-11-26 DIAGNOSIS — J45909 Unspecified asthma, uncomplicated: Secondary | ICD-10-CM | POA: Insufficient documentation

## 2020-11-26 DIAGNOSIS — M79671 Pain in right foot: Secondary | ICD-10-CM | POA: Diagnosis not present

## 2020-11-26 DIAGNOSIS — F17210 Nicotine dependence, cigarettes, uncomplicated: Secondary | ICD-10-CM | POA: Diagnosis not present

## 2020-11-26 DIAGNOSIS — E1122 Type 2 diabetes mellitus with diabetic chronic kidney disease: Secondary | ICD-10-CM | POA: Insufficient documentation

## 2020-11-26 DIAGNOSIS — J4530 Mild persistent asthma, uncomplicated: Secondary | ICD-10-CM | POA: Insufficient documentation

## 2020-11-26 DIAGNOSIS — I129 Hypertensive chronic kidney disease with stage 1 through stage 4 chronic kidney disease, or unspecified chronic kidney disease: Secondary | ICD-10-CM | POA: Insufficient documentation

## 2020-11-26 DIAGNOSIS — N1831 Chronic kidney disease, stage 3a: Secondary | ICD-10-CM | POA: Diagnosis not present

## 2020-11-26 DIAGNOSIS — Y900 Blood alcohol level of less than 20 mg/100 ml: Secondary | ICD-10-CM | POA: Diagnosis not present

## 2020-11-26 DIAGNOSIS — Z20822 Contact with and (suspected) exposure to covid-19: Secondary | ICD-10-CM | POA: Diagnosis not present

## 2020-11-26 DIAGNOSIS — M79672 Pain in left foot: Secondary | ICD-10-CM | POA: Diagnosis not present

## 2020-11-26 DIAGNOSIS — Z7984 Long term (current) use of oral hypoglycemic drugs: Secondary | ICD-10-CM | POA: Insufficient documentation

## 2020-11-26 DIAGNOSIS — R4689 Other symptoms and signs involving appearance and behavior: Secondary | ICD-10-CM

## 2020-11-26 DIAGNOSIS — Z5900 Homelessness unspecified: Secondary | ICD-10-CM

## 2020-11-26 DIAGNOSIS — F250 Schizoaffective disorder, bipolar type: Secondary | ICD-10-CM | POA: Diagnosis not present

## 2020-11-26 DIAGNOSIS — R45850 Homicidal ideations: Secondary | ICD-10-CM | POA: Diagnosis not present

## 2020-11-26 DIAGNOSIS — Z79899 Other long term (current) drug therapy: Secondary | ICD-10-CM | POA: Insufficient documentation

## 2020-11-26 DIAGNOSIS — E1140 Type 2 diabetes mellitus with diabetic neuropathy, unspecified: Secondary | ICD-10-CM | POA: Diagnosis not present

## 2020-11-26 DIAGNOSIS — R440 Auditory hallucinations: Secondary | ICD-10-CM | POA: Insufficient documentation

## 2020-11-26 LAB — CBC WITH DIFFERENTIAL/PLATELET
Abs Immature Granulocytes: 0.02 10*3/uL (ref 0.00–0.07)
Basophils Absolute: 0 10*3/uL (ref 0.0–0.1)
Basophils Relative: 0 %
Eosinophils Absolute: 0.1 10*3/uL (ref 0.0–0.5)
Eosinophils Relative: 2 %
HCT: 34.6 % — ABNORMAL LOW (ref 39.0–52.0)
Hemoglobin: 11.1 g/dL — ABNORMAL LOW (ref 13.0–17.0)
Immature Granulocytes: 0 %
Lymphocytes Relative: 34 %
Lymphs Abs: 2.2 10*3/uL (ref 0.7–4.0)
MCH: 27.5 pg (ref 26.0–34.0)
MCHC: 32.1 g/dL (ref 30.0–36.0)
MCV: 85.6 fL (ref 80.0–100.0)
Monocytes Absolute: 0.7 10*3/uL (ref 0.1–1.0)
Monocytes Relative: 10 %
Neutro Abs: 3.4 10*3/uL (ref 1.7–7.7)
Neutrophils Relative %: 54 %
Platelets: 352 10*3/uL (ref 150–400)
RBC: 4.04 MIL/uL — ABNORMAL LOW (ref 4.22–5.81)
RDW: 14 % (ref 11.5–15.5)
WBC: 6.5 10*3/uL (ref 4.0–10.5)
nRBC: 0 % (ref 0.0–0.2)

## 2020-11-26 LAB — COMPREHENSIVE METABOLIC PANEL
ALT: 21 U/L (ref 0–44)
AST: 20 U/L (ref 15–41)
Albumin: 3.5 g/dL (ref 3.5–5.0)
Alkaline Phosphatase: 75 U/L (ref 38–126)
Anion gap: 6 (ref 5–15)
BUN: 21 mg/dL (ref 8–23)
CO2: 21 mmol/L — ABNORMAL LOW (ref 22–32)
Calcium: 9.1 mg/dL (ref 8.9–10.3)
Chloride: 106 mmol/L (ref 98–111)
Creatinine, Ser: 1.37 mg/dL — ABNORMAL HIGH (ref 0.61–1.24)
GFR, Estimated: 58 mL/min — ABNORMAL LOW (ref >60)
Glucose, Bld: 152 mg/dL — ABNORMAL HIGH (ref 70–99)
Potassium: 3.9 mmol/L (ref 3.5–5.1)
Sodium: 133 mmol/L — ABNORMAL LOW (ref 135–145)
Total Bilirubin: 0.9 mg/dL (ref 0.3–1.2)
Total Protein: 6.8 g/dL (ref 6.5–8.1)

## 2020-11-26 LAB — SALICYLATE LEVEL: Salicylate Lvl: <7 mg/dL — ABNORMAL LOW (ref 7.0–30.0)

## 2020-11-26 LAB — ETHANOL: Alcohol, Ethyl (B): <10 mg/dL (ref <10)

## 2020-11-26 NOTE — ED Triage Notes (Signed)
Patient reports SI/HI ideations with auditory hallucinations today , poor historian at triage with flight of ideas , plans to overdose and kill "white men".

## 2020-11-26 NOTE — ED Provider Notes (Signed)
[facility] EMERGENCY DEPARTMENT Provider Note   CSN: [id number] Arrival date & time: 11/26/20  2121     History Chief Complaint  Patient presents with   Suicidal / Hallucinations    Chris Cripps is a 65 y.o. male.  HPI 65 year old male with history of anemia, asthma, DM type II, hyperlipidemia, hypertension, schizophrenia with very frequent ER visits (20 in the last 6 months) resents to the ER with complaints of  suicidal homicidal ideations.  Suicidal ideations have been ongoing since yesterday.  Patient has plan to throw himself in front of a moving vehicle.  Endorses homicidal thoughts, but no plan for homicide.  Patient endorses auditory and visual hallucinations.  Patient states that he is hearing "die, die, die, you die tonight."  States that he is seeing "acid trips."  Patient denies any drug or alcohol use.  He states that he attempted suicide once however in his 58s by overdose on pills.  Patient denies any weapons in his possession.      Past Medical History:  Diagnosis Date   Anemia    Asthma    Diabetes mellitus without complication (HCC)    Gout    Hyperlipidemia    Hyperosmolar hyperglycemic coma due to diabetes mellitus without ketoacidosis (HCC)    Hypertension    Schizophrenia (HCC) 07/06/2018    Patient Active Problem List   Diagnosis Date Noted   Malingering 11/15/2020   Homelessness 09/05/2020   Stage 3a chronic kidney disease (HCC) 04/27/2020   Slow transit constipation 04/27/2020   Noncompliance with diabetes treatment 04/20/2020   Uncontrolled type 2 diabetes mellitus with hyperglycemia (HCC) 04/20/2020   Hyperkalemia, diminished renal excretion 03/29/2020   Hyperkalemia    Schizoaffective disorder, bipolar type (HCC) 02/21/2019   Hallucinations    Anxiety 09/18/2018   Swelling of both lower extremities 09/13/2018   Type 2 diabetes mellitus without complication, with long-term current use of insulin (HCC)    Anemia     Hyperlipidemia 12/18/2017   Mixed stress and urge urinary incontinence 11/25/2017   Intermittent chest pain 11/25/2017   Allergic rhinitis with a nonallergic component 07/30/2017   Food allergy 07/30/2017   Chronic gout involving toe of left foot without tophus 05/18/2017   Hemorrhoids 05/04/2017   Chronic low back pain 01/03/2017   Chronic cough 06/30/2016   Drug-induced mood disorder (HCC) 06/14/2016   Type 2 diabetes mellitus with diabetic neuropathy (HCC) 05/26/2016   Iron deficiency anemia 05/26/2016   Chronic kidney disease 05/26/2016   HTN (hypertension) 05/26/2016   Tobacco abuse 05/26/2016   History of substance abuse (HCC) 05/26/2016   History of alcohol abuse 05/26/2016   Mild persistent asthma 05/26/2016    Past Surgical History:  Procedure Laterality Date   REPLACEMENT TOTAL KNEE Left    TOE SURGERY         Family History  Adopted: Yes  Problem Relation Age of Onset   Allergic rhinitis Neg Hx    Angioedema Neg Hx    Asthma Neg Hx    Atopy Neg Hx    Eczema Neg Hx    Immunodeficiency Neg Hx    Urticaria Neg Hx     Social History   Tobacco Use   Smoking status: Some Days    Packs/day: 0.30    Years: 45.00    Pack years: 13.50    Types: Cigarettes    Start date: 05/16/1971   Smokeless tobacco: Never   Tobacco comments:    Decreased Intake to  10 cigarettes per week or less.  Vaping Use   Vaping Use: Never used  Substance Use Topics   Alcohol use: No   Drug use: No    Home Medications Prior to Admission medications   Medication Sig Start Date End Date Taking? Authorizing Provider  albuterol (VENTOLIN HFA) 108 (90 Base) MCG/ACT inhaler Inhale 1-2 puffs into the lungs every 4 (four) hours as needed for wheezing or shortness of breath. 10/29/20   Willy Eddy, MD  amLODipine (NORVASC) 10 MG tablet Take 1 tablet (10 mg total) by mouth daily. 10/29/20   Willy Eddy, MD  atorvastatin (LIPITOR) 20 MG tablet Take 1 tablet (20 mg total) by mouth at  bedtime. 10/29/20 12/28/20  Willy Eddy, MD  benztropine (COGENTIN) 1 MG tablet Take 1 tablet (1 mg total) by mouth 2 (two) times daily. 10/29/20   Willy Eddy, MD  desmopressin (DDAVP) 0.2 MG tablet Take 1 tablet (0.2 mg total) by mouth at bedtime. 10/29/20   Willy Eddy, MD  ferrous sulfate 325 (65 FE) MG tablet Take 1 tablet (325 mg total) by mouth every other day. 10/29/20   Willy Eddy, MD  gabapentin (NEURONTIN) 300 MG capsule Take 2 capsules (600 mg total) by mouth 3 (three) times daily. 10/29/20 11/28/20  Willy Eddy, MD  insulin aspart (NOVOLOG) 100 UNIT/ML injection Inject 9 Units into the skin 3 (three) times daily with meals. 10/29/20   Willy Eddy, MD  insulin glargine (LANTUS) 100 UNIT/ML injection Inject 0.4 mLs (40 Units total) into the skin daily. 10/29/20   Willy Eddy, MD  lisinopril (ZESTRIL) 10 MG tablet Take 1 tablet (10 mg total) by mouth daily. 10/29/20 11/28/20  Willy Eddy, MD  Melatonin 10 MG TABS Take 10 mg by mouth at bedtime. 10/29/20   Willy Eddy, MD  metformin (FORTAMET) 1000 MG (OSM) 24 hr tablet Take 1 tablet (1,000 mg total) by mouth daily with breakfast. 10/29/20   Willy Eddy, MD  nicotine (NICODERM CQ - DOSED IN MG/24 HOURS) 21 mg/24hr patch Place 1 patch (21 mg total) onto the skin daily. 10/29/20   Willy Eddy, MD  OLANZapine (ZYPREXA) 10 MG tablet Take 1 tablet (10 mg total) by mouth in the morning and at bedtime. 10/29/20 11/28/20  Willy Eddy, MD  paliperidone (INVEGA) 9 MG 24 hr tablet Take 1 tablet (9 mg total) by mouth daily. 10/29/20   Willy Eddy, MD  pantoprazole (PROTONIX) 40 MG tablet Take 1 tablet (40 mg total) by mouth daily. 10/29/20   Willy Eddy, MD    Allergies    Chantix [varenicline tartrate], Ativan [lorazepam], Other, Pineapple, and Penicillins  Review of Systems   Review of Systems Ten systems reviewed and are negative for acute change, except as noted in the HPI.    Physical Exam Updated Vital Signs BP 120/87 (BP Location: Right Arm)   Pulse 87   Resp 18   SpO2 100%   Physical Exam Vitals and nursing note reviewed.  Constitutional:      Appearance: He is well-developed.  HENT:     Head: Normocephalic and atraumatic.  Eyes:     Conjunctiva/sclera: Conjunctivae normal.  Cardiovascular:     Rate and Rhythm: Normal rate and regular rhythm.     Heart sounds: No murmur heard. Pulmonary:     Effort: Pulmonary effort is normal. No respiratory distress.     Breath sounds: Normal breath sounds.  Abdominal:     Palpations: Abdomen is soft.     Tenderness: There is no  abdominal tenderness.  Musculoskeletal:        General: Normal range of motion.     Cervical back: Neck supple.  Skin:    General: Skin is warm and dry.  Neurological:     General: No focal deficit present.     Mental Status: He is alert and oriented to person, place, and time.  Psychiatric:        Mood and Affect: Mood normal.        Behavior: Behavior normal.    ED Results / Procedures / Treatments   Labs (all labs ordered are listed, but only abnormal results are displayed) Labs Reviewed  COMPREHENSIVE METABOLIC PANEL - Abnormal; Notable for the following components:      Result Value   Sodium 133 (*)    CO2 21 (*)    Glucose, Bld 152 (*)    Creatinine, Ser 1.37 (*)    GFR, Estimated 58 (*)    All other components within normal limits  CBC WITH DIFFERENTIAL/PLATELET - Abnormal; Notable for the following components:   RBC 4.04 (*)    Hemoglobin 11.1 (*)    HCT 34.6 (*)    All other components within normal limits  SALICYLATE LEVEL - Abnormal; Notable for the following components:   Salicylate Lvl <7.0 (*)    All other components within normal limits  RESP PANEL BY RT-PCR (FLU A&B, COVID) ARPGX2  ETHANOL  RAPID URINE DRUG SCREEN, HOSP PERFORMED    EKG None  Radiology DG Foot Complete Left  Result Date: 11/25/2020 CLINICAL DATA:  Chronic foot pain EXAM:  LEFT FOOT - COMPLETE 3+ VIEW COMPARISON:  None. FINDINGS: There is no evidence of fracture or dislocation. Pes planus and hallux valgus. Mild degenerative change of the dorsal midfoot and first digit . Soft tissues are unremarkable. IMPRESSION: No acute osseous abnormality. Mild degenerative change of the dorsal midfoot and first digit. Pes planus and hallux valgus. Electronically Signed   By: Maudry MayhewJeffrey  Waltz MD   On: 11/25/2020 19:40   DG Foot Complete Right  Result Date: 11/25/2020 CLINICAL DATA:  Chronic right greater than left foot pain, no known injury EXAM: RIGHT FOOT COMPLETE - 3+ VIEW COMPARISON:  April 17, 2017 FINDINGS: There is no evidence of fracture or dislocation. Pes planus and hallux valgus. Mild first digit and dorsal midfoot degenerative change. Soft tissues are unremarkable. IMPRESSION: No acute osseous abnormality. Mild degenerative change of the dorsal midfoot and first digit. Pes planus and hallux valgus. Electronically Signed   By: Maudry MayhewJeffrey  Waltz MD   On: 11/25/2020 19:35    Procedures Procedures   Medications Ordered in ED Medications - No data to display  ED Course  I have reviewed the triage vital signs and the nursing notes.  Pertinent labs & imaging results that were available during my care of the patient were reviewed by me and considered in my medical decision making (see chart for details).    MDM Rules/Calculators/A&P                         Patient presents to the ER with complaints of known homicidal ideation.  Requiring medical current clearance for evaluation by psychiatry.  On presentation, the patient is calm, cooperative, but endorsing suicidal and homicidal ideations.  Vitals personally reviewed by me, overall reassuring.  I personally reviewed his lab work, which did not show any significant abnormalities.  CBC without leukocytosis, hemoglobin did drop to 11.1 from 3 days ago,  patient denying any rectal bleeding.  CMP without significant electrolyte  abnormalities, creatinine of 1.37, does appear to fluctuate, question dehydration.  Negative acetaminophen, salicylate, ethanol.  UDS pending.   He has been medically cleared for further evaluation by TTS.  Patient is here voluntarily.  He may require IVC given concrete plan for suicidal ideation.  Dispo according to the recommendation.   Final Clinical Impression(s) / ED Diagnoses Final diagnoses:  Suicidal ideation  Homicidal ideation    Rx / DC Orders ED Discharge Orders     None        Leone Brand 11/26/20 2322    Terald Sleeper, MD 11/27/20 1042

## 2020-11-26 NOTE — ED Provider Notes (Signed)
Emergency Medicine Provider Triage Evaluation Note  Dylan Jordan , a 65 y.o. male  was evaluated in triage.  Pt complains of of suicidal ideations.  Suicidal ideations have been ongoing since yesterday.  Patient has plan to throw himself in front of a moving vehicle.  Endorses homicidal thoughts, states that he would like to kill me the provider.  Patient denies any plan for homicide.  Patient endorses auditory and visual hallucinations.  Patient states that he is hearing "die, die, die, you die tonight."  States that he is seeing "acid trips."  Patient denies any drug or alcohol use.  He states that he attempted suicide once however in his 2s by overdose on pills.  Patient denies any weapons in his possession.    Review of Systems  Positive: SI, HI, AVH Negative:   Physical Exam  BP 120/87 (BP Location: Right Arm)   Pulse 87   Resp 18   SpO2 100%  Gen:   Awake, no distress   Resp:  Normal effort  MSK:   Moves extremities without difficulty  Other:    Medical Decision Making  Medically screening exam initiated at 9:44 PM.  Appropriate orders placed.  Dylan Jordan was informed that the remainder of the evaluation will be completed by another provider, this initial triage assessment does not replace that evaluation, and the importance of remaining in the ED until their evaluation is complete.  The patient appears stable so that the remainder of the work up may be completed by another provider.      Haskel Schroeder, PA-C 11/27/20 1104    Charlynne Pander, MD 11/27/20 [phone]

## 2020-11-26 NOTE — BH Assessment (Signed)
TTS spoke to [employer], about putting pt in private room to complete TTS assessment.  Clinician to call the cart in five minutes.

## 2020-11-27 ENCOUNTER — Encounter (HOSPITAL_COMMUNITY): Payer: Self-pay | Admitting: Emergency Medicine

## 2020-11-27 ENCOUNTER — Emergency Department (HOSPITAL_COMMUNITY)
Admission: EM | Admit: 2020-11-27 | Discharge: 2020-11-28 | Disposition: A | Discharge status: Home / Self Care | Payer: Medicaid Other | Source: Home / Self Care | Attending: Emergency Medicine | Admitting: Emergency Medicine

## 2020-11-27 DIAGNOSIS — M79672 Pain in left foot: Secondary | ICD-10-CM | POA: Insufficient documentation

## 2020-11-27 DIAGNOSIS — Z794 Long term (current) use of insulin: Secondary | ICD-10-CM | POA: Insufficient documentation

## 2020-11-27 DIAGNOSIS — I129 Hypertensive chronic kidney disease with stage 1 through stage 4 chronic kidney disease, or unspecified chronic kidney disease: Secondary | ICD-10-CM | POA: Insufficient documentation

## 2020-11-27 DIAGNOSIS — M79671 Pain in right foot: Secondary | ICD-10-CM | POA: Insufficient documentation

## 2020-11-27 DIAGNOSIS — Z79899 Other long term (current) drug therapy: Secondary | ICD-10-CM | POA: Insufficient documentation

## 2020-11-27 DIAGNOSIS — Z96652 Presence of left artificial knee joint: Secondary | ICD-10-CM | POA: Insufficient documentation

## 2020-11-27 DIAGNOSIS — E1140 Type 2 diabetes mellitus with diabetic neuropathy, unspecified: Secondary | ICD-10-CM | POA: Insufficient documentation

## 2020-11-27 DIAGNOSIS — N1831 Chronic kidney disease, stage 3a: Secondary | ICD-10-CM | POA: Insufficient documentation

## 2020-11-27 DIAGNOSIS — E1122 Type 2 diabetes mellitus with diabetic chronic kidney disease: Secondary | ICD-10-CM | POA: Insufficient documentation

## 2020-11-27 DIAGNOSIS — F17210 Nicotine dependence, cigarettes, uncomplicated: Secondary | ICD-10-CM | POA: Insufficient documentation

## 2020-11-27 DIAGNOSIS — J4530 Mild persistent asthma, uncomplicated: Secondary | ICD-10-CM | POA: Insufficient documentation

## 2020-11-27 DIAGNOSIS — B353 Tinea pedis: Secondary | ICD-10-CM

## 2020-11-27 DIAGNOSIS — Z7984 Long term (current) use of oral hypoglycemic drugs: Secondary | ICD-10-CM | POA: Insufficient documentation

## 2020-11-27 LAB — RAPID URINE DRUG SCREEN, HOSP PERFORMED
Amphetamines: NOT DETECTED
Barbiturates: NOT DETECTED
Benzodiazepines: NOT DETECTED
Cocaine: NOT DETECTED
Opiates: NOT DETECTED
Tetrahydrocannabinol: NOT DETECTED

## 2020-11-27 LAB — CBG MONITORING, ED: Glucose-Capillary: 189 mg/dL — ABNORMAL HIGH (ref 70–99)

## 2020-11-27 LAB — RESP PANEL BY RT-PCR (FLU A&B, COVID) ARPGX2
Influenza A by PCR: NEGATIVE
Influenza B by PCR: NEGATIVE
SARS Coronavirus 2 by RT PCR: NEGATIVE

## 2020-11-27 NOTE — ED Provider Notes (Signed)
Emergency Medicine Observation Re-evaluation Note  Dylan Jordan is a 65 y.o. male, seen on rounds today.  Pt initially presented to the ED for complaints of transient suicidal thoughts. Pt with   Physical Exam  BP 129/82 (BP Location: Left Arm)   Pulse 69   Temp 98.1 F (36.7 C) (Oral)   Resp 18   SpO2 97%  Physical Exam General: calm, alert.  Cardiac: regular rate Lungs: breathing comfortably Psych: calm, alert. Normal mood and affect. Pt does not appear to be responding to internal stimuli. No thoughts of harm to self or others.   ED Course / MDM  EKG:   I have reviewed the labs performed to date as well as medications administered while in observation.  Recent changes in the last 24 hours include observation, and reassessment.   Plan  BH team has reassessed and they indicate patient is psych clear for discharge, indicating pt well known to them and appears c/w baseline.   Currently, normal mood/affect. No thoughts of harm to self or others. No acute psychosis. Vitals normal. No physical c/o or symptoms.   Pt currently appears stable for d/c.      Cathren Laine, MD 11/27/20 1225

## 2020-11-27 NOTE — ED Notes (Signed)
Patient showering at this time. Toiletries and fresh scrubs provided.

## 2020-11-27 NOTE — Progress Notes (Signed)
[name] reached out to [employer] [facility] at the provider's request in reference to collecting collateral information for this patient's ACTT. [name] spoke with on-call staff Katie. It was confirmed that the patient does receive services from there ACTT services. Katie reported that they attempt to see the patient twice a week, however he is difficult to locate to administer medications and to connect to there housing specialist. It was reported that the patient frequent the [facility] and the [facility] through the week. Katie provided phone numbers to the ACTT services crisis line phone#: [phone]. Further, other phone #'s listed [phone]. A few medications mentioned that the patient are Invegas 234mg /1.69ml injection; Zyprexa 15mg  1 tablet by mouth daily; Geodon 60mg  twice daily; Depokote 500mg  extended release ( one in the morning and one at night); and Benztropine 1mg  tablet by mouth twice a day. Katie expressed that it has been difficult to locate the patient for staff to administer medication or the patient refuses medications to alleviate worsening symptoms.  4m, MSW, LCSW-A, LCAS-A Phone: [phone] Disposition/TOC

## 2020-11-27 NOTE — Discharge Instructions (Addendum)
It was our pleasure to provide your ER care today - we hope that you feel better.  Drink plenty of fluids/stay well hydrated.   Use resource guide provided to help access social services/community resources.   For mental health issues and/or crisis, you may go directly to [facility] - it is open 24/7 and walk-ins are welcome.   Return to ER if worse, new symptoms, fevers, chest pain, trouble breathing, or other emergency concern.

## 2020-11-27 NOTE — ED Notes (Signed)
TTS with patient at this time.

## 2020-11-27 NOTE — ED Triage Notes (Signed)
Pt report bilateral feet swelling/pain. No other complaints other than thirst.  CBG 189

## 2020-11-27 NOTE — Consult Note (Signed)
Telepsych Consultation   Reason for Consult: Suicidal ideations  Referring Physician:  EDP Location of Patient: 60[facility] Location of Provider: [facility]  Patient Identification: Dylan Jordan MRN:  [id number] Principal Diagnosis: <principal problem not specified> Diagnosis:  Active Problems:   * No active hospital problems. *   Total Time spent with patient: 15 minutes  Subjective:   Dylan Jordan is a 64 y.o. male was seen and evaluated via tele assessment.  Patient presents irritable and guarded throughout this assessment.  Patient abruptly stopped speaking to this provider throughout this assessment. [name] followed up with patient's ACT services for additional collateral.  Patient to be psychiatrically cleared and referred back to his current ACT providers.Case staffed with attending MD Lucianne Muss. Support, encouragement and reassurance was provide. Support,encouragement reassurance was provided.   HPI:  per admission assessment note: Dylan Jordan , a 64 y.o. male  was evaluated in triage.  Pt complains of of suicidal ideations.  Suicidal ideations have been ongoing since yesterday.  Patient has plan to throw himself in front of a moving vehicle.  Endorses homicidal thoughts, states that he would like to kill me the provider.  Patient denies any plan for homicide.  Patient endorses auditory and visual hallucinations.  Patient states that he is hearing "die, die, die, you die tonight."  States that he is seeing "acid trips."  Patient denies any drug or alcohol use.  He states that he attempted suicide once however in his 4s by overdose on pills.  Patient denies any weapons in his possession.  Past Psychiatric History:   Risk to Self:   Risk to Others:   Prior Inpatient Therapy:   Prior Outpatient Therapy:    Past Medical History:  Past Medical History:  Diagnosis Date   Anemia    Asthma    Diabetes mellitus without complication (HCC)    Gout    Hyperlipidemia     Hyperosmolar hyperglycemic coma due to diabetes mellitus without ketoacidosis (HCC)    Hypertension    Schizophrenia (HCC) 07/06/2018    Past Surgical History:  Procedure Laterality Date   REPLACEMENT TOTAL KNEE Left    TOE SURGERY     Family History:  Family History  Adopted: Yes  Problem Relation Age of Onset   Allergic rhinitis Neg Hx    Angioedema Neg Hx    Asthma Neg Hx    Atopy Neg Hx    Eczema Neg Hx    Immunodeficiency Neg Hx    Urticaria Neg Hx    Family Psychiatric  History:  Social History:  Social History   Substance and Sexual Activity  Alcohol Use No     Social History   Substance and Sexual Activity  Drug Use No    Social History   Socioeconomic History   Marital status: Single    Spouse name: Not on file   Number of children: Not on file   Years of education: Not on file   Highest education level: Not on file  Occupational History   Not on file  Tobacco Use   Smoking status: Some Days    Packs/day: 0.30    Years: 45.00    Pack years: 13.50    Types: Cigarettes    Start date: 05/16/1971   Smokeless tobacco: Never   Tobacco comments:    Decreased Intake to 10 cigarettes per week or less.  Vaping Use   Vaping Use: Never used  Substance and Sexual Activity   Alcohol use: No  Drug use: No   Sexual activity: Not on file  Other Topics Concern   Not on file  Social History Narrative   Lives by himself at home. Closest family member is niece.       ETOH none   Drugs non e   Current smoker, none for three days    Social Determinants of Health   Financial Resource Strain: Not on file  Food Insecurity: Not on file  Transportation Needs: Not on file  Physical Activity: Not on file  Stress: Not on file  Social Connections: Not on file   Additional Social History:    Allergies:   Allergies  Allergen Reactions   Chantix [Varenicline Tartrate] Other (See Comments)    Nightmares   Ativan [Lorazepam] Other (See Comments)    Agitation  (and "5 others like it") Will need to call [facility] [place] (phone) [phone] and [phone] to fax a release-    Other Other (See Comments)    Chinese Rubber Tree Stem Bark- sneezing   Pineapple Other (See Comments)    Pt states "all my allergies make me feel eerie." " It does not make me feel absolute."    Penicillins Diarrhea    Labs:  Results for orders placed or performed during the hospital encounter of 11/26/20 (from the past 48 hour(s))  Comprehensive metabolic panel     Status: Abnormal   Collection Time: 11/26/20 10:10 PM  Result Value Ref Range   Sodium 133 (L) 135 - 145 mmol/L   Potassium 3.9 3.5 - 5.1 mmol/L   Chloride 106 98 - 111 mmol/L   CO2 21 (L) 22 - 32 mmol/L   Glucose, Bld 152 (H) 70 - 99 mg/dL    Comment: Glucose reference range applies only to samples taken after fasting for at least 8 hours.   BUN 21 8 - 23 mg/dL   Creatinine, Ser 9.41 (H) 0.61 - 1.24 mg/dL   Calcium 9.1 8.9 - 74.0 mg/dL   Total Protein 6.8 6.5 - 8.1 g/dL   Albumin 3.5 3.5 - 5.0 g/dL   AST 20 15 - 41 U/L   ALT 21 0 - 44 U/L   Alkaline Phosphatase 75 38 - 126 U/L   Total Bilirubin 0.9 0.3 - 1.2 mg/dL   GFR, Estimated 58 (L) >60 mL/min    Comment: (NOTE) Calculated using the CKD-EPI Creatinine Equation (2021)    Anion gap 6 5 - 15    Comment: Performed at [facility], [address] [place], [place] [address]  Ethanol     Status: None   Collection Time: 11/26/20 10:10 PM  Result Value Ref Range   Alcohol, Ethyl (B) <10 <10 mg/dL    Comment: (NOTE) Lowest detectable limit for serum alcohol is 10 mg/dL.  For medical purposes only. Performed at [facility], [address], [place], [place] [address]   CBC with Diff     Status: Abnormal   Collection Time: 11/26/20 10:10 PM  Result Value Ref Range   WBC 6.5 4.0 - 10.5 K/uL   RBC 4.04 (L) 4.22 - 5.81 MIL/uL   Hemoglobin 11.1 (L) 13.0 - 17.0 g/dL   HCT 14.9 (L) 70.2 - 63.7 %   MCV 85.6 80.0 -  100.0 fL   MCH 27.5 26.0 - 34.0 pg   MCHC 32.1 30.0 - 36.0 g/dL   RDW 85.8 85.0 - 27.7 %   Platelets 352 150 - 400 K/uL  nRBC 0.0 0.0 - 0.2 %   Neutrophils Relative % 54 %   Neutro Abs 3.4 1.7 - 7.7 K/uL   Lymphocytes Relative 34 %   Lymphs Abs 2.2 0.7 - 4.0 K/uL   Monocytes Relative 10 %   Monocytes Absolute 0.7 0.1 - 1.0 K/uL   Eosinophils Relative 2 %   Eosinophils Absolute 0.1 0.0 - 0.5 K/uL   Basophils Relative 0 %   Basophils Absolute 0.0 0.0 - 0.1 K/uL   Immature Granulocytes 0 %   Abs Immature Granulocytes 0.02 0.00 - 0.07 K/uL    Comment: Performed at [facility], [address] [place], [place] [address]  Salicylate level     Status: Abnormal   Collection Time: 11/26/20 10:10 PM  Result Value Ref Range   Salicylate Lvl <7.0 (L) 7.0 - 30.0 mg/dL    Comment: Performed at [facility], [address] [place], [place] [address]  Resp Panel by RT-PCR (Flu A&B, Covid) Nasopharyngeal Swab     Status: None   Collection Time: 11/27/20  2:20 AM   Specimen: Nasopharyngeal Swab; Nasopharyngeal(NP) swabs in vial transport medium  Result Value Ref Range   SARS Coronavirus 2 by RT PCR NEGATIVE NEGATIVE    Comment: (NOTE) SARS-CoV-2 target nucleic acids are NOT DETECTED.  The SARS-CoV-2 RNA is generally detectable in upper respiratory specimens during the acute phase of infection. The lowest concentration of SARS-CoV-2 viral copies this assay can detect is 138 copies/mL. A negative result does not preclude SARS-Cov-2 infection and should not be used as the sole basis for treatment or other patient management decisions. A negative result may occur with  improper specimen collection/handling, submission of specimen other than nasopharyngeal swab, presence of viral mutation(s) within the areas targeted by this assay, and inadequate number of viral copies(<138 copies/mL). A negative result must be combined with clinical observations, patient history, and  epidemiological information. The expected result is Negative.  Fact Sheet for Patients:  BloggerCourse.com  Fact Sheet for Healthcare Providers:  SeriousBroker.it  This test is no t yet approved or cleared by the [place] FDA and  has been authorized for detection and/or diagnosis of SARS-CoV-2 by FDA under an Emergency Use Authorization (EUA). This EUA will remain  in effect (meaning this test can be used) for the duration of the COVID-19 declaration under Section 564(b)(1) of the Act, 21 U.S.C.section 360bbb-3(b)(1), unless the authorization is terminated  or revoked sooner.       Influenza A by PCR NEGATIVE NEGATIVE   Influenza B by PCR NEGATIVE NEGATIVE    Comment: (NOTE) The Xpert Xpress SARS-CoV-2/FLU/RSV plus assay is intended as an aid in the diagnosis of influenza from Nasopharyngeal swab specimens and should not be used as a sole basis for treatment. Nasal washings and aspirates are unacceptable for Xpert Xpress SARS-CoV-2/FLU/RSV testing.  Fact Sheet for Patients: BloggerCourse.com  Fact Sheet for Healthcare Providers: SeriousBroker.it  This test is not yet approved or cleared by the [place] FDA and has been authorized for detection and/or diagnosis of SARS-CoV-2 by FDA under an Emergency Use Authorization (EUA). This EUA will remain in effect (meaning this test can be used) for the duration of the COVID-19 declaration under Section 564(b)(1) of the Act, 21 U.S.C. section 360bbb-3(b)(1), unless the authorization is terminated or revoked.  Performed at [facility], [address] [place], [place] [address]   Urine rapid drug screen (hosp performed)     Status: None  Collection Time: 11/27/20  2:20 AM  Result Value Ref Range   Opiates NONE DETECTED NONE DETECTED   Cocaine NONE DETECTED NONE DETECTED   Benzodiazepines NONE DETECTED NONE  DETECTED   Amphetamines NONE DETECTED NONE DETECTED   Tetrahydrocannabinol NONE DETECTED NONE DETECTED   Barbiturates NONE DETECTED NONE DETECTED    Comment: (NOTE) DRUG SCREEN FOR MEDICAL PURPOSES ONLY.  IF CONFIRMATION IS NEEDED FOR ANY PURPOSE, NOTIFY LAB WITHIN 5 DAYS.  LOWEST DETECTABLE LIMITS FOR URINE DRUG SCREEN Drug Class                     Cutoff (ng/mL) Amphetamine and metabolites    1000 Barbiturate and metabolites    200 Benzodiazepine                 200 Tricyclics and metabolites     300 Opiates and metabolites        300 Cocaine and metabolites        300 THC                            50 Performed at [facility], [address] [place], [place] [address]     Medications:  No current facility-administered medications for this encounter.   Current Outpatient Medications  Medication Sig Dispense Refill   albuterol (VENTOLIN HFA) 108 (90 Base) MCG/ACT inhaler Inhale 1-2 puffs into the lungs every 4 (four) hours as needed for wheezing or shortness of breath. 18 g 1   amLODipine (NORVASC) 10 MG tablet Take 1 tablet (10 mg total) by mouth daily. 30 tablet 1   atorvastatin (LIPITOR) 20 MG tablet Take 1 tablet (20 mg total) by mouth at bedtime. 30 tablet 1   benztropine (COGENTIN) 1 MG tablet Take 1 tablet (1 mg total) by mouth 2 (two) times daily. 60 tablet 1   desmopressin (DDAVP) 0.2 MG tablet Take 1 tablet (0.2 mg total) by mouth at bedtime. 30 tablet 1   ferrous sulfate 325 (65 FE) MG tablet Take 1 tablet (325 mg total) by mouth every other day. 15 tablet 1   gabapentin (NEURONTIN) 300 MG capsule Take 2 capsules (600 mg total) by mouth 3 (three) times daily. 180 capsule 0   insulin aspart (NOVOLOG) 100 UNIT/ML injection Inject 9 Units into the skin 3 (three) times daily with meals. 10 mL 1   insulin glargine (LANTUS) 100 UNIT/ML injection Inject 0.4 mLs (40 Units total) into the skin daily. 10 mL 2   lisinopril (ZESTRIL) 10 MG tablet Take 1 tablet (10 mg  total) by mouth daily. 30 tablet 1   Melatonin 10 MG TABS Take 10 mg by mouth at bedtime. 30 tablet 1   metformin (FORTAMET) 1000 MG (OSM) 24 hr tablet Take 1 tablet (1,000 mg total) by mouth daily with breakfast. 30 tablet 1   nicotine (NICODERM CQ - DOSED IN MG/24 HOURS) 21 mg/24hr patch Place 1 patch (21 mg total) onto the skin daily. 28 patch 0   OLANZapine (ZYPREXA) 10 MG tablet Take 1 tablet (10 mg total) by mouth in the morning and at bedtime. 30 tablet 1   paliperidone (INVEGA) 9 MG 24 hr tablet Take 1 tablet (9 mg total) by mouth daily. 30 tablet 1   pantoprazole (PROTONIX) 40 MG tablet Take 1 tablet (40 mg total) by mouth daily. 30 tablet 1    Musculoskeletal: Strength & Muscle Tone: within normal limits Gait &  Station: normal Patient leans: N/A          Psychiatric Specialty Exam:  Presentation  General Appearance: Appropriate for Environment  Eye Contact:Minimal  Speech:Clear and Coherent; Normal Rate  Speech Volume:Normal  Handedness:Right   Mood and Affect  Mood:Euthymic  Affect:Congruent   Thought Process  Thought Processes:Linear  Descriptions of Associations:Intact  Orientation:Full (Time, Place and Person)  Thought Content:WDL  History of Schizophrenia/Schizoaffective disorder:Yes  Duration of Psychotic Symptoms:Less than six months  Hallucinations:No data recorded Ideas of Reference:None  Suicidal Thoughts:No data recorded Homicidal Thoughts:No data recorded  Sensorium  Memory:Other (comment) (Unable to assess patient will not answer questions)  Judgment:Intact  Insight:Shallow   Executive Functions  Concentration:Good  Attention Span:Good  Recall:Good  Fund of Knowledge:Fair  Language:Good   Psychomotor Activity  Psychomotor Activity: No data recorded  Assets  Assets:Communication Skills; Resilience; Desire for Improvement   Sleep  Sleep: No data recorded   Physical Exam: Physical Exam Vitals reviewed.   Cardiovascular:     Rate and Rhythm: Normal rate.  Neurological:     Mental Status: He is alert.  Psychiatric:        Mood and Affect: Mood normal.        Thought Content: Thought content normal.   Review of Systems  Psychiatric/Behavioral:  Positive for depression. Negative for hallucinations. The patient is nervous/anxious.   All other systems reviewed and are negative. Blood pressure 129/82, pulse 69, temperature 98.1 F (36.7 C), temperature source Oral, resp. rate 18, SpO2 97 %. There is no height or weight on file to calculate BMI.  Treatment Plan Summary: Daily contact with patient to assess and evaluate symptoms and progress in treatment and Medication management - [facility] disposition recommendation  - See chart for LCSW notes  Disposition: No evidence of imminent risk to self or others at present.   Patient does not meet criteria for psychiatric inpatient admission. Supportive therapy provided about ongoing stressors. Refer to IOP. Discussed crisis plan, support from social network, calling 911, coming to the Emergency Department, and calling Suicide Hotline.  This service was provided via telemedicine using a 2-way, interactive audio and video technology.  Names of all persons participating in this telemedicine service and their role in this encounter. Name: Parthenia Ameshilip Pint Role: Patient   Name: T. Kellyanne Ellwanger Role: NP   Name:  Role:   Name:  Role:     Oneta Rackanika N Leylah Tarnow, NP 11/27/2020 11:37 AM

## 2020-11-27 NOTE — ED Notes (Signed)
This RN returned patient's belongings and attempted to review discharge paperwork with patient. Patient began stating that he was going to make this RNs baby "be born dead" and repeated that he hoped this RNs baby would be born dead. Security called to bedside to escort patient from ED. Patient tore up discharge paperwork and refusing to leave. Security informed patient that he was discharged and is now trespassing on hospital property. Patient escorted out of ED by hospital security.

## 2020-11-27 NOTE — BH Assessment (Signed)
Comprehensive Clinical Assessment (CCA) Note  11/27/2020 Dylan Jordan [id number]  Chief Complaint:  Chief Complaint  Patient presents with   Suicidal / Hallucinations   Visit Diagnosis:   F25.1 Schizoaffective disorder, Depressive type  Flowsheet Row ED from 11/26/2020 in [facility] EMERGENCY DEPARTMENT ED from 11/25/2020 in [facility] EMERGENCY DEPARTMENT ED from 11/24/2020 in [facility] EMERGENCY DEPARTMENT  C-SSRS RISK CATEGORY High Risk Error: Question 6 not populated Error: Q3, 4, or 5 should not be populated when Q2 is No      The patient demonstrates the following risk factors for suicide: Chronic risk factors for suicide include: psychiatric disorder of schizoaffective disorder, depressive type, previous suicide attempts by overdose. . Acute risk factors for suicide include: social withdrawal/isolation and loss (financial, interpersonal, professional). Protective factors for this patient include: positive social support, positive therapeutic relationship, coping skills, hope for the future, and life satisfaction. Considering these factors, the overall suicide risk at this point appears to be high. Patient is not appropriate for outpatient follow up.   Disposition Dylan Bobbitt NP, recommends Pt to be psychiatric cleared once collateral information is obtain.  Disposition Social worker will seek information in the AM from Pt's Act Team. Disposition discussed with Dylan Jordan, via secure chat in Epic.  RN to discuss disposition with EDP.   Dylan Jordan is a 65 years old patient who presents voluntarily to [facility] ED and unaccompanied bh himself.  Pt did not provide TTS permission to contact Dylan Jordan, [phone].  Pt reports the following symptoms: irritable, crying, isolating, helplessness and fatigue.  Pt reports suicidal ideation  "I plan to walk in front of traffic" and previous suicide attempt that occurred when he was in his  20's by overdose on pills.  Pt denies homicidal ideation and AVH.  Pt reports that his appetite has decreased and his sleep pattern is on and off during the night.  Pt denies intentional self injurious behaviors.  Pt denies the used of drinking alcohol or any other substance use.  Pt reports that he occassionally smoke cigarettes.  Pt identify his primary triggers "when I am alone, I have a urged to kill myself" no plan.  Pt reports that he lives alone.  Pt report his primary sole support is Dylan Jordan, sister, [phone].  Pt denies history of mental illness and substance use.  Pt denies any history of abuse or trauma.  Pt reports that he has three pending court dates for: December 01, 2020, January 06, 2021 and January 09, 2021.  Pt reports no access to guns or weapons.  Pt says he is not currently receiving weekly outpatient therapy ; also is not receiving outpatient medication management. Pt reports one prior inpatient psychiatric hospitalization in June 2022.  Pt is dressed in scrubs, alert, oriented x 4 with no garbled speech and restless motor behavior.  Eye contact is avoided throughout assessment.  Pt mood is irritable and affect labile.  Thought process is suspicious.   Pt's insight is poor and judgment is poor. There is no indicating Pt is currently responding to internal stimuli or experiencing delusional thought content.  Pt was passive throughout the assessment and refused to answer any more questions.       CCA Screening, Triage and Referral (STR)  Patient Reported Information How did you hear about [place]? Self  What Is the Reason for Your Visit/Call Today? SI, Depression  How Long Has This Been Causing You Problems? <Week  What Do You Feel Would Help You the Most Today? Social Support; Treatment for Depression or other mood problem   Have You Recently Had Any Thoughts About Hurting Yourself? Yes  Are You Planning to Commit Suicide/Harm Yourself At This time? No   Have  you Recently Had Thoughts About Hurting Someone Dylan Jordan? No  Are You Planning to Harm Someone at This Time? No  Explanation: No data recorded  Have You Used Any Alcohol or Drugs in the Past 24 Hours? No  How Long Ago Did You Use Drugs or Alcohol? No data recorded What Did You Use and How Much? No data recorded  Do You Currently Have a Therapist/Psychiatrist? -- (Per chart pt is linked to [facility] ACT Team.)  Name of Therapist/Psychiatrist: No data recorded  Have You Been Recently Discharged From Any Office Practice or Programs? No  Explanation of Discharge From Practice/Program: No data recorded    CCA Screening Triage Referral Assessment Type of Contact: Tele-Assessment  Telemedicine Service Delivery:   Is this Initial or Reassessment? Initial Assessment  Date Telepsych consult ordered in [facility]:  11/14/20  Time Telepsych consult ordered in [facility]:  2156  Location of Assessment: [facility] ED  Provider Location: [facility] Assessment Services   Collateral Involvement: Pt respond when clinician asked about collateral involvement.   Does Patient Have a Automotive engineer Guardian? No data recorded Name and Contact of Legal Guardian: self  If Minor and Not Living with Parent(s), Who has Custody? n/a  Is CPS involved or ever been involved? Never  Is APS involved or ever been involved? Never   Patient Determined To Be At Risk for Harm To Self or Others Based on Review of Patient Reported Information or Presenting Complaint? Yes, for Harm to Others  Method: No Plan  Availability of Means: -- (UTA)  Intent: Vague intent or NA  Notification Required: Identifiable person is aware  Additional Information for Danger to Others Potential: Active psychosis  Additional Comments for Danger to Others Potential: Pt reports, hearing and seeing things.  Are There Guns or Other Weapons in Your Home? -- (UTA)  Types of Guns/Weapons: No data recorded Are These Weapons Safely  Secured?                            No data recorded Who Could Verify You Are Able To Have These Secured: No data recorded Do You Have any Outstanding Charges, Pending Court Dates, Parole/Probation? No data recorded Contacted To Inform of Risk of Harm To Self or Others: Other: Comment (Pt threatened Dr. Jacqulyn Bath and clinician.)    Does Patient Present under Involuntary Commitment? No  IVC Papers Initial File Date: No data recorded  [place] of Residence: [place]   Patient Currently Receiving the Following Services: ACTT Psychologist, educational) (Per chart.)   Determination of Need: Emergent (2 hours)   Options For Referral: Outpatient Therapy; Medication Management     CCA Biopsychosocial Patient Reported Schizophrenia/Schizoaffective Diagnosis in Past: Yes   Strengths: Unknown   Mental Health Symptoms Depression:   Sleep (too much or little); Difficulty Concentrating; Fatigue; Irritability; Hopelessness; Increase/decrease in appetite   Duration of Depressive symptoms:  Duration of Depressive Symptoms: Less than two weeks   Mania:   None   Anxiety:    None   Psychosis:   None   Duration of Psychotic symptoms:  Duration of Psychotic Symptoms: Less than six months   Trauma:   None  Obsessions:   None   Compulsions:   None   Inattention:   None   Hyperactivity/Impulsivity:   None   Oppositional/Defiant Behaviors:   None   Emotional Irregularity:   None   Other Mood/Personality Symptoms:  No data recorded   Mental Status Exam Appearance and self-care  Stature:   Average   Weight:   Average weight   Clothing:   Casual   Grooming:   Normal   Cosmetic use:   None   Posture/gait:   Normal   Motor activity:   Agitated; Restless   Sensorium  Attention:   Distractible   Concentration:   Focuses on irrelevancies   Orientation:   Object; Person; Place; Situation   Recall/memory:   Defective in Immediate; Defective in  Short-term   Affect and Mood  Affect:   Labile   Mood:   Pessimistic; Irritable   Relating  Eye contact:   Avoided   Facial expression:   Tense; Sad; Depressed   Attitude toward examiner:   Irritable; Passive   Thought and Language  Speech flow:  Pressured   Thought content:   Suspicious   Preoccupation:   None   Hallucinations:   None   Organization:  No data recorded  [employer]iliated Computer ServicesExecutive Functions  Fund of Knowledge:   Average   Intelligence:   Average   Abstraction:   Concrete   Judgement:   Poor   Reality Testing:   Distorted   Insight:   Poor   Decision Making:   Impulsive   Social Functioning  Social Maturity:   Impulsive   Social Judgement:   Heedless   Stress  Stressors:   Other (Comment)   Coping Ability:   Deficient supports   Skill Deficits:   None   Supports:   Support needed     Religion: Religion/Spirituality Are You A Religious Person?:  (UTA) How Might This Affect Treatment?: UTA  Leisure/Recreation: Leisure / Recreation Do You Have Hobbies?: No  Exercise/Diet: Exercise/Diet Do You Exercise?: No Have You Gained or Lost A Significant Amount of Weight in the Past Six Months?: No Do You Follow a Special Diet?: No Do You Have Any Trouble Sleeping?: Yes Explanation of Sleeping Difficulties: 'No sleep in several days''   CCA Employment/Education Employment/Work Situation: Employment / Work Situation Employment Situation: Unemployed Patient's Job has Been Impacted by Current Illness: No Has Patient ever Been in Equities traderthe Military?: No  Education: Education Is Patient Currently Attending School?: No Last Grade Completed:  (UTA) Did You Attend College?:  (UTA) Did You Have An Individualized Education Program (IIEP):  (UTA) Did You Have Any Difficulty At School?:  (UTA) Patient's Education Has Been Impacted by Current Illness:  (UTa)   CCA Family/Childhood History Family and Relationship History: Family  history Marital status: Single Does patient have children?: No  Childhood History:  Childhood History By whom was/is the patient raised?:  (UTA) Description of patient's current relationship with siblings: [name] aware of 1 sister whom he calls while on the unit. Did patient suffer any verbal/emotional/physical/sexual abuse as a child?:  (UTA) Did patient suffer from severe childhood neglect?:  (UTA) Has patient ever been sexually abused/assaulted/raped as an adolescent or adult?:  (UTA) Was the patient ever a victim of a crime or a disaster?:  (UTA) Witnessed domestic violence?:  (UTA) Has patient been affected by domestic violence as an adult?:  (unknown)  Child/Adolescent Assessment:     CCA Substance Use Alcohol/Drug Use: Alcohol / Drug Use Pain Medications:  Please see MAR Prescriptions: Please see MAr Over the Counter: Please see MAR History of alcohol / drug use?: No history of alcohol / drug abuse Longest period of sobriety (when/how long): Current Negative Consequences of Use:  (Denies) Withdrawal Symptoms:  (Denies)                         ASAM's:  Six Dimensions of Multidimensional Assessment  Dimension 1:  Acute Intoxication and/or Withdrawal Potential:      Dimension 2:  Biomedical Conditions and Complications:      Dimension 3:  Emotional, Behavioral, or Cognitive Conditions and Complications:     Dimension 4:  Readiness to Change:     Dimension 5:  Relapse, Continued use, or Continued Problem Potential:     Dimension 6:  Recovery/Living Environment:     ASAM Severity Score:    ASAM Recommended Level of Treatment:     Substance use Disorder (SUD)    Recommendations for Services/Supports/Treatments: Recommendations for Services/Supports/Treatments Recommendations For Services/Supports/Treatments: Other (Comment) (Pt to be observed and reassessed by psychiatry.)  Discharge Disposition:    DSM5 Diagnoses: Patient Active Problem List    Diagnosis Date Noted   Malingering 11/15/2020   Homelessness 09/05/2020   Stage 3a chronic kidney disease (HCC) 04/27/2020   Slow transit constipation 04/27/2020   Noncompliance with diabetes treatment 04/20/2020   Uncontrolled type 2 diabetes mellitus with hyperglycemia (HCC) 04/20/2020   Hyperkalemia, diminished renal excretion 03/29/2020   Hyperkalemia    Schizoaffective disorder, bipolar type (HCC) 02/21/2019   Hallucinations    Anxiety 09/18/2018   Swelling of both lower extremities 09/13/2018   Type 2 diabetes mellitus without complication, with long-term current use of insulin (HCC)    Anemia    Hyperlipidemia 12/18/2017   Mixed stress and urge urinary incontinence 11/25/2017   Intermittent chest pain 11/25/2017   Allergic rhinitis with a nonallergic component 07/30/2017   Food allergy 07/30/2017   Chronic gout involving toe of left foot without tophus 05/18/2017   Hemorrhoids 05/04/2017   Chronic low back pain 01/03/2017   Chronic cough 06/30/2016   Drug-induced mood disorder (HCC) 06/14/2016   Type 2 diabetes mellitus with diabetic neuropathy (HCC) 05/26/2016   Iron deficiency anemia 05/26/2016   Chronic kidney disease 05/26/2016   HTN (hypertension) 05/26/2016   Tobacco abuse 05/26/2016   History of substance abuse (HCC) 05/26/2016   History of alcohol abuse 05/26/2016   Mild persistent asthma 05/26/2016     Referrals to Alternative Service(s): Referred to Alternative Service(s):   Place:   Date:   Time:    Referred to Alternative Service(s):   Place:   Date:   Time:    Referred to Alternative Service(s):   Place:   Date:   Time:    Referred to Alternative Service(s):   Place:   Date:   Time:     Meryle Ready, Counselor

## 2020-11-28 ENCOUNTER — Emergency Department (HOSPITAL_COMMUNITY)
Admission: EM | Admit: 2020-11-28 | Discharge: 2020-11-29 | Disposition: A | Discharge status: Home / Self Care | Payer: Medicaid Other | Attending: Emergency Medicine | Admitting: Emergency Medicine

## 2020-11-28 ENCOUNTER — Other Ambulatory Visit: Payer: Self-pay

## 2020-11-28 ENCOUNTER — Encounter (HOSPITAL_COMMUNITY): Payer: Self-pay

## 2020-11-28 DIAGNOSIS — M25562 Pain in left knee: Secondary | ICD-10-CM | POA: Insufficient documentation

## 2020-11-28 DIAGNOSIS — Z96652 Presence of left artificial knee joint: Secondary | ICD-10-CM | POA: Insufficient documentation

## 2020-11-28 DIAGNOSIS — E1140 Type 2 diabetes mellitus with diabetic neuropathy, unspecified: Secondary | ICD-10-CM | POA: Insufficient documentation

## 2020-11-28 DIAGNOSIS — Z7984 Long term (current) use of oral hypoglycemic drugs: Secondary | ICD-10-CM | POA: Diagnosis not present

## 2020-11-28 DIAGNOSIS — N1831 Chronic kidney disease, stage 3a: Secondary | ICD-10-CM | POA: Diagnosis not present

## 2020-11-28 DIAGNOSIS — M25561 Pain in right knee: Secondary | ICD-10-CM | POA: Insufficient documentation

## 2020-11-28 DIAGNOSIS — F17210 Nicotine dependence, cigarettes, uncomplicated: Secondary | ICD-10-CM | POA: Diagnosis not present

## 2020-11-28 DIAGNOSIS — I129 Hypertensive chronic kidney disease with stage 1 through stage 4 chronic kidney disease, or unspecified chronic kidney disease: Secondary | ICD-10-CM | POA: Diagnosis not present

## 2020-11-28 DIAGNOSIS — M79671 Pain in right foot: Secondary | ICD-10-CM | POA: Diagnosis present

## 2020-11-28 DIAGNOSIS — J45909 Unspecified asthma, uncomplicated: Secondary | ICD-10-CM | POA: Diagnosis not present

## 2020-11-28 DIAGNOSIS — Z794 Long term (current) use of insulin: Secondary | ICD-10-CM | POA: Diagnosis not present

## 2020-11-28 DIAGNOSIS — Z79899 Other long term (current) drug therapy: Secondary | ICD-10-CM | POA: Insufficient documentation

## 2020-11-28 DIAGNOSIS — M79672 Pain in left foot: Secondary | ICD-10-CM | POA: Insufficient documentation

## 2020-11-28 MED ORDER — NICOTINE 21 MG/24HR TD PT24: 21.0000 mg | MEDICATED_PATCH | Freq: Every day | TRANSDERMAL | 0 refills | Status: AC

## 2020-11-28 MED ORDER — DESENEX 2 % EX POWD: Freq: Two times a day (BID) | CUTANEOUS | 0 refills | Status: AC

## 2020-11-28 NOTE — ED Provider Notes (Signed)
[facility] EMERGENCY DEPARTMENT Provider Note   CSN: [id number] Arrival date & time: 11/27/20  2328     History Chief Complaint  Patient presents with   feet pain    Dylan Jordan is a 65 y.o. male.  Patient is a 65 year old male with past medical history of schizophrenia, diabetes, hyperlipidemia.  Patient presenting today with complaints of foot pain.  He says that both feet are hurting and are scaly.  He believes he has a fungal infection and is also requesting to have his nails trimmed.  Patient well-known to the emergency department for nearly daily visits over the past several months.  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  The history is provided by the patient.      Past Medical History:  Diagnosis Date   Anemia    Asthma    Diabetes mellitus without complication (HCC)    Gout    Hyperlipidemia    Hyperosmolar hyperglycemic coma due to diabetes mellitus without ketoacidosis (HCC)    Hypertension    Schizophrenia (HCC) 07/06/2018    Patient Active Problem List   Diagnosis Date Noted   Malingering 11/15/2020   Homelessness 09/05/2020   Stage 3a chronic kidney disease (HCC) 04/27/2020   Slow transit constipation 04/27/2020   Noncompliance with diabetes treatment 04/20/2020   Uncontrolled type 2 diabetes mellitus with hyperglycemia (HCC) 04/20/2020   Hyperkalemia, diminished renal excretion 03/29/2020   Hyperkalemia    Schizoaffective disorder, bipolar type (HCC) 02/21/2019   Hallucinations    Anxiety 09/18/2018   Swelling of both lower extremities 09/13/2018   Type 2 diabetes mellitus without complication, with long-term current use of insulin (HCC)    Anemia    Hyperlipidemia 12/18/2017   Mixed stress and urge urinary incontinence 11/25/2017   Intermittent chest pain 11/25/2017   Allergic rhinitis with a nonallergic component 07/30/2017   Food allergy 07/30/2017   Chronic gout  involving toe of left foot without tophus 05/18/2017   Hemorrhoids 05/04/2017   Chronic low back pain 01/03/2017   Chronic cough 06/30/2016   Drug-induced mood disorder (HCC) 06/14/2016   Type 2 diabetes mellitus with diabetic neuropathy (HCC) 05/26/2016   Iron deficiency anemia 05/26/2016   Chronic kidney disease 05/26/2016   HTN (hypertension) 05/26/2016   Tobacco abuse 05/26/2016   History of substance abuse (HCC) 05/26/2016   History of alcohol abuse 05/26/2016   Mild persistent asthma 05/26/2016    Past Surgical History:  Procedure Laterality Date   REPLACEMENT TOTAL KNEE Left    TOE SURGERY         Family History  Adopted: Yes  Problem Relation Age of Onset   Allergic rhinitis Neg Hx    Angioedema Neg Hx    Asthma Neg Hx    Atopy Neg Hx    Eczema Neg Hx    Immunodeficiency Neg Hx    Urticaria Neg Hx     Social History   Tobacco Use   Smoking status: Some Days    Packs/day: 0.30    Years: 45.00    Pack years: 13.50    Types: Cigarettes    Start date: 05/16/1971   Smokeless tobacco: Never   Tobacco comments:    Decreased Intake to 10 cigarettes per week or less.  Vaping Use   Vaping Use: Never used  Substance Use Topics   Alcohol use: No   Drug use: No    Home  Medications Prior to Admission medications   Medication Sig Start Date End Date Taking? Authorizing Provider  albuterol (VENTOLIN HFA) 108 (90 Base) MCG/ACT inhaler Inhale 1-2 puffs into the lungs every 4 (four) hours as needed for wheezing or shortness of breath. 10/29/20   Willy Eddy, MD  amLODipine (NORVASC) 10 MG tablet Take 1 tablet (10 mg total) by mouth daily. 10/29/20   Willy Eddy, MD  atorvastatin (LIPITOR) 20 MG tablet Take 1 tablet (20 mg total) by mouth at bedtime. 10/29/20 12/28/20  Willy Eddy, MD  benztropine (COGENTIN) 1 MG tablet Take 1 tablet (1 mg total) by mouth 2 (two) times daily. 10/29/20   Willy Eddy, MD  desmopressin (DDAVP) 0.2 MG tablet Take 1 tablet  (0.2 mg total) by mouth at bedtime. 10/29/20   Willy Eddy, MD  ferrous sulfate 325 (65 FE) MG tablet Take 1 tablet (325 mg total) by mouth every other day. 10/29/20   Willy Eddy, MD  gabapentin (NEURONTIN) 300 MG capsule Take 2 capsules (600 mg total) by mouth 3 (three) times daily. 10/29/20 11/28/20  Willy Eddy, MD  insulin aspart (NOVOLOG) 100 UNIT/ML injection Inject 9 Units into the skin 3 (three) times daily with meals. 10/29/20   Willy Eddy, MD  insulin glargine (LANTUS) 100 UNIT/ML injection Inject 0.4 mLs (40 Units total) into the skin daily. 10/29/20   Willy Eddy, MD  lisinopril (ZESTRIL) 10 MG tablet Take 1 tablet (10 mg total) by mouth daily. 10/29/20 11/28/20  Willy Eddy, MD  Melatonin 10 MG TABS Take 10 mg by mouth at bedtime. 10/29/20   Willy Eddy, MD  metformin (FORTAMET) 1000 MG (OSM) 24 hr tablet Take 1 tablet (1,000 mg total) by mouth daily with breakfast. 10/29/20   Willy Eddy, MD  nicotine (NICODERM CQ - DOSED IN MG/24 HOURS) 21 mg/24hr patch Place 1 patch (21 mg total) onto the skin daily. 10/29/20   Willy Eddy, MD  OLANZapine (ZYPREXA) 10 MG tablet Take 1 tablet (10 mg total) by mouth in the morning and at bedtime. 10/29/20 11/28/20  Willy Eddy, MD  paliperidone (INVEGA) 9 MG 24 hr tablet Take 1 tablet (9 mg total) by mouth daily. 10/29/20   Willy Eddy, MD  pantoprazole (PROTONIX) 40 MG tablet Take 1 tablet (40 mg total) by mouth daily. 10/29/20   Willy Eddy, MD    Allergies    Chantix [varenicline tartrate], Ativan [lorazepam], Other, Pineapple, and Penicillins  Review of Systems   Review of Systems  All other systems reviewed and are negative.  Physical Exam Updated Vital Signs BP 117/72 (BP Location: Right Arm)   Pulse 88   Temp 98.9 F (37.2 C) (Oral)   Resp 16   SpO2 100%   Physical Exam Vitals and nursing note reviewed.  Constitutional:      General: He is not in acute distress.     Appearance: Normal appearance. He is not ill-appearing.  HENT:     Head: Normocephalic and atraumatic.  Pulmonary:     Effort: Pulmonary effort is normal.  Skin:    General: Skin is warm and dry.  Neurological:     Mental Status: He is alert.    ED Results / Procedures / Treatments   Labs (all labs ordered are listed, but only abnormal results are displayed) Labs Reviewed  CBG MONITORING, ED - Abnormal; Notable for the following components:      Result Value   Glucose-Capillary 189 (*)    All other components within normal limits  EKG None  Radiology No results found.  Procedures Procedures   Medications Ordered in ED Medications - No data to display  ED Course  I have reviewed the triage vital signs and the nursing notes.  Pertinent labs & imaging results that were available during my care of the patient were reviewed by me and considered in my medical decision making (see chart for details).    MDM Rules/Calculators/A&P  Patient advised that he can purchase over-the-counter Desitin or nystatin foot powder.  These are medications we do not stock in the emergency department.  Blood sugar today 189.  Patient otherwise appears in no distress.  He did have x-rays of both feet performed 2 days ago which showed mild degenerative changes.  Final Clinical Impression(s) / ED Diagnoses Final diagnoses:  None    Rx / DC Orders ED Discharge Orders     None        Geoffery Lyons, MD 11/28/20 0131

## 2020-11-28 NOTE — ED Triage Notes (Signed)
Patient stating both his feet hurt from walking.

## 2020-11-28 NOTE — ED Notes (Signed)
Pt discharged and ambulated out of the ED without difficulty. 

## 2020-11-28 NOTE — Discharge Instructions (Addendum)
You can purchase Desenex over-the-counter.  Apply this to both feet as directed for treatment of your fungal infection.  Follow-up with primary doctor as needed.

## 2020-11-28 NOTE — ED Notes (Signed)
Pt discharged from facility. This RN at bedside to discuss AVS with pt, however pt actively refusing to leave requesting several staff members to look at his feet. Security called to bedside and pt escorted out. Vitals refused at discharge.

## 2020-11-29 LAB — CBG MONITORING, ED: Glucose-Capillary: 181 mg/dL — ABNORMAL HIGH (ref 70–99)

## 2020-11-29 MED ORDER — ACETAMINOPHEN 500 MG PO TABS
1000.0000 mg | ORAL_TABLET | Freq: Once | ORAL | Status: DC
  Filled 2020-11-29: qty 2

## 2020-11-29 NOTE — ED Notes (Signed)
Patient arrived in burgundy scrubs, socks, sandals, personal bag and sling bag. Patient asking this nurse in triage about a wallet and other belongings. This nurse explained that we are not taking any belongings from him and we have no found any wallets. All of patients belongings gathered and brought back to the room by patient.

## 2020-11-29 NOTE — ED Notes (Signed)
Pt is being escorted out of department by [employer] after refusing to leave multiple times.

## 2020-11-29 NOTE — ED Notes (Signed)
[employer] at bedside due to pt refusing to leave with security.

## 2020-11-29 NOTE — ED Provider Notes (Signed)
[facility]-EMERGENCY DEPT Provider Note   CSN: [id number] Arrival date & time: 11/28/20  2330     History Chief Complaint  Patient presents with   Foot Pain    Dylan Jordan is a 65 y.o. male with a hx of anemia, asthma, DM type II, hyperlipidemia, hypertension, schizophrenia with very frequent ER visits (20 in the last 6 months) presents to the ED for ongoing bilateral foot and knee pain.  Pt is seen often for these complaints. X-rays on 7/14 shows degenerative changes but no fractures.  Pt denies falls or trauma.  Denies recent elevations in blood sugars.  Denies open wounds on his feet.  Reports they are scaly, but has not attempted any treatments.  No other complaints today.   The history is provided by the patient and medical records. No language interpreter was used.      Past Medical History:  Diagnosis Date   Anemia    Asthma    Diabetes mellitus without complication (HCC)    Gout    Hyperlipidemia    Hyperosmolar hyperglycemic coma due to diabetes mellitus without ketoacidosis (HCC)    Hypertension    Schizophrenia (HCC) 07/06/2018    Patient Active Problem List   Diagnosis Date Noted   Malingering 11/15/2020   Homelessness 09/05/2020   Stage 3a chronic kidney disease (HCC) 04/27/2020   Slow transit constipation 04/27/2020   Noncompliance with diabetes treatment 04/20/2020   Uncontrolled type 2 diabetes mellitus with hyperglycemia (HCC) 04/20/2020   Hyperkalemia, diminished renal excretion 03/29/2020   Hyperkalemia    Schizoaffective disorder, bipolar type (HCC) 02/21/2019   Hallucinations    Anxiety 09/18/2018   Swelling of both lower extremities 09/13/2018   Type 2 diabetes mellitus without complication, with long-term current use of insulin (HCC)    Anemia    Hyperlipidemia 12/18/2017   Mixed stress and urge urinary incontinence 11/25/2017   Intermittent chest pain 11/25/2017   Allergic rhinitis with a nonallergic component 07/30/2017    Food allergy 07/30/2017   Chronic gout involving toe of left foot without tophus 05/18/2017   Hemorrhoids 05/04/2017   Chronic low back pain 01/03/2017   Chronic cough 06/30/2016   Drug-induced mood disorder (HCC) 06/14/2016   Type 2 diabetes mellitus with diabetic neuropathy (HCC) 05/26/2016   Iron deficiency anemia 05/26/2016   Chronic kidney disease 05/26/2016   HTN (hypertension) 05/26/2016   Tobacco abuse 05/26/2016   History of substance abuse (HCC) 05/26/2016   History of alcohol abuse 05/26/2016   Mild persistent asthma 05/26/2016    Past Surgical History:  Procedure Laterality Date   REPLACEMENT TOTAL KNEE Left    TOE SURGERY         Family History  Adopted: Yes  Problem Relation Age of Onset   Allergic rhinitis Neg Hx    Angioedema Neg Hx    Asthma Neg Hx    Atopy Neg Hx    Eczema Neg Hx    Immunodeficiency Neg Hx    Urticaria Neg Hx     Social History   Tobacco Use   Smoking status: Some Days    Packs/day: 0.30    Years: 45.00    Pack years: 13.50    Types: Cigarettes    Start date: 05/16/1971   Smokeless tobacco: Never   Tobacco comments:    Decreased Intake to 10 cigarettes per week or less.  Vaping Use   Vaping Use: Never used  Substance Use Topics   Alcohol use: No  Drug use: No    Home Medications Prior to Admission medications   Medication Sig Start Date End Date Taking? Authorizing Provider  albuterol (VENTOLIN HFA) 108 (90 Base) MCG/ACT inhaler Inhale 1-2 puffs into the lungs every 4 (four) hours as needed for wheezing or shortness of breath. 10/29/20   Willy Eddy, MD  amLODipine (NORVASC) 10 MG tablet Take 1 tablet (10 mg total) by mouth daily. 10/29/20   Willy Eddy, MD  atorvastatin (LIPITOR) 20 MG tablet Take 1 tablet (20 mg total) by mouth at bedtime. 10/29/20 12/28/20  Willy Eddy, MD  benztropine (COGENTIN) 1 MG tablet Take 1 tablet (1 mg total) by mouth 2 (two) times daily. 10/29/20   Willy Eddy, MD   desmopressin (DDAVP) 0.2 MG tablet Take 1 tablet (0.2 mg total) by mouth at bedtime. 10/29/20   Willy Eddy, MD  ferrous sulfate 325 (65 FE) MG tablet Take 1 tablet (325 mg total) by mouth every other day. 10/29/20   Willy Eddy, MD  gabapentin (NEURONTIN) 300 MG capsule Take 2 capsules (600 mg total) by mouth 3 (three) times daily. 10/29/20 11/28/20  Willy Eddy, MD  insulin aspart (NOVOLOG) 100 UNIT/ML injection Inject 9 Units into the skin 3 (three) times daily with meals. 10/29/20   Willy Eddy, MD  insulin glargine (LANTUS) 100 UNIT/ML injection Inject 0.4 mLs (40 Units total) into the skin daily. 10/29/20   Willy Eddy, MD  lisinopril (ZESTRIL) 10 MG tablet Take 1 tablet (10 mg total) by mouth daily. 10/29/20 11/28/20  Willy Eddy, MD  Melatonin 10 MG TABS Take 10 mg by mouth at bedtime. 10/29/20   Willy Eddy, MD  metformin (FORTAMET) 1000 MG (OSM) 24 hr tablet Take 1 tablet (1,000 mg total) by mouth daily with breakfast. 10/29/20   Willy Eddy, MD  miconazole [facility]) 2 % powder Apply topically in the morning and at bedtime. 11/28/20   Geoffery Lyons, MD  nicotine (NICODERM CQ) 21 mg/24hr patch Place 1 patch (21 mg total) onto the skin daily. 11/28/20   Geoffery Lyons, MD  OLANZapine (ZYPREXA) 10 MG tablet Take 1 tablet (10 mg total) by mouth in the morning and at bedtime. 10/29/20 11/28/20  Willy Eddy, MD  paliperidone (INVEGA) 9 MG 24 hr tablet Take 1 tablet (9 mg total) by mouth daily. 10/29/20   Willy Eddy, MD  pantoprazole (PROTONIX) 40 MG tablet Take 1 tablet (40 mg total) by mouth daily. 10/29/20   Willy Eddy, MD    Allergies    Chantix [varenicline tartrate], Ativan [lorazepam], Other, Pineapple, and Penicillins  Review of Systems   Review of Systems  Constitutional:  Negative for appetite change, diaphoresis, fatigue, fever and unexpected weight change.  HENT:  Negative for mouth sores.   Eyes:  Negative for visual  disturbance.  Respiratory:  Negative for cough, chest tightness, shortness of breath and wheezing.   Cardiovascular:  Negative for chest pain.  Gastrointestinal:  Negative for abdominal pain, constipation, diarrhea, nausea and vomiting.  Endocrine: Negative for polydipsia, polyphagia and polyuria.  Genitourinary:  Negative for dysuria, frequency, hematuria and urgency.  Musculoskeletal:  Negative for back pain and neck stiffness.  Skin:  Negative for rash.  Allergic/Immunologic: Negative for immunocompromised state.  Neurological:  Negative for syncope, light-headedness and headaches.  Hematological:  Does not bruise/bleed easily.  Psychiatric/Behavioral:  Negative for sleep disturbance. The patient is not nervous/anxious.    Physical Exam Updated Vital Signs BP (!) 152/83 (BP Location: Right Arm)   Pulse 89   Temp 98.5  F (36.9 C) (Oral)   Resp 15   Ht 6\' 2"  (1.88 m)   Wt 98 kg   SpO2 100%   BMI 27.73 kg/m   Physical Exam Vitals and nursing note reviewed.  Constitutional:      General: He is not in acute distress.    Appearance: He is well-developed. He is not ill-appearing.  HENT:     Head: Normocephalic.     Nose: Nose normal.  Eyes:     General: No scleral icterus.    Conjunctiva/sclera: Conjunctivae normal.  Cardiovascular:     Rate and Rhythm: Normal rate.  Pulmonary:     Effort: Pulmonary effort is normal.  Abdominal:     General: There is no distension.  Musculoskeletal:        General: Normal range of motion.     Cervical back: Normal range of motion.     Right lower leg: Edema (trace) present.     Left lower leg: Edema (trace) present.     Comments: Full ROM of the bilateral hips, knees, ankles and toes. Normal gait  Skin:    General: Skin is warm and dry.     Capillary Refill: Capillary refill takes less than 2 seconds.     Comments: No open wounds to the feet or lower legs  Neurological:     Mental Status: He is alert.     Comments: Normal  sensation 5/5 strength in the BLE  Psychiatric:        Mood and Affect: Mood normal.    ED Results / Procedures / Treatments   Labs (all labs ordered are listed, but only abnormal results are displayed) Labs Reviewed  CBG MONITORING, ED - Abnormal; Notable for the following components:      Result Value   Glucose-Capillary 181 (*)    All other components within normal limits    Procedures Procedures   Medications Ordered in ED Medications  acetaminophen (TYLENOL) tablet 1,000 mg (has no administration in time range)    ED Course  I have reviewed the triage vital signs and the nursing notes.  Pertinent labs & imaging results that were available during my care of the patient were reviewed by me and considered in my medical decision making (see chart for details).    MDM Rules/Calculators/A&P                          Patient presents with complaints of bilateral foot pain.  This is a chronic and ongoing problem for him.  Recent x-rays with degenerative changes but no acute fractures.  Patient denies trauma.  Ambulates without difficulty.  Feet are without open wounds.  Blood sugar 181 in the ED today. Discharged in good condition.    Final Clinical Impression(s) / ED Diagnoses Final diagnoses:  Bilateral foot pain    Rx / DC Orders ED Discharge Orders     None        Airika Alkhatib, 11/29/20 0124    12/01/20, MD 11/29/20 [phone]

## 2020-11-29 NOTE — Discharge Instructions (Addendum)
1. Medications: Tylenol for pain control, usual home medications 2. Treatment: rest, ice, elevate and use brace, drink plenty of fluids, gentle stretching 3. Follow Up: Please followup with your PCP in 1 week if no improvement for discussion of your diagnoses and further evaluation after today's visit; if you do not have a primary care doctor use the resource guide provided to find one; Please return to the ER for worsening symptoms or other concerns

## 2020-11-29 NOTE — ED Notes (Signed)
RN attempted to review discharge AVS including the [place] number to find a PCP/specialist. Pt is refusing to leave despite being evaluated by Muthersbaugh, PA. Second staff member attempted to get pt to leave. Security notified and at bedside.

## 2020-12-25 ENCOUNTER — Emergency Department (HOSPITAL_COMMUNITY): Payer: Medicaid Other

## 2020-12-25 ENCOUNTER — Other Ambulatory Visit: Payer: Self-pay

## 2020-12-25 ENCOUNTER — Emergency Department (HOSPITAL_COMMUNITY)
Admission: EM | Admit: 2020-12-25 | Discharge: 2020-12-25 | Disposition: A | Discharge status: Home / Self Care | Payer: Medicaid Other | Attending: Emergency Medicine | Admitting: Emergency Medicine

## 2020-12-25 DIAGNOSIS — I129 Hypertensive chronic kidney disease with stage 1 through stage 4 chronic kidney disease, or unspecified chronic kidney disease: Secondary | ICD-10-CM | POA: Diagnosis not present

## 2020-12-25 DIAGNOSIS — Z794 Long term (current) use of insulin: Secondary | ICD-10-CM | POA: Insufficient documentation

## 2020-12-25 DIAGNOSIS — Z7984 Long term (current) use of oral hypoglycemic drugs: Secondary | ICD-10-CM | POA: Insufficient documentation

## 2020-12-25 DIAGNOSIS — J069 Acute upper respiratory infection, unspecified: Secondary | ICD-10-CM | POA: Diagnosis not present

## 2020-12-25 DIAGNOSIS — Z20822 Contact with and (suspected) exposure to covid-19: Secondary | ICD-10-CM | POA: Insufficient documentation

## 2020-12-25 DIAGNOSIS — R059 Cough, unspecified: Secondary | ICD-10-CM | POA: Diagnosis present

## 2020-12-25 DIAGNOSIS — J4530 Mild persistent asthma, uncomplicated: Secondary | ICD-10-CM | POA: Diagnosis not present

## 2020-12-25 DIAGNOSIS — N1831 Chronic kidney disease, stage 3a: Secondary | ICD-10-CM | POA: Insufficient documentation

## 2020-12-25 DIAGNOSIS — Z79899 Other long term (current) drug therapy: Secondary | ICD-10-CM | POA: Diagnosis not present

## 2020-12-25 DIAGNOSIS — Z96652 Presence of left artificial knee joint: Secondary | ICD-10-CM | POA: Diagnosis not present

## 2020-12-25 DIAGNOSIS — E1140 Type 2 diabetes mellitus with diabetic neuropathy, unspecified: Secondary | ICD-10-CM | POA: Diagnosis not present

## 2020-12-25 DIAGNOSIS — E1122 Type 2 diabetes mellitus with diabetic chronic kidney disease: Secondary | ICD-10-CM | POA: Diagnosis not present

## 2020-12-25 DIAGNOSIS — F17210 Nicotine dependence, cigarettes, uncomplicated: Secondary | ICD-10-CM | POA: Insufficient documentation

## 2020-12-25 LAB — RESP PANEL BY RT-PCR (FLU A&B, COVID) ARPGX2
Influenza A by PCR: NEGATIVE
Influenza B by PCR: NEGATIVE
SARS Coronavirus 2 by RT PCR: NEGATIVE

## 2020-12-25 NOTE — ED Provider Notes (Signed)
[facility] EMERGENCY DEPARTMENT Provider Note   CSN: [id number] Arrival date & time: 12/25/20  0251     History No chief complaint on file.   Dylan Jordan is a 65 y.o. male.  Patient to ED with symptoms of cough, congestion, chills/fever, sore throat. Feels to him like when he had COVID in the past. No vomiting. Denies behavioral health issues tonight  The history is provided by the patient. No language interpreter was used.      Past Medical History:  Diagnosis Date   Anemia    Asthma    Diabetes mellitus without complication (HCC)    Gout    Hyperlipidemia    Hyperosmolar hyperglycemic coma due to diabetes mellitus without ketoacidosis (HCC)    Hypertension    Schizophrenia (HCC) 07/06/2018    Patient Active Problem List   Diagnosis Date Noted   Malingering 11/15/2020   Homelessness 09/05/2020   Stage 3a chronic kidney disease (HCC) 04/27/2020   Slow transit constipation 04/27/2020   Noncompliance with diabetes treatment 04/20/2020   Uncontrolled type 2 diabetes mellitus with hyperglycemia (HCC) 04/20/2020   Hyperkalemia, diminished renal excretion 03/29/2020   Hyperkalemia    Schizoaffective disorder, bipolar type (HCC) 02/21/2019   Hallucinations    Anxiety 09/18/2018   Swelling of both lower extremities 09/13/2018   Type 2 diabetes mellitus without complication, with long-term current use of insulin (HCC)    Anemia    Hyperlipidemia 12/18/2017   Mixed stress and urge urinary incontinence 11/25/2017   Intermittent chest pain 11/25/2017   Allergic rhinitis with a nonallergic component 07/30/2017   Food allergy 07/30/2017   Chronic gout involving toe of left foot without tophus 05/18/2017   Hemorrhoids 05/04/2017   Chronic low back pain 01/03/2017   Chronic cough 06/30/2016   Drug-induced mood disorder (HCC) 06/14/2016   Type 2 diabetes mellitus with diabetic neuropathy (HCC) 05/26/2016   Iron deficiency anemia 05/26/2016   Chronic  kidney disease 05/26/2016   HTN (hypertension) 05/26/2016   Tobacco abuse 05/26/2016   History of substance abuse (HCC) 05/26/2016   History of alcohol abuse 05/26/2016   Mild persistent asthma 05/26/2016    Past Surgical History:  Procedure Laterality Date   REPLACEMENT TOTAL KNEE Left    TOE SURGERY         Family History  Adopted: Yes  Problem Relation Age of Onset   Allergic rhinitis Neg Hx    Angioedema Neg Hx    Asthma Neg Hx    Atopy Neg Hx    Eczema Neg Hx    Immunodeficiency Neg Hx    Urticaria Neg Hx     Social History   Tobacco Use   Smoking status: Some Days    Packs/day: 0.30    Years: 45.00    Pack years: 13.50    Types: Cigarettes    Start date: 05/16/1971   Smokeless tobacco: Never   Tobacco comments:    Decreased Intake to 10 cigarettes per week or less.  Vaping Use   Vaping Use: Never used  Substance Use Topics   Alcohol use: No   Drug use: No    Home Medications Prior to Admission medications   Medication Sig Start Date End Date Taking? Authorizing Provider  albuterol (VENTOLIN HFA) 108 (90 Base) MCG/ACT inhaler Inhale 1-2 puffs into the lungs every 4 (four) hours as needed for wheezing or shortness of breath. 10/29/20   Willy Eddy, MD  amLODipine (NORVASC) 10 MG tablet Take 1 tablet (  10 mg total) by mouth daily. 10/29/20   Willy Eddy, MD  atorvastatin (LIPITOR) 20 MG tablet Take 1 tablet (20 mg total) by mouth at bedtime. 10/29/20 12/28/20  Willy Eddy, MD  benztropine (COGENTIN) 1 MG tablet Take 1 tablet (1 mg total) by mouth 2 (two) times daily. 10/29/20   Willy Eddy, MD  desmopressin (DDAVP) 0.2 MG tablet Take 1 tablet (0.2 mg total) by mouth at bedtime. 10/29/20   Willy Eddy, MD  ferrous sulfate 325 (65 FE) MG tablet Take 1 tablet (325 mg total) by mouth every other day. 10/29/20   Willy Eddy, MD  gabapentin (NEURONTIN) 300 MG capsule Take 2 capsules (600 mg total) by mouth 3 (three) times daily. 10/29/20  11/28/20  Willy Eddy, MD  insulin aspart (NOVOLOG) 100 UNIT/ML injection Inject 9 Units into the skin 3 (three) times daily with meals. 10/29/20   Willy Eddy, MD  insulin glargine (LANTUS) 100 UNIT/ML injection Inject 0.4 mLs (40 Units total) into the skin daily. 10/29/20   Willy Eddy, MD  lisinopril (ZESTRIL) 10 MG tablet Take 1 tablet (10 mg total) by mouth daily. 10/29/20 11/28/20  Willy Eddy, MD  Melatonin 10 MG TABS Take 10 mg by mouth at bedtime. 10/29/20   Willy Eddy, MD  metformin (FORTAMET) 1000 MG (OSM) 24 hr tablet Take 1 tablet (1,000 mg total) by mouth daily with breakfast. 10/29/20   Willy Eddy, MD  miconazole [facility]) 2 % powder Apply topically in the morning and at bedtime. 11/28/20   Geoffery Lyons, MD  nicotine (NICODERM CQ) 21 mg/24hr patch Place 1 patch (21 mg total) onto the skin daily. 11/28/20   Geoffery Lyons, MD  OLANZapine (ZYPREXA) 10 MG tablet Take 1 tablet (10 mg total) by mouth in the morning and at bedtime. 10/29/20 11/28/20  Willy Eddy, MD  paliperidone (INVEGA) 9 MG 24 hr tablet Take 1 tablet (9 mg total) by mouth daily. 10/29/20   Willy Eddy, MD  pantoprazole (PROTONIX) 40 MG tablet Take 1 tablet (40 mg total) by mouth daily. 10/29/20   Willy Eddy, MD    Allergies    Chantix [varenicline tartrate], Ativan [lorazepam], Other, Pineapple, and Penicillins  Review of Systems   Review of Systems  Constitutional:  Positive for chills.  HENT:  Positive for rhinorrhea and sore throat.   Respiratory:  Positive for cough. Negative for shortness of breath.   Cardiovascular:  Negative for chest pain.       Chest pain with cough.  Gastrointestinal: Negative.  Negative for nausea and vomiting.  Musculoskeletal:  Positive for myalgias.  Skin: Negative.   Neurological: Negative.  Negative for weakness.   Physical Exam Updated Vital Signs BP (!) 171/89 (BP Location: Left Arm)   Pulse 97   Temp 97.6 F (36.4 C)   Resp  18   SpO2 99%   Physical Exam Vitals and nursing note reviewed.  Constitutional:      Appearance: He is well-developed.  HENT:     Head: Normocephalic.  Cardiovascular:     Rate and Rhythm: Normal rate and regular rhythm.     Heart sounds: No murmur heard. Pulmonary:     Effort: Pulmonary effort is normal.     Breath sounds: Normal breath sounds. No wheezing, rhonchi or rales.  Abdominal:     General: Bowel sounds are normal.     Palpations: Abdomen is soft.     Tenderness: There is no abdominal tenderness. There is no guarding or rebound.  Musculoskeletal:  General: Normal range of motion.     Cervical back: Normal range of motion and neck supple.  Skin:    General: Skin is warm and dry.  Neurological:     General: No focal deficit present.     Mental Status: He is alert and oriented to person, place, and time.    ED Results / Procedures / Treatments   Labs (all labs ordered are listed, but only abnormal results are displayed) Labs Reviewed  RESP PANEL BY RT-PCR (FLU A&B, COVID) [id number]    EKG None  Radiology No results found.  Procedures Procedures   Medications Ordered in ED Medications - No data to display  ED Course  I have reviewed the triage vital signs and the nursing notes.  Pertinent labs & imaging results that were available during my care of the patient were reviewed by me and considered in my medical decision making (see chart for details).    MDM Rules/Calculators/A&P                           Patient to ED with ss/sxs as per HPI. Feels like when he had COVID in the past.   CXR clear. No infection. RVP negative. Patient is appropriate for discharge home with supportive care.   Final Clinical Impression(s) / ED Diagnoses Final diagnoses:  None   URI  Rx / DC Orders ED Discharge Orders     None        Elpidio Anis, PA-C 12/25/20 0500    Nira Conn, MD 12/26/20 2118

## 2020-12-25 NOTE — ED Notes (Signed)
Pt standing up in room messing with the TV at this time. Attempted to help pt however he would not accept help

## 2020-12-25 NOTE — ED Notes (Signed)
This RN in triage room to assess/triage pt. Pt very agitated, stating "damn can you get her out of here please" to tech who was obtaining vital signs. This RN will provide pt adequate time to regain calmness before attempting to triage.

## 2020-12-25 NOTE — ED Notes (Signed)
Attempted to complete an assessment on pt however pt would not answer questions. Made statements about things that did not pertain to the situation and also this RN could barely understand

## 2020-12-25 NOTE — Discharge Instructions (Addendum)
Your COVID test is negative tonight - you are not infected with the COVID-19 virus. Treat your symptoms with Tylenol and or ibuprofen for aches, decongestants if needed. Drink plenty of water to avoid dehydration.

## 2020-12-25 NOTE — ED Provider Notes (Signed)
MSE was initiated and I personally evaluated the patient and placed orders (if any) at  3:30 AM on December 25, 2020.  Patient to ED with symptoms of cough, congestion, chills/fever, sore throat. Feels to him like when he had COVID in the past. No vomiting. Denies behavioral health issues tonight.  Today's Vitals   12/25/20 0257  BP: (!) 171/89  Pulse: 97  Resp: 18  Temp: 97.6 F (36.4 C)  SpO2: 99%   There is no height or weight on file to calculate BMI.  Patient is not ill appearing Frequent use of ED Lungs clear RRR   The patient appears stable so that the remainder of the MSE may be completed by another provider.   Elpidio Anis, PA-C 12/25/20 3570    Marily Memos, MD 12/25/20 0400

## 2020-12-25 NOTE — ED Triage Notes (Signed)
Pt reported to ED with complaints of recently testing positive fort COVID-19 and requesting another COVID-19 Test.

## 2020-12-25 NOTE — ED Notes (Signed)
Pt requesting to have 9 covers and pillow. Pt advised unable to provided that many covers at this time. Pt requesting another nurse

## 2020-12-25 NOTE — ED Notes (Signed)
Pt refused vitals. Provided d/c paperwork and had to be escorted by security out of room at this time

## 2020-12-25 NOTE — ED Notes (Signed)
Pt laying in bed talking to self

## 2020-12-26 ENCOUNTER — Emergency Department (HOSPITAL_COMMUNITY)
Admission: EM | Admit: 2020-12-26 | Discharge: 2020-12-26 | Disposition: A | Discharge status: Home / Self Care | Payer: Medicaid Other | Attending: Emergency Medicine | Admitting: Emergency Medicine

## 2020-12-26 ENCOUNTER — Other Ambulatory Visit: Payer: Self-pay

## 2020-12-26 ENCOUNTER — Encounter (HOSPITAL_COMMUNITY): Payer: Self-pay

## 2020-12-26 DIAGNOSIS — F17210 Nicotine dependence, cigarettes, uncomplicated: Secondary | ICD-10-CM | POA: Insufficient documentation

## 2020-12-26 DIAGNOSIS — Z79899 Other long term (current) drug therapy: Secondary | ICD-10-CM | POA: Diagnosis not present

## 2020-12-26 DIAGNOSIS — I129 Hypertensive chronic kidney disease with stage 1 through stage 4 chronic kidney disease, or unspecified chronic kidney disease: Secondary | ICD-10-CM | POA: Insufficient documentation

## 2020-12-26 DIAGNOSIS — Z7984 Long term (current) use of oral hypoglycemic drugs: Secondary | ICD-10-CM | POA: Insufficient documentation

## 2020-12-26 DIAGNOSIS — Z794 Long term (current) use of insulin: Secondary | ICD-10-CM | POA: Insufficient documentation

## 2020-12-26 DIAGNOSIS — E1122 Type 2 diabetes mellitus with diabetic chronic kidney disease: Secondary | ICD-10-CM | POA: Diagnosis not present

## 2020-12-26 DIAGNOSIS — E1140 Type 2 diabetes mellitus with diabetic neuropathy, unspecified: Secondary | ICD-10-CM | POA: Diagnosis not present

## 2020-12-26 DIAGNOSIS — R456 Violent behavior: Secondary | ICD-10-CM | POA: Insufficient documentation

## 2020-12-26 DIAGNOSIS — J4530 Mild persistent asthma, uncomplicated: Secondary | ICD-10-CM | POA: Insufficient documentation

## 2020-12-26 DIAGNOSIS — Z96652 Presence of left artificial knee joint: Secondary | ICD-10-CM | POA: Diagnosis not present

## 2020-12-26 DIAGNOSIS — N1831 Chronic kidney disease, stage 3a: Secondary | ICD-10-CM | POA: Insufficient documentation

## 2020-12-26 DIAGNOSIS — R451 Restlessness and agitation: Secondary | ICD-10-CM | POA: Insufficient documentation

## 2020-12-26 DIAGNOSIS — Z139 Encounter for screening, unspecified: Secondary | ICD-10-CM | POA: Diagnosis not present

## 2020-12-26 LAB — CBG MONITORING, ED: Glucose-Capillary: 128 mg/dL — ABNORMAL HIGH (ref 70–99)

## 2020-12-26 NOTE — ED Provider Notes (Addendum)
[facility] EMERGENCY DEPARTMENT Provider Note   CSN: [id number] Arrival date & time: 12/26/20  9892     History No chief complaint on file.    Dylan Jordan is a 65 y.o. male with PMHx HTN, HLD, schizophrenia, Diabates, who presents to the ED today escorted by police for "full body physical." [employer] reports they found him in front of a local [employer]. 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 He requested to be taken to [facility] as his religion is baptist. They told him that they could not take him there and then he requested to be seen here for a full body physical. Pt is responding to internal stimuli. He denies SI, HI, or AVH. He was just seen yesterday for bilateral foot pain. 24 ED visits in the past 6 months with hx of homelessness.   The history is provided by the patient, the police and medical records.      Past Medical History:  Diagnosis Date   Anemia    Asthma    Diabetes mellitus without complication (HCC)    Gout    Hyperlipidemia    Hyperosmolar hyperglycemic coma due to diabetes mellitus without ketoacidosis (HCC)    Hypertension    Schizophrenia (HCC) 07/06/2018    Patient Active Problem List   Diagnosis Date Noted   Malingering 11/15/2020   Homelessness 09/05/2020   Stage 3a chronic kidney disease (HCC) 04/27/2020   Slow transit constipation 04/27/2020   Noncompliance with diabetes treatment 04/20/2020   Uncontrolled type 2 diabetes mellitus with hyperglycemia (HCC) 04/20/2020   Hyperkalemia, diminished renal excretion 03/29/2020   Hyperkalemia    Schizoaffective disorder, bipolar type (HCC) 02/21/2019   Hallucinations    Anxiety 09/18/2018   Swelling of both lower extremities 09/13/2018   Type 2 diabetes mellitus without complication, with long-term current use of insulin (HCC)    Anemia    Hyperlipidemia 12/18/2017   Mixed stress and urge urinary incontinence 11/25/2017   Intermittent chest pain 11/25/2017   Allergic  rhinitis with a nonallergic component 07/30/2017   Food allergy 07/30/2017   Chronic gout involving toe of left foot without tophus 05/18/2017   Hemorrhoids 05/04/2017   Chronic low back pain 01/03/2017   Chronic cough 06/30/2016   Drug-induced mood disorder (HCC) 06/14/2016   Type 2 diabetes mellitus with diabetic neuropathy (HCC) 05/26/2016   Iron deficiency anemia 05/26/2016   Chronic kidney disease 05/26/2016   HTN (hypertension) 05/26/2016   Tobacco abuse 05/26/2016   History of substance abuse (HCC) 05/26/2016   History of alcohol abuse 05/26/2016   Mild persistent asthma 05/26/2016    Past Surgical History:  Procedure Laterality Date   REPLACEMENT TOTAL KNEE Left    TOE SURGERY         Family History  Adopted: Yes  Problem Relation Age of Onset   Allergic rhinitis Neg Hx    Angioedema Neg Hx    Asthma Neg Hx    Atopy Neg Hx    Eczema Neg Hx    Immunodeficiency Neg Hx    Urticaria Neg Hx     Social History   Tobacco Use   Smoking status: Some Days    Packs/day: 0.30    Years: 45.00    Pack years: 13.50    Types: Cigarettes    Start date: 05/16/1971   Smokeless tobacco: Never   Tobacco comments:    Decreased Intake to 10 cigarettes per week or less.  Vaping Use  Vaping Use: Never used  Substance Use Topics   Alcohol use: No   Drug use: No    Home Medications Prior to Admission medications   Medication Sig Start Date End Date Taking? Authorizing Provider  albuterol (VENTOLIN HFA) 108 (90 Base) MCG/ACT inhaler Inhale 1-2 puffs into the lungs every 4 (four) hours as needed for wheezing or shortness of breath. 10/29/20   Willy Eddy, MD  amLODipine (NORVASC) 10 MG tablet Take 1 tablet (10 mg total) by mouth daily. 10/29/20   Willy Eddy, MD  atorvastatin (LIPITOR) 20 MG tablet Take 1 tablet (20 mg total) by mouth at bedtime. 10/29/20 12/28/20  Willy Eddy, MD  benztropine (COGENTIN) 1 MG tablet Take 1 tablet (1 mg total) by mouth 2 (two)  times daily. 10/29/20   Willy Eddy, MD  desmopressin (DDAVP) 0.2 MG tablet Take 1 tablet (0.2 mg total) by mouth at bedtime. 10/29/20   Willy Eddy, MD  ferrous sulfate 325 (65 FE) MG tablet Take 1 tablet (325 mg total) by mouth every other day. 10/29/20   Willy Eddy, MD  gabapentin (NEURONTIN) 300 MG capsule Take 2 capsules (600 mg total) by mouth 3 (three) times daily. 10/29/20 11/28/20  Willy Eddy, MD  insulin aspart (NOVOLOG) 100 UNIT/ML injection Inject 9 Units into the skin 3 (three) times daily with meals. 10/29/20   Willy Eddy, MD  insulin glargine (LANTUS) 100 UNIT/ML injection Inject 0.4 mLs (40 Units total) into the skin daily. 10/29/20   Willy Eddy, MD  lisinopril (ZESTRIL) 10 MG tablet Take 1 tablet (10 mg total) by mouth daily. 10/29/20 11/28/20  Willy Eddy, MD  Melatonin 10 MG TABS Take 10 mg by mouth at bedtime. 10/29/20   Willy Eddy, MD  metformin (FORTAMET) 1000 MG (OSM) 24 hr tablet Take 1 tablet (1,000 mg total) by mouth daily with breakfast. 10/29/20   Willy Eddy, MD  miconazole [facility]) 2 % powder Apply topically in the morning and at bedtime. 11/28/20   Geoffery Lyons, MD  nicotine (NICODERM CQ) 21 mg/24hr patch Place 1 patch (21 mg total) onto the skin daily. 11/28/20   Geoffery Lyons, MD  OLANZapine (ZYPREXA) 10 MG tablet Take 1 tablet (10 mg total) by mouth in the morning and at bedtime. 10/29/20 11/28/20  Willy Eddy, MD  paliperidone (INVEGA) 9 MG 24 hr tablet Take 1 tablet (9 mg total) by mouth daily. 10/29/20   Willy Eddy, MD  pantoprazole (PROTONIX) 40 MG tablet Take 1 tablet (40 mg total) by mouth daily. 10/29/20   Willy Eddy, MD    Allergies    Chantix [varenicline tartrate], Ativan [lorazepam], Other, Pineapple, and Penicillins  Review of Systems   Review of Systems  Unable to perform ROS: Psychiatric disorder  Musculoskeletal:  Negative for arthralgias.  Psychiatric/Behavioral:  Negative for  suicidal ideas.    Physical Exam Updated Vital Signs BP (!) 149/114   Pulse (!) 52   Resp 18   SpO2 99%   Physical Exam Vitals and nursing note reviewed.  Constitutional:      Appearance: He is not ill-appearing.  HENT:     Head: Normocephalic and atraumatic.  Eyes:     Conjunctiva/sclera: Conjunctivae normal.  Cardiovascular:     Rate and Rhythm: Normal rate and regular rhythm.  Pulmonary:     Effort: Pulmonary effort is normal.     Breath sounds: Normal breath sounds.  Skin:    General: Skin is warm and dry.     Coloration: Skin is not jaundiced.  Neurological:     Mental Status: He is alert.  Psychiatric:        Mood and Affect: Affect is angry.        Behavior: Behavior is agitated and aggressive.    ED Results / Procedures / Treatments   Labs (all labs ordered are listed, but only abnormal results are displayed) Labs Reviewed  CBG MONITORING, ED - Abnormal; Notable for the following components:      Result Value   Glucose-Capillary 128 (*)    All other components within normal limits    EKG None  Radiology DG Chest 2 View  Result Date: 12/25/2020 CLINICAL DATA:  65 year old male with cough congestion, fever and sore throat. EXAM: CHEST - 2 VIEW COMPARISON:  Chest radiograph 10/17/2020 and earlier. FINDINGS: AP and lateral views of the chest. Lower lung volumes. Mediastinal contours remain within normal limits. Visualized tracheal air column is within normal limits. No pneumothorax, pulmonary edema, pleural effusion or confluent pulmonary opacity. No acute osseous abnormality identified. Increased gas in the stomach, but visible bowel gas pattern is within normal limits. IMPRESSION: Lower lung volumes.  No acute cardiopulmonary abnormality. Electronically Signed   By: Odessa Fleming M.D.   On: 12/25/2020 04:58    Procedures Procedures   Medications Ordered in ED Medications - No data to display  ED Course  I have reviewed the triage vital signs and the nursing  notes.  Pertinent labs & imaging results that were available during my care of the patient were reviewed by me and considered in my medical decision making (see chart for details).    MDM Rules/Calculators/A&P                           65 year old male presenting to the ED for "full body physical." Was brought by [employer] as they found him at a local grocery store. Hx of homelessness and schizophrenia. Pt presented to triage very aggressive and screaming. Stated he needs a "full check up." States he mostly wants his feet checked out as he is a diabetic. CBG 128 in triage. No wounds appreciated on feet. Pt denies SI, HI, or AVH. Feel he can follow up at [facility] for primary care purposes. Do not feel he requires additional workup in the ED today. [employer] reports pt is actually banned from the property and he will be taken to jail once discharged. Stable for discharge at this time.   This note was prepared using Dragon voice recognition software and may include unintentional dictation errors due to the inherent limitations of voice recognition software.   Final Clinical Impression(s) / ED Diagnoses Final diagnoses:  Encounter for medical screening examination    Rx / DC Orders ED Discharge Orders     None          Tanda Rockers, PA-C 12/26/20 1019    Long, Arlyss Repress, MD 12/26/20 1530

## 2020-12-26 NOTE — ED Triage Notes (Signed)
Patient arrived with [employer] and requesting full body exam. Patient yelling and cursing staff that he is diabetic and has bad feet. No open wounds noted and CBG 128. [employer] called back to ED to escort patient from property

## 2021-01-09 ENCOUNTER — Other Ambulatory Visit: Payer: Self-pay

## 2021-01-09 ENCOUNTER — Encounter (HOSPITAL_COMMUNITY): Payer: Self-pay

## 2021-01-09 ENCOUNTER — Emergency Department (HOSPITAL_COMMUNITY)
Admission: EM | Admit: 2021-01-09 | Discharge: 2021-01-10 | Disposition: A | Discharge status: Home / Self Care | Payer: Medicaid Other | Attending: Emergency Medicine | Admitting: Emergency Medicine

## 2021-01-09 DIAGNOSIS — F29 Unspecified psychosis not due to a substance or known physiological condition: Secondary | ICD-10-CM | POA: Diagnosis present

## 2021-01-09 DIAGNOSIS — J45909 Unspecified asthma, uncomplicated: Secondary | ICD-10-CM | POA: Insufficient documentation

## 2021-01-09 DIAGNOSIS — E1122 Type 2 diabetes mellitus with diabetic chronic kidney disease: Secondary | ICD-10-CM | POA: Insufficient documentation

## 2021-01-09 DIAGNOSIS — F209 Schizophrenia, unspecified: Secondary | ICD-10-CM | POA: Insufficient documentation

## 2021-01-09 DIAGNOSIS — N183 Chronic kidney disease, stage 3 unspecified: Secondary | ICD-10-CM | POA: Diagnosis not present

## 2021-01-09 DIAGNOSIS — Z794 Long term (current) use of insulin: Secondary | ICD-10-CM | POA: Diagnosis not present

## 2021-01-09 DIAGNOSIS — B356 Tinea cruris: Secondary | ICD-10-CM | POA: Insufficient documentation

## 2021-01-09 DIAGNOSIS — Z139 Encounter for screening, unspecified: Secondary | ICD-10-CM

## 2021-01-09 DIAGNOSIS — Z20822 Contact with and (suspected) exposure to covid-19: Secondary | ICD-10-CM | POA: Insufficient documentation

## 2021-01-09 DIAGNOSIS — F17210 Nicotine dependence, cigarettes, uncomplicated: Secondary | ICD-10-CM | POA: Diagnosis not present

## 2021-01-09 DIAGNOSIS — I129 Hypertensive chronic kidney disease with stage 1 through stage 4 chronic kidney disease, or unspecified chronic kidney disease: Secondary | ICD-10-CM | POA: Insufficient documentation

## 2021-01-09 DIAGNOSIS — L03818 Cellulitis of other sites: Secondary | ICD-10-CM

## 2021-01-09 LAB — COMPREHENSIVE METABOLIC PANEL
ALT: 22 U/L (ref 0–44)
AST: 25 U/L (ref 15–41)
Albumin: 3.2 g/dL — ABNORMAL LOW (ref 3.5–5.0)
Alkaline Phosphatase: 98 U/L (ref 38–126)
Anion gap: 11 (ref 5–15)
BUN: 17 mg/dL (ref 8–23)
CO2: 21 mmol/L — ABNORMAL LOW (ref 22–32)
Calcium: 9.2 mg/dL (ref 8.9–10.3)
Chloride: 104 mmol/L (ref 98–111)
Creatinine, Ser: 1.43 mg/dL — ABNORMAL HIGH (ref 0.61–1.24)
GFR, Estimated: 55 mL/min — ABNORMAL LOW (ref >60)
Glucose, Bld: 389 mg/dL — ABNORMAL HIGH (ref 70–99)
Potassium: 5.4 mmol/L — ABNORMAL HIGH (ref 3.5–5.1)
Sodium: 136 mmol/L (ref 135–145)
Total Bilirubin: 0.4 mg/dL (ref 0.3–1.2)
Total Protein: 7.2 g/dL (ref 6.5–8.1)

## 2021-01-09 LAB — ETHANOL: Alcohol, Ethyl (B): <10 mg/dL (ref <10)

## 2021-01-09 LAB — ACETAMINOPHEN LEVEL: Acetaminophen (Tylenol), Serum: <10 ug/mL — ABNORMAL LOW (ref 10–30)

## 2021-01-09 LAB — RESP PANEL BY RT-PCR (FLU A&B, COVID) ARPGX2
Influenza A by PCR: NEGATIVE
Influenza B by PCR: NEGATIVE
SARS Coronavirus 2 by RT PCR: NEGATIVE

## 2021-01-09 MED ORDER — ZIPRASIDONE MESYLATE 20 MG IM SOLR: 20.0000 mg | INTRAMUSCULAR | Status: DC | PRN

## 2021-01-09 MED ORDER — ZIPRASIDONE MESYLATE 20 MG IM SOLR: 20.0000 mg | Freq: Once | INTRAMUSCULAR | Status: DC

## 2021-01-09 MED ORDER — OLANZAPINE 5 MG PO TBDP: 5.0000 mg | ORAL_TABLET | Freq: Three times a day (TID) | ORAL | Status: DC | PRN

## 2021-01-09 NOTE — ED Notes (Signed)
Pt requesting to take a shower in the department. This RN made pt aware there is no where foe him to take a shower at this time. Pt then requesting blankets and juice. RN provided. Pt making his bed at this time. Calm and cooperative.

## 2021-01-09 NOTE — ED Notes (Signed)
Pt asked to walk with this RN to a room to be TTS. Pt states that he is too old for that. Pt agreed to walk with RN. Pt then got upset with RN because I was walking to fast. RN slowed down. Pt continue to stop walking and yelling at Rn that she was walking to fast. This RN was only a few feet ahead but keeping distance due to earlier actions. Pt demanding RN to have me call the TTS. Pt made aware that they have to call us.   RN and pt in room waiting for call. Pt is yelling at this RN. Demanding names of everyone here. States, "You don't know how to do your job. You are an idiot. I am taking you to court." Pt is yelling about nonsense things. RN not interacting with pt. Continue to wait for TTS call.

## 2021-01-09 NOTE — ED Notes (Signed)
Pt called RN over to bedside. Pt states "My body is going at accelerated speed." RN asked what I could do to help. Pt states, "I don't know, I am not a doctor." Pt resting comfortably at this time.

## 2021-01-09 NOTE — BH Assessment (Signed)
Clinician messaged Jarome Lamas, RN "Hey its Thurston Pounds with TTS. Is the pt able to be assessed, if so they will need to be placed in a private room, also are they under IVC?"  Clinician awaiting response.  Redmond Pulling, MS, [facility], [facility] Triage Specialist [phone]

## 2021-01-09 NOTE — ED Triage Notes (Signed)
Pt unable to tell this RN why he is here, states "I fell from the sky" pt irritable in triage. Uncooperative. Pt denies SI/HI

## 2021-01-09 NOTE — ED Provider Notes (Signed)
[facility] EMERGENCY DEPARTMENT Provider Note   CSN: [id number] Arrival date & time: 01/09/21  1212     History Chief Complaint  Patient presents with   Psychiatric Evaluation    Rector Devonshire is a 65 y.o. male.  HPI 65 year old male with a history of asthma, DM type II, gout, hyperlipidemia, schizophrenia, with 25 ER visits in the last 6 months presents to the ER for an unknown reason.  Patient is unwilling to tell people why he is here, states he "fell from the sky".  He is refusing to cooperate with my history and physical.  He does not endorse any acute concerns at this time and does not have any pain.  He denies any SI or HI.    Past Medical History:  Diagnosis Date   Anemia    Asthma    Diabetes mellitus without complication (HCC)    Gout    Hyperlipidemia    Hyperosmolar hyperglycemic coma due to diabetes mellitus without ketoacidosis (HCC)    Hypertension    Schizophrenia (HCC) 07/06/2018    Patient Active Problem List   Diagnosis Date Noted   Malingering 11/15/2020   Homelessness 09/05/2020   Stage 3a chronic kidney disease (HCC) 04/27/2020   Slow transit constipation 04/27/2020   Noncompliance with diabetes treatment 04/20/2020   Uncontrolled type 2 diabetes mellitus with hyperglycemia (HCC) 04/20/2020   Hyperkalemia, diminished renal excretion 03/29/2020   Hyperkalemia    Schizoaffective disorder, bipolar type (HCC) 02/21/2019   Hallucinations    Anxiety 09/18/2018   Swelling of both lower extremities 09/13/2018   Type 2 diabetes mellitus without complication, with long-term current use of insulin (HCC)    Anemia    Hyperlipidemia 12/18/2017   Mixed stress and urge urinary incontinence 11/25/2017   Intermittent chest pain 11/25/2017   Allergic rhinitis with a nonallergic component 07/30/2017   Food allergy 07/30/2017   Chronic gout involving toe of left foot without tophus 05/18/2017   Hemorrhoids 05/04/2017   Chronic low back pain  01/03/2017   Chronic cough 06/30/2016   Drug-induced mood disorder (HCC) 06/14/2016   Type 2 diabetes mellitus with diabetic neuropathy (HCC) 05/26/2016   Iron deficiency anemia 05/26/2016   Chronic kidney disease 05/26/2016   HTN (hypertension) 05/26/2016   Tobacco abuse 05/26/2016   History of substance abuse (HCC) 05/26/2016   History of alcohol abuse 05/26/2016   Mild persistent asthma 05/26/2016    Past Surgical History:  Procedure Laterality Date   REPLACEMENT TOTAL KNEE Left    TOE SURGERY         Family History  Adopted: Yes  Problem Relation Age of Onset   Allergic rhinitis Neg Hx    Angioedema Neg Hx    Asthma Neg Hx    Atopy Neg Hx    Eczema Neg Hx    Immunodeficiency Neg Hx    Urticaria Neg Hx     Social History   Tobacco Use   Smoking status: Some Days    Packs/day: 0.30    Years: 45.00    Pack years: 13.50    Types: Cigarettes    Start date: 05/16/1971   Smokeless tobacco: Never   Tobacco comments:    Decreased Intake to 10 cigarettes per week or less.  Vaping Use   Vaping Use: Never used  Substance Use Topics   Alcohol use: No   Drug use: No    Home Medications Prior to Admission medications   Medication Sig Start Date End Date  Taking? Authorizing Provider  albuterol (VENTOLIN HFA) 108 (90 Base) MCG/ACT inhaler Inhale 1-2 puffs into the lungs every 4 (four) hours as needed for wheezing or shortness of breath. 10/29/20   Willy Eddy, MD  amLODipine (NORVASC) 10 MG tablet Take 1 tablet (10 mg total) by mouth daily. 10/29/20   Willy Eddy, MD  atorvastatin (LIPITOR) 20 MG tablet Take 1 tablet (20 mg total) by mouth at bedtime. 10/29/20 12/28/20  Willy Eddy, MD  benztropine (COGENTIN) 1 MG tablet Take 1 tablet (1 mg total) by mouth 2 (two) times daily. 10/29/20   Willy Eddy, MD  desmopressin (DDAVP) 0.2 MG tablet Take 1 tablet (0.2 mg total) by mouth at bedtime. 10/29/20   Willy Eddy, MD  ferrous sulfate 325 (65 FE) MG  tablet Take 1 tablet (325 mg total) by mouth every other day. 10/29/20   Willy Eddy, MD  gabapentin (NEURONTIN) 300 MG capsule Take 2 capsules (600 mg total) by mouth 3 (three) times daily. 10/29/20 11/28/20  Willy Eddy, MD  insulin aspart (NOVOLOG) 100 UNIT/ML injection Inject 9 Units into the skin 3 (three) times daily with meals. 10/29/20   Willy Eddy, MD  insulin glargine (LANTUS) 100 UNIT/ML injection Inject 0.4 mLs (40 Units total) into the skin daily. 10/29/20   Willy Eddy, MD  lisinopril (ZESTRIL) 10 MG tablet Take 1 tablet (10 mg total) by mouth daily. 10/29/20 11/28/20  Willy Eddy, MD  Melatonin 10 MG TABS Take 10 mg by mouth at bedtime. 10/29/20   Willy Eddy, MD  metformin (FORTAMET) 1000 MG (OSM) 24 hr tablet Take 1 tablet (1,000 mg total) by mouth daily with breakfast. 10/29/20   Willy Eddy, MD  miconazole [facility]) 2 % powder Apply topically in the morning and at bedtime. 11/28/20   Geoffery Lyons, MD  nicotine (NICODERM CQ) 21 mg/24hr patch Place 1 patch (21 mg total) onto the skin daily. 11/28/20   Geoffery Lyons, MD  OLANZapine (ZYPREXA) 10 MG tablet Take 1 tablet (10 mg total) by mouth in the morning and at bedtime. 10/29/20 11/28/20  Willy Eddy, MD  paliperidone (INVEGA) 9 MG 24 hr tablet Take 1 tablet (9 mg total) by mouth daily. 10/29/20   Willy Eddy, MD  pantoprazole (PROTONIX) 40 MG tablet Take 1 tablet (40 mg total) by mouth daily. 10/29/20   Willy Eddy, MD    Allergies    Chantix [varenicline tartrate], Ativan [lorazepam], Other, Pineapple, and Penicillins  Review of Systems   Review of Systems  Unable to perform ROS: Psychiatric disorder   Physical Exam Updated Vital Signs BP 136/90 (BP Location: Left Arm)   Pulse (!) 111   Resp 16   SpO2 96%   Physical Exam Vitals reviewed.  Constitutional:      General: He is not in acute distress.    Appearance: Normal appearance. He is not ill-appearing, toxic-appearing  or diaphoretic.  HENT:     Head: Normocephalic and atraumatic.  Eyes:     General:        Right eye: No discharge.        Left eye: No discharge.     Extraocular Movements: Extraocular movements intact.     Conjunctiva/sclera: Conjunctivae normal.  Musculoskeletal:        General: No swelling. Normal range of motion.  Neurological:     General: No focal deficit present.     Mental Status: He is alert and oriented to person, place, and time.  Psychiatric:  Mood and Affect: Mood normal.        Behavior: Behavior normal.    ED Results / Procedures / Treatments   Labs (all labs ordered are listed, but only abnormal results are displayed) Labs Reviewed - No data to display  EKG None  Radiology No results found.  Procedures Procedures   Medications Ordered in ED Medications - No data to display  ED Course  I have reviewed the triage vital signs and the nursing notes.  Pertinent labs & imaging results that were available during my care of the patient were reviewed by me and considered in my medical decision making (see chart for details).    MDM Rules/Calculators/A&P                           65 year old male presents to the ER for unknown reason.  On arrival, he is tachycardic, but this improved throughout the ED course.  Physical exam otherwise unremarkable, soft nontender abdomen, patient is not willing to state why he is here.  Low suspicion for sepsis. States he "fell from the sky".  Does not appear to be responding to external stimuli.  No evidence of focal neuro deficits, no evidence of respiratory distress.  Patient states that he "wants to go home and shower".  I do not think he needs any further work-up at this time. Does not endorse SI/HI.  Return precautions discussed.  Stable for discharge.  This was a shared visit with my supervising physician Dr. Wilkie Aye who independently saw and evaluated the patient & provided guidance in  evaluation/management/disposition ,in agreement with care   Final Clinical Impression(s) / ED Diagnoses Final diagnoses:  Encounter for medical screening examination    Rx / DC Orders ED Discharge Orders     None        Mare Ferrari, PA-C 01/09/21 1440    [name], DO 01/10/21 1539

## 2021-01-09 NOTE — ED Notes (Signed)
RN attempted to discharge pt, pt refusing to leave ED. Security called to bedside, Md Horton at bedside.

## 2021-01-09 NOTE — ED Notes (Signed)
RN attempted to draw pt's labs, pt requested labs to be drawn from R hand, RN explained that there was nothing I could successfully stick in his hand, pt requested new nurse and refused lab draw. MD Schlossman aware

## 2021-01-09 NOTE — ED Notes (Signed)
Pt talking to himself during triage

## 2021-01-09 NOTE — ED Notes (Signed)
Pt request lab draw from hand, RN agreed to try, unable to obtain labs.

## 2021-01-09 NOTE — ED Notes (Signed)
[employer] and security informed nurse that pt was being aggressive and if we let him go he'd be IVCed and brought right back. MD Horton and Schlossman aware

## 2021-01-09 NOTE — ED Notes (Addendum)
Pt refused temperature to be checked

## 2021-01-09 NOTE — BH Assessment (Addendum)
Clinician attempted to engage the pt in TTS assessment however the pt would not answer any questions, pt just looked at clinician, there were no collaterals were presents during assessment.   Clinician expressed the pt did not answer questions during the assessment with Hanley Ben, RN ; clinician asked RN to let her know when the pt is able to be assessed.   Redmond Pulling, MS, [facility], [facility] Triage Specialist [phone]

## 2021-01-09 NOTE — ED Notes (Signed)
Pt threw hot coffee all over bed in direct of RN. RN exited room and called security to bed side for safety purposes. RN and security at bedside awaiting TTS call to being.

## 2021-01-09 NOTE — ED Provider Notes (Signed)
  Physical Exam  BP 136/90 (BP Location: Left Arm)   Pulse (!) 111   Temp 97.9 F (36.6 C) (Oral)   Resp 16   SpO2 96%   Physical Exam  ED Course/Procedures     Procedures  MDM  65 year old male with a history of asthma, type 2 diabetes, gout, hyperlipidemia, schizophrenia, frequent emergency department visit for 25 ER visits in the last 6 months, who presented earlier today to the emergency department with concern that he "fell from the sky."  He had denied other medical concerns at time of this exam, with unclear etiology of his visit to the emergency department.  When nursing attempted to discharge him, he became very angry, aggressive, and when [employer] was called to escort him out, they reported they were concerned regarding his psychotic and aggressive behavior that they would IVC him if he were to leave.  At time of my exam, he is agitated, tangential, and is psychotic.  He denies suicidal or homicidal ideation, but shows a degree of agitation and aggression in setting of psychosis and disconnect from reality that is concerning and feel TTS consult AND IVC at this time is appropriate.  When I asked him why he came to the emergency department, he reports to "get this checked out", pointing to a row of 4 Band-Aids that he placed on his left arm.  When I asked if it was due to the Band-Aids, he reports that there is "something in his blood."    Labs obtained show mild hyperkalemia, similar creatinine to previous, 1.43 from 1.37.  His glucose is mildly elevated.  His CBC has been in process, however per lab needs to be redrawn.  He reports has not been taking his medications.  Given his history of schizophrenia, agitation, aggression, concern for disconnect from reality with his chief complaint today of being falling from the sky and something being in his blood, feel that medication and psychiatric evaluation and treatment is appropriate.  He becomes aggressive whenever discharge is discussed.    On my reevaluation, he does report groin pain.  On exam, he has ulceration of his bilateral medial calves with some very mild surrounding erythema.  This is consistent with likely tinea cruris, with suspected friction ulceration from ambulation and  possible early cellulitis.  History and exam is not consistent with Fournier's gangrene or abscess.    Ordered clotrimazole and doxycycline.  TTS consult pending.  IVC filled out.  Ordered Geodon.      Alvira Monday, MD 01/10/21 [phone]

## 2021-01-10 MED ORDER — GABAPENTIN 100 MG PO CAPS
100.0000 mg | ORAL_CAPSULE | Freq: Once | ORAL | Status: AC
  Administered 2021-01-10: 100 mg via ORAL
  Filled 2021-01-10: qty 1

## 2021-01-10 MED ORDER — INSULIN ASPART 100 UNIT/ML IJ SOLN: 9.0000 [IU] | Freq: Three times a day (TID) | INTRAMUSCULAR | Status: DC

## 2021-01-10 MED ORDER — PALIPERIDONE ER 6 MG PO TB24
9.0000 mg | ORAL_TABLET | Freq: Every day | ORAL | Status: DC
  Administered 2021-01-10: 9 mg via ORAL
  Filled 2021-01-10: qty 1

## 2021-01-10 MED ORDER — INSULIN ASPART 100 UNIT/ML IJ SOLN
9.0000 [IU] | Freq: Three times a day (TID) | INTRAMUSCULAR | Status: DC
Start: 2021-01-10 — End: 2021-01-10

## 2021-01-10 MED ORDER — DOXYCYCLINE HYCLATE 100 MG PO TABS
100.0000 mg | ORAL_TABLET | Freq: Two times a day (BID) | ORAL | Status: DC
  Administered 2021-01-10 (×2): 100 mg via ORAL
  Filled 2021-01-10 (×2): qty 1

## 2021-01-10 MED ORDER — PANTOPRAZOLE SODIUM 40 MG PO TBEC
40.0000 mg | DELAYED_RELEASE_TABLET | Freq: Every day | ORAL | Status: DC
  Administered 2021-01-10: 40 mg via ORAL
  Filled 2021-01-10: qty 1

## 2021-01-10 MED ORDER — AMLODIPINE BESYLATE 5 MG PO TABS
10.0000 mg | ORAL_TABLET | Freq: Every day | ORAL | Status: DC
  Administered 2021-01-10: 10 mg via ORAL
  Filled 2021-01-10: qty 2

## 2021-01-10 MED ORDER — CLOTRIMAZOLE 1 % EX CREA: TOPICAL_CREAM | CUTANEOUS | 0 refills | Status: AC

## 2021-01-10 MED ORDER — DOXYCYCLINE HYCLATE 100 MG PO CAPS: 100.0000 mg | ORAL_CAPSULE | Freq: Two times a day (BID) | ORAL | 0 refills | Status: AC

## 2021-01-10 MED ORDER — INSULIN GLARGINE-YFGN 100 UNIT/ML ~~LOC~~ SOLN
40.0000 [IU] | Freq: Every day | SUBCUTANEOUS | Status: DC
  Administered 2021-01-10: 40 [IU] via SUBCUTANEOUS
  Filled 2021-01-10 (×2): qty 0.4

## 2021-01-10 MED ORDER — CLOTRIMAZOLE 1 % EX CREA
TOPICAL_CREAM | Freq: Two times a day (BID) | CUTANEOUS | Status: DC
  Filled 2021-01-10: qty 15

## 2021-01-10 MED ORDER — ATORVASTATIN CALCIUM 10 MG PO TABS
20.0000 mg | ORAL_TABLET | Freq: Every day | ORAL | Status: DC
  Administered 2021-01-10: 20 mg via ORAL
  Filled 2021-01-10: qty 2

## 2021-01-10 MED ORDER — SODIUM ZIRCONIUM CYCLOSILICATE 10 G PO PACK
10.0000 g | PACK | Freq: Once | ORAL | Status: AC
  Administered 2021-01-10: 10 g via ORAL
  Filled 2021-01-10: qty 1

## 2021-01-10 MED ORDER — BENZTROPINE MESYLATE 1 MG PO TABS
1.0000 mg | ORAL_TABLET | Freq: Two times a day (BID) | ORAL | Status: DC
Start: 2021-01-10 — End: 2021-01-10
  Administered 2021-01-10 (×2): 1 mg via ORAL
  Filled 2021-01-10 (×2): qty 1

## 2021-01-10 MED ORDER — LISINOPRIL 10 MG PO TABS
10.0000 mg | ORAL_TABLET | Freq: Every day | ORAL | Status: DC
Start: 2021-01-10 — End: 2021-01-10
  Administered 2021-01-10: 10 mg via ORAL
  Filled 2021-01-10: qty 1

## 2021-01-10 NOTE — ED Notes (Signed)
IVC paperwork done and handed to nurse 

## 2021-01-10 NOTE — ED Notes (Signed)
Unable to obtain pt belongings. Pt states he isn't changing until he is able to shower. RN unable to take pt to shower room at this time. Will attempt at a later time.

## 2021-01-10 NOTE — BH Assessment (Signed)
Clinician made contact with pt's nurse, Jon Gills RN, at [phone] regarding the possibility of completing pt's MH Assessment. Pt's nurse replied at [phone] that pt is currently asleep and unable to participate at this time. TTS to attempt to complete pt's MH Assessment at a later time.

## 2021-01-10 NOTE — Consult Note (Signed)
  Clinician attempted to engage with patient. Prior to introducing myself, patient was alert and responsive, then immediately upon announcing my department patient closed his eyes and did not speak anymore. This is patients 4th attempt in 24 hours to be assessed, and he does not engage. He appears to be comfortable and rested, drinking coffee and eating while in the ED. At this time suspect patient maybe malingering and presents here for secondary gain. He was recently discharged last week from [facility].   Patient is psych cleared to discharge. -Patient can follow up at [facility] for outpatient services and medication services.

## 2021-01-10 NOTE — ED Provider Notes (Signed)
Patient cleared by psych - patient to be discharged.   Wynetta Fines, MD 01/10/21 1102

## 2021-01-10 NOTE — BH Assessment (Signed)
TTS attempted to assess pt at 0800. Pt is sitting on the side of his bed making coffee. Pt is refusing to participate in the assessment. TTS attempted to establish rapport with pt. TTS asked pt if we could call his sister for collateral but still not answer from pt. RN notified that pt refused assessment and to call TTS if pt wants to participate later today. NP notified.

## 2021-01-10 NOTE — ED Notes (Signed)
Pt refused to let this RN getg discharge vitals. This RN explained to the pt that he was discharged and psych cleared and would have to leave. Pt took discharge papers from this RN ripped them up and stated he was not going anywhere and we could not make him. Security notified. EDP notified. Will discharge out of system and have security escort pt off property.

## 2021-01-10 NOTE — Discharge Instructions (Addendum)
Return for any problem.  ?

## 2021-01-11 ENCOUNTER — Other Ambulatory Visit: Payer: Self-pay

## 2021-01-11 ENCOUNTER — Encounter (HOSPITAL_COMMUNITY): Payer: Self-pay | Admitting: Emergency Medicine

## 2021-01-11 ENCOUNTER — Emergency Department (HOSPITAL_COMMUNITY)
Admission: EM | Admit: 2021-01-11 | Discharge: 2021-01-12 | Disposition: A | Discharge status: Home / Self Care | Payer: Medicaid Other | Attending: Emergency Medicine | Admitting: Emergency Medicine

## 2021-01-11 DIAGNOSIS — W268XXA Contact with other sharp object(s), not elsewhere classified, initial encounter: Secondary | ICD-10-CM | POA: Diagnosis not present

## 2021-01-11 DIAGNOSIS — I129 Hypertensive chronic kidney disease with stage 1 through stage 4 chronic kidney disease, or unspecified chronic kidney disease: Secondary | ICD-10-CM | POA: Diagnosis not present

## 2021-01-11 DIAGNOSIS — N1831 Chronic kidney disease, stage 3a: Secondary | ICD-10-CM | POA: Insufficient documentation

## 2021-01-11 DIAGNOSIS — Z79899 Other long term (current) drug therapy: Secondary | ICD-10-CM | POA: Insufficient documentation

## 2021-01-11 DIAGNOSIS — E1122 Type 2 diabetes mellitus with diabetic chronic kidney disease: Secondary | ICD-10-CM | POA: Diagnosis not present

## 2021-01-11 DIAGNOSIS — Y9389 Activity, other specified: Secondary | ICD-10-CM | POA: Insufficient documentation

## 2021-01-11 DIAGNOSIS — Z96652 Presence of left artificial knee joint: Secondary | ICD-10-CM | POA: Insufficient documentation

## 2021-01-11 DIAGNOSIS — Z880 Allergy status to penicillin: Secondary | ICD-10-CM | POA: Diagnosis not present

## 2021-01-11 DIAGNOSIS — Z7984 Long term (current) use of oral hypoglycemic drugs: Secondary | ICD-10-CM | POA: Diagnosis not present

## 2021-01-11 DIAGNOSIS — Z794 Long term (current) use of insulin: Secondary | ICD-10-CM | POA: Insufficient documentation

## 2021-01-11 DIAGNOSIS — Z23 Encounter for immunization: Secondary | ICD-10-CM | POA: Diagnosis not present

## 2021-01-11 DIAGNOSIS — J4520 Mild intermittent asthma, uncomplicated: Secondary | ICD-10-CM | POA: Insufficient documentation

## 2021-01-11 DIAGNOSIS — S60922A Unspecified superficial injury of left hand, initial encounter: Secondary | ICD-10-CM | POA: Diagnosis present

## 2021-01-11 DIAGNOSIS — F17210 Nicotine dependence, cigarettes, uncomplicated: Secondary | ICD-10-CM | POA: Insufficient documentation

## 2021-01-11 DIAGNOSIS — S61412A Laceration without foreign body of left hand, initial encounter: Secondary | ICD-10-CM | POA: Diagnosis not present

## 2021-01-11 MED ORDER — TETANUS-DIPHTH-ACELL PERTUSSIS 5-2.5-18.5 LF-MCG/0.5 IM SUSY
0.5000 mL | PREFILLED_SYRINGE | Freq: Once | INTRAMUSCULAR | Status: AC
  Administered 2021-01-12: 0.5 mL via INTRAMUSCULAR
  Filled 2021-01-11: qty 0.5

## 2021-01-11 NOTE — ED Triage Notes (Signed)
Pt with laceration to his left hand from a sardine can. Unknown last tetanus.

## 2021-01-11 NOTE — ED Provider Notes (Signed)
Emergency Medicine Provider Triage Evaluation Note  Dylan Jordan , a 65 y.o. male  was evaluated in triage.  Pt complains of a laceration to the left hand that occurred while opening a tuna can.  Review of Systems  Positive: laceration Negative: fever  Physical Exam  There were no vitals taken for this visit. Gen:   Awake, no distress   Resp:  Normal effort  MSK:   Moves extremities without difficulty Other:  1.5cm laceration to the left hand  Medical Decision Making  Medically screening exam initiated at 5:12 PM.  Appropriate orders placed.  Dylan Jordan was informed that the remainder of the evaluation will be completed by another provider, this initial triage assessment does not replace that evaluation, and the importance of remaining in the ED until their evaluation is complete.     [name], PA-C 01/11/21 1713    Melene Plan, DO 01/11/21 2246

## 2021-01-12 ENCOUNTER — Emergency Department (HOSPITAL_COMMUNITY)
Admission: EM | Admit: 2021-01-12 | Discharge: 2021-01-13 | Disposition: A | Discharge status: Home / Self Care | Payer: Medicaid Other | Source: Home / Self Care | Attending: Emergency Medicine | Admitting: Emergency Medicine

## 2021-01-12 ENCOUNTER — Emergency Department (HOSPITAL_COMMUNITY)
Admission: EM | Admit: 2021-01-12 | Discharge: 2021-01-12 | Disposition: A | Discharge status: Home / Self Care | Payer: Medicaid Other

## 2021-01-12 ENCOUNTER — Other Ambulatory Visit: Payer: Self-pay

## 2021-01-12 DIAGNOSIS — E1122 Type 2 diabetes mellitus with diabetic chronic kidney disease: Secondary | ICD-10-CM | POA: Insufficient documentation

## 2021-01-12 DIAGNOSIS — E1165 Type 2 diabetes mellitus with hyperglycemia: Secondary | ICD-10-CM | POA: Insufficient documentation

## 2021-01-12 DIAGNOSIS — R0602 Shortness of breath: Secondary | ICD-10-CM | POA: Insufficient documentation

## 2021-01-12 DIAGNOSIS — J4530 Mild persistent asthma, uncomplicated: Secondary | ICD-10-CM | POA: Insufficient documentation

## 2021-01-12 DIAGNOSIS — Z79899 Other long term (current) drug therapy: Secondary | ICD-10-CM | POA: Insufficient documentation

## 2021-01-12 DIAGNOSIS — E1100 Type 2 diabetes mellitus with hyperosmolarity without nonketotic hyperglycemic-hyperosmolar coma (NKHHC): Secondary | ICD-10-CM | POA: Insufficient documentation

## 2021-01-12 DIAGNOSIS — I129 Hypertensive chronic kidney disease with stage 1 through stage 4 chronic kidney disease, or unspecified chronic kidney disease: Secondary | ICD-10-CM | POA: Insufficient documentation

## 2021-01-12 DIAGNOSIS — E785 Hyperlipidemia, unspecified: Secondary | ICD-10-CM | POA: Insufficient documentation

## 2021-01-12 DIAGNOSIS — N183 Chronic kidney disease, stage 3 unspecified: Secondary | ICD-10-CM | POA: Insufficient documentation

## 2021-01-12 DIAGNOSIS — E1140 Type 2 diabetes mellitus with diabetic neuropathy, unspecified: Secondary | ICD-10-CM | POA: Insufficient documentation

## 2021-01-12 DIAGNOSIS — J45909 Unspecified asthma, uncomplicated: Secondary | ICD-10-CM | POA: Insufficient documentation

## 2021-01-12 DIAGNOSIS — E1169 Type 2 diabetes mellitus with other specified complication: Secondary | ICD-10-CM | POA: Insufficient documentation

## 2021-01-12 DIAGNOSIS — Z794 Long term (current) use of insulin: Secondary | ICD-10-CM | POA: Insufficient documentation

## 2021-01-12 NOTE — ED Provider Notes (Signed)
[facility] EMERGENCY DEPARTMENT Provider Note   CSN: [id number] Arrival date & time: 01/11/21  1706     History Chief Complaint  Patient presents with   Laceration    Dylan Jordan is a 65 y.o. male.  Patient to ED with left hand laceration he reports happened on an aluminum can while opening it today. The patient does not offer any additional information.   The history is provided by the patient. No language interpreter was used.  Laceration     Past Medical History:  Diagnosis Date   Anemia    Asthma    Diabetes mellitus without complication (HCC)    Gout    Hyperlipidemia    Hyperosmolar hyperglycemic coma due to diabetes mellitus without ketoacidosis (HCC)    Hypertension    Schizophrenia (HCC) 07/06/2018    Patient Active Problem List   Diagnosis Date Noted   Malingering 11/15/2020   Homelessness 09/05/2020   Stage 3a chronic kidney disease (HCC) 04/27/2020   Slow transit constipation 04/27/2020   Noncompliance with diabetes treatment 04/20/2020   Uncontrolled type 2 diabetes mellitus with hyperglycemia (HCC) 04/20/2020   Hyperkalemia, diminished renal excretion 03/29/2020   Hyperkalemia    Schizoaffective disorder, bipolar type (HCC) 02/21/2019   Hallucinations    Anxiety 09/18/2018   Swelling of both lower extremities 09/13/2018   Type 2 diabetes mellitus without complication, with long-term current use of insulin (HCC)    Anemia    Hyperlipidemia 12/18/2017   Mixed stress and urge urinary incontinence 11/25/2017   Intermittent chest pain 11/25/2017   Allergic rhinitis with a nonallergic component 07/30/2017   Food allergy 07/30/2017   Chronic gout involving toe of left foot without tophus 05/18/2017   Hemorrhoids 05/04/2017   Chronic low back pain 01/03/2017   Chronic cough 06/30/2016   Drug-induced mood disorder (HCC) 06/14/2016   Type 2 diabetes mellitus with diabetic neuropathy (HCC) 05/26/2016   Iron deficiency anemia  05/26/2016   Chronic kidney disease 05/26/2016   HTN (hypertension) 05/26/2016   Tobacco abuse 05/26/2016   History of substance abuse (HCC) 05/26/2016   History of alcohol abuse 05/26/2016   Mild persistent asthma 05/26/2016    Past Surgical History:  Procedure Laterality Date   REPLACEMENT TOTAL KNEE Left    TOE SURGERY         Family History  Adopted: Yes  Problem Relation Age of Onset   Allergic rhinitis Neg Hx    Angioedema Neg Hx    Asthma Neg Hx    Atopy Neg Hx    Eczema Neg Hx    Immunodeficiency Neg Hx    Urticaria Neg Hx     Social History   Tobacco Use   Smoking status: Some Days    Packs/day: 0.30    Years: 45.00    Pack years: 13.50    Types: Cigarettes    Start date: 05/16/1971   Smokeless tobacco: Never   Tobacco comments:    Decreased Intake to 10 cigarettes per week or less.  Vaping Use   Vaping Use: Never used  Substance Use Topics   Alcohol use: No   Drug use: No    Home Medications Prior to Admission medications   Medication Sig Start Date End Date Taking? Authorizing Provider  albuterol (VENTOLIN HFA) 108 (90 Base) MCG/ACT inhaler Inhale 1-2 puffs into the lungs every 4 (four) hours as needed for wheezing or shortness of breath. 10/29/20   Willy Eddy, MD  amLODipine (NORVASC) 10 MG  tablet Take 1 tablet (10 mg total) by mouth daily. 10/29/20   Willy Eddy, MD  atorvastatin (LIPITOR) 20 MG tablet Take 1 tablet (20 mg total) by mouth at bedtime. 10/29/20 12/28/20  Willy Eddy, MD  benztropine (COGENTIN) 1 MG tablet Take 1 tablet (1 mg total) by mouth 2 (two) times daily. 10/29/20   Willy Eddy, MD  clotrimazole (LOTRIMIN) 1 % cream Apply to affected area 2 times daily 01/10/21   Alvira Monday, MD  desmopressin (DDAVP) 0.2 MG tablet Take 1 tablet (0.2 mg total) by mouth at bedtime. 10/29/20   Willy Eddy, MD  doxycycline (VIBRAMYCIN) 100 MG capsule Take 1 capsule (100 mg total) by mouth 2 (two) times daily for 7 days.  01/10/21 01/17/21  Alvira Monday, MD  ferrous sulfate 325 (65 FE) MG tablet Take 1 tablet (325 mg total) by mouth every other day. 10/29/20   Willy Eddy, MD  gabapentin (NEURONTIN) 300 MG capsule Take 2 capsules (600 mg total) by mouth 3 (three) times daily. 10/29/20 11/28/20  Willy Eddy, MD  insulin aspart (NOVOLOG) 100 UNIT/ML injection Inject 9 Units into the skin 3 (three) times daily with meals. 10/29/20   Willy Eddy, MD  insulin glargine (LANTUS) 100 UNIT/ML injection Inject 0.4 mLs (40 Units total) into the skin daily. 10/29/20   Willy Eddy, MD  lisinopril (ZESTRIL) 10 MG tablet Take 1 tablet (10 mg total) by mouth daily. 10/29/20 11/28/20  Willy Eddy, MD  Melatonin 10 MG TABS Take 10 mg by mouth at bedtime. 10/29/20   Willy Eddy, MD  metformin (FORTAMET) 1000 MG (OSM) 24 hr tablet Take 1 tablet (1,000 mg total) by mouth daily with breakfast. 10/29/20   Willy Eddy, MD  miconazole [facility]) 2 % powder Apply topically in the morning and at bedtime. 11/28/20   Geoffery Lyons, MD  nicotine (NICODERM CQ) 21 mg/24hr patch Place 1 patch (21 mg total) onto the skin daily. 11/28/20   Geoffery Lyons, MD  OLANZapine (ZYPREXA) 10 MG tablet Take 1 tablet (10 mg total) by mouth in the morning and at bedtime. 10/29/20 11/28/20  Willy Eddy, MD  paliperidone (INVEGA) 9 MG 24 hr tablet Take 1 tablet (9 mg total) by mouth daily. 10/29/20   Willy Eddy, MD  pantoprazole (PROTONIX) 40 MG tablet Take 1 tablet (40 mg total) by mouth daily. 10/29/20   Willy Eddy, MD    Allergies    Chantix [varenicline tartrate], Ativan [lorazepam], Other, Pineapple, and Penicillins  Review of Systems   Review of Systems  Skin:  Positive for wound.   Physical Exam Updated Vital Signs BP 118/78   Pulse 78   Temp 98.1 F (36.7 C) (Oral)   Resp 16   SpO2 100%   Physical Exam Vitals and nursing note reviewed.  Constitutional:      General: He is not in acute  distress. Pulmonary:     Effort: Pulmonary effort is normal.  Musculoskeletal:        General: Normal range of motion.  Skin:    General: Skin is warm and dry.  Neurological:     General: No focal deficit present.     Coordination: Coordination normal.    ED Results / Procedures / Treatments   Labs (all labs ordered are listed, but only abnormal results are displayed) Labs Reviewed - No data to display  EKG None  Radiology No results found.  Procedures Procedures   Medications Ordered in ED Medications  Tdap (BOOSTRIX) injection 0.5 mL (0.5 mLs Intramuscular  Given 01/12/21 0216)    ED Course  I have reviewed the triage vital signs and the nursing notes.  Pertinent labs & imaging results that were available during my care of the patient were reviewed by me and considered in my medical decision making (see chart for details).    MDM Rules/Calculators/A&P                           The patient does not allow a physical exam. He states he doesn't want me to evaluate the wound or repair it if it needs repairing. "I want someone else".   Wound evaluated and treated by Dierdre Forth, PA-C. Tetanus updated.    Final Clinical Impression(s) / ED Diagnoses Final diagnoses:  None   Left hand laceration  Rx / DC Orders ED Discharge Orders     None        Danne Harbor 01/12/21 0353    Dione Booze, MD 01/12/21 2250

## 2021-01-12 NOTE — Discharge Instructions (Addendum)
Follow up with your doctor for recheck of the wound in 5 days.

## 2021-01-12 NOTE — ED Triage Notes (Signed)
BIB EMS  Was picked up [facility] of jail where pt stated he had just been released. Pt stated he was [facility]

## 2021-01-12 NOTE — ED Triage Notes (Signed)
Pt checked in w c/c of "groin pain." [employer]/Security to escort pt out & off of property

## 2021-01-12 NOTE — ED Provider Notes (Signed)
..  Laceration Repair  Date/Time: 01/12/2021 3:51 AM Performed by: Dierdre Forth, PA-C Authorized by: Dierdre Forth, PA-C   Consent:    Consent obtained:  Verbal   Consent given by:  Patient   Risks discussed:  Infection, need for additional repair, pain, poor cosmetic result and poor wound healing   Alternatives discussed:  No treatment and delayed treatment Universal protocol:    Procedure explained and questions answered to patient or proxy's satisfaction: yes     Relevant documents present and verified: yes     Test results available: yes     Imaging studies available: yes     Required blood products, implants, devices, and special equipment available: yes     Site/side marked: yes     Immediately prior to procedure, a time out was called: yes     Patient identity confirmed:  Verbally with patient Laceration details:    Location:  Hand   Hand location:  L palm   Length (cm):  2 Pre-procedure details:    Preparation:  Patient was prepped and draped in usual sterile fashion Exploration:    Hemostasis achieved with:  Direct pressure   Wound exploration: wound explored through full range of motion and entire depth of wound visualized   Treatment:    Area cleansed with:  Saline   Amount of cleaning:  Standard   Irrigation solution:  Sterile water   Irrigation volume:    Irrigation method:  Syringe Skin repair:    Repair method:  Steri-Strips (derma-clip)   Number of Steri-Strips:  2 Approximation:    Approximation:  Loose Repair type:    Repair type:  Simple Post-procedure details:    Dressing:  Non-adherent dressing   Procedure completion:  Levada Schilling, PA-C 01/12/21 0353    Dione Booze, MD 01/12/21 2250

## 2021-01-12 NOTE — ED Notes (Signed)
Pt asking this RN what time it is. Pt states that this RN is lying to him about what time it is. Pt requesting breakfast and ginger ale.

## 2021-01-13 NOTE — ED Notes (Signed)
Pt is medically cleared and discharged from the ED

## 2021-01-13 NOTE — ED Notes (Signed)
Pt refusing to leave and tore up his discharge papers, [employer] here to help patient off the property

## 2021-01-13 NOTE — ED Provider Notes (Signed)
[facility]-EMERGENCY DEPT Provider Note   CSN: [id number] Arrival date & time: 01/12/21  2335     History Chief Complaint  Patient presents with   Shortness of Breath    Dylan Jordan is a 65 y.o. male.  The history is provided by the patient.  Shortness of Breath Severity:  Mild Timing:  Constant Relieved by:  Nothing Worsened by:  Nothing Associated symptoms: no fever   Patient presents with shortness of breath.  Patient apparently was released from jail and immediately called ambulance for evaluation.  Patient reports he may be having a heart attack    Past Medical History:  Diagnosis Date   Anemia    Asthma    Diabetes mellitus without complication (HCC)    Gout    Hyperlipidemia    Hyperosmolar hyperglycemic coma due to diabetes mellitus without ketoacidosis (HCC)    Hypertension    Schizophrenia (HCC) 07/06/2018    Patient Active Problem List   Diagnosis Date Noted   Malingering 11/15/2020   Homelessness 09/05/2020   Stage 3a chronic kidney disease (HCC) 04/27/2020   Slow transit constipation 04/27/2020   Noncompliance with diabetes treatment 04/20/2020   Uncontrolled type 2 diabetes mellitus with hyperglycemia (HCC) 04/20/2020   Hyperkalemia, diminished renal excretion 03/29/2020   Hyperkalemia    Schizoaffective disorder, bipolar type (HCC) 02/21/2019   Hallucinations    Anxiety 09/18/2018   Swelling of both lower extremities 09/13/2018   Type 2 diabetes mellitus without complication, with long-term current use of insulin (HCC)    Anemia    Hyperlipidemia 12/18/2017   Mixed stress and urge urinary incontinence 11/25/2017   Intermittent chest pain 11/25/2017   Allergic rhinitis with a nonallergic component 07/30/2017   Food allergy 07/30/2017   Chronic gout involving toe of left foot without tophus 05/18/2017   Hemorrhoids 05/04/2017   Chronic low back pain 01/03/2017   Chronic cough 06/30/2016   Drug-induced mood disorder  (HCC) 06/14/2016   Type 2 diabetes mellitus with diabetic neuropathy (HCC) 05/26/2016   Iron deficiency anemia 05/26/2016   Chronic kidney disease 05/26/2016   HTN (hypertension) 05/26/2016   Tobacco abuse 05/26/2016   History of substance abuse (HCC) 05/26/2016   History of alcohol abuse 05/26/2016   Mild persistent asthma 05/26/2016    Past Surgical History:  Procedure Laterality Date   REPLACEMENT TOTAL KNEE Left    TOE SURGERY         Family History  Adopted: Yes  Problem Relation Age of Onset   Allergic rhinitis Neg Hx    Angioedema Neg Hx    Asthma Neg Hx    Atopy Neg Hx    Eczema Neg Hx    Immunodeficiency Neg Hx    Urticaria Neg Hx     Social History   Tobacco Use   Smoking status: Some Days    Packs/day: 0.30    Years: 45.00    Pack years: 13.50    Types: Cigarettes    Start date: 05/16/1971   Smokeless tobacco: Never   Tobacco comments:    Decreased Intake to 10 cigarettes per week or less.  Vaping Use   Vaping Use: Never used  Substance Use Topics   Alcohol use: No   Drug use: No    Home Medications Prior to Admission medications   Medication Sig Start Date End Date Taking? Authorizing Provider  albuterol (VENTOLIN HFA) 108 (90 Base) MCG/ACT inhaler Inhale 1-2 puffs into the lungs every 4 (four) hours as  needed for wheezing or shortness of breath. 10/29/20   Willy Eddy, MD  amLODipine (NORVASC) 10 MG tablet Take 1 tablet (10 mg total) by mouth daily. 10/29/20   Willy Eddy, MD  atorvastatin (LIPITOR) 20 MG tablet Take 1 tablet (20 mg total) by mouth at bedtime. 10/29/20 12/28/20  Willy Eddy, MD  benztropine (COGENTIN) 1 MG tablet Take 1 tablet (1 mg total) by mouth 2 (two) times daily. 10/29/20   Willy Eddy, MD  clotrimazole (LOTRIMIN) 1 % cream Apply to affected area 2 times daily 01/10/21   Alvira Monday, MD  desmopressin (DDAVP) 0.2 MG tablet Take 1 tablet (0.2 mg total) by mouth at bedtime. 10/29/20   Willy Eddy, MD   doxycycline (VIBRAMYCIN) 100 MG capsule Take 1 capsule (100 mg total) by mouth 2 (two) times daily for 7 days. 01/10/21 01/17/21  Alvira Monday, MD  ferrous sulfate 325 (65 FE) MG tablet Take 1 tablet (325 mg total) by mouth every other day. 10/29/20   Willy Eddy, MD  gabapentin (NEURONTIN) 300 MG capsule Take 2 capsules (600 mg total) by mouth 3 (three) times daily. 10/29/20 11/28/20  Willy Eddy, MD  insulin aspart (NOVOLOG) 100 UNIT/ML injection Inject 9 Units into the skin 3 (three) times daily with meals. 10/29/20   Willy Eddy, MD  insulin glargine (LANTUS) 100 UNIT/ML injection Inject 0.4 mLs (40 Units total) into the skin daily. 10/29/20   Willy Eddy, MD  lisinopril (ZESTRIL) 10 MG tablet Take 1 tablet (10 mg total) by mouth daily. 10/29/20 11/28/20  Willy Eddy, MD  Melatonin 10 MG TABS Take 10 mg by mouth at bedtime. 10/29/20   Willy Eddy, MD  metformin (FORTAMET) 1000 MG (OSM) 24 hr tablet Take 1 tablet (1,000 mg total) by mouth daily with breakfast. 10/29/20   Willy Eddy, MD  miconazole [facility]) 2 % powder Apply topically in the morning and at bedtime. 11/28/20   Geoffery Lyons, MD  nicotine (NICODERM CQ) 21 mg/24hr patch Place 1 patch (21 mg total) onto the skin daily. 11/28/20   Geoffery Lyons, MD  OLANZapine (ZYPREXA) 10 MG tablet Take 1 tablet (10 mg total) by mouth in the morning and at bedtime. 10/29/20 11/28/20  Willy Eddy, MD  paliperidone (INVEGA) 9 MG 24 hr tablet Take 1 tablet (9 mg total) by mouth daily. 10/29/20   Willy Eddy, MD  pantoprazole (PROTONIX) 40 MG tablet Take 1 tablet (40 mg total) by mouth daily. 10/29/20   Willy Eddy, MD    Allergies    Chantix [varenicline tartrate], Ativan [lorazepam], Other, Pineapple, and Penicillins  Review of Systems   Review of Systems  Constitutional:  Negative for fever.  Respiratory:  Positive for shortness of breath.    Physical Exam Updated Vital Signs BP 132/77 (BP  Location: Right Arm)   Pulse 70   Temp 98.4 F (36.9 C) (Oral)   Resp 14   SpO2 100%   Physical Exam CONSTITUTIONAL: Disheveled, anxious HEAD: Normocephalic/atraumatic ENMT: Mucous membranes moist CV: S1/S2 noted, no murmurs/rubs/gallops noted LUNGS: Lungs are clear to auscultation bilaterally, no apparent distress ABDOMEN: soft NEURO: Pt is awake/alert/appropriate, moves all extremitiesx4.  No facial droop.  Patient is ambulatory without any difficulty EXTREMITIES: full ROM, minimal edema noted lower extremities SKIN: warm, color normal PSYCH: Anxious and mildly agitated  ED Results / Procedures / Treatments   Labs (all labs ordered are listed, but only abnormal results are displayed) Labs Reviewed - No data to display  EKG EKG Interpretation  Date/Time:  Thursday  January 13 2021 03:15:49 EDT Ventricular Rate:  79 PR Interval:  192 QRS Duration: 78 QT Interval:  382 QTC Calculation: 438 R Axis:   12 Text Interpretation: Sinus rhythm with Premature supraventricular complexes Possible Left atrial enlargement Borderline ECG Confirmed by Zadie Rhine ([id number]) on 01/13/2021 3:21:17 AM  Radiology No results found.  Procedures Procedures   Medications Ordered in ED Medications - No data to display  ED Course  I have reviewed the triage vital signs and the nursing notes.   MDM Rules/Calculators/A&P                           Patient presents for 28th ER visit in 6 months. Patient apparently was picked up at the jail and brought here for shortness of breath.  His vitals are appropriate and he is in no acute distress.  While waiting in the ER waiting room, patient became aggressive and agitated towards other patients. On my  evaluation patient walked to his chair without any difficulty.  He was in no acute distress.  Room air pulse ox 100%.  Lung sounds were clear and equal bilaterally.  EKG was reviewed and is unremarkable. No further work-up is indicated at this  time and he can be discharged Final Clinical Impression(s) / ED Diagnoses Final diagnoses:  Shortness of breath    Rx / DC Orders ED Discharge Orders     None        Zadie Rhine, MD 01/13/21 [phone]

## 2021-01-14 ENCOUNTER — Emergency Department (HOSPITAL_COMMUNITY)
Admission: EM | Admit: 2021-01-14 | Discharge: 2021-01-15 | Disposition: A | Discharge status: Home / Self Care | Payer: Medicaid Other | Attending: Emergency Medicine | Admitting: Emergency Medicine

## 2021-01-14 DIAGNOSIS — S91102A Unspecified open wound of left great toe without damage to nail, initial encounter: Secondary | ICD-10-CM | POA: Insufficient documentation

## 2021-01-14 DIAGNOSIS — E1122 Type 2 diabetes mellitus with diabetic chronic kidney disease: Secondary | ICD-10-CM | POA: Insufficient documentation

## 2021-01-14 DIAGNOSIS — S61412A Laceration without foreign body of left hand, initial encounter: Secondary | ICD-10-CM | POA: Insufficient documentation

## 2021-01-14 DIAGNOSIS — Z7984 Long term (current) use of oral hypoglycemic drugs: Secondary | ICD-10-CM | POA: Insufficient documentation

## 2021-01-14 DIAGNOSIS — J4530 Mild persistent asthma, uncomplicated: Secondary | ICD-10-CM | POA: Insufficient documentation

## 2021-01-14 DIAGNOSIS — S61412D Laceration without foreign body of left hand, subsequent encounter: Secondary | ICD-10-CM

## 2021-01-14 DIAGNOSIS — F17210 Nicotine dependence, cigarettes, uncomplicated: Secondary | ICD-10-CM | POA: Insufficient documentation

## 2021-01-14 DIAGNOSIS — X58XXXA Exposure to other specified factors, initial encounter: Secondary | ICD-10-CM | POA: Insufficient documentation

## 2021-01-14 DIAGNOSIS — Z5900 Homelessness unspecified: Secondary | ICD-10-CM

## 2021-01-14 DIAGNOSIS — Z794 Long term (current) use of insulin: Secondary | ICD-10-CM | POA: Insufficient documentation

## 2021-01-14 DIAGNOSIS — Z96652 Presence of left artificial knee joint: Secondary | ICD-10-CM | POA: Diagnosis not present

## 2021-01-14 DIAGNOSIS — Z79899 Other long term (current) drug therapy: Secondary | ICD-10-CM | POA: Insufficient documentation

## 2021-01-14 DIAGNOSIS — I129 Hypertensive chronic kidney disease with stage 1 through stage 4 chronic kidney disease, or unspecified chronic kidney disease: Secondary | ICD-10-CM | POA: Diagnosis not present

## 2021-01-14 DIAGNOSIS — Y92512 Supermarket, store or market as the place of occurrence of the external cause: Secondary | ICD-10-CM | POA: Insufficient documentation

## 2021-01-14 DIAGNOSIS — N1831 Chronic kidney disease, stage 3a: Secondary | ICD-10-CM | POA: Insufficient documentation

## 2021-01-14 NOTE — ED Triage Notes (Signed)
[facility] came from [employer] [facility]. Patient is homeless. Patient was kicked out of the [employer]. Patient laid on the floor and ask someone to call the ambulance.

## 2021-01-14 NOTE — ED Provider Notes (Signed)
Emergency Medicine Provider Triage Evaluation Note  Dylan Jordan , a 65 y.o. male  was evaluated in triage.  Pt complains of syncope.  Review of Systems  Positive: syncope Negative: Level V caveat  Physical Exam  BP (!) 146/95 (BP Location: Left Arm)   Pulse 75   Temp 98.6 F (37 C) (Oral)   Resp 17   SpO2 98%  Gen:   Eyes closed, not participating in conversation Resp:  Normal effort  MSK:   Moves extremities without difficulty  Other:    Medical Decision Making  Medically screening exam initiated at 11:54 PM.  Appropriate orders placed.  Awab Abebe was informed that the remainder of the evaluation will be completed by another provider, this initial triage assessment does not replace that evaluation, and the importance of remaining in the ED until their evaluation is complete.  EMS report that patient was found laying in front of a store.  The personnel at the store mention the patient was running around in the store throwing things and was kicked out of the store.  He then went outside and lay on the ground.  He did talk to EMS and request to be brought to [facility].  Does have history of schizophrenia.  Patient is not participating in my conversation with him.   Fayrene Helper, PA-C 01/14/21 2357    Alvira Monday, MD 01/15/21 [phone]

## 2021-01-15 NOTE — Discharge Instructions (Addendum)
Thank you for allowing me to care for you today in the Emergency Department.   Change the dressing on your left hand regularly.  You should especially change it if it gets wet.  Follow-up with [facility] to recheck the wound on your toe.  You were given a blister pad today.   You should return to the emergency department if you develop significant redness, swelling of the feet, red streaking, purulent drainage, fever, chills, or other new, concerning symptoms.

## 2021-01-15 NOTE — ED Notes (Addendum)
Patient was requesting to take a shower. He was provided with some washcloths, soapy water, and clean clothes to get himself cleaned up.

## 2021-01-15 NOTE — ED Provider Notes (Signed)
[facility]-EMERGENCY DEPT Provider Note   CSN: [id number] Arrival date & time: 01/14/21  2334     History No chief complaint on file.   Dylan Jordan is a 65 y.o. male who is well-known to this emergency department with 29 visits in the last 6 months.  Patient was brought in by EMS after the patient was found laying in front of a store.  Staff at the store reports that the patient was running around and throwing things and was kicked out of the store.  He then went outside and laid down on the ground.  He spoke with EMS when they arrived he requested to be brought to [facility].  The patient states that he passed out earlier today when he was at the store. (EMS reports this is when he laid down out on the ground outside the store.)  The patient reports he has also been vomiting, and is concerned he may be dehydrated.  He would not quantify how many episodes or how long.  He is also endorsing bilateral foot pain and would like to have his feet evaluated today.  He has a dressing on his left hand that he states is from a laceration last week.  Reports he has not changed the dressing since it was applied in the emergency department.  No fever, chills, chest pain, shortness of breath, abdominal pain, decreased urinary output, dysuria, urinary frequency or hesitancy, or other complaints.  The history is provided by the patient and medical records. No language interpreter was used.      Past Medical History:  Diagnosis Date   Anemia    Asthma    Diabetes mellitus without complication (HCC)    Gout    Hyperlipidemia    Hyperosmolar hyperglycemic coma due to diabetes mellitus without ketoacidosis (HCC)    Hypertension    Schizophrenia (HCC) 07/06/2018    Patient Active Problem List   Diagnosis Date Noted   Malingering 11/15/2020   Homelessness 09/05/2020   Stage 3a chronic kidney disease (HCC) 04/27/2020   Slow transit constipation 04/27/2020   Noncompliance with  diabetes treatment 04/20/2020   Uncontrolled type 2 diabetes mellitus with hyperglycemia (HCC) 04/20/2020   Hyperkalemia, diminished renal excretion 03/29/2020   Hyperkalemia    Schizoaffective disorder, bipolar type (HCC) 02/21/2019   Hallucinations    Anxiety 09/18/2018   Swelling of both lower extremities 09/13/2018   Type 2 diabetes mellitus without complication, with long-term current use of insulin (HCC)    Anemia    Hyperlipidemia 12/18/2017   Mixed stress and urge urinary incontinence 11/25/2017   Intermittent chest pain 11/25/2017   Allergic rhinitis with a nonallergic component 07/30/2017   Food allergy 07/30/2017   Chronic gout involving toe of left foot without tophus 05/18/2017   Hemorrhoids 05/04/2017   Chronic low back pain 01/03/2017   Chronic cough 06/30/2016   Drug-induced mood disorder (HCC) 06/14/2016   Type 2 diabetes mellitus with diabetic neuropathy (HCC) 05/26/2016   Iron deficiency anemia 05/26/2016   Chronic kidney disease 05/26/2016   HTN (hypertension) 05/26/2016   Tobacco abuse 05/26/2016   History of substance abuse (HCC) 05/26/2016   History of alcohol abuse 05/26/2016   Mild persistent asthma 05/26/2016    Past Surgical History:  Procedure Laterality Date   REPLACEMENT TOTAL KNEE Left    TOE SURGERY         Family History  Adopted: Yes  Problem Relation Age of Onset   Allergic rhinitis Neg Hx  Angioedema Neg Hx    Asthma Neg Hx    Atopy Neg Hx    Eczema Neg Hx    Immunodeficiency Neg Hx    Urticaria Neg Hx     Social History   Tobacco Use   Smoking status: Some Days    Packs/day: 0.30    Years: 45.00    Pack years: 13.50    Types: Cigarettes    Start date: 05/16/1971   Smokeless tobacco: Never   Tobacco comments:    Decreased Intake to 10 cigarettes per week or less.  Vaping Use   Vaping Use: Never used  Substance Use Topics   Alcohol use: No   Drug use: No    Home Medications Prior to Admission medications    Medication Sig Start Date End Date Taking? Authorizing Provider  albuterol (VENTOLIN HFA) 108 (90 Base) MCG/ACT inhaler Inhale 1-2 puffs into the lungs every 4 (four) hours as needed for wheezing or shortness of breath. 10/29/20   Willy Eddyobinson, Patrick, MD  amLODipine (NORVASC) 10 MG tablet Take 1 tablet (10 mg total) by mouth daily. 10/29/20   Willy Eddyobinson, Patrick, MD  atorvastatin (LIPITOR) 20 MG tablet Take 1 tablet (20 mg total) by mouth at bedtime. 10/29/20 12/28/20  Willy Eddyobinson, Patrick, MD  benztropine (COGENTIN) 1 MG tablet Take 1 tablet (1 mg total) by mouth 2 (two) times daily. 10/29/20   Willy Eddyobinson, Patrick, MD  clotrimazole (LOTRIMIN) 1 % cream Apply to affected area 2 times daily 01/10/21   Alvira MondaySchlossman, Erin, MD  desmopressin (DDAVP) 0.2 MG tablet Take 1 tablet (0.2 mg total) by mouth at bedtime. 10/29/20   Willy Eddyobinson, Patrick, MD  doxycycline (VIBRAMYCIN) 100 MG capsule Take 1 capsule (100 mg total) by mouth 2 (two) times daily for 7 days. 01/10/21 01/17/21  Alvira MondaySchlossman, Erin, MD  ferrous sulfate 325 (65 FE) MG tablet Take 1 tablet (325 mg total) by mouth every other day. 10/29/20   Willy Eddyobinson, Patrick, MD  gabapentin (NEURONTIN) 300 MG capsule Take 2 capsules (600 mg total) by mouth 3 (three) times daily. 10/29/20 11/28/20  Willy Eddyobinson, Patrick, MD  insulin aspart (NOVOLOG) 100 UNIT/ML injection Inject 9 Units into the skin 3 (three) times daily with meals. 10/29/20   Willy Eddyobinson, Patrick, MD  insulin glargine (LANTUS) 100 UNIT/ML injection Inject 0.4 mLs (40 Units total) into the skin daily. 10/29/20   Willy Eddyobinson, Patrick, MD  lisinopril (ZESTRIL) 10 MG tablet Take 1 tablet (10 mg total) by mouth daily. 10/29/20 11/28/20  Willy Eddyobinson, Patrick, MD  Melatonin 10 MG TABS Take 10 mg by mouth at bedtime. 10/29/20   Willy Eddyobinson, Patrick, MD  metformin (FORTAMET) 1000 MG (OSM) 24 hr tablet Take 1 tablet (1,000 mg total) by mouth daily with breakfast. 10/29/20   Willy Eddyobinson, Patrick, MD  miconazole [facility](DESENEX) 2 % powder Apply topically in the  morning and at bedtime. 11/28/20   Geoffery Lyonselo, Douglas, MD  nicotine (NICODERM CQ) 21 mg/24hr patch Place 1 patch (21 mg total) onto the skin daily. 11/28/20   Geoffery Lyonselo, Douglas, MD  OLANZapine (ZYPREXA) 10 MG tablet Take 1 tablet (10 mg total) by mouth in the morning and at bedtime. 10/29/20 11/28/20  Willy Eddyobinson, Patrick, MD  paliperidone (INVEGA) 9 MG 24 hr tablet Take 1 tablet (9 mg total) by mouth daily. 10/29/20   Willy Eddyobinson, Patrick, MD  pantoprazole (PROTONIX) 40 MG tablet Take 1 tablet (40 mg total) by mouth daily. 10/29/20   Willy Eddyobinson, Patrick, MD    Allergies    Chantix [varenicline tartrate], Ativan [lorazepam], Ibuprofen, Other, Pineapple,  and Penicillins  Review of Systems   Review of Systems  Constitutional:  Negative for appetite change, chills, diaphoresis and fever.  HENT:  Negative for congestion and sore throat.   Respiratory:  Negative for shortness of breath.   Cardiovascular:  Negative for chest pain.  Gastrointestinal:  Positive for vomiting. Negative for abdominal pain, diarrhea and nausea.  Genitourinary:  Negative for dysuria.  Musculoskeletal:  Negative for back pain.  Skin:  Positive for wound. Negative for rash.  Allergic/Immunologic: Negative for immunocompromised state.  Neurological:  Negative for dizziness, seizures, syncope, weakness, numbness and headaches.  Psychiatric/Behavioral:  Negative for confusion, hallucinations, self-injury and suicidal ideas. The patient is not hyperactive.    Physical Exam Updated Vital Signs BP 136/76 (BP Location: Left Arm)   Pulse 65   Temp 98.2 F (36.8 C) (Oral)   Resp 16   SpO2 98%   Physical Exam Vitals and nursing note reviewed.  Constitutional:      General: He is not in acute distress.    Appearance: He is well-developed. He is not ill-appearing, toxic-appearing or diaphoretic.  HENT:     Head: Normocephalic.     Mouth/Throat:     Mouth: Mucous membranes are moist.  Eyes:     Conjunctiva/sclera: Conjunctivae normal.   Cardiovascular:     Rate and Rhythm: Normal rate and regular rhythm.     Heart sounds: No murmur heard. Pulmonary:     Effort: Pulmonary effort is normal. No respiratory distress.     Breath sounds: No stridor. No wheezing, rhonchi or rales.  Chest:     Chest wall: No tenderness.  Abdominal:     General: There is no distension.     Palpations: Abdomen is soft. There is no mass.     Tenderness: There is no abdominal tenderness. There is no right CVA tenderness, left CVA tenderness, guarding or rebound.     Hernia: No hernia is present.  Musculoskeletal:     Cervical back: Neck supple.     Comments: Healing laceration with macerated skin noted to the interdigital space between the first and second digits of the left hand.  No erythema, swelling, red streaking, purulence.  See photos below.  Skin:    General: Skin is warm and dry.     Capillary Refill: Capillary refill takes less than 2 seconds.     Comments: Ulceration noted to the sole of the right great toe.  No surrounding fluctuance, erythema, red streaking, or purulent drainage.  He has onychomycosis on several toes.  No paronychia.  No erythema, swelling, red streaking. Good skin turgor.  Neurological:     Mental Status: He is alert.  Psychiatric:        Behavior: Behavior normal.       ED Results / Procedures / Treatments   Labs (all labs ordered are listed, but only abnormal results are displayed) Labs Reviewed - No data to display  EKG None  Radiology No results found.  Procedures Procedures   Medications Ordered in ED Medications - No data to display  ED Course  I have reviewed the triage vital signs and the nursing notes.  Pertinent labs & imaging results that were available during my care of the patient were reviewed by me and considered in my medical decision making (see chart for details).    MDM Rules/Calculators/A&P  65 year old male who is well-known to the emergency  department with 29 visits in the last 6 months presents the emergency department by EMS with a chief complaint of syncope, but EMS reports that they were contacted after the patient was kicked out of a store for aggressive behavior.  Patient's head is atraumatic.  He has been observed for more than 7 hours in the emergency department and has been stable.  Initially stated he had been vomiting, but clinically does not appear dehydrated.  He has been tolerating fluids at bedside without difficulty.  Vital signs are stable.  He does have an ulceration noted to the base of the right great toe.  Wound care has been provided and a blister dressing was placed on the digit.  He also had the dressing replaced on his left hand from the laceration a week ago.  There are some macerated skin, but I suspect this is related to getting the dressing wet that has been on his hand for days.  Encouraged outpatient follow-up for wound recheck.  However, I do not see any related cellulitis, myositis, abscess or deep space infection on the hand or the foot.  At this time, no further urgent or emergent work-up is indicated.  He is hemodynamically stable and in no acute distress.  Safe for discharge home with outpatient follow-up as discussed.  Final Clinical Impression(s) / ED Diagnoses Final diagnoses:  Homelessness  Laceration of left hand, foreign body presence unspecified, subsequent encounter  Open wound of left great toe, initial encounter    Rx / DC Orders ED Discharge Orders     None        Dvante Hands A, PA-C 01/15/21 0721    Molpus, Jonny Ruiz, MD 01/15/21 1201

## 2021-01-27 ENCOUNTER — Ambulatory Visit (HOSPITAL_COMMUNITY)
Admission: EM | Admit: 2021-01-27 | Discharge: 2021-01-27 | Discharge status: Absent without leave | Payer: Medicaid Other

## 2021-01-27 ENCOUNTER — Emergency Department (HOSPITAL_COMMUNITY): Payer: Medicaid Other

## 2021-01-27 ENCOUNTER — Emergency Department (HOSPITAL_COMMUNITY)
Admission: EM | Admit: 2021-01-27 | Discharge: 2021-01-27 | Disposition: A | Discharge status: Home / Self Care | Payer: Medicaid Other | Attending: Emergency Medicine | Admitting: Emergency Medicine

## 2021-01-27 ENCOUNTER — Other Ambulatory Visit: Payer: Self-pay

## 2021-01-27 ENCOUNTER — Encounter (HOSPITAL_COMMUNITY): Payer: Self-pay | Admitting: Emergency Medicine

## 2021-01-27 DIAGNOSIS — E1122 Type 2 diabetes mellitus with diabetic chronic kidney disease: Secondary | ICD-10-CM | POA: Diagnosis not present

## 2021-01-27 DIAGNOSIS — I129 Hypertensive chronic kidney disease with stage 1 through stage 4 chronic kidney disease, or unspecified chronic kidney disease: Secondary | ICD-10-CM | POA: Insufficient documentation

## 2021-01-27 DIAGNOSIS — M79672 Pain in left foot: Secondary | ICD-10-CM | POA: Diagnosis not present

## 2021-01-27 DIAGNOSIS — Z794 Long term (current) use of insulin: Secondary | ICD-10-CM | POA: Diagnosis not present

## 2021-01-27 DIAGNOSIS — M79671 Pain in right foot: Secondary | ICD-10-CM | POA: Diagnosis present

## 2021-01-27 DIAGNOSIS — Z96652 Presence of left artificial knee joint: Secondary | ICD-10-CM | POA: Insufficient documentation

## 2021-01-27 DIAGNOSIS — E785 Hyperlipidemia, unspecified: Secondary | ICD-10-CM | POA: Insufficient documentation

## 2021-01-27 DIAGNOSIS — Z7984 Long term (current) use of oral hypoglycemic drugs: Secondary | ICD-10-CM | POA: Insufficient documentation

## 2021-01-27 DIAGNOSIS — E1169 Type 2 diabetes mellitus with other specified complication: Secondary | ICD-10-CM | POA: Diagnosis not present

## 2021-01-27 DIAGNOSIS — J45909 Unspecified asthma, uncomplicated: Secondary | ICD-10-CM | POA: Insufficient documentation

## 2021-01-27 DIAGNOSIS — Z79899 Other long term (current) drug therapy: Secondary | ICD-10-CM | POA: Insufficient documentation

## 2021-01-27 DIAGNOSIS — F17210 Nicotine dependence, cigarettes, uncomplicated: Secondary | ICD-10-CM | POA: Insufficient documentation

## 2021-01-27 DIAGNOSIS — L97511 Non-pressure chronic ulcer of other part of right foot limited to breakdown of skin: Secondary | ICD-10-CM | POA: Insufficient documentation

## 2021-01-27 DIAGNOSIS — N1831 Chronic kidney disease, stage 3a: Secondary | ICD-10-CM | POA: Diagnosis not present

## 2021-01-27 NOTE — ED Notes (Signed)
Pt transported to Xray via stretcher at this time.  ?

## 2021-01-27 NOTE — ED Notes (Signed)
Pt declined updating vitals. Pt also refusing to leave. Escorted out by security.

## 2021-01-27 NOTE — Discharge Instructions (Addendum)
You were seen in the Emergency Department for foot pain. You have an ulcer on the bottom of your right foot, which can progress to a serious infection if not properly cared for. Please keep the area as clean as possible and keep it covered. Make an appointment to see a doctor at the [facility]

## 2021-01-27 NOTE — ED Triage Notes (Signed)
Pt reports he has bilateral knee/foot pain.  States this the the pain that he has often.

## 2021-01-27 NOTE — ED Notes (Signed)
Gauze dressing applied to R foot. Pt stating he had no where to go d/t being homeless. Offered pt bus pass, pt states he can not ride bus d.t being unable to walk. When asked how he got here today, he states he walked. Social worker contacted, states pt is well known to facility and is aware of resources. Pt given bus pass. Pt escorted out of room by security.

## 2021-01-27 NOTE — ED Provider Notes (Signed)
[facility] EMERGENCY DEPARTMENT Provider Note   CSN: [id number] Arrival date & time: 01/27/21  0258     History Chief Complaint  Patient presents with   Foot Pain   Knee Pain    Dylan Jordan is a 65 y.o. male presenting with bilateral foot pain.  Patient reports bilateral foot pain over the past 4 months. Yesterday he walked ~2 miles and now his foot pain is acutely worse. Right > left. Located mostly on the dorsal aspect of his mid to distal foot. He endorses associated swelling and decreased sensation. No fever or chills. Patient has been homeless over the past 4 months. Per chart review he is prescribed medications for diabetes, neuropathy, schizophrenia, HTN, and HLD however patient is not taking any of these.    Past Medical History:  Diagnosis Date   Anemia    Asthma    Diabetes mellitus without complication (HCC)    Gout    Hyperlipidemia    Hyperosmolar hyperglycemic coma due to diabetes mellitus without ketoacidosis (HCC)    Hypertension    Schizophrenia (HCC) 07/06/2018    Patient Active Problem List   Diagnosis Date Noted   Malingering 11/15/2020   Homelessness 09/05/2020   Stage 3a chronic kidney disease (HCC) 04/27/2020   Slow transit constipation 04/27/2020   Noncompliance with diabetes treatment 04/20/2020   Uncontrolled type 2 diabetes mellitus with hyperglycemia (HCC) 04/20/2020   Hyperkalemia, diminished renal excretion 03/29/2020   Hyperkalemia    Schizoaffective disorder, bipolar type (HCC) 02/21/2019   Hallucinations    Anxiety 09/18/2018   Swelling of both lower extremities 09/13/2018   Type 2 diabetes mellitus without complication, with long-term current use of insulin (HCC)    Anemia    Hyperlipidemia 12/18/2017   Mixed stress and urge urinary incontinence 11/25/2017   Intermittent chest pain 11/25/2017   Allergic rhinitis with a nonallergic component 07/30/2017   Food allergy 07/30/2017   Chronic gout involving toe of  left foot without tophus 05/18/2017   Hemorrhoids 05/04/2017   Chronic low back pain 01/03/2017   Chronic cough 06/30/2016   Drug-induced mood disorder (HCC) 06/14/2016   Type 2 diabetes mellitus with diabetic neuropathy (HCC) 05/26/2016   Iron deficiency anemia 05/26/2016   Chronic kidney disease 05/26/2016   HTN (hypertension) 05/26/2016   Tobacco abuse 05/26/2016   History of substance abuse (HCC) 05/26/2016   History of alcohol abuse 05/26/2016   Mild persistent asthma 05/26/2016    Past Surgical History:  Procedure Laterality Date   REPLACEMENT TOTAL KNEE Left    TOE SURGERY         Family History  Adopted: Yes  Problem Relation Age of Onset   Allergic rhinitis Neg Hx    Angioedema Neg Hx    Asthma Neg Hx    Atopy Neg Hx    Eczema Neg Hx    Immunodeficiency Neg Hx    Urticaria Neg Hx     Social History   Tobacco Use   Smoking status: Some Days    Packs/day: 0.30    Years: 45.00    Pack years: 13.50    Types: Cigarettes    Start date: 05/16/1971   Smokeless tobacco: Never   Tobacco comments:    Decreased Intake to 10 cigarettes per week or less.  Vaping Use   Vaping Use: Never used  Substance Use Topics   Alcohol use: No   Drug use: No    Home Medications Prior to Admission medications  Medication Sig Start Date End Date Taking? Authorizing Provider  albuterol (VENTOLIN HFA) 108 (90 Base) MCG/ACT inhaler Inhale 1-2 puffs into the lungs every 4 (four) hours as needed for wheezing or shortness of breath. 10/29/20   Willy Eddy, MD  amLODipine (NORVASC) 10 MG tablet Take 1 tablet (10 mg total) by mouth daily. 10/29/20   Willy Eddy, MD  atorvastatin (LIPITOR) 20 MG tablet Take 1 tablet (20 mg total) by mouth at bedtime. 10/29/20 12/28/20  Willy Eddy, MD  benztropine (COGENTIN) 1 MG tablet Take 1 tablet (1 mg total) by mouth 2 (two) times daily. 10/29/20   Willy Eddy, MD  clotrimazole (LOTRIMIN) 1 % cream Apply to affected area 2 times  daily 01/10/21   Alvira Monday, MD  desmopressin (DDAVP) 0.2 MG tablet Take 1 tablet (0.2 mg total) by mouth at bedtime. 10/29/20   Willy Eddy, MD  ferrous sulfate 325 (65 FE) MG tablet Take 1 tablet (325 mg total) by mouth every other day. 10/29/20   Willy Eddy, MD  gabapentin (NEURONTIN) 300 MG capsule Take 2 capsules (600 mg total) by mouth 3 (three) times daily. 10/29/20 11/28/20  Willy Eddy, MD  insulin aspart (NOVOLOG) 100 UNIT/ML injection Inject 9 Units into the skin 3 (three) times daily with meals. 10/29/20   Willy Eddy, MD  insulin glargine (LANTUS) 100 UNIT/ML injection Inject 0.4 mLs (40 Units total) into the skin daily. 10/29/20   Willy Eddy, MD  lisinopril (ZESTRIL) 10 MG tablet Take 1 tablet (10 mg total) by mouth daily. 10/29/20 11/28/20  Willy Eddy, MD  Melatonin 10 MG TABS Take 10 mg by mouth at bedtime. 10/29/20   Willy Eddy, MD  metformin (FORTAMET) 1000 MG (OSM) 24 hr tablet Take 1 tablet (1,000 mg total) by mouth daily with breakfast. 10/29/20   Willy Eddy, MD  miconazole [facility]) 2 % powder Apply topically in the morning and at bedtime. 11/28/20   Geoffery Lyons, MD  nicotine (NICODERM CQ) 21 mg/24hr patch Place 1 patch (21 mg total) onto the skin daily. 11/28/20   Geoffery Lyons, MD  OLANZapine (ZYPREXA) 10 MG tablet Take 1 tablet (10 mg total) by mouth in the morning and at bedtime. 10/29/20 11/28/20  Willy Eddy, MD  paliperidone (INVEGA) 9 MG 24 hr tablet Take 1 tablet (9 mg total) by mouth daily. 10/29/20   Willy Eddy, MD  pantoprazole (PROTONIX) 40 MG tablet Take 1 tablet (40 mg total) by mouth daily. 10/29/20   Willy Eddy, MD    Allergies    Chantix [varenicline tartrate], Ativan [lorazepam], Ibuprofen, Other, Pineapple, and Penicillins  Review of Systems   Review of Systems  Constitutional:  Negative for chills and fever.  Musculoskeletal:  Positive for arthralgias.       Bilateral, R>L, foot pain and  swelling  Skin:  Positive for wound.  Neurological:  Negative for weakness.   Physical Exam Updated Vital Signs BP (!) 157/95 (BP Location: Left Arm)   Pulse (!) 54   Temp 98.2 F (36.8 C) (Oral)   Resp 18   SpO2 100%   Physical Exam Constitutional:      General: He is not in acute distress.    Appearance: Normal appearance. He is not toxic-appearing.  HENT:     Head: Normocephalic and atraumatic.  Cardiovascular:     Comments: 2+ DP pulses bilaterally Pulmonary:     Effort: Pulmonary effort is normal.  Musculoskeletal:        General: Normal range of motion.  Comments: Poor overall foot hygiene. Decreased sensation to light touch of bilateral feet, R worse than L. 1+ pitting edema of right lower extremity up to proximal shin.   Skin:    Capillary Refill: Capillary refill takes less than 2 seconds.     Comments: 43mm ulcerated area on plantar aspect of R great toe without surrounding erythema or warmth. No drainage.  Neurological:     General: No focal deficit present.     Mental Status: He is alert. Mental status is at baseline.     ED Results / Procedures / Treatments   Labs (all labs ordered are listed, but only abnormal results are displayed) Labs Reviewed - No data to display  EKG None  Radiology DG Foot 2 Views Right  Result Date: 01/27/2021 CLINICAL DATA:  Toe ulcer, evaluate for osteomyelitis EXAM: RIGHT FOOT - 2 VIEW COMPARISON:  11/25/2020 FINDINGS: Plantar great toe ulceration without opaque foreign body or bony erosion. No acute fracture or dislocation. Plantar heel spur. IMPRESSION: No evidence of osteomyelitis or foreign body at the great toe ulcer. Electronically Signed   By: Tiburcio Pea M.D.   On: 01/27/2021 09:41    Procedures Procedures   Medications Ordered in ED Medications - No data to display  ED Course  I have reviewed the triage vital signs and the nursing notes.  Pertinent labs & imaging results that were available during my care  of the patient were reviewed by me and considered in my medical decision making (see chart for details).    MDM Rules/Calculators/A&P                         This is a 65 year old male with PMH significant for DM2 with neuropathy, HTN, HLD, schizophrenia, and homelessness presenting with bilateral foot pain.  This is a fairly chronic problem (x4 months) and is temporally related to patient's homelessness, however it acutely worsened after walking 2 miles yesterday. On exam he has an ulcerated area on the plantar aspect of R great toe and decreased sensation throughout b/l feet. Wound does not look infected on exam and reassuringly he is afebrile here with no fever/chills per patient report. However, will obtain x-ray to evaluate for osteomyelitis.  Differential includes peripheral neuropathy, osteomyelitis, arthritis, tendinopathy, malingering. Less likely acute fracture.   X-ray without evidence for osteo or other acute abnormality. No indication for antibiotic therapy at this time. Dressing placed over wound and patient stable for discharge. Encouraged follow up at [facility].  Final Clinical Impression(s) / ED Diagnoses Final diagnoses:  Ulcer of right foot, limited to breakdown of skin (HCC)  Foot pain, bilateral    Rx / DC Orders ED Discharge Orders     None      Maury Dus, MD PGY-2 [facility]   Maury Dus, MD 01/27/21 1129    Horton, Clabe Seal, DO 01/27/21 1309

## 2021-01-27 NOTE — ED Provider Notes (Signed)
Emergency Medicine Provider Triage Evaluation Note  Dylan Jordan , a 65 y.o. male  was evaluated in triage.  Pt complains of diffuse bodily pain-- feet, knees, arms, shoulders, etc.  Denies injury.  Denies fever/chills, cough, or other URI symptoms.  Review of Systems  Positive: Joint pain Negative: Fever, chills  Physical Exam  BP 135/79 (BP Location: Right Arm)   Pulse 84   Temp 98.2 F (36.8 C) (Oral)   Resp 16   SpO2 100%  Gen:   Awake, no distress   Resp:  Normal effort  MSK:   Moves extremities without difficulty  Other:    Medical Decision Making  Medically screening exam initiated at 3:50 AM.  Appropriate orders placed.  Dylan Jordan was informed that the remainder of the evaluation will be completed by another provider, this initial triage assessment does not replace that evaluation, and the importance of remaining in the ED until their evaluation is complete.    Garlon Hatchet, PA-C 01/27/21 3291    Zadie Rhine, MD 01/27/21 [phone]

## 2021-01-28 ENCOUNTER — Emergency Department (HOSPITAL_COMMUNITY)
Admission: EM | Admit: 2021-01-28 | Discharge: 2021-01-28 | Disposition: A | Discharge status: Home / Self Care | Payer: Medicaid Other | Attending: Emergency Medicine | Admitting: Emergency Medicine

## 2021-01-28 ENCOUNTER — Emergency Department (HOSPITAL_COMMUNITY)
Admission: EM | Admit: 2021-01-28 | Discharge: 2021-01-28 | Disposition: A | Discharge status: Home / Self Care | Payer: Medicaid Other | Source: Home / Self Care

## 2021-01-28 ENCOUNTER — Encounter (HOSPITAL_COMMUNITY): Payer: Self-pay

## 2021-01-28 DIAGNOSIS — L97519 Non-pressure chronic ulcer of other part of right foot with unspecified severity: Secondary | ICD-10-CM | POA: Diagnosis not present

## 2021-01-28 DIAGNOSIS — M7989 Other specified soft tissue disorders: Secondary | ICD-10-CM | POA: Diagnosis not present

## 2021-01-28 DIAGNOSIS — Z96652 Presence of left artificial knee joint: Secondary | ICD-10-CM | POA: Insufficient documentation

## 2021-01-28 DIAGNOSIS — Z794 Long term (current) use of insulin: Secondary | ICD-10-CM | POA: Diagnosis not present

## 2021-01-28 DIAGNOSIS — Z79899 Other long term (current) drug therapy: Secondary | ICD-10-CM | POA: Diagnosis not present

## 2021-01-28 DIAGNOSIS — N1831 Chronic kidney disease, stage 3a: Secondary | ICD-10-CM | POA: Diagnosis not present

## 2021-01-28 DIAGNOSIS — Z7952 Long term (current) use of systemic steroids: Secondary | ICD-10-CM | POA: Insufficient documentation

## 2021-01-28 DIAGNOSIS — R5383 Other fatigue: Secondary | ICD-10-CM | POA: Insufficient documentation

## 2021-01-28 DIAGNOSIS — R2241 Localized swelling, mass and lump, right lower limb: Secondary | ICD-10-CM | POA: Insufficient documentation

## 2021-01-28 DIAGNOSIS — J45909 Unspecified asthma, uncomplicated: Secondary | ICD-10-CM | POA: Diagnosis not present

## 2021-01-28 DIAGNOSIS — E1122 Type 2 diabetes mellitus with diabetic chronic kidney disease: Secondary | ICD-10-CM | POA: Insufficient documentation

## 2021-01-28 DIAGNOSIS — F17210 Nicotine dependence, cigarettes, uncomplicated: Secondary | ICD-10-CM | POA: Insufficient documentation

## 2021-01-28 DIAGNOSIS — I129 Hypertensive chronic kidney disease with stage 1 through stage 4 chronic kidney disease, or unspecified chronic kidney disease: Secondary | ICD-10-CM | POA: Diagnosis not present

## 2021-01-28 NOTE — Discharge Instructions (Addendum)
Please follow up with [facility].

## 2021-01-28 NOTE — ED Triage Notes (Signed)
Pt arrived via EMS, from fire station, states he feels tired after walking tonight.

## 2021-01-28 NOTE — ED Notes (Signed)
Attempted to call patient's niece at number provided by patient without answer.

## 2021-01-28 NOTE — ED Provider Notes (Signed)
[facility]-EMERGENCY DEPT Provider Note   CSN: [id number] Arrival date & time: 01/28/21  1417     History Chief Complaint  Patient presents with   Tired after Walking    Jacameron Wyman is a 65 y.o. male.  Patient with past medical history of type 2 diabetes, chronic kidney disease, hypertension, polysubstance abuse, hyperlipidemia, schizoaffective disorder bipolar type, homelessness, malingering  Patient presents to the ED today with complaints of right lower extremity swelling.  He says that both of his legs been swollen since last May however no one is done anything about it.  He says over the past 2 days his right leg has become more swollen than his left.  He complains of pain of that right leg.  He was recently seen in the [facility] emergency department yesterday for similar complaints.  At that time they found a small ulcer on his right big toe.  It looked noninfected at this time.  X-ray was done to rule out osteo, however it was normal.  At that time he had very low suspicion for infection.  HPI     Past Medical History:  Diagnosis Date   Anemia    Asthma    Diabetes mellitus without complication (HCC)    Gout    Hyperlipidemia    Hyperosmolar hyperglycemic coma due to diabetes mellitus without ketoacidosis (HCC)    Hypertension    Schizophrenia (HCC) 07/06/2018    Patient Active Problem List   Diagnosis Date Noted   Malingering 11/15/2020   Homelessness 09/05/2020   Stage 3a chronic kidney disease (HCC) 04/27/2020   Slow transit constipation 04/27/2020   Noncompliance with diabetes treatment 04/20/2020   Uncontrolled type 2 diabetes mellitus with hyperglycemia (HCC) 04/20/2020   Hyperkalemia, diminished renal excretion 03/29/2020   Hyperkalemia    Schizoaffective disorder, bipolar type (HCC) 02/21/2019   Hallucinations    Anxiety 09/18/2018   Swelling of both lower extremities 09/13/2018   Type 2 diabetes mellitus without complication,  with long-term current use of insulin (HCC)    Anemia    Hyperlipidemia 12/18/2017   Mixed stress and urge urinary incontinence 11/25/2017   Intermittent chest pain 11/25/2017   Allergic rhinitis with a nonallergic component 07/30/2017   Food allergy 07/30/2017   Chronic gout involving toe of left foot without tophus 05/18/2017   Hemorrhoids 05/04/2017   Chronic low back pain 01/03/2017   Chronic cough 06/30/2016   Drug-induced mood disorder (HCC) 06/14/2016   Type 2 diabetes mellitus with diabetic neuropathy (HCC) 05/26/2016   Iron deficiency anemia 05/26/2016   Chronic kidney disease 05/26/2016   HTN (hypertension) 05/26/2016   Tobacco abuse 05/26/2016   History of substance abuse (HCC) 05/26/2016   History of alcohol abuse 05/26/2016   Mild persistent asthma 05/26/2016    Past Surgical History:  Procedure Laterality Date   REPLACEMENT TOTAL KNEE Left    TOE SURGERY         Family History  Adopted: Yes  Problem Relation Age of Onset   Allergic rhinitis Neg Hx    Angioedema Neg Hx    Asthma Neg Hx    Atopy Neg Hx    Eczema Neg Hx    Immunodeficiency Neg Hx    Urticaria Neg Hx     Social History   Tobacco Use   Smoking status: Some Days    Packs/day: 0.30    Years: 45.00    Pack years: 13.50    Types: Cigarettes    Start  date: 05/16/1971   Smokeless tobacco: Never   Tobacco comments:    Decreased Intake to 10 cigarettes per week or less.  Vaping Use   Vaping Use: Never used  Substance Use Topics   Alcohol use: No   Drug use: No    Home Medications Prior to Admission medications   Medication Sig Start Date End Date Taking? Authorizing Provider  albuterol (VENTOLIN HFA) 108 (90 Base) MCG/ACT inhaler Inhale 1-2 puffs into the lungs every 4 (four) hours as needed for wheezing or shortness of breath. 10/29/20   Willy Eddy, MD  amLODipine (NORVASC) 10 MG tablet Take 1 tablet (10 mg total) by mouth daily. 10/29/20   Willy Eddy, MD  atorvastatin  (LIPITOR) 20 MG tablet Take 1 tablet (20 mg total) by mouth at bedtime. 10/29/20 12/28/20  Willy Eddy, MD  benztropine (COGENTIN) 1 MG tablet Take 1 tablet (1 mg total) by mouth 2 (two) times daily. 10/29/20   Willy Eddy, MD  clotrimazole (LOTRIMIN) 1 % cream Apply to affected area 2 times daily 01/10/21   Alvira Monday, MD  desmopressin (DDAVP) 0.2 MG tablet Take 1 tablet (0.2 mg total) by mouth at bedtime. 10/29/20   Willy Eddy, MD  ferrous sulfate 325 (65 FE) MG tablet Take 1 tablet (325 mg total) by mouth every other day. 10/29/20   Willy Eddy, MD  gabapentin (NEURONTIN) 300 MG capsule Take 2 capsules (600 mg total) by mouth 3 (three) times daily. 10/29/20 11/28/20  Willy Eddy, MD  insulin aspart (NOVOLOG) 100 UNIT/ML injection Inject 9 Units into the skin 3 (three) times daily with meals. 10/29/20   Willy Eddy, MD  insulin glargine (LANTUS) 100 UNIT/ML injection Inject 0.4 mLs (40 Units total) into the skin daily. 10/29/20   Willy Eddy, MD  lisinopril (ZESTRIL) 10 MG tablet Take 1 tablet (10 mg total) by mouth daily. 10/29/20 11/28/20  Willy Eddy, MD  Melatonin 10 MG TABS Take 10 mg by mouth at bedtime. 10/29/20   Willy Eddy, MD  metformin (FORTAMET) 1000 MG (OSM) 24 hr tablet Take 1 tablet (1,000 mg total) by mouth daily with breakfast. 10/29/20   Willy Eddy, MD  miconazole [facility]) 2 % powder Apply topically in the morning and at bedtime. 11/28/20   Geoffery Lyons, MD  nicotine (NICODERM CQ) 21 mg/24hr patch Place 1 patch (21 mg total) onto the skin daily. 11/28/20   Geoffery Lyons, MD  OLANZapine (ZYPREXA) 10 MG tablet Take 1 tablet (10 mg total) by mouth in the morning and at bedtime. 10/29/20 11/28/20  Willy Eddy, MD  paliperidone (INVEGA) 9 MG 24 hr tablet Take 1 tablet (9 mg total) by mouth daily. 10/29/20   Willy Eddy, MD  pantoprazole (PROTONIX) 40 MG tablet Take 1 tablet (40 mg total) by mouth daily. 10/29/20   Willy Eddy, MD    Allergies    Chantix [varenicline tartrate], Ativan [lorazepam], Ibuprofen, Other, Pineapple, and Penicillins  Review of Systems   Review of Systems  Constitutional:  Positive for fatigue. Negative for chills and fever.  HENT:  Positive for congestion. Negative for rhinorrhea.   Eyes:  Negative for visual disturbance.  Respiratory:  Negative for cough, chest tightness and shortness of breath.   Cardiovascular:  Positive for leg swelling. Negative for chest pain and palpitations.  Gastrointestinal:  Negative for abdominal pain, constipation, diarrhea, nausea and vomiting.  Genitourinary:  Negative for difficulty urinating.  Musculoskeletal:  Positive for arthralgias. Negative for back pain.  Skin:  Negative for rash and  wound.  Neurological:  Negative for dizziness, syncope, weakness, light-headedness and headaches.  All other systems reviewed and are negative.  Physical Exam Updated Vital Signs BP (!) 167/86   Pulse 83   Temp 98.8 F (37.1 C) (Oral)   Resp 16   SpO2 100%   Physical Exam Vitals and nursing note reviewed.  Constitutional:      General: He is not in acute distress.    Appearance: Normal appearance. He is not ill-appearing, toxic-appearing or diaphoretic.  HENT:     Head: Normocephalic and atraumatic.  Eyes:     General: No scleral icterus.       Right eye: No discharge.        Left eye: No discharge.     Conjunctiva/sclera: Conjunctivae normal.  Cardiovascular:     Rate and Rhythm: Normal rate and regular rhythm.     Pulses: Normal pulses.     Heart sounds: Normal heart sounds. No murmur heard.   No friction rub. No gallop.  Pulmonary:     Effort: Pulmonary effort is normal. No respiratory distress.     Breath sounds: Normal breath sounds. No wheezing, rhonchi or rales.  Abdominal:     General: Abdomen is flat. There is no distension.     Palpations: Abdomen is soft.     Tenderness: There is no abdominal tenderness. There is no guarding  or rebound.  Musculoskeletal:     Right lower leg: 1+ Edema present.     Left lower leg: No edema.  Feet:     Right foot:     Skin integrity: Ulcer present.     Left foot:     Skin integrity: Skin integrity normal.     Comments: Small ulcer noted on the right big toe.  No erythema surrounding site.  He is very shallow.  No evidence of infection. Skin:    General: Skin is warm and dry.  Neurological:     Mental Status: He is alert. Mental status is at baseline.  Psychiatric:        Behavior: Behavior is agitated.    ED Results / Procedures / Treatments   Labs (all labs ordered are listed, but only abnormal results are displayed) Labs Reviewed - No data to display  EKG None  Radiology DG Foot 2 Views Right  Result Date: 01/27/2021 CLINICAL DATA:  Toe ulcer, evaluate for osteomyelitis EXAM: RIGHT FOOT - 2 VIEW COMPARISON:  11/25/2020 FINDINGS: Plantar great toe ulceration without opaque foreign body or bony erosion. No acute fracture or dislocation. Plantar heel spur. IMPRESSION: No evidence of osteomyelitis or foreign body at the great toe ulcer. Electronically Signed   By: Tiburcio Pea M.D.   On: 01/27/2021 09:41   VAS [place] LOWER EXTREMITY VENOUS (DVT) (ONLY [facility] & [facility] 7a-7p)  Result Date: 01/28/2021  Lower Venous DVT Study Patient Name:  QUAMAINE WEBB  Date of Exam:   01/28/2021 Medical Rec #: [id number]      Accession #:    [id number] Date of Birth: January 16, 1956      Patient Gender: M Patient Age:   62 years Exam Location:  [facility] Procedure:      VAS [place] LOWER EXTREMITY VENOUS (DVT) Referring Phys: Breylan Lefevers --------------------------------------------------------------------------------  Indications: Swelling.  Risk Factors: None identified. Limitations: Poor ultrasound/tissue interface. Comparison Study: No prior studies. Performing Technologist: Chanda Busing RVT  Examination Guidelines: A complete evaluation includes B-mode imaging, spectral Doppler, color Doppler,  and power Doppler as needed of all  accessible portions of each vessel. Bilateral testing is considered an integral part of a complete examination. Limited examinations for reoccurring indications may be performed as noted. The reflux portion of the exam is performed with the patient in reverse Trendelenburg.  +---------+---------------+---------+-----------+----------+--------------+ RIGHT    CompressibilityPhasicitySpontaneityPropertiesThrombus Aging +---------+---------------+---------+-----------+----------+--------------+ CFV      Full           Yes      Yes                                 +---------+---------------+---------+-----------+----------+--------------+ SFJ      Full                                                        +---------+---------------+---------+-----------+----------+--------------+ FV Prox  Full                                                        +---------+---------------+---------+-----------+----------+--------------+ FV Mid   Full                                                        +---------+---------------+---------+-----------+----------+--------------+ FV DistalFull                                                        +---------+---------------+---------+-----------+----------+--------------+ PFV      Full                                                        +---------+---------------+---------+-----------+----------+--------------+ POP      Full           Yes      Yes                                 +---------+---------------+---------+-----------+----------+--------------+ PTV      Full                                                        +---------+---------------+---------+-----------+----------+--------------+ PERO     Full                                                        +---------+---------------+---------+-----------+----------+--------------+    +----+---------------+---------+-----------+----------+--------------+ LEFTCompressibilityPhasicitySpontaneityPropertiesThrombus Aging +----+---------------+---------+-----------+----------+--------------+  CFV Full           Yes      Yes                                 +----+---------------+---------+-----------+----------+--------------+    Summary: RIGHT: - There is no evidence of deep vein thrombosis in the lower extremity.  - No cystic structure found in the popliteal fossa.  LEFT: - No evidence of common femoral vein obstruction.  *See table(s) above for measurements and observations.    Preliminary     Procedures Procedures   Medications Ordered in ED Medications - No data to display  ED Course  I have reviewed the triage vital signs and the nursing notes.  Pertinent labs & imaging results that were available during my care of the patient were reviewed by me and considered in my medical decision making (see chart for details).    MDM Rules/Calculators/A&P                         This is a well-appearing 65 year old male.  He is well-known to our emergency department.  He looks to be in no acute distress.  He is afebrile.  His vital signs are stable.  Reviewed past medical charts and he was recently seen last night at [facility] emergency center for similar complaints of bilateral foot pain and leg swelling.  At that time they found a small ulcer on his right big toe that appeared to be a noninfected.  An x-ray was done with no evidence of osteomyelitis.  No systemic symptoms present to suggest osteomyelitis.  On exam his right leg is slightly more swollen than his left leg.  It does not appear that he got duplex Dopplers last night so I will obtain this however have low suspicion that this is the cause of his leg pain..  I reexamined the small ulcer that was noted last night and appeared to be unchanged from the prior picture.  No signs of infection.  Patient has no systemic  symptoms of infection.  I suspicion for infection.  Patient is also increasingly agitated while talking with patient.  Duplex Doppler is negative for DVT.  I discussed these results with the patient and he insisted that something else is wrong with him.  Told him that he has no signs that he needs any further work-up and to follow-up with [facility].  Provided him food and change of plans.  He was agreeable to be discharged at this time.   Final Clinical Impression(s) / ED Diagnoses Final diagnoses:  Right leg swelling    Rx / DC Orders ED Discharge Orders     None        Claudie Leach, PA-C 01/29/21 0029    Horton, Clabe Seal, DO 01/29/21 1531

## 2021-01-28 NOTE — Progress Notes (Signed)
Right lower extremity venous duplex has been completed. Preliminary results can be found in CV Proc through chart review.  Results were given to Dr. Wilkie Aye.  01/28/21 6:06 PM Olen Cordial RVT

## 2021-01-28 NOTE — ED Notes (Signed)
Patient provided with paper scrubs, bus pass, sandwich, cheese stick, and water at time of d/c.

## 2021-02-01 ENCOUNTER — Other Ambulatory Visit: Payer: Self-pay

## 2021-02-01 ENCOUNTER — Encounter (HOSPITAL_COMMUNITY): Payer: Self-pay | Admitting: *Deleted

## 2021-02-01 ENCOUNTER — Emergency Department (HOSPITAL_COMMUNITY)
Admission: EM | Admit: 2021-02-01 | Discharge: 2021-02-02 | Disposition: A | Discharge status: Home / Self Care | Payer: Medicaid Other | Attending: Emergency Medicine | Admitting: Emergency Medicine

## 2021-02-01 DIAGNOSIS — I129 Hypertensive chronic kidney disease with stage 1 through stage 4 chronic kidney disease, or unspecified chronic kidney disease: Secondary | ICD-10-CM | POA: Insufficient documentation

## 2021-02-01 DIAGNOSIS — Z794 Long term (current) use of insulin: Secondary | ICD-10-CM | POA: Insufficient documentation

## 2021-02-01 DIAGNOSIS — M791 Myalgia, unspecified site: Secondary | ICD-10-CM | POA: Insufficient documentation

## 2021-02-01 DIAGNOSIS — Z20822 Contact with and (suspected) exposure to covid-19: Secondary | ICD-10-CM | POA: Diagnosis not present

## 2021-02-01 DIAGNOSIS — F17210 Nicotine dependence, cigarettes, uncomplicated: Secondary | ICD-10-CM | POA: Insufficient documentation

## 2021-02-01 DIAGNOSIS — E1140 Type 2 diabetes mellitus with diabetic neuropathy, unspecified: Secondary | ICD-10-CM | POA: Insufficient documentation

## 2021-02-01 DIAGNOSIS — M79606 Pain in leg, unspecified: Secondary | ICD-10-CM | POA: Diagnosis present

## 2021-02-01 DIAGNOSIS — Y900 Blood alcohol level of less than 20 mg/100 ml: Secondary | ICD-10-CM | POA: Insufficient documentation

## 2021-02-01 DIAGNOSIS — M549 Dorsalgia, unspecified: Secondary | ICD-10-CM | POA: Insufficient documentation

## 2021-02-01 DIAGNOSIS — Z79899 Other long term (current) drug therapy: Secondary | ICD-10-CM | POA: Insufficient documentation

## 2021-02-01 DIAGNOSIS — Z5900 Homelessness unspecified: Secondary | ICD-10-CM | POA: Insufficient documentation

## 2021-02-01 DIAGNOSIS — E1122 Type 2 diabetes mellitus with diabetic chronic kidney disease: Secondary | ICD-10-CM | POA: Insufficient documentation

## 2021-02-01 DIAGNOSIS — J4530 Mild persistent asthma, uncomplicated: Secondary | ICD-10-CM | POA: Insufficient documentation

## 2021-02-01 DIAGNOSIS — N1831 Chronic kidney disease, stage 3a: Secondary | ICD-10-CM | POA: Diagnosis not present

## 2021-02-01 LAB — CBG MONITORING, ED: Glucose-Capillary: 296 mg/dL — ABNORMAL HIGH (ref 70–99)

## 2021-02-01 NOTE — ED Triage Notes (Addendum)
Pt arrives ambulatory to triage with c/o back and leg pain "for a long time". Pt with inappropriate laughing at times during triage.

## 2021-02-02 LAB — CBC WITH DIFFERENTIAL/PLATELET
Abs Immature Granulocytes: 0.02 10*3/uL (ref 0.00–0.07)
Basophils Absolute: 0 10*3/uL (ref 0.0–0.1)
Basophils Relative: 0 %
Eosinophils Absolute: 0.1 10*3/uL (ref 0.0–0.5)
Eosinophils Relative: 2 %
HCT: 35.7 % — ABNORMAL LOW (ref 39.0–52.0)
Hemoglobin: 11.1 g/dL — ABNORMAL LOW (ref 13.0–17.0)
Immature Granulocytes: 0 %
Lymphocytes Relative: 28 %
Lymphs Abs: 1.8 10*3/uL (ref 0.7–4.0)
MCH: 26.6 pg (ref 26.0–34.0)
MCHC: 31.1 g/dL (ref 30.0–36.0)
MCV: 85.4 fL (ref 80.0–100.0)
Monocytes Absolute: 0.6 10*3/uL (ref 0.1–1.0)
Monocytes Relative: 10 %
Neutro Abs: 3.7 10*3/uL (ref 1.7–7.7)
Neutrophils Relative %: 60 %
Platelets: 271 10*3/uL (ref 150–400)
RBC: 4.18 MIL/uL — ABNORMAL LOW (ref 4.22–5.81)
RDW: 13.8 % (ref 11.5–15.5)
WBC: 6.2 10*3/uL (ref 4.0–10.5)
nRBC: 0 % (ref 0.0–0.2)

## 2021-02-02 LAB — RAPID URINE DRUG SCREEN, HOSP PERFORMED
Amphetamines: NOT DETECTED
Barbiturates: NOT DETECTED
Benzodiazepines: NOT DETECTED
Cocaine: NOT DETECTED
Opiates: NOT DETECTED
Tetrahydrocannabinol: NOT DETECTED

## 2021-02-02 LAB — COMPREHENSIVE METABOLIC PANEL
ALT: 14 U/L (ref 0–44)
AST: 17 U/L (ref 15–41)
Albumin: 2.9 g/dL — ABNORMAL LOW (ref 3.5–5.0)
Alkaline Phosphatase: 66 U/L (ref 38–126)
Anion gap: 8 (ref 5–15)
BUN: 11 mg/dL (ref 8–23)
CO2: 21 mmol/L — ABNORMAL LOW (ref 22–32)
Calcium: 8.8 mg/dL — ABNORMAL LOW (ref 8.9–10.3)
Chloride: 110 mmol/L (ref 98–111)
Creatinine, Ser: 1.15 mg/dL (ref 0.61–1.24)
GFR, Estimated: >60 mL/min (ref >60)
Glucose, Bld: 316 mg/dL — ABNORMAL HIGH (ref 70–99)
Potassium: 3.8 mmol/L (ref 3.5–5.1)
Sodium: 139 mmol/L (ref 135–145)
Total Bilirubin: 0.4 mg/dL (ref 0.3–1.2)
Total Protein: 6.7 g/dL (ref 6.5–8.1)

## 2021-02-02 LAB — RESP PANEL BY RT-PCR (FLU A&B, COVID) ARPGX2
Influenza A by PCR: NEGATIVE
Influenza B by PCR: NEGATIVE
SARS Coronavirus 2 by RT PCR: NEGATIVE

## 2021-02-02 LAB — ACETAMINOPHEN LEVEL: Acetaminophen (Tylenol), Serum: <10 ug/mL — ABNORMAL LOW (ref 10–30)

## 2021-02-02 LAB — ETHANOL: Alcohol, Ethyl (B): <10 mg/dL (ref <10)

## 2021-02-02 LAB — SALICYLATE LEVEL: Salicylate Lvl: <7 mg/dL — ABNORMAL LOW (ref 7.0–30.0)

## 2021-02-02 NOTE — ED Notes (Addendum)
Pt continues to ask other pt waiting for money, to put ice in his shoe, and fist bumps. Pt continues to get up and walk around running into things. Pt also laughs loudly and talks to himself.
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
## 2021-02-02 NOTE — ED Provider Notes (Signed)
[facility] EMERGENCY DEPARTMENT Provider Note   CSN: [id number] Arrival date & time: 02/01/21  2255     History Chief Complaint  Patient presents with   Leg Pain    Dylan Jordan is a 65 y.o. male with history of asthma, schizophrenia, diabetes mellitus, gout, hypertension who presents the emergency department with a chief complaint of leg pain and back pain.  He states that he has been having back and leg pain for a long time.  No recent falls or injuries.  No numbness or weakness.  No fever or chills, abdominal pain, vomiting, diarrhea.  He denies SI, HI, or auditory visual hallucinations.  He is intermittently laughing inappropriately during triage.  The history is provided by the patient and medical records. No language interpreter was used.      Past Medical History:  Diagnosis Date   Anemia    Asthma    Diabetes mellitus without complication (HCC)    Gout    Hyperlipidemia    Hyperosmolar hyperglycemic coma due to diabetes mellitus without ketoacidosis (HCC)    Hypertension    Schizophrenia (HCC) 07/06/2018    Patient Active Problem List   Diagnosis Date Noted   Malingering 11/15/2020   Homelessness 09/05/2020   Stage 3a chronic kidney disease (HCC) 04/27/2020   Slow transit constipation 04/27/2020   Noncompliance with diabetes treatment 04/20/2020   Uncontrolled type 2 diabetes mellitus with hyperglycemia (HCC) 04/20/2020   Hyperkalemia, diminished renal excretion 03/29/2020   Hyperkalemia    Schizoaffective disorder, bipolar type (HCC) 02/21/2019   Hallucinations    Anxiety 09/18/2018   Swelling of both lower extremities 09/13/2018   Type 2 diabetes mellitus without complication, with long-term current use of insulin (HCC)    Anemia    Hyperlipidemia 12/18/2017   Mixed stress and urge urinary incontinence 11/25/2017   Intermittent chest pain 11/25/2017   Allergic rhinitis with a nonallergic component 07/30/2017   Food allergy 07/30/2017    Chronic gout involving toe of left foot without tophus 05/18/2017   Hemorrhoids 05/04/2017   Chronic low back pain 01/03/2017   Chronic cough 06/30/2016   Drug-induced mood disorder (HCC) 06/14/2016   Type 2 diabetes mellitus with diabetic neuropathy (HCC) 05/26/2016   Iron deficiency anemia 05/26/2016   Chronic kidney disease 05/26/2016   HTN (hypertension) 05/26/2016   Tobacco abuse 05/26/2016   History of substance abuse (HCC) 05/26/2016   History of alcohol abuse 05/26/2016   Mild persistent asthma 05/26/2016    Past Surgical History:  Procedure Laterality Date   REPLACEMENT TOTAL KNEE Left    TOE SURGERY         Family History  Adopted: Yes  Problem Relation Age of Onset   Allergic rhinitis Neg Hx    Angioedema Neg Hx    Asthma Neg Hx    Atopy Neg Hx    Eczema Neg Hx    Immunodeficiency Neg Hx    Urticaria Neg Hx     Social History   Tobacco Use   Smoking status: Some Days    Packs/day: 0.30    Years: 45.00    Pack years: 13.50    Types: Cigarettes    Start date: 05/16/1971   Smokeless tobacco: Never   Tobacco comments:    Decreased Intake to 10 cigarettes per week or less.  Vaping Use   Vaping Use: Never used  Substance Use Topics   Alcohol use: No   Drug use: No    Home Medications Prior  to Admission medications   Medication Sig Start Date End Date Taking? Authorizing Provider  albuterol (VENTOLIN HFA) 108 (90 Base) MCG/ACT inhaler Inhale 1-2 puffs into the lungs every 4 (four) hours as needed for wheezing or shortness of breath. 10/29/20   Willy Eddy, MD  amLODipine (NORVASC) 10 MG tablet Take 1 tablet (10 mg total) by mouth daily. 10/29/20   Willy Eddy, MD  atorvastatin (LIPITOR) 20 MG tablet Take 1 tablet (20 mg total) by mouth at bedtime. 10/29/20 12/28/20  Willy Eddy, MD  benztropine (COGENTIN) 1 MG tablet Take 1 tablet (1 mg total) by mouth 2 (two) times daily. 10/29/20   Willy Eddy, MD  clotrimazole (LOTRIMIN) 1 % cream  Apply to affected area 2 times daily 01/10/21   Alvira Monday, MD  desmopressin (DDAVP) 0.2 MG tablet Take 1 tablet (0.2 mg total) by mouth at bedtime. 10/29/20   Willy Eddy, MD  ferrous sulfate 325 (65 FE) MG tablet Take 1 tablet (325 mg total) by mouth every other day. 10/29/20   Willy Eddy, MD  gabapentin (NEURONTIN) 300 MG capsule Take 2 capsules (600 mg total) by mouth 3 (three) times daily. 10/29/20 11/28/20  Willy Eddy, MD  insulin aspart (NOVOLOG) 100 UNIT/ML injection Inject 9 Units into the skin 3 (three) times daily with meals. 10/29/20   Willy Eddy, MD  insulin glargine (LANTUS) 100 UNIT/ML injection Inject 0.4 mLs (40 Units total) into the skin daily. 10/29/20   Willy Eddy, MD  lisinopril (ZESTRIL) 10 MG tablet Take 1 tablet (10 mg total) by mouth daily. 10/29/20 11/28/20  Willy Eddy, MD  Melatonin 10 MG TABS Take 10 mg by mouth at bedtime. 10/29/20   Willy Eddy, MD  metformin (FORTAMET) 1000 MG (OSM) 24 hr tablet Take 1 tablet (1,000 mg total) by mouth daily with breakfast. 10/29/20   Willy Eddy, MD  miconazole [facility]) 2 % powder Apply topically in the morning and at bedtime. 11/28/20   Geoffery Lyons, MD  nicotine (NICODERM CQ) 21 mg/24hr patch Place 1 patch (21 mg total) onto the skin daily. 11/28/20   Geoffery Lyons, MD  OLANZapine (ZYPREXA) 10 MG tablet Take 1 tablet (10 mg total) by mouth in the morning and at bedtime. 10/29/20 11/28/20  Willy Eddy, MD  paliperidone (INVEGA) 9 MG 24 hr tablet Take 1 tablet (9 mg total) by mouth daily. 10/29/20   Willy Eddy, MD  pantoprazole (PROTONIX) 40 MG tablet Take 1 tablet (40 mg total) by mouth daily. 10/29/20   Willy Eddy, MD    Allergies    Chantix [varenicline tartrate], Ativan [lorazepam], Ibuprofen, Other, Pineapple, and Penicillins  Review of Systems   Review of Systems  Constitutional:  Negative for appetite change and fever.  HENT:  Negative for congestion.    Respiratory:  Negative for shortness of breath.   Cardiovascular:  Negative for chest pain.  Gastrointestinal:  Negative for abdominal pain, diarrhea, nausea and vomiting.  Genitourinary:  Negative for dysuria, flank pain, hematuria, penile pain, penile swelling, scrotal swelling and urgency.  Musculoskeletal:  Positive for back pain and myalgias. Negative for neck pain and neck stiffness.  Skin:  Negative for rash.  Allergic/Immunologic: Negative for immunocompromised state.  Neurological:  Negative for dizziness, syncope, weakness, numbness and headaches.  Psychiatric/Behavioral:  Negative for confusion.    Physical Exam Updated Vital Signs BP (!) 147/90 (BP Location: Right Arm)   Pulse 88   Temp 99.3 F (37.4 C) (Oral)   Resp 17   SpO2 98%  Physical Exam Vitals and nursing note reviewed.  Constitutional:      Appearance: He is well-developed.  HENT:     Head: Normocephalic.  Eyes:     Conjunctiva/sclera: Conjunctivae normal.  Cardiovascular:     Rate and Rhythm: Normal rate and regular rhythm.     Heart sounds: No murmur heard. Pulmonary:     Effort: Pulmonary effort is normal. No respiratory distress.     Breath sounds: No stridor. No wheezing, rhonchi or rales.  Chest:     Chest wall: No tenderness.  Abdominal:     General: There is no distension.     Palpations: Abdomen is soft.     Tenderness: There is no abdominal tenderness.  Musculoskeletal:     Cervical back: Neck supple.     Right lower leg: No edema.     Left lower leg: No edema.     Comments: No tenderness to palpation of the cervical, thoracic, or lumbar spinous processes.  Ambulates without difficulty.  No focal tenderness over the lateral legs.  He is neurovascular intact.  Skin:    General: Skin is warm and dry.  Neurological:     Mental Status: He is alert.  Psychiatric:        Attention and Perception: He does not perceive auditory or visual hallucinations.        Behavior: Behavior normal.         Thought Content: Thought content does not include homicidal or suicidal ideation. Thought content does not include homicidal or suicidal plan.     Comments: Does not appear to be responding to internal stimuli.    ED Results / Procedures / Treatments   Labs (all labs ordered are listed, but only abnormal results are displayed) Labs Reviewed  COMPREHENSIVE METABOLIC PANEL - Abnormal; Notable for the following components:      Result Value   CO2 21 (*)    Glucose, Bld 316 (*)    Calcium 8.8 (*)    Albumin 2.9 (*)    All other components within normal limits  CBC WITH DIFFERENTIAL/PLATELET - Abnormal; Notable for the following components:   RBC 4.18 (*)    Hemoglobin 11.1 (*)    HCT 35.7 (*)    All other components within normal limits  ACETAMINOPHEN LEVEL - Abnormal; Notable for the following components:   Acetaminophen (Tylenol), Serum <10 (*)    All other components within normal limits  SALICYLATE LEVEL - Abnormal; Notable for the following components:   Salicylate Lvl <7.0 (*)    All other components within normal limits  CBG MONITORING, ED - Abnormal; Notable for the following components:   Glucose-Capillary 296 (*)    All other components within normal limits  RESP PANEL BY RT-PCR (FLU A&B, COVID) ARPGX2  ETHANOL  RAPID URINE DRUG SCREEN, HOSP PERFORMED    EKG None  Radiology No results found.  Procedures Procedures   Medications Ordered in ED Medications - No data to display  ED Course  I have reviewed the triage vital signs and the nursing notes.  Pertinent labs & imaging results that were available during my care of the patient were reviewed by me and considered in my medical decision making (see chart for details).    MDM Rules/Calculators/A&P                           65 year old male with history of asthma, schizophrenia, diabetes mellitus, gout, hypertension presents the  emergency department with back and leg pain.  States that this is chronic.   No recent falls or injuries.  No red flags.  Vital signs are stable.  Patient was noted to be laughing at himself in triage, but denies SI, HI, or auditory visual hallucinations.  He does not appear to be responding to internal stimuli.  He does have hyperglycemia, but no evidence of DKA or HHS.  Labs are otherwise unremarkable.  His physical exam is reassuring.  He does have a history of malingering and homelessness.  I have a low suspicion for cauda equina, transverse myelitis, pyelonephritis, CVA, compartment syndrome, DVT, gout, or septic joint.  He can follow-up in the [facility].  He was encouraged to take his outpatient medications.  ER return precautions given.  Final Clinical Impression(s) / ED Diagnoses Final diagnoses:  Myalgia  Homelessness    Rx / DC Orders ED Discharge Orders     None        Barkley Boards, PA-C 02/02/21 1497    Glynn Octave, MD 02/02/21 [phone]

## 2021-02-02 NOTE — Discharge Instructions (Addendum)
Thank you for allowing me to care for you today in the Emergency Department.   Follow-up at the [facility] as needed.  Take your home medications as prescribed.  Return for new or worsening symptoms.

## 2021-02-03 ENCOUNTER — Encounter (HOSPITAL_COMMUNITY): Payer: Self-pay | Admitting: Emergency Medicine

## 2021-02-03 ENCOUNTER — Telehealth: Payer: Self-pay | Admitting: Family Medicine

## 2021-02-03 ENCOUNTER — Other Ambulatory Visit: Payer: Self-pay

## 2021-02-03 ENCOUNTER — Emergency Department (HOSPITAL_COMMUNITY)
Admission: EM | Admit: 2021-02-03 | Discharge: 2021-02-03 | Disposition: A | Discharge status: Home / Self Care | Payer: Medicaid Other | Attending: Emergency Medicine | Admitting: Emergency Medicine

## 2021-02-03 DIAGNOSIS — Z7984 Long term (current) use of oral hypoglycemic drugs: Secondary | ICD-10-CM | POA: Diagnosis not present

## 2021-02-03 DIAGNOSIS — Z880 Allergy status to penicillin: Secondary | ICD-10-CM | POA: Diagnosis not present

## 2021-02-03 DIAGNOSIS — Z96652 Presence of left artificial knee joint: Secondary | ICD-10-CM | POA: Diagnosis not present

## 2021-02-03 DIAGNOSIS — N1831 Chronic kidney disease, stage 3a: Secondary | ICD-10-CM | POA: Insufficient documentation

## 2021-02-03 DIAGNOSIS — Z0279 Encounter for issue of other medical certificate: Secondary | ICD-10-CM | POA: Diagnosis not present

## 2021-02-03 DIAGNOSIS — F17210 Nicotine dependence, cigarettes, uncomplicated: Secondary | ICD-10-CM | POA: Diagnosis not present

## 2021-02-03 DIAGNOSIS — Z79899 Other long term (current) drug therapy: Secondary | ICD-10-CM | POA: Insufficient documentation

## 2021-02-03 DIAGNOSIS — E1140 Type 2 diabetes mellitus with diabetic neuropathy, unspecified: Secondary | ICD-10-CM | POA: Diagnosis not present

## 2021-02-03 DIAGNOSIS — Z886 Allergy status to analgesic agent status: Secondary | ICD-10-CM | POA: Insufficient documentation

## 2021-02-03 DIAGNOSIS — R443 Hallucinations, unspecified: Secondary | ICD-10-CM | POA: Diagnosis not present

## 2021-02-03 DIAGNOSIS — J45909 Unspecified asthma, uncomplicated: Secondary | ICD-10-CM | POA: Diagnosis not present

## 2021-02-03 DIAGNOSIS — Z20822 Contact with and (suspected) exposure to covid-19: Secondary | ICD-10-CM | POA: Insufficient documentation

## 2021-02-03 DIAGNOSIS — Z794 Long term (current) use of insulin: Secondary | ICD-10-CM | POA: Insufficient documentation

## 2021-02-03 DIAGNOSIS — I129 Hypertensive chronic kidney disease with stage 1 through stage 4 chronic kidney disease, or unspecified chronic kidney disease: Secondary | ICD-10-CM | POA: Diagnosis not present

## 2021-02-03 DIAGNOSIS — Z008 Encounter for other general examination: Secondary | ICD-10-CM

## 2021-02-03 DIAGNOSIS — R4189 Other symptoms and signs involving cognitive functions and awareness: Secondary | ICD-10-CM | POA: Insufficient documentation

## 2021-02-03 LAB — CBC WITH DIFFERENTIAL/PLATELET
Abs Immature Granulocytes: 0 10*3/uL (ref 0.00–0.07)
Basophils Absolute: 0 10*3/uL (ref 0.0–0.1)
Basophils Relative: 1 %
Eosinophils Absolute: 0.1 10*3/uL (ref 0.0–0.5)
Eosinophils Relative: 2 %
HCT: 38.3 % — ABNORMAL LOW (ref 39.0–52.0)
Hemoglobin: 11.7 g/dL — ABNORMAL LOW (ref 13.0–17.0)
Immature Granulocytes: 0 %
Lymphocytes Relative: 37 %
Lymphs Abs: 1.9 10*3/uL (ref 0.7–4.0)
MCH: 26.5 pg (ref 26.0–34.0)
MCHC: 30.5 g/dL (ref 30.0–36.0)
MCV: 86.7 fL (ref 80.0–100.0)
Monocytes Absolute: 0.5 10*3/uL (ref 0.1–1.0)
Monocytes Relative: 9 %
Neutro Abs: 2.7 10*3/uL (ref 1.7–7.7)
Neutrophils Relative %: 51 %
Platelets: 306 10*3/uL (ref 150–400)
RBC: 4.42 MIL/uL (ref 4.22–5.81)
RDW: 13.8 % (ref 11.5–15.5)
WBC: 5.2 10*3/uL (ref 4.0–10.5)
nRBC: 0 % (ref 0.0–0.2)

## 2021-02-03 LAB — COMPREHENSIVE METABOLIC PANEL
ALT: 17 U/L (ref 0–44)
AST: 17 U/L (ref 15–41)
Albumin: 3.4 g/dL — ABNORMAL LOW (ref 3.5–5.0)
Alkaline Phosphatase: 77 U/L (ref 38–126)
Anion gap: 8 (ref 5–15)
BUN: 15 mg/dL (ref 8–23)
CO2: 24 mmol/L (ref 22–32)
Calcium: 9.2 mg/dL (ref 8.9–10.3)
Chloride: 108 mmol/L (ref 98–111)
Creatinine, Ser: 1.03 mg/dL (ref 0.61–1.24)
GFR, Estimated: >60 mL/min (ref >60)
Glucose, Bld: 140 mg/dL — ABNORMAL HIGH (ref 70–99)
Potassium: 4.2 mmol/L (ref 3.5–5.1)
Sodium: 140 mmol/L (ref 135–145)
Total Bilirubin: 0.3 mg/dL (ref 0.3–1.2)
Total Protein: 7.3 g/dL (ref 6.5–8.1)

## 2021-02-03 LAB — SARS CORONAVIRUS 2 (TAT 6-24 HRS): SARS Coronavirus 2: NEGATIVE

## 2021-02-03 LAB — RAPID URINE DRUG SCREEN, HOSP PERFORMED
Amphetamines: NOT DETECTED
Barbiturates: NOT DETECTED
Benzodiazepines: NOT DETECTED
Cocaine: NOT DETECTED
Opiates: NOT DETECTED
Tetrahydrocannabinol: NOT DETECTED

## 2021-02-03 LAB — ETHANOL: Alcohol, Ethyl (B): <10 mg/dL (ref <10)

## 2021-02-03 MED ORDER — PANTOPRAZOLE SODIUM 40 MG PO TBEC
40.0000 mg | DELAYED_RELEASE_TABLET | Freq: Every day | ORAL | Status: DC
  Administered 2021-02-03: 40 mg via ORAL
  Filled 2021-02-03: qty 1

## 2021-02-03 MED ORDER — METFORMIN HCL 500 MG PO TABS
500.0000 mg | ORAL_TABLET | Freq: Two times a day (BID) | ORAL | Status: DC
  Administered 2021-02-03: 500 mg via ORAL
  Filled 2021-02-03: qty 1

## 2021-02-03 MED ORDER — ZIPRASIDONE MESYLATE 20 MG IM SOLR: 20.0000 mg | Freq: Once | INTRAMUSCULAR | Status: DC

## 2021-02-03 MED ORDER — HALOPERIDOL 5 MG PO TABS
5.0000 mg | ORAL_TABLET | Freq: Once | ORAL | Status: AC
  Administered 2021-02-03: 5 mg via ORAL
  Filled 2021-02-03: qty 1

## 2021-02-03 NOTE — ED Triage Notes (Signed)
Pt coming from a family practice by [facility] security. Was at family practice claiming he had an appointment and was going up to pt in lobby. Family practice called security. Pt states he is here to see the dr

## 2021-02-03 NOTE — ED Notes (Signed)
Pt has been escorted out by security staff

## 2021-02-03 NOTE — Telephone Encounter (Signed)
Patient presented to family medicine center after discharge from emergency room.  Previously this clinic [facility]) was the patient's primary care provider.  Does not appear we are his primary care provider any longer.  The patient reported he had a visit today.  He requested a diabetes exam with me.  I discussed that I do not have clinic today nor am I accepting new patients.  I discussed with him that as it appears as though we are no longer PCP ----he would likely need to fill out a new patient packet. The patient was pleasant throughout the discussion.  He then refused to leave the waiting area and was asking patients to give him high-fives and walking around the waiting area.  I asked him again to leave.  He did not do so.  Security was called.  Security was able to escort the patient out of the building.   I also spoke to the patient's sister whose name is on file to see if she could pick him up today or help him.  She currently lives in [place].  She is willing to help him if she is able.  I strongly suspect the patient has been off his antipsychotic medications and could benefit from an inpatient behavioral health stay to stabilize his mental health condition.  He would also benefit from a social work consult when he next presents to the emergency department.  Will route to last SW who saw patient.   Routing to Environmental consultant so they are aware.  Terisa Starr, MD  Family Medicine Teaching Service
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
## 2021-02-03 NOTE — ED Notes (Signed)
Pt screaming profanities at staff and unwilling to participate in TTS assessment.
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
## 2021-02-03 NOTE — ED Provider Notes (Signed)
[facility] EMERGENCY DEPARTMENT Provider Note   CSN: [id number] Arrival date & time: 02/03/21  1103     History Chief Complaint  Patient presents with   Medical Clearance    Dylan Jordan is a 65 y.o. male presenting for medical clearance.  Level 5 caveat due to psychiatric condition.  Patient states he is here because he needs to see a doctor.  He states he is concerned about both his physical and mental health.  He will not elaborate further.  Denies SI or HI.  Does report he is having some hallucinations.  States he has not been on any medication for many months.  Additional history obtained from chart review.  This is patient's third ER visit in 24 hours for the same.  He was at a primary care clinic earlier today requesting to see a doctor, and this was sent to the ER for psychiatric evaluation.  HPI     Past Medical History:  Diagnosis Date   Anemia    Asthma    Diabetes mellitus without complication (HCC)    Gout    Hyperlipidemia    Hyperosmolar hyperglycemic coma due to diabetes mellitus without ketoacidosis (HCC)    Hypertension    Schizophrenia (HCC) 07/06/2018    Patient Active Problem List   Diagnosis Date Noted   Malingering 11/15/2020   Homelessness 09/05/2020   Stage 3a chronic kidney disease (HCC) 04/27/2020   Slow transit constipation 04/27/2020   Noncompliance with diabetes treatment 04/20/2020   Uncontrolled type 2 diabetes mellitus with hyperglycemia (HCC) 04/20/2020   Hyperkalemia, diminished renal excretion 03/29/2020   Hyperkalemia    Schizoaffective disorder, bipolar type (HCC) 02/21/2019   Hallucinations    Anxiety 09/18/2018   Swelling of both lower extremities 09/13/2018   Type 2 diabetes mellitus without complication, with long-term current use of insulin (HCC)    Anemia    Hyperlipidemia 12/18/2017   Mixed stress and urge urinary incontinence 11/25/2017   Intermittent chest pain 11/25/2017   Allergic rhinitis  with a nonallergic component 07/30/2017   Food allergy 07/30/2017   Chronic gout involving toe of left foot without tophus 05/18/2017   Hemorrhoids 05/04/2017   Chronic low back pain 01/03/2017   Chronic cough 06/30/2016   Drug-induced mood disorder (HCC) 06/14/2016   Type 2 diabetes mellitus with diabetic neuropathy (HCC) 05/26/2016   Iron deficiency anemia 05/26/2016   Chronic kidney disease 05/26/2016   HTN (hypertension) 05/26/2016   Tobacco abuse 05/26/2016   History of substance abuse (HCC) 05/26/2016   History of alcohol abuse 05/26/2016   Mild persistent asthma 05/26/2016    Past Surgical History:  Procedure Laterality Date   REPLACEMENT TOTAL KNEE Left    TOE SURGERY         Family History  Adopted: Yes  Problem Relation Age of Onset   Allergic rhinitis Neg Hx    Angioedema Neg Hx    Asthma Neg Hx    Atopy Neg Hx    Eczema Neg Hx    Immunodeficiency Neg Hx    Urticaria Neg Hx     Social History   Tobacco Use   Smoking status: Some Days    Packs/day: 0.30    Years: 45.00    Pack years: 13.50    Types: Cigarettes    Start date: 05/16/1971   Smokeless tobacco: Never   Tobacco comments:    Decreased Intake to 10 cigarettes per week or less.  Vaping Use   Vaping Use:  Never used  Substance Use Topics   Alcohol use: No   Drug use: No    Home Medications Prior to Admission medications   Medication Sig Start Date End Date Taking? Authorizing Provider  albuterol (VENTOLIN HFA) 108 (90 Base) MCG/ACT inhaler Inhale 1-2 puffs into the lungs every 4 (four) hours as needed for wheezing or shortness of breath. 10/29/20   Willy Eddy, MD  amLODipine (NORVASC) 10 MG tablet Take 1 tablet (10 mg total) by mouth daily. 10/29/20   Willy Eddy, MD  atorvastatin (LIPITOR) 20 MG tablet Take 1 tablet (20 mg total) by mouth at bedtime. 10/29/20 12/28/20  Willy Eddy, MD  benztropine (COGENTIN) 1 MG tablet Take 1 tablet (1 mg total) by mouth 2 (two) times daily.  10/29/20   Willy Eddy, MD  clotrimazole (LOTRIMIN) 1 % cream Apply to affected area 2 times daily 01/10/21   Alvira Monday, MD  desmopressin (DDAVP) 0.2 MG tablet Take 1 tablet (0.2 mg total) by mouth at bedtime. 10/29/20   Willy Eddy, MD  ferrous sulfate 325 (65 FE) MG tablet Take 1 tablet (325 mg total) by mouth every other day. 10/29/20   Willy Eddy, MD  gabapentin (NEURONTIN) 300 MG capsule Take 2 capsules (600 mg total) by mouth 3 (three) times daily. 10/29/20 11/28/20  Willy Eddy, MD  insulin aspart (NOVOLOG) 100 UNIT/ML injection Inject 9 Units into the skin 3 (three) times daily with meals. 10/29/20   Willy Eddy, MD  insulin glargine (LANTUS) 100 UNIT/ML injection Inject 0.4 mLs (40 Units total) into the skin daily. 10/29/20   Willy Eddy, MD  lisinopril (ZESTRIL) 10 MG tablet Take 1 tablet (10 mg total) by mouth daily. 10/29/20 11/28/20  Willy Eddy, MD  Melatonin 10 MG TABS Take 10 mg by mouth at bedtime. 10/29/20   Willy Eddy, MD  metformin (FORTAMET) 1000 MG (OSM) 24 hr tablet Take 1 tablet (1,000 mg total) by mouth daily with breakfast. 10/29/20   Willy Eddy, MD  miconazole [facility]) 2 % powder Apply topically in the morning and at bedtime. 11/28/20   Geoffery Lyons, MD  nicotine (NICODERM CQ) 21 mg/24hr patch Place 1 patch (21 mg total) onto the skin daily. 11/28/20   Geoffery Lyons, MD  OLANZapine (ZYPREXA) 10 MG tablet Take 1 tablet (10 mg total) by mouth in the morning and at bedtime. 10/29/20 11/28/20  Willy Eddy, MD  paliperidone (INVEGA) 9 MG 24 hr tablet Take 1 tablet (9 mg total) by mouth daily. 10/29/20   Willy Eddy, MD  pantoprazole (PROTONIX) 40 MG tablet Take 1 tablet (40 mg total) by mouth daily. 10/29/20   Willy Eddy, MD    Allergies    Chantix [varenicline tartrate], Ativan [lorazepam], Ibuprofen, Other, Pineapple, and Penicillins  Review of Systems   Review of Systems  Unable to perform ROS:  Psychiatric disorder  Psychiatric/Behavioral:  Positive for hallucinations.    Physical Exam Updated Vital Signs BP (!) 161/93 (BP Location: Left Arm)   Pulse 83   Temp 98.6 F (37 C)   Resp 16   SpO2 99%   Physical Exam Vitals and nursing note reviewed.  Constitutional:      General: He is not in acute distress.    Appearance: Normal appearance.  HENT:     Head: Normocephalic and atraumatic.  Eyes:     Conjunctiva/sclera: Conjunctivae normal.     Pupils: Pupils are equal, round, and reactive to light.  Cardiovascular:     Rate and Rhythm: Normal rate and  regular rhythm.     Pulses: Normal pulses.  Pulmonary:     Effort: Pulmonary effort is normal. No respiratory distress.     Breath sounds: Normal breath sounds. No wheezing.     Comments: Speaking in full sentences.  Clear lung sounds in all fields. Abdominal:     General: There is no distension.     Palpations: Abdomen is soft.     Tenderness: There is no abdominal tenderness.  Musculoskeletal:        General: Normal range of motion.     Cervical back: Normal range of motion and neck supple.  Skin:    General: Skin is warm and dry.     Capillary Refill: Capillary refill takes less than 2 seconds.  Neurological:     Mental Status: He is alert and oriented to person, place, and time.  Psychiatric:        Speech: Speech is rapid and pressured and tangential.     Comments: Odd affect. Tangential thoughts. Not responding to internal stimuli on my exam    ED Results / Procedures / Treatments   Labs (all labs ordered are listed, but only abnormal results are displayed) Labs Reviewed  COMPREHENSIVE METABOLIC PANEL - Abnormal; Notable for the following components:      Result Value   Glucose, Bld 140 (*)    Albumin 3.4 (*)    All other components within normal limits  CBC WITH DIFFERENTIAL/PLATELET - Abnormal; Notable for the following components:   Hemoglobin 11.7 (*)    HCT 38.3 (*)    All other components within  normal limits  SARS CORONAVIRUS 2 (TAT 6-24 HRS)  ETHANOL  RAPID URINE DRUG SCREEN, HOSP PERFORMED    EKG None  Radiology No results found.  Procedures Procedures   Medications Ordered in ED Medications - No data to display  ED Course  I have reviewed the triage vital signs and the nursing notes.  Pertinent labs & imaging results that were available during my care of the patient were reviewed by me and considered in my medical decision making (see chart for details).    MDM Rules/Calculators/A&P                           Pt presenting for medical clearance/psychiatric eval. On exam, pt has an odd affect, but does not appear to be a danger to himself or others. However previous provider concerned about pts mental health, and pt has not had a tts eval in a month. As such, will order TTS consult. Home meds ordered.   The patient has been placed in psychiatric observation due to the need to provide a safe environment for the patient while obtaining psychiatric consultation and evaluation, as well as ongoing medical and medication management to treat the patient's condition.  The patient has not been placed under full IVC at this time.  Final Clinical Impression(s) / ED Diagnoses Final diagnoses:  None    Rx / DC Orders ED Discharge Orders     None        Alveria Apley, PA-C 02/03/21 1504    Horton, Clabe Seal, DO 02/03/21 1512

## 2021-02-03 NOTE — ED Notes (Signed)
Belongings inventoried and placed in locker 8.  

## 2021-02-03 NOTE — ED Notes (Signed)
Pt has become increasingly loud, wild movements of arms, swinging at equipment and shouting verbal threats with profanity at staff.  Security and Dr. Lockie Mola at bedside.  MD asked if pt had suicidal or homicidal intentions or thoughts; pt denied both.  Pt belongings returned. Pt escorted out by security with [employer] present.

## 2021-02-03 NOTE — ED Provider Notes (Signed)
Emergency Medicine Provider Triage Evaluation Note  Dylan Jordan , a 65 y.o. male  was evaluated in triage.  Pt complains of medical clearance.  Review of Systems  Positive: Pain all over Negative: Level V caveat due to psych illness  Physical Exam  BP (!) 161/93 (BP Location: Left Arm)   Pulse 83   Temp 98.6 F (37 C)   Resp 16   SpO2 99%  Gen:   Awake, no distress   Resp:  Normal effort  MSK:   Moves extremities without difficulty  Other:  uncooperative  Medical Decision Making  Medically screening exam initiated at 11:14 AM.  Appropriate orders placed.  Dylan Jordan was informed that the remainder of the evaluation will be completed by another provider, this initial triage assessment does not replace that evaluation, and the importance of remaining in the ED until their evaluation is complete.  Pt went to family practice center today claiming he has an appointment to be seen by his doctor despite not having one.  PCP request security to take him to the ER for medical clearance.  Pt denies SI/HI/AVH. However Level V caveat due to psychiatric illness.   Fayrene Helper, PA-C 02/03/21 1118    [name], MD 02/06/21 [phone]

## 2021-02-08 ENCOUNTER — Other Ambulatory Visit: Payer: Self-pay

## 2021-02-08 ENCOUNTER — Emergency Department (HOSPITAL_COMMUNITY)
Admission: EM | Admit: 2021-02-08 | Discharge: 2021-02-09 | Disposition: A | Discharge status: Home / Self Care | Payer: Medicaid Other | Attending: Emergency Medicine | Admitting: Emergency Medicine

## 2021-02-08 DIAGNOSIS — J4530 Mild persistent asthma, uncomplicated: Secondary | ICD-10-CM | POA: Diagnosis not present

## 2021-02-08 DIAGNOSIS — Z79899 Other long term (current) drug therapy: Secondary | ICD-10-CM | POA: Diagnosis not present

## 2021-02-08 DIAGNOSIS — W010XXA Fall on same level from slipping, tripping and stumbling without subsequent striking against object, initial encounter: Secondary | ICD-10-CM | POA: Insufficient documentation

## 2021-02-08 DIAGNOSIS — W19XXXA Unspecified fall, initial encounter: Secondary | ICD-10-CM

## 2021-02-08 DIAGNOSIS — F17210 Nicotine dependence, cigarettes, uncomplicated: Secondary | ICD-10-CM | POA: Insufficient documentation

## 2021-02-08 DIAGNOSIS — E1122 Type 2 diabetes mellitus with diabetic chronic kidney disease: Secondary | ICD-10-CM | POA: Insufficient documentation

## 2021-02-08 DIAGNOSIS — J45909 Unspecified asthma, uncomplicated: Secondary | ICD-10-CM | POA: Insufficient documentation

## 2021-02-08 DIAGNOSIS — N1831 Chronic kidney disease, stage 3a: Secondary | ICD-10-CM | POA: Diagnosis not present

## 2021-02-08 DIAGNOSIS — Y92149 Unspecified place in prison as the place of occurrence of the external cause: Secondary | ICD-10-CM | POA: Insufficient documentation

## 2021-02-08 DIAGNOSIS — I129 Hypertensive chronic kidney disease with stage 1 through stage 4 chronic kidney disease, or unspecified chronic kidney disease: Secondary | ICD-10-CM | POA: Diagnosis not present

## 2021-02-08 DIAGNOSIS — Z7984 Long term (current) use of oral hypoglycemic drugs: Secondary | ICD-10-CM | POA: Diagnosis not present

## 2021-02-08 DIAGNOSIS — M79672 Pain in left foot: Secondary | ICD-10-CM | POA: Insufficient documentation

## 2021-02-08 DIAGNOSIS — Z794 Long term (current) use of insulin: Secondary | ICD-10-CM | POA: Diagnosis not present

## 2021-02-08 DIAGNOSIS — M545 Low back pain, unspecified: Secondary | ICD-10-CM | POA: Insufficient documentation

## 2021-02-08 DIAGNOSIS — M79604 Pain in right leg: Secondary | ICD-10-CM

## 2021-02-08 DIAGNOSIS — M79671 Pain in right foot: Secondary | ICD-10-CM | POA: Insufficient documentation

## 2021-02-08 DIAGNOSIS — M79605 Pain in left leg: Secondary | ICD-10-CM | POA: Insufficient documentation

## 2021-02-08 NOTE — ED Triage Notes (Signed)
Pt c/o groin pain and swelling extending into upper thighs with worsening pain in right leg after sustaining fall yesterday since then has had difficult ambulating.

## 2021-02-09 NOTE — ED Provider Notes (Signed)
[facility] EMERGENCY DEPARTMENT Provider Note   CSN: [id number] Arrival date & time: 02/08/21  2249     History Chief Complaint  Patient presents with   Marletta Lor    Dylan Jordan is a 65 y.o. male with a history of schizophrenia who presents the emergency department with a chief complaint of fall.  The patient reports that he slipped and fell yesterday while he was in jail.  He denies hitting his head or having loss of consciousness.  He is endorsing pain to his bilateral feet, legs, and lower back.  States that he slid to the ground.  He also states that his "groin is swollen", but denies penile or testicular pain or swelling.  When asked to point to the area that is swollen, he points to his bilateral thighs.  He denies numbness, weakness, dizziness, lightheadedness, chest pain, neck pain, abdominal pain, redness, swelling, bruises, or wounds.  No treatment prior to arrival.  He is well-known to this emergency department with 34 visits in the last 6 months.  He is also requesting food and drink prior to examination.  The history is provided by the patient and medical records. No language interpreter was used.      Past Medical History:  Diagnosis Date   Anemia    Asthma    Diabetes mellitus without complication (HCC)    Gout    Hyperlipidemia    Hyperosmolar hyperglycemic coma due to diabetes mellitus without ketoacidosis (HCC)    Hypertension    Schizophrenia (HCC) 07/06/2018    Patient Active Problem List   Diagnosis Date Noted   Malingering 11/15/2020   Homelessness 09/05/2020   Stage 3a chronic kidney disease (HCC) 04/27/2020   Slow transit constipation 04/27/2020   Noncompliance with diabetes treatment 04/20/2020   Uncontrolled type 2 diabetes mellitus with hyperglycemia (HCC) 04/20/2020   Hyperkalemia, diminished renal excretion 03/29/2020   Hyperkalemia    Schizoaffective disorder, bipolar type (HCC) 02/21/2019   Hallucinations    Anxiety  09/18/2018   Swelling of both lower extremities 09/13/2018   Type 2 diabetes mellitus without complication, with long-term current use of insulin (HCC)    Anemia    Hyperlipidemia 12/18/2017   Mixed stress and urge urinary incontinence 11/25/2017   Intermittent chest pain 11/25/2017   Allergic rhinitis with a nonallergic component 07/30/2017   Food allergy 07/30/2017   Chronic gout involving toe of left foot without tophus 05/18/2017   Hemorrhoids 05/04/2017   Chronic low back pain 01/03/2017   Chronic cough 06/30/2016   Drug-induced mood disorder (HCC) 06/14/2016   Type 2 diabetes mellitus with diabetic neuropathy (HCC) 05/26/2016   Iron deficiency anemia 05/26/2016   Chronic kidney disease 05/26/2016   HTN (hypertension) 05/26/2016   Tobacco abuse 05/26/2016   History of substance abuse (HCC) 05/26/2016   History of alcohol abuse 05/26/2016   Mild persistent asthma 05/26/2016    Past Surgical History:  Procedure Laterality Date   REPLACEMENT TOTAL KNEE Left    TOE SURGERY         Family History  Adopted: Yes  Problem Relation Age of Onset   Allergic rhinitis Neg Hx    Angioedema Neg Hx    Asthma Neg Hx    Atopy Neg Hx    Eczema Neg Hx    Immunodeficiency Neg Hx    Urticaria Neg Hx     Social History   Tobacco Use   Smoking status: Some Days    Packs/day: 0.30  Years: 45.00    Pack years: 13.50    Types: Cigarettes    Start date: 05/16/1971   Smokeless tobacco: Never   Tobacco comments:    Decreased Intake to 10 cigarettes per week or less.  Vaping Use   Vaping Use: Never used  Substance Use Topics   Alcohol use: No   Drug use: No    Home Medications Prior to Admission medications   Medication Sig Start Date End Date Taking? Authorizing Provider  albuterol (VENTOLIN HFA) 108 (90 Base) MCG/ACT inhaler Inhale 1-2 puffs into the lungs every 4 (four) hours as needed for wheezing or shortness of breath. 10/29/20   Willy Eddy, MD  amLODipine  (NORVASC) 10 MG tablet Take 1 tablet (10 mg total) by mouth daily. 10/29/20   Willy Eddy, MD  atorvastatin (LIPITOR) 20 MG tablet Take 1 tablet (20 mg total) by mouth at bedtime. 10/29/20 12/28/20  Willy Eddy, MD  benztropine (COGENTIN) 1 MG tablet Take 1 tablet (1 mg total) by mouth 2 (two) times daily. 10/29/20   Willy Eddy, MD  clotrimazole (LOTRIMIN) 1 % cream Apply to affected area 2 times daily 01/10/21   Alvira Monday, MD  desmopressin (DDAVP) 0.2 MG tablet Take 1 tablet (0.2 mg total) by mouth at bedtime. 10/29/20   Willy Eddy, MD  ferrous sulfate 325 (65 FE) MG tablet Take 1 tablet (325 mg total) by mouth every other day. 10/29/20   Willy Eddy, MD  gabapentin (NEURONTIN) 300 MG capsule Take 2 capsules (600 mg total) by mouth 3 (three) times daily. 10/29/20 11/28/20  Willy Eddy, MD  insulin aspart (NOVOLOG) 100 UNIT/ML injection Inject 9 Units into the skin 3 (three) times daily with meals. 10/29/20   Willy Eddy, MD  insulin glargine (LANTUS) 100 UNIT/ML injection Inject 0.4 mLs (40 Units total) into the skin daily. 10/29/20   Willy Eddy, MD  lisinopril (ZESTRIL) 10 MG tablet Take 1 tablet (10 mg total) by mouth daily. 10/29/20 11/28/20  Willy Eddy, MD  Melatonin 10 MG TABS Take 10 mg by mouth at bedtime. 10/29/20   Willy Eddy, MD  metformin (FORTAMET) 1000 MG (OSM) 24 hr tablet Take 1 tablet (1,000 mg total) by mouth daily with breakfast. 10/29/20   Willy Eddy, MD  miconazole [facility]) 2 % powder Apply topically in the morning and at bedtime. 11/28/20   Geoffery Lyons, MD  nicotine (NICODERM CQ) 21 mg/24hr patch Place 1 patch (21 mg total) onto the skin daily. 11/28/20   Geoffery Lyons, MD  OLANZapine (ZYPREXA) 10 MG tablet Take 1 tablet (10 mg total) by mouth in the morning and at bedtime. 10/29/20 11/28/20  Willy Eddy, MD  paliperidone (INVEGA) 9 MG 24 hr tablet Take 1 tablet (9 mg total) by mouth daily. 10/29/20   Willy Eddy, MD  pantoprazole (PROTONIX) 40 MG tablet Take 1 tablet (40 mg total) by mouth daily. 10/29/20   Willy Eddy, MD    Allergies    Chantix [varenicline tartrate], Ativan [lorazepam], Ibuprofen, Other, Pineapple, and Penicillins  Review of Systems   Review of Systems  Constitutional:  Negative for activity change, chills and fever.  HENT:  Negative for congestion and sore throat.   Eyes:  Negative for visual disturbance.  Respiratory:  Negative for shortness of breath and wheezing.   Cardiovascular:  Negative for chest pain and palpitations.  Gastrointestinal:  Negative for abdominal pain, diarrhea, nausea and vomiting.  Genitourinary:  Negative for penile discharge, penile pain, penile swelling, scrotal swelling, testicular pain  and urgency.  Musculoskeletal:  Positive for arthralgias, back pain, gait problem and myalgias. Negative for neck pain and neck stiffness.  Skin:  Negative for rash.  Neurological:  Negative for dizziness, syncope, weakness, light-headedness and numbness.   Physical Exam Updated Vital Signs BP 122/76 (BP Location: Left Arm)   Pulse 82   Temp 98.8 F (37.1 C) (Oral)   Resp 16   Ht 6\' 2"  (1.88 m)   Wt 98 kg   SpO2 100%   BMI 27.73 kg/m   Physical Exam Vitals and nursing note reviewed.  Constitutional:      General: He is not in acute distress.    Appearance: He is well-developed. He is not ill-appearing, toxic-appearing or diaphoretic.  HENT:     Head: Normocephalic.  Eyes:     Conjunctiva/sclera: Conjunctivae normal.  Cardiovascular:     Rate and Rhythm: Normal rate and regular rhythm.     Heart sounds: No murmur heard. Pulmonary:     Effort: Pulmonary effort is normal. No respiratory distress.     Breath sounds: No stridor. No wheezing, rhonchi or rales.  Chest:     Chest wall: No tenderness.  Abdominal:     General: There is no distension.     Palpations: Abdomen is soft. There is no mass.     Tenderness: There is no abdominal  tenderness. There is no right CVA tenderness, left CVA tenderness, guarding or rebound.     Hernia: No hernia is present.  Genitourinary:    Comments: Chaperoned exam with RN.  Normal exam of the penis.  No testicular swelling or redness.  No inguinal swelling or lymphadenopathy. Musculoskeletal:        General: No tenderness.     Cervical back: Neck supple.     Right lower leg: No edema.     Left lower leg: No edema.     Comments: Spine is nontender without crepitus or step-offs.  DP and PT pulses are 2+ and symmetric.  Sensations intact and equal throughout.  Good strength against resistance with dorsiflexion plantarflexion.  Patient ambulates independently.  Gait is not antalgic.  Full active range of motion of all joints of the bilateral lower extremities.  No swelling noted to the bilateral legs.  No redness or warmth.  Muscular compartments are soft.  Skin:    General: Skin is warm and dry.  Neurological:     Mental Status: He is alert.  Psychiatric:        Behavior: Behavior normal.    ED Results / Procedures / Treatments   Labs (all labs ordered are listed, but only abnormal results are displayed) Labs Reviewed - No data to display  EKG None  Radiology No results found.  Procedures Procedures   Medications Ordered in ED Medications - No data to display  ED Course  I have reviewed the triage vital signs and the nursing notes.  Pertinent labs & imaging results that were available during my care of the patient were reviewed by me and considered in my medical decision making (see chart for details).    MDM Rules/Calculators/A&P                           65 year old male with a history of schizophrenia who is well-known to this emergency department with 34 visits in the last 6 months.  Patient reports that he had a fall more than 24 hours ago where he slid to the  ground from standing.  He denies hitting her head or having loss of consciousness.  He is endorsing back  pain and bilateral leg pain.  He also initially states that he is having groin swelling, but when he points to the area, he points to his bilateral thighs.  Chaperoned exam with RN and there is no penile or testicular tenderness or swelling on exam.  Doubt epididymitis, transverse myelitis, spinal fracture, or occult fracture  He also has no focal tenderness on exam.  He is noted to be ambulating without difficulty.  Full active range of motion of all joints of the bilateral lower legs and he is neurovascular intact.  At this time, no further emergent work-up is indicated.  I have concerned that the patient is here for secondary gain of food and drink as he does have a history of homelessness.  He was provided with food and drink prior to discharge.  ER return precautions given.  Safer discharge at this time with outpatient follow-up.  Final Clinical Impression(s) / ED Diagnoses Final diagnoses:  Fall, initial encounter  Bilateral leg pain    Rx / DC Orders ED Discharge Orders     None        Barkley Boards, PA-C 02/09/21 0205    Zadie Rhine, MD 02/09/21 [phone]

## 2021-02-09 NOTE — Discharge Instructions (Addendum)
Thank you for allowing me to care for you today in the Emergency Department.   He can apply ice pack for 15 to 20 minutes up to 3-4 times a day for leg pain.  Stretch the muscles of your legs as your pain allows.  Take 650 mg of Tylenol or 600 mg of ibuprofen with food every 6 hours for pain.  You can alternate between these 2 medications every 3 hours if your pain returns.  For instance, you can take Tylenol at noon, followed by a dose of ibuprofen at 3, followed by second dose of Tylenol and 6.  Follow-up as needed for reevaluation with primary care.  Return to the emergency department if you become unable to walk, have another fall or injury where you pass out lose consciousness, or have other new, concerning symptoms.

## 2021-02-09 NOTE — ED Notes (Signed)
Pt observed ambulating to and from the bathroom in the lobby independently without assistance. Gait stable.

## 2021-02-09 NOTE — ED Notes (Signed)
Pt discharge from triage. Discharge instructions discussed with pt. Escorted by security as pt refused discharge.

## 2021-08-30 ENCOUNTER — Other Ambulatory Visit: Payer: Self-pay

## 2021-08-30 ENCOUNTER — Encounter (HOSPITAL_COMMUNITY): Payer: Self-pay

## 2021-08-30 ENCOUNTER — Emergency Department (HOSPITAL_COMMUNITY)
Admission: EM | Admit: 2021-08-30 | Discharge: 2021-08-31 | Disposition: A | Discharge status: Home / Self Care | Payer: Medicare Other | Attending: Emergency Medicine | Admitting: Emergency Medicine

## 2021-08-30 DIAGNOSIS — J45909 Unspecified asthma, uncomplicated: Secondary | ICD-10-CM | POA: Diagnosis not present

## 2021-08-30 DIAGNOSIS — E1122 Type 2 diabetes mellitus with diabetic chronic kidney disease: Secondary | ICD-10-CM | POA: Diagnosis not present

## 2021-08-30 DIAGNOSIS — I129 Hypertensive chronic kidney disease with stage 1 through stage 4 chronic kidney disease, or unspecified chronic kidney disease: Secondary | ICD-10-CM | POA: Diagnosis not present

## 2021-08-30 DIAGNOSIS — R451 Restlessness and agitation: Secondary | ICD-10-CM | POA: Diagnosis present

## 2021-08-30 DIAGNOSIS — Z794 Long term (current) use of insulin: Secondary | ICD-10-CM | POA: Diagnosis not present

## 2021-08-30 DIAGNOSIS — Z5900 Homelessness unspecified: Secondary | ICD-10-CM | POA: Insufficient documentation

## 2021-08-30 DIAGNOSIS — F259 Schizoaffective disorder, unspecified: Secondary | ICD-10-CM

## 2021-08-30 DIAGNOSIS — Z79899 Other long term (current) drug therapy: Secondary | ICD-10-CM | POA: Insufficient documentation

## 2021-08-30 DIAGNOSIS — F250 Schizoaffective disorder, bipolar type: Secondary | ICD-10-CM | POA: Diagnosis not present

## 2021-08-30 DIAGNOSIS — N1831 Chronic kidney disease, stage 3a: Secondary | ICD-10-CM | POA: Diagnosis not present

## 2021-08-30 DIAGNOSIS — E1165 Type 2 diabetes mellitus with hyperglycemia: Secondary | ICD-10-CM | POA: Insufficient documentation

## 2021-08-30 DIAGNOSIS — Z7984 Long term (current) use of oral hypoglycemic drugs: Secondary | ICD-10-CM | POA: Diagnosis not present

## 2021-08-30 LAB — CBG MONITORING, ED: Glucose-Capillary: 226 mg/dL — ABNORMAL HIGH (ref 70–99)
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
## 2021-08-30 NOTE — ED Provider Triage Note (Signed)
?  Emergency Medicine Provider Triage Evaluation Note ? ?MRN:  [id number]  ?Arrival date & time: 08/30/21    ?Medically screening exam initiated at 11:18 PM.   ?CC:   ?Hyperglycemia and Homeless ?  ?HPI:  ?Dylan Jordan is a 66 y.o. year-old male presents to the ED with chief complaint of needing to have his cast removed (supposed to follow-up with [facility] between 4/17 and 5/1), needs to have cataract surgery, and states that his blood sugar has been high. ? ?History provided by History provided by patient. ?ROS:  ?-As included in HPI ?PE:  ? ?Vitals:  ? 08/30/21 2317  ?BP: (!) 157/92  ?Pulse: 90  ?Resp: 16  ?Temp: 98.8 ?F (37.1 ?C)  ?  ?Non-toxic appearing ?No respiratory distress ?Right lower leg cast, abrasions to knees ?MDM:  ?Based on signs and symptoms, housing instability and access to medical care/follow-up seems to be the primary problems tonight. ?I've ordered labs in triage to expedite lab/diagnostic workup. ? ?Patient was informed that the remainder of the evaluation will be completed by another provider, this initial triage assessment does not replace that evaluation, and the importance of remaining in the ED until their evaluation is complete. ? ?  ?Roxy Horseman, PA-C ?08/30/21 2321 ? ?

## 2021-08-31 DIAGNOSIS — F250 Schizoaffective disorder, bipolar type: Secondary | ICD-10-CM | POA: Diagnosis not present

## 2021-08-31 LAB — BASIC METABOLIC PANEL
Anion gap: 10 (ref 5–15)
BUN: 32 mg/dL — ABNORMAL HIGH (ref 8–23)
CO2: 23 mmol/L (ref 22–32)
Calcium: 9.5 mg/dL (ref 8.9–10.3)
Chloride: 107 mmol/L (ref 98–111)
Creatinine, Ser: 1.83 mg/dL — ABNORMAL HIGH (ref 0.61–1.24)
GFR, Estimated: 40 mL/min — ABNORMAL LOW (ref >60)
Glucose, Bld: 209 mg/dL — ABNORMAL HIGH (ref 70–99)
Potassium: 4.3 mmol/L (ref 3.5–5.1)
Sodium: 140 mmol/L (ref 135–145)

## 2021-08-31 LAB — CBC WITH DIFFERENTIAL/PLATELET
Abs Immature Granulocytes: 0.02 10*3/uL (ref 0.00–0.07)
Basophils Absolute: 0 10*3/uL (ref 0.0–0.1)
Basophils Relative: 0 %
Eosinophils Absolute: 0.2 10*3/uL (ref 0.0–0.5)
Eosinophils Relative: 2 %
HCT: 40.3 % (ref 39.0–52.0)
Hemoglobin: 12.3 g/dL — ABNORMAL LOW (ref 13.0–17.0)
Immature Granulocytes: 0 %
Lymphocytes Relative: 42 %
Lymphs Abs: 2.9 10*3/uL (ref 0.7–4.0)
MCH: 26.2 pg (ref 26.0–34.0)
MCHC: 30.5 g/dL (ref 30.0–36.0)
MCV: 85.7 fL (ref 80.0–100.0)
Monocytes Absolute: 0.6 10*3/uL (ref 0.1–1.0)
Monocytes Relative: 8 %
Neutro Abs: 3.2 10*3/uL (ref 1.7–7.7)
Neutrophils Relative %: 48 %
Platelets: 327 10*3/uL (ref 150–400)
RBC: 4.7 MIL/uL (ref 4.22–5.81)
RDW: 14.5 % (ref 11.5–15.5)
WBC: 6.9 10*3/uL (ref 4.0–10.5)
nRBC: 0 % (ref 0.0–0.2)

## 2021-08-31 LAB — CBG MONITORING, ED: Glucose-Capillary: 183 mg/dL — ABNORMAL HIGH (ref 70–99)

## 2021-08-31 NOTE — BH Assessment (Signed)
[facility] Assessment Progress Note ? ?Per Shuvon Rankin, NP, this pt does not require psychiatric hospitalization at this time.  Pt is psychiatrically cleared.  Discharge instructions advise pt to follow up with the [facility] ACT Team if he is still a client of theirs, or with one of several other ACT Team providers in the community.  Pt's nurse, [place], has been notified. ? ?Jalene Mullet, MA ?Triage Specialist ?[phone] ? ?
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
## 2021-08-31 NOTE — ED Notes (Signed)
Pt refused to let this RN draw his blood or swab his nose. Pt stated you "need to get out of my face and get me some "GD" breakfast." This RN informed the EDP. Will continue to monitor.  ?
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
## 2021-08-31 NOTE — Discharge Instructions (Addendum)
Take your medicine and follow-up with your doctors as recommended.  ? ?For your behavioral health needs you are advised to follow up with an ACT Team.  If you are still a [facility] client, contact them at your earliest opportunity: ? ?     [facility] ?     [employer], [address] ?     [address] ?     [place], [place]  [address] ?     [phone] ?     Crisis number: [phone]  ? ?If you are no longer receiving [facility] services, contact them or one of these ACT Team providers at your earliest opportunity to ask about enrolling in their program: ? ?     [facility] ?     [address] ?     [place], [place] [address] ?     [phone] ? ?     [name] ?     [address] ?     [place], [place] [address] ?     [phone] ? ?     [facility] ?     [address] ?     [place], [place] [address] ?     [phone] ? ?     [facility] ?     [address] ?     [place], [place] [address] ?     [phone]  ?

## 2021-09-01 ENCOUNTER — Emergency Department (HOSPITAL_COMMUNITY)
Admission: EM | Admit: 2021-09-01 | Discharge: 2021-09-01 | Disposition: A | Discharge status: Home / Self Care | Payer: Medicare Other | Attending: Emergency Medicine | Admitting: Emergency Medicine

## 2021-09-01 ENCOUNTER — Encounter (HOSPITAL_COMMUNITY): Payer: Self-pay

## 2021-09-01 ENCOUNTER — Emergency Department (HOSPITAL_COMMUNITY)
Admission: EM | Admit: 2021-09-01 | Discharge: 2021-09-02 | Discharge status: Against Medical Advice | Payer: Medicare Other | Attending: Emergency Medicine | Admitting: Emergency Medicine

## 2021-09-01 ENCOUNTER — Other Ambulatory Visit: Payer: Self-pay

## 2021-09-01 DIAGNOSIS — Z5321 Procedure and treatment not carried out due to patient leaving prior to being seen by health care provider: Secondary | ICD-10-CM | POA: Insufficient documentation

## 2021-09-01 DIAGNOSIS — Z7984 Long term (current) use of oral hypoglycemic drugs: Secondary | ICD-10-CM | POA: Diagnosis not present

## 2021-09-01 DIAGNOSIS — E119 Type 2 diabetes mellitus without complications: Secondary | ICD-10-CM | POA: Insufficient documentation

## 2021-09-01 DIAGNOSIS — Z794 Long term (current) use of insulin: Secondary | ICD-10-CM | POA: Insufficient documentation

## 2021-09-01 DIAGNOSIS — Z760 Encounter for issue of repeat prescription: Secondary | ICD-10-CM | POA: Diagnosis present

## 2021-09-01 DIAGNOSIS — H5710 Ocular pain, unspecified eye: Secondary | ICD-10-CM | POA: Insufficient documentation

## 2021-09-01 LAB — CBG MONITORING, ED: Glucose-Capillary: 152 mg/dL — ABNORMAL HIGH (ref 70–99)

## 2021-09-01 MED ORDER — METFORMIN HCL 500 MG PO TABS
1000.0000 mg | ORAL_TABLET | Freq: Once | ORAL | Status: AC
  Administered 2021-09-01: 1000 mg via ORAL
  Filled 2021-09-01: qty 2

## 2021-09-01 MED ORDER — METFORMIN HCL ER (OSM) 1000 MG PO TB24: 1000.0000 mg | ORAL_TABLET | Freq: Every day | ORAL | 1 refills | Status: AC

## 2021-09-01 NOTE — ED Notes (Addendum)
Pt refused d/c vitals.

## 2021-09-01 NOTE — ED Triage Notes (Signed)
Pt BIB EMS with complaints of eye pain that has been happening all of his life.  ?

## 2021-09-01 NOTE — ED Triage Notes (Signed)
Per EMS- Patient is homeless. Patient called EMS because he thought his sugar was low. CBG- was 192. ?Patient also reports that he needs a metformin refill and that he has not eaten in 3 days. ?
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
## 2021-09-01 NOTE — ED Notes (Signed)
Pt is refusing vitals and states he will lets [place] take them once he gets a chicken breast ?

## 2021-09-01 NOTE — ED Notes (Signed)
I provided reinforced discharge education based off of discharge instructions. Pt acknowledged and understood my education. Pt had no further questions/concerns for provider/myself.  °

## 2021-09-01 NOTE — ED Notes (Addendum)
Pt refuse to be seen in a triage room state he is not a kid ?
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
## 2021-09-01 NOTE — ED Provider Notes (Signed)
?[facility]-EMERGENCY DEPT ?Provider Note ? ? ?CSN: [id number] ?Arrival date & time: 09/01/21  0754 ? ?  ? ?History ? ?Chief Complaint  ?Patient presents with  ? Medication Refill  ? ? ?Dylan Jordan is a 66 y.o. male. ? ?HPI ? ?  ? ?Pt comes in w/ cc of medication refill. ?Patient has history of diabetes and mental illness. ?He comes in via EMS with concerns for low sugar.  Blood glucose however is 190.  Patient states that he has not eaten anything in 3 days and wants something for his hunger.  He denies any dizziness, lightheadedness, sweating, nausea, vomiting. ? ? ?Home Medications ?Prior to Admission medications   ?Medication Sig Start Date End Date Taking? Authorizing Provider  ?albuterol (VENTOLIN HFA) 108 (90 Base) MCG/ACT inhaler Inhale 1-2 puffs into the lungs every 4 (four) hours as needed for wheezing or shortness of breath. 10/29/20   Willy Eddy, MD  ?amLODipine (NORVASC) 10 MG tablet Take 1 tablet (10 mg total) by mouth daily. 10/29/20   Willy Eddy, MD  ?atorvastatin (LIPITOR) 20 MG tablet Take 1 tablet (20 mg total) by mouth at bedtime. 10/29/20 12/28/20  Willy Eddy, MD  ?benztropine (COGENTIN) 1 MG tablet Take 1 tablet (1 mg total) by mouth 2 (two) times daily. 10/29/20   Willy Eddy, MD  ?clotrimazole (LOTRIMIN) 1 % cream Apply to affected area 2 times daily 01/10/21   Alvira Monday, MD  ?desmopressin (DDAVP) 0.2 MG tablet Take 1 tablet (0.2 mg total) by mouth at bedtime. 10/29/20   Willy Eddy, MD  ?ferrous sulfate 325 (65 FE) MG tablet Take 1 tablet (325 mg total) by mouth every other day. 10/29/20   Willy Eddy, MD  ?gabapentin (NEURONTIN) 300 MG capsule Take 2 capsules (600 mg total) by mouth 3 (three) times daily. 10/29/20 11/28/20  Willy Eddy, MD  ?insulin aspart (NOVOLOG) 100 UNIT/ML injection Inject 9 Units into the skin 3 (three) times daily with meals. 10/29/20   Willy Eddy, MD  ?insulin glargine (LANTUS) 100 UNIT/ML  injection Inject 0.4 mLs (40 Units total) into the skin daily. 10/29/20   Willy Eddy, MD  ?lisinopril (ZESTRIL) 10 MG tablet Take 1 tablet (10 mg total) by mouth daily. 10/29/20 11/28/20  Willy Eddy, MD  ?Melatonin 10 MG TABS Take 10 mg by mouth at bedtime. 10/29/20   Willy Eddy, MD  ?metformin (FORTAMET) 1000 MG (OSM) 24 hr tablet Take 1 tablet (1,000 mg total) by mouth daily with breakfast. 09/01/21   Derwood Kaplan, MD  ?miconazole (DESENEX) 2 % powder Apply topically in the morning and at bedtime. 11/28/20   Geoffery Lyons, MD  ?nicotine (NICODERM CQ) 21 mg/24hr patch Place 1 patch (21 mg total) onto the skin daily. 11/28/20   Geoffery Lyons, MD  ?OLANZapine (ZYPREXA) 10 MG tablet Take 1 tablet (10 mg total) by mouth in the morning and at bedtime. 10/29/20 11/28/20  Willy Eddy, MD  ?paliperidone (INVEGA) 9 MG 24 hr tablet Take 1 tablet (9 mg total) by mouth daily. 10/29/20   Willy Eddy, MD  ?pantoprazole (PROTONIX) 40 MG tablet Take 1 tablet (40 mg total) by mouth daily. 10/29/20   Willy Eddy, MD  ?   ? ?Allergies    ?Chantix [varenicline tartrate], Ativan [lorazepam], Ibuprofen, Other, Pineapple, and Penicillins   ? ?Review of Systems   ?Review of Systems ? ?Physical Exam ?Updated Vital Signs ?BP (!) 143/86   Pulse 84   Temp 98.3 ?F (36.8 ?C) (Oral)   Resp 18  Ht 6\' 8"  (2.032 m)   Wt 79.8 kg   SpO2 95%   BMI 19.33 kg/m?  ?Physical Exam ?Vitals and nursing note reviewed.  ?Constitutional:   ?   Appearance: He is well-developed.  ?HENT:  ?   Head: Atraumatic.  ?Cardiovascular:  ?   Rate and Rhythm: Normal rate.  ?Pulmonary:  ?   Effort: Pulmonary effort is normal.  ?Musculoskeletal:  ?   Cervical back: Neck supple.  ?Skin: ?   General: Skin is warm.  ?Neurological:  ?   Mental Status: He is alert and oriented to person, place, and time.  ? ? ?ED Results / Procedures / Treatments   ?Labs ?(all labs ordered are listed, but only abnormal results are displayed) ?Labs Reviewed   ?CBG MONITORING, ED - Abnormal; Notable for the following components:  ?    Result Value  ? Glucose-Capillary 152 (*)   ? All other components within normal limits  ? ? ?EKG ?None ? ?Radiology ?No results found. ? ?Procedures ?Procedures  ? ? ?Medications Ordered in ED ?Medications  ?metFORMIN (GLUCOPHAGE) tablet 1,000 mg (has no administration in time range)  ? ? ?ED Course/ Medical Decision Making/ A&P ?  ?                        ?Medical Decision Making ?Risk ?Prescription drug management. ? ? ?66 year old patient comes in with chief complaint of low blood sugar. ? ?He indicates that he has not eaten in few days and thought that his sugar was low.  He called EMS, but his sugar is noted to be normal. ? ?I have given him his metformin 1000 mg.  He states that he needs a refill for it. ? ?He has no systemic symptoms that are concerning. ? ?The social determinant of health in his case is homelessness.  I reviewed patient's record, he actually was just discharged yesterday, therefore it is not true that he has not had anything to eat or drink in few days.  I do not think any blood work is indicated at this time. ? ?He does have mental illness, but at this time he does not appear to be psychiatric unstable or imminent danger or threat to himself. ?Stable for discharge. ? ?Final Clinical Impression(s) / ED Diagnoses ?Final diagnoses:  ?Medication refill  ? ? ?Rx / DC Orders ?ED Discharge Orders   ? ?      Ordered  ?  metformin (FORTAMET) 1000 MG (OSM) 24 hr tablet  Daily with breakfast       ? 09/01/21 0847  ? ?  ?  ? ?  ? ? ?  ?09/03/21, MD ?09/01/21 1115 ? ?

## 2021-09-02 NOTE — ED Notes (Signed)
Patient is refusing treatment and walking out of the room to be able to see the doctor. ?

## 2022-06-04 IMAGING — DX DG KNEE COMPLETE 4+V*R*
4 series · 4 of 4 positions shown · non-contrast
Comparison: Plain films right knee 09/24/2020.

CLINICAL DATA: Chronic right knee pain. History of right knee
replacement.

EXAM:
RIGHT KNEE - COMPLETE 4+ VIEW

[knee ap]
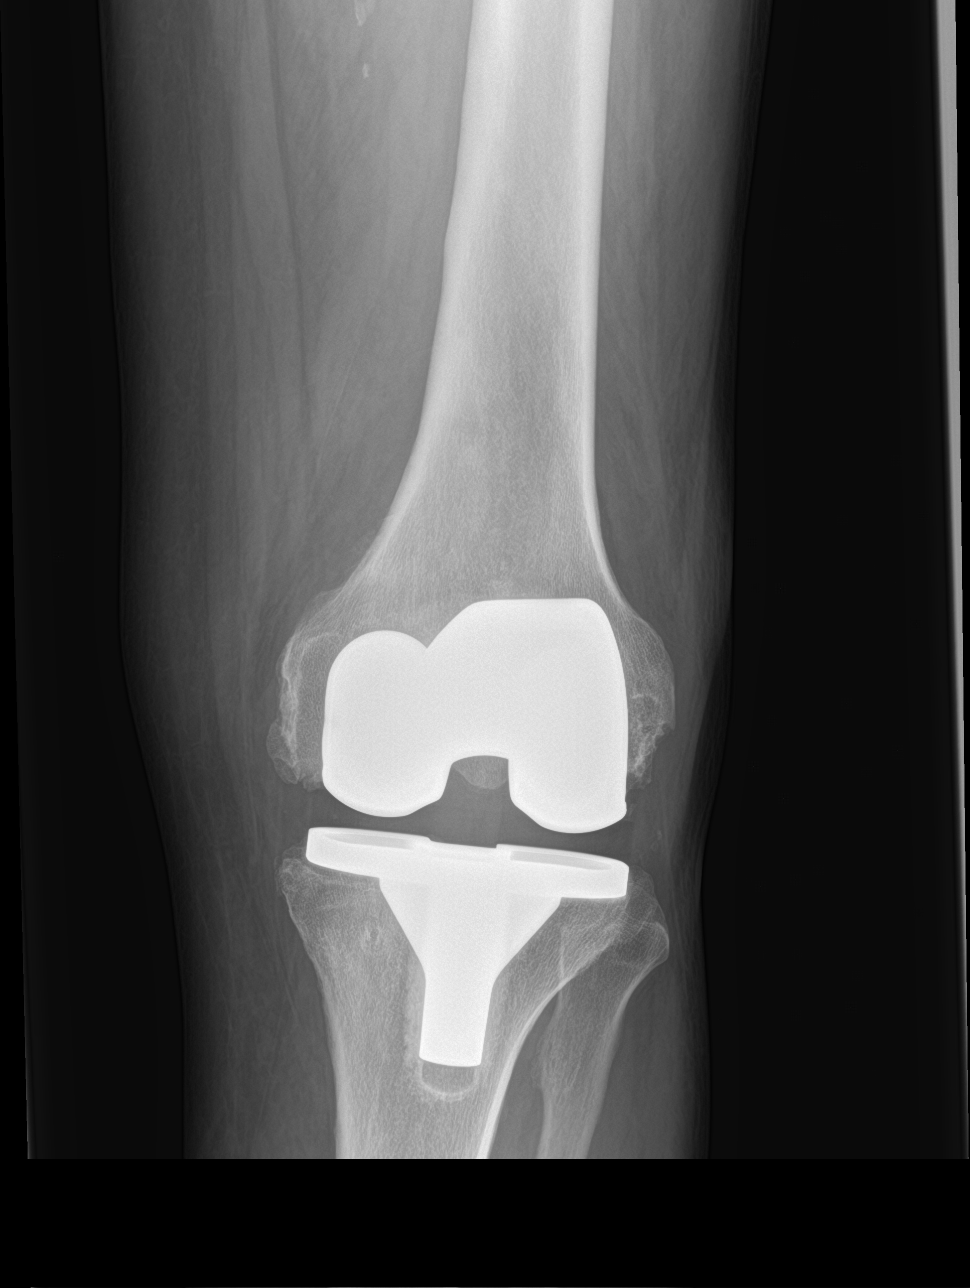

[knee lat]
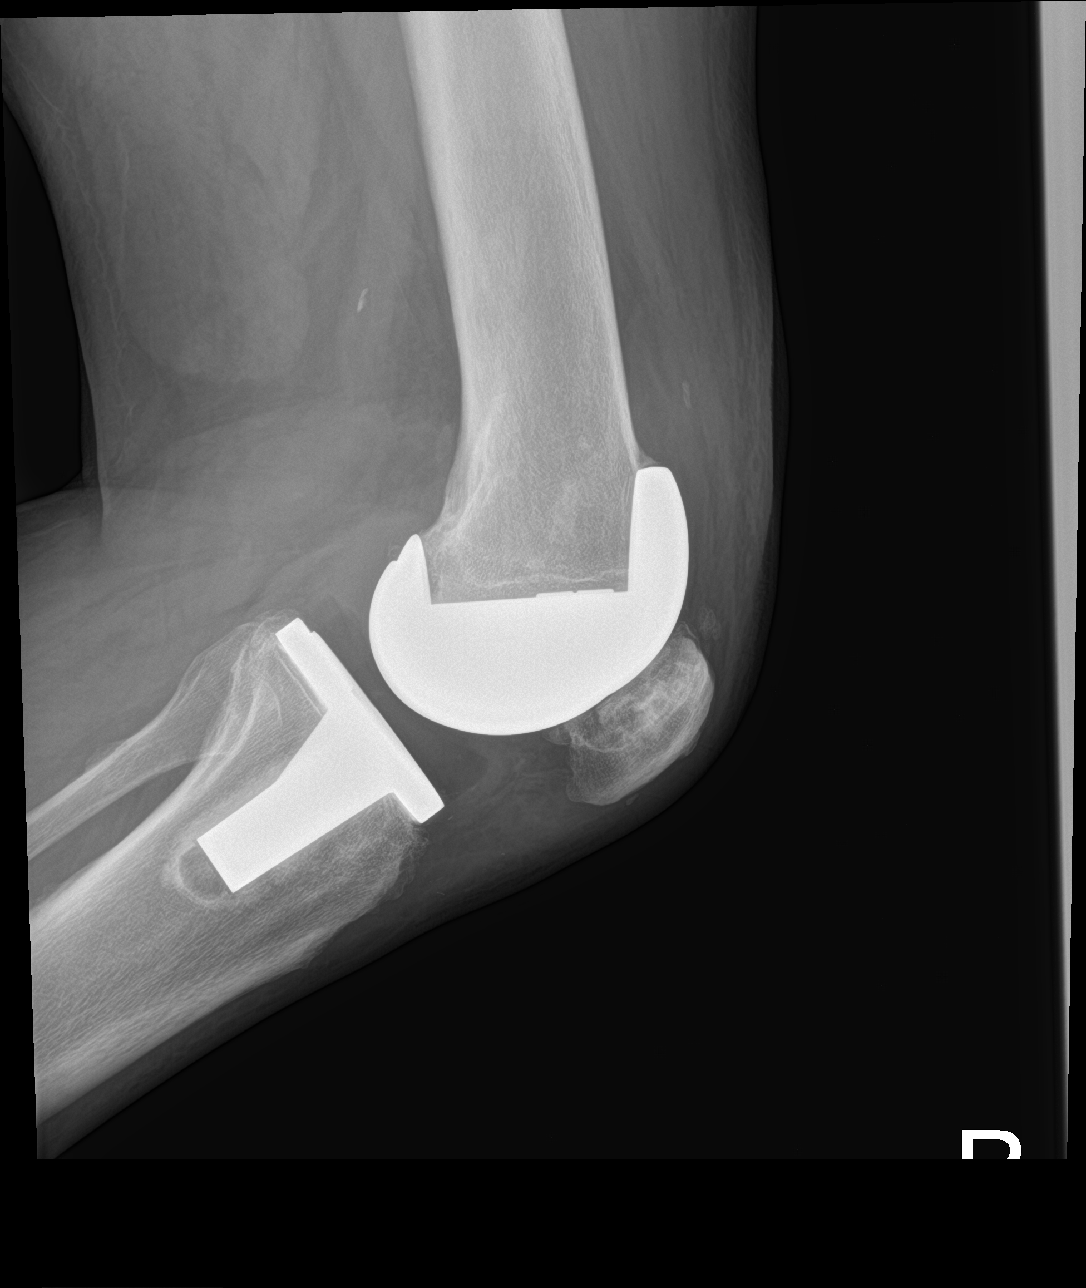

[knee obl (1 of 2)]
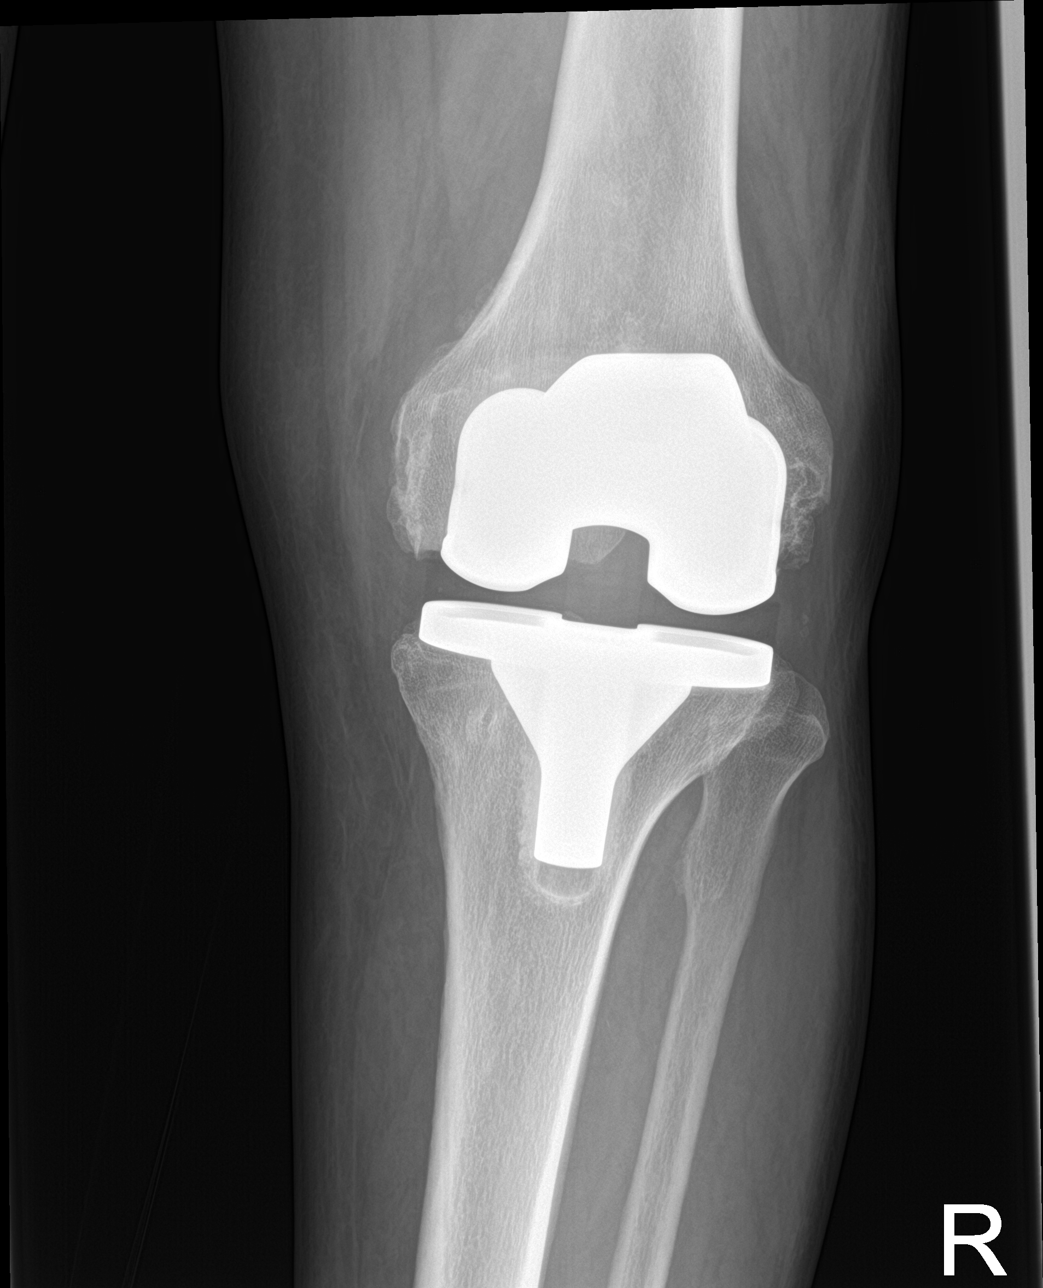

[knee obl (2 of 2)]
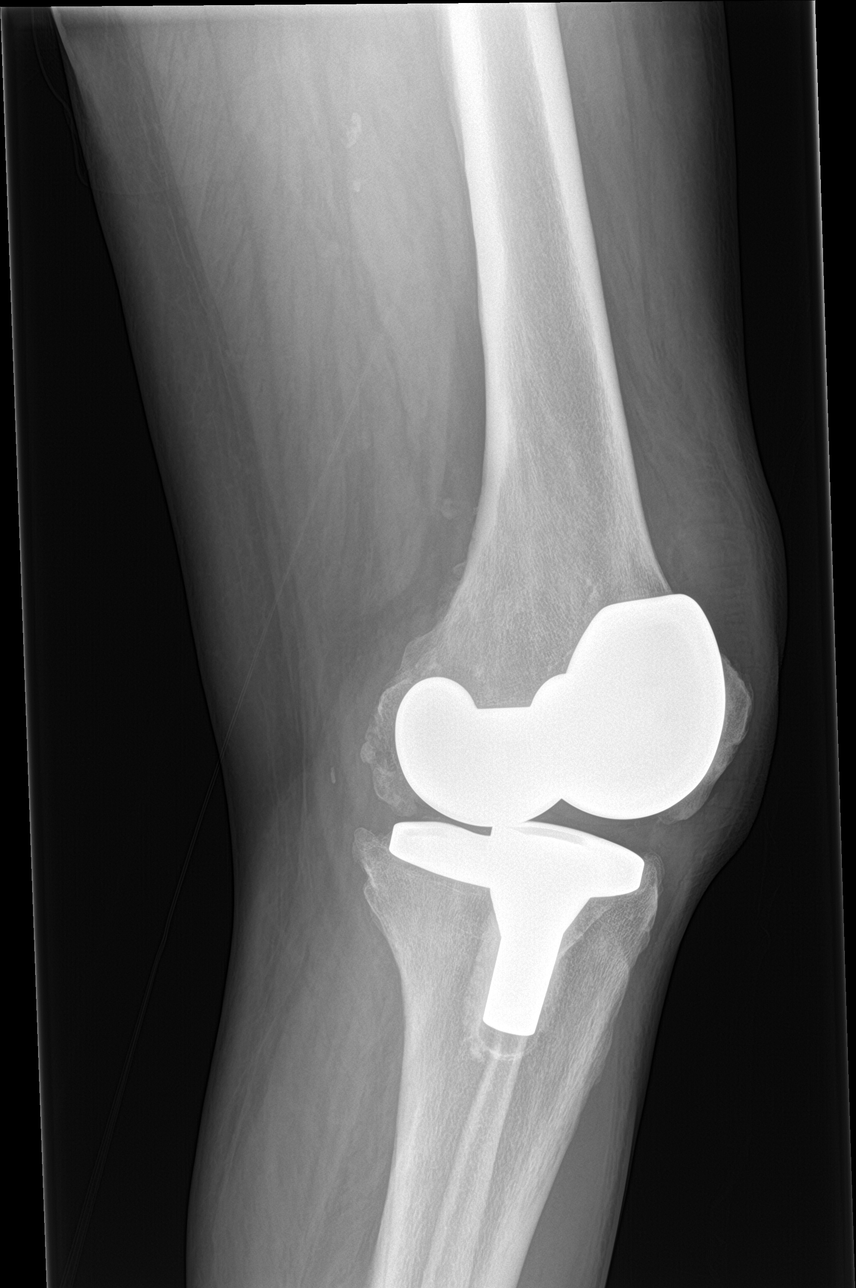

[4 of 4 positions shown; findings below may reference images not displayed]

FINDINGS: The patient has undergone right knee replacement since the prior
examination. No hardware complication is identified. No bony or
joint abnormality is seen. Soft tissues are negative.
IMPRESSION: No acute abnormality or finding to explain the patient's symptoms.
Right knee arthroplasty in place.

## 2022-06-06 IMAGING — DX DG FOOT COMPLETE 3+V*R*
3 series · 3 of 3 positions shown · non-contrast
Comparison: April 17, 2017

CLINICAL DATA: Chronic right greater than left foot pain, no known
injury

EXAM:
RIGHT FOOT COMPLETE - 3+ VIEW

[foot ap]
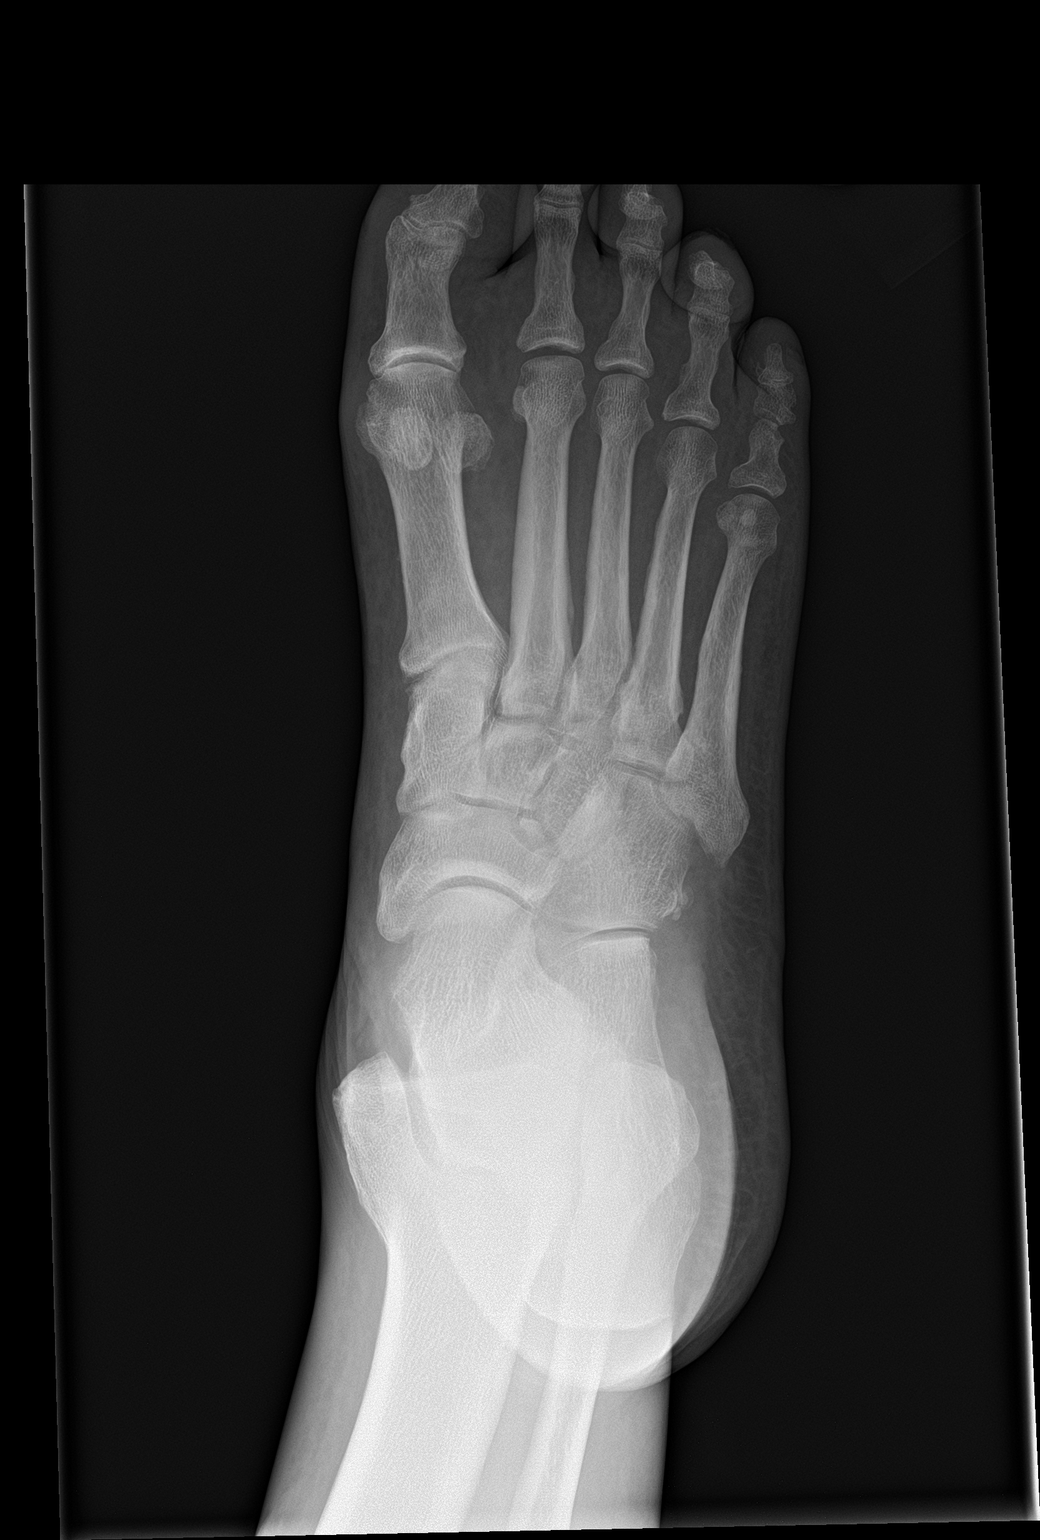

[foot obl]
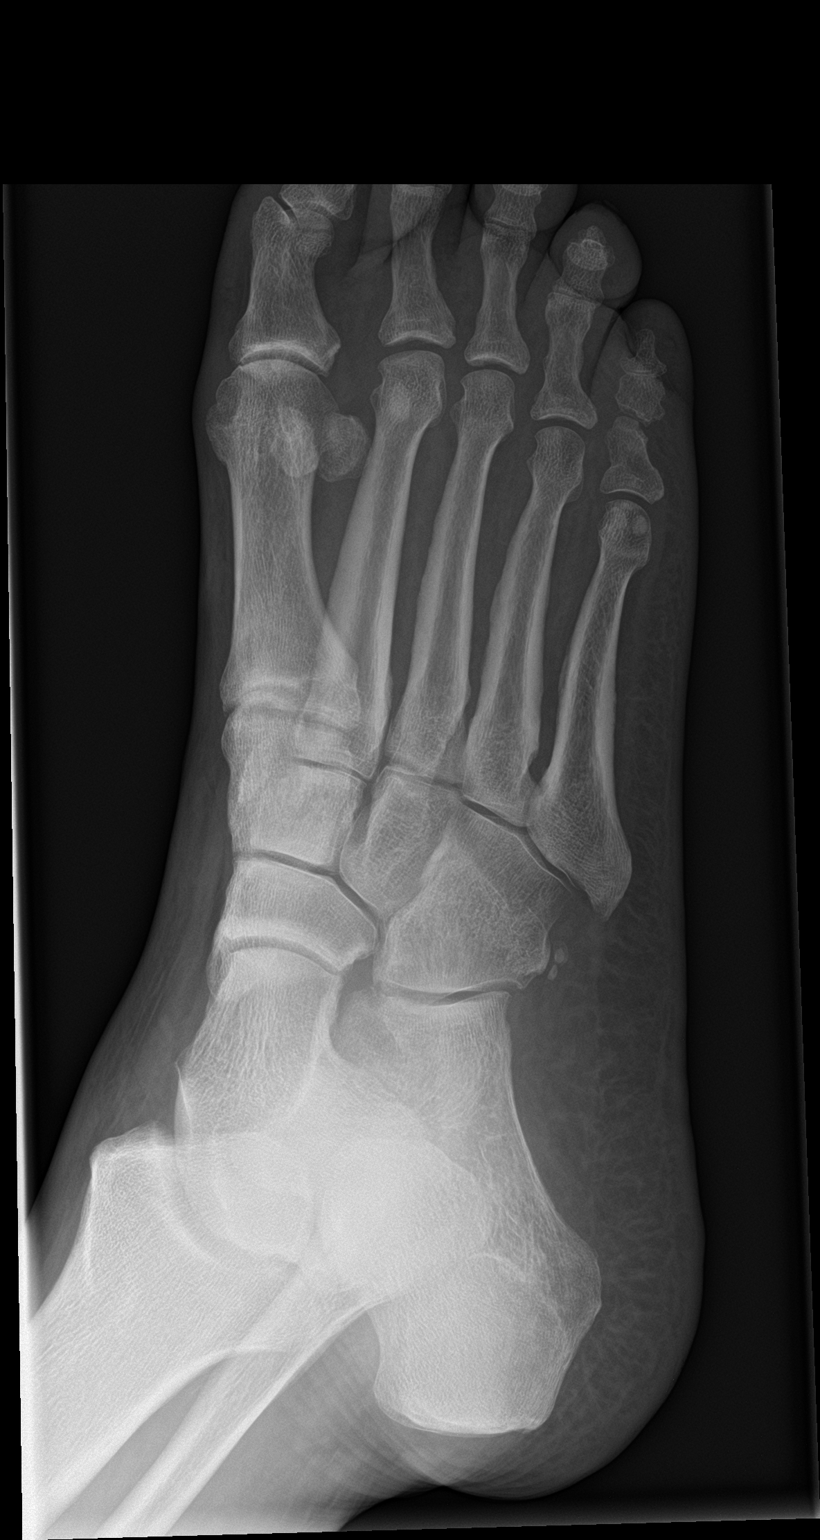

[foot lat]
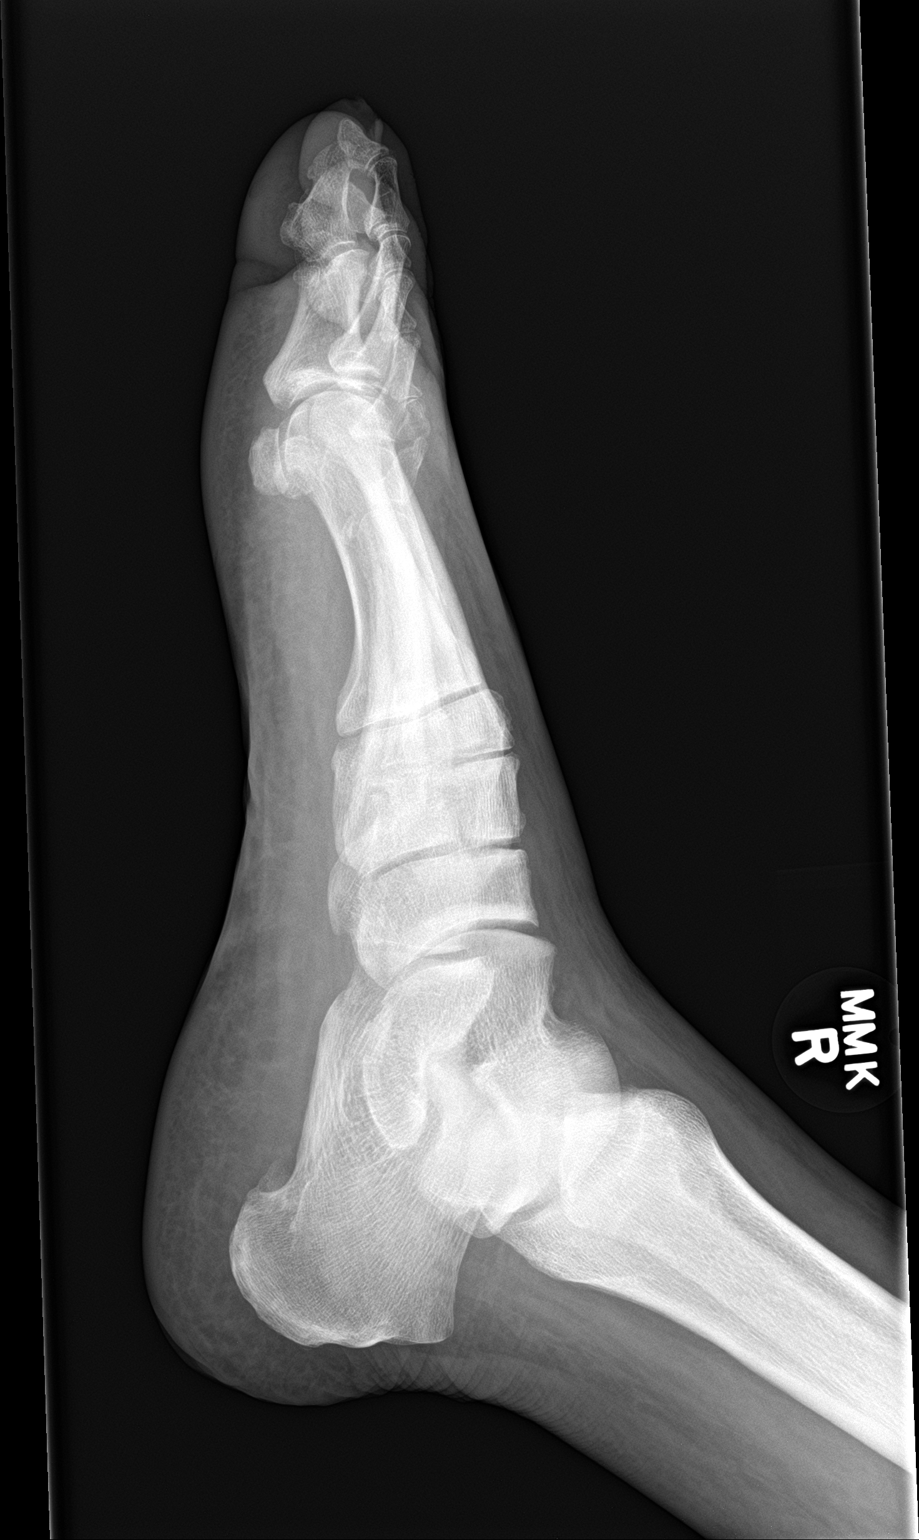

[3 of 3 positions shown; findings below may reference images not displayed]

FINDINGS: There is no evidence of fracture or dislocation. Pes planus and
hallux valgus. Mild first digit and dorsal midfoot degenerative
change. Soft tissues are unremarkable.
IMPRESSION: No acute osseous abnormality.

Mild degenerative change of the dorsal midfoot and first digit.

Pes planus and hallux valgus.
